# Patient Record
Sex: Female | Born: 1945
Health system: Southern US, Community
[De-identification: ages and names within clinical notes are randomized; demographics above are authoritative.]

## PROBLEM LIST (undated history)

## (undated) DIAGNOSIS — G8929 Other chronic pain: Secondary | ICD-10-CM

## (undated) DIAGNOSIS — R3915 Urgency of urination: Secondary | ICD-10-CM

## (undated) DIAGNOSIS — M179 Osteoarthritis of knee, unspecified: Secondary | ICD-10-CM

## (undated) DIAGNOSIS — K589 Irritable bowel syndrome without diarrhea: Secondary | ICD-10-CM

## (undated) DIAGNOSIS — C50919 Malignant neoplasm of unspecified site of unspecified female breast: Secondary | ICD-10-CM

## (undated) DIAGNOSIS — F32A Depression, unspecified: Secondary | ICD-10-CM

## (undated) DIAGNOSIS — R531 Weakness: Secondary | ICD-10-CM

## (undated) DIAGNOSIS — R609 Edema, unspecified: Secondary | ICD-10-CM

## (undated) DIAGNOSIS — R0600 Dyspnea, unspecified: Secondary | ICD-10-CM

## (undated) DIAGNOSIS — U071 COVID-19: Secondary | ICD-10-CM

## (undated) DIAGNOSIS — Z9221 Personal history of antineoplastic chemotherapy: Secondary | ICD-10-CM

## (undated) DIAGNOSIS — R600 Localized edema: Secondary | ICD-10-CM

## (undated) DIAGNOSIS — R0902 Hypoxemia: Secondary | ICD-10-CM

## (undated) DIAGNOSIS — M549 Dorsalgia, unspecified: Secondary | ICD-10-CM

## (undated) DIAGNOSIS — Z923 Personal history of irradiation: Secondary | ICD-10-CM

## (undated) DIAGNOSIS — G629 Polyneuropathy, unspecified: Secondary | ICD-10-CM

## (undated) DIAGNOSIS — Z9981 Dependence on supplemental oxygen: Secondary | ICD-10-CM

## (undated) DIAGNOSIS — L950 Livedoid vasculitis: Secondary | ICD-10-CM

## (undated) DIAGNOSIS — R351 Nocturia: Secondary | ICD-10-CM

## (undated) DIAGNOSIS — I2510 Atherosclerotic heart disease of native coronary artery without angina pectoris: Secondary | ICD-10-CM

## (undated) DIAGNOSIS — F329 Major depressive disorder, single episode, unspecified: Secondary | ICD-10-CM

## (undated) DIAGNOSIS — K219 Gastro-esophageal reflux disease without esophagitis: Secondary | ICD-10-CM

## (undated) DIAGNOSIS — J189 Pneumonia, unspecified organism: Secondary | ICD-10-CM

## (undated) DIAGNOSIS — L570 Actinic keratosis: Secondary | ICD-10-CM

## (undated) DIAGNOSIS — L821 Other seborrheic keratosis: Secondary | ICD-10-CM

## (undated) DIAGNOSIS — M1710 Unilateral primary osteoarthritis, unspecified knee: Secondary | ICD-10-CM

## (undated) DIAGNOSIS — M2540 Effusion, unspecified joint: Secondary | ICD-10-CM

## (undated) DIAGNOSIS — Z87442 Personal history of urinary calculi: Secondary | ICD-10-CM

## (undated) DIAGNOSIS — F419 Anxiety disorder, unspecified: Secondary | ICD-10-CM

## (undated) DIAGNOSIS — M2550 Pain in unspecified joint: Secondary | ICD-10-CM

## (undated) DIAGNOSIS — R350 Frequency of micturition: Secondary | ICD-10-CM

## (undated) DIAGNOSIS — G4733 Obstructive sleep apnea (adult) (pediatric): Secondary | ICD-10-CM

## (undated) DIAGNOSIS — I10 Essential (primary) hypertension: Secondary | ICD-10-CM

## (undated) DIAGNOSIS — R51 Headache: Secondary | ICD-10-CM

## (undated) DIAGNOSIS — G4730 Sleep apnea, unspecified: Secondary | ICD-10-CM

## (undated) DIAGNOSIS — K635 Polyp of colon: Secondary | ICD-10-CM

## (undated) DIAGNOSIS — R519 Headache, unspecified: Secondary | ICD-10-CM

## (undated) DIAGNOSIS — E785 Hyperlipidemia, unspecified: Secondary | ICD-10-CM

## (undated) DIAGNOSIS — J45909 Unspecified asthma, uncomplicated: Secondary | ICD-10-CM

## (undated) DIAGNOSIS — M069 Rheumatoid arthritis, unspecified: Secondary | ICD-10-CM

## (undated) HISTORY — DX: Essential (primary) hypertension: I10

## (undated) HISTORY — PX: SHOULDER ARTHROSCOPY: SHX128

## (undated) HISTORY — DX: Dependence on supplemental oxygen: Z99.81

## (undated) HISTORY — DX: Major depressive disorder, single episode, unspecified: F32.9

## (undated) HISTORY — DX: Hyperlipidemia, unspecified: E78.5

## (undated) HISTORY — DX: Actinic keratosis: L57.0

## (undated) HISTORY — DX: Unilateral primary osteoarthritis, unspecified knee: M17.10

## (undated) HISTORY — DX: Gastro-esophageal reflux disease without esophagitis: K21.9

## (undated) HISTORY — DX: Livedoid vasculitis: L95.0

## (undated) HISTORY — PX: TOTAL KNEE ARTHROPLASTY: SHX125

## (undated) HISTORY — DX: Other chronic pain: G89.29

## (undated) HISTORY — DX: Polyp of colon: K63.5

## (undated) HISTORY — PX: WRIST SURGERY: SHX841

## (undated) HISTORY — DX: Dyspnea, unspecified: R06.00

## (undated) HISTORY — DX: Sleep apnea, unspecified: G47.30

## (undated) HISTORY — PX: COLONOSCOPY: SHX174

## (undated) HISTORY — PX: FOOT SURGERY: SHX648

## (undated) HISTORY — DX: Hypoxemia: R09.02

## (undated) HISTORY — DX: Unspecified asthma, uncomplicated: J45.909

## (undated) HISTORY — PX: JOINT REPLACEMENT: SHX530

## (undated) HISTORY — PX: TOTAL SHOULDER ARTHROPLASTY: SHX126

## (undated) HISTORY — DX: Obstructive sleep apnea (adult) (pediatric): G47.33

## (undated) HISTORY — DX: Anxiety disorder, unspecified: F41.9

## (undated) HISTORY — DX: Osteoarthritis of knee, unspecified: M17.9

## (undated) HISTORY — PX: OTHER SURGICAL HISTORY: SHX169

## (undated) HISTORY — DX: Atherosclerotic heart disease of native coronary artery without angina pectoris: I25.10

## (undated) HISTORY — DX: Other seborrheic keratosis: L82.1

## (undated) HISTORY — DX: Malignant neoplasm of unspecified site of unspecified female breast: C50.919

## (undated) HISTORY — PX: LITHOTRIPSY: SUR834

## (undated) HISTORY — DX: Depression, unspecified: F32.A

---

## 1995-07-26 HISTORY — PX: BREAST LUMPECTOMY: SHX2

## 1998-02-05 ENCOUNTER — Ambulatory Visit (HOSPITAL_COMMUNITY): Admission: RE | Admit: 1998-02-05 | Discharge: 1998-02-05 | Payer: Self-pay | Admitting: Hematology & Oncology

## 1998-10-14 ENCOUNTER — Ambulatory Visit (HOSPITAL_COMMUNITY): Admission: RE | Admit: 1998-10-14 | Discharge: 1998-10-14 | Payer: Self-pay | Admitting: Hematology & Oncology

## 1999-07-30 ENCOUNTER — Other Ambulatory Visit: Admission: RE | Admit: 1999-07-30 | Discharge: 1999-07-30 | Payer: Self-pay | Admitting: Obstetrics and Gynecology

## 1999-10-08 ENCOUNTER — Encounter: Payer: Self-pay | Admitting: General Surgery

## 1999-10-08 ENCOUNTER — Encounter: Admission: RE | Admit: 1999-10-08 | Discharge: 1999-10-08 | Payer: Self-pay | Admitting: General Surgery

## 1999-11-29 ENCOUNTER — Other Ambulatory Visit: Admission: RE | Admit: 1999-11-29 | Discharge: 1999-11-29 | Payer: Self-pay | Admitting: Obstetrics and Gynecology

## 1999-11-29 ENCOUNTER — Encounter (INDEPENDENT_AMBULATORY_CARE_PROVIDER_SITE_OTHER): Payer: Self-pay | Admitting: Specialist

## 2000-01-31 ENCOUNTER — Encounter: Payer: Self-pay | Admitting: Hematology & Oncology

## 2000-01-31 ENCOUNTER — Encounter: Admission: RE | Admit: 2000-01-31 | Discharge: 2000-01-31 | Payer: Self-pay | Admitting: Hematology & Oncology

## 2000-10-09 ENCOUNTER — Encounter: Admission: RE | Admit: 2000-10-09 | Discharge: 2000-10-09 | Payer: Self-pay | Admitting: General Surgery

## 2000-10-09 ENCOUNTER — Encounter: Payer: Self-pay | Admitting: General Surgery

## 2000-10-19 ENCOUNTER — Other Ambulatory Visit: Admission: RE | Admit: 2000-10-19 | Discharge: 2000-10-19 | Payer: Self-pay | Admitting: Obstetrics and Gynecology

## 2001-01-01 ENCOUNTER — Ambulatory Visit (HOSPITAL_COMMUNITY): Admission: RE | Admit: 2001-01-01 | Discharge: 2001-01-01 | Payer: Self-pay | Admitting: Hematology & Oncology

## 2001-01-01 ENCOUNTER — Encounter: Payer: Self-pay | Admitting: Hematology & Oncology

## 2001-05-16 ENCOUNTER — Encounter (INDEPENDENT_AMBULATORY_CARE_PROVIDER_SITE_OTHER): Payer: Self-pay

## 2001-05-16 ENCOUNTER — Other Ambulatory Visit: Admission: RE | Admit: 2001-05-16 | Discharge: 2001-05-16 | Payer: Self-pay | Admitting: Obstetrics and Gynecology

## 2001-06-26 ENCOUNTER — Encounter: Payer: Self-pay | Admitting: Hematology & Oncology

## 2001-06-26 ENCOUNTER — Encounter: Admission: RE | Admit: 2001-06-26 | Discharge: 2001-06-26 | Payer: Self-pay | Admitting: Hematology & Oncology

## 2001-07-19 ENCOUNTER — Encounter: Admission: RE | Admit: 2001-07-19 | Discharge: 2001-07-19 | Payer: Self-pay | Admitting: Hematology & Oncology

## 2001-07-19 ENCOUNTER — Encounter: Payer: Self-pay | Admitting: Hematology & Oncology

## 2001-08-10 ENCOUNTER — Encounter: Admission: RE | Admit: 2001-08-10 | Discharge: 2001-08-10 | Payer: Self-pay | Admitting: Hematology & Oncology

## 2001-08-10 ENCOUNTER — Encounter: Payer: Self-pay | Admitting: Hematology & Oncology

## 2001-08-24 ENCOUNTER — Encounter: Payer: Self-pay | Admitting: Hematology & Oncology

## 2001-08-24 ENCOUNTER — Encounter: Admission: RE | Admit: 2001-08-24 | Discharge: 2001-08-24 | Payer: Self-pay | Admitting: Hematology & Oncology

## 2001-10-26 ENCOUNTER — Encounter: Payer: Self-pay | Admitting: General Surgery

## 2001-10-26 ENCOUNTER — Encounter: Admission: RE | Admit: 2001-10-26 | Discharge: 2001-10-26 | Payer: Self-pay | Admitting: General Surgery

## 2002-01-14 ENCOUNTER — Other Ambulatory Visit: Admission: RE | Admit: 2002-01-14 | Discharge: 2002-01-14 | Payer: Self-pay | Admitting: Obstetrics and Gynecology

## 2002-02-20 ENCOUNTER — Encounter: Admission: RE | Admit: 2002-02-20 | Discharge: 2002-02-20 | Payer: Self-pay | Admitting: Internal Medicine

## 2002-02-20 ENCOUNTER — Encounter (HOSPITAL_BASED_OUTPATIENT_CLINIC_OR_DEPARTMENT_OTHER): Payer: Self-pay | Admitting: Internal Medicine

## 2002-06-18 ENCOUNTER — Encounter: Payer: Self-pay | Admitting: Hematology & Oncology

## 2002-06-18 ENCOUNTER — Encounter: Admission: RE | Admit: 2002-06-18 | Discharge: 2002-06-18 | Payer: Self-pay | Admitting: Hematology & Oncology

## 2002-09-09 ENCOUNTER — Ambulatory Visit (HOSPITAL_COMMUNITY): Admission: RE | Admit: 2002-09-09 | Discharge: 2002-09-09 | Payer: Self-pay | Admitting: Internal Medicine

## 2002-09-19 ENCOUNTER — Ambulatory Visit (HOSPITAL_COMMUNITY): Admission: RE | Admit: 2002-09-19 | Discharge: 2002-09-19 | Payer: Self-pay | Admitting: Cardiology

## 2002-10-31 ENCOUNTER — Encounter: Admission: RE | Admit: 2002-10-31 | Discharge: 2002-10-31 | Payer: Self-pay | Admitting: General Surgery

## 2002-10-31 ENCOUNTER — Encounter: Payer: Self-pay | Admitting: General Surgery

## 2003-02-14 ENCOUNTER — Other Ambulatory Visit: Admission: RE | Admit: 2003-02-14 | Discharge: 2003-02-14 | Payer: Self-pay | Admitting: Obstetrics and Gynecology

## 2003-07-26 HISTORY — PX: CORONARY ARTERY BYPASS GRAFT: SHX141

## 2003-11-28 ENCOUNTER — Encounter: Admission: RE | Admit: 2003-11-28 | Discharge: 2003-11-28 | Payer: Self-pay | Admitting: General Surgery

## 2004-02-05 ENCOUNTER — Ambulatory Visit (HOSPITAL_COMMUNITY): Admission: RE | Admit: 2004-02-05 | Discharge: 2004-02-05 | Payer: Self-pay | Admitting: Internal Medicine

## 2004-03-12 ENCOUNTER — Other Ambulatory Visit: Admission: RE | Admit: 2004-03-12 | Discharge: 2004-03-12 | Payer: Self-pay | Admitting: Obstetrics and Gynecology

## 2004-04-23 ENCOUNTER — Inpatient Hospital Stay (HOSPITAL_COMMUNITY): Admission: AD | Admit: 2004-04-23 | Discharge: 2004-05-02 | Payer: Self-pay | Admitting: Cardiology

## 2004-04-23 HISTORY — PX: CARDIAC CATHETERIZATION: SHX172

## 2004-06-07 ENCOUNTER — Encounter (HOSPITAL_COMMUNITY): Admission: RE | Admit: 2004-06-07 | Discharge: 2004-09-05 | Payer: Self-pay | Admitting: Cardiology

## 2004-08-20 ENCOUNTER — Ambulatory Visit: Payer: Self-pay | Admitting: Hematology & Oncology

## 2004-12-10 ENCOUNTER — Encounter: Admission: RE | Admit: 2004-12-10 | Discharge: 2004-12-10 | Payer: Self-pay | Admitting: General Surgery

## 2005-04-27 HISTORY — PX: OTHER SURGICAL HISTORY: SHX169

## 2005-06-28 ENCOUNTER — Other Ambulatory Visit: Admission: RE | Admit: 2005-06-28 | Discharge: 2005-06-28 | Payer: Self-pay | Admitting: Obstetrics and Gynecology

## 2005-08-18 ENCOUNTER — Ambulatory Visit: Payer: Self-pay | Admitting: Hematology & Oncology

## 2005-12-16 ENCOUNTER — Encounter: Admission: RE | Admit: 2005-12-16 | Discharge: 2005-12-16 | Payer: Self-pay | Admitting: General Surgery

## 2006-08-25 ENCOUNTER — Ambulatory Visit: Payer: Self-pay | Admitting: Gastroenterology

## 2006-09-08 ENCOUNTER — Ambulatory Visit: Payer: Self-pay | Admitting: Gastroenterology

## 2006-09-12 ENCOUNTER — Ambulatory Visit: Payer: Self-pay | Admitting: Hematology & Oncology

## 2006-09-15 LAB — CBC WITH DIFFERENTIAL/PLATELET
BASO%: 0.4 % (ref 0.0–2.0)
EOS%: 1.1 % (ref 0.0–7.0)
HCT: 40.4 % (ref 34.8–46.6)
LYMPH%: 29.5 % (ref 14.0–48.0)
MCH: 30.6 pg (ref 26.0–34.0)
MCHC: 34.4 g/dL (ref 32.0–36.0)
MONO%: 9.9 % (ref 0.0–13.0)
NEUT%: 59.1 % (ref 39.6–76.8)
Platelets: 208 10*3/uL (ref 145–400)
RBC: 4.54 10*6/uL (ref 3.70–5.32)

## 2006-09-15 LAB — COMPREHENSIVE METABOLIC PANEL
ALT: 22 U/L (ref 0–35)
AST: 17 U/L (ref 0–37)
Calcium: 9.4 mg/dL (ref 8.4–10.5)
Chloride: 105 mEq/L (ref 96–112)
Creatinine, Ser: 0.58 mg/dL (ref 0.40–1.20)
Sodium: 141 mEq/L (ref 135–145)
Total Bilirubin: 0.5 mg/dL (ref 0.3–1.2)
Total Protein: 7 g/dL (ref 6.0–8.3)

## 2006-12-19 ENCOUNTER — Encounter: Admission: RE | Admit: 2006-12-19 | Discharge: 2006-12-19 | Payer: Self-pay | Admitting: General Surgery

## 2007-09-12 ENCOUNTER — Ambulatory Visit: Payer: Self-pay | Admitting: Hematology & Oncology

## 2007-09-14 LAB — CBC WITH DIFFERENTIAL/PLATELET
BASO%: 0.8 % (ref 0.0–2.0)
EOS%: 1.1 % (ref 0.0–7.0)
HCT: 38.5 % (ref 34.8–46.6)
LYMPH%: 37.6 % (ref 14.0–48.0)
MCH: 30.6 pg (ref 26.0–34.0)
MCHC: 34.4 g/dL (ref 32.0–36.0)
MONO#: 0.5 10*3/uL (ref 0.1–0.9)
NEUT%: 53.3 % (ref 39.6–76.8)
RBC: 4.32 10*6/uL (ref 3.70–5.32)
WBC: 6.9 10*3/uL (ref 3.9–10.0)
lymph#: 2.6 10*3/uL (ref 0.9–3.3)

## 2007-09-14 LAB — COMPREHENSIVE METABOLIC PANEL
ALT: 17 U/L (ref 0–35)
AST: 17 U/L (ref 0–37)
Chloride: 105 mEq/L (ref 96–112)
Creatinine, Ser: 0.65 mg/dL (ref 0.40–1.20)
Sodium: 142 mEq/L (ref 135–145)
Total Bilirubin: 0.6 mg/dL (ref 0.3–1.2)
Total Protein: 7 g/dL (ref 6.0–8.3)

## 2007-10-25 ENCOUNTER — Encounter: Admission: RE | Admit: 2007-10-25 | Discharge: 2007-10-25 | Payer: Self-pay | Admitting: Rheumatology

## 2007-12-20 ENCOUNTER — Encounter: Admission: RE | Admit: 2007-12-20 | Discharge: 2007-12-20 | Payer: Self-pay | Admitting: Hematology & Oncology

## 2008-04-03 ENCOUNTER — Encounter: Admission: RE | Admit: 2008-04-03 | Discharge: 2008-04-03 | Payer: Self-pay | Admitting: Internal Medicine

## 2008-09-11 ENCOUNTER — Ambulatory Visit: Payer: Self-pay | Admitting: Hematology & Oncology

## 2008-09-12 LAB — COMPREHENSIVE METABOLIC PANEL
ALT: 19 U/L (ref 0–35)
AST: 16 U/L (ref 0–37)
CO2: 25 mEq/L (ref 19–32)
Calcium: 9.7 mg/dL (ref 8.4–10.5)
Chloride: 105 mEq/L (ref 96–112)
Creatinine, Ser: 0.63 mg/dL (ref 0.40–1.20)
Potassium: 4.3 mEq/L (ref 3.5–5.3)
Sodium: 141 mEq/L (ref 135–145)
Total Protein: 7.3 g/dL (ref 6.0–8.3)

## 2008-09-12 LAB — CBC WITH DIFFERENTIAL (CANCER CENTER ONLY)
BASO%: 0.3 % (ref 0.0–2.0)
EOS%: 1.5 % (ref 0.0–7.0)
HCT: 42.2 % (ref 34.8–46.6)
LYMPH%: 42.2 % (ref 14.0–48.0)
MCHC: 33.7 g/dL (ref 32.0–36.0)
MCV: 92 fL (ref 81–101)
NEUT%: 46.8 % (ref 39.6–80.0)
RDW: 13.5 % (ref 10.5–14.6)

## 2008-12-23 ENCOUNTER — Encounter: Admission: RE | Admit: 2008-12-23 | Discharge: 2008-12-23 | Payer: Self-pay | Admitting: Hematology & Oncology

## 2009-01-06 HISTORY — PX: US ECHOCARDIOGRAPHY: HXRAD669

## 2009-06-16 ENCOUNTER — Ambulatory Visit: Payer: Self-pay | Admitting: Vascular Surgery

## 2009-08-27 ENCOUNTER — Ambulatory Visit: Payer: Self-pay | Admitting: Hematology & Oncology

## 2009-08-28 LAB — CBC WITH DIFFERENTIAL (CANCER CENTER ONLY)
BASO%: 0.7 % (ref 0.0–2.0)
EOS%: 1.6 % (ref 0.0–7.0)
LYMPH%: 38.9 % (ref 14.0–48.0)
MCHC: 33.6 g/dL (ref 32.0–36.0)
MCV: 91 fL (ref 81–101)
NEUT%: 51.3 % (ref 39.6–80.0)
RBC: 4.71 10*6/uL (ref 3.70–5.32)
WBC: 7 10*3/uL (ref 3.9–10.0)

## 2009-08-28 LAB — COMPREHENSIVE METABOLIC PANEL
AST: 18 U/L (ref 0–37)
Alkaline Phosphatase: 46 U/L (ref 39–117)
BUN: 17 mg/dL (ref 6–23)
Calcium: 9.8 mg/dL (ref 8.4–10.5)
Chloride: 102 mEq/L (ref 96–112)
Creatinine, Ser: 0.58 mg/dL (ref 0.40–1.20)
Glucose, Bld: 91 mg/dL (ref 70–99)
Total Bilirubin: 0.4 mg/dL (ref 0.3–1.2)
Total Protein: 6.9 g/dL (ref 6.0–8.3)

## 2009-09-01 ENCOUNTER — Ambulatory Visit (HOSPITAL_COMMUNITY): Admission: RE | Admit: 2009-09-01 | Discharge: 2009-09-01 | Payer: Self-pay | Admitting: Hematology & Oncology

## 2009-09-08 ENCOUNTER — Encounter: Admission: RE | Admit: 2009-09-08 | Discharge: 2009-09-08 | Payer: Self-pay | Admitting: Hematology & Oncology

## 2009-09-10 ENCOUNTER — Ambulatory Visit (HOSPITAL_COMMUNITY)
Admission: RE | Admit: 2009-09-10 | Discharge: 2009-09-10 | Payer: Self-pay | Source: Home / Self Care | Admitting: Hematology & Oncology

## 2010-03-09 ENCOUNTER — Encounter: Admission: RE | Admit: 2010-03-09 | Discharge: 2010-03-09 | Payer: Self-pay | Admitting: Hematology & Oncology

## 2010-07-07 ENCOUNTER — Telehealth (INDEPENDENT_AMBULATORY_CARE_PROVIDER_SITE_OTHER): Payer: Self-pay | Admitting: *Deleted

## 2010-07-07 ENCOUNTER — Ambulatory Visit: Payer: Self-pay | Admitting: Cardiology

## 2010-07-08 ENCOUNTER — Encounter (HOSPITAL_COMMUNITY)
Admission: RE | Admit: 2010-07-08 | Discharge: 2010-08-24 | Payer: Self-pay | Source: Home / Self Care | Attending: Cardiology | Admitting: Cardiology

## 2010-07-08 ENCOUNTER — Encounter: Payer: Self-pay | Admitting: Cardiology

## 2010-07-08 ENCOUNTER — Ambulatory Visit: Payer: Self-pay

## 2010-07-12 ENCOUNTER — Ambulatory Visit: Payer: Self-pay

## 2010-07-28 LAB — DIFFERENTIAL
Basophils Absolute: 0 10*3/uL (ref 0.0–0.1)
Basophils Relative: 0 % (ref 0–1)
Eosinophils Absolute: 0.1 10*3/uL (ref 0.0–0.7)
Eosinophils Relative: 1 % (ref 0–5)
Lymphocytes Relative: 38 % (ref 12–46)
Lymphs Abs: 3.1 10*3/uL (ref 0.7–4.0)
Monocytes Absolute: 0.6 10*3/uL (ref 0.1–1.0)
Monocytes Relative: 8 % (ref 3–12)
Neutro Abs: 4.4 10*3/uL (ref 1.7–7.7)
Neutrophils Relative %: 53 % (ref 43–77)

## 2010-07-28 LAB — COMPREHENSIVE METABOLIC PANEL
ALT: 34 U/L (ref 0–35)
AST: 27 U/L (ref 0–37)
Albumin: 4.2 g/dL (ref 3.5–5.2)
Alkaline Phosphatase: 47 U/L (ref 39–117)
BUN: 13 mg/dL (ref 6–23)
CO2: 30 mEq/L (ref 19–32)
Calcium: 10.2 mg/dL (ref 8.4–10.5)
Chloride: 102 mEq/L (ref 96–112)
Creatinine, Ser: 0.54 mg/dL (ref 0.4–1.2)
GFR calc Af Amer: >60 mL/min (ref >60)
GFR calc non Af Amer: >60 mL/min (ref >60)
Glucose, Bld: 101 mg/dL — ABNORMAL HIGH (ref 70–99)
Potassium: 4 mEq/L (ref 3.5–5.1)
Sodium: 142 mEq/L (ref 135–145)
Total Bilirubin: 0.6 mg/dL (ref 0.3–1.2)
Total Protein: 7.5 g/dL (ref 6.0–8.3)

## 2010-07-28 LAB — URINALYSIS, ROUTINE W REFLEX MICROSCOPIC
Bilirubin Urine: NEGATIVE
Hemoglobin, Urine: NEGATIVE
Ketones, ur: NEGATIVE mg/dL
Nitrite: NEGATIVE
Protein, ur: NEGATIVE mg/dL
Specific Gravity, Urine: 1.022 (ref 1.005–1.030)
Urine Glucose, Fasting: NEGATIVE mg/dL
Urobilinogen, UA: 0.2 mg/dL (ref 0.0–1.0)
pH: 6 (ref 5.0–8.0)

## 2010-07-28 LAB — CBC
HCT: 44.6 % (ref 36.0–46.0)
Hemoglobin: 14.5 g/dL (ref 12.0–15.0)
MCH: 30.5 pg (ref 26.0–34.0)
MCHC: 32.5 g/dL (ref 30.0–36.0)
MCV: 93.7 fL (ref 78.0–100.0)
Platelets: 190 10*3/uL (ref 150–400)
RBC: 4.76 MIL/uL (ref 3.87–5.11)
RDW: 13.7 % (ref 11.5–15.5)
WBC: 8.2 10*3/uL (ref 4.0–10.5)

## 2010-07-28 LAB — APTT: aPTT: 25 seconds (ref 24–37)

## 2010-07-28 LAB — PROTIME-INR
INR: 1.02 (ref 0.00–1.49)
Prothrombin Time: 13.6 seconds (ref 11.6–15.2)

## 2010-07-29 LAB — URINE CULTURE
Colony Count: NO GROWTH
Culture  Setup Time: 201201050114
Culture: NO GROWTH

## 2010-08-05 ENCOUNTER — Inpatient Hospital Stay (HOSPITAL_COMMUNITY)
Admission: RE | Admit: 2010-08-05 | Discharge: 2010-08-09 | Payer: Self-pay | Source: Home / Self Care | Attending: Orthopedic Surgery | Admitting: Orthopedic Surgery

## 2010-08-07 ENCOUNTER — Encounter (INDEPENDENT_AMBULATORY_CARE_PROVIDER_SITE_OTHER): Payer: Self-pay | Admitting: Orthopedic Surgery

## 2010-08-09 LAB — GLUCOSE, CAPILLARY
Glucose-Capillary: 129 mg/dL — ABNORMAL HIGH (ref 70–99)
Glucose-Capillary: 153 mg/dL — ABNORMAL HIGH (ref 70–99)
Glucose-Capillary: 144 mg/dL — ABNORMAL HIGH (ref 70–99)
Glucose-Capillary: 150 mg/dL — ABNORMAL HIGH (ref 70–99)
Glucose-Capillary: 162 mg/dL — ABNORMAL HIGH (ref 70–99)
Glucose-Capillary: 149 mg/dL — ABNORMAL HIGH (ref 70–99)
Glucose-Capillary: 169 mg/dL — ABNORMAL HIGH (ref 70–99)
Glucose-Capillary: 171 mg/dL — ABNORMAL HIGH (ref 70–99)
Glucose-Capillary: 166 mg/dL — ABNORMAL HIGH (ref 70–99)
Glucose-Capillary: 183 mg/dL — ABNORMAL HIGH (ref 70–99)
Glucose-Capillary: 126 mg/dL — ABNORMAL HIGH (ref 70–99)
Glucose-Capillary: 172 mg/dL — ABNORMAL HIGH (ref 70–99)
Glucose-Capillary: 165 mg/dL — ABNORMAL HIGH (ref 70–99)
Glucose-Capillary: 159 mg/dL — ABNORMAL HIGH (ref 70–99)
Glucose-Capillary: 195 mg/dL — ABNORMAL HIGH (ref 70–99)

## 2010-08-09 LAB — BASIC METABOLIC PANEL
BUN: 12 mg/dL (ref 6–23)
BUN: 8 mg/dL (ref 6–23)
CO2: 28 mEq/L (ref 19–32)
CO2: 30 mEq/L (ref 19–32)
Calcium: 8 mg/dL — ABNORMAL LOW (ref 8.4–10.5)
Calcium: 8.4 mg/dL (ref 8.4–10.5)
Chloride: 102 mEq/L (ref 96–112)
Chloride: 103 mEq/L (ref 96–112)
Creatinine, Ser: 0.58 mg/dL (ref 0.4–1.2)
Creatinine, Ser: 0.5 mg/dL (ref 0.4–1.2)
GFR calc Af Amer: >60 mL/min (ref >60)
GFR calc Af Amer: >60 mL/min (ref >60)
GFR calc non Af Amer: >60 mL/min (ref >60)
GFR calc non Af Amer: >60 mL/min (ref >60)
Glucose, Bld: 162 mg/dL — ABNORMAL HIGH (ref 70–99)
Glucose, Bld: 162 mg/dL — ABNORMAL HIGH (ref 70–99)
Potassium: 4.4 mEq/L (ref 3.5–5.1)
Potassium: 3.7 mEq/L (ref 3.5–5.1)
Sodium: 135 mEq/L (ref 135–145)
Sodium: 140 mEq/L (ref 135–145)

## 2010-08-09 LAB — CBC
HCT: 35.2 % — ABNORMAL LOW (ref 36.0–46.0)
HCT: 33.4 % — ABNORMAL LOW (ref 36.0–46.0)
HCT: 31.4 % — ABNORMAL LOW (ref 36.0–46.0)
Hemoglobin: 11.2 g/dL — ABNORMAL LOW (ref 12.0–15.0)
Hemoglobin: 10.7 g/dL — ABNORMAL LOW (ref 12.0–15.0)
Hemoglobin: 10.2 g/dL — ABNORMAL LOW (ref 12.0–15.0)
MCH: 30.4 pg (ref 26.0–34.0)
MCH: 30.1 pg (ref 26.0–34.0)
MCH: 30.5 pg (ref 26.0–34.0)
MCHC: 31.8 g/dL (ref 30.0–36.0)
MCHC: 32 g/dL (ref 30.0–36.0)
MCHC: 32.5 g/dL (ref 30.0–36.0)
MCV: 95.7 fL (ref 78.0–100.0)
MCV: 94.1 fL (ref 78.0–100.0)
MCV: 94 fL (ref 78.0–100.0)
Platelets: 160 10*3/uL (ref 150–400)
Platelets: 131 10*3/uL — ABNORMAL LOW (ref 150–400)
Platelets: 141 10*3/uL — ABNORMAL LOW (ref 150–400)
RBC: 3.68 MIL/uL — ABNORMAL LOW (ref 3.87–5.11)
RBC: 3.55 MIL/uL — ABNORMAL LOW (ref 3.87–5.11)
RBC: 3.34 MIL/uL — ABNORMAL LOW (ref 3.87–5.11)
RDW: 14 % (ref 11.5–15.5)
RDW: 13.8 % (ref 11.5–15.5)
RDW: 14 % (ref 11.5–15.5)
WBC: 11.7 10*3/uL — ABNORMAL HIGH (ref 4.0–10.5)
WBC: 8.4 10*3/uL (ref 4.0–10.5)
WBC: 8.6 10*3/uL (ref 4.0–10.5)

## 2010-08-09 LAB — HEMOGLOBIN A1C
Hgb A1c MFr Bld: 6.5 % — ABNORMAL HIGH (ref <5.7)
Mean Plasma Glucose: 140 mg/dL — ABNORMAL HIGH (ref <117)

## 2010-08-09 LAB — ABO/RH: ABO/RH(D): A POS

## 2010-08-09 LAB — TYPE AND SCREEN
ABO/RH(D): A POS
Antibody Screen: NEGATIVE

## 2010-08-11 LAB — GLUCOSE, CAPILLARY: Glucose-Capillary: 145 mg/dL — ABNORMAL HIGH (ref 70–99)

## 2010-08-19 LAB — SURGICAL PCR SCREEN: MRSA, PCR: NEGATIVE

## 2010-08-23 ENCOUNTER — Ambulatory Visit: Payer: Self-pay | Admitting: Hematology & Oncology

## 2010-08-26 NOTE — Progress Notes (Signed)
Summary: Nuclear Pre-Procedure  Phone Note Outgoing Call   Call placed by: Milana Na, EMT-P,  July 07, 2010 4:14 PM Summary of Call: Left message with information on [employer] Information Sheet (see scanned document for details).      Nuclear Med Background Indications for Stress Test: Evaluation for Ischemia, Surgical Clearance, Graft Patency  Indications Comments: Pending TKR   History: Asthma, CABG  History Comments: '06 Heart Cath/ CABG  07/08 MPS 59%  Symptoms: DOE    Nuclear Pre-Procedure Cardiac Risk Factors: Family History - CAD, Hypertension, Lipids, NIDDM  Nuclear Med Study Referring MD:  P.Jordan

## 2010-08-26 NOTE — Assessment & Plan Note (Signed)
Summary: Cardiology Nuclear Testing  Nuclear Med Background Indications for Stress Test: Evaluation for Ischemia, Surgical Clearance, Graft Patency  Indications Comments: Pending(L) TKR by Dr.John Rendall  History: Asthma, CABG, COPD, Heart Catheterization, History of Chemo, Myocardial Perfusion Study  History Comments: '06 Heart Cath/ CABG  07/08 MPS 59%  Symptoms: Dizziness, DOE, Light-Headedness, Rapid HR  Symptoms Comments: Exertional intermittent throat pain.   Nuclear Pre-Procedure Cardiac Risk Factors: Family History - CAD, Hypertension, Lipids, NIDDM, Obesity Caffeine/Decaff Intake: None NPO After: 7:00 AM Lungs: Clear IV 0.9% NS with Angio Cath: 22g     IV Site: L Forearm IV Started by: Irean Hong, RN Chest Size (in) 44     Cup Size D     Height (in): 66 Weight (lb): 260 BMI: 42.12  Nuclear Med Study 1 or 2 day study:  2 day     Stress Test Type:  Eugenie Birks Reading MD:  Marca Ancona, MD     Referring MD:  P.Jordan Resting Radionuclide:  Technetium 34m Tetrofosmin     Resting Radionuclide Dose:  33.0 mCi  Stress Radionuclide:  Technetium 43m Tetrofosmin     Stress Radionuclide Dose:  33.0 mCi   Stress Protocol      Max HR:  82 bpm     Predicted Max HR:  155 bpm  Max Systolic BP: 119 mm Hg     Percent Max HR:  52.90 %Rate Pressure Product:  9758  Lexiscan: 0.4 mg   Stress Test Technologist:  Irean Hong,  RN     Nuclear Technologist:  Domenic Polite, CNMT  Rest Procedure  Myocardial perfusion imaging was performed at rest 45 minutes following the intravenous administration of Technetium 67m Tetrofosmin.  Stress Procedure  The patient received IV Lexiscan 0.4 mg over 15-seconds.  Technetium 67m Tetrofosmin injected at 30-seconds.  The EKG was nondiagnostic due to baseline T wave changes.  Quantitative spect images were obtained after a 45 minute delay.  QPS Raw Data Images:  Normal; no motion artifact; normal heart/lung ratio. Stress Images:  Normal  homogeneous uptake in all areas of the myocardium. Rest Images:  Normal homogeneous uptake in all areas of the myocardium. Subtraction (SDS):  There is no evidence of scar or ischemia. Transient Ischemic Dilatation:  0.81  (Normal <1.22)  Lung/Heart Ratio:  0.42  (Normal <0.45)  Quantitative Gated Spect Images QGS EDV:  92 ml QGS ESV:  29 ml QGS EF:  69 % QGS cine images:  Normal wall motion.    Overall Impression  Exercise Capacity: Lexiscan with no exercise. BP Response: Normal blood pressure response. Clinical Symptoms: Lightheaded and nauseated.  ECG Impression: Baseline precordial shallow T wave inversions, no change with infusion.  Overall Impression: Normal stress nuclear study.  Appended Document: Cardiology Nuclear Testing copy sent to Dr.Jordan

## 2010-10-01 ENCOUNTER — Other Ambulatory Visit: Payer: Self-pay | Admitting: General Surgery

## 2010-10-01 DIAGNOSIS — Z1231 Encounter for screening mammogram for malignant neoplasm of breast: Secondary | ICD-10-CM

## 2010-10-12 ENCOUNTER — Ambulatory Visit
Admission: RE | Admit: 2010-10-12 | Discharge: 2010-10-12 | Disposition: A | Discharge status: Home / Self Care | Payer: PRIVATE HEALTH INSURANCE | Source: Ambulatory Visit | Attending: General Surgery | Admitting: General Surgery

## 2010-10-12 DIAGNOSIS — Z1231 Encounter for screening mammogram for malignant neoplasm of breast: Secondary | ICD-10-CM

## 2010-12-03 ENCOUNTER — Encounter (INDEPENDENT_AMBULATORY_CARE_PROVIDER_SITE_OTHER): Payer: Self-pay | Admitting: General Surgery

## 2010-12-10 NOTE — Consult Note (Signed)
NAMEGEORGIANNE, [name]                ACCOUNT NO.:  [id number]   MEDICAL RECORD NO.:  [id number]          PATIENT TYPE:  OIB   LOCATION:  3739                         FACILITY:  [facility]   PHYSICIAN:  Salvatore Decent. [name], M.D.DATE OF BIRTH:  03-19-1946   DATE OF CONSULTATION:  04/23/2004  DATE OF DISCHARGE:                                   CONSULTATION   REASON FOR CONSULTATION:  Three-vessel coronary artery disease.   CHIEF COMPLAINT:  Throat pain.   HISTORY OF PRESENT ILLNESS:  [name] is a 65 year old white female with  multiple cardiac risk factors and history of known coronary disease.  She  had catheterization in 2004 which showed 70% OM lesion.  She was treated  medically.  She now presents with exertional throat pain.  This occurs with  walking, going up stairs, particularly if she is carrying any objects any  distance.  She thought she might have reflux, treated herself with Prilosec  and antacids without relief.  She did have relief with rest.  She has not  had any rest or nocturnal symptoms.  She was seen by Dr. Peter Swaziland and  advised to undergo cardiac catheterization.  Today, cardiac catheterization  was done that showed three-vessel disease with a 70 to 80% mid LAD stenosis  and 90% ostial stenosis of large first diagonal, circumflex with subtotal  dominant OM branch.  Her right coronary was a large good quality vessel with  no disease until distally there was total occlusion of a second  posterolateral and 90% lesion distally in the posterior descending.  LV  function was preserved with ejection fraction of 60%.   PAST MEDICAL HISTORY:  1.  Coronary artery disease.  2.  Asthma.  3.  Hyperlipidemia.  4.  Obesity.  5.  History of breast cancer status post lumpectomy, axillary dissection,      and radiation in 1998.  This was on the right breast.  6.  Osteoarthritis.  7.  Hypertension.  8.  Tubal ligation.  9.  Depression.  10. Right wrist surgery.  11.  History of kidney stones.  12. History of gastroesophageal reflux disease.  13. History of a Port-A-Cath.  14. Chronic pain in both the previous breast surgery and previous Port-A-      Cath surgery sites.  15. She also has severe arthritis.   MEDICATIONS:  Please see flow sheet.   ALLERGIES:  She is intolerant of STATINS and ZETIA due to myalgias.   FAMILY HISTORY:  Father had stroke and diabetes.  Mother died at 47 of a  heart attack.  She had two brothers who died of heart attacks, also a  brother who had two bypass surgeries and died of cancer.  She has a sister  with congestive heart failure.   SOCIAL HISTORY:  She is married with two children.  She works for [employer].  She does not smoke or use ethanol.   REVIEW OF SYSTEMS:  She has been feeling weak, tired, and run down.  She has  been having a lot of pain in her legs.  She has been getting injection  therapy of her veins for that.  She does have swelling in her legs, chronic  discoloration of her ankles.  She does not have any claudication type pain.  She does have chronic pain from both her previous mastectomy and Port-A-Cath  incisions as well as at her previous axillary dissection.  She does  occasionally get some pain in her right arm as well.  She does not have the  tendency to bleed or bruise easily.  All other systems are negative.   PHYSICAL EXAMINATION:  GENERAL: [name] is an obese white female in no  acute distress.  Well-developed, well-nourished.  VITAL SIGNS:  Blood pressure 153/110, pulse 78, respirations 16.  She is 5  feet 6 inches tall and 250 pounds.  NEUROLOGIC:  Alert and oriented x 3, grossly intact.  HEENT:  Unremarkable.  NECK:  Supple without thyromegaly, adenopathy, or bruits.  CARDIAC:  Regular rate and rhythm.  Normal S1 and S2.  No rubs, murmurs, or  gallops.  LUNGS:  Clear with no wheezing.  ABDOMEN:  Obese, soft, nontender.  EXTREMITIES:  There are chronic venous stasis changes in  both lower  extremities.  She has 2+ pulses throughout.   LABORATORY AND X-RAY DATA:  Hemoglobin 13.3, hematocrit 40, platelets 203.  PT 12.3, PTT 22.  Sodium 142, potassium 4.0, BUN 15, creatinine 0.6.   EKG showed sinus rhythm.   Chest x-ray showed borderline cardiomegaly.   IMPRESSION:  [name] is an obese 65 year old white female with multiple  medical problems who presents with exertional angina which has been slowly  progressive.  She has not had any rest or nocturnal symptoms.  At  catheterization, she has three-vessel disease, although her disease in her  right coronary involves small vessels distally.  She does have significant  disease in the left anterior descending and circumflex distributions.  She  has preserved left ventricular function.   PLAN:  Coronary artery bypass grafting appears to be the best option for  revascularization of her LAD and circumflex distributions.  I have discussed  with her the indications, risks, benefits, and alternatives.  She  understands the risks include but are not limited to death, stroke, MI, DVT,  PE, bleeding, possible need for transfusions, infections, as well as other  organ system dysfunction including respiratory, renal, or GI complications.  She understands and accepts these risks.   There are two issues that are particularly germane to her case.  One is that  she is very highly likely to have chronic pain issues postop as she has had  chronic pain issues postop with a partial mastectomy and axillary dissection  as well as even with a Port-A-Cath.  Therefore, I think she will be very  likely to have chronic issues with bypass surgery which will probably be of  greater degree of severity than she has experienced previously.  She  understands this.   Secondly, because of her injection therapy for varicose veins, she will need vein mapping bilaterally to assess the availability of conduit before making  a final decision as to  whether or not to proceed with revascularization.       SCH/MEDQ  D:  04/23/2004  T:  04/24/2004  Job:  [id number]   cc:   Peter M. Swaziland, M.D.  [address]  [place], [place] [address]  Fax: [phone]

## 2010-12-10 NOTE — Cardiovascular Report (Signed)
NAMERAENELL, MENSING                ACCOUNT NO.:  [id number]   MEDICAL RECORD NO.:  [id number]          PATIENT TYPE:  OIB   LOCATION:  3739                         FACILITY:  [facility]   PHYSICIAN:  Peter M. Swaziland, M.D.  DATE OF BIRTH:  [birth year]   DATE OF PROCEDURE:  04/23/2004  DATE OF DISCHARGE:                              CARDIAC CATHETERIZATION   INDICATION FOR PROCEDURE:  The patient is a 65 year old white female with  history of hypertension, hyperlipidemia and known coronary disease.  She  presents with unstable angina pectoris.   ACCESS:  Via the right femoral artery using standard Seldinger technique.   PROCEDURE:  Left heart catheterization, coronary and left ventricular  angiography.   EQUIPMENT:  6 French 4 cm right and left Judkins catheter, 6 French pigtail  catheter, 6 French arterial sheath.   MEDICATIONS:  Local anesthesia with 1% Xylocaine.  Versed 2 mg IV.   CONTRAST:  130 mL of Omnipaque.   HEMODYNAMIC DATA:  Aortic pressure 151/82 with mean of 110.  Left  ventricular pressure was 155 with EDP of 17.   ANGIOGRAPHIC DATA:  The left coronary artery arises and distributes  normally.  The left main coronary artery is without significant disease.   The left anterior descending artery is diffusely diseased from the proximal  to mid vessel.  In the mid vessel, there is a more focal 70-80% stenosis.   The first diagonal branch is a large vessel and has a 90% ostial stenosis.  The second diagonal is a very tiny branch and has diffuse 90% disease.   The left circumflex coronary artery gives rise to two very small marginal  vessels, but one predominantly large marginal vessel.  The large marginal  vessel is subtotally occluded proximally.   The right coronary artery is a large dominant vessel.  The proximal, mid and  distal vessel appear normal.   The posterior descending artery has 90% disease distally.   The second posterior lateral branch is subtotally  occluded and fills by  right-to-right collaterals.   LEFT VENTRICULAR ANGIOGRAPHY:  Performed in the RAO view demonstrates normal  left ventricular size and contractility with normal systolic function.  Ejection fraction is estimated at 60%.  There is no mitral regurgitation or  prolapse.   FINAL INTERPRETATION:  1.  Severe three-vessel obstructive atherosclerotic coronary artery disease.      This represents a marked progression compared to prior cardiac      catheterization in February 2004.  2.  Normal left ventricular function.   PLAN:  Recommend CVTS consult.       PMJ/MEDQ  D:  04/23/2004  T:  04/24/2004  Job:  [id number]   cc:   Loraine Leriche A. Waynard Edwards, M.D.  [address]  [place]  [place] [address]  Fax: [phone]   CVTS

## 2010-12-10 NOTE — Op Note (Signed)
[name], [name]                ACCOUNT NO.:  [id number]   MEDICAL RECORD NO.:  [id number]          PATIENT TYPE:  INP   LOCATION:  2308                         FACILITY:  [facility]   PHYSICIAN:  Salvatore Decent. [name], M.D.DATE OF BIRTH:  12-Feb-1946   DATE OF PROCEDURE:  04/28/2004  DATE OF DISCHARGE:                                 OPERATIVE REPORT   PREOPERATIVE DIAGNOSIS:  Severe two vessel coronary artery disease with  exertional angina.   POSTOPERATIVE DIAGNOSIS:  Severe two vessel coronary artery disease with  exertional angina.   PROCEDURE:  Median sternotomy, extracorporeal circulation, coronary artery  bypass grafting x 3 (left internal mammary artery to left anterior  descending, saphenous vein graft to first diagonal, left radial artery to  first obtuse marginal).   SURGEON:  Salvatore Decent. [name], M.D.   ASSISTANT:  Toribio Harbour, N.P.   ANESTHESIA:  General.   FINDINGS:  Posterolateral too small to graft.  LAD deeply intramyocardial.  All coronaries good targets.  Good quality conduits.  Left ventricular  hypertrophy.   CLINICAL NOTE:  [name] is a 65 year old lady with multiple cardiac risk  factors.  She presented with exertional angina and at cardiac  catheterization she had severe two vessel coronary artery disease not  amenable to percutaneous intervention.  The patient was referred for  coronary artery bypass grafting.  The indications, risks, benefits, and  alternative procedures were discussed in detail with the patient.  She  understood and accepted the risks and agreed to proceed.   OPERATIVE NOTE:  [name] was brought to the preop holding area on April 28, 2004.  Lines were placed to monitor arterial, central venous and  pulmonary arterial pressure by the anesthesia service.  Intravenous  antibiotics were administered.  The patient was taken to the operating room,  anesthetized, and intubated.  A Foley catheter was placed.  The chest,  abdomen, legs, and left arm were prepped and draped in the usual fashion.  An incision was made over the volar aspect of the left wrist.  The radial  artery was identified.  The patient had a normal preoperative Allen's test.  The radial was dissected over a short segment, a bulldog clamp was placed  proximally and there was a good pulse distally.  An attempt was made to  harvest the vessel endoscopically, however, there was difficulty with  visualization necessitating open harvest.  The radial artery was a good  quality conduit.  5000 units of heparin was given prior to dividing the  distal end of the radial artery.  There was good back bleeding from the  stump.  The radial artery was inspected to insure all side branches were  clipped then was divided proximally.  Both the proximal and distal stumps  were suture ligated with 4-0 Prolene sutures.  The radial artery was placed  in a papaverine heparinized saline solution.  Next, an incision was made in  the medial aspect of the right leg at the level of the knee.  The greater  saphenous vein was identified and a short segment was harvested  in an open  fashion.  It was of good quality.   A median sternotomy was performed and the left internal mammary artery was  harvested using standard technique.  The mammary artery was of good quality.  The remainder of the full heparin dose was given prior to dividing the  distal end of the mammary artery, there was excellent flow through the cut  end of the vessel.  The pericardium was opened, the ascending aorta was  inspected and palpated, there was no palpable atherosclerotic disease.  The  aorta was cannulated via concentric 2-0 Ethibond pledgeted purse-string  sutures.  A dual stage venous cannula was placed through a purse-string  suture in the right atrial appendage.  Cardiopulmonary bypass was instituted  and the patient was cooled to 32 degrees Celsius.  The coronary arteries  were inspected  and anastomotic sites were chosen.  The conduits were  inspected and cut to length.  A foam pad was placed in the pericardium to  protect the left phrenic nerve.  A temperature probe was placed in the  myocardial septum.  A cardioplegic cannula was placed in the ascending  aorta.  The aorta was crossclamped, the left ventricle was emptied via the  aortic root vent, cardiac arrest was then achieved with a combination of  cold antegrade cardioplegia and topical iced saline.  After achieving a  complete diastolic arrest and myocardial surface temperature of 10 degrees  Celsius, the following distal anastomoses were performed:   First, reversed saphenous vein graft was placed end-to-side to the first  diagonal branch of the LAD, this was a 1.5 mm good quality target, it had a  90% proximal stenosis.  The anastomosis was performed end-to-side with a  running 7-0 Prolene suture.  There was good flow through the anastomosis,  cardioplegia was administered, and there was good hemostasis.   Next, the distal end of the left radial artery was spatulated and was  anastomosed end-to-side to the first obtuse marginal which was a 1.5 mm good  quality target with a running 8-0 Prolene suture.  The anastomosis was  probed easily proximally and distally.  Cardioplegia was administered down  the vein graft and the aortic root, there was good back bleeding from the  radial graft.   Next, the left internal mammary artery was brought through a window in the  pericardium, the distal end was spatulated, it was anastomosed end-to-side  to the LAD.  The LAD was a relatively short vessel and was grafted high  relatively proximally.  The vessel was deeply intramyocardial and it was a  1.8 mm good quality target.  After placing the mammary to LAD anastomosis,  the bulldog clamp was briefly removed from the mammary artery.  Immediate and rapid septal rewarming was noted.  The bulldog clamp was replaced on the   mammary artery.  The mammary pedicle was secured to the epicardial surface  with 6-0 Prolene sutures.  Additional cardioplegia was administered.   The cardioplegia cannula was removed from the ascending aorta and the  proximal radial and vein graft anastomoses were performed to 4 mm punch  aortotomies with running 6-0 Prolene suture for the vein graft and a running  7-0 Prolene suture for the radial artery.  At the completion of the final  proximal anastomosis, the patient was placed in steep Trendelenburg  position.  The aortic root was deaired and the aortic Cross clamp was  removed with total Cross clamp time of 71 minutes.  While  the patient was  being rewarmed, all proximal and distal anastomoses were inspected for  hemostasis.  Epicardial pacing wires were placed on the right ventricle and  right atrium.  When the patient had been rewarmed to a core temperature of  37 degrees Celsius, she was weaned from cardiopulmonary bypass without  difficulty.  Total bypass time was 113 minutes.  Her initial cardiac index  was approximately 2 liters per minute per meter squared and the patient  remained hemodynamically stable throughout post bypass period.   A test dose of protamine was administered and was well tolerated.  The  atrial and aortic cannulae were removed, the remainder of the protamine was  administered without incident.  The chest was irrigated with 1 liter of warm  normal saline containing 1 gram vancomycin.  Hemostasis was achieved.  Left  pleural and two mediastinal chest tubes were placed through separate  subcostal incisions.  The pericardium was reapproximated with interrupted 3-  0 silk sutures and came together easily without tension.  The sternum was  closed with heavy gauge stainless steel wires.  The remainder of the  incision was closed in standard fashion.  Subcuticular closure was used for  the skin.  All sponge,  instrument, and needle counts were correct at the end  of the procedure.  The  patient remained hemodynamically stable throughout the post bypass period  and was taken from the operating room to the surgical intensive care unit in  critical but stable condition.       SCH/MEDQ  D:  04/28/2004  T:  04/28/2004  Job:  [id number]   cc:   Peter M. Swaziland, M.D.  [address]  [place], [place] [address]  Fax: [phone]

## 2010-12-10 NOTE — H&P (Signed)
[name], [name] NO.:  [id number]   MEDICAL RECORD NO.:  [id number]          PATIENT TYPE:  OIB   LOCATION:                               FACILITY:  [facility]   PHYSICIAN:  Peter M. Swaziland, M.D.  DATE OF BIRTH:  08/03/1945   DATE OF ADMISSION:  04/23/2004  DATE OF DISCHARGE:                                HISTORY & PHYSICAL   HISTORY OF PRESENT ILLNESS:  [name] is a 65 year old white female who  has a history of coronary disease.  She presents with a 14-month history of  exertional throat and chest pain.  These symptoms occur with any activity  such as walking, going up stairs, or doing her house work.  They do not  occur at rest.  In fact her symptoms are relieved with rest.  She has tried  increasing doses of Prilosec antacid therapy without relief.  She also  complains of increased symptoms of shortness of breath over the past year.  She did undergo previous cardiac evaluation in January and February of 2004.  Adenosine Cardiolite study at that time suggested anterior wall ischemia.  Subsequent cardiac catheterization demonstrated 95% stenosis in a very small  second diagonal branch.  The first marginal branch had a 70% stenosis. This  was treated medically.  Now because of her increasing symptoms she is  admitted for cardiac catheterization and consideration of intervention.   PAST MEDICAL HISTORY:  1.  Coronary artery disease.  2.  Asthma.  3.  Hyperlipidemia.  4.  Obesity.  5.  History of breast cancer status post partial mastectomy in 1998.  6.  Osteoarthritis.  7.  Hypertension.  8.  Status post tubal ligation.  9.  Depression.  10. History of right wrist surgery.  11. History of kidney stones.  12. Acid reflux disease.   ALLERGIES:  The patient has been intolerant of STATINS AND ZETIA due to  myalgias.   CURRENT MEDICATIONS:  1.  Altace 5 mg daily.  2.  Hydrochlorothiazide 25 mg daily.  3.  Calcium with D daily.  4.  Zantac 75 mg  daily.  5.  Mobick 7.5 mg daily.  6.  Aspirin 81 mg daily.  7.  Potassium 20 mEq daily.  8.  Advair 50/100 1 puff b.i.d.  9.  Multivitamin daily.  10. Cardia XT 240 mg daily.   SOCIAL HISTORY:  The patient is married. She has 2 children. She denies  tobacco or alcohol use. She works for [employer].   FAMILY HISTORY:  Father died of a stroke and diabetes.  Mother died of a  myocardial infarction at age 71. One brother died in his 52's and one  brother at age 45 with myocardial infarction. One brother died with cancer.  One sister has congestive heart failure.   REVIEW OF SYSTEMS:  She has chronic arthralgias in her back and legs.  She  has had no increased edema or claudication symptoms.  No history of TIA or  stroke.  Denies any recent bowel or bladder complaints.  Other review of  systems are  negative.   PHYSICAL EXAMINATION:  GENERAL:  The patient is an obese white female in no  apparent distress. Weight is 251.  VITAL SIGNS:  Blood pressure is 142/80, pulse 60 and regular, respirations  are 20.  HEENT EXAM:  Normocephalic, atraumatic.  Pupils are equal, round and  reactive to light and accommodation.  Extraocular movements are full.  Oropharynx is clear.  NECK:  Supple without JVD, adenopathy, thyromegaly or bruits.  LUNGS:  Clear.  CARDIAC EXAM:  Reveals a regular rate and rhythm without murmurs, rubs, or  gallops.  ABDOMEN:  Obese, soft and nontender. There are no masses or  hepatosplenomegaly.  EXTREMITIES:  Femoral and pedal pulses are 2+ and symmetric. She has no  edema.  NEUROLOGIC EXAM:  Intact.   LABORATORY DATA:  Chest x-ray shows borderline cardiac size, otherwise no  active disease.  ECG is normal.   IMPRESSION:  1.  Symptoms consistent with crescendo angina.  2.  Coronary artery disease.  3.  Obesity.  4.  Hyperlipidemia.  5.  Hypertension.   PLAN:  Will undergo cardiac catheterization with possible intervention  pending these results.         ___________________________________________  Peter M. Swaziland, M.D.    PMJ/MEDQ  D:  04/15/2004  T:  04/15/2004  Job:  [id number]

## 2010-12-10 NOTE — Discharge Summary (Signed)
NAMEJACLYNE, [name]                ACCOUNT NO.:  [id number]   MEDICAL RECORD NO.:  [id number]          PATIENT TYPE:  INP   LOCATION:  2011                         FACILITY:  [facility]   PHYSICIAN:  Salvatore Decent. [name], M.D.DATE OF BIRTH:  [birth year]   DATE OF ADMISSION:  04/23/2004  DATE OF DISCHARGE:  05/02/2004                                 DISCHARGE SUMMARY   ADMISSION DIAGNOSIS:  Severe three-vessel obstructive coronary disease.   PAST MEDICAL HISTORY:  1.  Coronary artery disease, status post catheterization 2004 which revealed      70% OM lesion.  She was treated medically.  2.  Asthma.  3.  Hyperlipidemia.  4.  Obesity.  5.  Breast cancer, status post right breast lumpectomy, axillary dissection      and radiation 1998.  6.  Osteoarthritis.  7.  Hypertension.  8.  Depression.  9.  GERD.  10. Chronic pain in both previous breast surgery and portacath surgery      sites.   OTHER SURGICAL HISTORY:  1.  Tubal ligation.  2.  Right wrist surgery.  3.  She has also had kidney stones in the past.   ALLERGIES:  NO DRUG ALLERGIES, BUT SHE IS INTOLERANT OF STATINS AND ZETIA  DUE TO MYALGIAS.   DISCHARGE DIAGNOSES:  Severe two-vessel coronary artery disease with  exertional angina, status post coronary artery bypass grafting.   BRIEF HISTORY:  [name] is an 64 year old Caucasian female.  She  presented with exertional throat pain.  Initially thought this might be have  been reflux and treated herself with Prilosec without relief.  She was seen  by Dr. Peter Swaziland, who advised her to undergo a cardiac catheterization.  This procedure the risks and benefits were discussed with [name], she  agreed with the plan.   HOSPITAL COURSE:  On April 23, 2004, [name] was admitted to [facility] under the care of Dr. Peter Swaziland.  She underwent cardiac  catheterization.  This revealed severe three-vessel obstructive coronary  artery disease with preserved LV  function.  Ejection fraction is estimated  to be 60%.  As her lesions were not amenable to PCI, cardiac surgery  consultation was requested.  She was seen later in the day by Dr. Charlett Lango.  After examination of the patient and review of all the  available records, Dr. [name] felt her best option was for coronary  bypass surgery.  The procedure, risks and benefits were all discussed with  [name] including that she was likely to have chronic pain issues  postoperatively, as she has had this in the past.  [name] understood all  of this and agree to proceed with surgery.   Preoperative arterial evaluation included carotid duplex scan, this revealed  no significant carotid artery disease.  Upper extremity exam including  Allen's test bilaterally, which were within normal limits.  Next lower  extremity exam, ankle brachial indices greater than 1.0 bilaterally.  Saphenous vein mapping was performed on April 26, 2004.  [name] has a  history of sclerotherapy for varicose  veins.  The bilateral lesser and  greater saphenous veins appear easily compressible in the right thigh.  The  greater saphenous vein in the bilateral calves appeared thrombosed.   [name] remained stable in the hospital awaiting her surgery.   April 27, 2004 [name] underwent the following surgical procedure.  Coronary artery bypass grafting x3.  Grafts placed at the time of the  procedure, left internal mammary artery graft to the left anterior  descending artery, saphenous veins grafted to the diagonal artery, radial  artery was grafted to the obtuse marginal artery.  Vein was harvested from  the distal right thigh with open surgical technique as was the left radial  artery.  Intraoperative findings including that her LAD was deep in the  intramyocardial layer.  PL artery was too small to grasp.  She tolerated  these procedures reasonably well, transferring in stable condition to the  SICU.   She remained hemodynamically stable in the immediate postoperative  period and was extubated several hours after arrival in the intensive care  unit.  She awoke from anesthesia neurologically intact.  She required only  routine care in the intensive care unit.  She did have a PICC line placed in  her left arm for venous access.  [name]'s CBGs have been monitored since  surgery, they have been mildly elevated.  Her fasting blood sugar this  morning, however was 102.  She has been covered only with sliding-scale  insulin.  Her CBGs will continue to be monitored for another 24 hours, if  they remain elevated she will be started on a low-dose oral hypoglycemic.  [name] has also had some mild volume overload, she is responding well to  Lasix.  [name] has made sufficient progress to be transferred out of the  intensive care unit on postoperative day 2.   April 30, 2004, postoperative day 3, [name] is continuing to make good  progress and recovering from her surgery.  On morning rounds this morning,  she reports feeling fairly well.  Vital signs are stable with blood pressure  111/63, she is afebrile, she is continuing to require supplemental oxygen at  2 liters, she is 95% saturated.  Heart has maintained a normal sinus rhythm,  93 beats per minute, her lung sounds are clear today, she is using her  incentive spirometer frequently.  She is eating only small amounts of her  diet, her bowel function has returned.  She is voiding without difficulty.  Her incisions are all healing well, her left hand is neurovascularly intact.  She is ambulating fairly well in the hall, her pain is adequately  controlled.  If [name] continues progress in this manner, it is  anticipated she will be ready for discharge home in by Sunday May 02, 2004.   LABORATORY STUDIES:  April 30, 2004 CBC -- white blood cells 7.1, hemoglobin 8.3, hematocrit 23.8, platelets 138,000.  Chemistries include   sodium 135, potassium 3.6, BUN 8, creatinine 0.5, glucose 102.  Please note  her potassium was supplemented today.  CBGs on April 29, 2004 are  201/131/171.   CONDITION ON DISCHARGE:  Improved.   DISCHARGE MEDICATIONS:  1.  Enteric-coated aspirin 325 mg daily.  2.  Lopressor 25 mg b.i.d.  3.  Altace 5 mg daily.  4.  HCTZ 25 mg daily.  5.  Potassium chloride 20 mEq daily.  6.  Isosorbide mononitrate 30 mg daily for one month.  7.  Prilosec 20  mg daily.  8.  Advair 100/50 1 puff b.i.d.  9.  Multivitamin/B complex vitamin/calcium as before admission.  10. For pain management she may have Tylox 1-2 p.o. q.4 hours p.r.n. for      moderate to severe pain or Tylenol 325 mg 1-2 p.o. q.4 hours for mild      pain.   ACTIVITIES:  She has been asked to refrain from any driving or heavy  lifting, pushing or pulling.  Also instructed to continue her breathing  exercises and daily walking.   DIET:  Should be a carbohydrate modified medium calorie diet as her blood  sugars have been running a little high.   WOUND CARE:  She is to shower daily with mild soap and water.  If her  incisions show any sign of infection, she is to call Dr. Sunday Corn  office.   FOLLOWUP:  1.  Dr. Swaziland should see her back in his office in approximately two weeks.      [name] is asked to call to arrange that appointment and been given      the office phone number.  She will have a chest x-ray taken that day.  2.  Dr. [name] would like to see back at the [facility] office on Tuesday      June 01, 2004 at 2:45 in the afternoon.  She will be asked to bring      her chest x-ray from Dr. Elvis Coil office for Dr. [name] to review.       CTK/MEDQ  D:  04/30/2004  T:  04/30/2004  Job:  [id number]   cc:   Salvatore Decent. [name], M.D.  [address]  [place]  [place] [address]   Peter M. Swaziland, M.D.  [address], [address]  [place], [place] [address]  Fax: [phone]   Redge Gainer. Waynard Edwards, M.D.  [address]  [place]  [place] [address]  Fax: [phone]

## 2011-02-21 ENCOUNTER — Encounter: Payer: Self-pay | Admitting: Cardiology

## 2011-02-22 ENCOUNTER — Encounter: Payer: Self-pay | Admitting: Cardiology

## 2011-02-22 ENCOUNTER — Ambulatory Visit (INDEPENDENT_AMBULATORY_CARE_PROVIDER_SITE_OTHER): Payer: Medicare Other | Admitting: Cardiology

## 2011-02-22 DIAGNOSIS — E119 Type 2 diabetes mellitus without complications: Secondary | ICD-10-CM | POA: Insufficient documentation

## 2011-02-22 DIAGNOSIS — I2510 Atherosclerotic heart disease of native coronary artery without angina pectoris: Secondary | ICD-10-CM

## 2011-02-22 DIAGNOSIS — E785 Hyperlipidemia, unspecified: Secondary | ICD-10-CM

## 2011-02-22 DIAGNOSIS — M1710 Unilateral primary osteoarthritis, unspecified knee: Secondary | ICD-10-CM | POA: Insufficient documentation

## 2011-02-22 DIAGNOSIS — I10 Essential (primary) hypertension: Secondary | ICD-10-CM | POA: Insufficient documentation

## 2011-02-22 NOTE — Assessment & Plan Note (Signed)
Her most recent lab work in April 2012 showed a total cholesterol 238, triglycerides 196, LDL of 155, and HDL of 44. She is intolerant to statins and Zetia because of myalgias. We have stressed the importance of dietary therapy and weight loss.

## 2011-02-22 NOTE — Assessment & Plan Note (Signed)
Nuclear stress testing in December of 2011 was normal with an ejection fraction of 69%. She remains asymptomatic. She will continue her current medical therapy and she is cleared for her right knee replacement.

## 2011-02-22 NOTE — Progress Notes (Signed)
Stevphen Meuse Date of Birth: 1946-07-07   History of Present Illness: Mrs. Maurer is seen for preoperative clearance for knee surgery. She had previous left total knee replacement 6 months ago and now needs her right knee replaced. She has done well from a cardiac standpoint denies any significant chest pain, shortness of breath, or palpitations. Her activity is still limited because of her knee problems. She had no significant difficulty with her prior surgery.  Current Outpatient Prescriptions on File Prior to Visit  Medication Sig Dispense Refill  . ALFALFA PO Take by mouth 2 (two) times daily.        Marland Kitchen amLODipine-benazepril (LOTREL) 5-20 MG per capsule Take 1 capsule by mouth daily.        Marland Kitchen aspirin 81 MG tablet Take 81 mg by mouth daily.        Marland Kitchen b complex vitamins tablet Take 1 tablet by mouth 2 (two) times daily.        Marland Kitchen buPROPion (WELLBUTRIN SR) 150 MG 12 hr tablet Take 150 mg by mouth daily.        . Calcium Carbonate-Vitamin D (CALCIUM + D PO) Take by mouth daily.        . cholecalciferol (VITAMIN D) 1000 UNITS tablet Take 1,000 Units by mouth daily.        . citalopram (CELEXA) 20 MG tablet Take 20 mg by mouth daily.        . Flaxseed, Linseed, (FLAX SEED OIL PO) Take 1,000 mg by mouth 2 (two) times daily.        . furosemide (LASIX) 20 MG tablet Take 20 mg by mouth as needed.        . hydrochlorothiazide 25 MG tablet Take 25 mg by mouth daily.        . meloxicam (MOBIC) 15 MG tablet Take 15 mg by mouth daily.        . mometasone (ASMANEX) 220 MCG/INH inhaler Inhale 2 puffs into the lungs daily.        . Multiple Vitamin (MULTIVITAMIN) tablet Take 1 tablet by mouth daily.        . nebivolol (BYSTOLIC) 10 MG tablet Take 10 mg by mouth daily.        Marland Kitchen POTASSIUM PO Take by mouth daily.        . pregabalin (LYRICA) 50 MG capsule Take 50 mg by mouth daily.        . psyllium (REGULOID) 0.52 G capsule Take 0.52 g by mouth daily.          No Known Allergies  Past Medical History    Diagnosis Date  . Cancer   . Dyspnea   . Asthma   . Coronary artery disease   . Diabetes mellitus   . Hypertension   . Hyperlipidemia   . Obesity   . Breast cancer   . OA (osteoarthritis) of knee   . Depression   . Renal calculi   . GERD (gastroesophageal reflux disease)     Past Surgical History  Procedure Date  . Triple bypass 04/27/05  . Cardiac catheterization 04/23/2004    EF 60%  . Coronary artery bypass graft 2005    LIMA GRAFT TO LAD, SAPHENOUS VEIN GRAFT TO THE FIRST DIAGONAL, AND LEFT RADIAL ARTERY GRAFT TO THE OM  . Breast lumpectomy     RIGHT BREAST  . Tubal ligation   . Wrist surgery   . US echocardiography 01/06/2009    EF 55-60%  . Cardiovascular  stress test 07/12/2010    EF 69%  . Total knee arthroplasty     left    History  Smoking status  . Never Smoker   Smokeless tobacco  . Not on file    History  Alcohol Use No    Family History  Problem Relation Age of Onset  . Heart disease Mother   . Cancer Father   . Cancer Brother   . Heart disease Brother     Review of Systems: As noted history of present illness.  All other systems were reviewed and are negative.  Physical Exam: BP 136/92  Pulse 59  Ht 5\' 6"  (1.676 m)  Wt 262 lb 6.4 oz (119.024 kg)  BMI 42.35 kg/m2 She is a pleasant obese white female in no acute distress. Her HEENT exam is unremarkable. She is normocephalic, atraumatic. Pupils are equal round and reactive to light and accommodation. Extraocular movements are full. Oropharynx is clear. Neck is supple without JVD, adenopathy, thyromegaly, or bruits. Lungs are clear. Cardiac exam reveals a regular rate and rhythm without gallop, murmur, or click. Abdomen is obese, soft, nontender. She has trace lower extremity edema. She has arthritic changes in her knees. She is alert and oriented x3. Cranial nerves II through XII are intact. LABORATORY DATA:  ECG demonstrates normal sinus rhythm with a rate of 59 beats per minute. She has  nonspecific ST abnormality. Assessment / Plan:

## 2011-02-22 NOTE — Patient Instructions (Signed)
We will clear you for your knee surgery.  Continue your current medications.  I will see you again in 6 months.

## 2011-02-22 NOTE — Assessment & Plan Note (Signed)
Blood pressure is well controlled on her current medications. 

## 2011-02-23 ENCOUNTER — Encounter: Payer: Self-pay | Admitting: Cardiology

## 2011-03-21 ENCOUNTER — Encounter (INDEPENDENT_AMBULATORY_CARE_PROVIDER_SITE_OTHER): Payer: Self-pay | Admitting: General Surgery

## 2011-03-21 ENCOUNTER — Ambulatory Visit (INDEPENDENT_AMBULATORY_CARE_PROVIDER_SITE_OTHER): Payer: Medicare Other | Admitting: General Surgery

## 2011-03-21 VITALS — BP 142/88 | HR 62

## 2011-03-21 DIAGNOSIS — Z853 Personal history of malignant neoplasm of breast: Secondary | ICD-10-CM

## 2011-03-21 NOTE — Patient Instructions (Signed)
Return to see me in one year for a physical exam. Get bilateral mammograms on an annual basis, and the next scheduled time should be March of 2013. Please make an appointment with Dr. Arlan Organ for a  check up.

## 2011-03-21 NOTE — Progress Notes (Signed)
Chief Complaint  Patient presents with  . Follow-up    f/u breast reck     HPI [name] is a 65 y.o. female.  This 65 year old Caucasian female underwent right partial mastectomy and right axillary node dissection by Dr. Francina Shelton on Feb. 2, 1998. Pathologic stage of her cancer was T1c., N0, receptor positive. She had adjuvant chemotherapy and radiation therapy.  She has no new complaints about  either breast. Her last mammograms were on October 14, 2010. Lumpectomy changes were noted but otherwise these were BI-RADS category 1.  She sees Dr. Arlan Shelton intermittently. Her primary care physician is Dr. Rodrigo Shelton.   Her cardiologist is Dr. Peter Shelton.HPI  Past Medical History  Diagnosis Date  . Cancer   . Dyspnea   . Asthma   . Coronary artery disease   . Diabetes mellitus   . Hypertension   . Hyperlipidemia   . Obesity   . Breast cancer   . OA (osteoarthritis) of knee   . Depression   . Renal calculi   . GERD (gastroesophageal reflux disease)   . Diabetes mellitus type II     Past Surgical History  Procedure Date  . Triple bypass 04/27/05  . Cardiac catheterization 04/23/2004    EF 60%  . Coronary artery bypass graft 2005    LIMA GRAFT TO LAD, SAPHENOUS VEIN GRAFT TO THE FIRST DIAGONAL, AND LEFT RADIAL ARTERY GRAFT TO THE OM  . Breast lumpectomy     RIGHT BREAST  . Tubal ligation   . Wrist surgery   . US echocardiography 01/06/2009    EF 55-60%  . Cardiovascular stress test 07/12/2010    EF 69%  . Total knee arthroplasty     left    Family History  Problem Relation Age of Onset  . Heart disease Mother   . Cancer Father   . Cancer Brother   . Heart disease Brother     Social History History  Substance Use Topics  . Smoking status: Never Smoker   . Smokeless tobacco: Not on file  . Alcohol Use: No    No Known Allergies  Current Outpatient Prescriptions  Medication Sig Dispense Refill  . ALFALFA PO Take by mouth 2 (two) times daily.          Marland Kitchen amLODipine-benazepril (LOTREL) 5-20 MG per capsule Take 1 capsule by mouth daily.        Marland Kitchen aspirin 81 MG tablet Take 81 mg by mouth daily.        Marland Kitchen b complex vitamins tablet Take 1 tablet by mouth 2 (two) times daily.        Marland Kitchen buPROPion (WELLBUTRIN SR) 150 MG 12 hr tablet Take 150 mg by mouth daily.        . Calcium Carbonate-Vitamin D (CALCIUM + D PO) Take by mouth daily.        . cholecalciferol (VITAMIN D) 1000 UNITS tablet Take 1,000 Units by mouth daily.        . citalopram (CELEXA) 20 MG tablet Take 20 mg by mouth daily.        . Flaxseed, Linseed, (FLAX SEED OIL PO) Take 1,000 mg by mouth 2 (two) times daily.        . furosemide (LASIX) 20 MG tablet Take 20 mg by mouth as needed.        . hydrochlorothiazide 25 MG tablet Take 25 mg by mouth daily.        . meloxicam (MOBIC)  15 MG tablet Take 15 mg by mouth daily.        . mometasone (ASMANEX) 220 MCG/INH inhaler Inhale 2 puffs into the lungs daily.        . Multiple Vitamin (MULTIVITAMIN) tablet Take 1 tablet by mouth daily.        . nebivolol (BYSTOLIC) 10 MG tablet Take 10 mg by mouth daily.        Marland Kitchen POTASSIUM PO Take by mouth daily.        . pregabalin (LYRICA) 50 MG capsule Take 50 mg by mouth daily.        . psyllium (REGULOID) 0.52 G capsule Take 0.52 g by mouth daily.        Marland Kitchen VITAMIN E PO Take by mouth daily.          Review of Systems ROS 10 system review of systems is performed and is negative except as described above. Blood pressure 142/88, pulse 62.  Physical Exam Physical Exam  Constitutional patient looks good. She is overweight. Pleasant.   Neck supple, nontender, no mass, no jugular venous tension.  Lungs clear to auscultation.  Breasts both breasts are examined. In the right breast inferolaterally there is some volume loss and scar and in the axilla there is a scar. There is no palpable mass otherwise. There is no skin change otherwise.. Left breast is soft, a bit larger than the right. No palpable mass  no skin changes no axillary adenopathy. The skin of both nipples looks line. Data Reviewed Old records and recent mammograms are reviewed.  Assessment Invasivre Ductal Carcinoma of the right breast, pathologic stage TI C., N0, receptor positive. No evidence of recurrence 14 years following right breast conservation surgery.  Coronary artery disease, status post coronary artery bypass grafting.  Hypertension  Hyperlipidemia  Degenerative joint disease, status post left total replacement Dr. Brynda Shelton.    Plan    We talked about strategies for long-term followup.  She wants to return to see me in one year.  She was encouraged to see Dr. Arlan Shelton for medical oncology followup.  She will get mammograms in March of 2013.       [name] 03/21/2011, 12:42 PM

## 2011-03-30 ENCOUNTER — Telehealth: Payer: Self-pay | Admitting: Cardiology

## 2011-03-30 NOTE — Telephone Encounter (Signed)
Faxed last OV Note, EKG, Xray, ECHO, and Stress Test Results today to [phone]

## 2011-03-30 NOTE — Telephone Encounter (Signed)
[name] from [facility] is requesting  Last ov, ekg, stress, echo, xray

## 2011-04-08 ENCOUNTER — Other Ambulatory Visit: Payer: Self-pay | Admitting: Orthopedic Surgery

## 2011-04-08 ENCOUNTER — Encounter (HOSPITAL_COMMUNITY): Payer: Medicare Other

## 2011-04-08 LAB — CBC
MCV: 90.3 fL (ref 78.0–100.0)
Platelets: 162 10*3/uL (ref 150–400)
RDW: 13.9 % (ref 11.5–15.5)
WBC: 6.2 10*3/uL (ref 4.0–10.5)

## 2011-04-08 LAB — DIFFERENTIAL
Basophils Absolute: 0 10*3/uL (ref 0.0–0.1)
Basophils Relative: 0 % (ref 0–1)
Eosinophils Absolute: 0.1 10*3/uL (ref 0.0–0.7)
Eosinophils Relative: 1 % (ref 0–5)

## 2011-04-08 LAB — URINALYSIS, ROUTINE W REFLEX MICROSCOPIC
Bilirubin Urine: NEGATIVE
Nitrite: NEGATIVE
Specific Gravity, Urine: 1.034 — ABNORMAL HIGH (ref 1.005–1.030)
Urobilinogen, UA: 0.2 mg/dL (ref 0.0–1.0)

## 2011-04-08 LAB — COMPREHENSIVE METABOLIC PANEL
Albumin: 4.1 g/dL (ref 3.5–5.2)
BUN: 16 mg/dL (ref 6–23)
Chloride: 102 mEq/L (ref 96–112)
Creatinine, Ser: <0.47 mg/dL — ABNORMAL LOW (ref 0.50–1.10)
Total Bilirubin: 0.4 mg/dL (ref 0.3–1.2)

## 2011-04-08 LAB — SURGICAL PCR SCREEN
MRSA, PCR: NEGATIVE
Staphylococcus aureus: NEGATIVE

## 2011-04-08 LAB — PROTIME-INR: INR: 1.07 (ref 0.00–1.49)

## 2011-04-14 ENCOUNTER — Inpatient Hospital Stay (HOSPITAL_COMMUNITY)
Admission: RE | Admit: 2011-04-14 | Discharge: 2011-04-18 | DRG: 470 | Disposition: A | Discharge status: Skilled Nursing Facility | Payer: Medicare Other | Source: Ambulatory Visit | Attending: Orthopedic Surgery | Admitting: Orthopedic Surgery

## 2011-04-14 DIAGNOSIS — M1710 Unilateral primary osteoarthritis, unspecified knee: Principal | ICD-10-CM | POA: Diagnosis present

## 2011-04-14 DIAGNOSIS — Z951 Presence of aortocoronary bypass graft: Secondary | ICD-10-CM

## 2011-04-14 DIAGNOSIS — E119 Type 2 diabetes mellitus without complications: Secondary | ICD-10-CM | POA: Diagnosis present

## 2011-04-14 DIAGNOSIS — D62 Acute posthemorrhagic anemia: Secondary | ICD-10-CM | POA: Diagnosis not present

## 2011-04-14 DIAGNOSIS — Z01812 Encounter for preprocedural laboratory examination: Secondary | ICD-10-CM | POA: Insufficient documentation

## 2011-04-14 DIAGNOSIS — I10 Essential (primary) hypertension: Secondary | ICD-10-CM | POA: Diagnosis present

## 2011-04-14 DIAGNOSIS — Z853 Personal history of malignant neoplasm of breast: Secondary | ICD-10-CM

## 2011-04-14 DIAGNOSIS — F3289 Other specified depressive episodes: Secondary | ICD-10-CM | POA: Diagnosis present

## 2011-04-14 DIAGNOSIS — I739 Peripheral vascular disease, unspecified: Secondary | ICD-10-CM | POA: Diagnosis present

## 2011-04-14 DIAGNOSIS — F329 Major depressive disorder, single episode, unspecified: Secondary | ICD-10-CM | POA: Diagnosis present

## 2011-04-14 DIAGNOSIS — J45909 Unspecified asthma, uncomplicated: Secondary | ICD-10-CM | POA: Diagnosis present

## 2011-04-14 DIAGNOSIS — IMO0002 Reserved for concepts with insufficient information to code with codable children: Secondary | ICD-10-CM | POA: Diagnosis present

## 2011-04-14 DIAGNOSIS — I2510 Atherosclerotic heart disease of native coronary artery without angina pectoris: Secondary | ICD-10-CM | POA: Diagnosis present

## 2011-04-14 DIAGNOSIS — K5900 Constipation, unspecified: Secondary | ICD-10-CM | POA: Diagnosis not present

## 2011-04-14 LAB — GLUCOSE, CAPILLARY
Glucose-Capillary: 128 mg/dL — ABNORMAL HIGH (ref 70–99)
Glucose-Capillary: 137 mg/dL — ABNORMAL HIGH (ref 70–99)

## 2011-04-15 LAB — BASIC METABOLIC PANEL
BUN: 15 mg/dL (ref 6–23)
Chloride: 101 mEq/L (ref 96–112)
Glucose, Bld: 131 mg/dL — ABNORMAL HIGH (ref 70–99)
Potassium: 3.9 mEq/L (ref 3.5–5.1)
Sodium: 136 mEq/L (ref 135–145)

## 2011-04-15 LAB — CBC
HCT: 35.3 % — ABNORMAL LOW (ref 36.0–46.0)
Hemoglobin: 11.6 g/dL — ABNORMAL LOW (ref 12.0–15.0)
RDW: 14.2 % (ref 11.5–15.5)
WBC: 8.3 10*3/uL (ref 4.0–10.5)

## 2011-04-15 LAB — GLUCOSE, CAPILLARY: Glucose-Capillary: 139 mg/dL — ABNORMAL HIGH (ref 70–99)

## 2011-04-15 LAB — HEMOGLOBIN A1C
Hgb A1c MFr Bld: 6.5 % — ABNORMAL HIGH (ref <5.7)
Mean Plasma Glucose: 140 mg/dL — ABNORMAL HIGH (ref <117)

## 2011-04-16 LAB — GLUCOSE, CAPILLARY
Glucose-Capillary: 159 mg/dL — ABNORMAL HIGH (ref 70–99)
Glucose-Capillary: 135 mg/dL — ABNORMAL HIGH (ref 70–99)
Glucose-Capillary: 113 mg/dL — ABNORMAL HIGH (ref 70–99)

## 2011-04-16 LAB — BASIC METABOLIC PANEL
Calcium: 8.6 mg/dL (ref 8.4–10.5)
Glucose, Bld: 151 mg/dL — ABNORMAL HIGH (ref 70–99)
Potassium: 3.6 mEq/L (ref 3.5–5.1)
Sodium: 136 mEq/L (ref 135–145)

## 2011-04-16 LAB — CBC
Hemoglobin: 10.6 g/dL — ABNORMAL LOW (ref 12.0–15.0)
MCH: 29.7 pg (ref 26.0–34.0)
MCHC: 32.2 g/dL (ref 30.0–36.0)
Platelets: 141 10*3/uL — ABNORMAL LOW (ref 150–400)

## 2011-04-17 LAB — CBC
MCH: 30 pg (ref 26.0–34.0)
MCHC: 33 g/dL (ref 30.0–36.0)
MCV: 90.9 fL (ref 78.0–100.0)
Platelets: 191 10*3/uL (ref 150–400)
RBC: 3.4 MIL/uL — ABNORMAL LOW (ref 3.87–5.11)
RDW: 14.3 % (ref 11.5–15.5)

## 2011-04-17 LAB — GLUCOSE, CAPILLARY: Glucose-Capillary: 138 mg/dL — ABNORMAL HIGH (ref 70–99)

## 2011-04-18 ENCOUNTER — Inpatient Hospital Stay (HOSPITAL_COMMUNITY): Payer: Medicare Other

## 2011-04-18 LAB — BASIC METABOLIC PANEL
CO2: 29 mEq/L (ref 19–32)
Calcium: 8.7 mg/dL (ref 8.4–10.5)
Chloride: 103 mEq/L (ref 96–112)
Creatinine, Ser: <0.47 mg/dL — ABNORMAL LOW (ref 0.50–1.10)
Glucose, Bld: 162 mg/dL — ABNORMAL HIGH (ref 70–99)

## 2011-04-18 LAB — GLUCOSE, CAPILLARY
Glucose-Capillary: 161 mg/dL — ABNORMAL HIGH (ref 70–99)
Glucose-Capillary: 176 mg/dL — ABNORMAL HIGH (ref 70–99)

## 2011-04-27 NOTE — Op Note (Signed)
[name], [name]                ACCOUNT NO.:  [id number]  MEDICAL RECORD NO.:  [id number]  LOCATION:  1601                         FACILITY:  [facility]  PHYSICIAN:  Jaecob Lowden L. Rendall, M.D.  DATE OF BIRTH:  1945/07/26  DATE OF PROCEDURE:  04/14/2011 DATE OF DISCHARGE:                              OPERATIVE REPORT   PREOPERATIVE DIAGNOSIS:  Osteoarthritis, right knee.  POSTOPERATIVE DIAGNOSIS:  Osteoarthritis, right knee.  SURGICAL PROCEDURE:  Right low contact stress total knee arthroplasty with computer navigation assistance.  SURGEON:  Beth[place]hem Langstaff L. Rendall, M.D.  ASSISTANT:  Legrand Pitts. Duffy, PAC  ANESTHESIA:  General with femoral nerve block.  PATHOLOGY:  The patient is a 65 year old female who was already had her left total knee.  The right knee is in valgus.  It hurts with every step.  All conservative measures have been tried and is bone against bone there, which is very painful and grinds with weightbearing.  PROCEDURE:  Under general anesthesia, the right leg was prepared with DuraPrep and draped as a sterile field with a sterile tourniquet applied on the proximal thigh.  Legs wrapped out with the Esmarch, and the tourniquet is elevated at 350 mm.  Midline incision was made.  The patella was everted.  Femur is sized at a standard right.  It was debrided in preparation for computer mapping.  Once debridement was completed, 2 Schanz pins were placed through the skin and the superior medial tibia and in the distal femur.  The arrays were set up.  The femoral head is identified, and the medial and lateral malleoli mapping of the proximal and distal femur are completed within 1 mm of reproducible accuracy.  Using the first computer guide, the proximal tibial resection is carried out within 1 degree of anatomic alignment from the hip to the ankle.  The balancer is then used and the valgus deformity was corrected to within 1 degree of anatomic alignment.  The tensioner is then used  to check the flexion gap as well.  Using the first femoral guide with the computer, the anterior and posterior flares of the distal femur are resected and then the distal femoral cut is made.  These were both with 12.5 gaps, balanced.  At this point, the lamina spreader is inserted.  Remnants of the menisci and cruciate ligaments were resected.  Spurs were taken off the back of the femoral condyles, and a large fabella is excised from behind the knee.  Once this was completed, the recessing guide is used.  Once it is done, the proximal tibia is exposed with McHale and Linus Orn, it is a size 3. Because she is a large woman, the MBT revision tray is used, which will give better stability through the stress of a bigger patient.  Size 3 tibia with a 12.5 Bearing gives excellent fit, alignment, and stability. We attempted a 15, but it was too tight in extension and felt to be overstuffing the joint, went back to the 15.  The patella was osteotomized.  3 peg hole patella was used with all components appropriately sized.  Permanent ones were obtained.  Bony surface was prepared with pulse irrigation.  All components were  then cemented in place.  The tourniquet is let down once it is hardened at approximately an hour and 8 minutes.  Multiple small vessels were cauterized and extra cement was removed.  Medium Hemovac drain is inserted.  The knee was then closed in layers with #1 Tycron, 0 Vicryl, 2-0 Vicryl, and skin clips.  Operative time, approximately an hour and 25-30 minutes.     Windy Dudek L. Priscille Kluver, M.D.     Renato Gails  D:  04/14/2011  T:  04/14/2011  Job:  [id number]  Electronically Signed by Erasmo Leventhal M.D. on 04/27/2011 02:05:22 PM

## 2011-05-05 NOTE — Discharge Summary (Signed)
NAMEMARJA, ADDERLEY                ACCOUNT NO.:  [id number]  MEDICAL RECORD NO.:  [id number]  LOCATION:  1601                         FACILITY:  [facility]  PHYSICIAN:  Legrand Pitts. Duffy, P.A.   DATE OF BIRTH:  1946-05-22  DATE OF ADMISSION:  04/14/2011 DATE OF DISCHARGE:  04/18/2011                              DISCHARGE SUMMARY   ADMISSION DIAGNOSES: 1. End-stage osteoarthritis right knee, status post left total knee     arthroplasty. 2. Hypertension. 3. Coronary artery disease with history of coronary artery bypass     grafting. 4. Depression. 5. Asthma. 6. Type 2 diabetes mellitus. 7. Chronic low back pain. 8. Morbid obesity. 9. Peripheral vascular disease. 10.Degenerative disk disease, cervical spine. 11.History of breast cancer.  DISCHARGE DIAGNOSES: 1. End-stage osteoarthritis right knee, status post right total knee     arthroplasty. 2. Acute blood loss anemia secondary to surgery. 3. Constipation, now resolved. 4. History of left total knee arthroplasty. 5. Hypertension. 6. Coronary artery disease with history of coronary artery bypass     grafting. 7. Depression. 8. Asthma. 9. Type 2 diabetes mellitus. 10.Chronic low back pain. 11.Morbid obesity. 12.Peripheral vascular disease. 13.Degenerative disk disease, cervical spine. 14.History of breast cancer.  SURGICAL PROCEDURES:  On April 14, 2011, Ms. Frede underwent a right total knee arthroplasty with computer navigation by Dr. Jonny Ruiz L. Rendall assisted by Arnoldo Morale, PA-C.  She had a DePuy LCS complete metal backed patella size standard, cemented, placed with a primary femoral component cemented size standard right.  Tibial tray rotating platform NBT revision size three cemented and an LCS complete tibial insert rotating platform 12.5-mm thickness standard.  COMPLICATIONS:  None.  CONSULT: 1. Social Work consult, April 14, 2011. 2. Physical Therapy consult, April 15, 2011. 3. Occupational  Therapy consult, April 16, 2011.  HISTORY OF PRESENT ILLNESS:  This 65 year old white female patient presented with history of a left total knee in January 2012, now with a 5 to 10-year history of gradual onset progressive right knee pain.  She has had no injury or prior surgery to the right knee, but the pain is constant with weightbearing and sharp sensation over the lateral joint with occasional radiation into the hip.  Pain increases with weightbearing and activity and decreases with rest.  Lobac provides minimal relief, but the knee pops, catches, gives way, and keeps her up at night.  She has failed cortisone injections and oral NSAIDs.  X-rays showed narrowing in the lateral compartment with large spurs on the patella, tibia, and femur.  Because of that, she is presenting for a right knee replacement.  HOSPITAL COURSE:  Ms. Hornung tolerated her surgical procedure well without immediate postoperative complications.  She was transferred to the orthopedic floor.  On postop day #1, she was afebrile and vitals were stable.  Hemoglobin 11.6 and hematocrit 35.3.  She tolerated therapy.  She was weaned off her oxygen.  Plans were made for switched to p.o. pain medicine the next day and she was started on therapy.  Plan was for a probable skilled transfer on Monday.  On postop day #2, hemoglobin 10.6 and hematocrit 32.9.  She was tolerating the CPM and continued to  make good progress over the next several days.  On September 24, she is having some complaints of wheezing.  Her IV fluids are discontinued at this time, they were not discontinued over the weekend.  She is voiding frequently.  BMET will be ordered as well as a chest x-ray.  Respiratory therapy will be given; and if that is stable with no signs of any infiltrate, she will be able to be discharged to a skilled facility later today.  DISCHARGE INSTRUCTIONS:  Diet:  She is to resume her regular prehospitalization diet.  She  is a diet controlled diabetic, so she has been on medium carbohydrate modified diet. Medications:  Please see the patient med discharge instruction sheet for complete documentation of medications.  New medications we placed her on Celebrex, Robaxin, Percocet, and Xarelto.  Currently on hold is her Tylenol, Mobic, aspirin, and sulfa. Activity:  She can be out of bed weightbearing as tolerated on the right leg with use of a walker.  No lifting or driving for 6 weeks.  Please see the right total joint discharge sheet for further activity instructions. Wound Care:  She has a Mepilex dressing in place on her knee.  This can remain in place until about Wednesday of this week.  After that, it can be removed.  The incision painted with Betadine and a dry gauze applied with no tape, using the TED stocking only to hold the dressing in place. Please see the right total joint discharge sheet for further wound care instructions. Followup:  She is to follow up with Dr. Priscille Kluver in our office on Tuesday, September 2, and needs to call [phone] for that appointment.  LABORATORY DATA:  Hemoglobin and hematocrit have ranged from 11.6 and 35.3 on the 21st to 10.2 and 30.9 on the 23rd.  Platelets went from 153 on the 21st to 141 on the 22nd to 191 on the 23rd.  CBGs have ranged from 113 to a high of 161.  BUN and creatinine has been low at less than 0.47 on the 21st and the 22nd.  Hemoglobin A1c on the 21st was 6.5%.  All other laboratory studies were within normal limits.     Legrand Pitts Duffy, P.A.     KED/MEDQ  D:  04/18/2011  T:  04/18/2011  Job:  [id number]  Electronically Signed by Otilio Jefferson. on 04/21/2011 09:39:32 AM Electronically Signed by Erasmo Leventhal M.D. on 05/05/2011 09:55:35 AM

## 2011-08-30 ENCOUNTER — Ambulatory Visit (INDEPENDENT_AMBULATORY_CARE_PROVIDER_SITE_OTHER): Payer: Medicare Other | Admitting: Cardiology

## 2011-08-30 ENCOUNTER — Encounter: Payer: Self-pay | Admitting: Cardiology

## 2011-08-30 VITALS — BP 128/78 | HR 68 | Ht 66.0 in | Wt 253.4 lb

## 2011-08-30 DIAGNOSIS — I10 Essential (primary) hypertension: Secondary | ICD-10-CM

## 2011-08-30 DIAGNOSIS — I2510 Atherosclerotic heart disease of native coronary artery without angina pectoris: Secondary | ICD-10-CM

## 2011-08-30 DIAGNOSIS — E785 Hyperlipidemia, unspecified: Secondary | ICD-10-CM

## 2011-08-30 NOTE — Assessment & Plan Note (Signed)
Blood pressure is under excellent control. 

## 2011-08-30 NOTE — Assessment & Plan Note (Signed)
She is intolerant to multiple lipid-lowering drugs. She is currently trying flax seed oil.

## 2011-08-30 NOTE — Patient Instructions (Signed)
Continue your current medications and exercise.  I will see you again in 6 months.   

## 2011-08-30 NOTE — Progress Notes (Signed)
[name] Date of Birth: 29-Apr-1946   History of Present Illness: [name] is seen for followup visit today. She has had bilateral total knee replacements this past year. She had no complications. She still complains of significant right knee pain. She is able to ride her exercise bike. She has lost 9 pounds since her last visit. She still complains of some chronic shortness of breath but denies any chest pain.  Current Outpatient Prescriptions on File Prior to Visit  Medication Sig Dispense Refill  . ALFALFA PO Take by mouth 2 (two) times daily.        Marland Kitchen amLODipine-benazepril (LOTREL) 5-20 MG per capsule Take 1 capsule by mouth daily.        Marland Kitchen aspirin 81 MG tablet Take 81 mg by mouth daily.        Marland Kitchen b complex vitamins tablet Take 1 tablet by mouth 2 (two) times daily.        Marland Kitchen buPROPion (WELLBUTRIN SR) 150 MG 12 hr tablet Take 150 mg by mouth daily.        . Calcium Carbonate-Vitamin D (CALCIUM + D PO) Take by mouth daily.        . cholecalciferol (VITAMIN D) 1000 UNITS tablet Take 1,000 Units by mouth daily.        . citalopram (CELEXA) 20 MG tablet Take 20 mg by mouth daily.        . Flaxseed, Linseed, (FLAX SEED OIL PO) Take 1,000 mg by mouth 2 (two) times daily.        . furosemide (LASIX) 20 MG tablet Take 20 mg by mouth as needed.        . hydrochlorothiazide 25 MG tablet Take 25 mg by mouth daily.        . meloxicam (MOBIC) 15 MG tablet Take 15 mg by mouth daily.        . mometasone (ASMANEX) 220 MCG/INH inhaler Inhale 2 puffs into the lungs daily.        . Multiple Vitamin (MULTIVITAMIN) tablet Take 1 tablet by mouth daily.        . nebivolol (BYSTOLIC) 10 MG tablet Take 10 mg by mouth daily.        Marland Kitchen POTASSIUM PO Take by mouth daily.        . pregabalin (LYRICA) 50 MG capsule Take 50 mg by mouth daily.        . psyllium (REGULOID) 0.52 G capsule Take 0.52 g by mouth daily.        Marland Kitchen VITAMIN E PO Take by mouth daily.          No Known Allergies  Past Medical History    Diagnosis Date  . Cancer   . Dyspnea   . Asthma   . Coronary artery disease   . Diabetes mellitus   . Hypertension   . Hyperlipidemia   . Obesity   . Breast cancer   . OA (osteoarthritis) of knee   . Depression   . Renal calculi   . GERD (gastroesophageal reflux disease)   . Diabetes mellitus type II     Past Surgical History  Procedure Date  . Triple bypass 04/27/05  . Cardiac catheterization 04/23/2004    EF 60%  . Coronary artery bypass graft 2005    LIMA GRAFT TO LAD, SAPHENOUS VEIN GRAFT TO THE FIRST DIAGONAL, AND LEFT RADIAL ARTERY GRAFT TO THE OM  . Breast lumpectomy     RIGHT BREAST  . Tubal  ligation   . Wrist surgery   . US echocardiography 01/06/2009    EF 55-60%  . Cardiovascular stress test 07/12/2010    EF 69%  . Total knee arthroplasty     left    History  Smoking status  . Never Smoker   Smokeless tobacco  . Not on file    History  Alcohol Use No    Family History  Problem Relation Age of Onset  . Heart disease Mother   . Cancer Father   . Cancer Brother   . Heart disease Brother     Review of Systems: As noted history of present illness.  All other systems were reviewed and are negative.  Physical Exam: BP 128/78  Pulse 68  Ht 5\' 6"  (1.676 m)  Wt 253 lb 6.4 oz (114.941 kg)  BMI 40.90 kg/m2 She is a pleasant obese white female in no acute distress. Her HEENT exam is unremarkable. She is normocephalic, atraumatic. Pupils are equal round and reactive to light and accommodation. Extraocular movements are full. Oropharynx is clear. Neck is supple without JVD, adenopathy, thyromegaly, or bruits. Lungs are clear. Cardiac exam reveals a regular rate and rhythm without gallop, murmur, or click. Abdomen is obese, soft, nontender. She has trace lower extremity edema. She has arthritic changes in her knees. She is alert and oriented x3. Cranial nerves II through XII are intact. LABORATORY DATA:   Assessment / Plan:

## 2011-08-30 NOTE — Assessment & Plan Note (Signed)
She is having no significant anginal symptoms. Her last nuclear stress test in December of 2011 was normal. She does have chronic dyspnea but I think this is more related to deconditioning. I have encouraged her with her weight loss. She is to continue with her daily exercise. I will followup again in 6 months.

## 2011-12-07 ENCOUNTER — Other Ambulatory Visit (INDEPENDENT_AMBULATORY_CARE_PROVIDER_SITE_OTHER): Payer: Self-pay | Admitting: General Surgery

## 2011-12-07 DIAGNOSIS — Z1231 Encounter for screening mammogram for malignant neoplasm of breast: Secondary | ICD-10-CM

## 2011-12-21 ENCOUNTER — Ambulatory Visit: Payer: Medicare Other

## 2011-12-28 ENCOUNTER — Ambulatory Visit
Admission: RE | Admit: 2011-12-28 | Discharge: 2011-12-28 | Disposition: A | Discharge status: Home / Self Care | Payer: Medicare Other | Source: Ambulatory Visit | Attending: General Surgery | Admitting: General Surgery

## 2011-12-28 DIAGNOSIS — Z1231 Encounter for screening mammogram for malignant neoplasm of breast: Secondary | ICD-10-CM

## 2012-01-02 ENCOUNTER — Ambulatory Visit (HOSPITAL_BASED_OUTPATIENT_CLINIC_OR_DEPARTMENT_OTHER): Payer: Medicare Other | Admitting: Hematology & Oncology

## 2012-01-02 ENCOUNTER — Other Ambulatory Visit: Payer: Medicare Other | Admitting: Lab

## 2012-01-02 VITALS — BP 142/76 | HR 58 | Temp 97.6°F | Ht 66.0 in | Wt 260.0 lb

## 2012-01-02 DIAGNOSIS — M79609 Pain in unspecified limb: Secondary | ICD-10-CM

## 2012-01-02 DIAGNOSIS — C50919 Malignant neoplasm of unspecified site of unspecified female breast: Secondary | ICD-10-CM

## 2012-01-02 DIAGNOSIS — E119 Type 2 diabetes mellitus without complications: Secondary | ICD-10-CM

## 2012-01-02 DIAGNOSIS — G8929 Other chronic pain: Secondary | ICD-10-CM

## 2012-01-02 DIAGNOSIS — M810 Age-related osteoporosis without current pathological fracture: Secondary | ICD-10-CM

## 2012-01-02 NOTE — Progress Notes (Signed)
This office note has been dictated.

## 2012-01-03 ENCOUNTER — Telehealth: Payer: Self-pay | Admitting: Hematology & Oncology

## 2012-01-03 NOTE — Progress Notes (Signed)
CC:   Mark A. Perini, M.D.  DIAGNOSIS:  Stage I (T1 N0 M0) ductal carcinoma of the right breast.  CURRENT THERAPY:  Observation.  INTERIM HISTORY:  Ms. Convery comes in for followup.  We have not seen her now for 2 years.  She missed her appointment last year because of bilateral knee surgery.  She is doing better with her knees.  She still has her litany of issues.  She still has problems pain-wise.  She has pain in the right arm.  This is the right upper arm.  I suspect this is some kind of a neuropathic type pain probably from surgery and/or radiation.  I offered her a chance to go see a pain specialist.  She wants to hold off on this for right now.  She has had no problems with bowels or bladder.  She has had no bleeding.  There has been no leg swelling.  She had a mammogram done just last week.  The mammogram did not show any evidence of suspicious microcalcifications.  PHYSICAL EXAMINATION:  This is a somewhat obese white female in no obvious distress.  Vital signs:  Temperature 97.6, pulse 58, respiratory rate 20, blood pressure 142/76.  Weight is 260.  Head and neck: Normocephalic, atraumatic skull.  There are no ocular or oral lesions. There are no palpable cervical, supraclavicular lymph nodes.  Lungs: Clear bilaterally.  Cardiac:  Regular rhythm with a normal S1 and S2. There are no murmurs, rubs, or bruits.  Abdomen:  Soft, mildly obese. She has good bowel sounds.  There is no fluid wave.  There is no palpable hepatosplenomegaly. Back:  No tenderness over the spine, ribs, or hips.  Breasts:  Left breast no masses, edema or erythema.  There is no left axillary adenopathy.  Right breast shows changes consistent with radiation and surgery.  She has radiation telangiectasias at the lumpectomy scar.  The lumpectomy scar is at the 9 o'clock position. There is no obvious mass in the right breast area.  There is some diffuse tenderness in the upper portion of the right  breast.  There is no right axillary adenopathy.  Extremities:  No lymphedema.  She has some tenderness to palpation in the right upper arm in the triceps area. She has chronic 1+ edema in her lower legs.  She has stasis dermatitis changes in her lower legs.  LABORATORY STUDIES:  Not done this visit.  IMPRESSION:  Ms. Fentress is a 66 year old white female with stage I infiltrating ductal carcinoma of the right breast.  She underwent her lumpectomy about 15 years ago.  She is doing great.  She has a less than 5% chance of recurrence from my point of view.  Again, I offered Ms. Ekblad the opportunity to see a pain specialist. She says she will think about this.  Will go ahead and plan to get her back to see [place] in another year.    ______________________________ Josph Macho, M.D. PRE/MEDQ  D:  01/02/2012  T:  01/03/2012  Job:  [id number]

## 2012-01-03 NOTE — Telephone Encounter (Signed)
Mailed 12-26-12 schedule

## 2012-01-04 ENCOUNTER — Encounter (INDEPENDENT_AMBULATORY_CARE_PROVIDER_SITE_OTHER): Payer: Self-pay | Admitting: General Surgery

## 2012-01-09 ENCOUNTER — Other Ambulatory Visit: Payer: Self-pay | Admitting: Orthopaedic Surgery

## 2012-01-09 DIAGNOSIS — R531 Weakness: Secondary | ICD-10-CM

## 2012-01-09 DIAGNOSIS — R202 Paresthesia of skin: Secondary | ICD-10-CM

## 2012-01-09 DIAGNOSIS — M545 Low back pain: Secondary | ICD-10-CM

## 2012-01-17 ENCOUNTER — Ambulatory Visit
Admission: RE | Admit: 2012-01-17 | Discharge: 2012-01-17 | Disposition: A | Discharge status: Home / Self Care | Payer: Medicare Other | Source: Ambulatory Visit | Attending: Orthopaedic Surgery | Admitting: Orthopaedic Surgery

## 2012-01-17 DIAGNOSIS — M545 Low back pain: Secondary | ICD-10-CM

## 2012-01-17 DIAGNOSIS — R531 Weakness: Secondary | ICD-10-CM

## 2012-01-17 DIAGNOSIS — R202 Paresthesia of skin: Secondary | ICD-10-CM

## 2012-03-01 ENCOUNTER — Other Ambulatory Visit: Payer: Self-pay | Admitting: Internal Medicine

## 2012-03-01 DIAGNOSIS — E041 Nontoxic single thyroid nodule: Secondary | ICD-10-CM

## 2012-03-05 ENCOUNTER — Ambulatory Visit
Admission: RE | Admit: 2012-03-05 | Discharge: 2012-03-05 | Disposition: A | Discharge status: Home / Self Care | Payer: Medicare Other | Source: Ambulatory Visit | Attending: Internal Medicine | Admitting: Internal Medicine

## 2012-03-05 DIAGNOSIS — E041 Nontoxic single thyroid nodule: Secondary | ICD-10-CM

## 2012-03-06 ENCOUNTER — Other Ambulatory Visit: Payer: Self-pay | Admitting: Internal Medicine

## 2012-03-06 DIAGNOSIS — E041 Nontoxic single thyroid nodule: Secondary | ICD-10-CM

## 2012-03-07 ENCOUNTER — Other Ambulatory Visit (HOSPITAL_COMMUNITY)
Admission: RE | Admit: 2012-03-07 | Discharge: 2012-03-07 | Disposition: A | Discharge status: Home / Self Care | Payer: Medicare Other | Source: Ambulatory Visit | Attending: Interventional Radiology | Admitting: Interventional Radiology

## 2012-03-07 ENCOUNTER — Ambulatory Visit
Admission: RE | Admit: 2012-03-07 | Discharge: 2012-03-07 | Disposition: A | Discharge status: Home / Self Care | Payer: Medicare Other | Source: Ambulatory Visit | Attending: Internal Medicine | Admitting: Internal Medicine

## 2012-03-07 DIAGNOSIS — E049 Nontoxic goiter, unspecified: Secondary | ICD-10-CM | POA: Insufficient documentation

## 2012-03-07 DIAGNOSIS — E041 Nontoxic single thyroid nodule: Secondary | ICD-10-CM

## 2012-03-15 ENCOUNTER — Encounter (INDEPENDENT_AMBULATORY_CARE_PROVIDER_SITE_OTHER): Payer: Self-pay | Admitting: General Surgery

## 2012-03-15 ENCOUNTER — Ambulatory Visit (INDEPENDENT_AMBULATORY_CARE_PROVIDER_SITE_OTHER): Payer: Medicare Other | Admitting: General Surgery

## 2012-03-15 VITALS — BP 128/70 | HR 68 | Temp 97.3°F | Resp 16 | Ht 66.0 in | Wt 258.5 lb

## 2012-03-15 DIAGNOSIS — C50519 Malignant neoplasm of lower-outer quadrant of unspecified female breast: Secondary | ICD-10-CM | POA: Insufficient documentation

## 2012-03-15 NOTE — Patient Instructions (Signed)
Your physical exam today is normal. There is no evidence of breast cancer.  Be sure to get your annual mammograms in June of 2014.  Return to see Dr. Derrell Lolling in one year.

## 2012-03-15 NOTE — Progress Notes (Signed)
Patient ID: [name], female   DOB: [birth year], 66 y.o.   MRN: [id number]  Chief Complaint  Patient presents with  . Breast Cancer Long Term Follow Up    HPI [name] is a 66 y.o. female.  She returns for annual visit, long-term followup of her right breast cancer.  On 08/26/1996 she underwent right partial mastectomy and axillary lymph node dissection by Dr. Francina Ames. Pathologic stage T1c., N0, receptor positive. She underwent adjuvant chemotherapy followed by adjuvant radiation therapy. There has been no recurrence.  Last mammograms 12/29/2011 category 1, no focal abnormality.  She has no complaints about her breast. They exhibit stable.  She continues to be followed by Dr. Waynard Edwards and Dr. Peter Swaziland. HPI  Past Medical History  Diagnosis Date  . Cancer   . Dyspnea   . Asthma   . Coronary artery disease   . Diabetes mellitus   . Hypertension   . Hyperlipidemia   . Obesity   . Breast cancer   . OA (osteoarthritis) of knee   . Depression   . Renal calculi   . GERD (gastroesophageal reflux disease)   . Diabetes mellitus type II     Past Surgical History  Procedure Date  . Triple bypass 04/27/05  . Cardiac catheterization 04/23/2004    EF 60%  . Coronary artery bypass graft 2005    LIMA GRAFT TO LAD, SAPHENOUS VEIN GRAFT TO THE FIRST DIAGONAL, AND LEFT RADIAL ARTERY GRAFT TO THE OM  . Breast lumpectomy     RIGHT BREAST  . Tubal ligation   . Wrist surgery   . US echocardiography 01/06/2009    EF 55-60%  . Cardiovascular stress test 07/12/2010    EF 69%  . Total knee arthroplasty     left    Family History  Problem Relation Age of Onset  . Heart disease Mother   . Cancer Father   . Cancer Brother   . Heart disease Brother     Social History History  Substance Use Topics  . Smoking status: Never Smoker   . Smokeless tobacco: Not on file  . Alcohol Use: No    No Known Allergies  Current Outpatient Prescriptions  Medication Sig Dispense  Refill  . amLODipine (NORVASC) 5 MG tablet Daily.      Marland Kitchen ALFALFA PO Take by mouth 2 (two) times daily.        Marland Kitchen amLODipine-benazepril (LOTREL) 5-20 MG per capsule Take 1 capsule by mouth daily.        Marland Kitchen aspirin 81 MG tablet Take 81 mg by mouth daily.        Marland Kitchen b complex vitamins tablet Take 1 tablet by mouth 2 (two) times daily.        . benazepril (LOTENSIN) 20 MG tablet       . buPROPion (WELLBUTRIN SR) 150 MG 12 hr tablet Take 150 mg by mouth daily.        . cholecalciferol (VITAMIN D) 1000 UNITS tablet Take 1,000 Units by mouth daily.        . citalopram (CELEXA) 20 MG tablet Take 20 mg by mouth daily.        . Flaxseed, Linseed, (FLAX SEED OIL PO) Take 1,000 mg by mouth 2 (two) times daily.        . furosemide (LASIX) 20 MG tablet Take 20 mg by mouth as needed.        . hydrochlorothiazide 25 MG tablet Take 25 mg by  mouth daily.        . meloxicam (MOBIC) 15 MG tablet Take 15 mg by mouth daily.        . mometasone (ASMANEX) 220 MCG/INH inhaler Inhale 2 puffs into the lungs daily.        . Multiple Vitamin (MULTIVITAMIN) tablet Take 1 tablet by mouth daily.        . nebivolol (BYSTOLIC) 10 MG tablet Take 10 mg by mouth daily.        Marland Kitchen POTASSIUM PO Take by mouth daily.        . pregabalin (LYRICA) 50 MG capsule Take 50 mg by mouth 2 (two) times daily.       Marland Kitchen VITAMIN E PO Take by mouth daily.          Review of Systems Review of Systems  Constitutional: Negative for fever, chills and unexpected weight change.  HENT: Negative for hearing loss, congestion, sore throat, trouble swallowing and voice change.   Eyes: Negative for visual disturbance.  Respiratory: Negative for cough and wheezing.   Cardiovascular: Negative for chest pain, palpitations and leg swelling.  Gastrointestinal: Negative for nausea, vomiting, abdominal pain, diarrhea, constipation, blood in stool, abdominal distention and anal bleeding.  Genitourinary: Negative for hematuria, vaginal bleeding and difficulty  urinating.  Musculoskeletal: Positive for joint swelling and arthralgias.  Skin: Negative for rash and wound.  Neurological: Negative for seizures, syncope and headaches.  Hematological: Negative for adenopathy. Does not bruise/bleed easily.  Psychiatric/Behavioral: Negative for confusion.    Blood pressure 128/70, pulse 68, temperature 97.3 F (36.3 C), temperature source Temporal, resp. rate 16, height 5\' 6"  (1.676 m), weight 258 lb 8 oz (117.255 kg).  Physical Exam Physical Exam  Constitutional: She is oriented to person, place, and time. She appears well-developed and well-nourished. No distress.       BMI 41.72  HENT:  Head: Normocephalic and atraumatic.  Nose: Nose normal.  Mouth/Throat: No oropharyngeal exudate.  Neck: Neck supple. No JVD present. No tracheal deviation present. No thyromegaly present.  Cardiovascular: Normal rate, regular rhythm, normal heart sounds and intact distal pulses.   No murmur heard. Pulmonary/Chest: Effort normal and breath sounds normal. No respiratory distress. She has no wheezes. She has no rales. She exhibits no tenderness.       Well-healed sternotomy scar. Well-healed scar right breast, lower outer quadrant. Well-healed scar right axilla. Well-healed Port-A-Cath scar a left parasternal area. There is no mass, skin changes, or axillary adenopathy on either side. There are some chronic petechiae localized to the right breast incision which are chronic.  Lymphadenopathy:    She has no cervical adenopathy.  Neurological: She is alert and oriented to person, place, and time. She exhibits normal muscle tone. Coordination normal.  Skin: Skin is warm. No rash noted. She is not diaphoretic. No erythema. No pallor.  Psychiatric: She has a normal mood and affect. Her behavior is normal. Judgment and thought content normal.    Data Reviewed Mammograms. Prior office records. Notes from Dr. Peter Swaziland.  Assessment    Invasive ductal carcinoma right  breast, pathologic stage T1c,  N0, receptor positive.  No evidence of recurrence 15 years following right partial mastectomy, axillary lymph node dissection, adjuvant chemotherapy and radiation therapy.  Coronary artery disease, status post coronary artery bypass grafting  Morbid obesity  Hypertension  Hyperlipidemia  Degenerative joint disease status post total knee replacement.    Plan    She was reassured regarding breast cancer evaluation.  She was  advised to get annual breast exam and annual mammography. She requested that I continue to do this annually.  Mammograms will be repeated in June of 2014.  Return to see me in one year.       Angelia Mould. Derrell Lolling, M.D., [facility] General and Minimally invasive Surgery Breast and Colorectal Surgery Office:   [phone] Pager:   [phone]  03/15/2012, 11:08 AM

## 2012-03-21 ENCOUNTER — Encounter: Payer: Self-pay | Admitting: Cardiology

## 2012-03-21 ENCOUNTER — Ambulatory Visit (INDEPENDENT_AMBULATORY_CARE_PROVIDER_SITE_OTHER): Payer: Medicare Other | Admitting: Cardiology

## 2012-03-21 VITALS — BP 122/82 | HR 67 | Ht 66.0 in | Wt 262.0 lb

## 2012-03-21 DIAGNOSIS — E669 Obesity, unspecified: Secondary | ICD-10-CM

## 2012-03-21 DIAGNOSIS — I10 Essential (primary) hypertension: Secondary | ICD-10-CM

## 2012-03-21 DIAGNOSIS — E785 Hyperlipidemia, unspecified: Secondary | ICD-10-CM

## 2012-03-21 DIAGNOSIS — I2510 Atherosclerotic heart disease of native coronary artery without angina pectoris: Secondary | ICD-10-CM

## 2012-03-21 NOTE — Progress Notes (Signed)
[name] Date of Birth: 07-21-1946   History of Present Illness: [name] is seen for followup visit today. She reports that she is not exercising as much. She does have occasional shortness of breath when she lies down at night. She complains of symptoms of fatigue. She states she does snore some.She apparently had some vascular screening including Dopplers for aortic aneurysm and carotid disease which were unremarkable. She was found to have a thyroid nodule which was biopsied and was negative. She is still limited from her bilateral knee replacements. She denies any chest pain.  Current Outpatient Prescriptions on File Prior to Visit  Medication Sig Dispense Refill  . ALFALFA PO Take by mouth 2 (two) times daily.        Marland Kitchen amLODipine (NORVASC) 5 MG tablet Daily.      Marland Kitchen amLODipine-benazepril (LOTREL) 5-20 MG per capsule Take 1 capsule by mouth daily.        Marland Kitchen aspirin 81 MG tablet Take 81 mg by mouth daily.        Marland Kitchen b complex vitamins tablet Take 1 tablet by mouth 2 (two) times daily.        . benazepril (LOTENSIN) 20 MG tablet       . buPROPion (WELLBUTRIN SR) 150 MG 12 hr tablet Take 150 mg by mouth daily.        . cholecalciferol (VITAMIN D) 1000 UNITS tablet Take 1,000 Units by mouth daily.        . citalopram (CELEXA) 20 MG tablet Take 20 mg by mouth daily.        . Flaxseed, Linseed, (FLAX SEED OIL PO) Take 1,000 mg by mouth 2 (two) times daily.        . furosemide (LASIX) 20 MG tablet Take 20 mg by mouth as needed.        . hydrochlorothiazide 25 MG tablet Take 25 mg by mouth daily.        . meloxicam (MOBIC) 15 MG tablet Take 15 mg by mouth daily.        . mometasone (ASMANEX) 220 MCG/INH inhaler Inhale 2 puffs into the lungs daily.        . Multiple Vitamin (MULTIVITAMIN) tablet Take 1 tablet by mouth daily.        . nebivolol (BYSTOLIC) 10 MG tablet Take 10 mg by mouth daily.        Marland Kitchen POTASSIUM PO Take by mouth daily.        . pregabalin (LYRICA) 50 MG capsule Take 50  mg by mouth 2 (two) times daily.       Marland Kitchen VITAMIN E PO Take by mouth daily.          No Known Allergies  Past Medical History  Diagnosis Date  . Cancer   . Dyspnea   . Asthma   . Coronary artery disease   . Diabetes mellitus   . Hypertension   . Hyperlipidemia   . Obesity   . Breast cancer   . OA (osteoarthritis) of knee   . Depression   . Renal calculi   . GERD (gastroesophageal reflux disease)   . Diabetes mellitus type II     Past Surgical History  Procedure Date  . Triple bypass 04/27/05  . Cardiac catheterization 04/23/2004    EF 60%  . Coronary artery bypass graft 2005    LIMA GRAFT TO LAD, SAPHENOUS VEIN GRAFT TO THE FIRST DIAGONAL, AND LEFT RADIAL ARTERY GRAFT TO THE OM  .  Breast lumpectomy     RIGHT BREAST  . Tubal ligation   . Wrist surgery   . US echocardiography 01/06/2009    EF 55-60%  . Cardiovascular stress test 07/12/2010    EF 69%  . Total knee arthroplasty     left    History  Smoking status  . Never Smoker   Smokeless tobacco  . Not on file    History  Alcohol Use No    Family History  Problem Relation Age of Onset  . Heart disease Mother   . Cancer Father   . Cancer Brother   . Heart disease Brother     Review of Systems: As noted history of present illness.  All other systems were reviewed and are negative.  Physical Exam: BP 122/82  Pulse 67  Ht 5\' 6"  (1.676 m)  Wt 262 lb (118.842 kg)  BMI 42.29 kg/m2 She is a pleasant obese white female in no acute distress. Her HEENT exam is unremarkable. She is normocephalic, atraumatic. Pupils are equal round and reactive to light and accommodation. Extraocular movements are full. Oropharynx is clear. Neck is supple without JVD, adenopathy, thyromegaly, or bruits. Lungs are clear. Cardiac exam reveals a regular rate and rhythm without gallop, murmur, or click. Abdomen is obese, soft, nontender. She has trace lower extremity edema. She has arthritic changes in her knees. She is alert and  oriented x3. Cranial nerves II through XII are intact. LABORATORY DATA: ECG today shows normal sinus rhythm with a nonspecific T-wave abnormality which is unchanged from one year ago.  Assessment / Plan: 1. Coronary disease. She remains asymptomatic. She is status post CABG in 2005. Nuclear stress test in December of 2011 was normal.  2. Hypertension, controlled.  3. Hyperlipidemia.  4. Diabetes mellitus type 2.  4. Morbid obesity. Patient does have some symptoms that are concerning for possible sleep apnea. She is at increased risk. I recommended consideration for a sleep study. Patient states she does not want this at this time. I will defer to Dr. Waynard Edwards.

## 2012-03-21 NOTE — Patient Instructions (Signed)
Continue your current therapy  Consider a sleep study to evaluate for sleep apnea.  I will see you again in 6 months.

## 2012-11-15 ENCOUNTER — Encounter: Payer: Self-pay | Admitting: Cardiology

## 2012-11-16 ENCOUNTER — Encounter: Payer: Self-pay | Admitting: Cardiology

## 2012-12-26 ENCOUNTER — Other Ambulatory Visit (HOSPITAL_BASED_OUTPATIENT_CLINIC_OR_DEPARTMENT_OTHER): Payer: Medicare Other | Admitting: Lab

## 2012-12-26 ENCOUNTER — Ambulatory Visit (HOSPITAL_BASED_OUTPATIENT_CLINIC_OR_DEPARTMENT_OTHER): Payer: Medicare Other | Admitting: Hematology & Oncology

## 2012-12-26 VITALS — BP 157/66 | HR 56 | Temp 98.1°F | Resp 16 | Ht 65.0 in | Wt 263.0 lb

## 2012-12-26 DIAGNOSIS — Z853 Personal history of malignant neoplasm of breast: Secondary | ICD-10-CM

## 2012-12-26 DIAGNOSIS — G8929 Other chronic pain: Secondary | ICD-10-CM

## 2012-12-26 DIAGNOSIS — M179 Osteoarthritis of knee, unspecified: Secondary | ICD-10-CM

## 2012-12-26 DIAGNOSIS — C50911 Malignant neoplasm of unspecified site of right female breast: Secondary | ICD-10-CM

## 2012-12-26 DIAGNOSIS — M1710 Unilateral primary osteoarthritis, unspecified knee: Secondary | ICD-10-CM

## 2012-12-26 DIAGNOSIS — M810 Age-related osteoporosis without current pathological fracture: Secondary | ICD-10-CM

## 2012-12-26 DIAGNOSIS — C50919 Malignant neoplasm of unspecified site of unspecified female breast: Secondary | ICD-10-CM

## 2012-12-26 LAB — CBC WITH DIFFERENTIAL (CANCER CENTER ONLY)
BASO%: 0.3 % (ref 0.0–2.0)
EOS%: 0.8 % (ref 0.0–7.0)
Eosinophils Absolute: 0.1 10*3/uL (ref 0.0–0.5)
LYMPH%: 40.4 % (ref 14.0–48.0)
MCH: 30.9 pg (ref 26.0–34.0)
MCHC: 33.3 g/dL (ref 32.0–36.0)
MCV: 93 fL (ref 81–101)
MONO%: 10 % (ref 0.0–13.0)
NEUT#: 3 10*3/uL (ref 1.5–6.5)
Platelets: 158 10*3/uL (ref 145–400)
RBC: 4.37 10*6/uL (ref 3.70–5.32)
RDW: 14 % (ref 11.1–15.7)

## 2012-12-26 NOTE — Progress Notes (Signed)
This office note has been dictated.

## 2012-12-27 LAB — COMPREHENSIVE METABOLIC PANEL
ALT: 20 U/L (ref 0–35)
AST: 19 U/L (ref 0–37)
Albumin: 4.7 g/dL (ref 3.5–5.2)
Alkaline Phosphatase: 37 U/L — ABNORMAL LOW (ref 39–117)
Potassium: 4.4 mEq/L (ref 3.5–5.3)
Sodium: 142 mEq/L (ref 135–145)
Total Bilirubin: 0.5 mg/dL (ref 0.3–1.2)
Total Protein: 6.7 g/dL (ref 6.0–8.3)

## 2012-12-27 NOTE — Progress Notes (Signed)
CC:   Mark A. Perini, M.D.  DIAGNOSIS:  Stage I (T1 N0 M0) ductal carcinoma of the right breast.  CURRENT THERAPY:  Observation.  INTERIM HISTORY:  Ms. Ucci comes in for followup.  We see her once a year.  She is doing okay.  Unfortunately, her older sister is not doing too well.  Her older sister is having some brain issues from trauma, and Ms. Maddalena is taking care of her.  Ms. Ports does have diabetes.  She also has other health issues.  She is trying to watch her weight.  She does have, I think, fibromyalgia. She does have arthritic issues.  She has rheumatoid arthritis.  She is on Plaquenil.  She has had no bleeding or bruising.  There has been no change in bowel or bladder habits.  She is due for a mammogram in the next month or so.  Her last mammogram was done back in June 2013.  She has had no cough.  She had no headache.  PHYSICAL EXAMINATION:  General:  This is an obese white female in no obvious distress.  Vital signs:  Temperature of 98.1, pulse 56, respiratory rate 16, blood pressure 157/66.  Weight is 263.  Head and neck:  Normocephalic, atraumatic skull.  There are no ocular or oral lesions.  There are no palpable cervical or supraclavicular lymph nodes. Lungs:  Clear bilaterally.  Cardiac:  Regular rate and rhythm, with a normal S1, S2.  There are no murmurs, rubs or bruits.  Abdomen:  Soft, moderately obese.  She has good bowel sounds.  There is no fluid wave. There is no palpable hepatosplenomegaly.  Breasts:  Show left breast with no masses, edema or erythema.  There is no left axillary adenopathy.  Right breast is contracted from radiation surgery.  She does have the lumpectomy scar that is quite firm and surrounded by radiation telangiectasias adjacent to the areola at about the 7 o'clock position.  Again, there is firmness about the right breast.  No distinct masses noted in the right breast.  There is no right axillary adenopathy.  Back:  Shows no  tenderness over the spine, ribs, or hips. Extremities:  Show some dermatitis from her diabetes.  This is in the lower legs.  No edema is noted in the legs.  The joints do not appear swollen.  Skin:  Shows the diabetic rash in the lower legs.  She may have some stasis dermatitis changes in the lower legs.  LABORATORY STUDIES:  White cell count is 6.1, hemoglobin 13.5, hematocrit 40.6, platelet count 158,000.  IMPRESSION:  Ms. Joo is a 67 year old white female with a history of stage I ductal carcinoma of the right breast.  She now is out from lumpectomy.  She had this about 16 years ago.  She still likes to come back to see [place].  We will plan to get her back yearly.    ______________________________ Josph Macho, M.D. PRE/MEDQ  D:  12/26/2012  T:  12/27/2012  Job:  [id number]

## 2012-12-28 ENCOUNTER — Other Ambulatory Visit: Payer: Self-pay

## 2012-12-28 ENCOUNTER — Telehealth: Payer: Self-pay | Admitting: *Deleted

## 2012-12-28 DIAGNOSIS — Z853 Personal history of malignant neoplasm of breast: Secondary | ICD-10-CM

## 2012-12-28 DIAGNOSIS — Z9889 Other specified postprocedural states: Secondary | ICD-10-CM

## 2012-12-28 DIAGNOSIS — Z1231 Encounter for screening mammogram for malignant neoplasm of breast: Secondary | ICD-10-CM

## 2012-12-28 NOTE — Telephone Encounter (Signed)
Message copied by Anselm Jungling on Fri Dec 28, 2012 12:30 PM ------      Message from: Arlan Organ R      Created: Thu Dec 27, 2012  4:52 PM       Call - labs and vit D are ok!!  Cindee Lame ------

## 2012-12-28 NOTE — Telephone Encounter (Signed)
Called patient to let her know that her labs and vitamin d are ok per dr. ennever 

## 2013-01-16 ENCOUNTER — Encounter (INDEPENDENT_AMBULATORY_CARE_PROVIDER_SITE_OTHER): Payer: Self-pay | Admitting: General Surgery

## 2013-01-28 ENCOUNTER — Ambulatory Visit
Admission: RE | Admit: 2013-01-28 | Discharge: 2013-01-28 | Disposition: A | Discharge status: Home / Self Care | Payer: Medicare Other | Source: Ambulatory Visit

## 2013-01-28 DIAGNOSIS — Z1231 Encounter for screening mammogram for malignant neoplasm of breast: Secondary | ICD-10-CM

## 2013-01-28 DIAGNOSIS — Z9889 Other specified postprocedural states: Secondary | ICD-10-CM

## 2013-01-28 DIAGNOSIS — Z853 Personal history of malignant neoplasm of breast: Secondary | ICD-10-CM

## 2013-07-22 ENCOUNTER — Ambulatory Visit: Payer: Medicare Other

## 2013-07-22 ENCOUNTER — Ambulatory Visit (INDEPENDENT_AMBULATORY_CARE_PROVIDER_SITE_OTHER): Payer: Medicare Other | Admitting: Emergency Medicine

## 2013-07-22 VITALS — BP 130/73 | HR 67 | Temp 100.0°F | Resp 18 | Ht 64.0 in | Wt 259.0 lb

## 2013-07-22 DIAGNOSIS — R0602 Shortness of breath: Secondary | ICD-10-CM

## 2013-07-22 DIAGNOSIS — J209 Acute bronchitis, unspecified: Secondary | ICD-10-CM

## 2013-07-22 DIAGNOSIS — J45901 Unspecified asthma with (acute) exacerbation: Secondary | ICD-10-CM

## 2013-07-22 MED ORDER — AZITHROMYCIN 250 MG PO TABS: ORAL_TABLET | ORAL | Status: DC

## 2013-07-22 MED ORDER — IPRATROPIUM BROMIDE 0.02 % IN SOLN
0.5000 mg | Freq: Once | RESPIRATORY_TRACT | Status: AC
  Administered 2013-07-22: 0.5 mg via RESPIRATORY_TRACT

## 2013-07-22 MED ORDER — ALBUTEROL SULFATE HFA 108 (90 BASE) MCG/ACT IN AERS: 2.0000 | INHALATION_SPRAY | RESPIRATORY_TRACT | Status: DC | PRN

## 2013-07-22 MED ORDER — ALBUTEROL SULFATE (2.5 MG/3ML) 0.083% IN NEBU: 2.5000 mg | INHALATION_SOLUTION | Freq: Once | RESPIRATORY_TRACT | Status: DC

## 2013-07-22 MED ORDER — PROMETHAZINE-CODEINE 6.25-10 MG/5ML PO SYRP: 5.0000 mL | ORAL_SOLUTION | Freq: Four times a day (QID) | ORAL | Status: DC | PRN

## 2013-07-22 MED ORDER — IPRATROPIUM BROMIDE 0.02 % IN SOLN: 5.0000 mg | Freq: Once | RESPIRATORY_TRACT | Status: DC

## 2013-07-22 MED ORDER — ALBUTEROL SULFATE (2.5 MG/3ML) 0.083% IN NEBU
5.0000 mg | INHALATION_SOLUTION | Freq: Once | RESPIRATORY_TRACT | Status: AC
  Administered 2013-07-22: 5 mg via RESPIRATORY_TRACT

## 2013-07-22 NOTE — Addendum Note (Signed)
Addended by: Sheldon Silvan on: 07/22/2013 08:05 PM   Modules accepted: Level of Service

## 2013-07-22 NOTE — Patient Instructions (Signed)

## 2013-07-22 NOTE — Progress Notes (Signed)
[facility] [address], [place] [address] [phone]  Date:  07/22/2013   Name:  [name]   DOB:  November 06, 1945   MRN:  [id number]  PCP:  Ezequiel Kayser, MD    Chief Complaint: Fever and Shortness of Breath   History of Present Illness:  [name] is a 67 y.o. very pleasant female patient who presents with the following:  History of asthma with multiple MDI's.  Ill since Friday with increased wheezing and decreased exercise tolerance.  Increased use of MDI's but can't remember names.  Has a cough with mucopurulent sputum.  Fever to 102.  No nausea or vomiting. No coryza.  No orthopnea or pnd.  No peripheral edema.  No rash.  No improvement with over the counter medications or other home remedies. Denies other complaint or health concern today.   Patient Active Problem List   Diagnosis Date Noted  . Morbid obesity 03/21/2012  . Cancer of lower-outer quadrant of female breast 03/15/2012  . Diabetes mellitus type II 02/22/2011  . Coronary artery disease   . Hypertension   . Hyperlipidemia   . OA (osteoarthritis) of knee     Past Medical History  Diagnosis Date  . Cancer   . Dyspnea   . Asthma   . Coronary artery disease   . Diabetes mellitus   . Hypertension   . Hyperlipidemia   . Obesity   . Breast cancer   . OA (osteoarthritis) of knee   . Depression   . Renal calculi   . GERD (gastroesophageal reflux disease)   . Diabetes mellitus type II   . Cataract     Past Surgical History  Procedure Laterality Date  . Triple bypass  04/27/05  . Cardiac catheterization  04/23/2004    EF 60%  . Coronary artery bypass graft  2005    LIMA GRAFT TO LAD, SAPHENOUS VEIN GRAFT TO THE FIRST DIAGONAL, AND LEFT RADIAL ARTERY GRAFT TO THE OM  . Breast lumpectomy      RIGHT BREAST  . Tubal ligation    . Wrist surgery    . US echocardiography  01/06/2009    EF 55-60%  . Cardiovascular stress test  07/12/2010    EF 69%  . Total knee arthroplasty       left  . Joint replacement      History  Substance Use Topics  . Smoking status: Never Smoker   . Smokeless tobacco: Not on file  . Alcohol Use: No    Family History  Problem Relation Age of Onset  . Heart disease Mother   . Cancer Father   . Cancer Brother   . Heart disease Brother     No Known Allergies  Medication list has been reviewed and updated.  Current Outpatient Prescriptions on File Prior to Visit  Medication Sig Dispense Refill  . ALFALFA PO Take by mouth 2 (two) times daily.        Marland Kitchen amLODipine (NORVASC) 5 MG tablet Take 5 mg by mouth Daily.       Marland Kitchen amLODipine-benazepril (LOTREL) 5-20 MG per capsule Take 1 capsule by mouth daily.        Marland Kitchen aspirin 81 MG tablet Take 81 mg by mouth daily.        Marland Kitchen b complex vitamins tablet Take 1 tablet by mouth 2 (two) times daily.        . benazepril (LOTENSIN) 20 MG tablet Take 20 mg  by mouth daily.       Marland Kitchen buPROPion (WELLBUTRIN SR) 150 MG 12 hr tablet Take 150 mg by mouth daily.        . cholecalciferol (VITAMIN D) 1000 UNITS tablet Take 1,000 Units by mouth daily.        . citalopram (CELEXA) 20 MG tablet Take 20 mg by mouth daily.        . Flaxseed, Linseed, (FLAX SEED OIL PO) Take 1,000 mg by mouth 2 (two) times daily.        . furosemide (LASIX) 20 MG tablet Take 20 mg by mouth as needed.        . hydrochlorothiazide 25 MG tablet Take 25 mg by mouth daily.        . hydroxychloroquine (PLAQUENIL) 200 MG tablet Take by mouth daily.       . meloxicam (MOBIC) 15 MG tablet Take 15 mg by mouth daily.        . mometasone (ASMANEX) 220 MCG/INH inhaler Inhale 2 puffs into the lungs daily.        . Multiple Vitamin (MULTIVITAMIN) tablet Take 1 tablet by mouth daily.        . nebivolol (BYSTOLIC) 10 MG tablet Take 10 mg by mouth daily.        Marland Kitchen POTASSIUM PO Take by mouth 2 (two) times daily.       . pregabalin (LYRICA) 50 MG capsule Take 50 mg by mouth 3 (three) times daily.       Marland Kitchen VITAMIN E PO Take by mouth daily.         No  current facility-administered medications on file prior to visit.    Review of Systems:  As per HPI, otherwise negative.    Physical Examination: Filed Vitals:   07/22/13 1834  BP: 130/73  Pulse: 67  Temp: 100 F (37.8 C)  Resp: 18   Filed Vitals:   07/22/13 1834  Height: 5\' 4"  (1.626 m)  Weight: 259 lb (117.482 kg)   Body mass index is 44.44 kg/(m^2). Ideal Body Weight: Weight in (lb) to have BMI = 25: 145.3  GEN: WDWN, NAD, Non-toxic, A & O x 3 HEENT: Atraumatic, Normocephalic. Neck supple. No masses, No LAD. Ears and Nose: No external deformity. CV: RRR, No M/G/R. No JVD. No thrill. No extra heart sounds. PULM: CTA B, no wheezes, crackles, rhonchi. No retractions. No resp. distress. No accessory muscle use. ABD: S, NT, ND, +BS. No rebound. No HSM. EXTR: No c/c/e NEURO Normal gait.  PSYCH: Normally interactive. Conversant. Not depressed or anxious appearing.  Calm demeanor.    Assessment and Plan: Bronchitis zpak Phen c cod  Signed,  Phillips Odor, MD   [facility] reading (PRIMARY) by  Dr. Dareen Piano.  NAD chest .

## 2013-09-04 ENCOUNTER — Encounter: Payer: Self-pay | Admitting: Cardiology

## 2013-09-04 ENCOUNTER — Ambulatory Visit (INDEPENDENT_AMBULATORY_CARE_PROVIDER_SITE_OTHER): Payer: Medicare Other | Admitting: Cardiology

## 2013-09-04 VITALS — BP 140/79 | HR 66 | Ht 65.0 in | Wt 265.4 lb

## 2013-09-04 DIAGNOSIS — R0600 Dyspnea, unspecified: Secondary | ICD-10-CM

## 2013-09-04 DIAGNOSIS — I10 Essential (primary) hypertension: Secondary | ICD-10-CM

## 2013-09-04 DIAGNOSIS — R0989 Other specified symptoms and signs involving the circulatory and respiratory systems: Secondary | ICD-10-CM

## 2013-09-04 DIAGNOSIS — E785 Hyperlipidemia, unspecified: Secondary | ICD-10-CM

## 2013-09-04 DIAGNOSIS — I2510 Atherosclerotic heart disease of native coronary artery without angina pectoris: Secondary | ICD-10-CM

## 2013-09-04 DIAGNOSIS — R0609 Other forms of dyspnea: Secondary | ICD-10-CM

## 2013-09-04 DIAGNOSIS — R0789 Other chest pain: Secondary | ICD-10-CM

## 2013-09-04 NOTE — Progress Notes (Signed)
[name] Date of Birth: 1945/11/23   History of Present Illness: [name] is seen for followup visit today. She is s/p CABG in 2005. Myoview in 12/11 was normal. On follow up today she complains of increased chest pain and dyspnea. She gets SOB with any activity. Was seen at Urgent care one month ago with fever and wheezing. Had a breathing treatment. Breathing is better on Asthmanex. Her chest pain is difficult for her to describe. States it is midsternal. Sharp and with pressure. States it is different than pain she has had before. No increase in edema. I really unsure of her medications and did not bring a list today.  Current Outpatient Prescriptions on File Prior to Visit  Medication Sig Dispense Refill  . albuterol (PROVENTIL HFA;VENTOLIN HFA) 108 (90 BASE) MCG/ACT inhaler Inhale 2 puffs into the lungs every 4 (four) hours as needed for wheezing or shortness of breath (cough, shortness of breath or wheezing.).  1 Inhaler  12  . ALFALFA PO Take by mouth 2 (two) times daily.        Marland Kitchen amLODipine (NORVASC) 5 MG tablet Take 5 mg by mouth Daily.       Marland Kitchen amLODipine-benazepril (LOTREL) 5-20 MG per capsule Take 1 capsule by mouth daily.       Marland Kitchen aspirin 81 MG tablet Take 81 mg by mouth daily.        Marland Kitchen b complex vitamins tablet Take 1 tablet by mouth 2 (two) times daily.        . benazepril (LOTENSIN) 20 MG tablet Take 20 mg by mouth daily.       Marland Kitchen buPROPion (WELLBUTRIN SR) 150 MG 12 hr tablet Take 150 mg by mouth daily.        . citalopram (CELEXA) 20 MG tablet Take 20 mg by mouth daily.        . Flaxseed, Linseed, (FLAX SEED OIL PO) Take 1,000 mg by mouth 2 (two) times daily.        . furosemide (LASIX) 20 MG tablet Take 20 mg by mouth as needed.        . hydrochlorothiazide 25 MG tablet Take 25 mg by mouth daily.        . hydroxychloroquine (PLAQUENIL) 200 MG tablet Take 200 mg by mouth daily.       . meloxicam (MOBIC) 15 MG tablet Take 15 mg by mouth daily.        . methotrexate  (RHEUMATREX) 5 MG tablet Take 8 tablets every Friday.    Caution: Chemotherapy. Protect from light.      . mometasone (ASMANEX) 220 MCG/INH inhaler Inhale 2 puffs into the lungs daily.        . Multiple Vitamin (MULTIVITAMIN) tablet Take 1 tablet by mouth daily.        . nebivolol (BYSTOLIC) 10 MG tablet Take 10 mg by mouth daily.        Marland Kitchen POTASSIUM PO Take by mouth 2 (two) times daily.       Marland Kitchen VITAMIN E PO Take 200 mg by mouth daily.        No current facility-administered medications on file prior to visit.    No Known Allergies  Past Medical History  Diagnosis Date  . Cancer   . Dyspnea   . Asthma   . Coronary artery disease   . Diabetes mellitus   . Hypertension   . Hyperlipidemia   . Obesity   . Breast cancer   .  OA (osteoarthritis) of knee   . Depression   . Renal calculi   . GERD (gastroesophageal reflux disease)   . Diabetes mellitus type II   . Cataract     Past Surgical History  Procedure Laterality Date  . Triple bypass  04/27/05  . Cardiac catheterization  04/23/2004    EF 60%  . Coronary artery bypass graft  2005    LIMA GRAFT TO LAD, SAPHENOUS VEIN GRAFT TO THE FIRST DIAGONAL, AND LEFT RADIAL ARTERY GRAFT TO THE OM  . Breast lumpectomy      RIGHT BREAST  . Tubal ligation    . Wrist surgery    . US echocardiography  01/06/2009    EF 55-60%  . Cardiovascular stress test  07/12/2010    EF 69%  . Total knee arthroplasty      left  . Joint replacement      History  Smoking status  . Never Smoker   Smokeless tobacco  . Not on file    History  Alcohol Use No    Family History  Problem Relation Age of Onset  . Heart disease Mother   . Cancer Father   . Cancer Brother   . Heart disease Brother     Review of Systems: As noted history of present illness.  All other systems were reviewed and are negative.  Physical Exam: BP 140/79  Pulse 66  Ht 5\' 5"  (1.651 m)  Wt 265 lb 6.4 oz (120.385 kg)  BMI 44.16 kg/m2 She is a pleasant obese white  female in no acute distress. Her HEENT exam is unremarkable. She is normocephalic, atraumatic. Pupils are equal round and reactive to light and accommodation. Extraocular movements are full. Oropharynx is clear. Neck is supple without JVD, adenopathy, thyromegaly, or bruits. Lungs are clear. Cardiac exam reveals a regular rate and rhythm without gallop, murmur, or click. Abdomen is obese, soft, nontender. She has trace lower extremity edema. She has arthritic changes in her knees. She is alert and oriented x3. Cranial nerves II through XII are intact.  LABORATORY DATA: ECG today shows normal sinus rhythm with a nonspecific ST  abnormality which is unchanged from one year ago.  Assessment / Plan: 1. Coronary disease.  She is status post CABG in 2005. Nuclear stress test in December of 2011 was normal. Now with symptoms of chest pain- atypical in nature. I have recommended a 2 day Lexiscan myoview.  2. Hypertension, controlled.  3. Hyperlipidemia.  4. Diabetes mellitus type 2.  5. Morbid obesity.   6. Dyspnea on exertion. Most likely related to her morbid obesity and asthma. Will check Echo to assess LV function and pulmonary pressures. If she has pulmonary HTN I would strongly advise pulmonary evaluation/sleep study.

## 2013-09-04 NOTE — Patient Instructions (Signed)
We will schedule you for a nuclear stress test and an Echocardiogram   

## 2013-09-09 ENCOUNTER — Ambulatory Visit (HOSPITAL_COMMUNITY): Payer: Medicare Other | Attending: Cardiology | Admitting: Radiology

## 2013-09-09 DIAGNOSIS — I059 Rheumatic mitral valve disease, unspecified: Secondary | ICD-10-CM | POA: Insufficient documentation

## 2013-09-09 DIAGNOSIS — R0609 Other forms of dyspnea: Secondary | ICD-10-CM | POA: Insufficient documentation

## 2013-09-09 DIAGNOSIS — R0600 Dyspnea, unspecified: Secondary | ICD-10-CM

## 2013-09-09 DIAGNOSIS — R0989 Other specified symptoms and signs involving the circulatory and respiratory systems: Secondary | ICD-10-CM | POA: Insufficient documentation

## 2013-09-09 DIAGNOSIS — I359 Nonrheumatic aortic valve disorder, unspecified: Secondary | ICD-10-CM | POA: Insufficient documentation

## 2013-09-09 DIAGNOSIS — R0789 Other chest pain: Secondary | ICD-10-CM

## 2013-09-09 DIAGNOSIS — I079 Rheumatic tricuspid valve disease, unspecified: Secondary | ICD-10-CM | POA: Insufficient documentation

## 2013-09-09 DIAGNOSIS — I2510 Atherosclerotic heart disease of native coronary artery without angina pectoris: Secondary | ICD-10-CM

## 2013-09-09 DIAGNOSIS — Z951 Presence of aortocoronary bypass graft: Secondary | ICD-10-CM | POA: Insufficient documentation

## 2013-09-09 DIAGNOSIS — I10 Essential (primary) hypertension: Secondary | ICD-10-CM | POA: Insufficient documentation

## 2013-09-09 DIAGNOSIS — E785 Hyperlipidemia, unspecified: Secondary | ICD-10-CM | POA: Insufficient documentation

## 2013-09-09 NOTE — Progress Notes (Signed)
Echocardiogram performed.  

## 2013-09-24 ENCOUNTER — Ambulatory Visit (HOSPITAL_COMMUNITY): Payer: Medicare Other | Attending: Cardiology | Admitting: Radiology

## 2013-09-24 VITALS — BP 98/60 | HR 58 | Ht 65.0 in | Wt 258.0 lb

## 2013-09-24 DIAGNOSIS — R0789 Other chest pain: Secondary | ICD-10-CM

## 2013-09-24 DIAGNOSIS — E119 Type 2 diabetes mellitus without complications: Secondary | ICD-10-CM | POA: Insufficient documentation

## 2013-09-24 DIAGNOSIS — R002 Palpitations: Secondary | ICD-10-CM | POA: Insufficient documentation

## 2013-09-24 DIAGNOSIS — R079 Chest pain, unspecified: Secondary | ICD-10-CM | POA: Insufficient documentation

## 2013-09-24 DIAGNOSIS — R0602 Shortness of breath: Secondary | ICD-10-CM

## 2013-09-24 DIAGNOSIS — R0609 Other forms of dyspnea: Secondary | ICD-10-CM | POA: Insufficient documentation

## 2013-09-24 DIAGNOSIS — R42 Dizziness and giddiness: Secondary | ICD-10-CM | POA: Insufficient documentation

## 2013-09-24 DIAGNOSIS — Z8249 Family history of ischemic heart disease and other diseases of the circulatory system: Secondary | ICD-10-CM | POA: Insufficient documentation

## 2013-09-24 DIAGNOSIS — I2510 Atherosclerotic heart disease of native coronary artery without angina pectoris: Secondary | ICD-10-CM

## 2013-09-24 DIAGNOSIS — E785 Hyperlipidemia, unspecified: Secondary | ICD-10-CM | POA: Insufficient documentation

## 2013-09-24 DIAGNOSIS — R0989 Other specified symptoms and signs involving the circulatory and respiratory systems: Secondary | ICD-10-CM | POA: Insufficient documentation

## 2013-09-24 DIAGNOSIS — Z951 Presence of aortocoronary bypass graft: Secondary | ICD-10-CM | POA: Insufficient documentation

## 2013-09-24 DIAGNOSIS — R0600 Dyspnea, unspecified: Secondary | ICD-10-CM

## 2013-09-24 DIAGNOSIS — I10 Essential (primary) hypertension: Secondary | ICD-10-CM | POA: Insufficient documentation

## 2013-09-24 MED ORDER — TECHNETIUM TC 99M SESTAMIBI GENERIC - CARDIOLITE
33.0000 | Freq: Once | INTRAVENOUS | Status: AC | PRN
  Administered 2013-09-24: 33 via INTRAVENOUS

## 2013-09-24 MED ORDER — REGADENOSON 0.4 MG/5ML IV SOLN
0.4000 mg | Freq: Once | INTRAVENOUS | Status: AC
  Administered 2013-09-24: 0.4 mg via INTRAVENOUS

## 2013-09-24 NOTE — Progress Notes (Signed)
[facility] [address] [place], [place] [address] [phone]    Cardiology Nuclear Med Study  [name] is a 68 y.o. female     MRN : [id number]     DOB: 1945/12/01  Procedure Date: 09/24/2013  Nuclear Med Background Indication for Stress Test:  Evaluation for Ischemia and Graft Patency History:  CABG, '10 Echo: EF=55-60%, and 2011 Myocardial Perfusion Imaging-Normal, EF=69% Cardiac Risk Factors: Family History - CAD, Hypertension, Lipids and NIDDM  Symptoms: Chest Pain with/without exertion (last occurrence 3-4 weeks ago), Dizziness, DOE, Palpitations and SOB   Nuclear Pre-Procedure Caffeine/Decaff Intake:  None > 12 hrs NPO After: 7:00am   Lungs:  clear O2 Sat: 96% on room air. IV 0.9% NS with Angio Cath:  22g  IV Site: L Forearm x 1, tolerated well IV Started by:  Irven Baltimore, RN  Chest Size (in):  42 Cup Size: D  Height: 5\' 5"  (1.651 m)  Weight:  258 lb (117.028 kg)  BMI:  Body mass index is 42.93 kg/(m^2). 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    Nuclear Med Study 1 or 2 day study: 2 day  Stress Test Type:  Lexiscan  Reading MD: N/A  Order Authorizing Provider:  Peter Martinique, MD  Resting Radionuclide: Technetium 54m Sestamibi  Resting Radionuclide Dose: 33.0 mCi   ON     09-30-13  Stress Radionuclide:  Technetium 62m Sestamibi  Stress Radionuclide Dose: 33.0 mCi   ON       09-24-13          Stress Protocol Rest HR: 58 Stress HR: 74  Rest BP: 98/60 Stress BP: 117/52  Exercise Time (min): n/a METS: n/a   Predicted Max HR: 152 bpm % Max HR: 48.68 bpm Rate Pressure Product: 8658   Dose of Adenosine (mg):  n/a Dose of Lexiscan: 0.4 mg  Dose of Atropine (mg): n/a Dose of Dobutamine: n/a mcg/kg/min (at max HR)  Stress Test Technologist: Irven Baltimore, RN  Nuclear Technologist:  Charlton Amor, CNMT     Rest Procedure:  Myocardial perfusion imaging was performed at rest 45 minutes following the intravenous administration of  Technetium 31m Sestamibi. Rest ECG: NSR with non-specific ST-T wave changes  Stress Procedure:  The patient received IV Lexiscan 0.4 mg over 15-seconds.  Technetium 12m Sestamibi injected at 30-seconds. The patient complained of SOB, Head feeling funny, and Chest Pain with Lexiscan. Quantitative spect images were obtained after a 45 minute delay. Stress ECG: No significant change from baseline ECG  QPS Raw Data Images: Soft tissue (diaphragm, breast, bowel activity.  ) surround heart.   Stress Images: Apical thinning and very mild thinning in the inferolateral bse.   Otherwide normap perfusion.   Rest Images:  No significant changes from the stress images.   Subtraction (SDS):  No evidence of ischemia. Transient Ischemic Dilatation (Normal <1.22):  0.99 Lung/Heart Ratio (Normal <0.45):  0.33  Quantitative Gated Spect Images QGS EDV:  127 ml QGS ESV:  50 ml  Impression Exercise Capacity:  Lexiscan with no exercise. BP Response:  Normal blood pressure response. Clinical Symptoms:  Mild chest pain/dyspnea. ECG Impression:  No significant ST segment change suggestive of ischemia. Comparison with Prior Nuclear Study: Minimal change in the inferolateral base.    Overall Impression:  Probable normal perfusion and soft tissue attenuation  No significant ischemia or scar.    LV Ejection Fraction: 61%.  LV Wall Motion:  NL LV Function; NL Wall Motion

## 2013-09-25 ENCOUNTER — Encounter: Payer: Self-pay | Admitting: Cardiology

## 2013-09-26 ENCOUNTER — Other Ambulatory Visit: Payer: Self-pay | Admitting: Otolaryngology

## 2013-09-26 DIAGNOSIS — K1120 Sialoadenitis, unspecified: Secondary | ICD-10-CM

## 2013-09-30 ENCOUNTER — Ambulatory Visit (HOSPITAL_COMMUNITY): Payer: Medicare Other | Attending: Cardiology

## 2013-09-30 DIAGNOSIS — R0989 Other specified symptoms and signs involving the circulatory and respiratory systems: Secondary | ICD-10-CM

## 2013-09-30 MED ORDER — TECHNETIUM TC 99M SESTAMIBI GENERIC - CARDIOLITE
30.0000 | Freq: Once | INTRAVENOUS | Status: AC | PRN
  Administered 2013-09-30: 30 via INTRAVENOUS

## 2013-10-07 ENCOUNTER — Ambulatory Visit
Admission: RE | Admit: 2013-10-07 | Discharge: 2013-10-07 | Disposition: A | Discharge status: Home / Self Care | Payer: Medicare Other | Source: Ambulatory Visit | Attending: Otolaryngology | Admitting: Otolaryngology

## 2013-10-07 DIAGNOSIS — K1120 Sialoadenitis, unspecified: Secondary | ICD-10-CM

## 2013-10-07 MED ORDER — GADOBENATE DIMEGLUMINE 529 MG/ML IV SOLN
20.0000 mL | Freq: Once | INTRAVENOUS | Status: AC | PRN
  Administered 2013-10-07: 20 mL via INTRAVENOUS

## 2013-10-08 ENCOUNTER — Other Ambulatory Visit: Payer: Self-pay

## 2013-10-08 DIAGNOSIS — R0600 Dyspnea, unspecified: Secondary | ICD-10-CM

## 2013-10-14 ENCOUNTER — Other Ambulatory Visit: Payer: Self-pay | Admitting: Internal Medicine

## 2013-10-14 DIAGNOSIS — E041 Nontoxic single thyroid nodule: Secondary | ICD-10-CM

## 2013-10-16 ENCOUNTER — Institutional Professional Consult (permissible substitution): Payer: Medicare Other | Admitting: Internal Medicine

## 2013-10-16 ENCOUNTER — Ambulatory Visit
Admission: RE | Admit: 2013-10-16 | Discharge: 2013-10-16 | Disposition: A | Discharge status: Home / Self Care | Payer: Medicare Other | Source: Ambulatory Visit | Attending: Internal Medicine | Admitting: Internal Medicine

## 2013-10-16 DIAGNOSIS — E041 Nontoxic single thyroid nodule: Secondary | ICD-10-CM

## 2013-10-17 ENCOUNTER — Ambulatory Visit (INDEPENDENT_AMBULATORY_CARE_PROVIDER_SITE_OTHER): Payer: Medicare Other | Admitting: Pulmonary Disease

## 2013-10-17 ENCOUNTER — Encounter: Payer: Self-pay | Admitting: Pulmonary Disease

## 2013-10-17 VITALS — BP 152/80 | HR 62 | Temp 99.8°F | Ht 65.0 in | Wt 263.2 lb

## 2013-10-17 DIAGNOSIS — R0600 Dyspnea, unspecified: Secondary | ICD-10-CM

## 2013-10-17 DIAGNOSIS — R0989 Other specified symptoms and signs involving the circulatory and respiratory systems: Secondary | ICD-10-CM

## 2013-10-17 DIAGNOSIS — R0609 Other forms of dyspnea: Secondary | ICD-10-CM

## 2013-10-17 DIAGNOSIS — G4733 Obstructive sleep apnea (adult) (pediatric): Secondary | ICD-10-CM

## 2013-10-17 NOTE — Patient Instructions (Signed)
Breathing test shows mainly restriction Take asmanex 2  Puffs daily - rinse mouth after Use albuterol as needed for shortness of breath Home sleep test Take lasix if you see leg swelling

## 2013-10-17 NOTE — Assessment & Plan Note (Signed)
Dyspnea appears to be multifactorial-likely a combination of diastolic dysfunction, asthma and obesity causing deconditioning Breathing test shows mainly restriction Take asmanex 2  Puffs daily - rinse mouth after Use albuterol as needed for shortness of breath Take lasix if you see leg swelling

## 2013-10-17 NOTE — Assessment & Plan Note (Signed)
Given excessive daytime somnolence, narrow pharyngeal exam, witnessed apneas & loud snoring, obstructive sleep apnea is very likely & an overnight polysomnogram will be scheduled as a home study. The pathophysiology of obstructive sleep apnea , it's cardiovascular consequences & modes of treatment including CPAP were discused with the patient in detail & they evidenced understanding.  Home sleep test- since no major comorbidities

## 2013-10-17 NOTE — Progress Notes (Signed)
Subjective:    Patient ID: [name], female    DOB: 05-09-1946, 68 y.o.   MRN: [id number]  HPI   68 year old never smoker presents for evaluation of dyspnea and sleep disorder breathing. She reports shortness of breath there has been worsening over the past 3 months but ongoing for more than a year. This is worse on walking and relieved by resting, her activity is also restricted by arthritis. She underwent bilateral knee replacements in 2014.  She is status post CABG in 2005. Nuclear stress test in December of 2011 was normal. Echo -suggested diastolic dysfunction with elevated EDP. Pulmonary artery pressure was normal. She reports asthma since her 28s, and a strong family history in a sister and 2 daughters. She takes Asmanex one puff a day with some relief and uses albuterol about once a week. She has required prednisone last more than a year ago for asthma flareup, but denies hospitalization or ER visits. Spirometry showed moderate restriction with a ratio 77, FEV 1.45-62% and FVC 1.87-62%. There is no evidence of airway obstruction  Snoring has been noted by family members, her weight is unchanged around 260 pounds over the last 2 years. Is no history of witnessed apneas but she does report excessive daytime somnolence and fatigue  He is maintained on weekly methotrexate and hydroxychloroquine for rheumatoid arthritis. Meds reviewed also shows benazepril-she denies cough or constant throat clearing.   Past Medical History  Diagnosis Date  . Cancer     breast  . Dyspnea   . Asthma   . Coronary artery disease   . Diabetes mellitus   . Hypertension   . Hyperlipidemia   . Obesity   . Breast cancer   . OA (osteoarthritis) of knee   . Depression   . Renal calculi   . GERD (gastroesophageal reflux disease)   . Diabetes mellitus type II   . Cataract   . Asthma    Past Surgical History  Procedure Laterality Date  . Triple bypass  04/27/05  . Cardiac catheterization   04/23/2004    EF 60%  . Coronary artery bypass graft  2005    LIMA GRAFT TO LAD, SAPHENOUS VEIN GRAFT TO THE FIRST DIAGONAL, AND LEFT RADIAL ARTERY GRAFT TO THE OM  . Breast lumpectomy      RIGHT BREAST  . Tubal ligation    . Wrist surgery    . US echocardiography  01/06/2009    EF 55-60%  . Cardiovascular stress test  07/12/2010    EF 69%  . Total knee arthroplasty      left  . Joint replacement      No Known Allergies  History   Social History  . Marital Status: Widowed    Spouse Name: N/A    Number of Children: 2  . Years of Education: N/A   Occupational History  . retired    Social History Main Topics  . Smoking status: Never Smoker   . Smokeless tobacco: Not on file  . Alcohol Use: No  . Drug Use: No  . Sexual Activity: Not on file   Other Topics Concern  . Not on file   Social History Narrative  . No narrative on file     Family History  Problem Relation Age of Onset  . Heart disease Mother   . Cancer Father   . Cancer Brother   . Heart disease Brother      Review of Systems  Constitutional: Negative for fever  and unexpected weight change.  HENT: Positive for congestion, ear pain and sinus pressure. Negative for dental problem, nosebleeds, postnasal drip, rhinorrhea, sneezing, sore throat and trouble swallowing.   Eyes: Negative for redness and itching.  Respiratory: Positive for cough, chest tightness and shortness of breath. Negative for wheezing.   Cardiovascular: Positive for palpitations. Negative for leg swelling.  Gastrointestinal: Negative for nausea and vomiting.  Genitourinary: Negative for dysuria.  Musculoskeletal: Negative for joint swelling.  Skin: Negative for rash.  Neurological: Positive for headaches.  Hematological: Does not bruise/bleed easily.  Psychiatric/Behavioral: Negative for dysphoric mood. The patient is not nervous/anxious.        Objective:   Physical Exam  Gen. Pleasant, obese, in no distress, normal  affect ENT - no lesions, no post nasal drip, class 2-3 airway Neck: No JVD, no thyromegaly, no carotid bruits Lungs: no use of accessory muscles, no dullness to percussion, decreased without rales or rhonchi  Cardiovascular: Rhythm regular, heart sounds  normal, no murmurs or gallops, no peripheral edema Abdomen: soft and non-tender, no hepatosplenomegaly, BS normal. Musculoskeletal: No deformities, no cyanosis or clubbing Neuro:  alert, non focal, no tremors        Assessment & Plan:

## 2013-11-27 DIAGNOSIS — G4733 Obstructive sleep apnea (adult) (pediatric): Secondary | ICD-10-CM

## 2013-12-04 ENCOUNTER — Telehealth: Payer: Self-pay | Admitting: Pulmonary Disease

## 2013-12-04 NOTE — Telephone Encounter (Signed)
HST showed mod OSA- AHI 25/h Suggest CPAP titration study

## 2013-12-05 DIAGNOSIS — G4733 Obstructive sleep apnea (adult) (pediatric): Secondary | ICD-10-CM

## 2013-12-05 NOTE — Telephone Encounter (Signed)
I spoke with patient about results and he verbalized understanding. She wants to think about this and will call me back and let me what she decides to do.  Nothing further needed

## 2013-12-06 ENCOUNTER — Encounter: Payer: Self-pay | Admitting: Pulmonary Disease

## 2013-12-18 ENCOUNTER — Ambulatory Visit: Payer: Medicare Other | Admitting: Adult Health

## 2013-12-25 ENCOUNTER — Ambulatory Visit (HOSPITAL_BASED_OUTPATIENT_CLINIC_OR_DEPARTMENT_OTHER): Payer: Medicare Other | Admitting: Hematology & Oncology

## 2013-12-25 ENCOUNTER — Other Ambulatory Visit (HOSPITAL_BASED_OUTPATIENT_CLINIC_OR_DEPARTMENT_OTHER): Payer: Medicare Other | Admitting: Lab

## 2013-12-25 VITALS — BP 129/79 | HR 56 | Temp 98.1°F | Resp 18 | Wt 257.0 lb

## 2013-12-25 DIAGNOSIS — C50911 Malignant neoplasm of unspecified site of right female breast: Secondary | ICD-10-CM

## 2013-12-25 DIAGNOSIS — Z853 Personal history of malignant neoplasm of breast: Secondary | ICD-10-CM

## 2013-12-25 DIAGNOSIS — C50519 Malignant neoplasm of lower-outer quadrant of unspecified female breast: Secondary | ICD-10-CM

## 2013-12-25 DIAGNOSIS — M810 Age-related osteoporosis without current pathological fracture: Secondary | ICD-10-CM

## 2013-12-25 DIAGNOSIS — M1710 Unilateral primary osteoarthritis, unspecified knee: Secondary | ICD-10-CM

## 2013-12-25 DIAGNOSIS — M179 Osteoarthritis of knee, unspecified: Secondary | ICD-10-CM

## 2013-12-25 LAB — CBC WITH DIFFERENTIAL (CANCER CENTER ONLY)
BASO#: 0 10*3/uL (ref 0.0–0.2)
BASO%: 0.2 % (ref 0.0–2.0)
EOS%: 0.9 % (ref 0.0–7.0)
Eosinophils Absolute: 0.1 10*3/uL (ref 0.0–0.5)
HEMATOCRIT: 40.6 % (ref 34.8–46.6)
HGB: 13.5 g/dL (ref 11.6–15.9)
LYMPH#: 2.3 10*3/uL (ref 0.9–3.3)
LYMPH%: 35.1 % (ref 14.0–48.0)
MCH: 31.2 pg (ref 26.0–34.0)
MCHC: 33.3 g/dL (ref 32.0–36.0)
MCV: 94 fL (ref 81–101)
MONO#: 0.6 10*3/uL (ref 0.1–0.9)
MONO%: 9.3 % (ref 0.0–13.0)
NEUT#: 3.6 10*3/uL (ref 1.5–6.5)
NEUT%: 54.5 % (ref 39.6–80.0)
Platelets: 163 10*3/uL (ref 145–400)
RBC: 4.33 10*6/uL (ref 3.70–5.32)
RDW: 14.3 % (ref 11.1–15.7)
WBC: 6.6 10*3/uL (ref 3.9–10.0)

## 2013-12-25 NOTE — Progress Notes (Signed)
CC:   Mark A. Perini, M.D.  DIAGNOSIS:  Stage I (T1 N0 M0) ductal carcinoma of the right breast.  CURRENT THERAPY:  Observation.  INTERIM HISTORY:  [name] comes in for followup.  We see her once a year.  She is doing okay.  Unfortunately, her older sister is not doing too well.  Her older sister is having some brain issues from trauma, and [name] is taking care of her.  She is complaining of some pain in the right shoulder. She has been to some physical therapy. I think that we may need to consider some physical therapy for 1 minute had breast cancer and lymph node removal. She hadn't been on Lyrica. She cannot afford is because of the increasing co-pays.  [name] does have diabetes.  She also has other health issues.  She is trying to watch her weight.  She does have, I think, fibromyalgia. She does have arthritic issues.  She has rheumatoid arthritis.  She is on Plaquenil.  She has had no bleeding or bruising.  There has been no change in bowel or bladder habits.  She is due for a mammogram in the next month or so.  Her last mammogram was done back in June 2013.  She has had no cough.  She had no headache.  PHYSICAL EXAMINATION:  General:  This is an obese white female in no obvious distress.  Vital signs:  Temperature of 98.1, pulse 56, respiratory rate 16, blood pressure 127/79.  Weight is 257.  Head and neck:  Normocephalic, atraumatic skull.  There are no ocular or oral lesions.  There are no palpable cervical or supraclavicular lymph nodes. Lungs:  Clear bilaterally.  Cardiac:  Regular rate and rhythm, with a normal S1, S2.  There are no murmurs, rubs or bruits.  Abdomen:  Soft, moderately obese.  She has good bowel sounds.  There is no fluid wave. There is no palpable hepatosplenomegaly.  Breasts:  Show left breast with no masses, edema or erythema.  There is no left axillary adenopathy.  Right breast is contracted from radiation surgery.  She does have the  lumpectomy scar that is quite firm and surrounded by radiation telangiectasias adjacent to the areola at about the 7 o'clock position.  Again, there is firmness about the right breast.  No distinct masses noted in the right breast.  There is no right axillary adenopathy.  Back:  Shows no tenderness over the spine, ribs, or hips. Extremities:  Show some dermatitis from her diabetes.  This is in the lower legs.  No edema is noted in the legs.  The joints do not appear swollen.  Skin:  Shows the diabetic rash in the lower legs.  She may have some stasis dermatitis changes in the lower legs.  LABORATORY STUDIES:  White cell count is 6.6 hemoglobin 13.5, hematocrit 41.6, platelet count 163,000.  IMPRESSION:  [name] is a 68 year old white female with a history of stage I ductal carcinoma of the right breast.  She now is out from lumpectomy.  She had this about 16 years ago.  I'll see about getting  her a referral over to physical therapy that deals with women with breast cancer and see if they cannot help her out with some of the right shoulder issues.  She still likes to come back to see [place].  We will plan to get her back yearly.

## 2013-12-26 LAB — COMPREHENSIVE METABOLIC PANEL
ALBUMIN: 4.5 g/dL (ref 3.5–5.2)
ALT: 27 U/L (ref 0–35)
AST: 22 U/L (ref 0–37)
Alkaline Phosphatase: 42 U/L (ref 39–117)
BILIRUBIN TOTAL: 0.5 mg/dL (ref 0.2–1.2)
BUN: 17 mg/dL (ref 6–23)
CO2: 29 mEq/L (ref 19–32)
Calcium: 9.6 mg/dL (ref 8.4–10.5)
Chloride: 101 mEq/L (ref 96–112)
Creatinine, Ser: 0.61 mg/dL (ref 0.50–1.10)
GLUCOSE: 106 mg/dL — AB (ref 70–99)
Potassium: 4.3 mEq/L (ref 3.5–5.3)
Sodium: 140 mEq/L (ref 135–145)
TOTAL PROTEIN: 7 g/dL (ref 6.0–8.3)

## 2013-12-26 LAB — VITAMIN D 25 HYDROXY (VIT D DEFICIENCY, FRACTURES): Vit D, 25-Hydroxy: 38 ng/mL (ref 30–89)

## 2013-12-27 ENCOUNTER — Telehealth: Payer: Self-pay | Admitting: Nurse Practitioner

## 2013-12-27 NOTE — Telephone Encounter (Addendum)
Message copied by Jimmy Footman on Fri Dec 27, 2013  2:28 PM ------      Message from: Burney Gauze R      Created: Thu Dec 26, 2013  6:12 PM       Call - vit D is a little low.  How much Vit D is she taking?? Please let me know face to face!!  Laurey Arrow ------Pt states she is not actively taking any Vit D supplements. Per Dr. Marin Olp pt instructed to begin taking 2000u/day. She verbalized appreciation and understanding.

## 2013-12-30 ENCOUNTER — Encounter: Payer: Self-pay | Admitting: Adult Health

## 2013-12-30 ENCOUNTER — Ambulatory Visit (INDEPENDENT_AMBULATORY_CARE_PROVIDER_SITE_OTHER): Payer: Medicare Other | Admitting: Adult Health

## 2013-12-30 VITALS — BP 130/80 | HR 62 | Temp 97.9°F | Ht 65.0 in | Wt 260.0 lb

## 2013-12-30 DIAGNOSIS — R0609 Other forms of dyspnea: Secondary | ICD-10-CM

## 2013-12-30 DIAGNOSIS — R0989 Other specified symptoms and signs involving the circulatory and respiratory systems: Secondary | ICD-10-CM

## 2013-12-30 DIAGNOSIS — G4733 Obstructive sleep apnea (adult) (pediatric): Secondary | ICD-10-CM

## 2013-12-30 DIAGNOSIS — R0600 Dyspnea, unspecified: Secondary | ICD-10-CM

## 2013-12-30 NOTE — Assessment & Plan Note (Signed)
DOE ? multifactoral in nature  Spirometry more restrictive changes ?obesity related Will cont asmanex for now  Pt is on plaquenil /MTX and ACE inhibitor  If symptoms persist may need to complete full PFT and consider CT chest looking for possible underlying abn.

## 2013-12-30 NOTE — Progress Notes (Signed)
Subjective:    Patient ID: DEAUNA YAW, female    DOB: 1946-07-22, 68 y.o.   MRN: [id number]  HPI 68 year old never smoker presents for evaluation of dyspnea and sleep disorder breathing. She reports shortness of breath there has been worsening over the past 3 months but ongoing for more than a year. This is worse on walking and relieved by resting, her activity is also restricted by arthritis. She underwent bilateral knee replacements in 2014.  She is status post CABG in 2005. Nuclear stress test in December of 2011 was normal. Echo -suggested diastolic dysfunction with elevated EDP. Pulmonary artery pressure was normal. She reports asthma since her 55s, and a strong family history in a sister and 2 daughters. She takes Asmanex one puff a day with some relief and uses albuterol about once a week. She has required prednisone last more than a year ago for asthma flareup, but denies hospitalization or ER visits. Spirometry showed moderate restriction with a ratio 77, FEV 1.45-62% and FVC 1.87-62%. There is no evidence of airway obstruction  Snoring has been noted by family members, her weight is unchanged around 260 pounds over the last 2 years. Is no history of witnessed apneas but she does report excessive daytime somnolence and fatigue  He is maintained on weekly methotrexate and hydroxychloroquine for rheumatoid arthritis. Meds reviewed also shows benazepril-she denies cough or constant throat clearing.  12/30/2013 Follow up  Returns for follow up to discuss home sleep study  Results. Home sleep study showed mod OSA (AHI 25/h)  W/ significant desats. Does have daytime sleepiness. And low energy.  We discussed need for nocturnal CPAP .  She has agreed to a CPAP titration study.  We discussed effects of OSA and need for wt los.    She was also Started on Asmanex last ov . Says her DOE is about the same . No flare in cough or wheezing.  Spirometry showed moderate restriction with a ratio 77,  FEV 1.45-62% and FVC 1.87-62%. There is no evidence of airway obstruction She is Never smoker. She has hx of RA  On MTX and Plaquenil.  cxr 06/2013 showed Hyperexpanded lungs and bronchitic change  Says her back and legs are very limiting for her with chronic pain.       Review of Systems Constitutional:   No  weight loss, night sweats,  Fevers, chills,  +fatigue, or  lassitude.  HEENT:   No headaches,  Difficulty swallowing,  Tooth/dental problems, or  Sore throat,                No sneezing, itching, ear ache,  +nasal congestion, post nasal drip,   CV:  No chest pain,  Orthopnea, PND, swelling in lower extremities, anasarca, dizziness, palpitations, syncope.   GI  No heartburn, indigestion, abdominal pain, nausea, vomiting, diarrhea, change in bowel habits, loss of appetite, bloody stools.   Resp:  .  No chest wall deformity  Skin: no rash or lesions.  GU: no dysuria, change in color of urine, no urgency or frequency.  No flank pain, no hematuria   MS:  No joint pain or swelling.  No decreased range of motion.  No back pain.  Psych:  No change in mood or affect. No depression or anxiety.  No memory loss.         Objective:   Physical Exam GEN: A/Ox3; pleasant , NAD, obese   HEENT:  [place]/AT,  EACs-clear, TMs-wnl, NOSE-clear, THROAT-clear, no lesions, no postnasal drip  or exudate noted.   NECK:  Supple w/ fair ROM; no JVD; normal carotid impulses w/o bruits; no thyromegaly or nodules palpated; no lymphadenopathy.  RESP  Decreased BS in bases no accessory muscle use, no dullness to percussion  CARD:  RRR, no m/r/g  , tr peripheral edema, pulses intact, no cyanosis or clubbing.  GI:   Soft & nt; nml bowel sounds; no organomegaly or masses detected.  Musco: Warm bil, no deformities or joint swelling noted.   Neuro: alert, no focal deficits noted.    Skin: Warm, no lesions or rashes         Assessment & Plan:

## 2013-12-30 NOTE — Assessment & Plan Note (Signed)
OSA Home sleep study showed mod OSA (AHI 25/h)  W/ desats   Plan  Set up for CPAP titration study  Wt loss encouraged.

## 2013-12-30 NOTE — Patient Instructions (Signed)
We will set up a CPAP titration study  Keep working on weight loss.  Continue on Asmanex .  Follow up Dr. Elsworth Soho  In 6 weeks and As needed   Please contact office for sooner follow up if symptoms do not improve or worsen or seek emergency care

## 2013-12-31 NOTE — Progress Notes (Signed)
Reviewed & agree with plan  

## 2014-02-28 ENCOUNTER — Encounter (HOSPITAL_BASED_OUTPATIENT_CLINIC_OR_DEPARTMENT_OTHER): Payer: Medicare Other

## 2014-03-13 ENCOUNTER — Other Ambulatory Visit: Payer: Self-pay

## 2014-03-13 DIAGNOSIS — Z853 Personal history of malignant neoplasm of breast: Secondary | ICD-10-CM

## 2014-03-13 DIAGNOSIS — Z1231 Encounter for screening mammogram for malignant neoplasm of breast: Secondary | ICD-10-CM

## 2014-03-13 DIAGNOSIS — Z9889 Other specified postprocedural states: Secondary | ICD-10-CM

## 2014-03-25 ENCOUNTER — Ambulatory Visit
Admission: RE | Admit: 2014-03-25 | Discharge: 2014-03-25 | Disposition: A | Discharge status: Home / Self Care | Payer: Medicare Other | Source: Ambulatory Visit

## 2014-03-25 ENCOUNTER — Encounter (INDEPENDENT_AMBULATORY_CARE_PROVIDER_SITE_OTHER): Payer: Self-pay

## 2014-03-25 DIAGNOSIS — Z9889 Other specified postprocedural states: Secondary | ICD-10-CM

## 2014-03-25 DIAGNOSIS — Z853 Personal history of malignant neoplasm of breast: Secondary | ICD-10-CM

## 2014-03-25 DIAGNOSIS — Z1231 Encounter for screening mammogram for malignant neoplasm of breast: Secondary | ICD-10-CM

## 2014-07-28 ENCOUNTER — Encounter: Payer: Self-pay | Admitting: Cardiology

## 2014-07-28 ENCOUNTER — Ambulatory Visit (INDEPENDENT_AMBULATORY_CARE_PROVIDER_SITE_OTHER): Payer: PPO | Admitting: Cardiology

## 2014-07-28 VITALS — BP 150/90 | HR 64 | Ht 65.0 in | Wt 260.2 lb

## 2014-07-28 DIAGNOSIS — G4733 Obstructive sleep apnea (adult) (pediatric): Secondary | ICD-10-CM

## 2014-07-28 DIAGNOSIS — I10 Essential (primary) hypertension: Secondary | ICD-10-CM

## 2014-07-28 DIAGNOSIS — R0600 Dyspnea, unspecified: Secondary | ICD-10-CM

## 2014-07-28 DIAGNOSIS — I2510 Atherosclerotic heart disease of native coronary artery without angina pectoris: Secondary | ICD-10-CM

## 2014-07-28 NOTE — Progress Notes (Signed)
[name] Date of Birth: 1946-04-29   History of Present Illness: [name] is seen for followup of CAD. She is s/p CABG in 2005. Myoview in March 2015 was normal. When seen at that time she complained of increased dyspnea. Echo was unremarkable. She was seen by Dr. Elsworth Soho and diagnosed with OSA by at home testing. She did not follow through with formal sleep study and CPAP titration. She states her breathing is the same. No change in weight. No chest pain.  Current Outpatient Prescriptions on File Prior to Visit  Medication Sig Dispense Refill  . albuterol (PROVENTIL HFA;VENTOLIN HFA) 108 (90 BASE) MCG/ACT inhaler Inhale 2 puffs into the lungs every 4 (four) hours as needed for wheezing or shortness of breath (cough, shortness of breath or wheezing.). 1 Inhaler 12  . ALFALFA PO Take by mouth 2 (two) times daily.      Marland Kitchen amLODipine (NORVASC) 5 MG tablet Take 5 mg by mouth Daily.     Marland Kitchen amLODipine-benazepril (LOTREL) 5-20 MG per capsule Take 1 capsule by mouth daily.     Marland Kitchen aspirin 81 MG tablet Take 81 mg by mouth daily.      Marland Kitchen b complex vitamins tablet Take 1 tablet by mouth 2 (two) times daily.      . benazepril (LOTENSIN) 20 MG tablet Take 20 mg by mouth daily.     Marland Kitchen buPROPion (WELLBUTRIN SR) 150 MG 12 hr tablet Take 150 mg by mouth daily.      . citalopram (CELEXA) 20 MG tablet Take 20 mg by mouth daily.      . Flaxseed, Linseed, (FLAX SEED OIL PO) Take 1,000 mg by mouth 2 (two) times daily.      . furosemide (LASIX) 20 MG tablet Take 20 mg by mouth as needed.      . gabapentin (NEURONTIN) 300 MG capsule Take 1 capsule by mouth 3 (three) times daily.    . hydrochlorothiazide 25 MG tablet Take 25 mg by mouth daily.      . hydroxychloroquine (PLAQUENIL) 200 MG tablet Take 200 mg by mouth daily.     . meloxicam (MOBIC) 15 MG tablet Take 15 mg by mouth daily.      . methotrexate (RHEUMATREX) 5 MG tablet Take 8 tablets every Friday.    Caution: Chemotherapy. Protect from light.    .  mometasone (ASMANEX) 220 MCG/INH inhaler Inhale 2 puffs into the lungs daily.      . Multiple Vitamin (MULTIVITAMIN) tablet Take 1 tablet by mouth daily.      . nebivolol (BYSTOLIC) 10 MG tablet Take 10 mg by mouth daily.      Marland Kitchen omeprazole (PRILOSEC) 20 MG capsule Take 1 capsule by mouth daily.    Marland Kitchen POTASSIUM PO Take by mouth 2 (two) times daily.     Marland Kitchen VITAMIN E PO Take 200 mg by mouth daily.      No current facility-administered medications on file prior to visit.    Not on File  Past Medical History  Diagnosis Date  . Cancer     breast  . Dyspnea   . Asthma   . Coronary artery disease   . Diabetes mellitus   . Hypertension   . Hyperlipidemia   . Obesity   . Breast cancer   . OA (osteoarthritis) of knee   . Depression   . Renal calculi   . GERD (gastroesophageal reflux disease)   . Diabetes mellitus type II   . Cataract   .  Asthma   . OSA (obstructive sleep apnea)     Past Surgical History  Procedure Laterality Date  . Triple bypass  04/27/05  . Cardiac catheterization  04/23/2004    EF 60%  . Coronary artery bypass graft  2005    LIMA GRAFT TO LAD, SAPHENOUS VEIN GRAFT TO THE FIRST DIAGONAL, AND LEFT RADIAL ARTERY GRAFT TO THE OM  . Breast lumpectomy      RIGHT BREAST  . Tubal ligation    . Wrist surgery    . US echocardiography  01/06/2009    EF 55-60%  . Cardiovascular stress test  07/12/2010    EF 69%  . Total knee arthroplasty      left  . Joint replacement      History  Smoking status  . Never Smoker   Smokeless tobacco  . Not on file    History  Alcohol Use No    Family History  Problem Relation Age of Onset  . Heart disease Mother   . Cancer Father   . Cancer Brother   . Heart disease Brother     Review of Systems: As noted history of present illness.  All other systems were reviewed and are negative.  Physical Exam: BP 150/90 mmHg  Pulse 64  Ht 5\' 5"  (1.651 m)  Wt 260 lb 3.2 oz (118.026 kg)  BMI 43.30 kg/m2 She is a pleasant  obese white female in no acute distress. Her HEENT exam is unremarkable. She is normocephalic, atraumatic. Pupils are equal round and reactive to light and accommodation. Extraocular movements are full. Oropharynx is clear. Neck is supple without JVD, adenopathy, thyromegaly, or bruits. Lungs are clear. Cardiac exam reveals a regular rate and rhythm without gallop, murmur, or click. Abdomen is obese, soft, nontender. She has trace lower extremity edema. She has arthritic changes in her knees. She is alert and oriented x3. Cranial nerves II through XII are intact.  LABORATORY DATA: ECG today shows normal sinus rhythm with a nonspecific ST  Abnormality. Rate 64. I have personally reviewed and interpreted this study.  Echo: 2/16/15Study Conclusions  - Left ventricle: The cavity size was normal. Wall thickness was normal. Systolic function was normal. The estimated ejection fraction was in the range of 50% to 55%. - Mitral valve: Nodular calcification of anterior leaflet tip Mild regurgitation. - Left atrium: The atrium was moderately dilated. - Atrial septum: No defect or patent foramen ovale was identified. - Pericardium, extracardiac: A trivial pericardial effusion was identified posterior to the heart. - Impressions: Elevated E/E' ratio suggested diastolic dysfunction with elevated EDP Impressions:  - Elevated E/E' ratio suggested diastolic dysfunction with elevated EDP  Cardiology Nuclear Med Study  [name] is a 70 y.o. female MRN : [id number] DOB: Sep 05, 1945  Procedure Date: 09/24/2013  Nuclear Med Background Indication for Stress Test: Evaluation for Ischemia and Graft Patency History: CABG, '10 Echo: EF=55-60%, and 2011 Myocardial Perfusion Imaging-Normal, EF=69% Cardiac Risk Factors: Family History - CAD, Hypertension, Lipids and NIDDM  Symptoms: Chest Pain with/without exertion (last occurrence 3-4 weeks ago), Dizziness, DOE, Palpitations and  SOB   Nuclear Pre-Procedure Caffeine/Decaff Intake: None > 12 hrs NPO After: 7:00am   Lungs: clear O2 Sat: 96% on room air. IV 0.9% NS with Angio Cath: 22g  IV Site: L Forearm x 1, tolerated well IV Started by: Irven Baltimore, RN  Chest Size (in): 42 Cup Size: D  Height: 5\' 5"  (1.651 m)  Weight: 258 lb (117.028 kg)  BMI: Body  mass index is 42.93 kg/(m^2). 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    Nuclear Med Study 1 or 2 day study: 2 day  Stress Test Type: Lexiscan  Reading MD: N/A  Order Authorizing Provider: Xzavion Doswell [place], MD  Resting Radionuclide: Technetium 67m Sestamibi  Resting Radionuclide Dose: 33.0 mCi ON  09-30-13  Stress Radionuclide: Technetium 52m Sestamibi  Stress Radionuclide Dose: 33.0 mCi ON  09-24-13     Stress Protocol Rest HR: 58 Stress HR: 74  Rest BP: 98/60 Stress BP: 117/52  Exercise Time (min): n/a METS: n/a   Predicted Max HR: 152 bpm % Max HR: 48.68 bpm Rate Pressure Product: 8658   Dose of Adenosine (mg): n/a Dose of Lexiscan: 0.4 mg  Dose of Atropine (mg): n/a Dose of Dobutamine: n/a mcg/kg/min (at max HR)  Stress Test Technologist: Irven Baltimore, RN  Nuclear Technologist: Charlton Amor, CNMT     Rest Procedure: Myocardial perfusion imaging was performed at rest 45 minutes following the intravenous administration of Technetium 52m Sestamibi. Rest ECG: NSR with non-specific ST-T wave changes  Stress Procedure: The patient received IV Lexiscan 0.4 mg over 15-seconds. Technetium 40m Sestamibi injected at 30-seconds. The patient complained of SOB, Head feeling funny, and Chest Pain with Lexiscan. Quantitative spect images were obtained after a 45 minute delay. Stress ECG: No significant change from baseline ECG  QPS Raw Data Images: Soft tissue (diaphragm, breast, bowel activity. ) surround heart.  Stress Images: Apical thinning and very mild thinning in the inferolateral bse.  Otherwide normap perfusion.  Rest Images: No significant changes from the stress images.  Subtraction (SDS): No evidence of ischemia. Transient Ischemic Dilatation (Normal <1.22): 0.99 Lung/Heart Ratio (Normal <0.45): 0.33  Quantitative Gated Spect Images QGS EDV: 127 ml QGS ESV: 50 ml  Impression Exercise Capacity: Lexiscan with no exercise. BP Response: Normal blood pressure response. Clinical Symptoms: Mild chest pain/dyspnea. ECG Impression: No significant ST segment change suggestive of ischemia. Comparison with Prior Nuclear Study: Minimal change in the inferolateral base.   Overall Impression: Probable normal perfusion and soft tissue attenuation No significant ischemia or scar.   LV Ejection Fraction: 61%. LV Wall Motion: NL LV Function; NL Wall Motion     Assessment / Plan: 1. Coronary disease.  She is status post CABG in 2005. Nuclear stress test in March 2015 was normal. No chest pain. Continue medical therapy.  2. Hypertension, controlled.  3. Hyperlipidemia.  4. Diabetes mellitus type 2.  5. Morbid obesity with OSA  6. Dyspnea on exertion. Multifactorial. Diastolic dysfunction without significant volume overload. Morbid obesity with OSA. She is poorly motivated to lose weight or to have OSA treated.   I will follow up in one year.

## 2014-07-28 NOTE — Patient Instructions (Signed)
Continue your current therapy  You should reconsider using CPAP therapy  I will see you in one year.

## 2014-10-21 ENCOUNTER — Other Ambulatory Visit: Payer: Self-pay | Admitting: Obstetrics and Gynecology

## 2014-10-22 LAB — CYTOLOGY - PAP

## 2014-12-24 ENCOUNTER — Encounter: Payer: Self-pay | Admitting: Hematology & Oncology

## 2014-12-24 ENCOUNTER — Ambulatory Visit (HOSPITAL_BASED_OUTPATIENT_CLINIC_OR_DEPARTMENT_OTHER): Payer: PPO | Admitting: Hematology & Oncology

## 2014-12-24 ENCOUNTER — Other Ambulatory Visit (HOSPITAL_BASED_OUTPATIENT_CLINIC_OR_DEPARTMENT_OTHER): Payer: PPO

## 2014-12-24 VITALS — BP 153/72 | HR 59 | Temp 98.6°F | Resp 18 | Ht 65.0 in | Wt 258.0 lb

## 2014-12-24 DIAGNOSIS — C50519 Malignant neoplasm of lower-outer quadrant of unspecified female breast: Secondary | ICD-10-CM

## 2014-12-24 DIAGNOSIS — C50511 Malignant neoplasm of lower-outer quadrant of right female breast: Secondary | ICD-10-CM

## 2014-12-24 DIAGNOSIS — Z853 Personal history of malignant neoplasm of breast: Secondary | ICD-10-CM

## 2014-12-24 LAB — COMPREHENSIVE METABOLIC PANEL WITH GFR
ALT: 27 U/L (ref 0–35)
AST: 27 U/L (ref 0–37)
Albumin: 4.2 g/dL (ref 3.5–5.2)
Alkaline Phosphatase: 42 U/L (ref 39–117)
BUN: 14 mg/dL (ref 6–23)
CO2: 27 meq/L (ref 19–32)
Calcium: 9.2 mg/dL (ref 8.4–10.5)
Chloride: 101 meq/L (ref 96–112)
Creatinine, Ser: 0.59 mg/dL (ref 0.50–1.10)
Glucose, Bld: 115 mg/dL — ABNORMAL HIGH (ref 70–99)
Potassium: 4 meq/L (ref 3.5–5.3)
Sodium: 142 meq/L (ref 135–145)
Total Bilirubin: 0.5 mg/dL (ref 0.2–1.2)
Total Protein: 6.7 g/dL (ref 6.0–8.3)

## 2014-12-24 LAB — CBC WITH DIFFERENTIAL (CANCER CENTER ONLY)
BASO#: 0 10e3/uL (ref 0.0–0.2)
BASO%: 0.3 % (ref 0.0–2.0)
EOS%: 0.9 % (ref 0.0–7.0)
Eosinophils Absolute: 0.1 10e3/uL (ref 0.0–0.5)
HCT: 40.1 % (ref 34.8–46.6)
HGB: 13.2 g/dL (ref 11.6–15.9)
LYMPH#: 2.1 10e3/uL (ref 0.9–3.3)
LYMPH%: 33.1 % (ref 14.0–48.0)
MCH: 31.4 pg (ref 26.0–34.0)
MCHC: 32.9 g/dL (ref 32.0–36.0)
MCV: 95 fL (ref 81–101)
MONO#: 0.7 10e3/uL (ref 0.1–0.9)
MONO%: 10.5 % (ref 0.0–13.0)
NEUT#: 3.5 10e3/uL (ref 1.5–6.5)
NEUT%: 55.2 % (ref 39.6–80.0)
Platelets: 176 10e3/uL (ref 145–400)
RBC: 4.21 10e6/uL (ref 3.70–5.32)
RDW: 14.1 % (ref 11.1–15.7)
WBC: 6.4 10e3/uL (ref 3.9–10.0)

## 2014-12-24 NOTE — Progress Notes (Signed)
Hematology and Oncology Follow Up Visit  [name] [id number] August 20, 1945 69 y.o. 12/24/2014   Principle Diagnosis:  Stage  I (T1N0M0) ductal carcinoma the right breast   Current Therapy:    Observation     Interim History:  [name] is back for follow-up. We see her yearly. She's been doing okay. She is on oxygen at nighttime. She does have sleep apnea. She cannot use a CPAP device.  She also is on the cholesterol injections. She had some problems with these.  She has lower leg swelling. This might be from the amlodipine that she is on. Patient also has some asthma. She is on inhalers for this.  She's had no change in bowel or bladder habits.  She's had no nausea or vomiting.  There's been no cough or shortness of breath.  She has rheumatoid arthritis. She is on methotrexate injections. She has a rheumatoid nodule just distal to the elbow on the left arm.  Overall, her performance status is ECOG 1.  Medications:  Current outpatient prescriptions:  .  albuterol (PROVENTIL HFA;VENTOLIN HFA) 108 (90 BASE) MCG/ACT inhaler, Inhale 2 puffs into the lungs every 4 (four) hours as needed for wheezing or shortness of breath (cough, shortness of breath or wheezing.)., Disp: 1 Inhaler, Rfl: 12 .  ALFALFA PO, Take by mouth 2 (two) times daily.  , Disp: , Rfl:  .  amLODipine (NORVASC) 5 MG tablet, Take 5 mg by mouth Daily. , Disp: , Rfl:  .  amLODipine-benazepril (LOTREL) 5-20 MG per capsule, Take 1 capsule by mouth daily. , Disp: , Rfl:  .  aspirin 81 MG tablet, Take 81 mg by mouth daily.  , Disp: , Rfl:  .  b complex vitamins tablet, Take 1 tablet by mouth 2 (two) times daily.  , Disp: , Rfl:  .  benazepril (LOTENSIN) 20 MG tablet, Take 20 mg by mouth daily. , Disp: , Rfl:  .  buPROPion (WELLBUTRIN SR) 150 MG 12 hr tablet, Take 150 mg by mouth daily.  , Disp: , Rfl:  .  Cholecalciferol (VITAMIN D3) 2000 UNITS TABS, Take 2,000 Units by mouth., Disp: , Rfl:  .  citalopram (CELEXA) 20  MG tablet, Take 20 mg by mouth daily.  , Disp: , Rfl:  .  Flaxseed, Linseed, (FLAX SEED OIL PO), Take 1,000 mg by mouth 2 (two) times daily.  , Disp: , Rfl:  .  furosemide (LASIX) 20 MG tablet, Take 20 mg by mouth as needed.  , Disp: , Rfl:  .  gabapentin (NEURONTIN) 300 MG capsule, Take 1 capsule by mouth 3 (three) times daily., Disp: , Rfl:  .  hydrochlorothiazide 25 MG tablet, Take 25 mg by mouth daily.  , Disp: , Rfl:  .  hydroxychloroquine (PLAQUENIL) 200 MG tablet, Take 200 mg by mouth daily. , Disp: , Rfl:  .  meloxicam (MOBIC) 15 MG tablet, Take 15 mg by mouth daily.  , Disp: , Rfl:  .  methotrexate (RHEUMATREX) 5 MG tablet, Take 8 tablets every Friday.    Caution: Chemotherapy. Protect from light., Disp: , Rfl:  .  mometasone (ASMANEX) 220 MCG/INH inhaler, Inhale 2 puffs into the lungs daily.  , Disp: , Rfl:  .  Multiple Vitamin (MULTIVITAMIN) tablet, Take 1 tablet by mouth daily.  , Disp: , Rfl:  .  nebivolol (BYSTOLIC) 10 MG tablet, Take 10 mg by mouth daily.  , Disp: , Rfl:  .  omeprazole (PRILOSEC) 20 MG capsule, Take 1 capsule  by mouth daily., Disp: , Rfl:  .  POTASSIUM PO, Take by mouth 2 (two) times daily. , Disp: , Rfl:  .  VITAMIN E PO, Take 200 mg by mouth daily. , Disp: , Rfl:   Allergies: Not on File  Past Medical History, Surgical history, Social history, and Family History were reviewed and updated.  Review of Systems: As above  Physical Exam:  height is 5\' 5"  (1.651 m) and weight is 258 lb (117.028 kg). Her oral temperature is 98.6 F (37 C). Her blood pressure is 153/72 and her pulse is 59. Her respiration is 18.   Wt Readings from Last 3 Encounters:  12/24/14 258 lb (117.028 kg)  07/28/14 260 lb 3.2 oz (118.026 kg)  12/30/13 260 lb (117.935 kg)     Obese white female in no obvious distress. Head and neck exam shows no ocular or oral lesions. There are no palpable cervical or supraclavicular lymph nodes. Lungs are clear. No rales, wheezes or rhonchi are  noted. Cardiac exam regular rate and rhythm with no murmurs, rubs or bruits. Breast exam shows left breast with no masses, edema or erythema. There is no left axillary adenopathy. Right breast shows the lumpectomy at the 75 position. She has radiation changes on the right breast. No distinct masses noted on the right breast. She has no right axillary adenopathy. Abdomen is obese but soft. She has good bowel sounds. There is no fluid wave. There is no palpable liver or spleen tip. Back exam shows no tenderness over the spine, ribs or hips. Extremities shows no clubbing, cyanosis or edema. Neurological exam shows no focal neurological deficits.  Lab Results  Component Value Date   WBC 6.4 12/24/2014   HGB 13.2 12/24/2014   HCT 40.1 12/24/2014   MCV 95 12/24/2014   PLT 176 12/24/2014     Chemistry      Component Value Date/Time   NA 140 12/25/2013 1054   K 4.3 12/25/2013 1054   CL 101 12/25/2013 1054   CO2 29 12/25/2013 1054   BUN 17 12/25/2013 1054   CREATININE 0.61 12/25/2013 1054      Component Value Date/Time   CALCIUM 9.6 12/25/2013 1054   ALKPHOS 42 12/25/2013 1054   AST 22 12/25/2013 1054   ALT 27 12/25/2013 1054   BILITOT 0.5 12/25/2013 1054         Impression and Plan: [name] is 69 year old white female with a past history of infiltrating ductal carcinoma the right breast. She has stage I disease. She now is out from surgery and treatment by 17 years.  She still estimates he is yearly. I have to believe that her cancer is cured.  Again we will see her in one year. A     Volanda Napoleon, MD 6/1/20161:01 PM

## 2015-01-16 ENCOUNTER — Encounter: Payer: PPO | Admitting: Podiatry

## 2015-01-28 ENCOUNTER — Ambulatory Visit: Payer: Self-pay

## 2015-01-28 ENCOUNTER — Encounter: Payer: Self-pay | Admitting: Podiatry

## 2015-01-28 ENCOUNTER — Ambulatory Visit (INDEPENDENT_AMBULATORY_CARE_PROVIDER_SITE_OTHER): Payer: PPO | Admitting: Podiatry

## 2015-01-28 VITALS — BP 123/70 | HR 61 | Resp 15

## 2015-01-28 DIAGNOSIS — M79672 Pain in left foot: Secondary | ICD-10-CM | POA: Diagnosis not present

## 2015-01-28 DIAGNOSIS — Q667 Congenital pes cavus: Secondary | ICD-10-CM | POA: Diagnosis not present

## 2015-01-28 DIAGNOSIS — M779 Enthesopathy, unspecified: Secondary | ICD-10-CM

## 2015-01-28 DIAGNOSIS — L84 Corns and callosities: Secondary | ICD-10-CM | POA: Diagnosis not present

## 2015-01-28 DIAGNOSIS — M216X9 Other acquired deformities of unspecified foot: Secondary | ICD-10-CM

## 2015-01-28 MED ORDER — TRIAMCINOLONE ACETONIDE 10 MG/ML IJ SUSP
10.0000 mg | Freq: Once | INTRAMUSCULAR | Status: AC
  Administered 2015-01-28: 10 mg

## 2015-01-28 NOTE — Progress Notes (Signed)
   Subjective:    Patient ID: [name], female    DOB: 1946/05/08, 69 y.o.   MRN: [id number]  HPI Patient presents with left foot pain. Looks like a callous on bottom of pinky toe. Left foot is swollen. This has been going on for the past 4 months. Pt did use healing salve on callous. Did help relieve pain. Pt has RA and on the bottom of both feet, pt stated it hurts when they walk. They also stated they feel a burning sensation when walking. This has been going on for the past couple of years.   Review of Systems  HENT: Positive for hearing loss.   Respiratory: Positive for shortness of breath.   Cardiovascular: Positive for leg swelling.  Gastrointestinal: Positive for diarrhea.  Musculoskeletal: Positive for back pain, arthralgias and gait problem.  All other systems reviewed and are negative.      Objective:   Physical Exam        Assessment & Plan:

## 2015-01-29 ENCOUNTER — Ambulatory Visit: Payer: PPO | Admitting: Podiatry

## 2015-01-29 NOTE — Progress Notes (Signed)
Subjective:     Patient ID: [name], female   DOB: October 11, 1945, 69 y.o.   MRN: [id number]  HPI patient presents stating I have this inflamed lesion on my left foot that's been really tender and making it hard for me to walk. I do not remember specific injury but it is becoming worse and it's been present for about 6 months   Review of Systems  All other systems reviewed and are negative.      Objective:   Physical Exam  Constitutional: She is oriented to person, place, and time.  Cardiovascular: Intact distal pulses.   Musculoskeletal: Normal range of motion.  Neurological: She is oriented to person, place, and time.  Skin: Skin is warm.  Nursing note and vitals reviewed.  neurovascular status found to be intact muscle strength within normal limits and patient is noted to have a keratotic lesion sub-fifth metatarsal left with some diminishment of the fat pad noted upon palpation. Patient has a lucent keratotic lesion and fluid buildup underneath this lesion and is also noted to have good digital perfusion and is well oriented 3     Assessment:     Inflammatory lesion had a fifth metatarsal with fluid buildup and sub-capsular inflammation along with probable porokeratosis    Plan:     H&P and condition reviewed with patient. Today I went ahead and injected the plantar capsule 3 mg dexamethasone Kenalog 5 mg Xylocaine and went ahead and debrided the lesion fully and instructed on reduced activity and reappoint as needed May requiring more aggressive procedure

## 2015-03-25 NOTE — Progress Notes (Signed)
This encounter was created in error - please disregard.

## 2015-04-27 ENCOUNTER — Other Ambulatory Visit: Payer: Self-pay

## 2015-04-27 DIAGNOSIS — Z1231 Encounter for screening mammogram for malignant neoplasm of breast: Secondary | ICD-10-CM

## 2015-04-29 ENCOUNTER — Ambulatory Visit
Admission: RE | Admit: 2015-04-29 | Discharge: 2015-04-29 | Disposition: A | Discharge status: Home / Self Care | Payer: PPO | Source: Ambulatory Visit

## 2015-04-29 DIAGNOSIS — Z1231 Encounter for screening mammogram for malignant neoplasm of breast: Secondary | ICD-10-CM

## 2015-05-26 ENCOUNTER — Telehealth: Payer: Self-pay | Admitting: Cardiology

## 2015-05-26 NOTE — Telephone Encounter (Signed)
Pt's daughter called in stating that they just left the Orthopedic doctor and he wanted her to have an MRI done but he wanted the pt to check with her Cardiologist to make sure the doctor approved. Please f/u with her.   Thanks

## 2015-05-26 NOTE — Telephone Encounter (Signed)
Returned call to daughter Lattie Haw.She stated mother needs a MRI of back in the next 1 to 2 weeks.Stated she was told to check with Dr.Jordan to make sure it is ok.Advised Dr.Jordan out of office.Will send message to him for advice.

## 2015-06-01 NOTE — Telephone Encounter (Signed)
No problems getting an MRI from my standpoint.  Peter Martinique MD, [facility]

## 2015-06-01 NOTE — Telephone Encounter (Signed)
Returned call to patient's daughter Lattie Haw no answer.[place].

## 2015-06-02 NOTE — Telephone Encounter (Signed)
Received call from patient's daughter Lattie Haw.Dr.Jordan advised ok to have MRI of back.

## 2015-07-10 ENCOUNTER — Other Ambulatory Visit: Payer: Self-pay | Admitting: Specialist

## 2015-07-10 DIAGNOSIS — M545 Low back pain: Secondary | ICD-10-CM

## 2015-07-22 ENCOUNTER — Ambulatory Visit
Admission: RE | Admit: 2015-07-22 | Discharge: 2015-07-22 | Disposition: A | Discharge status: Home / Self Care | Payer: PPO | Source: Ambulatory Visit | Attending: Specialist | Admitting: Specialist

## 2015-07-22 DIAGNOSIS — M545 Low back pain: Secondary | ICD-10-CM

## 2015-08-06 ENCOUNTER — Other Ambulatory Visit: Payer: Self-pay | Admitting: Orthopaedic Surgery

## 2015-08-06 DIAGNOSIS — M06332 Rheumatoid nodule, left wrist: Secondary | ICD-10-CM | POA: Diagnosis not present

## 2015-08-06 DIAGNOSIS — M06322 Rheumatoid nodule, left elbow: Secondary | ICD-10-CM | POA: Diagnosis not present

## 2015-08-06 DIAGNOSIS — M0579 Rheumatoid arthritis with rheumatoid factor of multiple sites without organ or systems involvement: Secondary | ICD-10-CM | POA: Diagnosis not present

## 2015-08-06 DIAGNOSIS — M79642 Pain in left hand: Secondary | ICD-10-CM | POA: Diagnosis not present

## 2015-08-06 DIAGNOSIS — M06342 Rheumatoid nodule, left hand: Secondary | ICD-10-CM | POA: Diagnosis not present

## 2015-08-07 DIAGNOSIS — Z79899 Other long term (current) drug therapy: Secondary | ICD-10-CM | POA: Diagnosis not present

## 2015-08-07 DIAGNOSIS — M4806 Spinal stenosis, lumbar region: Secondary | ICD-10-CM | POA: Diagnosis not present

## 2015-08-11 ENCOUNTER — Telehealth: Payer: Self-pay

## 2015-08-11 ENCOUNTER — Ambulatory Visit (INDEPENDENT_AMBULATORY_CARE_PROVIDER_SITE_OTHER): Payer: PPO | Admitting: Cardiology

## 2015-08-11 ENCOUNTER — Encounter: Payer: Self-pay | Admitting: Cardiology

## 2015-08-11 VITALS — BP 120/86 | HR 66 | Ht 64.5 in | Wt 251.6 lb

## 2015-08-11 DIAGNOSIS — R0600 Dyspnea, unspecified: Secondary | ICD-10-CM | POA: Diagnosis not present

## 2015-08-11 DIAGNOSIS — I10 Essential (primary) hypertension: Secondary | ICD-10-CM

## 2015-08-11 DIAGNOSIS — E785 Hyperlipidemia, unspecified: Secondary | ICD-10-CM

## 2015-08-11 DIAGNOSIS — I2510 Atherosclerotic heart disease of native coronary artery without angina pectoris: Secondary | ICD-10-CM | POA: Diagnosis not present

## 2015-08-11 NOTE — Telephone Encounter (Signed)
Received surgical clearance from [employer].Dr.Jordan cleared patient for surgery.Form faxed back to fax # [phone].

## 2015-08-11 NOTE — Progress Notes (Signed)
[name] Date of Birth: 11-03-1945   History of Present Illness: [name] is seen for preoperative clearance for lumbar laminectoy.  She is s/p CABG in 2005. Myoview in March 2015 was normal. When seen at that time she complained of dyspnea. Echo was unremarkable. She was seen by Dr. Elsworth Soho and diagnosed with OSA by at home testing. She is currently using nocturnal oxygen.  [facility] and noted improvement in breathing and 9 lb weight loss. She then developed severe back pain and has not been able to exercise. No chest pain.  Current Outpatient Prescriptions on File Prior to Visit  Medication Sig Dispense Refill  . albuterol (PROVENTIL HFA;VENTOLIN HFA) 108 (90 BASE) MCG/ACT inhaler Inhale 2 puffs into the lungs every 4 (four) hours as needed for wheezing or shortness of breath (cough, shortness of breath or wheezing.). 1 Inhaler 12  . ALFALFA PO Take by mouth 2 (two) times daily.      Marland Kitchen amLODipine (NORVASC) 5 MG tablet Take 5 mg by mouth Daily.     Marland Kitchen amLODipine-benazepril (LOTREL) 5-20 MG per capsule Take 1 capsule by mouth daily.     Marland Kitchen aspirin 81 MG tablet Take 81 mg by mouth daily.      Marland Kitchen b complex vitamins tablet Take 1 tablet by mouth 2 (two) times daily.      . benazepril (LOTENSIN) 20 MG tablet Take 20 mg by mouth daily.     Marland Kitchen buPROPion (WELLBUTRIN SR) 150 MG 12 hr tablet Take 150 mg by mouth daily.      . Cholecalciferol (VITAMIN D3) 2000 UNITS TABS Take 2,000 Units by mouth.    . Flaxseed, Linseed, (FLAX SEED OIL PO) Take 1,000 mg by mouth 2 (two) times daily.      . furosemide (LASIX) 20 MG tablet Take 20 mg by mouth as needed.      . gabapentin (NEURONTIN) 300 MG capsule Take 1 capsule by mouth 3 (three) times daily.    . hydrochlorothiazide 25 MG tablet Take 25 mg by mouth daily.      . hydroxychloroquine (PLAQUENIL) 200 MG tablet Take 200 mg by mouth daily.     . meloxicam (MOBIC) 15 MG tablet Take 15 mg by mouth  daily.      . methotrexate (50 MG/ML) 1 G injection Inject 0.8 mg into the vein every 7 (seven) days.    . mometasone (ASMANEX) 220 MCG/INH inhaler Inhale 2 puffs into the lungs daily.      . Multiple Vitamin (MULTIVITAMIN) tablet Take 1 tablet by mouth daily.      . nebivolol (BYSTOLIC) 10 MG tablet Take 10 mg by mouth daily.      Marland Kitchen omeprazole (PRILOSEC) 20 MG capsule Take 1 capsule by mouth daily.    Marland Kitchen POTASSIUM PO Take by mouth 2 (two) times daily.     Marland Kitchen VITAMIN E PO Take 200 mg by mouth daily.     . citalopram (CELEXA) 20 MG tablet Take 20 mg by mouth daily. Reported on 08/11/2015     No current facility-administered medications on file prior to visit.    No Known Allergies  Past Medical History  Diagnosis Date  . Cancer (HCC)     breast  . Dyspnea   . Asthma   . Coronary artery disease   . Diabetes mellitus   . Hypertension   . Hyperlipidemia   . Obesity   . Breast  cancer ([place])   . OA (osteoarthritis) of knee   . Depression   . Renal calculi   . GERD (gastroesophageal reflux disease)   . Diabetes mellitus type II   . Cataract   . Asthma   . OSA (obstructive sleep apnea)     Past Surgical History  Procedure Laterality Date  . Triple bypass  04/27/05  . Cardiac catheterization  04/23/2004    EF 60%  . Coronary artery bypass graft  2005    LIMA GRAFT TO LAD, SAPHENOUS VEIN GRAFT TO THE FIRST DIAGONAL, AND LEFT RADIAL ARTERY GRAFT TO THE OM  . Breast lumpectomy      RIGHT BREAST  . Tubal ligation    . Wrist surgery    . US echocardiography  01/06/2009    EF 55-60%  . Cardiovascular stress test  07/12/2010    EF 69%  . Total knee arthroplasty      left  . Joint replacement      History  Smoking status  . Never Smoker   Smokeless tobacco  . Not on file    History  Alcohol Use No    Family History  Problem Relation Age of Onset  . Heart disease Mother   . Cancer Father   . Cancer Brother   . Heart disease Brother     Review of Systems: As noted  history of present illness.  All other systems were reviewed and are negative.  Physical Exam: BP 120/86 mmHg  Pulse 66  Ht 5' 4.5" (1.638 m)  Wt 114.108 kg (251 lb 9 oz)  BMI 42.53 kg/m2 She is a pleasant obese white female in no acute distress. Her HEENT exam is unremarkable. She is normocephalic, atraumatic. Pupils are equal round and reactive to light and accommodation. Extraocular movements are full. Oropharynx is clear. Neck is supple without JVD, adenopathy, thyromegaly, or bruits. Lungs are clear. Cardiac exam reveals a regular rate and rhythm without gallop, murmur, or click. Abdomen is obese, soft, nontender. She has trace lower extremity edema. She has arthritic changes in her knees. She is alert and oriented x3. Cranial nerves II through XII are intact.  LABORATORY DATA: ECG today shows normal sinus rhythm.  Rate 67. ST- T changes consistent to anterior ischemia. Unchanged from before.I have personally reviewed and interpreted this study.  Echo: 2/16/15Study Conclusions  - Left ventricle: The cavity size was normal. Wall thickness was normal. Systolic function was normal. The estimated ejection fraction was in the range of 50% to 55%. - Mitral valve: Nodular calcification of anterior leaflet tip Mild regurgitation. - Left atrium: The atrium was moderately dilated. - Atrial septum: No defect or patent foramen ovale was identified. - Pericardium, extracardiac: A trivial pericardial effusion was identified posterior to the heart. - Impressions: Elevated E/E' ratio suggested diastolic dysfunction with elevated EDP Impressions:  - Elevated E/E' ratio suggested diastolic dysfunction with elevated EDP  Cardiology Nuclear Med Study  [name] is a 70 y.o. female MRN : [id number] DOB: Dec 14, 1945  Procedure Date: 09/24/2013  Nuclear Med Background Indication for Stress Test: Evaluation for Ischemia and Graft Patency History: CABG, '10 Echo: EF=55-60%,  and 2011 Myocardial Perfusion Imaging-Normal, EF=69% Cardiac Risk Factors: Family History - CAD, Hypertension, Lipids and NIDDM  Symptoms: Chest Pain with/without exertion (last occurrence 3-4 weeks ago), Dizziness, DOE, Palpitations and SOB   Nuclear Pre-Procedure Caffeine/Decaff Intake: None > 12 hrs NPO After: 7:00am   Lungs: clear O2 Sat: 96% on room air. IV 0.9%  NS with Angio Cath: 22g  IV Site: L Forearm x 1, tolerated well IV Started by: Irven Baltimore, RN  Chest Size (in): 42 Cup Size: D  Height: 5\' 5"  (1.651 m)  Weight: 258 lb (117.028 kg)  BMI: Body mass index is 42.93 kg/(m^2). 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    Nuclear Med Study 1 or 2 day study: 2 day  Stress Test Type: Lexiscan  Reading MD: N/A  Order Authorizing Provider: Zae Kirtz Martinique, MD  Resting Radionuclide: Technetium 31m Sestamibi  Resting Radionuclide Dose: 33.0 mCi ON  09-30-13  Stress Radionuclide: Technetium 29m Sestamibi  Stress Radionuclide Dose: 33.0 mCi ON  09-24-13     Stress Protocol Rest HR: 58 Stress HR: 74  Rest BP: 98/60 Stress BP: 117/52  Exercise Time (min): n/a METS: n/a   Predicted Max HR: 152 bpm % Max HR: 48.68 bpm Rate Pressure Product: 8658   Dose of Adenosine (mg): n/a Dose of Lexiscan: 0.4 mg  Dose of Atropine (mg): n/a Dose of Dobutamine: n/a mcg/kg/min (at max HR)  Stress Test Technologist: Irven Baltimore, RN  Nuclear Technologist: Charlton Amor, CNMT     Rest Procedure: Myocardial perfusion imaging was performed at rest 45 minutes following the intravenous administration of Technetium 79m Sestamibi. Rest ECG: NSR with non-specific ST-T wave changes  Stress Procedure: The patient received IV Lexiscan 0.4 mg over 15-seconds. Technetium 5m Sestamibi injected at 30-seconds. The patient complained of SOB, Head feeling funny, and Chest Pain with Lexiscan. Quantitative spect images were obtained after a 45 minute  delay. Stress ECG: No significant change from baseline ECG  QPS Raw Data Images: Soft tissue (diaphragm, breast, bowel activity. ) surround heart.  Stress Images: Apical thinning and very mild thinning in the inferolateral bse. Otherwide normap perfusion.  Rest Images: No significant changes from the stress images.  Subtraction (SDS): No evidence of ischemia. Transient Ischemic Dilatation (Normal <1.22): 0.99 Lung/Heart Ratio (Normal <0.45): 0.33  Quantitative Gated Spect Images QGS EDV: 127 ml QGS ESV: 50 ml  Impression Exercise Capacity: Lexiscan with no exercise. BP Response: Normal blood pressure response. Clinical Symptoms: Mild chest pain/dyspnea. ECG Impression: No significant ST segment change suggestive of ischemia. Comparison with Prior Nuclear Study: Minimal change in the inferolateral base.   Overall Impression: Probable normal perfusion and soft tissue attenuation No significant ischemia or scar.   LV Ejection Fraction: 61%. LV Wall Motion: NL LV Function; NL Wall Motion     Assessment / Plan: 1. Coronary disease.  She is status post CABG in 2005. Nuclear stress test in March 2015 was normal. No chest pain. Continue medical therapy. She is cleared for lumbar laminectomy from a cardiac standpoint.   2. Hypertension, controlled.  3. Hyperlipidemia.  4. Diabetes mellitus type 2.  5. Morbid obesity with OSA, 9 lb weight loss.  6. OSA   I will follow up in one year.

## 2015-08-11 NOTE — Addendum Note (Signed)
Addended by: Golden Hurter D on: 08/11/2015 05:20 PM   Modules accepted: Orders

## 2015-08-11 NOTE — Patient Instructions (Signed)
You are cleared for back surgery.  I will see you in one year.

## 2015-08-12 DIAGNOSIS — J4520 Mild intermittent asthma, uncomplicated: Secondary | ICD-10-CM | POA: Diagnosis not present

## 2015-08-19 DIAGNOSIS — E119 Type 2 diabetes mellitus without complications: Secondary | ICD-10-CM | POA: Diagnosis not present

## 2015-08-19 DIAGNOSIS — H524 Presbyopia: Secondary | ICD-10-CM | POA: Diagnosis not present

## 2015-08-19 DIAGNOSIS — Z79899 Other long term (current) drug therapy: Secondary | ICD-10-CM | POA: Diagnosis not present

## 2015-08-28 ENCOUNTER — Other Ambulatory Visit (HOSPITAL_COMMUNITY): Payer: Self-pay | Admitting: Specialist

## 2015-09-02 NOTE — Pre-Procedure Instructions (Signed)
[name]  09/02/2015      [id number] - [place], [place] [address] [place] [address] Phone: [phone] Fax: [phone]    Your procedure is scheduled on Tues, Feb 14 @ 12:30 PM  Report to [facility] Admitting at 10:30 AM  Call this number if you have problems the morning of surgery:  [phone]   Remember:  Do not eat food or drink liquids after midnight.  Take these medicines the morning of surgery with A SIP OF WATER Albuterol<Bring Your Inhaler With You>,Amlodipine(Norvasc),Pulmicort(Budesonide)--neb,Wellbutrin(Bupropion),Cymbalta(Duloxetine),Gabapentin(Neurontin),Claritin(Loratadine),Nebivolol(Bystolic),Omeprazole(Prilosec)             Stop taking your Vit E and Aspirin. No Goody's,BC',Aleve,Ibuprofen,Motrin,Advil,Fish Oil,or any Herbal Medications.    Do not wear jewelry, make-up or nail polish.  Do not wear lotions, powders, or perfumes.  You may wear deodorant.   Do not shave 48 hours prior to surgery.    Do not bring valuables to the hospital.  [facility] is not responsible for any belongings or valuables.  Contacts, dentures or bridgework may not be worn into surgery.  Leave your suitcase in the car.  After surgery it may be brought to your room.  For patients admitted to the hospital, discharge time will be determined by your treatment team.  Patients discharged the day of surgery will not be allowed to drive home.   Special instructions:  [place] - Preparing for Surgery  Before surgery, you can play an important role.  Because skin is not sterile, your skin needs to be as free of germs as possible.  You can reduce the number of germs on you skin by washing with CHG (chlorahexidine gluconate) soap before surgery.  CHG is an antiseptic cleaner which kills germs and bonds with the skin to continue killing germs even after washing.  Please DO NOT use if you have an allergy to CHG or antibacterial soaps.   If your skin becomes reddened/irritated stop using the CHG and inform your nurse when you arrive at Short Stay.  Do not shave (including legs and underarms) for at least 48 hours prior to the first CHG shower.  You may shave your face.  Please follow these instructions carefully:   1.  Shower with CHG Soap the night before surgery and the                                morning of Surgery.  2.  If you choose to wash your hair, wash your hair first as usual with your       normal shampoo.  3.  After you shampoo, rinse your hair and body thoroughly to remove the                      Shampoo.  4.  Use CHG as you would any other liquid soap.  You can apply chg directly       to the skin and wash gently with scrungie or a clean washcloth.  5.  Apply the CHG Soap to your body ONLY FROM THE NECK DOWN.        Do not use on open wounds or open sores.  Avoid contact with your eyes,       ears, mouth and genitals (private parts).  Wash genitals (private parts)       with your normal soap.  6.  Wash thoroughly,  paying special attention to the area where your surgery        will be performed.  7.  Thoroughly rinse your body with warm water from the neck down.  8.  DO NOT shower/wash with your normal soap after using and rinsing off       the CHG Soap.  9.  Pat yourself dry with a clean towel.            10.  Wear clean pajamas.            11.  Place clean sheets on your bed the night of your first shower and do not        sleep with pets.  Day of Surgery  Do not apply any lotions/deoderants the morning of surgery.  Please wear clean clothes to the hospital/surgery center.    Please read over the following fact sheets that you were given. Pain Booklet, Coughing and Deep Breathing, Blood Transfusion Information, MRSA Information and Surgical Site Infection Prevention

## 2015-09-03 ENCOUNTER — Encounter (HOSPITAL_COMMUNITY): Payer: Self-pay

## 2015-09-03 ENCOUNTER — Encounter (HOSPITAL_COMMUNITY)
Admission: RE | Admit: 2015-09-03 | Discharge: 2015-09-03 | Disposition: A | Discharge status: Home / Self Care | Payer: PPO | Source: Ambulatory Visit | Attending: Specialist | Admitting: Specialist

## 2015-09-03 DIAGNOSIS — Z01812 Encounter for preprocedural laboratory examination: Secondary | ICD-10-CM | POA: Insufficient documentation

## 2015-09-03 DIAGNOSIS — M4806 Spinal stenosis, lumbar region: Secondary | ICD-10-CM | POA: Insufficient documentation

## 2015-09-03 DIAGNOSIS — Z0183 Encounter for blood typing: Secondary | ICD-10-CM | POA: Diagnosis not present

## 2015-09-03 HISTORY — DX: Frequency of micturition: R35.0

## 2015-09-03 HISTORY — DX: Nocturia: R35.1

## 2015-09-03 HISTORY — DX: Edema, unspecified: R60.9

## 2015-09-03 HISTORY — DX: Effusion, unspecified joint: M25.40

## 2015-09-03 HISTORY — DX: Rheumatoid arthritis, unspecified: M06.9

## 2015-09-03 HISTORY — DX: Headache: R51

## 2015-09-03 HISTORY — DX: Localized edema: R60.0

## 2015-09-03 HISTORY — DX: Dorsalgia, unspecified: M54.9

## 2015-09-03 HISTORY — DX: Personal history of urinary calculi: Z87.442

## 2015-09-03 HISTORY — DX: Irritable bowel syndrome, unspecified: K58.9

## 2015-09-03 HISTORY — DX: Other chronic pain: G89.29

## 2015-09-03 HISTORY — DX: Pain in unspecified joint: M25.50

## 2015-09-03 HISTORY — DX: Weakness: R53.1

## 2015-09-03 HISTORY — DX: Pneumonia, unspecified organism: J18.9

## 2015-09-03 HISTORY — DX: Headache, unspecified: R51.9

## 2015-09-03 HISTORY — DX: Polyneuropathy, unspecified: G62.9

## 2015-09-03 HISTORY — DX: Urgency of urination: R39.15

## 2015-09-03 LAB — COMPREHENSIVE METABOLIC PANEL
ALT: 25 U/L (ref 14–54)
ANION GAP: 11 (ref 5–15)
AST: 27 U/L (ref 15–41)
Albumin: 4 g/dL (ref 3.5–5.0)
Alkaline Phosphatase: 41 U/L (ref 38–126)
BILIRUBIN TOTAL: 0.6 mg/dL (ref 0.3–1.2)
BUN: 16 mg/dL (ref 6–20)
CHLORIDE: 103 mmol/L (ref 101–111)
CO2: 27 mmol/L (ref 22–32)
Calcium: 9.1 mg/dL (ref 8.9–10.3)
Creatinine, Ser: 0.57 mg/dL (ref 0.44–1.00)
Glucose, Bld: 129 mg/dL — ABNORMAL HIGH (ref 65–99)
POTASSIUM: 4 mmol/L (ref 3.5–5.1)
Sodium: 141 mmol/L (ref 135–145)
TOTAL PROTEIN: 6.7 g/dL (ref 6.5–8.1)

## 2015-09-03 LAB — CBC
HEMATOCRIT: 40 % (ref 36.0–46.0)
Hemoglobin: 13 g/dL (ref 12.0–15.0)
MCH: 30.5 pg (ref 26.0–34.0)
MCHC: 32.5 g/dL (ref 30.0–36.0)
MCV: 93.9 fL (ref 78.0–100.0)
PLATELETS: 174 10*3/uL (ref 150–400)
RBC: 4.26 MIL/uL (ref 3.87–5.11)
RDW: 14.6 % (ref 11.5–15.5)
WBC: 6.4 10*3/uL (ref 4.0–10.5)

## 2015-09-03 LAB — SURGICAL PCR SCREEN
MRSA, PCR: NEGATIVE
Staphylococcus aureus: NEGATIVE

## 2015-09-03 LAB — TYPE AND SCREEN
ABO/RH(D): A POS
ANTIBODY SCREEN: NEGATIVE

## 2015-09-03 LAB — ABO/RH: ABO/RH(D): A POS

## 2015-09-03 MED ORDER — CHLORHEXIDINE GLUCONATE 4 % EX LIQD: 60.0000 mL | Freq: Once | CUTANEOUS | Status: DC

## 2015-09-03 NOTE — Progress Notes (Addendum)
Cardiologist is Dr.Jordan-clearance note in epic from 08-11-15  Medical Md is Dr.Mark Joylene Draft  Echo report in epic from 2015  Stress test reports in epic from 2008/2011/2015  Sleep study in epic from 11-27-13,uses Oxygen not CPAP  EKG in epic from 08-11-15  CXR denies in past yr  Heart cath done in 2005

## 2015-09-04 LAB — HEMOGLOBIN A1C
Hgb A1c MFr Bld: 6.2 % — ABNORMAL HIGH (ref 4.8–5.6)
MEAN PLASMA GLUCOSE: 131 mg/dL

## 2015-09-07 MED ORDER — CEFAZOLIN SODIUM-DEXTROSE 2-3 GM-% IV SOLR
2.0000 g | INTRAVENOUS | Status: AC
  Administered 2015-09-08: 2 g via INTRAVENOUS
  Filled 2015-09-07: qty 50

## 2015-09-08 ENCOUNTER — Inpatient Hospital Stay (HOSPITAL_COMMUNITY)
Admission: RE | Admit: 2015-09-08 | Discharge: 2015-09-15 | DRG: 516 | Disposition: A | Discharge status: Home / Self Care | Payer: PPO | Source: Ambulatory Visit | Attending: Specialist | Admitting: Specialist

## 2015-09-08 ENCOUNTER — Encounter (HOSPITAL_COMMUNITY): Payer: Self-pay | Admitting: Anesthesiology

## 2015-09-08 ENCOUNTER — Inpatient Hospital Stay (HOSPITAL_COMMUNITY): Payer: PPO | Admitting: Certified Registered"

## 2015-09-08 ENCOUNTER — Encounter (HOSPITAL_COMMUNITY)
Admission: RE | Disposition: A | Discharge status: Home / Self Care | Payer: Self-pay | Source: Ambulatory Visit | Attending: Specialist

## 2015-09-08 ENCOUNTER — Inpatient Hospital Stay (HOSPITAL_COMMUNITY): Payer: PPO

## 2015-09-08 DIAGNOSIS — G9611 Dural tear: Secondary | ICD-10-CM | POA: Diagnosis not present

## 2015-09-08 DIAGNOSIS — M5440 Lumbago with sciatica, unspecified side: Secondary | ICD-10-CM

## 2015-09-08 DIAGNOSIS — Z951 Presence of aortocoronary bypass graft: Secondary | ICD-10-CM | POA: Diagnosis not present

## 2015-09-08 DIAGNOSIS — M4806 Spinal stenosis, lumbar region: Secondary | ICD-10-CM | POA: Diagnosis not present

## 2015-09-08 DIAGNOSIS — Z7951 Long term (current) use of inhaled steroids: Secondary | ICD-10-CM

## 2015-09-08 DIAGNOSIS — Z419 Encounter for procedure for purposes other than remedying health state, unspecified: Secondary | ICD-10-CM

## 2015-09-08 DIAGNOSIS — Z96653 Presence of artificial knee joint, bilateral: Secondary | ICD-10-CM | POA: Diagnosis not present

## 2015-09-08 DIAGNOSIS — Z6841 Body Mass Index (BMI) 40.0 and over, adult: Secondary | ICD-10-CM

## 2015-09-08 DIAGNOSIS — M4606 Spinal enthesopathy, lumbar region: Secondary | ICD-10-CM | POA: Diagnosis not present

## 2015-09-08 DIAGNOSIS — M2578 Osteophyte, vertebrae: Secondary | ICD-10-CM | POA: Diagnosis not present

## 2015-09-08 DIAGNOSIS — I10 Essential (primary) hypertension: Secondary | ICD-10-CM | POA: Diagnosis not present

## 2015-09-08 DIAGNOSIS — E1142 Type 2 diabetes mellitus with diabetic polyneuropathy: Secondary | ICD-10-CM | POA: Diagnosis present

## 2015-09-08 DIAGNOSIS — Z79899 Other long term (current) drug therapy: Secondary | ICD-10-CM

## 2015-09-08 DIAGNOSIS — G4733 Obstructive sleep apnea (adult) (pediatric): Secondary | ICD-10-CM | POA: Diagnosis present

## 2015-09-08 DIAGNOSIS — J45909 Unspecified asthma, uncomplicated: Secondary | ICD-10-CM | POA: Diagnosis not present

## 2015-09-08 DIAGNOSIS — M545 Low back pain: Secondary | ICD-10-CM | POA: Diagnosis not present

## 2015-09-08 DIAGNOSIS — K219 Gastro-esophageal reflux disease without esophagitis: Secondary | ICD-10-CM | POA: Diagnosis present

## 2015-09-08 DIAGNOSIS — G960 Cerebrospinal fluid leak: Secondary | ICD-10-CM | POA: Diagnosis not present

## 2015-09-08 DIAGNOSIS — F329 Major depressive disorder, single episode, unspecified: Secondary | ICD-10-CM | POA: Diagnosis not present

## 2015-09-08 DIAGNOSIS — M069 Rheumatoid arthritis, unspecified: Secondary | ICD-10-CM | POA: Diagnosis present

## 2015-09-08 DIAGNOSIS — I2510 Atherosclerotic heart disease of native coronary artery without angina pectoris: Secondary | ICD-10-CM | POA: Diagnosis present

## 2015-09-08 DIAGNOSIS — E785 Hyperlipidemia, unspecified: Secondary | ICD-10-CM | POA: Diagnosis not present

## 2015-09-08 DIAGNOSIS — M48062 Spinal stenosis, lumbar region with neurogenic claudication: Secondary | ICD-10-CM | POA: Diagnosis present

## 2015-09-08 DIAGNOSIS — Z9889 Other specified postprocedural states: Secondary | ICD-10-CM | POA: Diagnosis not present

## 2015-09-08 DIAGNOSIS — Z888 Allergy status to other drugs, medicaments and biological substances status: Secondary | ICD-10-CM | POA: Diagnosis not present

## 2015-09-08 HISTORY — PX: LUMBAR LAMINECTOMY/DECOMPRESSION MICRODISCECTOMY: SHX5026

## 2015-09-08 LAB — GLUCOSE, CAPILLARY
GLUCOSE-CAPILLARY: 127 mg/dL — AB (ref 65–99)
GLUCOSE-CAPILLARY: 131 mg/dL — AB (ref 65–99)

## 2015-09-08 SURGERY — LUMBAR LAMINECTOMY/DECOMPRESSION MICRODISCECTOMY
Anesthesia: General | Site: Spine Lumbar

## 2015-09-08 MED ORDER — HYDROMORPHONE HCL 1 MG/ML IJ SOLN
0.2500 mg | INTRAMUSCULAR | Status: DC | PRN
  Administered 2015-09-08 (×2): 0.5 mg via INTRAVENOUS

## 2015-09-08 MED ORDER — PROPOFOL 10 MG/ML IV BOLUS
INTRAVENOUS | Status: AC
  Filled 2015-09-08: qty 20

## 2015-09-08 MED ORDER — ASPIRIN 81 MG PO CHEW
81.0000 mg | CHEWABLE_TABLET | Freq: Every day | ORAL | Status: DC
  Administered 2015-09-08 – 2015-09-15 (×8): 81 mg via ORAL
  Filled 2015-09-08 (×8): qty 1

## 2015-09-08 MED ORDER — LIDOCAINE HCL (CARDIAC) 20 MG/ML IV SOLN
INTRAVENOUS | Status: AC
  Filled 2015-09-08: qty 5

## 2015-09-08 MED ORDER — ROCURONIUM BROMIDE 100 MG/10ML IV SOLN
INTRAVENOUS | Status: DC | PRN
  Administered 2015-09-08: 50 mg via INTRAVENOUS
  Administered 2015-09-08: 10 mg via INTRAVENOUS

## 2015-09-08 MED ORDER — FOLIC ACID 1 MG PO TABS
1.0000 mg | ORAL_TABLET | Freq: Every day | ORAL | Status: DC
  Administered 2015-09-08 – 2015-09-15 (×8): 1 mg via ORAL
  Filled 2015-09-08 (×8): qty 1

## 2015-09-08 MED ORDER — BUDESONIDE 0.5 MG/2ML IN SUSP
0.5000 mg | Freq: Two times a day (BID) | RESPIRATORY_TRACT | Status: DC
  Administered 2015-09-10 – 2015-09-15 (×8): 0.5 mg via RESPIRATORY_TRACT
  Filled 2015-09-08 (×18): qty 2

## 2015-09-08 MED ORDER — MELOXICAM 7.5 MG PO TABS
15.0000 mg | ORAL_TABLET | Freq: Every day | ORAL | Status: DC
  Administered 2015-09-08 – 2015-09-15 (×8): 15 mg via ORAL
  Filled 2015-09-08 (×9): qty 2

## 2015-09-08 MED ORDER — FUROSEMIDE 20 MG PO TABS
20.0000 mg | ORAL_TABLET | Freq: Every day | ORAL | Status: DC
  Administered 2015-09-08 – 2015-09-13 (×6): 20 mg via ORAL
  Filled 2015-09-08 (×6): qty 1

## 2015-09-08 MED ORDER — EPHEDRINE SULFATE 50 MG/ML IJ SOLN
INTRAMUSCULAR | Status: DC | PRN
  Administered 2015-09-08: 5 mg via INTRAVENOUS
  Administered 2015-09-08 (×2): 10 mg via INTRAVENOUS
  Administered 2015-09-08 (×3): 5 mg via INTRAVENOUS
  Administered 2015-09-08: 10 mg via INTRAVENOUS

## 2015-09-08 MED ORDER — OXYCODONE-ACETAMINOPHEN 5-325 MG PO TABS
1.0000 | ORAL_TABLET | ORAL | Status: DC | PRN
  Administered 2015-09-08 – 2015-09-12 (×11): 2 via ORAL
  Filled 2015-09-08 (×12): qty 2

## 2015-09-08 MED ORDER — HYDROCODONE-ACETAMINOPHEN 5-325 MG PO TABS
1.0000 | ORAL_TABLET | ORAL | Status: DC | PRN
  Administered 2015-09-09: 2 via ORAL
  Administered 2015-09-10: 1 via ORAL
  Administered 2015-09-11 (×3): 2 via ORAL
  Filled 2015-09-08 (×3): qty 2
  Filled 2015-09-08: qty 1
  Filled 2015-09-08: qty 2

## 2015-09-08 MED ORDER — PANTOPRAZOLE SODIUM 40 MG IV SOLR
40.0000 mg | Freq: Every day | INTRAVENOUS | Status: DC
  Administered 2015-09-08: 40 mg via INTRAVENOUS
  Filled 2015-09-08: qty 40

## 2015-09-08 MED ORDER — MORPHINE SULFATE (PF) 2 MG/ML IV SOLN
1.0000 mg | INTRAVENOUS | Status: DC | PRN
  Administered 2015-09-09 – 2015-09-11 (×7): 2 mg via INTRAVENOUS
  Filled 2015-09-08 (×7): qty 1

## 2015-09-08 MED ORDER — DEXTROSE 5 % IV SOLN
10.0000 mg | INTRAVENOUS | Status: DC | PRN
  Administered 2015-09-08: 15 ug/min via INTRAVENOUS

## 2015-09-08 MED ORDER — ACETAMINOPHEN 325 MG PO TABS
650.0000 mg | ORAL_TABLET | ORAL | Status: DC | PRN
  Administered 2015-09-12 – 2015-09-15 (×3): 650 mg via ORAL
  Filled 2015-09-08 (×3): qty 2

## 2015-09-08 MED ORDER — ARTIFICIAL TEARS OP OINT
TOPICAL_OINTMENT | OPHTHALMIC | Status: DC | PRN
  Administered 2015-09-08: 1 via OPHTHALMIC

## 2015-09-08 MED ORDER — FENTANYL CITRATE (PF) 250 MCG/5ML IJ SOLN
INTRAMUSCULAR | Status: AC
  Filled 2015-09-08: qty 5

## 2015-09-08 MED ORDER — ADULT MULTIVITAMIN W/MINERALS CH
1.0000 | ORAL_TABLET | Freq: Every day | ORAL | Status: DC
  Administered 2015-09-08 – 2015-09-15 (×8): 1 via ORAL
  Filled 2015-09-08 (×10): qty 1

## 2015-09-08 MED ORDER — POLYETHYLENE GLYCOL 3350 17 G PO PACK
17.0000 g | PACK | Freq: Every day | ORAL | Status: DC | PRN
  Administered 2015-09-13 – 2015-09-14 (×2): 17 g via ORAL
  Filled 2015-09-08 (×2): qty 1

## 2015-09-08 MED ORDER — ONDANSETRON HCL 4 MG/2ML IJ SOLN
INTRAMUSCULAR | Status: AC
  Filled 2015-09-08: qty 2

## 2015-09-08 MED ORDER — LORATADINE 10 MG PO TABS
10.0000 mg | ORAL_TABLET | Freq: Every day | ORAL | Status: DC
  Administered 2015-09-08 – 2015-09-15 (×8): 10 mg via ORAL
  Filled 2015-09-08 (×8): qty 1

## 2015-09-08 MED ORDER — VITAMIN D 1000 UNITS PO TABS
2000.0000 [IU] | ORAL_TABLET | Freq: Every morning | ORAL | Status: AC
  Administered 2015-09-09 – 2015-09-15 (×7): 2000 [IU] via ORAL
  Filled 2015-09-08 (×9): qty 2

## 2015-09-08 MED ORDER — SODIUM CHLORIDE 0.9 % IV SOLN: 250.0000 mL | INTRAVENOUS | Status: DC

## 2015-09-08 MED ORDER — HYDROMORPHONE HCL 1 MG/ML IJ SOLN
INTRAMUSCULAR | Status: AC
  Administered 2015-09-08: 0.5 mg via INTRAVENOUS
  Filled 2015-09-08: qty 1

## 2015-09-08 MED ORDER — EPHEDRINE SULFATE 50 MG/ML IJ SOLN
INTRAMUSCULAR | Status: AC
  Filled 2015-09-08: qty 1

## 2015-09-08 MED ORDER — DOCUSATE SODIUM 100 MG PO CAPS
100.0000 mg | ORAL_CAPSULE | Freq: Two times a day (BID) | ORAL | Status: DC
  Administered 2015-09-08 – 2015-09-15 (×14): 100 mg via ORAL
  Filled 2015-09-08 (×15): qty 1

## 2015-09-08 MED ORDER — VITAMIN E 45 MG (100 UNIT) PO CAPS
100.0000 [IU] | ORAL_CAPSULE | Freq: Every day | ORAL | Status: DC
  Administered 2015-09-08 – 2015-09-15 (×8): 100 [IU] via ORAL
  Filled 2015-09-08 (×8): qty 1

## 2015-09-08 MED ORDER — B COMPLEX-C PO TABS
1.0000 | ORAL_TABLET | Freq: Two times a day (BID) | ORAL | Status: DC
  Administered 2015-09-08 – 2015-09-15 (×13): 1 via ORAL
  Filled 2015-09-08 (×14): qty 1

## 2015-09-08 MED ORDER — HEMOSTATIC AGENTS (NO CHARGE) OPTIME
TOPICAL | Status: DC | PRN
  Administered 2015-09-08: 1 via TOPICAL

## 2015-09-08 MED ORDER — BUPROPION HCL ER (SR) 150 MG PO TB12
150.0000 mg | ORAL_TABLET | Freq: Every day | ORAL | Status: DC
  Administered 2015-09-08 – 2015-09-15 (×8): 150 mg via ORAL
  Filled 2015-09-08 (×8): qty 1

## 2015-09-08 MED ORDER — THROMBIN 20000 UNITS EX KIT
PACK | CUTANEOUS | Status: DC | PRN
  Administered 2015-09-08: 20 mL via TOPICAL

## 2015-09-08 MED ORDER — MIDAZOLAM HCL 5 MG/5ML IJ SOLN
INTRAMUSCULAR | Status: DC | PRN
  Administered 2015-09-08: 2 mg via INTRAVENOUS

## 2015-09-08 MED ORDER — ARTIFICIAL TEARS OP OINT
TOPICAL_OINTMENT | OPHTHALMIC | Status: AC
  Filled 2015-09-08: qty 3.5

## 2015-09-08 MED ORDER — METHOCARBAMOL 1000 MG/10ML IJ SOLN
500.0000 mg | Freq: Four times a day (QID) | INTRAVENOUS | Status: DC | PRN
  Filled 2015-09-08: qty 5

## 2015-09-08 MED ORDER — PHENYLEPHRINE 40 MCG/ML (10ML) SYRINGE FOR IV PUSH (FOR BLOOD PRESSURE SUPPORT)
PREFILLED_SYRINGE | INTRAVENOUS | Status: AC
  Filled 2015-09-08: qty 10

## 2015-09-08 MED ORDER — POTASSIUM CHLORIDE IN NACL 20-0.9 MEQ/L-% IV SOLN
INTRAVENOUS | Status: DC
  Administered 2015-09-09: 06:00:00 via INTRAVENOUS
  Filled 2015-09-08 (×4): qty 1000

## 2015-09-08 MED ORDER — ALBUMIN HUMAN 5 % IV SOLN
INTRAVENOUS | Status: DC | PRN
  Administered 2015-09-08: 15:00:00 via INTRAVENOUS

## 2015-09-08 MED ORDER — BISACODYL 5 MG PO TBEC
5.0000 mg | DELAYED_RELEASE_TABLET | Freq: Every day | ORAL | Status: DC | PRN
  Administered 2015-09-14: 5 mg via ORAL
  Filled 2015-09-08: qty 1

## 2015-09-08 MED ORDER — LACTATED RINGERS IV SOLN
INTRAVENOUS | Status: DC
  Administered 2015-09-08 (×4): via INTRAVENOUS

## 2015-09-08 MED ORDER — SODIUM CHLORIDE 0.9% FLUSH
3.0000 mL | Freq: Two times a day (BID) | INTRAVENOUS | Status: DC
  Administered 2015-09-08 – 2015-09-09 (×3): 3 mL via INTRAVENOUS

## 2015-09-08 MED ORDER — DULOXETINE HCL 60 MG PO CPEP
60.0000 mg | ORAL_CAPSULE | Freq: Every day | ORAL | Status: DC
  Administered 2015-09-09 – 2015-09-15 (×7): 60 mg via ORAL
  Filled 2015-09-08 (×7): qty 1

## 2015-09-08 MED ORDER — SODIUM CHLORIDE 0.9% FLUSH
3.0000 mL | INTRAVENOUS | Status: DC | PRN
  Administered 2015-09-09: 3 mL via INTRAVENOUS
  Filled 2015-09-08: qty 3

## 2015-09-08 MED ORDER — ACETAMINOPHEN 650 MG RE SUPP: 650.0000 mg | RECTAL | Status: DC | PRN

## 2015-09-08 MED ORDER — NEOSTIGMINE METHYLSULFATE 10 MG/10ML IV SOLN
INTRAVENOUS | Status: AC
  Filled 2015-09-08: qty 1

## 2015-09-08 MED ORDER — SODIUM CHLORIDE 0.9 % IJ SOLN
INTRAMUSCULAR | Status: AC
  Filled 2015-09-08: qty 10

## 2015-09-08 MED ORDER — THROMBIN 20000 UNITS EX SOLR
CUTANEOUS | Status: AC
  Filled 2015-09-08: qty 20000

## 2015-09-08 MED ORDER — GLYCOPYRROLATE 0.2 MG/ML IJ SOLN
INTRAMUSCULAR | Status: AC
  Filled 2015-09-08: qty 3

## 2015-09-08 MED ORDER — ALBUTEROL SULFATE (2.5 MG/3ML) 0.083% IN NEBU: 3.0000 mL | INHALATION_SOLUTION | RESPIRATORY_TRACT | Status: DC | PRN

## 2015-09-08 MED ORDER — ZOLPIDEM TARTRATE 5 MG PO TABS
5.0000 mg | ORAL_TABLET | Freq: Every evening | ORAL | Status: DC | PRN
  Administered 2015-09-11: 5 mg via ORAL
  Filled 2015-09-08: qty 1

## 2015-09-08 MED ORDER — ROCURONIUM BROMIDE 50 MG/5ML IV SOLN
INTRAVENOUS | Status: AC
  Filled 2015-09-08: qty 1

## 2015-09-08 MED ORDER — ALUM & MAG HYDROXIDE-SIMETH 200-200-20 MG/5ML PO SUSP: 30.0000 mL | Freq: Four times a day (QID) | ORAL | Status: DC | PRN

## 2015-09-08 MED ORDER — IPRATROPIUM BROMIDE HFA 17 MCG/ACT IN AERS
INHALATION_SPRAY | RESPIRATORY_TRACT | Status: DC | PRN
  Administered 2015-09-08: 6 via RESPIRATORY_TRACT

## 2015-09-08 MED ORDER — SUCCINYLCHOLINE CHLORIDE 20 MG/ML IJ SOLN
INTRAMUSCULAR | Status: DC | PRN
  Administered 2015-09-08: 120 mg via INTRAVENOUS

## 2015-09-08 MED ORDER — GABAPENTIN 300 MG PO CAPS
300.0000 mg | ORAL_CAPSULE | Freq: Three times a day (TID) | ORAL | Status: DC
Start: 2015-09-08 — End: 2015-09-12
  Administered 2015-09-08 – 2015-09-12 (×11): 300 mg via ORAL
  Filled 2015-09-08 (×11): qty 1

## 2015-09-08 MED ORDER — ROCURONIUM BROMIDE 50 MG/5ML IV SOLN
INTRAVENOUS | Status: AC
Start: 2015-09-08 — End: 2015-09-08
  Filled 2015-09-08: qty 2

## 2015-09-08 MED ORDER — BUPIVACAINE HCL 0.5 % IJ SOLN
INTRAMUSCULAR | Status: DC | PRN
  Administered 2015-09-08: 20 mL

## 2015-09-08 MED ORDER — PHENOL 1.4 % MT LIQD: 1.0000 | OROMUCOSAL | Status: DC | PRN

## 2015-09-08 MED ORDER — CEFAZOLIN SODIUM 1-5 GM-% IV SOLN
1.0000 g | Freq: Three times a day (TID) | INTRAVENOUS | Status: AC
  Administered 2015-09-08 – 2015-09-09 (×2): 1 g via INTRAVENOUS
  Filled 2015-09-08 (×2): qty 50

## 2015-09-08 MED ORDER — ONDANSETRON HCL 4 MG/2ML IJ SOLN: 4.0000 mg | INTRAMUSCULAR | Status: DC | PRN

## 2015-09-08 MED ORDER — MENTHOL 3 MG MT LOZG: 1.0000 | LOZENGE | OROMUCOSAL | Status: DC | PRN

## 2015-09-08 MED ORDER — PROMETHAZINE HCL 25 MG/ML IJ SOLN
6.2500 mg | INTRAMUSCULAR | Status: DC | PRN
Start: 2015-09-08 — End: 2015-09-08

## 2015-09-08 MED ORDER — HYDROCHLOROTHIAZIDE 25 MG PO TABS
25.0000 mg | ORAL_TABLET | Freq: Every day | ORAL | Status: DC
  Administered 2015-09-08 – 2015-09-15 (×8): 25 mg via ORAL
  Filled 2015-09-08 (×8): qty 1

## 2015-09-08 MED ORDER — MIDAZOLAM HCL 2 MG/2ML IJ SOLN
INTRAMUSCULAR | Status: AC
  Filled 2015-09-08: qty 2

## 2015-09-08 MED ORDER — KETOROLAC TROMETHAMINE 30 MG/ML IJ SOLN
30.0000 mg | Freq: Once | INTRAMUSCULAR | Status: AC
  Administered 2015-09-09: 30 mg via INTRAVENOUS
  Filled 2015-09-08: qty 1

## 2015-09-08 MED ORDER — BUPIVACAINE HCL (PF) 0.5 % IJ SOLN
INTRAMUSCULAR | Status: AC
  Filled 2015-09-08: qty 20

## 2015-09-08 MED ORDER — BUPIVACAINE LIPOSOME 1.3 % IJ SUSP
20.0000 mL | INTRAMUSCULAR | Status: AC
  Administered 2015-09-08: 20 mL
  Filled 2015-09-08: qty 20

## 2015-09-08 MED ORDER — LIDOCAINE HCL (CARDIAC) 20 MG/ML IV SOLN
INTRAVENOUS | Status: DC | PRN
  Administered 2015-09-08: 80 mg via INTRAVENOUS

## 2015-09-08 MED ORDER — PROPOFOL 10 MG/ML IV BOLUS
INTRAVENOUS | Status: DC | PRN
  Administered 2015-09-08: 50 mg via INTRAVENOUS
  Administered 2015-09-08: 150 mg via INTRAVENOUS

## 2015-09-08 MED ORDER — NEBIVOLOL HCL 10 MG PO TABS
10.0000 mg | ORAL_TABLET | Freq: Every day | ORAL | Status: DC
  Administered 2015-09-09 – 2015-09-15 (×7): 10 mg via ORAL
  Filled 2015-09-08 (×7): qty 1

## 2015-09-08 MED ORDER — BENAZEPRIL HCL 20 MG PO TABS
20.0000 mg | ORAL_TABLET | Freq: Every day | ORAL | Status: DC
  Administered 2015-09-08 – 2015-09-15 (×8): 20 mg via ORAL
  Filled 2015-09-08 (×8): qty 1

## 2015-09-08 MED ORDER — SUCCINYLCHOLINE CHLORIDE 20 MG/ML IJ SOLN
INTRAMUSCULAR | Status: AC
  Filled 2015-09-08: qty 1

## 2015-09-08 MED ORDER — FENTANYL CITRATE (PF) 100 MCG/2ML IJ SOLN
INTRAMUSCULAR | Status: DC | PRN
  Administered 2015-09-08 (×7): 50 ug via INTRAVENOUS

## 2015-09-08 MED ORDER — AMLODIPINE BESYLATE 5 MG PO TABS
5.0000 mg | ORAL_TABLET | Freq: Every day | ORAL | Status: DC
  Administered 2015-09-09 – 2015-09-15 (×7): 5 mg via ORAL
  Filled 2015-09-08 (×7): qty 1

## 2015-09-08 MED ORDER — ONDANSETRON HCL 4 MG/2ML IJ SOLN
INTRAMUSCULAR | Status: DC | PRN
  Administered 2015-09-08: 4 mg via INTRAVENOUS

## 2015-09-08 MED ORDER — METHOCARBAMOL 500 MG PO TABS
500.0000 mg | ORAL_TABLET | Freq: Four times a day (QID) | ORAL | Status: DC | PRN
  Administered 2015-09-08 – 2015-09-12 (×7): 500 mg via ORAL
  Filled 2015-09-08 (×8): qty 1

## 2015-09-08 SURGICAL SUPPLY — 57 items
ADH SKN CLS APL DERMABOND .7 (GAUZE/BANDAGES/DRESSINGS) ×1
APL SKNCLS STERI-STRIP NONHPOA (GAUZE/BANDAGES/DRESSINGS) ×1
BENZOIN TINCTURE PRP APPL 2/3 (GAUZE/BANDAGES/DRESSINGS) ×1 IMPLANT
BUR RND FLUTED 2.5 (BURR) IMPLANT
BUR SABER RD CUTTING 3.0 (BURR) IMPLANT
CANISTER SUCTION 2500CC (MISCELLANEOUS) ×2 IMPLANT
COVER SURGICAL LIGHT HANDLE (MISCELLANEOUS) ×2 IMPLANT
DERMABOND ADVANCED (GAUZE/BANDAGES/DRESSINGS) ×1
DERMABOND ADVANCED .7 DNX12 (GAUZE/BANDAGES/DRESSINGS) ×1 IMPLANT
DRAPE INCISE IOBAN 66X45 STRL (DRAPES) IMPLANT
DRAPE MICROSCOPE LEICA (MISCELLANEOUS) ×2 IMPLANT
DRAPE PROXIMA HALF (DRAPES) IMPLANT
DRAPE SURG 17X23 STRL (DRAPES) ×8 IMPLANT
DRSG MEPILEX BORDER 4X4 (GAUZE/BANDAGES/DRESSINGS) IMPLANT
DRSG MEPILEX BORDER 4X8 (GAUZE/BANDAGES/DRESSINGS) ×1 IMPLANT
DURAPREP 26ML APPLICATOR (WOUND CARE) ×2 IMPLANT
DURASEAL SPINE SEALANT 3ML (MISCELLANEOUS) ×1 IMPLANT
ELECT REM PT RETURN 9FT ADLT (ELECTROSURGICAL) ×2
ELECTRODE REM PT RTRN 9FT ADLT (ELECTROSURGICAL) ×1 IMPLANT
EVACUATOR 1/8 PVC DRAIN (DRAIN) IMPLANT
GLOVE BIOGEL PI IND STRL 8 (GLOVE) ×1 IMPLANT
GLOVE BIOGEL PI INDICATOR 8 (GLOVE) ×1
GLOVE ECLIPSE 9.0 STRL (GLOVE) ×2 IMPLANT
GLOVE ORTHO TXT STRL SZ7.5 (GLOVE) ×2 IMPLANT
GLOVE SURG 8.5 LATEX PF (GLOVE) ×2 IMPLANT
GOWN STRL REUS W/ TWL LRG LVL3 (GOWN DISPOSABLE) ×1 IMPLANT
GOWN STRL REUS W/TWL 2XL LVL3 (GOWN DISPOSABLE) ×4 IMPLANT
GOWN STRL REUS W/TWL LRG LVL3 (GOWN DISPOSABLE) ×2
KIT BASIN OR (CUSTOM PROCEDURE TRAY) ×2 IMPLANT
KIT ROOM TURNOVER OR (KITS) ×2 IMPLANT
NDL SPNL 18GX3.5 QUINCKE PK (NEEDLE) ×2 IMPLANT
NEEDLE SPNL 18GX3.5 QUINCKE PK (NEEDLE) ×4 IMPLANT
NS IRRIG 1000ML POUR BTL (IV SOLUTION) ×2 IMPLANT
PACK LAMINECTOMY ORTHO (CUSTOM PROCEDURE TRAY) ×2 IMPLANT
PAD ARMBOARD 7.5X6 YLW CONV (MISCELLANEOUS) ×4 IMPLANT
PATTIES SURGICAL .5 X.5 (GAUZE/BANDAGES/DRESSINGS) IMPLANT
PATTIES SURGICAL .5 X1 (DISPOSABLE) ×1 IMPLANT
PATTIES SURGICAL .5X1.5 (GAUZE/BANDAGES/DRESSINGS) ×1 IMPLANT
PATTIES SURGICAL .75X.75 (GAUZE/BANDAGES/DRESSINGS) IMPLANT
PATTIES SURGICAL 1X1 (DISPOSABLE) IMPLANT
SPONGE LAP 4X18 X RAY DECT (DISPOSABLE) IMPLANT
SPONGE SURGIFOAM ABS GEL 100 (HEMOSTASIS) IMPLANT
STRIP CLOSURE SKIN 1/2X4 (GAUZE/BANDAGES/DRESSINGS) ×1 IMPLANT
SURGIFLO W/THROMBIN 8M KIT (HEMOSTASIS) ×1 IMPLANT
SUT NURALON 4 0 TR CR/8 (SUTURE) ×1 IMPLANT
SUT VIC AB 0 CT1 27 (SUTURE)
SUT VIC AB 0 CT1 27XBRD ANBCTR (SUTURE) IMPLANT
SUT VIC AB 1 CT1 27 (SUTURE)
SUT VIC AB 1 CT1 27XBRD ANBCTR (SUTURE) IMPLANT
SUT VIC AB 2-0 CT1 27 (SUTURE)
SUT VIC AB 2-0 CT1 TAPERPNT 27 (SUTURE) IMPLANT
SUT VIC AB 3-0 X1 27 (SUTURE) ×2 IMPLANT
SUT VICRYL 0 UR6 27IN ABS (SUTURE) ×2 IMPLANT
TOWEL OR 17X24 6PK STRL BLUE (TOWEL DISPOSABLE) ×2 IMPLANT
TOWEL OR 17X26 10 PK STRL BLUE (TOWEL DISPOSABLE) ×2 IMPLANT
TRAY FOLEY CATH 16FRSI W/METER (SET/KITS/TRAYS/PACK) IMPLANT
WATER STERILE IRR 1000ML POUR (IV SOLUTION) ×2 IMPLANT

## 2015-09-08 NOTE — Brief Op Note (Signed)
PATIENT ID:      [name]  MRN:     [id number] DOB/AGE:    [birth year] / 70 y.o.       OPERATIVE REPORT   DATE OF PROCEDURE:  09/08/2015      PREOPERATIVE DIAGNOSIS:   severe spinal stenosis L3-4, L4-5 and lateral recess L5-S1                                                       Body mass index is 43.56 kg/(m^2).    POSTOPERATIVE DIAGNOSIS:   severe spinal stenosis L3-4, L4-5 and lateral recess L5-S1                                                                     Body mass index is 43.56 kg/(m^2).    PROCEDURE:  Procedure(s):CENTRAL DECOMPRESSIVE LUMBAR LAMINECTOMIES L3-4, L4-5 AND BILATERAL HEMILAMINECTOMY L5-S1    SURGEON: NITKA,JAMES E   ASSISTANT:   Benjiman Core, PA-C          ANESTHESIA:  General and supplemented with local anesthesitic marcaine 0.5% 1:1 exparel 1.3% total 20cc, Dr. Landry Dyke.     DRAINS: Foley to SD.  EBL: [id number]  COMPLICATIONS:  Dural tear right L3-4, posterolateral dural sac, no neural element involved. Occurred with the resection Of the medial right L3-4 facet, pull of the dural adhesion to the medial facet. 49mm opening repaired with 4-0 neurolon and duraseal.    CONDITION:  stable    NITKA,JAMES E 09/08/2015, 5:19 PM

## 2015-09-08 NOTE — Progress Notes (Signed)
Patient admitted to room 5C16 from PACU via gurney and accompanied by two nurses.  She is S/P central decompressive lumbar laminectomies L3-4, L4-5, and bilateral hemilaminectomy L5-S1.  Family also in attendance.  Patient groggy / denies pain.  Patient and family oriented to room and unit routine.  Patient to remain on strict bedrest as per surgeon.  This was explained to both patient and family.  Bed in low position with brakes and bed alarm on.  Call bell placed within patient's reach.

## 2015-09-08 NOTE — Anesthesia Procedure Notes (Signed)
Procedure Name: Intubation Date/Time: 09/08/2015 1:06 PM Performed by: Scheryl Darter Pre-anesthesia Checklist: Patient identified, Emergency Drugs available, Suction available, Patient being monitored and Timeout performed Patient Re-evaluated:Patient Re-evaluated prior to inductionOxygen Delivery Method: Circle system utilized Preoxygenation: Pre-oxygenation with 100% oxygen Intubation Type: IV induction Ventilation: Mask ventilation without difficulty Laryngoscope Size: Miller and 2 Grade View: Grade I Tube type: Oral Tube size: 7.0 mm Number of attempts: 1 Airway Equipment and Method: Stylet Placement Confirmation: ETT inserted through vocal cords under direct vision,  positive ETCO2 and breath sounds checked- equal and bilateral Secured at: 21 cm Tube secured with: Tape Dental Injury: Teeth and Oropharynx as per pre-operative assessment

## 2015-09-08 NOTE — Discharge Instructions (Addendum)
° ° °  No lifting greater than 10 lbs. Avoid bending, stooping and twisting. Walk in house for first week them may start to get out slowly increasing distance up to one quarter mile by 6 weeks post op. May bath, may use tegaderm or similar water impervious dressing for 3-4 more days then as needed. Laminectomy, Care After Refer to this sheet in the next few weeks. These instructions provide you with information about caring for yourself after your procedure. Your health care provider may also give you more specific instructions. Your treatment has been planned according to current medical practices, but problems sometimes occur. Call your health care provider if you have any problems or questions after your procedure. WHAT TO EXPECT AFTER THE PROCEDURE After your procedure, it is common to have some pain around the incision. HOME CARE INSTRUCTIONS Bathing  You may shower after the bandage (dressing) has been removed or as directed by your health care provider.  Do not take baths, swim, or use a hot tub for 2 weeks or until your incision has healed completely. Check with your health care provider before you start any of these activities. Incision Care  There are many different ways to close and cover an incision, including stitches, skin glue, and adhesive strips. Follow instructions from your health care provider about:  Incision care.  Dressing changes and removal.  Incision closure removal.  Check your incision area every day for signs of infection. Watch for:  Redness, swelling, or pain.  Fluid, blood, or pus. Activity  Return to your normal activities as directed by your health care provider. Ask your health care provider what activities are safe for you.  Perform breathing exercises if directed by your health care provider.  Avoid bending or twisting at your waist. Always bend at your knees.  Do not sit for more than 20-30 minutes at a time. Lie down or walk between periods  of sitting.  Do not lift anything that is heavier than 10 lb (4.5 kg).  Do not drive for 2 weeks after your procedure or as directed by your health care provider. You may be a passenger for 20-30 minute trips.  Do not drive or operate heavy machinery while taking pain medicine. General Instructions  Take medicines only as directed by your health care provider.  Keep all follow-up visits as directed by your health care provider. This is important. SEEK MEDICAL CARE IF:  You have redness, swelling, or increasing pain in the area of your incision.  You notice a bad smell coming from the incision or dressing.  You are not able to return to activities or perform exercises as instructed by your health care provider. SEEK IMMEDIATE MEDICAL CARE IF:  You develop a rash.  You have fluid, blood, or pus coming from your incision.  You have chills or a fever.  You have episodes of dizziness or fainting while standing.  You develop shortness of breath or you have difficulty breathing.  You lose the ability to control your bladder or bowel.  You become weak.  You cannot use your legs.   This information is not intended to replace advice given to you by your health care provider. Make sure you discuss any questions you have with your health care provider.   Document Released: 01/28/2005 Document Revised: 11/25/2014 Document Reviewed: 02/20/2013 Elsevier Interactive Patient Education [employer].

## 2015-09-08 NOTE — Op Note (Signed)
09/08/2015  5:51 PM  PATIENT:  [name]  70 y.o. female  MRN: [id number]  OPERATIVE REPORT  PRE-OPERATIVE DIAGNOSIS:  severe spinal stenosis L3-4, L4-5 and lateral recess L5-S1  POST-OPERATIVE DIAGNOSIS:  severe spinal stenosis L3-4, L4-5 and lateral recess L5-S1  PROCEDURE:  Procedure(s): CENTRAL DECOMPRESSIVE LUMBAR LAMINECTOMIES L3-4, L4-5 AND BILATERAL HEMILAMINECTOMY L5-S1    SURGEON:  Jessy Oto, MD     ASSISTANT:  Benjiman Core, PA-C  (Present throughout the entire procedure and necessary for completion of procedure in a timely manner)     ANESTHESIA:  General,supplemented with local marcaine 1/2% plain:exparel 1.3% 1:1. Total of 20 cc were used. Dr. Landry Dyke.    EBL:400CC  DRAINS: Foley to SD.  COMPLICATIONS:  Dural tear right posterolat L3-4 level of facet,60m, closed with 6x 4-0 Neurolon sutures and duraseal. Occurred with the removal of the medial facet osteotomized bone fragments.  PROCEDURE: The patient was met in the holding area, and the appropriate L3-4 to L5-S1 levels identified and not marked with an "X" and my initials since this was a bilateral procedure. The patient was then transported to OR and was placed under general anesthesia without difficulty. Then placed on the operative table in a prone position. [facility]. The patient received appropriate preoperative antibiotic prophylaxis. Nursing staff inserted a Foley catheter under sterile conditions prior to turning. Standard prep with DuraPrep solution from the mid dorsal spine to the mid sacral level.Time-out procedure was called and correct .  Patient was draped in the usual manner. Iodine Vi-Drape was used.The skin was infiltrated with marcaine 1/2% plain:exparel 1.3% 1:1. Total of 20 cc were used. Skin subcutaneous layers divide down to the lumbodorsal fascia this was incised in both L3 and superior L4 spinous processes, Initial incision were made at the expected L3 and L4 levels  approximately 3 1/2inches in length.Kocher clamp placed on the spinous process of L3. Intraoperative lateral cross table lateral radiograph demonstrated the clamp placed posterior at the L3 spinous process level. The L3-4 to L5-S1 levels were then exposed using Cobb elevators and electrocautery. These areas were then packed. 2 Insight retractors were inserted at the incision site. Leksell rongeur used to remove the inferior 20% of spinous process of L2 and the entire spinous processes of L3 and of L4. Leksell rongeur was then used to further remove bone down to the ligamentum flavum and the central portions of the lamina and base of the spinous process were thinned. 4 mm burr was also used to thin the lamina of L3 and L4 centrally. The facets were then exposed out laterally and were hypertrophic.Osteotomes then used medial aspect of the L3-4 facet and L2-3 facet bilaterally resecting approximately 15-20% of the facet bilaterally. The operating room microscope sterilely draped brought into the field.  Kerrisons were then used to resect central portions of the entire lamina of L4 and the entire L3 lamina. Ligamentum flavum at the L2-3 L3-4 and L4-5 levels were then carefully resected centrally preserving at least 7-8 mm with at the pars level L4 and L3. The central laminectomy was further widened performing more resection of the medial aspect of facet at both L3-4 and L4-5. During the resection of the fragments of the osteotomized bone off the medial right L3-4 facet a dural tear occurred. This was after the osteotomy and while lifting the medial facet fragment, adherent dural pulled an opening about 3-435min cranial caudle dimension and 2-3 mm medial lateral direction. The arachnoid  was intact.  The dural tear assessed and protected with cottonoids. The area of the central laminectomy superiorly was hemostased With bone wax to the bleeding bone surfaces and thrombin soak gel foam and cottonoids. The superior and  inferior margins of the dural tear then placed under slight tension with 4-0 Neurolon stay sutures. This approximated the tear edges side to side and the tear then closed with interrupted 4-0 Neurolon sutures a total of 6 sutures placed, the patient valsalvaed to 46m Hg showing no further leak. The area of the dural tear then covered with cottonoids and the procedure continued. The right side of the spinal canal decompressed at each level beginning at the right L5 neuroforamen resect bone over the superior lamina of L5 and decompressing the right L5 foramen and reflected portion ligamentum flavum off the medial aspect of the right L4-5 facet. Hockey-stick nerve probe could be passed out the L3, L4 and L5 neuroforamen. The medial aspect of facet at the L2-3 level was then carefully evaluated and hypertrophic ligamentum flavum resected using Kerrisons decompressing the lateral recess on this right side. This was done such that hockey-stick nerve probe could be used to pass the outer recess demonstrating patency and decompression of the L3, L4 and L5 nerve roots. Attention then turned to the L2-3 were further debridement of the lateral recess and hypertrophic ligamentum flavum and reflected portions of ligamentum flavum were carried out. The decompression was carried out in similar fashion on the left side from the left L5 nerve root to the L2-3 level. At the left L3-4 level severe lateral recess stenosis was noted lateral recess was decompressed and the L4 nerve root freed up.  Further decompression was then carried out the left side of the spinal canal decompressing the L3, L4 and the L5 nerve roots. Following this then a hockey-stick nerve probe could be passed out easily bilateral L3 L4 and L5  Neuroforamen.  The bilateral L5-S1 levels were then exposed using Cobb elevators and electrocautery. These areas were then packed. Insight  retractor was inserted at the caudal portion of incision site. Leksell rongeur  used to remove a portion of the inferior aspect is process of L5 60% and the superior 30% of S1. Leksell rongeur was then used to further remove bone down to the ligamentum flavum and the central portions of the lamina and base of the spinous process were thinned. 4 mm burr was also used to thin the the inferior 50% of the lamina of L5 centrally. The facets were then exposed out laterally and were hypertrophic.Osteotomes then used medial aspect of the L5-S1 facets resecting approximately 10-15% of the facet bilaterally. Kerrisons were then used to resect central portions of the lamina of the inferior 50% of L5lamina. Ligamentum flavum at the L5-S1 levels were then carefully resected centrally preserving at least 7-8 mm with at the pars level L5. The laminectomy was further widened performing more resection of the medial aspect of facet at both L5-S1. The central portions of the ligamentum flavum at the L5-S1 level were resected and the medial aspect of the L5-S1 facet resected over about 5-10%. Hypertrophic flava was found to be present. Following decompression a hockey stick neuroprobe could be passed out the bilateral S1 neuroforamen and the L5 neuroforamen.   Following decompression bilaterally thrombin-soaked Gelfoam was applied to the laminotomy defects and these areas packed with small sponges. Adequate hemostasis was obtained at all levels. The thrombin-soaked Gelfoam was then removed. DuraSeal was then applied over the  right dura at the L3-4 level. The insight retractor was then removed. The end of the case, an approximately 15 mm central laminotomy was present at the L2 to L5 level and over the inferior 50% of the L5 laminal with normal pulsation of the thecal sac present. Following additional irrigation and with no active bleeding present at any level level, the incision was then closed by first approximating the lumbar muscles the midline with interrupted #1 Vicryl sutures loosely the lumbodorsal fascia  then approximated with interrupted #1 Vicryl sutures. Deep subcutaneous layers approximated with interrupted #0 Vicryl suture more superficial layers with interrupted 2-0 Vicryl suture the skin closed with a running subcutaneous stitch of 4-0 Vicryl. Dermabond was applied to the skin margins.  A mepilex bandage was applied to the midline incision site.. All instrument and sponge counts were correct. Patient was then reactivated returned to supine position and extubated. She was then returned to recovery room in satisfactory condition.   Benjiman Core, PA-C performed the duties of assistant surgeon throughout this case he assisted using loupe magnification and OR microscope retracting delicate neural structures suctioning over the neural structures throughout the case bilaterally. He was present from the beginning of the case to the end of the case. Assisted in positioning the patient.He also participated in removal the patient from the operating table.    NITKA,JAMES E  09/08/2015, 5:51 PM

## 2015-09-08 NOTE — Interval H&P Note (Signed)
Patient was seen and examined in the preop holding area. There has been no interval  Change in this patient's exam preop  history and physical exam  Lab tests and images have been examined and reviewed.  The Risks benefits and alternative treatments have been discussed  extensively,questions answered.  The patient has elected to undergo the discussed surgical treatment. 

## 2015-09-08 NOTE — H&P (Signed)
[name] is an 70 y.o. female.   CHIEF COMPLAINT:  Low back pain.   HISTORY OF PRESENT ILLNESS:  This patient is a 70 year old female.  She complains of increasing back pain and increasing pain to where she is experiencing discomfort even at nighttime.  Pain is radiating in the left lower extremity along the side of the hip.  She is taking medicines of hydrocodone.  No recent hospitalizations.  She, at her last visit, was felt to have clinical findings consistent with a problem of neurogenic claudication.  Not sure if pain is in her back more than her leg.  She is requiring hydrocodone to relieve the discomfort.   She is able to stand well and is able to ambulate quite well.  No problems with walking or with standing in one place.  Almost all of her pain is in her back and some radiation into her buttock and thighs.  Also describes pain with prolonged sitting, bending and stooping.   RADIOGRAPHS:  The results of her MRI scan are reviewed with her.  These show normal cord terminating at L1.  There is some retrolisthesis of L1 on L2 of about 4 mm and a 2-mm anterolisthesis of L3 on L4.  Her degree of stenosis associated with these levels is judged to be moderate and mild.  At L1-2, mild without foraminal narrowing with a mild disc bulge at L2-3.  Moderate central stenosis with moderate facet arthropathy.  No evidence of neuroforaminal stenosis.  L3-4, severe joint arthritis changes with severe spinal stenosis findings.  No evidence of neuroforaminal stenosis.  At L4-5, she has a disc osteophyte complex with moderate bilateral facet arthropathy, ligamentum flavum and folding causing severe stenosis as well.   MEDICATIONS:  No new medicines since her last visit.    ALLERGIES:  No known allergies.   SOCIAL HISTORY:  Does not smoke.     Past Medical History  Diagnosis Date  . Cancer [facility])     breast  . Coronary artery disease   . Hyperlipidemia     not on any meds  . Breast cancer ([place])   . OA  (osteoarthritis) of knee   . OSA (obstructive sleep apnea)   . Depression     takes CYmbalta and Wellbutrin daily  . Asthma     Albuterol inhaler as needed.Pulmicort neb as needed  . Asthma   . Hypertension     takes Benazepril,Bystolic,and Amlodipine daily  . GERD (gastroesophageal reflux disease)     takes Omeprazole daily  . Peripheral edema     takes daily as needed  . Dyspnea     with exertion occasionally when lies down  . Pneumonia     hx of-several yrs ago  . Headache     oocasionally  . Weakness     numbness and tingling mainly in left leg occasionally in right.Tingling/numbness in hands  . Peripheral neuropathy ([place])   . Joint pain   . Joint swelling   . Osteoarthritis   . RA (rheumatoid arthritis) ([place])   . Chronic back pain     spinal stenosis  . IBS (irritable bowel syndrome)   . History of kidney stones   . Urinary frequency   . Urinary urgency   . Nocturia   . Diabetes mellitus     not on any meds/controlled by diet  . Diabetes mellitus type II     Past Surgical History  Procedure Laterality Date  . Triple bypass  04/27/05  .  Cardiac catheterization  04/23/2004    EF 60%  . Coronary artery bypass graft  2005    LIMA GRAFT TO LAD, SAPHENOUS VEIN GRAFT TO THE FIRST DIAGONAL, AND LEFT RADIAL ARTERY GRAFT TO THE OM  . Breast lumpectomy      RIGHT BREAST  . Wrist surgery Left   . US echocardiography  01/06/2009    EF 55-60%  . Total knee arthroplasty Bilateral   . Joint replacement    . Foot surgery Right   . Colonoscopy    . Nodule removed from left elbow    . Lithotripsy    . Cataract surgery Bilateral     Family History  Problem Relation Age of Onset  . Heart disease Mother   . Cancer Father   . Cancer Brother   . Heart disease Brother    Social History:  reports that she has never smoked. She does not have any smokeless tobacco history on file. She reports that she does not drink alcohol or use illicit drugs.  Allergies:  Allergies   Allergen Reactions  . Statins Other (See Comments)    Joint pain and swelling/burning    No prescriptions prior to admission    No results found for this or any previous visit (from the past 48 hour(s)). No results found.  Review of Systems  Constitutional: Negative.   HENT: Negative.   Eyes: Negative.   Respiratory: Negative.   Cardiovascular: Negative.   Gastrointestinal: Negative.   Genitourinary: Negative.   Musculoskeletal: Positive for back pain.  Skin: Negative.   Psychiatric/Behavioral: Negative.     There were no vitals taken for this visit. Physical Exam  Constitutional: She is oriented to person, place, and time. No distress.  HENT:  Head: Atraumatic.  Eyes: EOM are normal.  Neck: Normal range of motion.  Respiratory: No respiratory distress.  GI: She exhibits no distension.  Musculoskeletal: She exhibits tenderness.  Neurological: She is alert and oriented to person, place, and time.  Skin: Skin is warm and dry.  Psychiatric: She has a normal mood and affect.    PHYSICAL EXAMINATION:  Her sciatic tension tests are unremarkable, but this is in keeping with problems of severe lumbar spinal stenosis.     ASSESSMENT:  This patient's MRI scan returned with significant stenosis in the L3-4, L4-5 and L5-S1 levels.  Mild to moderate left foraminal narrowing at L5-S1.  My assessment is worsening neurogenic claudication symptoms, now with pain at nighttime, suggestive of increasing nerve compression to where she is unable to obtain relief with changing position.  The degree of slip is felt to be minimal at the L3-4 level and we would avoid fusion of this segment while decompressing L3-4, L4-5 and L5-S1.   PLAN:  Discussed with Alyzon the fact that her pain is becoming more severe and she is experiencing night pain.  Discussed with the patient the risks and benefits of considering intervention, which would be a decompressive laminectomy for severe lumbar spinal stenosis  and this would be at the L3-4 and L4-5 levels.  Decompression centrally at both levels and bilateral hemilaminectomies at L5-S1 where she has some lateral recess stenosis that is apparent.  Discussed the risks of surgery including risks of infection, bleeding, and risks of neurologic compromise.  She is on medications of methotrexate intermittently and Plaquenil.  Will need to know when these will need to be discontinued in regard to her surgical treatment and when they can be restarted.  She wishes to proceed  with the surgery.  Will go ahead and schedule her for this and will need to send a note to Dr. Estanislado Pandy requesting when she should stop the medications of methotrexate and Plaquenil for consideration of surgical intervention. Lanae Crumbly, PA-C 09/08/2015, 9:29 AM

## 2015-09-08 NOTE — Anesthesia Preprocedure Evaluation (Addendum)
Anesthesia Evaluation  Patient identified by MRN, date of birth, ID band Patient awake    Reviewed: Allergy & Precautions, NPO status , Patient's Chart, lab work & pertinent test results  History of Anesthesia Complications Negative for: history of anesthetic complications  Airway Mallampati: II  TM Distance: >3 FB Neck ROM: Full    Dental  (+) Edentulous Upper   Pulmonary shortness of breath, asthma , sleep apnea ,    breath sounds clear to auscultation       Cardiovascular hypertension, + CAD and + CABG   Rhythm:Regular Rate:Normal     Neuro/Psych  Neuromuscular disease    GI/Hepatic GERD  ,  Endo/Other  diabetesMorbid obesity  Renal/GU      Musculoskeletal   Abdominal (+) + obese,   Peds  Hematology   Anesthesia Other Findings   Reproductive/Obstetrics                            Anesthesia Physical Anesthesia Plan  ASA: III  Anesthesia Plan: General   Post-op Pain Management:    Induction: Intravenous  Airway Management Planned: Oral ETT  Additional Equipment:   Intra-op Plan:   Post-operative Plan: Extubation in OR  Informed Consent: I have reviewed the patients History and Physical, chart, labs and discussed the procedure including the risks, benefits and alternatives for the proposed anesthesia with the patient or authorized representative who has indicated his/her understanding and acceptance.   Dental advisory given  Plan Discussed with: CRNA and Surgeon  Anesthesia Plan Comments:         Anesthesia Quick Evaluation

## 2015-09-08 NOTE — Transfer of Care (Signed)
Immediate Anesthesia Transfer of Care Note  Patient: [name]  Procedure(s) Performed: Procedure(s): CENTRAL DECOMPRESSIVE LUMBAR LAMINECTOMIES L3-4, L4-5 AND BILATERAL HEMILAMINECTOMY L5-S1 (N/A)  Patient Location: PACU  Anesthesia Type:General  Level of Consciousness: awake, alert , oriented and patient cooperative  Airway & Oxygen Therapy: Patient Spontanous Breathing and Patient connected to nasal cannula oxygen  Post-op Assessment: Report given to RN and Post -op Vital signs reviewed and stable  Post vital signs: Reviewed and stable  Last Vitals:  Filed Vitals:   09/08/15 1105 09/08/15 1720  BP: 146/62 130/68  Pulse: 70 80  Temp: 36.8 C 37.2 C  Resp: 20 10    Complications: No apparent anesthesia complications

## 2015-09-09 ENCOUNTER — Encounter (HOSPITAL_COMMUNITY): Payer: Self-pay | Admitting: Specialist

## 2015-09-09 LAB — BASIC METABOLIC PANEL
ANION GAP: 12 (ref 5–15)
BUN: 10 mg/dL (ref 6–20)
CALCIUM: 8.4 mg/dL — AB (ref 8.9–10.3)
CHLORIDE: 102 mmol/L (ref 101–111)
CO2: 25 mmol/L (ref 22–32)
Creatinine, Ser: 0.56 mg/dL (ref 0.44–1.00)
GFR calc non Af Amer: >60 mL/min (ref >60)
GLUCOSE: 135 mg/dL — AB (ref 65–99)
POTASSIUM: 3.7 mmol/L (ref 3.5–5.1)
Sodium: 139 mmol/L (ref 135–145)

## 2015-09-09 LAB — CBC
HEMATOCRIT: 31.7 % — AB (ref 36.0–46.0)
HEMOGLOBIN: 10.2 g/dL — AB (ref 12.0–15.0)
MCH: 31.2 pg (ref 26.0–34.0)
MCHC: 32.2 g/dL (ref 30.0–36.0)
MCV: 96.9 fL (ref 78.0–100.0)
Platelets: 137 10*3/uL — ABNORMAL LOW (ref 150–400)
RBC: 3.27 MIL/uL — AB (ref 3.87–5.11)
RDW: 15 % (ref 11.5–15.5)
WBC: 8.8 10*3/uL (ref 4.0–10.5)

## 2015-09-09 MED ORDER — PANTOPRAZOLE SODIUM 40 MG PO TBEC
40.0000 mg | DELAYED_RELEASE_TABLET | Freq: Every day | ORAL | Status: DC
  Administered 2015-09-09 – 2015-09-14 (×6): 40 mg via ORAL
  Filled 2015-09-09 (×6): qty 1

## 2015-09-09 MED FILL — Thrombin For Soln 20000 Unit: CUTANEOUS | Qty: 1 | Status: AC

## 2015-09-09 NOTE — Progress Notes (Signed)
     Subjective: 1 Day Post-Op Procedure(s) (LRB): CENTRAL DECOMPRESSIVE LUMBAR LAMINECTOMIES L3-4, L4-5 AND BILATERAL HEMILAMINECTOMY L5-S1 (N/A) Awake, alert and oriented x 4. Foley to SD, will keep in place as she is at bedrest for 2 more days. Complains of left shoulder discomfort With ROM and anterior flexion likely impingement from arms at sides will under anesthesia. Legs without numbness or weakness or pain. No headaches or nuchal symptoms. Patient reports pain as moderate.    Objective:   VITALS:  Temp:  [97.5 F (36.4 C)-99 F (37.2 C)] 98.4 F (36.9 C) (02/15 0550) Pulse Rate:  [65-80] 65 (02/15 0550) Resp:  [10-20] 18 (02/15 0550) BP: (98-146)/(47-68) 108/47 mmHg (02/15 0550) SpO2:  [96 %-100 %] 100 % (02/15 0550) FiO2 (%):  [2 %] 2 % (02/14 1851) Weight:  [257 lb 11 oz (116.886 kg)] 257 lb 11 oz (116.886 kg) (02/14 1105)  Neurologically intact ABD soft Neurovascular intact Sensation intact distally Intact pulses distally Dorsiflexion/Plantar flexion intact Incision: no drainage No cellulitis present Left UE  Motor intact positive impingement sign left shoulder. Good cuff strength.  LABS  Recent Labs  09/09/15 0520  HGB 10.2*  WBC 8.8  PLT 137*    Recent Labs  09/09/15 0520  NA 139  K 3.7  CL 102  CO2 25  BUN 10  CREATININE 0.56  GLUCOSE 135*   No results for input(s): LABPT, INR in the last 72 hours.   Assessment/Plan: 1 Day Post-Op Procedure(s) (LRB): CENTRAL DECOMPRESSIVE LUMBAR LAMINECTOMIES L3-4, L4-5 AND BILATERAL HEMILAMINECTOMY L5-S1 (N/A) Mild anemia post decompression Left shoulder discomfort likely related to position and impingement on RC  Advance diet Continue foley due to urinary output monitoring and 2 more days of strict bedrest and decrease valsalva due to CSF leak repair.  OT to see patient for left shoulder discomfort for ROM, light stretching and strengthening. PT/OT not able to start routine post laminectomy till  Friday AM. Allow patient to log roll.  NITKA,JAMES E 09/09/2015, 8:41 AM

## 2015-09-09 NOTE — Progress Notes (Signed)
OT Cancellation Note  Patient Details Name: [name] MRN: [id number] DOB: Sep 02, 1945   Cancelled Treatment:    Reason Eval/Treat Not Completed:  Pt on bed rest. Please update activity orders when appropriate for OT evaluation.  Benito Mccreedy OTR/L [id number] 09/09/2015, 2:17 PM

## 2015-09-09 NOTE — Care Management Note (Signed)
Case Management Note  Patient Details  Name: [name] MRN: [id number] Date of Birth: 1945/09/26  Subjective/Objective:     Patient admitted and underwent lumbar surgery. Patient currently on bedrest d/t CSF leak. She is from home with family.                Action/Plan: Waiting for patient to be off bedrest for PT/OT evaluations. CM following for discharge disposition.   Expected Discharge Date:                  Expected Discharge Plan:     In-House Referral:     Discharge planning Services     Post Acute Care Choice:    Choice offered to:     DME Arranged:    DME Agency:     HH Arranged:    HH Agency:     Status of Service:  In process, will continue to follow  Medicare Important Message Given:    Date Medicare IM Given:    Medicare IM give by:    Date Additional Medicare IM Given:    Additional Medicare Important Message give by:     If discussed at [place] of Stay Meetings, dates discussed:    Additional Comments:  Pollie Friar, RN 09/09/2015, 12:05 PM

## 2015-09-09 NOTE — Anesthesia Postprocedure Evaluation (Signed)
Anesthesia Post Note  Patient: [name]  Procedure(s) Performed: Procedure(s) (LRB): CENTRAL DECOMPRESSIVE LUMBAR LAMINECTOMIES L3-4, L4-5 AND BILATERAL HEMILAMINECTOMY L5-S1 (N/A)  Patient location during evaluation: PACU Anesthesia Type: General Level of consciousness: awake and alert Pain management: pain level controlled Vital Signs Assessment: post-procedure vital signs reviewed and stable Respiratory status: spontaneous breathing, nonlabored ventilation, respiratory function stable and patient connected to nasal cannula oxygen Cardiovascular status: blood pressure returned to baseline and stable Postop Assessment: no signs of nausea or vomiting Anesthetic complications: no    Last Vitals:  Filed Vitals:   09/09/15 0138 09/09/15 0550  BP: 98/53 108/47  Pulse: 66 65  Temp: 36.4 C 36.9 C  Resp: 18 18    Last Pain:  Filed Vitals:   09/09/15 0628  PainSc: [name]

## 2015-09-10 LAB — BASIC METABOLIC PANEL
ANION GAP: 12 (ref 5–15)
BUN: 8 mg/dL (ref 6–20)
CALCIUM: 8.4 mg/dL — AB (ref 8.9–10.3)
CHLORIDE: 100 mmol/L — AB (ref 101–111)
CO2: 26 mmol/L (ref 22–32)
CREATININE: 0.54 mg/dL (ref 0.44–1.00)
GFR calc non Af Amer: >60 mL/min (ref >60)
Glucose, Bld: 139 mg/dL — ABNORMAL HIGH (ref 65–99)
POTASSIUM: 3.7 mmol/L (ref 3.5–5.1)
Sodium: 138 mmol/L (ref 135–145)

## 2015-09-10 LAB — CBC WITH DIFFERENTIAL/PLATELET
BASOS ABS: 0 10*3/uL (ref 0.0–0.1)
BASOS PCT: 0 %
EOS PCT: 2 %
Eosinophils Absolute: 0.2 10*3/uL (ref 0.0–0.7)
HCT: 31.7 % — ABNORMAL LOW (ref 36.0–46.0)
Hemoglobin: 9.9 g/dL — ABNORMAL LOW (ref 12.0–15.0)
LYMPHS PCT: 17 %
Lymphs Abs: 1.6 10*3/uL (ref 0.7–4.0)
MCH: 29.9 pg (ref 26.0–34.0)
MCHC: 31.2 g/dL (ref 30.0–36.0)
MCV: 95.8 fL (ref 78.0–100.0)
MONO ABS: 1.1 10*3/uL — AB (ref 0.1–1.0)
Monocytes Relative: 11 %
NEUTROS ABS: 6.7 10*3/uL (ref 1.7–7.7)
Neutrophils Relative %: 70 %
PLATELETS: 132 10*3/uL — AB (ref 150–400)
RBC: 3.31 MIL/uL — AB (ref 3.87–5.11)
RDW: 14.6 % (ref 11.5–15.5)
WBC: 9.6 10*3/uL (ref 4.0–10.5)

## 2015-09-10 MED ORDER — FERROUS GLUCONATE 324 (38 FE) MG PO TABS
324.0000 mg | ORAL_TABLET | Freq: Two times a day (BID) | ORAL | Status: DC
  Administered 2015-09-10 – 2015-09-15 (×9): 324 mg via ORAL
  Filled 2015-09-10 (×11): qty 1

## 2015-09-10 NOTE — Progress Notes (Signed)
     Subjective: 2 Days Post-Op Procedure(s) (LRB): CENTRAL DECOMPRESSIVE LUMBAR LAMINECTOMIES L3-4, L4-5 AND BILATERAL HEMILAMINECTOMY L5-S1 (N/A) Awake, alert and oriented x 4. No nuchal symptoms, no HAs. Foley to SD. Tolerating diet without nausea, tolerating po narcotics. Encouraged only one more day of bedrest remaining. Patient reports pain as mild.    Objective:   VITALS:  Temp:  [97.8 F (36.6 C)-100.2 F (37.9 C)] 98 F (36.7 C) (02/16 1316) Pulse Rate:  [70-79] 70 (02/16 1316) Resp:  [18-20] 20 (02/16 1316) BP: (101-117)/(47-61) 106/52 mmHg (02/16 1316) SpO2:  [94 %-100 %] 94 % (02/16 1316)  Neurologically intact ABD soft Neurovascular intact Sensation intact distally Intact pulses distally Dorsiflexion/Plantar flexion intact Incision: scant drainage   LABS  Recent Labs  09/09/15 0520 09/10/15 0558  HGB 10.2* 9.9*  WBC 8.8 9.6  PLT 137* 132*    Recent Labs  09/09/15 0520 09/10/15 0558  NA 139 138  K 3.7 3.7  CL 102 100*  CO2 25 26  BUN 10 8  CREATININE 0.56 0.54  GLUCOSE 135* 139*   No results for input(s): LABPT, INR in the last 72 hours.   Assessment/Plan: 2 Days Post-Op Procedure(s) (LRB): CENTRAL DECOMPRESSIVE LUMBAR LAMINECTOMIES L3-4, L4-5 AND BILATERAL HEMILAMINECTOMY L5-S1 (N/A)  Advance diet D/C IV fluids Continue foley due to complete bedrest for one more day.  Encourage ROM of legs and feet. Tomorrow d/c foley, begin PT and OT  NITKA,JAMES E 09/10/2015, 1:37 PM

## 2015-09-10 NOTE — Progress Notes (Signed)
OT Cancellation Note  Patient Details Name: LASHANTA GAGNE MRN: [id number] DOB: [birth year]   Cancelled Treatment:    Reason Eval/Treat Not Completed: Medical issues which prohibited therapy (Pt continues to be on bed rest ). Please update activity orders and re-order OT when appropriate for OT evaluation.   Binnie Kand M.S., OTR/L Pager: [phone]  09/10/2015, 8:02 AM

## 2015-09-11 NOTE — Progress Notes (Signed)
OT Cancellation Note  Patient Details Name: [name] MRN: [id number] DOB: [birth year]   Cancelled Treatment:    Reason Eval/Treat Not Completed: Patient not medically ready Pt has active bedrest orders. Please D/C bedrest orders if pt ready to begin therapy. Thanks Hall, OTR/L  [phone] 09/11/2015 09/11/2015, 6:23 AM

## 2015-09-11 NOTE — Care Management Note (Signed)
Case Management Note  Patient Details  Name: [name] MRN: [id number] Date of Birth: July 17, 1946  Subjective/Objective:                    Action/Plan: Plan is for patient to discharge home with her daughter and [facility] services when she is medically ready. CM met with the patient and spoke to her daughter in the room over the phone. CM provided them a list of [place] agencies in the [facility] area. They selected [facility]. CM spoke to [facility] and faxed them the information they requested. Patient also ordered a 3 in 1 and walker. Pts daughter states she has a 3 in 1 but needs a walker. Jermaine with [facility] notified and will deliver the equipment to the room. CM continuing to follow for further d/c needs.   Expected Discharge Date:                  Expected Discharge Plan:  [place]  In-House Referral:     Discharge planning Services  CM Consult  Post Acute Care Choice:  Durable Medical Equipment, Home Health Choice offered to:  Adult Children  DME Arranged:  Walker rolling DME Agency:  [place] Arranged:  PT HH Agency:  [place]  Status of Service:  In process, will continue to follow  Medicare Important Message Given:  Yes Date Medicare IM Given:    Medicare IM give by:    Date Additional Medicare IM Given:    Additional Medicare Important Message give by:     If discussed at [place] of Stay Meetings, dates discussed:    Additional Comments:  Pollie Friar, RN 09/11/2015, 3:21 PM

## 2015-09-11 NOTE — Progress Notes (Signed)
PT Cancellation Note  Patient Details Name: [name] MRN: [id number] DOB: 1945/12/24   Cancelled Treatment:    Reason Eval/Treat Not Completed: Other (comment)   Per RN, brace is not yet here. Noted multiple bedrest orders, however within PT Consult order it states to begin ambulation Fri 09/11/15. Will follow-up once brace arrives (RN states he will call re: brace delivery)   [name] 09/11/2015, 8:36 AM  Pager [phone]

## 2015-09-11 NOTE — Progress Notes (Signed)
     Subjective: 3 Days Post-Op Procedure(s) (LRB): CENTRAL DECOMPRESSIVE LUMBAR LAMINECTOMIES L3-4, L4-5 AND BILATERAL HEMILAMINECTOMY L5-S1 (N/A) Awake, alert and oriented x 4. HOB elevated and bedrest order discontinued. Completed 3 days of bedrest now need PT/OT with expected progression to walking. May be ready for discharge in the Morning 2/18 if able to progress in PT. Dural tear was without neural  Element injury, her strength and sensory normal.    Patient reports pain as moderate.    Objective:   VITALS:  Temp:  [97.4 F (36.3 C)-98.9 F (37.2 C)] 98.9 F (37.2 C) (02/17 1514) Pulse Rate:  [68-76] 68 (02/17 1514) Resp:  [20] 20 (02/17 1514) BP: (106-118)/(47-54) 115/47 mmHg (02/17 1514) SpO2:  [94 %-100 %] 94 % (02/17 1514)  Neurologically intact ABD soft Neurovascular intact Sensation intact distally Intact pulses distally Dorsiflexion/Plantar flexion intact Incision: no drainage No cellulitis present   LABS  Recent Labs  09/09/15 0520 09/10/15 0558  HGB 10.2* 9.9*  WBC 8.8 9.6  PLT 137* 132*    Recent Labs  09/09/15 0520 09/10/15 0558  NA 139 138  K 3.7 3.7  CL 102 100*  CO2 25 26  BUN 10 8  CREATININE 0.56 0.54  GLUCOSE 135* 139*   No results for input(s): LABPT, INR in the last 72 hours.   Assessment/Plan: 3 Days Post-Op Procedure(s) (LRB): CENTRAL DECOMPRESSIVE LUMBAR LAMINECTOMIES L3-4, L4-5 AND BILATERAL HEMILAMINECTOMY L5-S1 (N/A)  Advance diet Up with therapy D/C IV fluids Plan for discharge tomorrow Discharge home with home health  PT and OT today with expected discharge goal for AM 2/18.  NITKA,JAMES E 09/11/2015, 4:06 PM

## 2015-09-11 NOTE — Care Management Important Message (Signed)
Important Message  Patient Details  Name: [name] MRN: [id number] Date of Birth: Nov 02, 1945   Medicare Important Message Given:  Yes    Barb Merino Tashari Schoenfelder 09/11/2015, 11:47 AM

## 2015-09-11 NOTE — Progress Notes (Signed)
Back brace brought to pt and fitted. Offered toileting to pt, assisted to bedside commode without complication. Pt requested morphine for pain, grandaughters in room, advocating against further pain control. Asked pt to rate pain, 10/10. Morphine given per order, with good result. Sitting up in chair, pillows behind back, brace on. No acute distress.

## 2015-09-11 NOTE — Progress Notes (Signed)
PT Cancellation Note  Patient Details Name: [name] MRN: [id number] DOB: [birth year]   Cancelled Treatment:    Reason Eval/Treat Not Completed: Other (comment)  Checked back several times and pt continues to not have her brace yet delivered. Called Dr. Otho Ket office and left message to ask if pt can get up without her brace (bed to chair). No return call or new orders yet.   Alejandro Adcox 09/11/2015, 1:51 PM  Pager [phone]

## 2015-09-11 NOTE — Progress Notes (Signed)
Pt refused to D/C foley. Pt states waiting on daughter at around [place] so can use the bathroom. Encouraged and explains that staff can assist pt to use toileting equipment as needed if unsteady with gait and can not make it to the bathroom.

## 2015-09-12 ENCOUNTER — Inpatient Hospital Stay (HOSPITAL_COMMUNITY): Payer: PPO

## 2015-09-12 DIAGNOSIS — M5136 Other intervertebral disc degeneration, lumbar region: Secondary | ICD-10-CM | POA: Diagnosis not present

## 2015-09-12 DIAGNOSIS — J4520 Mild intermittent asthma, uncomplicated: Secondary | ICD-10-CM | POA: Diagnosis not present

## 2015-09-12 MED ORDER — HYDROCODONE-ACETAMINOPHEN 10-325 MG PO TABS
1.0000 | ORAL_TABLET | ORAL | Status: DC | PRN
  Administered 2015-09-12 (×3): 1 via ORAL
  Administered 2015-09-13: 2 via ORAL
  Filled 2015-09-12: qty 1
  Filled 2015-09-12: qty 2
  Filled 2015-09-12 (×2): qty 1

## 2015-09-12 MED ORDER — METHOCARBAMOL 500 MG PO TABS
500.0000 mg | ORAL_TABLET | Freq: Four times a day (QID) | ORAL | Status: DC | PRN
  Administered 2015-09-12: 500 mg via ORAL
  Filled 2015-09-12: qty 1

## 2015-09-12 MED ORDER — FENTANYL 25 MCG/HR TD PT72
25.0000 ug | MEDICATED_PATCH | TRANSDERMAL | Status: DC
  Administered 2015-09-12: 25 ug via TRANSDERMAL
  Filled 2015-09-12: qty 1

## 2015-09-12 MED ORDER — MAGNESIUM HYDROXIDE 400 MG/5ML PO SUSP: 30.0000 mL | Freq: Every day | ORAL | Status: DC | PRN

## 2015-09-12 MED ORDER — DIAZEPAM 2 MG PO TABS: 2.0000 mg | ORAL_TABLET | Freq: Four times a day (QID) | ORAL | Status: DC | PRN

## 2015-09-12 MED ORDER — GABAPENTIN 600 MG PO TABS
600.0000 mg | ORAL_TABLET | Freq: Three times a day (TID) | ORAL | Status: DC
  Administered 2015-09-12 – 2015-09-15 (×9): 600 mg via ORAL
  Filled 2015-09-12 (×10): qty 1

## 2015-09-12 MED ORDER — METHOCARBAMOL 1000 MG/10ML IJ SOLN
500.0000 mg | Freq: Four times a day (QID) | INTRAVENOUS | Status: DC | PRN
  Filled 2015-09-12: qty 5

## 2015-09-12 NOTE — Progress Notes (Signed)
OT Cancellation Note  Patient Details Name: [name] MRN: [id number] DOB: Feb 02, 1946   Cancelled Treatment:    Reason Eval/Treat Not Completed: Pain limiting ability to participate - attempted to see x 2.  Pt with increased pain.  Will reattempt.    Darlina Rumpf [place], OTR/L [id number]  09/12/2015, 1:12 PM

## 2015-09-12 NOTE — Progress Notes (Signed)
Physical Therapy Evaluation Patient Details Name: [name] MRN: [id number] DOB: Dec 07, 1945 Today's Date: 09/12/2015   History of Present Illness  70 y.o. female s/p CENTRAL DECOMPRESSIVE LUMBAR LAMINECTOMIES L3-4, L4-5 AND BILATERAL HEMILAMINECTOMY L5-S1.  Clinical Impression  Patient is seen following the above procedure, presents with functional limitations due to the deficits listed below (see PT Problem List). Ambulating up to 65 feet with supervision. Min assist for bed mobility; family was present and states they can assist as needed and they have no further mobility concerns at this time. Educated on precautions and exercises with verbalized understanding at end of session. Mild reliance on RW for support with ambulation. Will follow until d/c.      Follow Up Recommendations Supervision for mobility/OOB;No PT follow up    Equipment Recommendations  Rolling walker with 5" wheels    Recommendations for Other Services       Precautions / Restrictions Precautions Precautions: Back Precaution Booklet Issued: Yes (comment) Precaution Comments: reviewed precautions- able to verbalize accurately by end of session Required Braces or Orthoses:  (back brace ordered, wrong one delivered, RN reports No brace) Restrictions Weight Bearing Restrictions: No      Mobility  Bed Mobility Overal bed mobility: Needs Assistance Bed Mobility: Rolling;Sidelying to Sit Rolling: Supervision Sidelying to sit: Min assist       General bed mobility comments: Min assist to bring LEs off of bed and gently guide trunk into seated position. Family present and observed stating they understand and feel confident with their ability to assist pt in this manner.  Transfers Overall transfer level: Needs assistance Equipment used: Rolling walker (2 wheeled) Transfers: Sit to/from Stand Sit to Stand: From elevated surface;Min guard         General transfer comment: Min guard for safety. Bed  slightly elevated to simulate pts bed at home. Performed without physical assist. Slow to rise.  Ambulation/Gait Ambulation/Gait assistance: Supervision Ambulation Distance (Feet): 65 Feet Assistive device: Rolling walker (2 wheeled) Gait Pattern/deviations: Step-through pattern;Decreased stride length;Trunk flexed Gait velocity: decreased Gait velocity interpretation: Below normal speed for age/gender General Gait Details: Small steps, cues for larger stride length and upright posture. Walker adjusted for appropriate height. No overt loss of balance noted, no buckling. Slow but stable with RW for support.  Stairs            Wheelchair Mobility    Modified Rankin (Stroke Patients Only)       Balance Overall balance assessment: Needs assistance Sitting-balance support: No upper extremity supported;Feet supported Sitting balance-Leahy Scale: Good     Standing balance support: No upper extremity supported Standing balance-Leahy Scale: Fair                               Pertinent Vitals/Pain Pain Assessment: 0-10 Pain Score: 8  Pain Location: back and anterior thighs Pain Descriptors / Indicators: Aching Pain Intervention(s): Monitored during session;Repositioned    Home Living Family/patient expects to be discharged to:: Private residence Living Arrangements: Children Available Help at Discharge: Family;Available 24 hours/day Type of Home: House Home Access: Level entry     Home Layout: One level Home Equipment: Shower seat;Bedside commode      Prior Function Level of Independence: Independent               Hand Dominance   Dominant Hand: Right    Extremity/Trunk Assessment   Upper Extremity Assessment: Defer to OT evaluation  Lower Extremity Assessment: Generalized weakness         Communication   Communication: No difficulties  Cognition Arousal/Alertness: Awake/alert Behavior During Therapy: WFL for tasks  assessed/performed Overall Cognitive Status: Within Functional Limits for tasks assessed                      General Comments General comments (skin integrity, edema, etc.): Family present during evaluation. No further questions or concerns. Educated on importance of frequently changing position and being OOB as tolerated.  SpO2 96%    Exercises General Exercises - Lower Extremity Long Arc Quad: Strengthening;Both;5 reps;Seated (alternating)      Assessment/Plan    PT Assessment Patient needs continued PT services  PT Diagnosis Difficulty walking;Abnormality of gait;Generalized weakness;Acute pain   PT Problem List Decreased strength;Decreased activity tolerance;Decreased balance;Decreased mobility;Pain  PT Treatment Interventions DME instruction;Gait training;Functional mobility training;Therapeutic activities;Therapeutic exercise;Balance training;Patient/family education;Modalities   PT Goals (Current goals can be found in the Care Plan section) Acute Rehab PT Goals Patient Stated Goal: No pain PT Goal Formulation: With patient/family Time For Goal Achievement: 09/26/15 Potential to Achieve Goals: Good    Frequency Min 5X/week   Barriers to discharge        Co-evaluation               End of Session Equipment Utilized During Treatment: Gait belt (RN reports back brace was D/c) Activity Tolerance: Patient tolerated treatment well Patient left: in chair;with call bell/phone within reach;with family/visitor present Nurse Communication: Mobility status;Precautions         Time: 0820-0904 PT Time Calculation (min) (ACUTE ONLY): 44 min   Charges:   PT Evaluation $PT Eval Low Complexity: 1 Procedure PT Treatments $Gait Training: 8-22 mins $Therapeutic Activity: 8-22 mins   PT G CodesEllouise Newer 09/12/2015, 9:24 AM Elayne Snare, [place]

## 2015-09-12 NOTE — Progress Notes (Signed)
Subjective: Pt stable - reports pain with moving around - no headaches   Objective: Vital signs in last 24 hours: Temp:  [97.4 F (36.3 C)-98.9 F (37.2 C)] 97.9 F (36.6 C) (02/18 0146) Pulse Rate:  [68-74] 72 (02/18 0146) Resp:  [20] 20 (02/18 0146) BP: (100-117)/(47-53) 116/52 mmHg (02/18 0146) SpO2:  [91 %-100 %] 99 % (02/18 0903)  Intake/Output from previous day:   Intake/Output this shift:    Exam:  Dorsiflexion/Plantar flexion intact  Labs:  Recent Labs  09/10/15 0558  HGB 9.9*    Recent Labs  09/10/15 0558  WBC 9.6  RBC 3.31*  HCT 31.7*  PLT 132*    Recent Labs  09/10/15 0558  NA 138  K 3.7  CL 100*  CO2 26  BUN 8  CREATININE 0.54  GLUCOSE 139*  CALCIUM 8.4*   No results for input(s): LABPT, INR in the last 72 hours.  Assessment/Plan: Possible dc today - did ok with PT   Mertis Mosher SCOTT 09/12/2015, 10:33 AM

## 2015-09-13 DIAGNOSIS — R269 Unspecified abnormalities of gait and mobility: Secondary | ICD-10-CM | POA: Diagnosis not present

## 2015-09-13 LAB — CBC WITH DIFFERENTIAL/PLATELET
BASOS ABS: 0 10*3/uL (ref 0.0–0.1)
BASOS PCT: 0 %
EOS ABS: 0.3 10*3/uL (ref 0.0–0.7)
EOS PCT: 3 %
HCT: 34.1 % — ABNORMAL LOW (ref 36.0–46.0)
Hemoglobin: 11 g/dL — ABNORMAL LOW (ref 12.0–15.0)
LYMPHS PCT: 25 %
Lymphs Abs: 2.1 10*3/uL (ref 0.7–4.0)
MCH: 30.4 pg (ref 26.0–34.0)
MCHC: 32.3 g/dL (ref 30.0–36.0)
MCV: 94.2 fL (ref 78.0–100.0)
MONO ABS: 1 10*3/uL (ref 0.1–1.0)
Monocytes Relative: 12 %
Neutro Abs: 4.9 10*3/uL (ref 1.7–7.7)
Neutrophils Relative %: 60 %
PLATELETS: 241 10*3/uL (ref 150–400)
RBC: 3.62 MIL/uL — ABNORMAL LOW (ref 3.87–5.11)
RDW: 14.5 % (ref 11.5–15.5)
WBC: 8.3 10*3/uL (ref 4.0–10.5)

## 2015-09-13 LAB — COMPREHENSIVE METABOLIC PANEL
ALBUMIN: 3.3 g/dL — AB (ref 3.5–5.0)
ALK PHOS: 43 U/L (ref 38–126)
ALT: 23 U/L (ref 14–54)
AST: 29 U/L (ref 15–41)
Anion gap: 13 (ref 5–15)
BILIRUBIN TOTAL: 0.6 mg/dL (ref 0.3–1.2)
BUN: 14 mg/dL (ref 6–20)
CALCIUM: 9.4 mg/dL (ref 8.9–10.3)
CO2: 29 mmol/L (ref 22–32)
CREATININE: 0.77 mg/dL (ref 0.44–1.00)
Chloride: 96 mmol/L — ABNORMAL LOW (ref 101–111)
GFR calc Af Amer: >60 mL/min (ref >60)
GLUCOSE: 144 mg/dL — AB (ref 65–99)
Potassium: 3.4 mmol/L — ABNORMAL LOW (ref 3.5–5.1)
Sodium: 138 mmol/L (ref 135–145)
TOTAL PROTEIN: 6.9 g/dL (ref 6.5–8.1)

## 2015-09-13 MED ORDER — PREDNISONE 10 MG (21) PO TBPK
20.0000 mg | ORAL_TABLET | Freq: Every morning | ORAL | Status: AC
  Administered 2015-09-13: 20 mg via ORAL
  Filled 2015-09-13: qty 21

## 2015-09-13 MED ORDER — POTASSIUM CITRATE ER 10 MEQ (1080 MG) PO TBCR
10.0000 meq | EXTENDED_RELEASE_TABLET | Freq: Two times a day (BID) | ORAL | Status: DC
  Administered 2015-09-13 – 2015-09-15 (×4): 10 meq via ORAL
  Filled 2015-09-13 (×4): qty 1

## 2015-09-13 MED ORDER — PREDNISONE 10 MG (21) PO TBPK
10.0000 mg | ORAL_TABLET | ORAL | Status: AC
  Administered 2015-09-13: 10 mg via ORAL

## 2015-09-13 MED ORDER — PREDNISONE 10 MG (21) PO TBPK
10.0000 mg | ORAL_TABLET | Freq: Three times a day (TID) | ORAL | Status: AC
  Administered 2015-09-14 (×3): 10 mg via ORAL

## 2015-09-13 MED ORDER — BACLOFEN 10 MG PO TABS
10.0000 mg | ORAL_TABLET | Freq: Two times a day (BID) | ORAL | Status: DC
  Administered 2015-09-13 – 2015-09-15 (×5): 10 mg via ORAL
  Filled 2015-09-13 (×5): qty 1

## 2015-09-13 MED ORDER — PREDNISONE 10 MG (21) PO TBPK
10.0000 mg | ORAL_TABLET | Freq: Four times a day (QID) | ORAL | Status: DC
  Administered 2015-09-15: 10 mg via ORAL

## 2015-09-13 MED ORDER — PREDNISONE 10 MG (21) PO TBPK
20.0000 mg | ORAL_TABLET | Freq: Every evening | ORAL | Status: AC
  Administered 2015-09-13: 20 mg via ORAL

## 2015-09-13 MED ORDER — PREDNISONE 10 MG (21) PO TBPK
20.0000 mg | ORAL_TABLET | Freq: Every evening | ORAL | Status: AC
  Administered 2015-09-14: 20 mg via ORAL

## 2015-09-13 MED ORDER — HYDROCODONE-ACETAMINOPHEN 5-325 MG PO TABS
1.0000 | ORAL_TABLET | ORAL | Status: DC | PRN
  Administered 2015-09-13 – 2015-09-14 (×5): 1 via ORAL
  Filled 2015-09-13 (×5): qty 1

## 2015-09-13 NOTE — Progress Notes (Signed)
Physical Therapy Treatment Patient Details Name: CORETHA MELERINE MRN: [id number] DOB: October 12, 1945 Today's Date: 09/13/2015    History of Present Illness 70 y.o. female s/p CENTRAL DECOMPRESSIVE LUMBAR LAMINECTOMIES L3-4, L4-5 AND BILATERAL HEMILAMINECTOMY L5-S1.    PT Comments    Mobilizing safely at a min guard level, although very guarded and complaining of pain despite being pre-medicated. Declines to ambulate further distance today and states she cannot tolerate exercise due to spasms. Requires encouragement to participate minimally. No buckling or overt loss of balance noted with short distance gait training today, using RW for light support. Will continue to follow and progress until d/c.  Follow Up Recommendations  Supervision for mobility/OOB;No PT follow up (If pt still limited tomorrow may consider HHPT as she is not motivated to mobilize much.)     Equipment Recommendations  Rolling walker with 5" wheels    Recommendations for Other Services       Precautions / Restrictions Precautions Precautions: Back Precaution Booklet Issued: No Precaution Comments: recalls 3/3 precautions Required Braces or Orthoses: Spinal Brace Spinal Brace: Lumbar corset Restrictions Weight Bearing Restrictions: No    Mobility  Bed Mobility Overal bed mobility: Needs Assistance Bed Mobility: Rolling;Sit to Sidelying Rolling: Supervision Sidelying to sit: Min assist     Sit to sidelying: Min assist General bed mobility comments: Min assist for LE support only. Frequent VC for technique with log roll. Family present and observed  Transfers Overall transfer level: Needs assistance Equipment used: Rolling walker (2 wheeled) Transfers: Sit to/from Stand Sit to Stand: Min guard         General transfer comment: Practiced x2 from recliner. Close guard for safety. Cues for upright posture to prevent truncal flexion.   Ambulation/Gait Ambulation/Gait assistance: Supervision Ambulation  Distance (Feet): 25 Feet Assistive device: Rolling walker (2 wheeled) Gait Pattern/deviations: Step-through pattern;Decreased stride length;Shuffle;Trunk flexed Gait velocity: decreased Gait velocity interpretation: Below normal speed for age/gender General Gait Details: VC for upright posture and larger stride. No buckling noted. Very guarded, takes several standing rest breaks. Feels she cannot continue further distance due to fatigue despite encouragement. No overt loss of balance or buckling noted.   Stairs            Wheelchair Mobility    Modified Rankin (Stroke Patients Only)       Balance Overall balance assessment: Needs assistance Sitting-balance support: No upper extremity supported;Feet supported Sitting balance-Leahy Scale: Fair Sitting balance - Comments: Prefers bilateral UE support on seated surface due to pain   Standing balance support: No upper extremity supported;During functional activity Standing balance-Leahy Scale: Poor Standing balance comment: Able to maintain balance for a few seconds during grooming tasks at sink. Bilateral LE shakiness and pain preventing pt from being able to maintain balance without UE support                    Cognition Arousal/Alertness: Awake/alert Behavior During Therapy: WFL for tasks assessed/performed Overall Cognitive Status: Within Functional Limits for tasks assessed                      Exercises      General Comments        Pertinent Vitals/Pain Pain Assessment: 0-10 Pain Score: 8  Pain Location: back, posterior thighs Pain Descriptors / Indicators: Spasm Pain Intervention(s): Monitored during session;Repositioned;Premedicated before session;Limited activity within patient's tolerance    Home Living Family/patient expects to be discharged to:: Private residence Living Arrangements: Children Available Help at  Discharge: Family;Available 24 hours/day Type of Home: House Home Access:  Level entry   Home Layout: One level Home Equipment: Shower seat;Bedside commode;Grab bars - toilet;Grab bars - tub/shower;Adaptive equipment;Walker - 2 wheels;Cane - single point      Prior Function Level of Independence: Independent          PT Goals (current goals can now be found in the care plan section) Acute Rehab PT Goals Patient Stated Goal: No pain PT Goal Formulation: With patient/family Time For Goal Achievement: 09/26/15 Potential to Achieve Goals: Good Progress towards PT goals: Progressing toward goals    Frequency  Min 5X/week    PT Plan Current plan remains appropriate    Co-evaluation             End of Session Equipment Utilized During Treatment: Gait belt;Back brace Activity Tolerance: Patient tolerated treatment well Patient left: with call bell/phone within reach;with family/visitor present;in bed;with bed alarm set     Time: [id number] PT Time Calculation (min) (ACUTE ONLY): 28 min  Charges:  $Gait Training: 8-22 mins $Therapeutic Activity: 8-22 mins                    G Codes:      Ellouise Newer 2015/10/06, 11:40 AM  Elayne Snare, [place]

## 2015-09-13 NOTE — Progress Notes (Signed)
Subjective: Pt stable but feels shaky this am - has been in chair   Objective: Vital signs in last 24 hours: Temp:  [98.1 F (36.7 C)-98.5 F (36.9 C)] 98.3 F (36.8 C) (02/19 0938) Pulse Rate:  [71-88] 86 (02/19 1045) Resp:  [15-18] 18 (02/19 1045) BP: (113-146)/(58-87) 127/80 mmHg (02/19 0938) SpO2:  [92 %-98 %] 93 % (02/19 0938)  Intake/Output from previous day: 02/18 0701 - 02/19 0700 In: 360 [P.O.:360] Out: -  Intake/Output this shift:    Exam:  Dorsiflexion/Plantar flexion intact  Labs: No results for input(s): HGB in the last 72 hours. No results for input(s): WBC, RBC, HCT, PLT in the last 72 hours. No results for input(s): NA, K, CL, CO2, BUN, CREATININE, GLUCOSE, CALCIUM in the last 72 hours. No results for input(s): LABPT, INR in the last 72 hours.  Assessment/Plan: She may need snf - will decrease to norco 5/325 - Dr Louanne Skye to eval in am   [facility] 09/13/2015, 11:23 AM

## 2015-09-13 NOTE — Progress Notes (Signed)
Still having spasms. When she stands and walks. Has Rheumatoid arthritis and is currently on plaquenil and weekly methotrexate. Both are  Being held. Indicates that she is not able to sleep well, some visual  Hallucinations, she now is on vicodin with a fentanyl patch 50MCG/HR She is receiving lasix daily but she states that she normally takes only  Prn. No potassium supplements mainly becauseshe did not list a dose For potassium that she takes BID.She states 99mg  per dose. She has been on prednisone in the past for a long period due to the RA. No BM since prior to hospitalization. Discussed with her that PT is going slow and though she looks younger She is 70 years old and it may take longer than expected to get over this surgery. I think we should look into a short term SNF placement. She has been to [facility] twice in the past and has a sister who has been to [facility]. I told her that we need To start looking into this is she is unable to walk further or demonstrate more independence here. Will obtain Social Service consult for SNF placement. Place her on a sterapred dose pack. Try baclofen as the order for valium was discontinued yesterday as she Apparently has reaction to this medication. Restart O2 at night and during the day when she is trying to sleep as she has sleep apnea diagnosis and asthma. Restart potassium replacement, checking lab today.

## 2015-09-13 NOTE — Care Management Note (Signed)
Case Management Note  Patient Details  Name: SAMNANG PILI MRN: [id number] Date of Birth: [birth year]  Subjective/Objective:                  Low back pain Action/Plan: Discharge planning Expected Discharge Date:  09/13/15               Expected Discharge Plan:  Home/Self Care  In-House Referral:     Discharge planning Services  CM Consult  Post Acute Care Choice:  Durable Medical Equipment Choice offered to:  Adult Children  DME Arranged:  Walker rolling, 3-N-1 DME Agency:  [place]:    [place] Agency:     Status of Service:  Completed, signed off  Medicare Important Message Given:  Yes Date Medicare IM Given:    Medicare IM give by:    Date Additional Medicare IM Given:    Additional Medicare Important Message give by:     If discussed at [place] of Stay Meetings, dates discussed:    Additional Comments: Cm received call from RN requesting delivery of DME.  No HHPT/OT recc or ordered.  CM called [facility] DME rep, Merry Proud to please deliver the 3n1 and rolling walker to room prior to discharge.  No other Cm needs were communicated. Dellie Catholic, RN 09/13/2015, 11:04 AM

## 2015-09-13 NOTE — Progress Notes (Signed)
Occupational Therapy Evaluation Patient Details Name: [name] MRN: [id number] DOB: 1946/05/11 Today's Date: 09/13/2015    History of Present Illness 70 y.o. female s/p CENTRAL DECOMPRESSIVE LUMBAR LAMINECTOMIES L3-4, L4-5 AND BILATERAL HEMILAMINECTOMY L5-S1.   Clinical Impression   PTA, pt was independent with ADLs and mobility. Pt limited this session by pain and bilateral LE weakness/shakiness. Pt currently requires min guard-min assist for functional transfers and ambulation due to balance and min-mod assist for ADLs due to back precautions. Began education on back precautions, brace wear protocol, and compensatory strategies for ADLs. Encouraged pt to sit up in chair for no longer than 30 minutes-1 hour this morning and RN notified. Pt plans to d/c home with 24/7 assistance from daughters. No OT follow up or DME recommended at this time.    Follow Up Recommendations  No OT follow up;Supervision/Assistance - 24 hour    Equipment Recommendations  Other (comment) (Adaptive equipment: toilet aid - pt to purchase)    Recommendations for Other Services       Precautions / Restrictions Precautions Precautions: Back Precaution Booklet Issued: No Precaution Comments: Pt able to accurately recall 3/3 back precautions, but required mod verbal cues to adhere to them during activity Required Braces or Orthoses: Spinal Brace Spinal Brace: Lumbar corset;Applied in sitting position Restrictions Weight Bearing Restrictions: No      Mobility Bed Mobility Overal bed mobility: Needs Assistance Bed Mobility: Rolling;Sidelying to Sit Rolling: Min guard Sidelying to sit: Min assist       General bed mobility comments: Min assist for trunk to support to come to sitting position. Verbal cues for log roll technique - pt uanble to recall this from PT session yesterday.  Transfers Overall transfer level: Needs assistance Equipment used: Rolling walker (2 wheeled) Transfers: Sit to/from  Stand Sit to Stand: Min guard         General transfer comment: Min guard for safety. Verbal cues for safe hand placement on seated surface and to avoid bending foward excessively.    Balance Overall balance assessment: Needs assistance Sitting-balance support: No upper extremity supported;Feet supported Sitting balance-Leahy Scale: Fair Sitting balance - Comments: Prefers bilateral UE support on seated surface due to pain   Standing balance support: No upper extremity supported;During functional activity Standing balance-Leahy Scale: Poor Standing balance comment: Able to maintain balance for a few seconds during grooming tasks at sink. Bilateral LE shakiness and pain preventing pt from being able to maintain balance without UE support                            ADL Overall ADL's : Needs assistance/impaired     Grooming: Wash/dry hands;Min guard;Standing           Upper Body Dressing : Minimal assistance;Sitting Upper Body Dressing Details (indicate cue type and reason): Due to pain Lower Body Dressing: Moderate assistance;Cueing for back precautions;Sit to/from stand Lower Body Dressing Details (indicate cue type and reason): Assist with socks and shoes Toilet Transfer: Min guard;Ambulation;BSC;RW;Cueing for safety Toilet Transfer Details (indicate cue type and reason): BSC over toilet, cues to feel BSC on back of legs before reaching back to sit  Toileting- Clothing Manipulation and Hygiene: Maximal assistance;Cueing for compensatory techniques;Cueing for back precautions;Sit to/from stand Toileting - Clothing Manipulation Details (indicate cue type and reason): Educated and demonstrated use of toilet aid - max assist this session due to pain and bilateral LE shakiness     Functional mobility during  ADLs: Min guard;Rolling walker General ADL Comments: Began education on adhering to back precautions during ADLs, brace wear protocol, and some compensatory  strategies for ADLs. Pt's daughter present for OT eval.     Vision Vision Assessment?: No apparent visual deficits   Perception     Praxis      Pertinent Vitals/Pain Pain Assessment: 0-10 Pain Score: 8  Pain Location: back and buttocks Pain Descriptors / Indicators: Aching;Sore;Shooting Pain Intervention(s): Limited activity within patient's tolerance;Monitored during session;Premedicated before session;Repositioned     Hand Dominance Right   Extremity/Trunk Assessment Upper Extremity Assessment Upper Extremity Assessment: Generalized weakness (Rheumatoid arthritis in hands)   Lower Extremity Assessment Lower Extremity Assessment: Generalized weakness   Cervical / Trunk Assessment Cervical / Trunk Assessment: Other exceptions Cervical / Trunk Exceptions: s/p lumbar surgery   Communication Communication Communication: No difficulties   Cognition Arousal/Alertness: Awake/alert Behavior During Therapy: WFL for tasks assessed/performed Overall Cognitive Status: Within Functional Limits for tasks assessed                     General Comments       Exercises       Shoulder Instructions      Home Living Family/patient expects to be discharged to:: Private residence Living Arrangements: Children Available Help at Discharge: Family;Available 24 hours/day Type of Home: House Home Access: Level entry     Home Layout: One level     Bathroom Shower/Tub: Walk-in Hydrologist: Handicapped height Bathroom Accessibility: Yes How Accessible: Accessible via walker;Accessible via wheelchair Home Equipment: Shower seat;Bedside commode;Grab bars - toilet;Grab bars - tub/shower;Adaptive equipment;Walker - 2 wheels;Cane - single point Adaptive Equipment: Reacher        Prior Functioning/Environment Level of Independence: Independent             OT Diagnosis: Generalized weakness;Acute pain   OT Problem List: Decreased  strength;Decreased range of motion;Decreased activity tolerance;Impaired balance (sitting and/or standing);Decreased coordination;Decreased safety awareness;Decreased knowledge of use of DME or AE;Decreased knowledge of precautions;Obesity;Pain   OT Treatment/Interventions: Self-care/ADL training;Therapeutic exercise;Energy conservation;DME and/or AE instruction;Therapeutic activities;Patient/family education;Balance training    OT Goals(Current goals can be found in the care plan section) Acute Rehab OT Goals Patient Stated Goal: to decrease pain OT Goal Formulation: With patient Time For Goal Achievement: 09/27/15 Potential to Achieve Goals: Good ADL Goals Pt Will Perform Lower Body Bathing: with supervision;with adaptive equipment;sitting/lateral leans;sit to/from stand Pt Will Perform Lower Body Dressing: with supervision;with adaptive equipment;sitting/lateral leans;sit to/from stand Pt Will Transfer to Toilet: with supervision;ambulating;grab bars (comfort height toilet) Pt Will Perform Toileting - Clothing Manipulation and hygiene: with supervision;with adaptive equipment;sitting/lateral leans;sit to/from stand Pt Will Perform Tub/Shower Transfer: Shower transfer;with supervision;ambulating;shower seat;rolling walker;grab bars Additional ADL Goal #1: Pt/caregiver will independently don/doff back brace to increase independence with ADLs and mobility. Additional ADL Goal #2: Pt will demonstrate adherence to 3/3 back precautions with no verbal cues to increase safety with ADLs and mobility.  OT Frequency: Min 3X/week   Barriers to D/C:            Co-evaluation              End of Session Equipment Utilized During Treatment: Gait belt;Rolling walker;Back brace Nurse Communication: Mobility status;Other (comment) (Return pt to bed in 1 hour; pt requesting bath)  Activity Tolerance: Patient limited by pain Patient left: in chair;with call bell/phone within reach;with chair alarm  set;with family/visitor present   Time: [id number] OT Time Calculation (min): 42 min  Charges:  OT General Charges $OT Visit: 1 Procedure OT Evaluation $OT Eval Moderate Complexity: 1 Procedure OT Treatments $Self Care/Home Management : 23-37 mins G-Codes:    Redmond Baseman, OTR/L Pager: [phone] 09/13/2015, 10:26 AM

## 2015-09-14 MED ORDER — HYDROXYCHLOROQUINE SULFATE 200 MG PO TABS
200.0000 mg | ORAL_TABLET | Freq: Once | ORAL | Status: AC
Start: 2015-09-14 — End: 2015-09-14
  Administered 2015-09-14: 200 mg via ORAL
  Filled 2015-09-14: qty 1

## 2015-09-14 NOTE — Progress Notes (Signed)
Pt in chair with brace at present and appears more comfortable.  She is smiling and states she feels better.  She did well with PT today.

## 2015-09-14 NOTE — Care Management Note (Signed)
Case Management Note  Patient Details  Name: [name] MRN: [id number] Date of Birth: [birth year]  Subjective/Objective:                    Action/Plan: Plan is to discharge home with [facility] services. Patient set up with [facility]. Patient having some confusion and hallucinations from possible pain medication. Dr Louanne Skye informed and holding discharge until tomorrow. Patient already has walker for home in her room. CM will follow up tomorrow for any further d/c needs.  Expected Discharge Date:                  Expected Discharge Plan:  Home/Self Care  In-House Referral:     Discharge planning Services  CM Consult  Post Acute Care Choice:  Durable Medical Equipment Choice offered to:  Adult Children  DME Arranged:  Walker rolling, 3-N-1 DME Agency:  [place]:    [place] Agency:     Status of Service:  Completed, signed off  Medicare Important Message Given:  Yes Date Medicare IM Given:    Medicare IM give by:    Date Additional Medicare IM Given:    Additional Medicare Important Message give by:     If discussed at [place] of Stay Meetings, dates discussed:    Additional Comments:  Pollie Friar, RN 09/14/2015, 2:48 PM

## 2015-09-14 NOTE — Progress Notes (Signed)
Occupational Therapy Treatment Patient Details Name: [name] MRN: [id number] DOB: [birth year] Today's Date: 09/14/2015    History of present illness 70 y.o. female s/p CENTRAL DECOMPRESSIVE LUMBAR LAMINECTOMIES L3-4, L4-5 AND BILATERAL HEMILAMINECTOMY L5-S1.   OT comments  Patient progressing slowly, but steadily towards OT goals. Continue plan of care for now. Pt will need 24/7 supervision/assistance at home due to generalized weakness and increased pain/spasms with mobility. Pt unable to practice walk-in shower transfer today due to pain. At this time, recommending sponge baths to begin with. Discussed walk-in shower transfer technique, anterior posterior VS using tub transfer bench with two legs in shower and two legs outside of shower. Would like to practice this with patient tomorrow.    Follow Up Recommendations  No OT follow up;Supervision/Assistance - 24 hour    Equipment Recommendations  Other (comment) (AE - LH sponge and toileting aide, pt refused sock aid )    Recommendations for Other Services  None at this time   Precautions / Restrictions Precautions Precautions: Back Precaution Comments: recalls 3/3 precautions, needs verbal cues to adhere to them functionally  Required Braces or Orthoses: Spinal Brace Spinal Brace: Lumbar corset Restrictions Weight Bearing Restrictions: No    Mobility Bed Mobility Overal bed mobility: Needs Assistance Bed Mobility: Rolling;Sit to Sidelying Rolling: Min assist       Sit to sidelying: Mod assist General bed mobility comments: Assistance needed for management of BLEs for sit to sidelying. Pt required multimodal cueing to adhere to back precautions during bed mobility  Transfers Overall transfer level: Needs assistance Equipment used: Rolling walker (2 wheeled) Transfers: Sit to/from Stand Sit to Stand: Min guard General transfer comment: Min guard for safety. Pt with spasms in back during sit to stands.    Balance  Overall balance assessment: Needs assistance Sitting-balance support: No upper extremity supported;Feet supported Sitting balance-Leahy Scale: Fair     Standing balance support: Bilateral upper extremity supported;During functional activity Standing balance-Leahy Scale: Fair Standing balance comment: heavy reliance on RW   ADL Overall ADL's : Needs assistance/impaired Eating/Feeding: Set up;Sitting   Grooming: Min guard;Standing   Upper Body Bathing: Minimal assitance;Sitting   Lower Body Bathing: Moderate assistance;Sit to/from stand   Upper Body Dressing : Minimal assistance;Sitting Upper Body Dressing Details (indicate cue type and reason): Due to pain Lower Body Dressing: Moderate assistance;Cueing for back precautions;Sit to/from stand   Toilet Transfer: Min guard;Ambulation;BSC;RW;Cueing for safety Toilet Transfer Details (indicate cue type and reason): BSC over toilet Tub/Shower Transfer Details (indicate cue type and reason): Pt unable to practice this transfer secondary to increased pain and spasms in back, would like to practice this tomorrow  Functional mobility during ADLs: Min guard;Rolling walker General ADL Comments: Pt and pt's daughter present. Pt practiced doffing and donning lumbar corset independently. Therapist then started discussing use of AE for LB ADLs. Discussed use of reacher, sock aid, LH sponge. Pt stated she didn't want to practice with a reacher because "my grandchildren will help me put on my socks". Encouraged pt to use a reacher and LH sponge for safety with B&D to avoid breaking back precautions.  Pt with spasms in back during sit to stand transfers. Pt ambulated into BR and transferred onto BSC seated over toilet seat. Pt then attempted to perform shower stall transfer, but pt stated she was unable due to back spasms. Pt ambulated back to EOB to lie back down. Mulimodal cueing needed for correct mechanics for sit to sidelying to supine.  Cognition    Behavior During Therapy: WFL for tasks assessed/performed;Flat affect Overall Cognitive Status: Within Functional Limits for tasks assessed                 Pertinent Vitals/ Pain       Pain Assessment: Faces Faces Pain Scale: Hurts whole lot Pain Location: back Pain Descriptors / Indicators: Spasm Pain Intervention(s): Limited activity within patient's tolerance;Monitored during session;Repositioned   Frequency Min 3X/week     Progress Toward Goals  OT Goals(current goals can now befound in the care plan section)  Progress towards OT goals: Progressing toward goals  Acute Rehab OT Goals Patient Stated Goal: No pain OT Goal Formulation: With patient/family (daughter, Lattie Haw) Time For Goal Achievement: 09/27/15 Potential to Achieve Goals: Good  Plan Discharge plan remains appropriate       End of Session Equipment Utilized During Treatment: Rolling walker;Back brace   Activity Tolerance Patient limited by pain   Patient Left in bed;with call bell/phone within reach;with family/visitor present    Time: [id number] OT Time Calculation (min): 29 min  Charges: OT General Charges $OT Visit: 1 Procedure OT Treatments $Self Care/Home Management : 23-37 mins  Chrys Racer , MS, OTR/L, CLT Pager: [phone]  09/14/2015, 11:13 AM

## 2015-09-14 NOTE — Progress Notes (Signed)
Physical Therapy Treatment Patient Details Name: [name] MRN: [id number] DOB: May 21, 1946 Today's Date: 09/14/2015    History of Present Illness 70 y.o. female s/p CENTRAL DECOMPRESSIVE LUMBAR LAMINECTOMIES L3-4, L4-5 AND BILATERAL HEMILAMINECTOMY L5-S1.    PT Comments    Patient able to improve her mobility to day to ambulating 65 feet with rw and min guard assist. Daughter present throughout session. Discussion with patient and family reveals a excellent support at home as well as a very accessible home. The patient and daughters primary concern is related to her medications and hallucinations last night. Regarding mobility, it is anticipated that the patient will D/C to home with family support when medically stable. Recommending HHPT due to slow progress with mobility.   Follow Up Recommendations  Home health PT;Supervision for mobility/OOB     Equipment Recommendations  Rolling walker with 5" wheels    Recommendations for Other Services       Precautions / Restrictions Precautions Precautions: Back Precaution Comments: recalling 3/3 precautions, reminder with bed mobility Required Braces or Orthoses: Spinal Brace Spinal Brace: Lumbar corset;Applied in sitting position Restrictions Weight Bearing Restrictions: No    Mobility  Bed Mobility Overal bed mobility: Needs Assistance Bed Mobility: Rolling;Sit to Sidelying Rolling: Supervision Sidelying to sit: Supervision       General bed mobility comments: cues for logroll technique.   Transfers Overall transfer level: Needs assistance Equipment used: Rolling walker (2 wheeled) Transfers: Sit to/from Stand Sit to Stand: Min guard         General transfer comment: min guard for safety  Ambulation/Gait Ambulation/Gait assistance: Supervision Ambulation Distance (Feet): 65 Feet Assistive device: Rolling walker (2 wheeled) Gait Pattern/deviations: Step-through pattern Gait velocity: decreased   General Gait  Details: slow pattern but no loss of balance, cues for posture provided as needed.    Stairs            Wheelchair Mobility    Modified Rankin (Stroke Patients Only)       Balance Overall balance assessment: Needs assistance Sitting-balance support: No upper extremity supported Sitting balance-Leahy Scale: Good     Standing balance support: Bilateral upper extremity supported Standing balance-Leahy Scale: Fair Standing balance comment: static standing                    Cognition Arousal/Alertness: Awake/alert Behavior During Therapy: WFL for tasks assessed/performed;Anxious (anxious regarding hallucinations last night) Overall Cognitive Status: Within Functional Limits for tasks assessed                      Exercises      General Comments General comments (skin integrity, edema, etc.): Daughter present throughout session      Pertinent Vitals/Pain Pain Assessment: Faces Faces Pain Scale: Hurts even more Pain Location: back  Pain Descriptors / Indicators: Guarding;Grimacing Pain Intervention(s): Limited activity within patient's tolerance;Monitored during session    Home Living                      Prior Function            PT Goals (current goals can now be found in the care plan section) Acute Rehab PT Goals Patient Stated Goal: be able to go home PT Goal Formulation: With patient/family Time For Goal Achievement: 09/26/15 Potential to Achieve Goals: Good Progress towards PT goals: Progressing toward goals    Frequency  Min 5X/week    PT Plan Current plan remains appropriate  Co-evaluation             End of Session Equipment Utilized During Treatment: Gait belt;Back brace Activity Tolerance: Patient tolerated treatment well Patient left: in chair;with call bell/phone within reach;with family/visitor present     Time: [id number] PT Time Calculation (min) (ACUTE ONLY): 32 min  Charges:  $Gait Training:  8-22 mins $Therapeutic Activity: 8-22 mins                    G Codes:      Cassell Clement, PT, CSCS Pager [phone] Office [phone]  09/14/2015, 3:19 PM

## 2015-09-14 NOTE — Care Management Important Message (Signed)
Important Message  Patient Details  Name: [name] MRN: [id number] Date of Birth: 1946-05-05   Medicare Important Message Given:  Yes    Loann Quill 09/14/2015, 11:23 AM

## 2015-09-14 NOTE — [place] FL2 (Signed)
[place] MEDICAID FL2 LEVEL OF CARE SCREENING TOOL     IDENTIFICATION  Patient Name: [name] Birthdate: [birth year] Sex: female Admission Date (Current Location): 09/08/2015  [facility] and [place] Number:  Herbalist and Address:  [facility], [place] [address], [place], [place] [address]      Provider Number: [id number]  Attending Physician Name and Address:  Jessy Oto, MD  Relative Name and Phone Number:       Current Level of Care: Hospital Recommended Level of Care: [place] Prior Approval Number:    Date Approved/Denied:   PASRR Number:  [id number]  Discharge Plan: SNF    Current Diagnoses: Patient Active Problem List   Diagnosis Date Noted  . Spinal stenosis, lumbar region, with neurogenic claudication 09/08/2015    Class: Chronic  . OSA (obstructive sleep apnea) 10/17/2013  . Dyspnea 09/04/2013  . Atypical chest pain 09/04/2013  . Morbid obesity ([place]) 03/21/2012  . Cancer of lower-outer quadrant of female breast ([place]) 03/15/2012  . Diabetes mellitus type II 02/22/2011  . Coronary artery disease   . Hypertension   . Hyperlipidemia   . OA (osteoarthritis) of knee     Orientation RESPIRATION BLADDER Height & Weight     Self, Situation, Place  Normal Continent Weight: 257 lb 11 oz (116.886 kg) Height:  5' 4.5" (163.8 cm)  BEHAVIORAL SYMPTOMS/MOOD NEUROLOGICAL BOWEL NUTRITION STATUS      Continent Diet (low sodium heart healthy )  AMBULATORY STATUS COMMUNICATION OF NEEDS Skin   Limited Assist Verbally Normal (incision- )                       Personal Care Assistance Level of Assistance  Bathing, Feeding, Dressing Bathing Assistance: Limited assistance Feeding assistance: Independent Dressing Assistance: Limited assistance     Functional Limitations Info  Sight, Hearing, Speech Sight Info: Adequate Hearing Info: Adequate Speech Info: Adequate    SPECIAL CARE FACTORS FREQUENCY  PT (By  licensed PT)                    Contractures      Additional Factors Info  Code Status, Allergies Code Status Info: Not on File Allergies Info: Statins           Current Medications (09/14/2015):  This is the current hospital active medication list Current Facility-Administered Medications  Medication Dose Route Frequency Provider Last Rate Last Dose  . acetaminophen (TYLENOL) tablet 650 mg  650 mg Oral Q4H PRN Jessy Oto, MD   650 mg at 09/12/15 0840   Or  . acetaminophen (TYLENOL) suppository 650 mg  650 mg Rectal Q4H PRN Jessy Oto, MD      . albuterol (PROVENTIL) (2.5 MG/3ML) 0.083% nebulizer solution 3 mL  3 mL Inhalation Q4H PRN Jessy Oto, MD      . alum & mag hydroxide-simeth (MAALOX/MYLANTA) 200-200-20 MG/5ML suspension 30 mL  30 mL Oral Q6H PRN Jessy Oto, MD      . amLODipine (NORVASC) tablet 5 mg  5 mg Oral Daily Jessy Oto, MD   5 mg at 09/14/15 [id number]  . aspirin chewable tablet 81 mg  81 mg Oral Daily Jessy Oto, MD   81 mg at 09/14/15 0930  . B-complex with vitamin C tablet 1 tablet  1 tablet Oral BID Jessy Oto, MD   1 tablet at 09/14/15 0930  . baclofen (LIORESAL) tablet  10 mg  10 mg Oral BID Jessy Oto, MD   10 mg at 09/14/15 0930  . benazepril (LOTENSIN) tablet 20 mg  20 mg Oral Daily Jessy Oto, MD   20 mg at 09/14/15 [id number]  . bisacodyl (DULCOLAX) EC tablet 5 mg  5 mg Oral Daily PRN Jessy Oto, MD      . budesonide (PULMICORT) nebulizer solution 0.5 mg  0.5 mg Nebulization BID Jessy Oto, MD   0.5 mg at 09/14/15 0851  . buPROPion (WELLBUTRIN SR) 12 hr tablet 150 mg  150 mg Oral Daily Jessy Oto, MD   150 mg at 09/14/15 0929  . cholecalciferol (VITAMIN D) tablet 2,000 Units  2,000 Units Oral q morning - 10a Jessy Oto, MD   2,000 Units at 09/14/15 [phone]  . docusate sodium (COLACE) capsule 100 mg  100 mg Oral BID Jessy Oto, MD   100 mg at 09/14/15 0935  . DULoxetine (CYMBALTA) DR capsule 60 mg  60 mg Oral Daily Jessy Oto, MD   60 mg at 09/14/15 0930  . ferrous gluconate (FERGON) tablet 324 mg  324 mg Oral BID WC Jessy Oto, MD   324 mg at 09/14/15 0858  . folic acid (FOLVITE) tablet 1 mg  1 mg Oral Daily Jessy Oto, MD   1 mg at 09/14/15 0930  . gabapentin (NEURONTIN) tablet 600 mg  600 mg Oral TID Meredith Pel, MD   600 mg at 09/14/15 0930  . hydrochlorothiazide (HYDRODIURIL) tablet 25 mg  25 mg Oral Daily Jessy Oto, MD   25 mg at 09/14/15 0929  . HYDROcodone-acetaminophen (NORCO/VICODIN) 5-325 MG per tablet 1 tablet  1 tablet Oral Q4H PRN Meredith Pel, MD   1 tablet at 09/14/15 1205  . loratadine (CLARITIN) tablet 10 mg  10 mg Oral Daily Jessy Oto, MD   10 mg at 09/14/15 0930  . magnesium hydroxide (MILK OF MAGNESIA) suspension 30 mL  30 mL Oral Daily PRN Jessy Oto, MD      . meloxicam [facility]) tablet 15 mg  15 mg Oral Daily Jessy Oto, MD   15 mg at 09/14/15 0929  . menthol-cetylpyridinium (CEPACOL) lozenge 3 mg  1 lozenge Oral PRN Jessy Oto, MD       Or  . phenol (CHLORASEPTIC) mouth spray 1 spray  1 spray Mouth/Throat PRN Jessy Oto, MD      . methocarbamol (ROBAXIN) tablet 500 mg  500 mg Oral Q6H PRN Jessy Oto, MD   500 mg at 09/12/15 2106   Or  . methocarbamol (ROBAXIN) 500 mg in dextrose 5 % 50 mL IVPB  500 mg Intravenous Q6H PRN Jessy Oto, MD      . morphine 2 MG/ML injection 1-4 mg  1-4 mg Intravenous Q3H PRN Jessy Oto, MD   2 mg at 09/11/15 1545  . multivitamin with minerals tablet 1 tablet  1 tablet Oral Daily Jessy Oto, MD   1 tablet at 09/14/15 0929  . nebivolol (BYSTOLIC) tablet 10 mg  10 mg Oral Daily Jessy Oto, MD   10 mg at 09/14/15 [id number]  . ondansetron (ZOFRAN) injection 4 mg  4 mg Intravenous Q4H PRN Jessy Oto, MD      . pantoprazole (PROTONIX) EC tablet 40 mg  40 mg Oral QHS Jessy Oto, MD   40 mg at 09/13/15 2100  . polyethylene glycol (  MIRALAX / GLYCOLAX) packet 17 g  17 g Oral Daily PRN Jessy Oto, MD   17 g at  09/14/15 1056  . potassium citrate (UROCIT-K) SR tablet 10 mEq  10 mEq Oral BID WC Jessy Oto, MD   10 mEq at 09/14/15 0858  . predniSONE (STERAPRED UNI-PAK 21 TAB) tablet 10 mg  10 mg Oral 3 x daily with food Jessy Oto, MD   10 mg at 09/14/15 1206  . [START ON 09/15/2015] predniSONE (STERAPRED UNI-PAK 21 TAB) tablet 10 mg  10 mg Oral 4X daily taper Jessy Oto, MD      . predniSONE (STERAPRED UNI-PAK 21 TAB) tablet 20 mg  20 mg Oral Nightly Jessy Oto, MD      . vitamin E capsule 100 Units  100 Units Oral Daily Jessy Oto, MD   100 Units at 09/14/15 [id number]  . zolpidem (AMBIEN) tablet 5 mg  5 mg Oral QHS PRN Jessy Oto, MD   5 mg at 09/11/15 0203     Discharge Medications: Please see discharge summary for a list of discharge medications.  Relevant Imaging Results:  Relevant Lab Results:   Additional Information SS#: [id number]  Raymondo Band, LCSW    Patient showed clinical improvement and was discharged to home with family assistance and [facility] referral for PT

## 2015-09-14 NOTE — Progress Notes (Signed)
      Subjective: 6 Days Post-Op Procedure(s) (LRB): CENTRAL DECOMPRESSIVE LUMBAR LAMINECTOMIES L3-4, L4-5 AND BILATERAL HEMILAMINECTOMY L5-S1 (N/A) Awake, alert and oriented x 3. Has questions about where everything is Okay. I reassured her. Legs are NV normal. She is experiencing spasm When she Trying to stand and walk.  Patient reports pain as moderate.   Objective:   VITALS:  Temp:  [98 F (36.7 C)-98.5 F (36.9 C)] 98.3 F (36.8 C) (02/20 0956) Pulse Rate:  [72-77] 76 (02/20 0956) Resp:  [17-20] 17 (02/20 0956) BP: (118-132)/(58-71) 124/71 mmHg (02/20 0956) SpO2:  [92 %-97 %] 95 % (02/20 0956)  Neurologically intact ABD soft Neurovascular intact Sensation intact distally Intact pulses distally Dorsiflexion/Plantar flexion intact Incision: no drainage No cellulitis present   LABS  Recent Labs  09/13/15 1538  HGB 11.0*  WBC 8.3  PLT 241    Recent Labs  09/13/15 1538  NA 138  K 3.4*  CL 96*  CO2 29  BUN 14  CREATININE 0.77  GLUCOSE 144*   No results for input(s): LABPT, INR in the last 72 hours.   Assessment/Plan: 6 Days Post-Op Procedure(s) (LRB): CENTRAL DECOMPRESSIVE LUMBAR LAMINECTOMIES L3-4, L4-5 AND BILATERAL HEMILAMINECTOMY L5-S1 (N/A)  Assessment/Plan: 6 Days Post-Op Procedure(s) (LRB): L3-4 and L4-5 DECOMPRESSION (N/A)Bilateral hemilaminectomies L5-S1  Advance diet Up with therapy Discharge to SNF She is slow with PT/OT was down for 3 days post op and is making  Extremely slow progress with PT. Lab work is without significant abnormalities, I suspect that this is  Slow rehab post >2 level laminectomy, will probably need SNF until she Is more mobile. No sign of recurrent CSF leak.  NITKA,JAMES E 09/14/2015, 11:03 AM  AM

## 2015-09-14 NOTE — Progress Notes (Signed)
Per Case manager, the patient's daughter is concerned with discharging pt home due to her being intermittently confused and has experienced hallucinations. Spoke with Dr. Louanne Skye.  Pt will not be discharged today.  Awaiting PT recommendations.  Fentanyl patch discontinued and removed from patient's rt arm this am.  Will continue to monitor and offer support.

## 2015-09-15 MED ORDER — BACLOFEN 10 MG PO TABS: 10.0000 mg | ORAL_TABLET | Freq: Two times a day (BID) | ORAL | Status: DC

## 2015-09-15 MED ORDER — GABAPENTIN 600 MG PO TABS: 600.0000 mg | ORAL_TABLET | Freq: Three times a day (TID) | ORAL | Status: DC

## 2015-09-15 MED ORDER — HYDROXYCHLOROQUINE SULFATE 200 MG PO TABS: 200.0000 mg | ORAL_TABLET | Freq: Every day | ORAL | Status: DC

## 2015-09-15 MED ORDER — PREDNISONE 10 MG (21) PO TBPK: 10.0000 mg | ORAL_TABLET | Freq: Four times a day (QID) | ORAL | Status: DC

## 2015-09-15 NOTE — Progress Notes (Signed)
     Subjective: 7 Days Post-Op Procedure(s) (LRB): CENTRAL DECOMPRESSIVE LUMBAR LAMINECTOMIES L3-4, L4-5 AND BILATERAL HEMILAMINECTOMY L5-S1 (N/A) Awake, alert and oriented x 4. Tolerating po meds and nourishment. Voiding without difficulty. Taking tylenol only for pain and much less. Did well with PT/OT yesterday.  Does not need SNF and family is  Agreeable to her going home today. Patient reports pain as moderate.    Objective:   VITALS:  Temp:  [97.5 F (36.4 C)-98.6 F (37 C)] 97.5 F (36.4 C) (02/21 0535) Pulse Rate:  [62-78] 65 (02/21 0535) Resp:  [16-20] 20 (02/21 0535) BP: (120-138)/(61-87) 138/87 mmHg (02/21 0535) SpO2:  [96 %-100 %] 99 % (02/21 0923)  Neurologically intact ABD soft Neurovascular intact Sensation intact distally Intact pulses distally Dorsiflexion/Plantar flexion intact Incision: dressing C/D/I and no drainage   LABS  Recent Labs  09/13/15 1538  HGB 11.0*  WBC 8.3  PLT 241    Recent Labs  09/13/15 1538  NA 138  K 3.4*  CL 96*  CO2 29  BUN 14  CREATININE 0.77  GLUCOSE 144*   No results for input(s): LABPT, INR in the last 72 hours.   Assessment/Plan: 7 Days Post-Op Procedure(s) (LRB): CENTRAL DECOMPRESSIVE LUMBAR LAMINECTOMIES L3-4, L4-5 AND BILATERAL HEMILAMINECTOMY L5-S1 (N/A)  Advance diet Up with therapy Discharge home with home health  [name] 09/15/2015, 9:56 AM

## 2015-09-15 NOTE — Progress Notes (Signed)
Occupational Therapy Treatment Patient Details Name: [name] MRN: [id number] DOB: 31-Aug-1945 Today's Date: 09/15/2015    History of present illness 70 y.o. female s/p CENTRAL DECOMPRESSIVE LUMBAR LAMINECTOMIES L3-4, L4-5 AND BILATERAL HEMILAMINECTOMY L5-S1.   OT comments  ADL retraining session today re: back precautions, DME and shower transfers. Pt/daughter report that she will initially sponge bathe at home. Pt/family state that she will have 24/7 PRN A, has some DME and AE, recommend LH sponge and toilet aide to assist with adhering to back precautions. Also reccommed anterior/posterior shower transfers, however pt/family states they will use tub bench in handicapped shower. They were cautioned about adhering to back precautions if they use this technique - refer to shower transfer section in this note for details. Pt/family report no further acute OT needs at this time.   Follow Up Recommendations  No OT follow up;Supervision/Assistance - 24 hour    Equipment Recommendations  Other (comment) (LH AE (has reacher - discussed LH sponge, toilet aide))    Recommendations for Other Services      Precautions / Restrictions Precautions Precautions: Back Precaution Booklet Issued: No Required Braces or Orthoses: Spinal Brace Spinal Brace: Lumbar corset;Applied in sitting position Restrictions Weight Bearing Restrictions: No       Mobility Bed Mobility Overal bed mobility:  (Pt up in chair upon OT arrival w/ back brace donned)                Transfers Overall transfer level: Needs assistance Equipment used: Rolling walker (2 wheeled) Transfers: Sit to/from Stand Sit to Stand: Min guard         General transfer comment: Min guard for safety    Balance                                   ADL Overall ADL's : Needs assistance/impaired Eating/Feeding: Independent;Sitting   Grooming: Modified independent;Sitting                            Tub/ Shower Transfer: Walk-in shower;Min guard;Ambulation;Tub bench;Rolling walker;Adhering to back precautions Tub/Shower Transfer Details (indicate cue type and reason): Verbally reivewed with patient and her daughter. Recommended anterior/posterior shower transfer, pt daughter states "She could back up to anything before this surgery & we know that that wouldn't be safe for her" Per detailed discussion, they have handicapped accessible bathroom and wide/large shower w/ tub bench. They plan to use this set-up after intially sponge bathing for first few days, Pt/daughter both state that she will have PRN A 24/7 and "We will be able to help with whatever is needed" Habdout issued and reviewed for recommended anterior/posterior transfer into shower - demonstration provided as well.  Discussed  back precautions w/ shower transfers using tub bench at home and No bending, arching or twisting. They verbalized understanding of this after discussion today. OT also recommended use of LH sponge for increased Independence with LB ADL's while adhering to back precautions.   General ADL Comments: Pt was up in chair eating breakfast upon OT arrival today. Lumbar brace was donned. Discussed LB ADL's and shower transfers with pt/daughter whom both state that she will have 24/7 PRN A when discharged. Reviewed back precautions and recommendations for postioning during functional mobility and transfers to ensure that back precautions are adhered to (especially shower transfers/toileting and LB ADL's). They report that they have necessary DME at home  and some A/E. Pt/dauther verbalized understanding of this after discussion today. Pt/daughter report no further acute OT needs or concerns at this time.      Vision  No change from baseline                   Perception     Praxis      Cognition   Behavior During Therapy: WFL for tasks assessed/performed Overall Cognitive Status: Within Functional Limits for  tasks assessed                       Extremity/Trunk Assessment               Exercises     Shoulder Instructions       General Comments      Pertinent Vitals/ Pain       Pain Assessment: 0-10 Pain Score: 3  Pain Location: Back Pain Descriptors / Indicators: Sore;Aching Pain Intervention(s): Limited activity within patient's tolerance;Monitored during session;Other (comment) (Pt states that RN to bring tylenol with next round of medication)  Home Living                                          Prior Functioning/Environment              Frequency Min 3X/week     Progress Toward Goals  OT Goals(current goals can now be found in the care plan section)  Progress towards OT goals: Progressing toward goals  Acute Rehab OT Goals Patient Stated Goal: be able to go home  Plan Discharge plan remains appropriate    Co-evaluation                 End of Session Equipment Utilized During Treatment: Back brace;Other (comment) (Handouts)   Activity Tolerance Patient tolerated treatment well   Patient Left in chair;with call bell/phone within reach;with family/visitor present   Nurse Communication          Time: [id number] OT Time Calculation (min): 11 min  Charges: OT General Charges $OT Visit: 1 Procedure OT Treatments $Self Care/Home Management : 8-22 mins  Jakolby Sedivy Beth Dixon, OTR/L 09/15/2015, 8:35 AM

## 2015-09-15 NOTE — Care Management Note (Signed)
Case Management Note  Patient Details  Name: [name] MRN: [id number] Date of Birth: Dec 18, 1945  Subjective/Objective:                    Action/Plan: Patient discharging home today with [facility] services. Pt has walker for home and [facility] already set up to see patient tomorrow. No further needs per CM.   Expected Discharge Date:                  Expected Discharge Plan:  Home/Self Care  In-House Referral:     Discharge planning Services  CM Consult  Post Acute Care Choice:  Durable Medical Equipment Choice offered to:  Adult Children  DME Arranged:  Walker rolling, 3-N-1 DME Agency:  [place]:    [place] Agency:     Status of Service:  Completed, signed off  Medicare Important Message Given:  Yes Date Medicare IM Given:    Medicare IM give by:    Date Additional Medicare IM Given:    Additional Medicare Important Message give by:     If discussed at [place] of Stay Meetings, dates discussed:    Additional Comments:  Pollie Friar, RN 09/15/2015, 11:01 AM

## 2015-09-15 NOTE — Progress Notes (Signed)
Pt d/c to home by car with family. Assessment stable. Prescriptions and prednisone dose pack given. All questions answered.

## 2015-09-16 DIAGNOSIS — G4733 Obstructive sleep apnea (adult) (pediatric): Secondary | ICD-10-CM | POA: Diagnosis not present

## 2015-09-16 DIAGNOSIS — F329 Major depressive disorder, single episode, unspecified: Secondary | ICD-10-CM | POA: Diagnosis not present

## 2015-09-16 DIAGNOSIS — M069 Rheumatoid arthritis, unspecified: Secondary | ICD-10-CM | POA: Diagnosis not present

## 2015-09-16 DIAGNOSIS — M150 Primary generalized (osteo)arthritis: Secondary | ICD-10-CM | POA: Diagnosis not present

## 2015-09-16 DIAGNOSIS — E785 Hyperlipidemia, unspecified: Secondary | ICD-10-CM | POA: Diagnosis not present

## 2015-09-16 DIAGNOSIS — Z4789 Encounter for other orthopedic aftercare: Secondary | ICD-10-CM | POA: Diagnosis not present

## 2015-09-16 DIAGNOSIS — E1151 Type 2 diabetes mellitus with diabetic peripheral angiopathy without gangrene: Secondary | ICD-10-CM | POA: Diagnosis not present

## 2015-09-16 DIAGNOSIS — I2510 Atherosclerotic heart disease of native coronary artery without angina pectoris: Secondary | ICD-10-CM | POA: Diagnosis not present

## 2015-09-16 DIAGNOSIS — I10 Essential (primary) hypertension: Secondary | ICD-10-CM | POA: Diagnosis not present

## 2015-09-16 DIAGNOSIS — F419 Anxiety disorder, unspecified: Secondary | ICD-10-CM | POA: Diagnosis not present

## 2015-09-16 DIAGNOSIS — J45909 Unspecified asthma, uncomplicated: Secondary | ICD-10-CM | POA: Diagnosis not present

## 2015-10-04 NOTE — Discharge Summary (Signed)
Patient ID: [name] MRN: [id number] DOB/AGE: Nov 11, 1945 70 y.o.  Admit date: 09/08/2015 Discharge date: 10/04/2015  Admission Diagnoses:  Principal Problem:   Spinal stenosis, lumbar region, with neurogenic claudication   Discharge Diagnoses:  Principal Problem:   Spinal stenosis, lumbar region, with neurogenic claudication  status post Procedure(s): CENTRAL DECOMPRESSIVE LUMBAR LAMINECTOMIES L3-4, L4-5 AND BILATERAL HEMILAMINECTOMY L5-S1  Past Medical History  Diagnosis Date  . Cancer [facility])     breast  . Coronary artery disease   . Hyperlipidemia     not on any meds  . Breast cancer ([place])   . OA (osteoarthritis) of knee   . OSA (obstructive sleep apnea)   . Depression     takes CYmbalta and Wellbutrin daily  . Asthma     Albuterol inhaler as needed.Pulmicort neb as needed  . Asthma   . Hypertension     takes Benazepril,Bystolic,and Amlodipine daily  . GERD (gastroesophageal reflux disease)     takes Omeprazole daily  . Peripheral edema     takes daily as needed  . Dyspnea     with exertion occasionally when lies down  . Pneumonia     hx of-several yrs ago  . Headache     oocasionally  . Weakness     numbness and tingling mainly in left leg occasionally in right.Tingling/numbness in hands  . Peripheral neuropathy ([place])   . Joint pain   . Joint swelling   . Osteoarthritis   . RA (rheumatoid arthritis) ([place])   . Chronic back pain     spinal stenosis  . IBS (irritable bowel syndrome)   . History of kidney stones   . Urinary frequency   . Urinary urgency   . Nocturia   . Diabetes mellitus     not on any meds/controlled by diet  . Diabetes mellitus type II     Surgeries: Procedure(s): CENTRAL DECOMPRESSIVE LUMBAR LAMINECTOMIES L3-4, L4-5 AND BILATERAL HEMILAMINECTOMY L5-S1 on 09/08/2015   Consultants:    Discharged Condition: Improved  Hospital Course: [name] is an 70 y.o. female who was admitted 09/08/2015 for operative treatment of  Spinal stenosis, lumbar region, with neurogenic claudication. Patient failed conservative treatments (please see the history and physical for the specifics) and had severe unremitting pain that affects sleep, daily activities and work/hobbies. After pre-op clearance, the patient was taken to the operating room on 09/08/2015 and underwent  Procedure(s): CENTRAL DECOMPRESSIVE LUMBAR LAMINECTOMIES L3-4, L4-5 AND BILATERAL HEMILAMINECTOMY L5-S1.    Patient was given perioperative antibiotics:  Anti-infectives    Start     Dose/Rate Route Frequency Ordered Stop   09/15/15 0000  hydroxychloroquine (PLAQUENIL) 200 MG tablet    Comments:  Take daily five times per week, Monday thru Friday.   200 mg Oral Daily 09/15/15 1005     09/14/15 1830  hydroxychloroquine (PLAQUENIL) tablet 200 mg     200 mg Oral  Once 09/14/15 1822 09/14/15 2148   09/08/15 2100  ceFAZolin (ANCEF) IVPB 1 g/50 mL premix     1 g 100 mL/hr over 30 Minutes Intravenous Every 8 hours 09/08/15 1912 09/09/15 0609   09/08/15 1200  ceFAZolin (ANCEF) IVPB 2 g/50 mL premix     2 g 100 mL/hr over 30 Minutes Intravenous To [facility] 09/07/15 0950 09/08/15 1308       Patient was given sequential compression devices and early ambulation to prevent DVT.   Patient benefited maximally from hospital stay and there were no complications.  At the time of discharge, the patient was urinating/moving their bowels without difficulty, tolerating a regular diet, pain is controlled with oral pain medications and they have been cleared by PT/OT.   Recent vital signs: No data found.    Recent laboratory studies: No results for input(s): WBC, HGB, HCT, PLT, NA, K, CL, CO2, BUN, CREATININE, GLUCOSE, INR, CALCIUM in the last 72 hours.  Invalid input(s): PT, 2   Discharge Medications:     Medication List    TAKE these medications        albuterol 108 (90 Base) MCG/ACT inhaler  Commonly known as:  PROVENTIL HFA;VENTOLIN HFA  Inhale 2 puffs  into the lungs every 4 (four) hours as needed for wheezing or shortness of breath (cough, shortness of breath or wheezing.).     ALFALFA PO  Take 2 tablets by mouth 2 (two) times daily.     amLODipine 5 MG tablet  Commonly known as:  NORVASC  Take 5 mg by mouth Daily.     aspirin 81 MG tablet  Take 81 mg by mouth daily.     b complex vitamins tablet  Take 1 tablet by mouth 2 (two) times daily.     baclofen 10 MG tablet  Commonly known as:  LIORESAL  Take 1 tablet (10 mg total) by mouth 2 (two) times daily.     benazepril 20 MG tablet  Commonly known as:  LOTENSIN  Take 20 mg by mouth daily.     budesonide 0.5 MG/2ML nebulizer solution  Commonly known as:  PULMICORT  Take 0.5 mg by nebulization 2 (two) times daily.     buPROPion 150 MG 12 hr tablet  Commonly known as:  WELLBUTRIN SR  Take 150 mg by mouth daily.     DULoxetine 30 MG capsule  Commonly known as:  CYMBALTA  Take 60 mg by mouth daily.     FLAX SEED OIL PO  Take 1,000 mg by mouth 2 (two) times daily.     folic acid 1 MG tablet  Commonly known as:  FOLVITE  Take 1 mg by mouth daily.     furosemide 20 MG tablet  Commonly known as:  LASIX  Take 20 mg by mouth as needed.     gabapentin 300 MG capsule  Commonly known as:  NEURONTIN  Take 1 capsule by mouth 3 (three) times daily.     gabapentin 600 MG tablet  Commonly known as:  NEURONTIN  Take 1 tablet (600 mg total) by mouth 3 (three) times daily.     hydrochlorothiazide 25 MG tablet  Commonly known as:  HYDRODIURIL  Take 25 mg by mouth daily.     hydroxychloroquine 200 MG tablet  Commonly known as:  PLAQUENIL  Take 1 tablet (200 mg total) by mouth daily. Mon-Fri     loratadine 10 MG tablet  Commonly known as:  CLARITIN  Take 10 mg by mouth daily.     meloxicam 15 MG tablet  Commonly known as:  MOBIC  Take 15 mg by mouth daily.     methotrexate 1 g injection  Commonly known as:  50 mg/ml  Inject 0.8 mg into the vein every 7 (seven) days.  Sunday     multivitamin tablet  Take 1 tablet by mouth daily.     nebivolol 10 MG tablet  Commonly known as:  BYSTOLIC  Take 10 mg by mouth daily.     omeprazole 20 MG capsule  Commonly known as:  PRILOSEC  Take 20 mg by mouth daily.     POTASSIUM PO  Take by mouth 2 (two) times daily.     predniSONE 10 MG (21) Tbpk tablet  Commonly known as:  STERAPRED UNI-PAK 21 TAB  Take 1 tablet (10 mg total) by mouth taper from 4 doses each day to 1 dose and stop. Take the remainder of the dose pak as written, days 3-6.     Vitamin D3 2000 units Tabs  Take 2,000 Units by mouth.     VITAMIN E PO  Take 200 mg by mouth daily.        Diagnostic Studies: Dg Lumbar Spine 2-3 Views  09/12/2015  CLINICAL DATA:  70 year old female with lumbar spine pain and sciatica into both legs EXAM: LUMBAR SPINE - 2-3 VIEW COMPARISON:  Prior MRI lumbar spine 07/22/2015 FINDINGS: No evidence of acute fracture or malalignment. Multilevel degenerative disc disease is again noted. Degenerative changes most severe at L2-L3 were there is a prominent posterior disc osteophyte complex. Interval L3 and L4 spinous process resection and probable laminectomies. No significant interval change in alignment. Lower lumbar facet arthropathy is again noted. Bony mineralization is within normal limits. No lytic or blastic osseous lesion. IMPRESSION: Interval surgical changes of L3 and L4 laminectomies and spinous process resection. Stable appearance of multilevel degenerative disc disease without significant interval progression compared to 07/22/2015. No acute fracture. Electronically Signed   By: Jacqulynn Cadet M.D.   On: 09/12/2015 15:33   Dg Lumbar Spine 1 View  09/08/2015  CLINICAL DATA:  Patient for L3 the S1 decompression. Intraoperative localization film. EXAM: LUMBAR SPINE - 1 VIEW COMPARISON:  MRI lumbar spine 07/22/2015. FINDINGS: A single intraoperative view of the lumbar spine in the lateral projection is provided.  Imaged demonstrates a probe directed toward the L3-4 disc interspace. IMPRESSION: Localization as above. Electronically Signed   By: Inge Rise M.D.   On: 09/08/2015 14:08        Discharge Instructions    Call MD / Call 911    Complete by:  As directed   If you experience chest pain or shortness of breath, CALL 911 and be transported to the hospital emergency room.  If you develope a fever above 101 F, pus (white drainage) or increased drainage or redness at the wound, or calf pain, call your surgeon's office.     Call MD / Call 911    Complete by:  As directed   If you experience chest pain or shortness of breath, CALL 911 and be transported to the hospital emergency room.  If you develope a fever above 101 F, pus (white drainage) or increased drainage or redness at the wound, or calf pain, call your surgeon's office.     Call MD / Call 911    Complete by:  As directed   If you experience chest pain or shortness of breath, CALL 911 and be transported to the hospital emergency room.  If you develope a fever above 101 F, pus (white drainage) or increased drainage or redness at the wound, or calf pain, call your surgeon's office.     Constipation Prevention    Complete by:  As directed   Drink plenty of fluids.  Prune juice may be helpful.  You may use a stool softener, such as Colace (over the counter) 100 mg twice a day.  Use MiraLax (over the counter) for constipation as needed.     Constipation Prevention    Complete by:  As  directed   Drink plenty of fluids.  Prune juice may be helpful.  You may use a stool softener, such as Colace (over the counter) 100 mg twice a day.  Use MiraLax (over the counter) for constipation as needed.     Constipation Prevention    Complete by:  As directed   Drink plenty of fluids.  Prune juice may be helpful.  You may use a stool softener, such as Colace (over the counter) 100 mg twice a day.  Use MiraLax (over the counter) for constipation as needed.      Diet - low sodium heart healthy    Complete by:  As directed      Diet - low sodium heart healthy    Complete by:  As directed      Diet - low sodium heart healthy    Complete by:  As directed      Increase activity slowly as tolerated    Complete by:  As directed      Increase activity slowly as tolerated    Complete by:  As directed      Increase activity slowly as tolerated    Complete by:  As directed            Follow-up Information    Follow up with Jessy Oto, MD On 09/23/2015.   Specialty:  Orthopedic Surgery   Why:  For wound re-check at [facility] information:   [place] [phone]       Follow up with [facility].   Why:  rolling walker and 3n1   Contact information:   [place] [id number] [phone]       Discharge Plan:  discharge to home  Disposition:     Signed: Lanae Crumbly  10/04/2015, 6:28 PM

## 2015-10-27 DIAGNOSIS — N8189 Other female genital prolapse: Secondary | ICD-10-CM | POA: Diagnosis not present

## 2015-10-27 DIAGNOSIS — Z01419 Encounter for gynecological examination (general) (routine) without abnormal findings: Secondary | ICD-10-CM | POA: Diagnosis not present

## 2015-10-27 DIAGNOSIS — Z6841 Body Mass Index (BMI) 40.0 and over, adult: Secondary | ICD-10-CM | POA: Diagnosis not present

## 2015-11-05 DIAGNOSIS — Z79899 Other long term (current) drug therapy: Secondary | ICD-10-CM | POA: Diagnosis not present

## 2015-11-12 DIAGNOSIS — I10 Essential (primary) hypertension: Secondary | ICD-10-CM | POA: Diagnosis not present

## 2015-11-12 DIAGNOSIS — F329 Major depressive disorder, single episode, unspecified: Secondary | ICD-10-CM | POA: Diagnosis not present

## 2015-11-12 DIAGNOSIS — M1990 Unspecified osteoarthritis, unspecified site: Secondary | ICD-10-CM | POA: Diagnosis not present

## 2015-11-12 DIAGNOSIS — E1151 Type 2 diabetes mellitus with diabetic peripheral angiopathy without gangrene: Secondary | ICD-10-CM | POA: Diagnosis not present

## 2015-11-12 DIAGNOSIS — E784 Other hyperlipidemia: Secondary | ICD-10-CM | POA: Diagnosis not present

## 2015-11-12 DIAGNOSIS — M0689 Other specified rheumatoid arthritis, multiple sites: Secondary | ICD-10-CM | POA: Diagnosis not present

## 2015-11-12 DIAGNOSIS — I2510 Atherosclerotic heart disease of native coronary artery without angina pectoris: Secondary | ICD-10-CM | POA: Diagnosis not present

## 2015-11-12 DIAGNOSIS — Z6841 Body Mass Index (BMI) 40.0 and over, adult: Secondary | ICD-10-CM | POA: Diagnosis not present

## 2015-12-03 DIAGNOSIS — I872 Venous insufficiency (chronic) (peripheral): Secondary | ICD-10-CM | POA: Diagnosis not present

## 2015-12-08 DIAGNOSIS — M19271 Secondary osteoarthritis, right ankle and foot: Secondary | ICD-10-CM | POA: Diagnosis not present

## 2015-12-08 DIAGNOSIS — M0579 Rheumatoid arthritis with rheumatoid factor of multiple sites without organ or systems involvement: Secondary | ICD-10-CM | POA: Diagnosis not present

## 2015-12-08 DIAGNOSIS — M5137 Other intervertebral disc degeneration, lumbosacral region: Secondary | ICD-10-CM | POA: Diagnosis not present

## 2015-12-08 DIAGNOSIS — Z09 Encounter for follow-up examination after completed treatment for conditions other than malignant neoplasm: Secondary | ICD-10-CM | POA: Diagnosis not present

## 2015-12-24 ENCOUNTER — Other Ambulatory Visit (HOSPITAL_BASED_OUTPATIENT_CLINIC_OR_DEPARTMENT_OTHER): Payer: PPO

## 2015-12-24 ENCOUNTER — Encounter: Payer: Self-pay | Admitting: Hematology & Oncology

## 2015-12-24 ENCOUNTER — Ambulatory Visit (HOSPITAL_BASED_OUTPATIENT_CLINIC_OR_DEPARTMENT_OTHER): Payer: PPO | Admitting: Hematology & Oncology

## 2015-12-24 VITALS — BP 167/82 | HR 70 | Temp 98.2°F | Resp 18 | Ht 64.5 in | Wt 257.0 lb

## 2015-12-24 DIAGNOSIS — M4807 Spinal stenosis, lumbosacral region: Secondary | ICD-10-CM | POA: Diagnosis not present

## 2015-12-24 DIAGNOSIS — Z853 Personal history of malignant neoplasm of breast: Secondary | ICD-10-CM

## 2015-12-24 DIAGNOSIS — C50512 Malignant neoplasm of lower-outer quadrant of left female breast: Secondary | ICD-10-CM

## 2015-12-24 DIAGNOSIS — C50511 Malignant neoplasm of lower-outer quadrant of right female breast: Secondary | ICD-10-CM

## 2015-12-24 DIAGNOSIS — M1730 Unilateral post-traumatic osteoarthritis, unspecified knee: Secondary | ICD-10-CM

## 2015-12-24 LAB — CBC WITH DIFFERENTIAL (CANCER CENTER ONLY)
BASO#: 0 10*3/uL (ref 0.0–0.2)
BASO%: 0.3 % (ref 0.0–2.0)
EOS ABS: 0.1 10*3/uL (ref 0.0–0.5)
EOS%: 1 % (ref 0.0–7.0)
HEMATOCRIT: 39.8 % (ref 34.8–46.6)
HGB: 13.1 g/dL (ref 11.6–15.9)
LYMPH#: 2.8 10*3/uL (ref 0.9–3.3)
LYMPH%: 35.5 % (ref 14.0–48.0)
MCH: 30.3 pg (ref 26.0–34.0)
MCHC: 32.9 g/dL (ref 32.0–36.0)
MCV: 92 fL (ref 81–101)
MONO#: 0.7 10*3/uL (ref 0.1–0.9)
MONO%: 9.4 % (ref 0.0–13.0)
NEUT#: 4.2 10*3/uL (ref 1.5–6.5)
NEUT%: 53.8 % (ref 39.6–80.0)
Platelets: 196 10*3/uL (ref 145–400)
RBC: 4.32 10*6/uL (ref 3.70–5.32)
RDW: 14.6 % (ref 11.1–15.7)
WBC: 7.9 10*3/uL (ref 3.9–10.0)

## 2015-12-24 LAB — COMPREHENSIVE METABOLIC PANEL
ALBUMIN: 4.1 g/dL (ref 3.5–5.0)
ALK PHOS: 54 U/L (ref 40–150)
ALT: 21 U/L (ref 0–55)
ANION GAP: 8 meq/L (ref 3–11)
AST: 19 U/L (ref 5–34)
BILIRUBIN TOTAL: 0.48 mg/dL (ref 0.20–1.20)
BUN: 16.3 mg/dL (ref 7.0–26.0)
CALCIUM: 9.8 mg/dL (ref 8.4–10.4)
CO2: 29 mEq/L (ref 22–29)
CREATININE: 0.7 mg/dL (ref 0.6–1.1)
Chloride: 105 mEq/L (ref 98–109)
EGFR: 82 mL/min/{1.73_m2} — AB (ref >90)
GLUCOSE: 117 mg/dL (ref 70–140)
Potassium: 4.2 mEq/L (ref 3.5–5.1)
Sodium: 142 mEq/L (ref 136–145)
TOTAL PROTEIN: 7.2 g/dL (ref 6.4–8.3)

## 2015-12-24 NOTE — Progress Notes (Signed)
Hematology and Oncology Follow Up Visit  [name] [id number] January 29, 1946 70 y.o. 12/24/2015   Principle Diagnosis:  Stage  I (T1N0M0) ductal carcinoma the right breast   Current Therapy:    Observation     Interim History:  [name] is back for follow-up. We see her yearly. Shockingly enough, she had back surgery back in February. She has severe spinal stenosis from L3 down to S1. She had spinal surgery to help relieve the stenosis. Unfortunately, she had a dural tear that required fixing.  Her back feels better. She's not having any hip or upper leg pain. She has some chronic lower leg pain. I'm not sure why she has this part. She has brawny chronic edema in her lower legs.  She is on vitamin D.  Otherwise, she seems to be doing okay. She does have rheumatoid arthritis. She is taking methotrexate. I will note this might be contributing to some of the discomfort.  Her appetite has been good. She's trying to watch her weight. She's had no nausea or vomiting.   Overall, her performance status is ECOG 1.  Medications:  Current outpatient prescriptions:  .  albuterol (PROVENTIL HFA;VENTOLIN HFA) 108 (90 BASE) MCG/ACT inhaler, Inhale 2 puffs into the lungs every 4 (four) hours as needed for wheezing or shortness of breath (cough, shortness of breath or wheezing.)., Disp: 1 Inhaler, Rfl: 12 .  ALFALFA PO, Take 2 tablets by mouth 2 (two) times daily. , Disp: , Rfl:  .  amLODipine (NORVASC) 5 MG tablet, Take 5 mg by mouth Daily. , Disp: , Rfl:  .  aspirin 81 MG tablet, Take 81 mg by mouth daily.  , Disp: , Rfl:  .  b complex vitamins tablet, Take 1 tablet by mouth 2 (two) times daily.  , Disp: , Rfl:  .  baclofen (LIORESAL) 10 MG tablet, Take 1 tablet (10 mg total) by mouth 2 (two) times daily., Disp: 60 each, Rfl: 0 .  benazepril (LOTENSIN) 20 MG tablet, Take 20 mg by mouth daily. , Disp: , Rfl:  .  budesonide (PULMICORT) 0.5 MG/2ML nebulizer solution, Take 0.5 mg by nebulization 2  (two) times daily., Disp: , Rfl:  .  buPROPion (WELLBUTRIN SR) 150 MG 12 hr tablet, Take 150 mg by mouth daily.  , Disp: , Rfl:  .  Cholecalciferol (VITAMIN D3) 2000 UNITS TABS, Take 2,000 Units by mouth., Disp: , Rfl:  .  DULoxetine (CYMBALTA) 30 MG capsule, Take 60 mg by mouth daily. , Disp: , Rfl:  .  Flaxseed, Linseed, (FLAX SEED OIL PO), Take 1,000 mg by mouth 2 (two) times daily.  , Disp: , Rfl:  .  folic acid (FOLVITE) 1 MG tablet, Take 1 mg by mouth daily., Disp: , Rfl:  .  furosemide (LASIX) 20 MG tablet, Take 20 mg by mouth as needed.  , Disp: , Rfl:  .  gabapentin (NEURONTIN) 300 MG capsule, Take 1 capsule by mouth 3 (three) times daily., Disp: , Rfl:  .  gabapentin (NEURONTIN) 600 MG tablet, Take 1 tablet (600 mg total) by mouth 3 (three) times daily., Disp: 90 tablet, Rfl: 3 .  hydrochlorothiazide 25 MG tablet, Take 25 mg by mouth daily.  , Disp: , Rfl:  .  hydroxychloroquine (PLAQUENIL) 200 MG tablet, Take 1 tablet (200 mg total) by mouth daily. Mon-Fri, Disp: 20 tablet, Rfl: 6 .  loratadine (CLARITIN) 10 MG tablet, Take 10 mg by mouth daily., Disp: , Rfl:  .  meloxicam (MOBIC)  15 MG tablet, Take 15 mg by mouth daily.  , Disp: , Rfl:  .  methotrexate (50 MG/ML) 1 G injection, Inject 0.8 mg into the vein every 7 (seven) days. Sunday, Disp: , Rfl:  .  Multiple Vitamin (MULTIVITAMIN) tablet, Take 1 tablet by mouth daily.  , Disp: , Rfl:  .  nebivolol (BYSTOLIC) 10 MG tablet, Take 10 mg by mouth daily.  , Disp: , Rfl:  .  omeprazole (PRILOSEC) 20 MG capsule, Take 20 mg by mouth daily. , Disp: , Rfl:  .  POTASSIUM PO, Take by mouth 2 (two) times daily. , Disp: , Rfl:  .  predniSONE (STERAPRED UNI-PAK 21 TAB) 10 MG (21) TBPK tablet, Take 1 tablet (10 mg total) by mouth taper from 4 doses each day to 1 dose and stop. Take the remainder of the dose pak as written, days 3-6., Disp: 21 tablet, Rfl: 0 .  VITAMIN E PO, Take 200 mg by mouth daily. , Disp: , Rfl:   Allergies:  Allergies    Allergen Reactions  . Statins Other (See Comments)    Joint pain and swelling/burning    Past Medical History, Surgical history, Social history, and Family History were reviewed and updated.  Review of Systems: As above  Physical Exam:  height is 5' 4.5" (1.638 m) and weight is 257 lb (116.574 kg). Her oral temperature is 98.2 F (36.8 C). Her blood pressure is 167/82 and her pulse is 70. Her respiration is 18.   Wt Readings from Last 3 Encounters:  12/24/15 257 lb (116.574 kg)  09/08/15 257 lb 11 oz (116.886 kg)  09/03/15 257 lb 11.2 oz (116.892 kg)     Obese white female in no obvious distress. Head and neck exam shows no ocular or oral lesions. There are no palpable cervical or supraclavicular lymph nodes. Lungs are clear. No rales, wheezes or rhonchi are noted. Cardiac exam regular rate and rhythm with no murmurs, rubs or bruits. Breast exam shows left breast with no masses, edema or erythema. There is no left axillary adenopathy. Right breast shows the lumpectomy at the 75 position. She has radiation changes on the right breast. No distinct masses noted on the right breast. She has no right axillary adenopathy. Abdomen is obese but soft. She has good bowel sounds. There is no fluid wave. There is no palpable liver or spleen tip. Back exam shows no tenderness over the spine, ribs or hips. She has a healed laminectomy scar in the lumbar spine. Extremities shows no clubbing, cyanosis or edema. She does have some chronic brawny type edema in her lower legs. She has some varicose veins. Neurological exam shows no focal neurological deficits.  Lab Results  Component Value Date   WBC 7.9 12/24/2015   HGB 13.1 12/24/2015   HCT 39.8 12/24/2015   MCV 92 12/24/2015   PLT 196 12/24/2015     Chemistry      Component Value Date/Time   NA 138 09/13/2015 1538   K 3.4* 09/13/2015 1538   CL 96* 09/13/2015 1538   CO2 29 09/13/2015 1538   BUN 14 09/13/2015 1538   CREATININE 0.77 09/13/2015  1538      Component Value Date/Time   CALCIUM 9.4 09/13/2015 1538   ALKPHOS 43 09/13/2015 1538   AST 29 09/13/2015 1538   ALT 23 09/13/2015 1538   BILITOT 0.6 09/13/2015 1538         Impression and Plan: [name] is 70 year old white female with a  past history of infiltrating ductal carcinoma the right breast. She has stage I disease. She now is out from surgery and treatment by 18 years.  Her back looks okay. The laminectomy scar is healing nicely.  We will see what her vitamin D levels are. Maybe if the levels are low, we can get on a higher dose of vitamin D to try to help with her lower leg pain. I know this is from past breast cancer treatment or if this is from rheumatoid arthritis.  She still likes to see [place] yearly.   I have to believe that her cancer is cured.   Volanda Napoleon, MD 6/1/201712:51 PM

## 2015-12-25 ENCOUNTER — Telehealth: Payer: Self-pay | Admitting: Nurse Practitioner

## 2015-12-25 LAB — VITAMIN D 25 HYDROXY (VIT D DEFICIENCY, FRACTURES): VIT D 25 HYDROXY: 41.8 ng/mL (ref 30.0–100.0)

## 2015-12-25 NOTE — Telephone Encounter (Addendum)
Pt verbalized understanding and appreciation. ----- Message from Volanda Napoleon, MD sent at 12/25/2015  7:23 AM EDT ----- Call - vit D level is good!!  pete

## 2016-01-07 DIAGNOSIS — M0579 Rheumatoid arthritis with rheumatoid factor of multiple sites without organ or systems involvement: Secondary | ICD-10-CM | POA: Diagnosis not present

## 2016-02-05 DIAGNOSIS — I872 Venous insufficiency (chronic) (peripheral): Secondary | ICD-10-CM | POA: Diagnosis not present

## 2016-02-17 DIAGNOSIS — Z79899 Other long term (current) drug therapy: Secondary | ICD-10-CM | POA: Diagnosis not present

## 2016-02-17 DIAGNOSIS — M461 Sacroiliitis, not elsewhere classified: Secondary | ICD-10-CM | POA: Diagnosis not present

## 2016-02-29 DIAGNOSIS — I8312 Varicose veins of left lower extremity with inflammation: Secondary | ICD-10-CM | POA: Diagnosis not present

## 2016-02-29 DIAGNOSIS — I83893 Varicose veins of bilateral lower extremities with other complications: Secondary | ICD-10-CM | POA: Diagnosis not present

## 2016-02-29 DIAGNOSIS — I83813 Varicose veins of bilateral lower extremities with pain: Secondary | ICD-10-CM | POA: Diagnosis not present

## 2016-02-29 DIAGNOSIS — I8311 Varicose veins of right lower extremity with inflammation: Secondary | ICD-10-CM | POA: Diagnosis not present

## 2016-03-10 DIAGNOSIS — I83813 Varicose veins of bilateral lower extremities with pain: Secondary | ICD-10-CM | POA: Diagnosis not present

## 2016-03-10 DIAGNOSIS — I83893 Varicose veins of bilateral lower extremities with other complications: Secondary | ICD-10-CM | POA: Diagnosis not present

## 2016-03-10 DIAGNOSIS — I8311 Varicose veins of right lower extremity with inflammation: Secondary | ICD-10-CM | POA: Diagnosis not present

## 2016-03-10 DIAGNOSIS — I8312 Varicose veins of left lower extremity with inflammation: Secondary | ICD-10-CM | POA: Diagnosis not present

## 2016-03-11 DIAGNOSIS — M4302 Spondylolysis, cervical region: Secondary | ICD-10-CM | POA: Diagnosis not present

## 2016-03-11 DIAGNOSIS — R29898 Other symptoms and signs involving the musculoskeletal system: Secondary | ICD-10-CM | POA: Diagnosis not present

## 2016-03-15 DIAGNOSIS — I8312 Varicose veins of left lower extremity with inflammation: Secondary | ICD-10-CM | POA: Diagnosis not present

## 2016-03-15 DIAGNOSIS — I83893 Varicose veins of bilateral lower extremities with other complications: Secondary | ICD-10-CM | POA: Diagnosis not present

## 2016-03-15 DIAGNOSIS — I8311 Varicose veins of right lower extremity with inflammation: Secondary | ICD-10-CM | POA: Diagnosis not present

## 2016-03-25 DIAGNOSIS — M542 Cervicalgia: Secondary | ICD-10-CM | POA: Diagnosis not present

## 2016-03-25 DIAGNOSIS — R202 Paresthesia of skin: Secondary | ICD-10-CM | POA: Diagnosis not present

## 2016-03-29 DIAGNOSIS — G4733 Obstructive sleep apnea (adult) (pediatric): Secondary | ICD-10-CM | POA: Diagnosis not present

## 2016-03-29 DIAGNOSIS — I10 Essential (primary) hypertension: Secondary | ICD-10-CM | POA: Diagnosis not present

## 2016-03-29 DIAGNOSIS — Z23 Encounter for immunization: Secondary | ICD-10-CM | POA: Diagnosis not present

## 2016-03-29 DIAGNOSIS — Z6841 Body Mass Index (BMI) 40.0 and over, adult: Secondary | ICD-10-CM | POA: Diagnosis not present

## 2016-03-29 DIAGNOSIS — E1151 Type 2 diabetes mellitus with diabetic peripheral angiopathy without gangrene: Secondary | ICD-10-CM | POA: Diagnosis not present

## 2016-03-29 DIAGNOSIS — J45998 Other asthma: Secondary | ICD-10-CM | POA: Diagnosis not present

## 2016-03-29 DIAGNOSIS — M069 Rheumatoid arthritis, unspecified: Secondary | ICD-10-CM | POA: Diagnosis not present

## 2016-03-29 DIAGNOSIS — Z1389 Encounter for screening for other disorder: Secondary | ICD-10-CM | POA: Diagnosis not present

## 2016-04-12 DIAGNOSIS — I8312 Varicose veins of left lower extremity with inflammation: Secondary | ICD-10-CM | POA: Diagnosis not present

## 2016-04-12 DIAGNOSIS — I83893 Varicose veins of bilateral lower extremities with other complications: Secondary | ICD-10-CM | POA: Diagnosis not present

## 2016-04-12 DIAGNOSIS — I83813 Varicose veins of bilateral lower extremities with pain: Secondary | ICD-10-CM | POA: Diagnosis not present

## 2016-04-12 DIAGNOSIS — I8311 Varicose veins of right lower extremity with inflammation: Secondary | ICD-10-CM | POA: Diagnosis not present

## 2016-04-22 DIAGNOSIS — G5602 Carpal tunnel syndrome, left upper limb: Secondary | ICD-10-CM | POA: Diagnosis not present

## 2016-04-22 DIAGNOSIS — G5601 Carpal tunnel syndrome, right upper limb: Secondary | ICD-10-CM | POA: Diagnosis not present

## 2016-05-10 ENCOUNTER — Ambulatory Visit (INDEPENDENT_AMBULATORY_CARE_PROVIDER_SITE_OTHER): Payer: PPO | Admitting: Rheumatology

## 2016-05-10 DIAGNOSIS — M19041 Primary osteoarthritis, right hand: Secondary | ICD-10-CM

## 2016-05-10 DIAGNOSIS — M0579 Rheumatoid arthritis with rheumatoid factor of multiple sites without organ or systems involvement: Secondary | ICD-10-CM | POA: Diagnosis not present

## 2016-05-10 DIAGNOSIS — Z79899 Other long term (current) drug therapy: Secondary | ICD-10-CM | POA: Diagnosis not present

## 2016-05-10 DIAGNOSIS — R609 Edema, unspecified: Secondary | ICD-10-CM

## 2016-05-10 DIAGNOSIS — M542 Cervicalgia: Secondary | ICD-10-CM

## 2016-05-19 DIAGNOSIS — E119 Type 2 diabetes mellitus without complications: Secondary | ICD-10-CM | POA: Diagnosis not present

## 2016-05-19 DIAGNOSIS — Z79899 Other long term (current) drug therapy: Secondary | ICD-10-CM | POA: Diagnosis not present

## 2016-05-19 DIAGNOSIS — H04123 Dry eye syndrome of bilateral lacrimal glands: Secondary | ICD-10-CM | POA: Diagnosis not present

## 2016-05-20 ENCOUNTER — Encounter: Payer: Self-pay | Admitting: Rheumatology

## 2016-05-20 DIAGNOSIS — Z79899 Other long term (current) drug therapy: Secondary | ICD-10-CM | POA: Insufficient documentation

## 2016-05-24 DIAGNOSIS — I8312 Varicose veins of left lower extremity with inflammation: Secondary | ICD-10-CM | POA: Diagnosis not present

## 2016-05-24 DIAGNOSIS — I83892 Varicose veins of left lower extremities with other complications: Secondary | ICD-10-CM | POA: Diagnosis not present

## 2016-05-30 DIAGNOSIS — M05741 Rheumatoid arthritis with rheumatoid factor of right hand without organ or systems involvement: Secondary | ICD-10-CM | POA: Diagnosis not present

## 2016-05-30 DIAGNOSIS — M79641 Pain in right hand: Secondary | ICD-10-CM | POA: Diagnosis not present

## 2016-06-03 ENCOUNTER — Other Ambulatory Visit (INDEPENDENT_AMBULATORY_CARE_PROVIDER_SITE_OTHER): Payer: Self-pay | Admitting: Specialist

## 2016-06-03 DIAGNOSIS — I83892 Varicose veins of left lower extremities with other complications: Secondary | ICD-10-CM | POA: Diagnosis not present

## 2016-06-03 DIAGNOSIS — I8312 Varicose veins of left lower extremity with inflammation: Secondary | ICD-10-CM | POA: Diagnosis not present

## 2016-06-03 NOTE — Telephone Encounter (Signed)
Ok to refill 

## 2016-06-06 DIAGNOSIS — I83812 Varicose veins of left lower extremities with pain: Secondary | ICD-10-CM | POA: Diagnosis not present

## 2016-06-07 ENCOUNTER — Other Ambulatory Visit: Payer: Self-pay | Admitting: Internal Medicine

## 2016-06-07 DIAGNOSIS — Z1231 Encounter for screening mammogram for malignant neoplasm of breast: Secondary | ICD-10-CM

## 2016-06-15 ENCOUNTER — Encounter: Payer: Self-pay | Admitting: Gastroenterology

## 2016-06-24 DIAGNOSIS — I83812 Varicose veins of left lower extremities with pain: Secondary | ICD-10-CM | POA: Diagnosis not present

## 2016-06-24 DIAGNOSIS — I83892 Varicose veins of left lower extremities with other complications: Secondary | ICD-10-CM | POA: Diagnosis not present

## 2016-06-24 DIAGNOSIS — I8312 Varicose veins of left lower extremity with inflammation: Secondary | ICD-10-CM | POA: Diagnosis not present

## 2016-06-28 ENCOUNTER — Other Ambulatory Visit: Payer: Self-pay | Admitting: Rheumatology

## 2016-06-28 NOTE — Telephone Encounter (Signed)
Last Visit: 05/10/16 Next Visit due in March 2018 Message sent to front desk to schedule patient. Labs: 05/11/16 WNL  Okay to refill Folic Acid?

## 2016-07-05 DIAGNOSIS — I8312 Varicose veins of left lower extremity with inflammation: Secondary | ICD-10-CM | POA: Diagnosis not present

## 2016-07-09 ENCOUNTER — Other Ambulatory Visit (INDEPENDENT_AMBULATORY_CARE_PROVIDER_SITE_OTHER): Payer: Self-pay | Admitting: Specialist

## 2016-07-11 NOTE — Telephone Encounter (Signed)
Rx request, pls advise.  

## 2016-07-12 ENCOUNTER — Ambulatory Visit
Admission: RE | Admit: 2016-07-12 | Discharge: 2016-07-12 | Disposition: A | Discharge status: Home / Self Care | Payer: PPO | Source: Ambulatory Visit | Attending: Internal Medicine | Admitting: Internal Medicine

## 2016-07-12 DIAGNOSIS — Z1231 Encounter for screening mammogram for malignant neoplasm of breast: Secondary | ICD-10-CM

## 2016-08-02 DIAGNOSIS — I10 Essential (primary) hypertension: Secondary | ICD-10-CM | POA: Diagnosis not present

## 2016-08-02 DIAGNOSIS — E041 Nontoxic single thyroid nodule: Secondary | ICD-10-CM | POA: Diagnosis not present

## 2016-08-02 DIAGNOSIS — E1151 Type 2 diabetes mellitus with diabetic peripheral angiopathy without gangrene: Secondary | ICD-10-CM | POA: Diagnosis not present

## 2016-08-02 DIAGNOSIS — E784 Other hyperlipidemia: Secondary | ICD-10-CM | POA: Diagnosis not present

## 2016-08-03 DIAGNOSIS — I8312 Varicose veins of left lower extremity with inflammation: Secondary | ICD-10-CM | POA: Diagnosis not present

## 2016-08-03 DIAGNOSIS — I83892 Varicose veins of left lower extremities with other complications: Secondary | ICD-10-CM | POA: Diagnosis not present

## 2016-08-04 ENCOUNTER — Encounter: Payer: Self-pay | Admitting: Gastroenterology

## 2016-08-06 NOTE — Progress Notes (Deleted)
[name] Date of Birth: [birth year]   History of Present Illness: [name] is seen for preoperative clearance for lumbar laminectoy.  She is s/p CABG in 2005. Myoview in March 2015 was normal. When seen at that time she complained of dyspnea. Echo was unremarkable. She was seen by Dr. Elsworth Shelton and diagnosed with OSA by at home testing. She is currently using nocturnal oxygen.  [facility] and noted improvement in breathing and 9 lb weight loss. She then developed severe back pain and has not been able to exercise. No chest pain.  Current Outpatient Prescriptions on File Prior to Visit  Medication Sig Dispense Refill  . albuterol (PROVENTIL HFA;VENTOLIN HFA) 108 (90 BASE) MCG/ACT inhaler Inhale 2 puffs into the lungs every 4 (four) hours as needed for wheezing or shortness of breath (cough, shortness of breath or wheezing.). 1 Inhaler 12  . ALFALFA PO Take 2 tablets by mouth 2 (two) times daily.     Marland Kitchen amLODipine (NORVASC) 5 MG tablet Take 5 mg by mouth Daily.     Marland Kitchen aspirin 81 MG tablet Take 81 mg by mouth daily.      Marland Kitchen b complex vitamins tablet Take 1 tablet by mouth 2 (two) times daily.      . baclofen (LIORESAL) 10 MG tablet TAKE 1 TABLET BY MOUTH TWICE A DAY AS NEEDED FOR SPASMS 60 tablet 2  . benazepril (LOTENSIN) 20 MG tablet Take 20 mg by mouth daily.     . budesonide (PULMICORT) 0.5 MG/2ML nebulizer solution Take 0.5 mg by nebulization 2 (two) times daily.    Marland Kitchen buPROPion (WELLBUTRIN SR) 150 MG 12 hr tablet Take 150 mg by mouth daily.      . Cholecalciferol (VITAMIN D3) 2000 UNITS TABS Take 2,000 Units by mouth.    . DULoxetine (CYMBALTA) 30 MG capsule Take 60 mg by mouth daily.     . Flaxseed, Linseed, (FLAX SEED OIL PO) Take 1,000 mg by mouth 2 (two) times daily.      . folic acid (FOLVITE) 1 MG tablet TAKE 2 TABLETS BY MOUTH DAILY 180 tablet 3  . furosemide (LASIX) 20 MG tablet Take 20 mg by mouth as needed.      .  gabapentin (NEURONTIN) 300 MG capsule Take 1 capsule by mouth 3 (three) times daily.    Marland Kitchen gabapentin (NEURONTIN) 600 MG tablet TAKE 1 TABLET (600 MG TOTAL) BY MOUTH 3 (THREE) TIMES DAILY. 90 tablet 3  . hydrochlorothiazide 25 MG tablet Take 25 mg by mouth daily.      . hydroxychloroquine (PLAQUENIL) 200 MG tablet Take 1 tablet (200 mg total) by mouth daily. Mon-Fri 20 tablet 6  . loratadine (CLARITIN) 10 MG tablet Take 10 mg by mouth daily.    . meloxicam (MOBIC) 15 MG tablet Take 15 mg by mouth daily.      . methotrexate (50 MG/ML) 1 G injection Inject 0.8 mg into the vein every 7 (seven) days. Sunday    . Multiple Vitamin (MULTIVITAMIN) tablet Take 1 tablet by mouth daily.      . nebivolol (BYSTOLIC) 10 MG tablet Take 10 mg by mouth daily.      Marland Kitchen omeprazole (PRILOSEC) 20 MG capsule Take 20 mg by mouth daily.     Marland Kitchen POTASSIUM PO Take by mouth 2 (two) times daily.     . predniSONE (STERAPRED UNI-PAK 21 TAB) 10 MG (21) TBPK tablet Take 1 tablet (10  mg total) by mouth taper from 4 doses each day to 1 dose and stop. Take the remainder of the dose pak as written, days 3-6. 21 tablet 0  . VITAMIN E PO Take 200 mg by mouth daily.      No current facility-administered medications on file prior to visit.     Allergies  Allergen Reactions  . Statins Other (See Comments)    Joint pain and swelling/burning    Past Medical History:  Diagnosis Date  . Asthma    Albuterol inhaler as needed.Pulmicort neb as needed  . Asthma   . Breast cancer ([name])   . Chronic back pain    spinal stenosis  . Coronary artery disease   . Depression    takes CYmbalta and Wellbutrin daily  . Diabetes mellitus    not on any meds/controlled by diet  . Dyspnea    with exertion occasionally when lies down  . GERD (gastroesophageal reflux disease)    takes Omeprazole daily  . Headache    oocasionally  . History of kidney stones   . Hyperlipidemia    not on any meds  . Hypertension    takes Benazepril,Bystolic,and  Amlodipine daily  . IBS (irritable bowel syndrome)   . Joint pain   . Joint swelling   . Nocturia   . OA (osteoarthritis) of knee   . OSA (obstructive sleep apnea)   . Peripheral edema    takes daily as needed  . Peripheral neuropathy ([name])   . Pneumonia    hx of-several yrs ago  . RA (rheumatoid arthritis) ([name])   . Urinary frequency   . Urinary urgency   . Weakness    numbness and tingling mainly in left leg occasionally in right.Tingling/numbness in hands    Past Surgical History:  Procedure Laterality Date  . BREAST LUMPECTOMY     RIGHT BREAST  . CARDIAC CATHETERIZATION  04/23/2004   EF 60%  . cataract surgery Bilateral   . COLONOSCOPY    . CORONARY ARTERY BYPASS GRAFT  2005   LIMA GRAFT TO LAD, SAPHENOUS VEIN GRAFT TO THE FIRST DIAGONAL, AND LEFT RADIAL ARTERY GRAFT TO THE OM  . FOOT SURGERY Right   . JOINT REPLACEMENT    . LITHOTRIPSY    . LUMBAR LAMINECTOMY/DECOMPRESSION MICRODISCECTOMY N/A 09/08/2015   Procedure: CENTRAL DECOMPRESSIVE LUMBAR LAMINECTOMIES L3-4, L4-5 AND BILATERAL HEMILAMINECTOMY L5-S1;  Surgeon: Jamie Oto, MD;  Location: [facility];  Service: Orthopedics;  Laterality: N/A;  . nodule removed from left elbow    . TOTAL KNEE ARTHROPLASTY Bilateral   . TRIPLE BYPASS  04/27/05  . US ECHOCARDIOGRAPHY  01/06/2009   EF 55-60%  . WRIST SURGERY Left     History  Smoking Status  . Never Smoker  Smokeless Tobacco  . Not on file    History  Alcohol Use No    Family History  Problem Relation Age of Onset  . Heart disease Mother   . Cancer Father   . Cancer Brother   . Heart disease Brother     Review of Systems: As noted history of present illness.  All other systems were reviewed and are negative.  Physical Exam: There were no vitals taken for this visit. She is a pleasant obese white female in no acute distress. Her HEENT exam is unremarkable. She is normocephalic, atraumatic. Pupils are equal round and reactive to light and accommodation.  Extraocular movements are full. Oropharynx is clear. Neck is supple without JVD, adenopathy,  thyromegaly, or bruits. Lungs are clear. Cardiac exam reveals a regular rate and rhythm without gallop, murmur, or click. Abdomen is obese, soft, nontender. She has trace lower extremity edema. She has arthritic changes in her knees. She is alert and oriented x3. Cranial nerves II through XII are intact.  LABORATORY DATA: Lab Results  Component Value Date   WBC 7.9 12/24/2015   HGB 13.1 12/24/2015   HCT 39.8 12/24/2015   PLT 196 12/24/2015   GLUCOSE 117 12/24/2015   ALT 21 12/24/2015   AST 19 12/24/2015   NA 142 12/24/2015   K 4.2 12/24/2015   CL 96 (L) 09/13/2015   CREATININE 0.7 12/24/2015   BUN 16.3 12/24/2015   CO2 29 12/24/2015   INR 1.07 04/08/2011   HGBA1C 6.2 (H) 09/03/2015    ECG today shows normal sinus rhythm.  Rate 67. ST- T changes consistent to anterior ischemia. Unchanged from before.I have personally reviewed and interpreted this study.  Echo: 2/16/15Study Conclusions  - Left ventricle: The cavity size was normal. Wall thickness was normal. Systolic function was normal. The estimated ejection fraction was in the range of 50% to 55%. - Mitral valve: Nodular calcification of anterior leaflet tip Mild regurgitation. - Left atrium: The atrium was moderately dilated. - Atrial septum: No defect or patent foramen ovale was identified. - Pericardium, extracardiac: A trivial pericardial effusion was identified posterior to the heart. - Impressions: Elevated E/E' ratio suggested diastolic dysfunction with elevated EDP Impressions:  - Elevated E/E' ratio suggested diastolic dysfunction with elevated EDP  Cardiology Nuclear Med Study  [name] is a 71 y.o. female MRN : [id number] DOB: [birth year]  Procedure Date: 09/24/2013  Nuclear Med Background Indication for Stress Test: Evaluation for Ischemia and Graft Patency History: CABG, '10 Echo:  EF=55-60%, and 2011 Myocardial Perfusion Imaging-Normal, EF=69% Cardiac Risk Factors: Family History - CAD, Hypertension, Lipids and NIDDM  Symptoms: Chest Pain with/without exertion (last occurrence 3-4 weeks ago), Dizziness, DOE, Palpitations and SOB   Nuclear Pre-Procedure Caffeine/Decaff Intake: None > 12 hrs NPO After: 7:00am   Lungs: clear O2 Sat: 96% on room air. IV 0.9% NS with Angio Cath: 22g  IV Site: L Forearm x 1, tolerated well IV Started by: Irven Baltimore, RN  Chest Size (in): 42 Cup Size: D  Height: 5\' 5"  (1.651 m)  Weight: 258 lb (117.028 kg)  BMI: Body mass index is 42.93 kg/(m^2). 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    Nuclear Med Study 1 or 2 day study: 2 day  Stress Test Type: Lexiscan  Reading MD: N/A  Order Authorizing Provider: Peter Martinique, MD  Resting Radionuclide: Technetium 50m Sestamibi  Resting Radionuclide Dose: 33.0 mCi ON  09-30-13  Stress Radionuclide: Technetium 63m Sestamibi  Stress Radionuclide Dose: 33.0 mCi ON  09-24-13     Stress Protocol Rest HR: 58 Stress HR: 74  Rest BP: 98/60 Stress BP: 117/52  Exercise Time (min): n/a METS: n/a   Predicted Max HR: 152 bpm % Max HR: 48.68 bpm Rate Pressure Product: 8658   Dose of Adenosine (mg): n/a Dose of Lexiscan: 0.4 mg  Dose of Atropine (mg): n/a Dose of Dobutamine: n/a mcg/kg/min (at max HR)  Stress Test Technologist: Irven Baltimore, RN  Nuclear Technologist: Charlton Amor, CNMT     Rest Procedure: Myocardial perfusion imaging was performed at rest 45 minutes following the intravenous administration of Technetium 58m Sestamibi. Rest ECG: NSR with non-specific ST-T wave changes  Stress Procedure: The patient received IV Lexiscan 0.4 mg  over 15-seconds. Technetium 86m Sestamibi injected at 30-seconds. The patient complained of SOB, Head feeling funny, and Chest Pain with Lexiscan. Quantitative spect images were obtained after a 45  minute delay. Stress ECG: No significant change from baseline ECG  QPS Raw Data Images: Soft tissue (diaphragm, breast, bowel activity. ) surround heart.  Stress Images: Apical thinning and very mild thinning in the inferolateral bse. Otherwide normap perfusion.  Rest Images: No significant changes from the stress images.  Subtraction (SDS): No evidence of ischemia. Transient Ischemic Dilatation (Normal <1.22): 0.99 Lung/Heart Ratio (Normal <0.45): 0.33  Quantitative Gated Spect Images QGS EDV: 127 ml QGS ESV: 50 ml  Impression Exercise Capacity: Lexiscan with no exercise. BP Response: Normal blood pressure response. Clinical Symptoms: Mild chest pain/dyspnea. ECG Impression: No significant ST segment change suggestive of ischemia. Comparison with Prior Nuclear Study: Minimal change in the inferolateral base.   Overall Impression: Probable normal perfusion and soft tissue attenuation No significant ischemia or scar.   LV Ejection Fraction: 61%. LV Wall Motion: NL LV Function; NL Wall Motion     Assessment / Plan: 1. Coronary disease.  She is status post CABG in 2005. Nuclear stress test in March 2015 was normal. No chest pain. Continue medical therapy. She is cleared for lumbar laminectomy from a cardiac standpoint.   2. Hypertension, controlled.  3. Hyperlipidemia.  4. Diabetes mellitus type 2.  5. Morbid obesity with OSA, 9 lb weight loss.  6. OSA   I will follow up in one year.

## 2016-08-11 ENCOUNTER — Ambulatory Visit: Payer: PPO | Admitting: Cardiology

## 2016-08-12 ENCOUNTER — Other Ambulatory Visit: Payer: Self-pay | Admitting: Rheumatology

## 2016-08-15 DIAGNOSIS — I83891 Varicose veins of right lower extremities with other complications: Secondary | ICD-10-CM | POA: Diagnosis not present

## 2016-08-15 DIAGNOSIS — I8311 Varicose veins of right lower extremity with inflammation: Secondary | ICD-10-CM | POA: Diagnosis not present

## 2016-08-15 NOTE — Telephone Encounter (Signed)
Last visit and labs 05/10/16 09/27/16 next visit Called pt to check on her eye exam left message for her to call back with date of eye exam Ok to refill per Dr Estanislado Pandy

## 2016-08-16 ENCOUNTER — Other Ambulatory Visit: Payer: Self-pay | Admitting: *Deleted

## 2016-08-16 DIAGNOSIS — I8311 Varicose veins of right lower extremity with inflammation: Secondary | ICD-10-CM | POA: Diagnosis not present

## 2016-08-16 DIAGNOSIS — Z79899 Other long term (current) drug therapy: Secondary | ICD-10-CM | POA: Diagnosis not present

## 2016-08-16 LAB — COMPLETE METABOLIC PANEL WITH GFR
ALBUMIN: 4 g/dL (ref 3.6–5.1)
ALT: 20 U/L (ref 6–29)
AST: 23 U/L (ref 10–35)
Alkaline Phosphatase: 43 U/L (ref 33–130)
BUN: 17 mg/dL (ref 7–25)
CHLORIDE: 103 mmol/L (ref 98–110)
CO2: 26 mmol/L (ref 20–31)
Calcium: 8.8 mg/dL (ref 8.6–10.4)
Creat: 0.65 mg/dL (ref 0.60–0.93)
GFR, Est African American: >89 mL/min (ref >=60)
GFR, Est Non African American: >89 mL/min (ref >=60)
GLUCOSE: 156 mg/dL — AB (ref 65–99)
POTASSIUM: 4.2 mmol/L (ref 3.5–5.3)
SODIUM: 138 mmol/L (ref 135–146)
Total Bilirubin: 0.5 mg/dL (ref 0.2–1.2)
Total Protein: 6.5 g/dL (ref 6.1–8.1)

## 2016-08-16 LAB — CBC WITH DIFFERENTIAL/PLATELET
BASOS PCT: 0 %
Basophils Absolute: 0 cells/uL (ref 0–200)
EOS PCT: 1 %
Eosinophils Absolute: 76 cells/uL (ref 15–500)
HCT: 39.6 % (ref 35.0–45.0)
HEMOGLOBIN: 13 g/dL (ref 11.7–15.5)
LYMPHS ABS: 2280 {cells}/uL (ref 850–3900)
Lymphocytes Relative: 30 %
MCH: 30.4 pg (ref 27.0–33.0)
MCHC: 32.8 g/dL (ref 32.0–36.0)
MCV: 92.7 fL (ref 80.0–100.0)
MPV: 10.2 fL (ref 7.5–12.5)
Monocytes Absolute: 684 cells/uL (ref 200–950)
Monocytes Relative: 9 %
NEUTROS PCT: 60 %
Neutro Abs: 4560 cells/uL (ref 1500–7800)
Platelets: 201 10*3/uL (ref 140–400)
RBC: 4.27 MIL/uL (ref 3.80–5.10)
RDW: 15.3 % — ABNORMAL HIGH (ref 11.0–15.0)
WBC: 7.6 10*3/uL (ref 3.8–10.8)

## 2016-08-16 NOTE — Progress Notes (Signed)
Labs within normal limits

## 2016-08-24 ENCOUNTER — Ambulatory Visit (INDEPENDENT_AMBULATORY_CARE_PROVIDER_SITE_OTHER): Payer: PPO | Admitting: Podiatry

## 2016-08-24 DIAGNOSIS — L84 Corns and callosities: Secondary | ICD-10-CM | POA: Diagnosis not present

## 2016-08-24 DIAGNOSIS — M779 Enthesopathy, unspecified: Secondary | ICD-10-CM

## 2016-08-24 MED ORDER — TRIAMCINOLONE ACETONIDE 10 MG/ML IJ SUSP
10.0000 mg | Freq: Once | INTRAMUSCULAR | Status: AC
  Administered 2016-08-24: 10 mg

## 2016-08-25 DIAGNOSIS — I8311 Varicose veins of right lower extremity with inflammation: Secondary | ICD-10-CM | POA: Diagnosis not present

## 2016-08-25 DIAGNOSIS — I83891 Varicose veins of right lower extremities with other complications: Secondary | ICD-10-CM | POA: Diagnosis not present

## 2016-08-25 NOTE — Progress Notes (Signed)
Subjective:     Patient ID: [name], female   DOB: [birth year], 71 y.o.   MRN: [id number]  HPI patient presents stating this place on her left foot has become inflamed and it's making it difficult for her to walk and it's been several years   Review of Systems     Objective:   Physical Exam Neurovascular status intact with inflammation sub-fifth metatarsal left with fluid buildup around the metatarsophalangeal joint pain and keratotic lesion formation    Assessment:     Plantarflexed metatarsal fifth left with inflammatory capsulitis and keratotic lesion formation    Plan:     Careful injection administered to the fifth MPJ to reduce plantar inflammation capsulitis and after appropriate numbness debridement of lesion accomplished with no iatrogenic bleeding noted.

## 2016-08-26 DIAGNOSIS — E1151 Type 2 diabetes mellitus with diabetic peripheral angiopathy without gangrene: Secondary | ICD-10-CM | POA: Diagnosis not present

## 2016-08-26 DIAGNOSIS — Z23 Encounter for immunization: Secondary | ICD-10-CM | POA: Diagnosis not present

## 2016-08-26 DIAGNOSIS — G6289 Other specified polyneuropathies: Secondary | ICD-10-CM | POA: Diagnosis not present

## 2016-08-26 DIAGNOSIS — Z6841 Body Mass Index (BMI) 40.0 and over, adult: Secondary | ICD-10-CM | POA: Diagnosis not present

## 2016-08-26 DIAGNOSIS — M79601 Pain in right arm: Secondary | ICD-10-CM | POA: Diagnosis not present

## 2016-08-26 DIAGNOSIS — Z1389 Encounter for screening for other disorder: Secondary | ICD-10-CM | POA: Diagnosis not present

## 2016-08-26 DIAGNOSIS — E784 Other hyperlipidemia: Secondary | ICD-10-CM | POA: Diagnosis not present

## 2016-08-26 DIAGNOSIS — Z0000 Encounter for general adult medical examination without abnormal findings: Secondary | ICD-10-CM | POA: Diagnosis not present

## 2016-08-26 DIAGNOSIS — I2510 Atherosclerotic heart disease of native coronary artery without angina pectoris: Secondary | ICD-10-CM | POA: Diagnosis not present

## 2016-08-26 DIAGNOSIS — R413 Other amnesia: Secondary | ICD-10-CM | POA: Diagnosis not present

## 2016-08-26 DIAGNOSIS — C50919 Malignant neoplasm of unspecified site of unspecified female breast: Secondary | ICD-10-CM | POA: Diagnosis not present

## 2016-08-26 DIAGNOSIS — G4733 Obstructive sleep apnea (adult) (pediatric): Secondary | ICD-10-CM | POA: Diagnosis not present

## 2016-08-26 DIAGNOSIS — E041 Nontoxic single thyroid nodule: Secondary | ICD-10-CM | POA: Diagnosis not present

## 2016-08-31 DIAGNOSIS — Z1212 Encounter for screening for malignant neoplasm of rectum: Secondary | ICD-10-CM | POA: Diagnosis not present

## 2016-09-06 DIAGNOSIS — J45909 Unspecified asthma, uncomplicated: Secondary | ICD-10-CM | POA: Diagnosis not present

## 2016-09-06 DIAGNOSIS — R413 Other amnesia: Secondary | ICD-10-CM | POA: Diagnosis not present

## 2016-09-06 DIAGNOSIS — F329 Major depressive disorder, single episode, unspecified: Secondary | ICD-10-CM | POA: Diagnosis not present

## 2016-09-08 DIAGNOSIS — I83891 Varicose veins of right lower extremities with other complications: Secondary | ICD-10-CM | POA: Diagnosis not present

## 2016-09-08 DIAGNOSIS — I8311 Varicose veins of right lower extremity with inflammation: Secondary | ICD-10-CM | POA: Diagnosis not present

## 2016-09-11 NOTE — Progress Notes (Signed)
Juline Patch Klumpp Date of Birth: July 30, 1945   History of Present Illness: Mrs. Borell is seen for follow up CAD.  She is s/p CABG in 2005. Myoview in March 2015 was normal. When seen at that time she complained of dyspnea. Echo was unremarkable. She was seen by Dr. Elsworth Soho and diagnosed with OSA by at home testing. She is currently using nocturnal oxygen.  Last year she underwent lumbar laminectomy complicated by a dural tear that was repaired. Since that time she has continued to have back pain that limits her activity. She cannot walk or ride a stationary bike long without aggravating her back pain. She does note DOE. She also has some chest pain at times that is sporadic. Only has taken 2 sl Ntg. She has been prescribed a PCSK 9 inhibitor by Dr. Joylene Draft but hasn't started yet. She is statin intolerant. She also has been undergoing laser treatment for LE varicose veins.    Current Outpatient Prescriptions on File Prior to Visit  Medication Sig Dispense Refill  . albuterol (PROVENTIL HFA;VENTOLIN HFA) 108 (90 BASE) MCG/ACT inhaler Inhale 2 puffs into the lungs every 4 (four) hours as needed for wheezing or shortness of breath (cough, shortness of breath or wheezing.). 1 Inhaler 12  . amLODipine (NORVASC) 5 MG tablet Take 5 mg by mouth Daily.     Marland Kitchen aspirin 81 MG tablet Take 81 mg by mouth daily.      Marland Kitchen b complex vitamins tablet Take 1 tablet by mouth 2 (two) times daily.      . baclofen (LIORESAL) 10 MG tablet TAKE 1 TABLET BY MOUTH TWICE A DAY AS NEEDED FOR SPASMS 60 tablet 2  . benazepril (LOTENSIN) 20 MG tablet Take 20 mg by mouth daily.     . budesonide (PULMICORT) 0.5 MG/2ML nebulizer solution Take 0.5 mg by nebulization 2 (two) times daily.    Marland Kitchen buPROPion (WELLBUTRIN SR) 150 MG 12 hr tablet Take 150 mg by mouth daily.      . Cholecalciferol (VITAMIN D3) 2000 UNITS TABS Take 2,000 Units by mouth.    . [employer] ALCOHOL SWABS PADS WIPE AREA WITH ALCOHOL PAD PRIOR TO GIVING METHOTREXATE INJECTION 100 each  0  . DULoxetine (CYMBALTA) 30 MG capsule Take 60 mg by mouth daily.     . Flaxseed, Linseed, (FLAX SEED OIL PO) Take 1,000 mg by mouth 2 (two) times daily.      . folic acid (FOLVITE) 1 MG tablet TAKE 2 TABLETS BY MOUTH DAILY 180 tablet 3  . furosemide (LASIX) 20 MG tablet Take 20 mg by mouth as needed.      . gabapentin (NEURONTIN) 300 MG capsule Take 600 mg by mouth 3 (three) times daily.     . hydrochlorothiazide 25 MG tablet Take 25 mg by mouth daily.      . hydroxychloroquine (PLAQUENIL) 200 MG tablet TAKE 1 TABLET BY MOUTH TWICE A DAY MONDAY THROUGH FRIDAY 120 tablet 1  . loratadine (CLARITIN) 10 MG tablet Take 10 mg by mouth daily.    . meloxicam (MOBIC) 15 MG tablet Take 15 mg by mouth daily.      . methotrexate (50 MG/ML) 1 G injection Inject 0.8 mg into the vein every 7 (seven) days. Sunday    . Multiple Vitamin (MULTIVITAMIN) tablet Take 1 tablet by mouth daily.      . nebivolol (BYSTOLIC) 10 MG tablet Take 10 mg by mouth daily.      Marland Kitchen omeprazole (PRILOSEC) 20 MG capsule Take  20 mg by mouth daily.     Marland Kitchen POTASSIUM PO Take by mouth 2 (two) times daily.     . predniSONE (STERAPRED UNI-PAK 21 TAB) 10 MG (21) TBPK tablet Take 1 tablet (10 mg total) by mouth taper from 4 doses each day to 1 dose and stop. Take the remainder of the dose pak as written, days 3-6. 21 tablet 0  . VITAMIN E PO Take 200 mg by mouth daily.      No current facility-administered medications on file prior to visit.     Allergies  Allergen Reactions  . Statins Other (See Comments)    Joint pain and swelling/burning    Past Medical History:  Diagnosis Date  . Asthma    Albuterol inhaler as needed.Pulmicort neb as needed  . Asthma   . Breast cancer ([place])   . Chronic back pain    spinal stenosis  . Coronary artery disease   . Depression    takes CYmbalta and Wellbutrin daily  . Diabetes mellitus    not on any meds/controlled by diet  . Dyspnea    with exertion occasionally when lies down  . GERD  (gastroesophageal reflux disease)    takes Omeprazole daily  . Headache    oocasionally  . History of kidney stones   . Hyperlipidemia    not on any meds  . Hypertension    takes Benazepril,Bystolic,and Amlodipine daily  . IBS (irritable bowel syndrome)   . Joint pain   . Joint swelling   . Nocturia   . OA (osteoarthritis) of knee   . OSA (obstructive sleep apnea)   . Peripheral edema    takes daily as needed  . Peripheral neuropathy ([place])   . Pneumonia    hx of-several yrs ago  . RA (rheumatoid arthritis) ([place])   . Urinary frequency   . Urinary urgency   . Weakness    numbness and tingling mainly in left leg occasionally in right.Tingling/numbness in hands    Past Surgical History:  Procedure Laterality Date  . BREAST LUMPECTOMY     RIGHT BREAST  . CARDIAC CATHETERIZATION  04/23/2004   EF 60%  . cataract surgery Bilateral   . COLONOSCOPY    . CORONARY ARTERY BYPASS GRAFT  2005   LIMA GRAFT TO LAD, SAPHENOUS VEIN GRAFT TO THE FIRST DIAGONAL, AND LEFT RADIAL ARTERY GRAFT TO THE OM  . FOOT SURGERY Right   . JOINT REPLACEMENT    . LITHOTRIPSY    . LUMBAR LAMINECTOMY/DECOMPRESSION MICRODISCECTOMY N/A 09/08/2015   Procedure: CENTRAL DECOMPRESSIVE LUMBAR LAMINECTOMIES L3-4, L4-5 AND BILATERAL HEMILAMINECTOMY L5-S1;  Surgeon: Jessy Oto, MD;  Location: [facility];  Service: Orthopedics;  Laterality: N/A;  . nodule removed from left elbow    . TOTAL KNEE ARTHROPLASTY Bilateral   . TRIPLE BYPASS  04/27/05  . US ECHOCARDIOGRAPHY  01/06/2009   EF 55-60%  . WRIST SURGERY Left     History  Smoking Status  . Never Smoker  Smokeless Tobacco  . Not on file    History  Alcohol Use No    Family History  Problem Relation Age of Onset  . Heart disease Mother   . Cancer Father   . Cancer Brother   . Heart disease Brother     Review of Systems: As noted history of present illness.  All other systems were reviewed and are negative.  Physical Exam: BP (!) 150/88 (BP  Location: Left Arm, Patient Position: Sitting, Cuff Size: Large)  Pulse 70   Ht 5\' 4"  (1.626 m)   Wt 259 lb (117.5 kg)   BMI 44.46 kg/m  She is a pleasant obese white female in no acute distress. Her HEENT exam is unremarkable. She is normocephalic, atraumatic. Pupils are equal round and reactive to light and accommodation. Extraocular movements are full. Oropharynx is clear. Neck is supple without JVD, adenopathy, thyromegaly, or bruits. Lungs are clear. Cardiac exam reveals a regular rate and rhythm without gallop, murmur, or click. Abdomen is obese, soft, nontender. She has trace lower extremity edema. She has arthritic changes in her knees. She is alert and oriented x3. Cranial nerves II through XII are intact.  LABORATORY DATA:  Lab Results  Component Value Date   WBC 7.6 08/16/2016   HGB 13.0 08/16/2016   HCT 39.6 08/16/2016   PLT 201 08/16/2016   GLUCOSE 156 (H) 08/16/2016   ALT 20 08/16/2016   AST 23 08/16/2016   NA 138 08/16/2016   K 4.2 08/16/2016   CL 103 08/16/2016   CREATININE 0.65 08/16/2016   BUN 17 08/16/2016   CO2 26 08/16/2016   INR 1.07 04/08/2011   HGBA1C 6.2 (H) 09/03/2015   Labs dated 08/02/16: cholesterol 286, triglycerides 181, HDL 52, LDL 198, A1c 6.5%.  ECG today shows normal sinus rhythm.  Rate 70. Nonspecific ST abnormality. Occ. PVC. Unchanged from before.I have personally reviewed and interpreted this study.  Echo: 2/16/15Study Conclusions  - Left ventricle: The cavity size was normal. Wall thickness was normal. Systolic function was normal. The estimated ejection fraction was in the range of 50% to 55%. - Mitral valve: Nodular calcification of anterior leaflet tip Mild regurgitation. - Left atrium: The atrium was moderately dilated. - Atrial septum: No defect or patent foramen ovale was identified. - Pericardium, extracardiac: A trivial pericardial effusion was identified posterior to the heart. - Impressions: Elevated E/E' ratio  suggested diastolic dysfunction with elevated EDP Impressions:  - Elevated E/E' ratio suggested diastolic dysfunction with elevated EDP  Cardiology Nuclear Med Study  LO PAAP is a 71 y.o. female MRN : [id number] DOB: [birth year]  Procedure Date: 09/24/2013  Nuclear Med Background Indication for Stress Test: Evaluation for Ischemia and Graft Patency History: CABG, '10 Echo: EF=55-60%, and 2011 Myocardial Perfusion Imaging-Normal, EF=69% Cardiac Risk Factors: Family History - CAD, Hypertension, Lipids and NIDDM  Symptoms: Chest Pain with/without exertion (last occurrence 3-4 weeks ago), Dizziness, DOE, Palpitations and SOB   Nuclear Pre-Procedure Caffeine/Decaff Intake: None > 12 hrs NPO After: 7:00am   Lungs: clear O2 Sat: 96% on room air. IV 0.9% NS with Angio Cath: 22g  IV Site: L Forearm x 1, tolerated well IV Started by: Irven Baltimore, RN  Chest Size (in): 42 Cup Size: D  Height: 5\' 5"  (1.651 m)  Weight: 258 lb (117.028 kg)  BMI: Body mass index is 42.93 kg/(m^2). 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    Nuclear Med Study 1 or 2 day study: 2 day  Stress Test Type: Lexiscan  Reading MD: N/A  Order Authorizing Provider: Gilford Lardizabal Martinique, MD  Resting Radionuclide: Technetium 43m Sestamibi  Resting Radionuclide Dose: 33.0 mCi ON  09-30-13  Stress Radionuclide: Technetium 21m Sestamibi  Stress Radionuclide Dose: 33.0 mCi ON  09-24-13     Stress Protocol Rest HR: 58 Stress HR: 74  Rest BP: 98/60 Stress BP: 117/52  Exercise Time (min): n/a METS: n/a   Predicted Max HR: 152 bpm % Max HR: 48.68 bpm Rate Pressure Product: 8658   Dose  of Adenosine (mg): n/a Dose of Lexiscan: 0.4 mg  Dose of Atropine (mg): n/a Dose of Dobutamine: n/a mcg/kg/min (at max HR)  Stress Test Technologist: Irven Baltimore, RN  Nuclear Technologist: Charlton Amor, CNMT     Rest Procedure: Myocardial perfusion imaging was  performed at rest 45 minutes following the intravenous administration of Technetium 45m Sestamibi. Rest ECG: NSR with non-specific ST-T wave changes  Stress Procedure: The patient received IV Lexiscan 0.4 mg over 15-seconds. Technetium 71m Sestamibi injected at 30-seconds. The patient complained of SOB, Head feeling funny, and Chest Pain with Lexiscan. Quantitative spect images were obtained after a 45 minute delay. Stress ECG: No significant change from baseline ECG  QPS Raw Data Images: Soft tissue (diaphragm, breast, bowel activity. ) surround heart.  Stress Images: Apical thinning and very mild thinning in the inferolateral bse. Otherwide normap perfusion.  Rest Images: No significant changes from the stress images.  Subtraction (SDS): No evidence of ischemia. Transient Ischemic Dilatation (Normal <1.22): 0.99 Lung/Heart Ratio (Normal <0.45): 0.33  Quantitative Gated Spect Images QGS EDV: 127 ml QGS ESV: 50 ml  Impression Exercise Capacity: Lexiscan with no exercise. BP Response: Normal blood pressure response. Clinical Symptoms: Mild chest pain/dyspnea. ECG Impression: No significant ST segment change suggestive of ischemia. Comparison with Prior Nuclear Study: Minimal change in the inferolateral base.   Overall Impression: Probable normal perfusion and soft tissue attenuation No significant ischemia or scar.   LV Ejection Fraction: 61%. LV Wall Motion: NL LV Function; NL Wall Motion     Assessment / Plan: 1. Coronary disease.  She is status post CABG in 2005. Nuclear stress test in March 2015 was normal. She does have DOE and some atypical chest pain. Will update a lexiscan myoview.  Continue medical therapy.   2. Hypertension, mildly elevated. She reports otherwise good control.  3. Hyperlipidemia. Severely elevated LDL. Statin intolerant. Encourage PCSK 9 inhibitor.  4. Diabetes mellitus type 2.  5. Morbid obesity with OSA  6. OSA

## 2016-09-13 ENCOUNTER — Ambulatory Visit (INDEPENDENT_AMBULATORY_CARE_PROVIDER_SITE_OTHER): Payer: PPO | Admitting: Cardiology

## 2016-09-13 VITALS — BP 150/88 | HR 70 | Ht 64.0 in | Wt 259.0 lb

## 2016-09-13 DIAGNOSIS — I2510 Atherosclerotic heart disease of native coronary artery without angina pectoris: Secondary | ICD-10-CM

## 2016-09-13 DIAGNOSIS — I10 Essential (primary) hypertension: Secondary | ICD-10-CM

## 2016-09-13 DIAGNOSIS — E7800 Pure hypercholesterolemia, unspecified: Secondary | ICD-10-CM

## 2016-09-13 DIAGNOSIS — R0609 Other forms of dyspnea: Secondary | ICD-10-CM | POA: Diagnosis not present

## 2016-09-13 NOTE — Patient Instructions (Signed)
Continue your current medication  Give the new cholesterol medication a try  We will schedule you for a nuclear stress test

## 2016-09-14 DIAGNOSIS — Z96653 Presence of artificial knee joint, bilateral: Secondary | ICD-10-CM | POA: Insufficient documentation

## 2016-09-14 DIAGNOSIS — M47816 Spondylosis without myelopathy or radiculopathy, lumbar region: Secondary | ICD-10-CM | POA: Insufficient documentation

## 2016-09-14 DIAGNOSIS — M47812 Spondylosis without myelopathy or radiculopathy, cervical region: Secondary | ICD-10-CM | POA: Insufficient documentation

## 2016-09-14 DIAGNOSIS — E559 Vitamin D deficiency, unspecified: Secondary | ICD-10-CM | POA: Insufficient documentation

## 2016-09-14 NOTE — Progress Notes (Signed)
Office Visit Note  Patient: [name]             Date of Birth: 27-Feb-1946           MRN: [id number]             PCP: Jerlyn Ly, MD Referring: Crist Infante, MD Visit Date: 09/27/2016 Occupation: @GUAROCC @    Subjective:  Neck and lower back pain.   History of Present Illness: [name] is a 71 y.o. female with history of sero negative rheumatoid arthritis. She states she has intermittent discomfort in her bilateral hands and some swelling. None of the other joints have been in full. She continues to have discomfort in her neck and lower back. Her knee joints are doing well which is been replaced.   Activities of Daily Living:  Patient reports morning stiffness for 2 hours.   Patient Reports nocturnal pain.  Difficulty dressing/grooming: Denies Difficulty climbing stairs: Reports Difficulty getting out of chair: Reports Difficulty using hands for taps, buttons, cutlery, and/or writing: Reports   Review of Systems  Constitutional: Positive for fatigue. Negative for night sweats, weight gain, weight loss and weakness.  HENT: Negative for mouth sores, trouble swallowing, trouble swallowing, mouth dryness and nose dryness.   Eyes: Negative for pain, redness, visual disturbance and dryness.  Respiratory: Positive for shortness of breath. Negative for cough and difficulty breathing.        Exertion  Cardiovascular: Negative for chest pain, palpitations, hypertension, irregular heartbeat and swelling in legs/feet.  Gastrointestinal: Negative for blood in stool, constipation and diarrhea.  Endocrine: Negative for increased urination.  Genitourinary: Negative for vaginal dryness.  Musculoskeletal: Positive for arthralgias, joint pain, joint swelling, myalgias, morning stiffness and myalgias. Negative for muscle weakness and muscle tenderness.  Skin: Negative for color change, rash, hair loss, skin tightness, ulcers and sensitivity to sunlight.  Allergic/Immunologic:  Negative for susceptible to infections.  Neurological: Negative for dizziness, memory loss and night sweats.  Hematological: Negative for swollen glands.  Psychiatric/Behavioral: Positive for depressed mood and sleep disturbance. The patient is nervous/anxious.     PMFS History:  Patient Active Problem List   Diagnosis Date Noted  . Vitamin D deficiency 09/14/2016  . History of total knee replacement, bilateral 09/14/2016  . DJD (degenerative joint disease), cervical 09/14/2016  . 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 High risk medication use 05/20/2016  . Spinal stenosis, lumbar region, with neurogenic claudication 09/08/2015    Class: Chronic  . OSA (obstructive sleep apnea) 10/17/2013  . Dyspnea 09/04/2013  . Atypical chest pain 09/04/2013  . Morbid obesity ([place]) 03/21/2012  . Cancer of lower-outer quadrant of female breast ([place]) 03/15/2012  . Diabetes mellitus type II 02/22/2011  . Coronary artery disease   . Hypertension   . Hyperlipidemia   . OA (osteoarthritis) of knee     Past Medical History:  Diagnosis Date  . Asthma    Albuterol inhaler as needed.Pulmicort neb as needed  . Asthma   . Breast cancer ([place])   . Chronic back pain    spinal stenosis  . Coronary artery disease   . Depression    takes CYmbalta and Wellbutrin daily  . Diabetes mellitus    not on any meds/controlled by diet  . Dyspnea    with exertion occasionally when lies down  . GERD (gastroesophageal reflux disease)    takes Omeprazole daily  . Headache    oocasionally  . History of kidney stones   .  Hyperlipidemia    not on any meds  . Hypertension    takes Benazepril,Bystolic,and Amlodipine daily  . IBS (irritable bowel syndrome)   . Joint pain   . Joint swelling   . Nocturia   . OA (osteoarthritis) of knee   . OSA (obstructive sleep apnea)   . Peripheral edema    takes daily as needed  . Peripheral neuropathy ([place])   . Pneumonia    hx  of-several yrs ago  . RA (rheumatoid arthritis) ([place])   . Urinary frequency   . Urinary urgency   . Weakness    numbness and tingling mainly in left leg occasionally in right.Tingling/numbness in hands    Family History  Problem Relation Age of Onset  . Heart disease Mother   . Cancer Father   . Cancer Brother   . Heart disease Brother    Past Surgical History:  Procedure Laterality Date  . BREAST LUMPECTOMY     RIGHT BREAST  . CARDIAC CATHETERIZATION  04/23/2004   EF 60%  . cataract surgery Bilateral   . COLONOSCOPY    . CORONARY ARTERY BYPASS GRAFT  2005   LIMA GRAFT TO LAD, SAPHENOUS VEIN GRAFT TO THE FIRST DIAGONAL, AND LEFT RADIAL ARTERY GRAFT TO THE OM  . FOOT SURGERY Right   . JOINT REPLACEMENT    . LITHOTRIPSY    . LUMBAR LAMINECTOMY/DECOMPRESSION MICRODISCECTOMY N/A 09/08/2015   Procedure: CENTRAL DECOMPRESSIVE LUMBAR LAMINECTOMIES L3-4, L4-5 AND BILATERAL HEMILAMINECTOMY L5-S1;  Surgeon: Jessy Oto, MD;  Location: [facility];  Service: Orthopedics;  Laterality: N/A;  . nodule removed from left elbow    . TOTAL KNEE ARTHROPLASTY Bilateral   . TRIPLE BYPASS  04/27/05  . US ECHOCARDIOGRAPHY  01/06/2009   EF 55-60%  . WRIST SURGERY Left    Social History   Social History Narrative  . No narrative on file     Objective: Vital Signs: BP 140/80   Pulse 74   Resp 14   Ht 5' 5"  (1.651 m)   Wt 254 lb (115.2 kg)   BMI 42.27 kg/m    Physical Exam  Constitutional: She is oriented to person, place, and time. She appears well-developed and well-nourished.  HENT:  Head: Normocephalic and atraumatic.  Eyes: Conjunctivae and EOM are normal.  Neck: Normal range of motion.  Cardiovascular: Normal rate, regular rhythm, normal heart sounds and intact distal pulses.   Pulmonary/Chest: Effort normal and breath sounds normal.  Abdominal: Soft. Bowel sounds are normal.  Lymphadenopathy:    She has no cervical adenopathy.  Neurological: She is alert and oriented to person,  place, and time.  Skin: Skin is warm and dry. Capillary refill takes less than 2 seconds.  Psychiatric: She has a normal mood and affect. Her behavior is normal.  Nursing note and vitals reviewed.    Musculoskeletal Exam: C-spine limited range of motion with discomfort, lumbar spine limited range of motion with discomfort. Shoulder joints elbow joints wrist joints are good range of motion. She has mild synovitis over right third MCP joint. She has PIP/DIP joint thickening in her hands consistent with osteoarthritis. Hip joints knee joints ankles MTPs PIPs with good range of motion. She has bilateral total knee replacement which appears to be doing well.  CDAI Exam: CDAI Homunculus Exam:   Tenderness:  Right hand: 3rd MCP  Swelling:  Right hand: 3rd MCP  Joint Counts:  CDAI Tender Joint count: 1 CDAI Swollen Joint count: 1  Global Assessments:  Patient  Global Assessment: 5 Provider Global Assessment: 3  CDAI Calculated Score: 10    Investigation: Findings:  07/02/2008   I obtained x-rays of her hands bilaterally that showed right PIP and DIP narrowing as well as second and third MCP narrowing and CMC narrowing that was severe.  She had these changes on her left as well.  As regards to her labs from 02-2008, her vitamin D was low at 20, sed rate was 8 and CK was 83.    On January 27, 2009, rheumatoid factor was 22.  CCP was negative.    Labs from September 15, 2011 show CMP with GFR normal, CBC with differential normal, sedimentation rate is normal. Acute hepatitis panel, CPK, TSH, angiotensin one converting enzyme are all negative. Rheumatoid factor, ANA and CCP are all negative. Quantitative IgG, IgM and IgA are negative. SPEP with M spike is negative.  02/03/2015 Xrays hands / feet performed as ordered by Dr Estanislado Pandy, report not available for review  April 2017:  CBC and comprehensive metabolic panel was normal.    Labs from 02/17/2016 show CBC with diff normal except for  increase RDW at 16.2.  CMP with GFR is normal except for elevated glucose at 111  08/16/16: CBC normal, CMP normal    Orders Only on 08/16/2016  Component Date Value Ref Range Status  . WBC 08/16/2016 7.6  3.8 - 10.8 K/uL Final  . RBC 08/16/2016 4.27  3.80 - 5.10 MIL/uL Final  . Hemoglobin 08/16/2016 13.0  11.7 - 15.5 g/dL Final  . HCT 08/16/2016 39.6  35.0 - 45.0 % Final  . MCV 08/16/2016 92.7  80.0 - 100.0 fL Final  . MCH 08/16/2016 30.4  27.0 - 33.0 pg Final  . MCHC 08/16/2016 32.8  32.0 - 36.0 g/dL Final  . RDW 08/16/2016 15.3* 11.0 - 15.0 % Final  . Platelets 08/16/2016 201  140 - 400 K/uL Final  . MPV 08/16/2016 10.2  7.5 - 12.5 fL Final  . Neutro Abs 08/16/2016 4560  1,500 - 7,800 cells/uL Final  . Lymphs Abs 08/16/2016 2280  850 - 3,900 cells/uL Final  . Monocytes Absolute 08/16/2016 684  200 - 950 cells/uL Final  . Eosinophils Absolute 08/16/2016 76  15 - 500 cells/uL Final  . Basophils Absolute 08/16/2016 0  0 - 200 cells/uL Final  . Neutrophils Relative % 08/16/2016 60  % Final  . Lymphocytes Relative 08/16/2016 30  % Final  . Monocytes Relative 08/16/2016 9  % Final  . Eosinophils Relative 08/16/2016 1  % Final  . Basophils Relative 08/16/2016 0  % Final  . Smear Review 08/16/2016 Criteria for review not met   Final  . Sodium 08/16/2016 138  135 - 146 mmol/L Final  . Potassium 08/16/2016 4.2  3.5 - 5.3 mmol/L Final  . Chloride 08/16/2016 103  98 - 110 mmol/L Final  . CO2 08/16/2016 26  20 - 31 mmol/L Final  . Glucose, Bld 08/16/2016 156* 65 - 99 mg/dL Final  . BUN 08/16/2016 17  7 - 25 mg/dL Final  . Creat 08/16/2016 0.65  0.60 - 0.93 mg/dL Final   Comment:   For patients > or = 72 years of age: The upper reference limit for Creatinine is approximately 13% higher for people identified as African-American.     . Total Bilirubin 08/16/2016 0.5  0.2 - 1.2 mg/dL Final  . Alkaline Phosphatase 08/16/2016 43  33 - 130 U/L Final  . AST 08/16/2016 23  10 - 35 U/L  Final    . ALT 08/16/2016 20  6 - 29 U/L Final  . Total Protein 08/16/2016 6.5  6.1 - 8.1 g/dL Final  . Albumin 08/16/2016 4.0  3.6 - 5.1 g/dL Final  . Calcium 08/16/2016 8.8  8.6 - 10.4 mg/dL Final  . GFR, Est African American 08/16/2016 >89  >=60 mL/min Final  . GFR, Est Non African American 08/16/2016 >89  >=60 mL/min Final     Imaging: No results found.  Speciality Comments: No specialty comments available.    Procedures:  No procedures performed Allergies: Statins   Assessment / Plan:     Visit Diagnoses: Rheumatoid arthritis of multiple sites with negative rheumatoid factor ([place]): She is fairly well controlled except for mild inflammation in her right third PIP which she states is intermittent.  High risk medication use - Methotrexate 0.8 ML subcutaneous every week, folic acid 2 mg by mouth daily, Plaquenil 200 mg twice a day Monday to Friday -her labs are stable we'll check labs every 3 months. Plan: CBC with Differential/Platelet, COMPLETE METABOLIC PANEL WITH GFR  History of total knee replacement, bilateral: Doing well  DJD (degenerative joint disease), cervical: Chronic pain  Spondylosis of lumbar region without myelopathy or radiculopathy and she's been having increased pain and discomfort in her lower back. I've advised her to discuss with Dr. Louanne Skye. She may need referral to pain clinic in future.  Spinal stenosis, lumbar region, with neurogenic claudication  Her other medical problems are listed as follows:  History of coronary artery disease  History of sleep apnea  History of diabetes mellitus  History of hypertension  History of breast cancer  Morbid obesity ([place])  Vitamin D deficiency    Orders: Orders Placed This Encounter  Procedures  . CBC with Differential/Platelet  . COMPLETE METABOLIC PANEL WITH GFR   No orders of the defined types were placed in this encounter.   Face-to-face time spent with patient was 30 minutes. 50% of time was spent in  counseling and coordination of care.  Follow-Up Instructions: Return in about 5 months (around 02/27/2017) for Rheumatoid arthritis.   Bo Merino, MD  Note - This record has been created using [employer].  Chart creation errors have been sought, but may not always  have been located. Such creation errors do not reflect on  the standard of medical care.

## 2016-09-16 ENCOUNTER — Other Ambulatory Visit: Payer: Self-pay | Admitting: Rheumatology

## 2016-09-16 NOTE — Telephone Encounter (Signed)
Last Visit: 05/10/16 Next Visit: 09/27/16 Labs: 08/16/16 WNL  Okay to refill MTX?

## 2016-09-22 DIAGNOSIS — I8311 Varicose veins of right lower extremity with inflammation: Secondary | ICD-10-CM | POA: Diagnosis not present

## 2016-09-22 DIAGNOSIS — I83891 Varicose veins of right lower extremities with other complications: Secondary | ICD-10-CM | POA: Diagnosis not present

## 2016-09-23 ENCOUNTER — Telehealth (HOSPITAL_COMMUNITY): Payer: Self-pay

## 2016-09-23 NOTE — Telephone Encounter (Signed)
Encounter complete. 

## 2016-09-27 ENCOUNTER — Encounter: Payer: Self-pay | Admitting: Rheumatology

## 2016-09-27 ENCOUNTER — Encounter (HOSPITAL_COMMUNITY): Payer: PPO

## 2016-09-27 ENCOUNTER — Ambulatory Visit (INDEPENDENT_AMBULATORY_CARE_PROVIDER_SITE_OTHER): Payer: PPO | Admitting: Rheumatology

## 2016-09-27 VITALS — BP 140/80 | HR 74 | Resp 14 | Ht 65.0 in | Wt 254.0 lb

## 2016-09-27 DIAGNOSIS — Z853 Personal history of malignant neoplasm of breast: Secondary | ICD-10-CM | POA: Diagnosis not present

## 2016-09-27 DIAGNOSIS — Z8679 Personal history of other diseases of the circulatory system: Secondary | ICD-10-CM

## 2016-09-27 DIAGNOSIS — Z79899 Other long term (current) drug therapy: Secondary | ICD-10-CM

## 2016-09-27 DIAGNOSIS — M0609 Rheumatoid arthritis without rheumatoid factor, multiple sites: Secondary | ICD-10-CM | POA: Diagnosis not present

## 2016-09-27 DIAGNOSIS — M47812 Spondylosis without myelopathy or radiculopathy, cervical region: Secondary | ICD-10-CM

## 2016-09-27 DIAGNOSIS — Z96653 Presence of artificial knee joint, bilateral: Secondary | ICD-10-CM

## 2016-09-27 DIAGNOSIS — M5030 Other cervical disc degeneration, unspecified cervical region: Secondary | ICD-10-CM

## 2016-09-27 DIAGNOSIS — E559 Vitamin D deficiency, unspecified: Secondary | ICD-10-CM | POA: Diagnosis not present

## 2016-09-27 DIAGNOSIS — Z8639 Personal history of other endocrine, nutritional and metabolic disease: Secondary | ICD-10-CM

## 2016-09-27 DIAGNOSIS — M47816 Spondylosis without myelopathy or radiculopathy, lumbar region: Secondary | ICD-10-CM

## 2016-09-27 DIAGNOSIS — M48062 Spinal stenosis, lumbar region with neurogenic claudication: Secondary | ICD-10-CM | POA: Diagnosis not present

## 2016-09-27 NOTE — Patient Instructions (Signed)
Standing Labs We placed an order today for your standing lab work.    Please come back and get your standing labs in April and every 3 months  We have open lab Monday through Friday from 8:30-11:30 AM and 1:30-4 PM at the office of Dr. Tresa Moore, PA.   The office is located at [address] [place], [place], [place] [address] No appointment is necessary.   Labs are drawn by [facility].  You may receive a bill from [facility] for your lab work.

## 2016-09-28 ENCOUNTER — Ambulatory Visit (HOSPITAL_COMMUNITY)
Admission: RE | Admit: 2016-09-28 | Discharge: 2016-09-28 | Disposition: A | Discharge status: Home / Self Care | Payer: PPO | Source: Ambulatory Visit | Attending: Cardiovascular Disease | Admitting: Cardiovascular Disease

## 2016-09-28 ENCOUNTER — Ambulatory Visit (HOSPITAL_COMMUNITY): Payer: PPO

## 2016-09-28 DIAGNOSIS — E7800 Pure hypercholesterolemia, unspecified: Secondary | ICD-10-CM | POA: Diagnosis not present

## 2016-09-28 DIAGNOSIS — I10 Essential (primary) hypertension: Secondary | ICD-10-CM | POA: Insufficient documentation

## 2016-09-28 DIAGNOSIS — I2510 Atherosclerotic heart disease of native coronary artery without angina pectoris: Secondary | ICD-10-CM | POA: Diagnosis not present

## 2016-09-28 DIAGNOSIS — R0609 Other forms of dyspnea: Secondary | ICD-10-CM

## 2016-09-28 MED ORDER — REGADENOSON 0.4 MG/5ML IV SOLN
0.4000 mg | Freq: Once | INTRAVENOUS | Status: AC
  Administered 2016-09-28: 0.4 mg via INTRAVENOUS

## 2016-09-28 MED ORDER — AMINOPHYLLINE 25 MG/ML IV SOLN
100.0000 mg | Freq: Once | INTRAVENOUS | Status: AC
  Administered 2016-09-28: 100 mg via INTRAVENOUS

## 2016-09-28 MED ORDER — TECHNETIUM TC 99M TETROFOSMIN IV KIT
31.0000 | PACK | Freq: Once | INTRAVENOUS | Status: AC | PRN
  Administered 2016-09-28: 31 via INTRAVENOUS
  Filled 2016-09-28: qty 31

## 2016-09-29 ENCOUNTER — Ambulatory Visit (HOSPITAL_COMMUNITY)
Admission: RE | Admit: 2016-09-29 | Discharge: 2016-09-29 | Disposition: A | Discharge status: Home / Self Care | Payer: PPO | Source: Ambulatory Visit | Attending: Internal Medicine | Admitting: Internal Medicine

## 2016-09-29 LAB — MYOCARDIAL PERFUSION IMAGING
CHL CUP NUCLEAR SRS: 2
CHL CUP NUCLEAR SSS: 7
CSEPPHR: 85 {beats}/min
LV sys vol: 59 mL
LVDIAVOL: 122 mL (ref 46–106)
Rest HR: 67 {beats}/min
SDS: 5
TID: 0.89

## 2016-09-29 MED ORDER — TECHNETIUM TC 99M TETROFOSMIN IV KIT
28.0000 | PACK | Freq: Once | INTRAVENOUS | Status: AC | PRN
  Administered 2016-09-29: 28 via INTRAVENOUS

## 2016-11-01 DIAGNOSIS — I8311 Varicose veins of right lower extremity with inflammation: Secondary | ICD-10-CM | POA: Diagnosis not present

## 2016-11-01 DIAGNOSIS — I83891 Varicose veins of right lower extremities with other complications: Secondary | ICD-10-CM | POA: Diagnosis not present

## 2016-11-21 ENCOUNTER — Encounter (INDEPENDENT_AMBULATORY_CARE_PROVIDER_SITE_OTHER): Payer: Self-pay | Admitting: Orthopaedic Surgery

## 2016-11-21 ENCOUNTER — Ambulatory Visit (INDEPENDENT_AMBULATORY_CARE_PROVIDER_SITE_OTHER): Payer: PPO

## 2016-11-21 ENCOUNTER — Ambulatory Visit (INDEPENDENT_AMBULATORY_CARE_PROVIDER_SITE_OTHER): Payer: PPO | Admitting: Orthopaedic Surgery

## 2016-11-21 VITALS — BP 127/78 | HR 74 | Ht 65.0 in | Wt 259.0 lb

## 2016-11-21 DIAGNOSIS — M25511 Pain in right shoulder: Secondary | ICD-10-CM

## 2016-11-21 MED ORDER — BUPIVACAINE HCL 0.5 % IJ SOLN
2.0000 mL | INTRAMUSCULAR | Status: AC | PRN
  Administered 2016-11-21: 2 mL via INTRA_ARTICULAR

## 2016-11-21 MED ORDER — LIDOCAINE HCL 1 % IJ SOLN
2.0000 mL | INTRAMUSCULAR | Status: AC | PRN
  Administered 2016-11-21: 2 mL

## 2016-11-21 MED ORDER — METHYLPREDNISOLONE ACETATE 40 MG/ML IJ SUSP
80.0000 mg | INTRAMUSCULAR | Status: AC | PRN
  Administered 2016-11-21: 80 mg

## 2016-11-21 NOTE — Progress Notes (Signed)
Office Visit Note   Patient: [name]           Date of Birth: [birth year]           MRN: [id number] Visit Date: 11/21/2016              Requested by: Crist Infante, MD [address] [place], [place] [address] PCP: Jerlyn Ly, MD   Assessment & Plan: Visit Diagnoses:  1. Acute pain of right shoulder   Mild osteoarthritis right shoulder with possible rotator cuff tear  Plan: Cortisone injection subacromial space right shoulder. Monitor over the next several weeks. If she continues to have pain I would suggest an MRI scan which we can accomplish over the phone  Follow-Up Instructions: No Follow-up on file.   Orders:  Orders Placed This Encounter  Procedures  . XR Shoulder Right   No orders of the defined types were placed in this encounter.     Procedures: Large Joint Inj Date/Time: 11/21/2016 4:59 PM Performed by: Garald Balding Authorized by: Garald Balding   Consent Given by:  Patient Timeout: prior to procedure the correct patient, procedure, and site was verified   Indications:  Pain Location:  Shoulder Site:  L subacromial bursa Prep: patient was prepped and draped in usual sterile fashion   Needle Size:  25 G Needle Length:  1.5 inches Approach:  Lateral Ultrasound Guidance: No   Fluoroscopic Guidance: No   Arthrogram: No   Medications:  80 mg methylPREDNISolone acetate 40 MG/ML; 2 mL lidocaine 1 %; 2 mL bupivacaine 0.5 % Aspiration Attempted: No   Patient tolerance:  Patient tolerated the procedure well with no immediate complications     Clinical Data: No additional findings.   Subjective: Chief Complaint  Patient presents with  . Right Shoulder - Pain    [name] is a 71 y o that presents with Right shoulder pain when she picked up a bag of trash on Saturday. She "felt a pull".She relates she sees Mr. Carlyon Shadow for RA and hx of spine surgery in 08/2015. Hx spine surgery 2017 Diabetes controlled A1c--6  Acute onset of right shoulder  pain as discussed above. She's not noticed any ecchymosis or erythema. She has been able to raise her arm over her head but with some discomfort. No sensory changes distally. She did have an MRI scan of her shoulder several years ago that demonstrated some irregularity of the infra and supraspinatus tendons but without an obvious full-thickness tear. She had moderate tendinopathy involving the articular portion of the biceps tendon and mild to moderate acromioclavicular joint changes  HPI  Review of Systems   Objective: Vital Signs: BP 127/78   Pulse 74   Ht 5\' 5"  (1.651 m)   Wt 259 lb (117.5 kg)   BMI 43.10 kg/m   Physical Exam  Ortho Exam awake alert and oriented 3. Comfortable sitting. Cortically discomfort with active overhead motion of her right shoulder she could place her arm overhead but with a circuitous motion. Positive impingement and positive empty can testing. Large arms. Unable to determine of biceps is intact. No skin changes. No pain at the acromioclavicular joint.  Specialty Comments:  No specialty comments available.  Imaging: Xr Shoulder Right  Result Date: 11/21/2016 X-ray of the right shoulder was obtained in 2 projections. There is a very small inferior humeral head spur consistent with osteoarthritis. Joint space appears to be maintained. Normal space between the humeral head and acromion. Prominence of the anterior  acromium and some degenerative change at the acromioclavicular joint. No ectopic calcification.    PMFS History: Patient Active Problem List   Diagnosis Date Noted  . Vitamin D deficiency 09/14/2016  . History of total knee replacement, bilateral 09/14/2016  . DJD (degenerative joint disease), cervical 09/14/2016  . 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 High risk medication use 05/20/2016  . Spinal stenosis, lumbar region, with neurogenic claudication 09/08/2015    Class: Chronic  . OSA (obstructive  sleep apnea) 10/17/2013  . Dyspnea 09/04/2013  . Atypical chest pain 09/04/2013  . Morbid obesity ([place]) 03/21/2012  . Cancer of lower-outer quadrant of female breast ([place]) 03/15/2012  . Diabetes mellitus type II 02/22/2011  . Coronary artery disease   . Hypertension   . Hyperlipidemia   . OA (osteoarthritis) of knee    Past Medical History:  Diagnosis Date  . Asthma    Albuterol inhaler as needed.Pulmicort neb as needed  . Asthma   . Breast cancer ([place])   . Chronic back pain    spinal stenosis  . Coronary artery disease   . Depression    takes CYmbalta and Wellbutrin daily  . Diabetes mellitus    not on any meds/controlled by diet  . Dyspnea    with exertion occasionally when lies down  . GERD (gastroesophageal reflux disease)    takes Omeprazole daily  . Headache    oocasionally  . History of kidney stones   . Hyperlipidemia    not on any meds  . Hypertension    takes Benazepril,Bystolic,and Amlodipine daily  . IBS (irritable bowel syndrome)   . Joint pain   . Joint swelling   . Nocturia   . OA (osteoarthritis) of knee   . OSA (obstructive sleep apnea)   . Peripheral edema    takes daily as needed  . Peripheral neuropathy   . Pneumonia    hx of-several yrs ago  . RA (rheumatoid arthritis) ([place])   . Urinary frequency   . Urinary urgency   . Weakness    numbness and tingling mainly in left leg occasionally in right.Tingling/numbness in hands    Family History  Problem Relation Age of Onset  . Heart disease Mother   . Cancer Father   . Cancer Brother   . Heart disease Brother     Past Surgical History:  Procedure Laterality Date  . BREAST LUMPECTOMY     RIGHT BREAST  . CARDIAC CATHETERIZATION  04/23/2004   EF 60%  . cataract surgery Bilateral   . COLONOSCOPY    . CORONARY ARTERY BYPASS GRAFT  2005   LIMA GRAFT TO LAD, SAPHENOUS VEIN GRAFT TO THE FIRST DIAGONAL, AND LEFT RADIAL ARTERY GRAFT TO THE OM  . FOOT SURGERY Right   . JOINT REPLACEMENT    .  LITHOTRIPSY    . LUMBAR LAMINECTOMY/DECOMPRESSION MICRODISCECTOMY N/A 09/08/2015   Procedure: CENTRAL DECOMPRESSIVE LUMBAR LAMINECTOMIES L3-4, L4-5 AND BILATERAL HEMILAMINECTOMY L5-S1;  Surgeon: Jessy Oto, MD;  Location: [facility];  Service: Orthopedics;  Laterality: N/A;  . nodule removed from left elbow    . TOTAL KNEE ARTHROPLASTY Bilateral   . TRIPLE BYPASS  04/27/05  . US ECHOCARDIOGRAPHY  01/06/2009   EF 55-60%  . WRIST SURGERY Left    Social History   Occupational History  . retired Unemployed   Social History Main Topics  . Smoking status: Never Smoker  . Smokeless tobacco: Never Used  . Alcohol use No  .  Drug use: No  . Sexual activity: Not on file     Garald Balding, MD   Note - This record has been created using [employer].  Chart creation errors have been sought, but may not always  have been located. Such creation errors do not reflect on  the standard of medical care.

## 2016-12-02 DIAGNOSIS — I872 Venous insufficiency (chronic) (peripheral): Secondary | ICD-10-CM | POA: Diagnosis not present

## 2016-12-05 DIAGNOSIS — Z6841 Body Mass Index (BMI) 40.0 and over, adult: Secondary | ICD-10-CM | POA: Diagnosis not present

## 2016-12-05 DIAGNOSIS — L0390 Cellulitis, unspecified: Secondary | ICD-10-CM | POA: Diagnosis not present

## 2016-12-07 DIAGNOSIS — R2242 Localized swelling, mass and lump, left lower limb: Secondary | ICD-10-CM | POA: Diagnosis not present

## 2016-12-07 DIAGNOSIS — L0390 Cellulitis, unspecified: Secondary | ICD-10-CM | POA: Diagnosis not present

## 2016-12-07 DIAGNOSIS — Z6841 Body Mass Index (BMI) 40.0 and over, adult: Secondary | ICD-10-CM | POA: Diagnosis not present

## 2016-12-12 DIAGNOSIS — L0390 Cellulitis, unspecified: Secondary | ICD-10-CM | POA: Diagnosis not present

## 2016-12-12 DIAGNOSIS — R2242 Localized swelling, mass and lump, left lower limb: Secondary | ICD-10-CM | POA: Diagnosis not present

## 2016-12-12 DIAGNOSIS — Z6841 Body Mass Index (BMI) 40.0 and over, adult: Secondary | ICD-10-CM | POA: Diagnosis not present

## 2016-12-15 ENCOUNTER — Other Ambulatory Visit: Payer: Self-pay

## 2016-12-15 ENCOUNTER — Other Ambulatory Visit (INDEPENDENT_AMBULATORY_CARE_PROVIDER_SITE_OTHER): Payer: Self-pay

## 2016-12-15 ENCOUNTER — Other Ambulatory Visit (INDEPENDENT_AMBULATORY_CARE_PROVIDER_SITE_OTHER): Payer: Self-pay | Admitting: Orthopaedic Surgery

## 2016-12-15 DIAGNOSIS — M25511 Pain in right shoulder: Principal | ICD-10-CM

## 2016-12-15 DIAGNOSIS — Z79899 Other long term (current) drug therapy: Secondary | ICD-10-CM

## 2016-12-15 DIAGNOSIS — G8929 Other chronic pain: Secondary | ICD-10-CM

## 2016-12-15 LAB — CBC WITH DIFFERENTIAL/PLATELET
BASOS ABS: 0 {cells}/uL (ref 0–200)
Basophils Relative: 0 %
EOS PCT: 1 %
Eosinophils Absolute: 65 cells/uL (ref 15–500)
HCT: 39.4 % (ref 35.0–45.0)
Hemoglobin: 13.1 g/dL (ref 11.7–15.5)
LYMPHS ABS: 2340 {cells}/uL (ref 850–3900)
Lymphocytes Relative: 36 %
MCH: 30.3 pg (ref 27.0–33.0)
MCHC: 33.2 g/dL (ref 32.0–36.0)
MCV: 91.2 fL (ref 80.0–100.0)
MONOS PCT: 7 %
MPV: 10.4 fL (ref 7.5–12.5)
Monocytes Absolute: 455 cells/uL (ref 200–950)
NEUTROS ABS: 3640 {cells}/uL (ref 1500–7800)
Neutrophils Relative %: 56 %
PLATELETS: 197 10*3/uL (ref 140–400)
RBC: 4.32 MIL/uL (ref 3.80–5.10)
RDW: 14.9 % (ref 11.0–15.0)
WBC: 6.5 10*3/uL (ref 3.8–10.8)

## 2016-12-15 LAB — COMPLETE METABOLIC PANEL WITH GFR
ALK PHOS: 52 U/L (ref 33–130)
ALT: 17 U/L (ref 6–29)
AST: 18 U/L (ref 10–35)
Albumin: 4.1 g/dL (ref 3.6–5.1)
BUN: 13 mg/dL (ref 7–25)
CO2: 27 mmol/L (ref 20–31)
Calcium: 9.2 mg/dL (ref 8.6–10.4)
Chloride: 103 mmol/L (ref 98–110)
Creat: 0.7 mg/dL (ref 0.60–0.93)
GFR, EST NON AFRICAN AMERICAN: 87 mL/min (ref >=60)
GFR, Est African American: >89 mL/min (ref >=60)
GLUCOSE: 195 mg/dL — AB (ref 65–99)
POTASSIUM: 4 mmol/L (ref 3.5–5.3)
SODIUM: 141 mmol/L (ref 135–146)
Total Bilirubin: 0.5 mg/dL (ref 0.2–1.2)
Total Protein: 6.3 g/dL (ref 6.1–8.1)

## 2016-12-15 MED ORDER — METHOTREXATE SODIUM CHEMO INJECTION 50 MG/2ML: INTRAMUSCULAR | 0 refills | Status: DC

## 2016-12-15 NOTE — Telephone Encounter (Signed)
ok 

## 2016-12-15 NOTE — Telephone Encounter (Signed)
Sent referral for MRI per note in chart

## 2016-12-15 NOTE — Telephone Encounter (Signed)
Patient ready to schedule MRI of Rt Shoulder. Please let her know if this is okay.  Patient also needs rf on MTX. Patient uses [facility]. Please call when rf is called in.

## 2016-12-15 NOTE — Telephone Encounter (Signed)
Last Visit: 09/27/16 Next Visit: 02/28/17 Labs: 08/16/16 WNL Patient update labs today   Okay to refill MTX?

## 2016-12-16 DIAGNOSIS — Z4800 Encounter for change or removal of nonsurgical wound dressing: Secondary | ICD-10-CM | POA: Diagnosis not present

## 2016-12-16 DIAGNOSIS — R2242 Localized swelling, mass and lump, left lower limb: Secondary | ICD-10-CM | POA: Diagnosis not present

## 2016-12-16 DIAGNOSIS — Z6841 Body Mass Index (BMI) 40.0 and over, adult: Secondary | ICD-10-CM | POA: Diagnosis not present

## 2016-12-16 NOTE — Progress Notes (Signed)
Blood glucose elevated please notify patient

## 2016-12-23 ENCOUNTER — Ambulatory Visit (INDEPENDENT_AMBULATORY_CARE_PROVIDER_SITE_OTHER): Payer: PPO | Admitting: Orthopaedic Surgery

## 2016-12-26 ENCOUNTER — Ambulatory Visit (INDEPENDENT_AMBULATORY_CARE_PROVIDER_SITE_OTHER): Payer: PPO | Admitting: Orthopaedic Surgery

## 2016-12-26 ENCOUNTER — Encounter (INDEPENDENT_AMBULATORY_CARE_PROVIDER_SITE_OTHER): Payer: Self-pay | Admitting: Orthopaedic Surgery

## 2016-12-26 DIAGNOSIS — S81802A Unspecified open wound, left lower leg, initial encounter: Secondary | ICD-10-CM | POA: Insufficient documentation

## 2016-12-26 MED ORDER — DOXYCYCLINE HYCLATE 50 MG PO CAPS: 100.0000 mg | ORAL_CAPSULE | Freq: Two times a day (BID) | ORAL | 0 refills | Status: DC

## 2016-12-26 MED ORDER — COLLAGENASE 250 UNIT/GM EX OINT: 1.0000 "application " | TOPICAL_OINTMENT | Freq: Every day | CUTANEOUS | 0 refills | Status: DC

## 2016-12-26 NOTE — Progress Notes (Signed)
Office Visit Note   Patient: [name]           Date of Birth: Aug 30, 1945           MRN: [id number] Visit Date: 12/26/2016              Requested by: Crist Infante, MD [address] [place], [place] [address] PCP: Crist Infante, MD   Assessment & Plan: Visit Diagnoses:  1. Open wound of left lower leg, initial encounter     Plan: I recommend a referral to Dr. Sharol Given for further wound care and evaluation. Ointment for Santyl and doxycycline was prescribed today. Follow-up with me as needed.  Follow-Up Instructions: Return if symptoms worsen or fail to improve.   Orders:  No orders of the defined types were placed in this encounter.  Meds ordered this encounter  Medications  . collagenase (SANTYL) ointment    Sig: Apply 1 application topically daily.    Dispense:  15 g    Refill:  0  . doxycycline (VIBRAMYCIN) 50 MG capsule    Sig: Take 2 capsules (100 mg total) by mouth 2 (two) times daily.    Dispense:  14 capsule    Refill:  0      Procedures: No procedures performed   Clinical Data: No additional findings.   Subjective: Chief Complaint  Patient presents with  . Left Leg - Pain, Open Wound    Patient comes in today with left posterior calf wound for about 4 weeks. She has rheumatoid arthritis and she is on methotrexate. She is not on any antibiotics. She states that she's had some yellowish drainage from this area. She's had a not before in this area and it opened up recently. She denies any constitutional symptoms.    Review of Systems  Constitutional: Negative.   HENT: Negative.   Eyes: Negative.   Respiratory: Negative.   Cardiovascular: Negative.   Endocrine: Negative.   Musculoskeletal: Negative.   Neurological: Negative.   Hematological: Negative.   Psychiatric/Behavioral: Negative.   All other systems reviewed and are negative.    Objective: Vital Signs: There were no vitals taken for this visit.  Physical Exam  Constitutional: She  is oriented to person, place, and time. She appears well-developed and well-nourished.  HENT:  Head: Normocephalic and atraumatic.  Eyes: EOM are normal.  Neck: Neck supple.  Pulmonary/Chest: Effort normal.  Abdominal: Soft.  Neurological: She is alert and oriented to person, place, and time.  Skin: Skin is warm. Capillary refill takes less than 2 seconds.  Psychiatric: She has a normal mood and affect. Her behavior is normal. Judgment and thought content normal.  Nursing note and vitals reviewed.   Ortho Exam Left lower leg wound shows a pea-sized wound with surrounding erythema. She has fibinous exudative tissue. There is no significant drainage. Specialty Comments:  No specialty comments available.  Imaging: No results found.   PMFS History: Patient Active Problem List   Diagnosis Date Noted  . Open wound of left lower leg 12/26/2016  . Vitamin D deficiency 09/14/2016  . History of total knee replacement, bilateral 09/14/2016  . DJD (degenerative joint disease), cervical 09/14/2016  . 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 High risk medication use 05/20/2016  . Spinal stenosis, lumbar region, with neurogenic claudication 09/08/2015    Class: Chronic  . OSA (obstructive sleep apnea) 10/17/2013  . Dyspnea 09/04/2013  . Atypical chest pain 09/04/2013  . Morbid obesity ([facility]) 03/21/2012  .  Cancer of lower-outer quadrant of female breast ([facility]) 03/15/2012  . Diabetes mellitus type II 02/22/2011  . Coronary artery disease   . Hypertension   . Hyperlipidemia   . OA (osteoarthritis) of knee    Past Medical History:  Diagnosis Date  . Asthma    Albuterol inhaler as needed.Pulmicort neb as needed  . Asthma   . Breast cancer ([facility])   . Chronic back pain    spinal stenosis  . Coronary artery disease   . Depression    takes CYmbalta and Wellbutrin daily  . Diabetes mellitus    not on any meds/controlled by diet  . Dyspnea    with  exertion occasionally when lies down  . GERD (gastroesophageal reflux disease)    takes Omeprazole daily  . Headache    oocasionally  . History of kidney stones   . Hyperlipidemia    not on any meds  . Hypertension    takes Benazepril,Bystolic,and Amlodipine daily  . IBS (irritable bowel syndrome)   . Joint pain   . Joint swelling   . Nocturia   . OA (osteoarthritis) of knee   . OSA (obstructive sleep apnea)   . Peripheral edema    takes daily as needed  . Peripheral neuropathy   . Pneumonia    hx of-several yrs ago  . RA (rheumatoid arthritis) ([facility])   . Urinary frequency   . Urinary urgency   . Weakness    numbness and tingling mainly in left leg occasionally in right.Tingling/numbness in hands    Family History  Problem Relation Age of Onset  . Heart disease Mother   . Cancer Father   . Cancer Brother   . Heart disease Brother     Past Surgical History:  Procedure Laterality Date  . BREAST LUMPECTOMY     RIGHT BREAST  . CARDIAC CATHETERIZATION  04/23/2004   EF 60%  . cataract surgery Bilateral   . COLONOSCOPY    . CORONARY ARTERY BYPASS GRAFT  2005   LIMA GRAFT TO LAD, SAPHENOUS VEIN GRAFT TO THE FIRST DIAGONAL, AND LEFT RADIAL ARTERY GRAFT TO THE OM  . FOOT SURGERY Right   . JOINT REPLACEMENT    . LITHOTRIPSY    . LUMBAR LAMINECTOMY/DECOMPRESSION MICRODISCECTOMY N/A 09/08/2015   Procedure: CENTRAL DECOMPRESSIVE LUMBAR LAMINECTOMIES L3-4, L4-5 AND BILATERAL HEMILAMINECTOMY L5-S1;  Surgeon: Jessy Oto, MD;  Location: [facility];  Service: Orthopedics;  Laterality: N/A;  . nodule removed from left elbow    . TOTAL KNEE ARTHROPLASTY Bilateral   . TRIPLE BYPASS  04/27/05  . US ECHOCARDIOGRAPHY  01/06/2009   EF 55-60%  . WRIST SURGERY Left    Social History   Occupational History  . retired Unemployed   Social History Main Topics  . Smoking status: Never Smoker  . Smokeless tobacco: Never Used  . Alcohol use No  . Drug use: No  . Sexual activity: Not on file

## 2016-12-28 ENCOUNTER — Encounter (INDEPENDENT_AMBULATORY_CARE_PROVIDER_SITE_OTHER): Payer: Self-pay | Admitting: Orthopedic Surgery

## 2016-12-28 ENCOUNTER — Ambulatory Visit (INDEPENDENT_AMBULATORY_CARE_PROVIDER_SITE_OTHER): Payer: PPO | Admitting: Orthopedic Surgery

## 2016-12-28 ENCOUNTER — Other Ambulatory Visit: Payer: Self-pay | Admitting: *Deleted

## 2016-12-28 VITALS — Ht 65.0 in | Wt 259.0 lb

## 2016-12-28 DIAGNOSIS — L97919 Non-pressure chronic ulcer of unspecified part of right lower leg with unspecified severity: Secondary | ICD-10-CM

## 2016-12-28 DIAGNOSIS — C50519 Malignant neoplasm of lower-outer quadrant of unspecified female breast: Secondary | ICD-10-CM

## 2016-12-28 DIAGNOSIS — I87333 Chronic venous hypertension (idiopathic) with ulcer and inflammation of bilateral lower extremity: Secondary | ICD-10-CM | POA: Diagnosis not present

## 2016-12-28 DIAGNOSIS — L97929 Non-pressure chronic ulcer of unspecified part of left lower leg with unspecified severity: Principal | ICD-10-CM

## 2016-12-28 NOTE — Progress Notes (Signed)
Office Visit Note   Patient: [name]           Date of Birth: [birth year]           MRN: [id number] Visit Date: 12/28/2016              Requested by: Crist Infante, MD [address] [place], [place] [address] PCP: Crist Infante, MD  Chief Complaint  Patient presents with  . Left Leg - Wound Check    Posterior calf ulcer      HPI: Patient is a 71 year old woman with multiple medical problems who presents with chronic venous insufficiency bilateral lower extremities. Patient has undergone laser therapy for her venous stasis changes in both lower extremities. She has a chronic ulcer in the posterior aspect of the left calf she complains of pain in both legs throbbing at night states she's had some clear drainage from the ulcer on the left calf she states the drainage started about a month ago. She has been on doxycycline as well as wound care ointment. Patient has had a history of cancer she has had tamoxifen and currently on methotrexate.  Assessment & Plan: Visit Diagnoses:  1. Idiopathic chronic venous hypertension of both lower extremities with ulcer and inflammation (HCC)     Plan: Discussed with the patient that we could do a multilayer compression wrap or a multilayer compression stocking. Patient states she would like to proceed with the compression stocking. She is given a prescription to go to go for medical supply she will wear the stocking and sleeve during the day the socket night change this daily she states she only wants to wear on the left side at this time for the ulcer that's over a year old.  Follow-Up Instructions: Return in about 2 weeks (around 01/11/2017).   Ortho Exam  Patient is alert, oriented, no adenopathy, well-dressed, normal affect, normal respiratory effort. Examination she has an antalgic gait she has brawny skin color changes in both legs with massive pitting edema in both legs with tenderness to palpation there is no redness no cellulitis no signs  of infection. She does have a chronic ulcer on the posterior medial aspect left This is about 5 mm in diameter 1 mm deep there is fibrinous exudative tissue on the surface after debridement there is good healthy granulation tissue there is no tunneling.  Imaging: No results found.  Labs: Lab Results  Component Value Date   HGBA1C 6.2 (H) 09/03/2015   HGBA1C 6.5 (H) 04/15/2011   HGBA1C (H) 08/06/2010    6.5 (NOTE)                                                                       According to the ADA Clinical Practice Recommendations for 2011, when HbA1c is used as a screening test:   >=6.5%   Diagnostic of Diabetes Mellitus           (if abnormal result  is confirmed)  5.7-6.4%   Increased risk of developing Diabetes Mellitus  References:Diagnosis and Classification of Diabetes Mellitus,Diabetes SWFU,9323,55(DDUKG 1):S62-S69 and Standards of Medical Care in         Diabetes - 2011,Diabetes Care,2011,34  (Suppl 1):S11-S61.   REPTSTATUS 07/29/2010 FINAL 07/28/2010  CULT NO GROWTH 07/28/2010    Orders:  No orders of the defined types were placed in this encounter.  No orders of the defined types were placed in this encounter.    Procedures: No procedures performed  Clinical Data: No additional findings.  ROS:  All other systems negative, except as noted in the HPI. Review of Systems  Objective: Vital Signs: Ht 5\' 5"  (1.651 m)   Wt 259 lb (117.5 kg)   BMI 43.10 kg/m   Specialty Comments:  No specialty comments available.  PMFS History: Patient Active Problem List   Diagnosis Date Noted  . Open wound of left lower leg 12/26/2016  . Vitamin D deficiency 09/14/2016  . History of total knee replacement, bilateral 09/14/2016  . DJD (degenerative joint disease), cervical 09/14/2016  . 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 High risk medication use 05/20/2016  . Spinal stenosis, lumbar region, with neurogenic claudication  09/08/2015    Class: Chronic  . OSA (obstructive sleep apnea) 10/17/2013  . Dyspnea 09/04/2013  . Atypical chest pain 09/04/2013  . Morbid obesity ([facility]) 03/21/2012  . Cancer of lower-outer quadrant of female breast ([facility]) 03/15/2012  . Diabetes mellitus type II 02/22/2011  . Coronary artery disease   . Hypertension   . Hyperlipidemia   . OA (osteoarthritis) of knee    Past Medical History:  Diagnosis Date  . Asthma    Albuterol inhaler as needed.Pulmicort neb as needed  . Asthma   . Breast cancer ([facility])   . Chronic back pain    spinal stenosis  . Coronary artery disease   . Depression    takes CYmbalta and Wellbutrin daily  . Diabetes mellitus    not on any meds/controlled by diet  . Dyspnea    with exertion occasionally when lies down  . GERD (gastroesophageal reflux disease)    takes Omeprazole daily  . Headache    oocasionally  . History of kidney stones   . Hyperlipidemia    not on any meds  . Hypertension    takes Benazepril,Bystolic,and Amlodipine daily  . IBS (irritable bowel syndrome)   . Joint pain   . Joint swelling   . Nocturia   . OA (osteoarthritis) of knee   . OSA (obstructive sleep apnea)   . Peripheral edema    takes daily as needed  . Peripheral neuropathy   . Pneumonia    hx of-several yrs ago  . RA (rheumatoid arthritis) ([facility])   . Urinary frequency   . Urinary urgency   . Weakness    numbness and tingling mainly in left leg occasionally in right.Tingling/numbness in hands    Family History  Problem Relation Age of Onset  . Heart disease Mother   . Cancer Father   . Cancer Brother   . Heart disease Brother     Past Surgical History:  Procedure Laterality Date  . BREAST LUMPECTOMY     RIGHT BREAST  . CARDIAC CATHETERIZATION  04/23/2004   EF 60%  . cataract surgery Bilateral   . COLONOSCOPY    . CORONARY ARTERY BYPASS GRAFT  2005   LIMA GRAFT TO LAD, SAPHENOUS VEIN GRAFT TO THE FIRST DIAGONAL, AND LEFT RADIAL ARTERY GRAFT TO THE OM    . FOOT SURGERY Right   . JOINT REPLACEMENT    . LITHOTRIPSY    . LUMBAR LAMINECTOMY/DECOMPRESSION MICRODISCECTOMY N/A 09/08/2015   Procedure: CENTRAL DECOMPRESSIVE LUMBAR LAMINECTOMIES L3-4, L4-5 AND BILATERAL HEMILAMINECTOMY L5-S1;  Surgeon:  Jessy Oto, MD;  Location: [facility];  Service: Orthopedics;  Laterality: N/A;  . nodule removed from left elbow    . TOTAL KNEE ARTHROPLASTY Bilateral   . TRIPLE BYPASS  04/27/05  . US ECHOCARDIOGRAPHY  01/06/2009   EF 55-60%  . WRIST SURGERY Left    Social History   Occupational History  . retired Unemployed   Social History Main Topics  . Smoking status: Never Smoker  . Smokeless tobacco: Never Used  . Alcohol use No  . Drug use: No  . Sexual activity: Not on file

## 2016-12-29 ENCOUNTER — Ambulatory Visit (HOSPITAL_BASED_OUTPATIENT_CLINIC_OR_DEPARTMENT_OTHER): Payer: PPO | Admitting: Hematology & Oncology

## 2016-12-29 ENCOUNTER — Other Ambulatory Visit (HOSPITAL_BASED_OUTPATIENT_CLINIC_OR_DEPARTMENT_OTHER): Payer: PPO

## 2016-12-29 ENCOUNTER — Telehealth (INDEPENDENT_AMBULATORY_CARE_PROVIDER_SITE_OTHER): Payer: Self-pay

## 2016-12-29 ENCOUNTER — Other Ambulatory Visit (INDEPENDENT_AMBULATORY_CARE_PROVIDER_SITE_OTHER): Payer: Self-pay

## 2016-12-29 ENCOUNTER — Telehealth (INDEPENDENT_AMBULATORY_CARE_PROVIDER_SITE_OTHER): Payer: Self-pay | Admitting: Orthopaedic Surgery

## 2016-12-29 VITALS — BP 180/93 | HR 73 | Temp 98.5°F | Resp 18 | Wt 254.0 lb

## 2016-12-29 DIAGNOSIS — M4807 Spinal stenosis, lumbosacral region: Secondary | ICD-10-CM | POA: Diagnosis not present

## 2016-12-29 DIAGNOSIS — N6452 Nipple discharge: Secondary | ICD-10-CM

## 2016-12-29 DIAGNOSIS — G8929 Other chronic pain: Secondary | ICD-10-CM

## 2016-12-29 DIAGNOSIS — Z853 Personal history of malignant neoplasm of breast: Secondary | ICD-10-CM | POA: Diagnosis not present

## 2016-12-29 DIAGNOSIS — C50519 Malignant neoplasm of lower-outer quadrant of unspecified female breast: Secondary | ICD-10-CM

## 2016-12-29 DIAGNOSIS — M25511 Pain in right shoulder: Principal | ICD-10-CM

## 2016-12-29 DIAGNOSIS — C50511 Malignant neoplasm of lower-outer quadrant of right female breast: Secondary | ICD-10-CM

## 2016-12-29 DIAGNOSIS — Z171 Estrogen receptor negative status [ER-]: Principal | ICD-10-CM

## 2016-12-29 LAB — COMPREHENSIVE METABOLIC PANEL
ALT: 23 U/L (ref 0–55)
AST: 23 U/L (ref 5–34)
Albumin: 4.1 g/dL (ref 3.5–5.0)
Alkaline Phosphatase: 61 U/L (ref 40–150)
Anion Gap: 10 mEq/L (ref 3–11)
BILIRUBIN TOTAL: 0.52 mg/dL (ref 0.20–1.20)
BUN: 16.3 mg/dL (ref 7.0–26.0)
CALCIUM: 9.9 mg/dL (ref 8.4–10.4)
CO2: 29 mEq/L (ref 22–29)
Chloride: 103 mEq/L (ref 98–109)
Creatinine: 0.7 mg/dL (ref 0.6–1.1)
EGFR: 84 mL/min/{1.73_m2} — AB (ref >90)
GLUCOSE: 146 mg/dL — AB (ref 70–140)
POTASSIUM: 3.7 meq/L (ref 3.5–5.1)
Sodium: 142 mEq/L (ref 136–145)
TOTAL PROTEIN: 7.2 g/dL (ref 6.4–8.3)

## 2016-12-29 LAB — CBC WITH DIFFERENTIAL (CANCER CENTER ONLY)
BASO#: 0 10*3/uL (ref 0.0–0.2)
BASO%: 0.3 % (ref 0.0–2.0)
EOS ABS: 0.1 10*3/uL (ref 0.0–0.5)
EOS%: 0.9 % (ref 0.0–7.0)
HEMATOCRIT: 41.1 % (ref 34.8–46.6)
HEMOGLOBIN: 13.7 g/dL (ref 11.6–15.9)
LYMPH#: 2.3 10*3/uL (ref 0.9–3.3)
LYMPH%: 33.8 % (ref 14.0–48.0)
MCH: 31.3 pg (ref 26.0–34.0)
MCHC: 33.3 g/dL (ref 32.0–36.0)
MCV: 94 fL (ref 81–101)
MONO#: 0.8 10*3/uL (ref 0.1–0.9)
MONO%: 12.5 % (ref 0.0–13.0)
NEUT%: 52.5 % (ref 39.6–80.0)
NEUTROS ABS: 3.5 10*3/uL (ref 1.5–6.5)
Platelets: 176 10*3/uL (ref 145–400)
RBC: 4.38 10*6/uL (ref 3.70–5.32)
RDW: 14 % (ref 11.1–15.7)
WBC: 6.7 10*3/uL (ref 3.9–10.0)

## 2016-12-29 MED ORDER — COLLAGENASE 250 UNIT/GM EX OINT: 1.0000 "application " | TOPICAL_OINTMENT | Freq: Every day | CUTANEOUS | 0 refills | Status: DC

## 2016-12-29 NOTE — Progress Notes (Signed)
Hematology and Oncology Follow Up Visit  [name] [id number] 10-Jul-1946 71 y.o. 12/29/2016   Principle Diagnosis:  Stage  I (T1N0M0) ductal carcinoma the right breast   Current Therapy:    Observation     Interim History:  Ms. Ferger is back for follow-up. We see her yearly. She has had no specific complaints. However, she does state that she has had some yellowish discharge from the right breast. This is from the nipple. This is been going on for a couple weeks. It is not painful. There is no blood. There is no swelling.  She cannot remember who her surgeon was. I think we will have to refer her back.  There is no fever. She's had no issues with her back. Her back alloys bothers her. She had her surgery a year and half ago.  She has had no rashes. She has had an ulcer in the back of her left lower leg. She sees the wound care clinic.  She is on methotrexate for rheumatoid arthritis.   Overall, her performance status is ECOG 1.  Medications:  Current Outpatient Prescriptions:  .  albuterol (PROVENTIL HFA;VENTOLIN HFA) 108 (90 BASE) MCG/ACT inhaler, Inhale 2 puffs into the lungs every 4 (four) hours as needed for wheezing or shortness of breath (cough, shortness of breath or wheezing.)., Disp: 1 Inhaler, Rfl: 12 .  amLODipine (NORVASC) 5 MG tablet, Take 5 mg by mouth Daily. , Disp: , Rfl:  .  aspirin 81 MG tablet, Take 81 mg by mouth daily.  , Disp: , Rfl:  .  b complex vitamins tablet, Take 1 tablet by mouth 2 (two) times daily.  , Disp: , Rfl:  .  baclofen (LIORESAL) 10 MG tablet, TAKE 1 TABLET BY MOUTH TWICE A DAY AS NEEDED FOR SPASMS, Disp: 60 tablet, Rfl: 2 .  benazepril (LOTENSIN) 20 MG tablet, Take 20 mg by mouth daily. , Disp: , Rfl:  .  budesonide (PULMICORT) 0.5 MG/2ML nebulizer solution, Take 0.5 mg by nebulization 2 (two) times daily., Disp: , Rfl:  .  buPROPion (WELLBUTRIN XL) 150 MG 24 hr tablet, , Disp: , Rfl:  .  busPIRone (BUSPAR) 7.5 MG tablet, , Disp: , Rfl:   .  Cholecalciferol (VITAMIN D3) 2000 UNITS TABS, Take 2,000 Units by mouth., Disp: , Rfl:  .  collagenase (SANTYL) ointment, Apply 1 application topically daily., Disp: 15 g, Rfl: 0 .  [facility] ALCOHOL SWABS PADS, WIPE AREA WITH ALCOHOL PAD PRIOR TO GIVING METHOTREXATE INJECTION, Disp: 100 each, Rfl: 0 .  doxycycline (VIBRAMYCIN) 50 MG capsule, Take 2 capsules (100 mg total) by mouth 2 (two) times daily., Disp: 14 capsule, Rfl: 0 .  DULoxetine (CYMBALTA) 60 MG capsule, , Disp: , Rfl:  .  folic acid (FOLVITE) 1 MG tablet, TAKE 2 TABLETS BY MOUTH DAILY, Disp: 180 tablet, Rfl: 3 .  furosemide (LASIX) 20 MG tablet, Take 20 mg by mouth as needed.  , Disp: , Rfl:  .  gabapentin (NEURONTIN) 300 MG capsule, Take 600 mg by mouth 3 (three) times daily. , Disp: , Rfl:  .  hydrochlorothiazide 25 MG tablet, Take 25 mg by mouth daily.  , Disp: , Rfl:  .  hydroxychloroquine (PLAQUENIL) 200 MG tablet, TAKE 1 TABLET BY MOUTH TWICE A DAY MONDAY THROUGH FRIDAY, Disp: 120 tablet, Rfl: 1 .  loratadine (CLARITIN) 10 MG tablet, Take 10 mg by mouth daily., Disp: , Rfl:  .  meloxicam (MOBIC) 15 MG tablet, Take 15 mg by mouth  daily.  , Disp: , Rfl:  .  methotrexate 50 MG/2ML injection, INJECT 0.8 ML SUB-Q EVERY WEEK, Disp: 10 mL, Rfl: 0 .  Multiple Vitamin (MULTIVITAMIN) tablet, Take 1 tablet by mouth daily.  , Disp: , Rfl:  .  nebivolol (BYSTOLIC) 10 MG tablet, Take 10 mg by mouth daily.  , Disp: , Rfl:  .  omeprazole (PRILOSEC) 20 MG capsule, Take 20 mg by mouth daily. , Disp: , Rfl:  .  POTASSIUM PO, Take by mouth 2 (two) times daily. , Disp: , Rfl:  .  VITAMIN E PO, Take 200 mg by mouth daily. , Disp: , Rfl:   Allergies:  Allergies  Allergen Reactions  . Statins Other (See Comments)    Joint pain and swelling/burning    Past Medical History, Surgical history, Social history, and Family History were reviewed and updated.  Review of Systems: As above  Physical Exam:  weight is 254 lb (115.2 kg). Her oral  temperature is 98.5 F (36.9 C). Her blood pressure is 180/93 (abnormal) and her pulse is 73. Her respiration is 18 and oxygen saturation is 97%.   Wt Readings from Last 3 Encounters:  12/29/16 254 lb (115.2 kg)  12/28/16 259 lb (117.5 kg)  11/21/16 259 lb (117.5 kg)     Obese white female in no obvious distress. Head and neck exam shows no ocular or oral lesions. There are no palpable cervical or supraclavicular lymph nodes. Lungs are clear. No rales, wheezes or rhonchi are noted. Cardiac exam regular rate and rhythm with no murmurs, rubs or bruits. Breast exam shows left breast with no masses, edema or erythema. There is no left axillary adenopathy. Right breast shows the lumpectomy at the 7:00 position. She has radiation changes on the right breast. No distinct masses noted on the right breast. She has some crusting about the areola. She has no right axillary adenopathy. Abdomen is obese but soft. She has good bowel sounds. There is no fluid wave. There is no palpable liver or spleen tip. Back exam shows no tenderness over the spine, ribs or hips. She has a healed laminectomy scar in the lumbar spine. Extremities shows no clubbing, cyanosis or edema. She does have some chronic brawny type edema in her lower legs. She has a bandage on the back of her left lower leg. She has some varicose veins. Neurological exam shows no focal neurological deficits.  Lab Results  Component Value Date   WBC 6.7 12/29/2016   HGB 13.7 12/29/2016   HCT 41.1 12/29/2016   MCV 94 12/29/2016   PLT 176 12/29/2016     Chemistry      Component Value Date/Time   NA 141 12/15/2016 1102   NA 142 12/24/2015 1137   K 4.0 12/15/2016 1102   K 4.2 12/24/2015 1137   CL 103 12/15/2016 1102   CO2 27 12/15/2016 1102   CO2 29 12/24/2015 1137   BUN 13 12/15/2016 1102   BUN 16.3 12/24/2015 1137   CREATININE 0.70 12/15/2016 1102   CREATININE 0.7 12/24/2015 1137      Component Value Date/Time   CALCIUM 9.2 12/15/2016 1102     CALCIUM 9.8 12/24/2015 1137   ALKPHOS 52 12/15/2016 1102   ALKPHOS 54 12/24/2015 1137   AST 18 12/15/2016 1102   AST 19 12/24/2015 1137   ALT 17 12/15/2016 1102   ALT 21 12/24/2015 1137   BILITOT 0.5 12/15/2016 1102   BILITOT 0.48 12/24/2015 1137  Impression and Plan: Ms. Dardis is 71 year old white female with a past history of infiltrating ductal carcinoma the right breast. She has stage I disease. She now is out from surgery and treatment by 19 years.  Again, we will have to refer her back to general surgery so that they can take a look at this right breast. I would not think that this is anything that would suggest recurrent disease. 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   I will plan to get her back in one year.   Volanda Napoleon, MD 6/7/201812:06 PM

## 2016-12-29 NOTE — Telephone Encounter (Signed)
Doly with [facility] calling to get clarification on Rx for Santyl.  CB# is [phone]

## 2016-12-29 NOTE — Telephone Encounter (Signed)
Sent referral per last OV note by PW

## 2016-12-29 NOTE — Telephone Encounter (Signed)
Patient called stating that she is still having trouble with her right shoulder and wants to go ahead and schedule her MRI.  CB#[phone].  Thank you.

## 2016-12-29 NOTE — Telephone Encounter (Signed)
Called [name] back and she states if he can send in 30g instead they cannot do only 15 g. Order sent into pharm.

## 2017-01-12 ENCOUNTER — Encounter (INDEPENDENT_AMBULATORY_CARE_PROVIDER_SITE_OTHER): Payer: Self-pay | Admitting: Orthopedic Surgery

## 2017-01-12 ENCOUNTER — Ambulatory Visit (INDEPENDENT_AMBULATORY_CARE_PROVIDER_SITE_OTHER): Payer: PPO | Admitting: Orthopedic Surgery

## 2017-01-12 VITALS — Ht 65.0 in | Wt 254.0 lb

## 2017-01-12 DIAGNOSIS — L97929 Non-pressure chronic ulcer of unspecified part of left lower leg with unspecified severity: Principal | ICD-10-CM

## 2017-01-12 DIAGNOSIS — G4733 Obstructive sleep apnea (adult) (pediatric): Secondary | ICD-10-CM | POA: Diagnosis not present

## 2017-01-12 DIAGNOSIS — I10 Essential (primary) hypertension: Secondary | ICD-10-CM | POA: Diagnosis not present

## 2017-01-12 DIAGNOSIS — I2510 Atherosclerotic heart disease of native coronary artery without angina pectoris: Secondary | ICD-10-CM | POA: Diagnosis not present

## 2017-01-12 DIAGNOSIS — E1151 Type 2 diabetes mellitus with diabetic peripheral angiopathy without gangrene: Secondary | ICD-10-CM | POA: Diagnosis not present

## 2017-01-12 DIAGNOSIS — R2242 Localized swelling, mass and lump, left lower limb: Secondary | ICD-10-CM | POA: Diagnosis not present

## 2017-01-12 DIAGNOSIS — S81802A Unspecified open wound, left lower leg, initial encounter: Secondary | ICD-10-CM

## 2017-01-12 DIAGNOSIS — Z6841 Body Mass Index (BMI) 40.0 and over, adult: Secondary | ICD-10-CM | POA: Diagnosis not present

## 2017-01-12 DIAGNOSIS — C50919 Malignant neoplasm of unspecified site of unspecified female breast: Secondary | ICD-10-CM | POA: Diagnosis not present

## 2017-01-12 DIAGNOSIS — I87333 Chronic venous hypertension (idiopathic) with ulcer and inflammation of bilateral lower extremity: Secondary | ICD-10-CM | POA: Diagnosis not present

## 2017-01-12 DIAGNOSIS — M069 Rheumatoid arthritis, unspecified: Secondary | ICD-10-CM | POA: Diagnosis not present

## 2017-01-12 DIAGNOSIS — L97919 Non-pressure chronic ulcer of unspecified part of right lower leg with unspecified severity: Secondary | ICD-10-CM

## 2017-01-12 NOTE — Progress Notes (Signed)
Office Visit Note   Patient: [name]           Date of Birth: September 22, 1945           MRN: [id number] Visit Date: 01/12/2017              Requested by: Crist Infante, MD [address] [place], [place] [address] PCP: Crist Infante, MD  Chief Complaint  Patient presents with  . Right Leg - Follow-up  . Left Leg - Follow-up      HPI: Patient is 71 year old woman who has used a 2 layer medical compression stocking to heal a venous stasis ulcer left leg she has also undergone laser therapy to the left leg and she was wondering if the ulceration develops secondary to the laser therapy.  Assessment & Plan: Visit Diagnoses:  1. Idiopathic chronic venous hypertension of both lower extremities with ulcer and inflammation (HCC)   2. Open wound of left lower leg, initial encounter     Plan: Recommended the single layer compression stocking if she is out about walking and using her muscles to help with circulation. Discussed that if she is more sedentary she should use the 2 layer compression stockings during the day. If she develops any further ulcerations or any changes she should follow-up immediately.  Follow-Up Instructions: Return if symptoms worsen or fail to improve.   Ortho Exam  Patient is alert, oriented, no adenopathy, well-dressed, normal affect, normal respiratory effort. Examination patient has difficulty getting from a sitting to a standing position. She has an antalgic gait. She has brawny skin color changes with varicose veins both lower extremities. The ulcer on the left leg is essentially healed it is 3 mm in diameter 0.1 mm deep with superficial epithelialization there is no drainage no odor no signs of infection.  Imaging: No results found.  Labs: Lab Results  Component Value Date   HGBA1C 6.2 (H) 09/03/2015   HGBA1C 6.5 (H) 04/15/2011   HGBA1C (H) 08/06/2010    6.5 (NOTE)                                                                       According to the  ADA Clinical Practice Recommendations for 2011, when HbA1c is used as a screening test:   >=6.5%   Diagnostic of Diabetes Mellitus           (if abnormal result  is confirmed)  5.7-6.4%   Increased risk of developing Diabetes Mellitus  References:Diagnosis and Classification of Diabetes Mellitus,Diabetes OEUM,3536,14(ERXVQ 1):S62-S69 and Standards of Medical Care in         Diabetes - 2011,Diabetes Care,2011,34  (Suppl 1):S11-S61.   REPTSTATUS 07/29/2010 FINAL 07/28/2010   CULT NO GROWTH 07/28/2010    Orders:  No orders of the defined types were placed in this encounter.  No orders of the defined types were placed in this encounter.    Procedures: No procedures performed  Clinical Data: No additional findings.  ROS:  All other systems negative, except as noted in the HPI. Review of Systems  Objective: Vital Signs: Ht 5\' 5"  (1.651 m)   Wt 254 lb (115.2 kg)   BMI 42.27 kg/m   Specialty Comments:  No specialty comments available.  PMFS History: Patient Active Problem List   Diagnosis Date Noted  . Open wound of left lower leg 12/26/2016  . Vitamin D deficiency 09/14/2016  . History of total knee replacement, bilateral 09/14/2016  . DJD (degenerative joint disease), cervical 09/14/2016  . 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 High risk medication use 05/20/2016  . Spinal stenosis, lumbar region, with neurogenic claudication 09/08/2015    Class: Chronic  . OSA (obstructive sleep apnea) 10/17/2013  . Dyspnea 09/04/2013  . Atypical chest pain 09/04/2013  . Morbid obesity ([place]) 03/21/2012  . Cancer of lower-outer quadrant of female breast ([place]) 03/15/2012  . Diabetes mellitus type II 02/22/2011  . Coronary artery disease   . Hypertension   . Hyperlipidemia   . OA (osteoarthritis) of knee    Past Medical History:  Diagnosis Date  . Asthma    Albuterol inhaler as needed.Pulmicort neb as needed  . Asthma   . Breast cancer ([place])    . Chronic back pain    spinal stenosis  . Coronary artery disease   . Depression    takes CYmbalta and Wellbutrin daily  . Diabetes mellitus    not on any meds/controlled by diet  . Dyspnea    with exertion occasionally when lies down  . GERD (gastroesophageal reflux disease)    takes Omeprazole daily  . Headache    oocasionally  . History of kidney stones   . Hyperlipidemia    not on any meds  . Hypertension    takes Benazepril,Bystolic,and Amlodipine daily  . IBS (irritable bowel syndrome)   . Joint pain   . Joint swelling   . Nocturia   . OA (osteoarthritis) of knee   . OSA (obstructive sleep apnea)   . Peripheral edema    takes daily as needed  . Peripheral neuropathy   . Pneumonia    hx of-several yrs ago  . RA (rheumatoid arthritis) ([place])   . Urinary frequency   . Urinary urgency   . Weakness    numbness and tingling mainly in left leg occasionally in right.Tingling/numbness in hands    Family History  Problem Relation Age of Onset  . Heart disease Mother   . Cancer Father   . Cancer Brother   . Heart disease Brother     Past Surgical History:  Procedure Laterality Date  . BREAST LUMPECTOMY     RIGHT BREAST  . CARDIAC CATHETERIZATION  04/23/2004   EF 60%  . cataract surgery Bilateral   . COLONOSCOPY    . CORONARY ARTERY BYPASS GRAFT  2005   LIMA GRAFT TO LAD, SAPHENOUS VEIN GRAFT TO THE FIRST DIAGONAL, AND LEFT RADIAL ARTERY GRAFT TO THE OM  . FOOT SURGERY Right   . JOINT REPLACEMENT    . LITHOTRIPSY    . LUMBAR LAMINECTOMY/DECOMPRESSION MICRODISCECTOMY N/A 09/08/2015   Procedure: CENTRAL DECOMPRESSIVE LUMBAR LAMINECTOMIES L3-4, L4-5 AND BILATERAL HEMILAMINECTOMY L5-S1;  Surgeon: Jessy Oto, MD;  Location: [facility];  Service: Orthopedics;  Laterality: N/A;  . nodule removed from left elbow    . TOTAL KNEE ARTHROPLASTY Bilateral   . TRIPLE BYPASS  04/27/05  . US ECHOCARDIOGRAPHY  01/06/2009   EF 55-60%  . WRIST SURGERY Left    Social History    Occupational History  . retired Unemployed   Social History Main Topics  . Smoking status: Never Smoker  . Smokeless tobacco: Never Used  . Alcohol use No  . Drug use: No  .  Sexual activity: Not on file

## 2017-01-18 ENCOUNTER — Encounter: Payer: Self-pay | Admitting: Gastroenterology

## 2017-01-23 DIAGNOSIS — R351 Nocturia: Secondary | ICD-10-CM | POA: Diagnosis not present

## 2017-01-23 DIAGNOSIS — Z6841 Body Mass Index (BMI) 40.0 and over, adult: Secondary | ICD-10-CM | POA: Diagnosis not present

## 2017-01-23 DIAGNOSIS — Z124 Encounter for screening for malignant neoplasm of cervix: Secondary | ICD-10-CM | POA: Diagnosis not present

## 2017-01-23 DIAGNOSIS — N3281 Overactive bladder: Secondary | ICD-10-CM | POA: Diagnosis not present

## 2017-01-25 ENCOUNTER — Other Ambulatory Visit: Payer: Self-pay | Admitting: Rheumatology

## 2017-01-26 NOTE — Telephone Encounter (Signed)
Last Visit: 09/27/16 Next Visit: 02/28/17  Okay to refill per Dr. Estanislado Pandy

## 2017-01-27 ENCOUNTER — Other Ambulatory Visit: Payer: Self-pay | Admitting: Rheumatology

## 2017-01-27 NOTE — Telephone Encounter (Signed)
Last Visit: 09/27/16 Next Visit: 02/28/17 Labs: 12/15/16 Elevated glucose PLQ eye exam: 05/20/16  Okay to refill per Dr. Estanislado Pandy

## 2017-01-29 ENCOUNTER — Ambulatory Visit
Admission: RE | Admit: 2017-01-29 | Discharge: 2017-01-29 | Disposition: A | Discharge status: Home / Self Care | Payer: PPO | Source: Ambulatory Visit | Attending: Orthopaedic Surgery | Admitting: Orthopaedic Surgery

## 2017-01-29 DIAGNOSIS — M25411 Effusion, right shoulder: Secondary | ICD-10-CM | POA: Diagnosis not present

## 2017-01-29 DIAGNOSIS — G8929 Other chronic pain: Secondary | ICD-10-CM

## 2017-01-29 DIAGNOSIS — M25511 Pain in right shoulder: Principal | ICD-10-CM

## 2017-01-31 ENCOUNTER — Encounter (INDEPENDENT_AMBULATORY_CARE_PROVIDER_SITE_OTHER): Payer: Self-pay | Admitting: Orthopaedic Surgery

## 2017-01-31 ENCOUNTER — Ambulatory Visit (INDEPENDENT_AMBULATORY_CARE_PROVIDER_SITE_OTHER): Payer: PPO | Admitting: Orthopaedic Surgery

## 2017-01-31 VITALS — BP 136/77 | HR 74 | Resp 74 | Ht 64.0 in | Wt 245.0 lb

## 2017-01-31 DIAGNOSIS — M25511 Pain in right shoulder: Secondary | ICD-10-CM

## 2017-01-31 DIAGNOSIS — G8929 Other chronic pain: Secondary | ICD-10-CM

## 2017-01-31 NOTE — Progress Notes (Signed)
Office Visit Note   Patient: [name]           Date of Birth: [birth year]           MRN: [id number] Visit Date: 01/31/2017              Requested by: Crist Infante, MD [address] [place], [place] [address] PCP: Crist Infante, MD   Assessment & Plan: Visit Diagnoses:  1. Chronic right shoulder pain   MRI scan demonstrates complete tearing of the infra and supraspinatus tendon right shoulder associated with a tear of the biceps tendon. There is also mild degenerative arthritis of the glenohumeral joint and spurring of the clavicular joint both tendons may be retracted 3-4 cm  Plan: Long discussion regarding findings by the MRI scan and treatment options. I specifically discussed arthroscopic subacromial decompression and distal clavicle resection. I would attempt to repair both the infra and supraspinatus if possible and if not significantly retracted. She might be a candidate for a patch. She is fully aware that this may or may not make much of a difference. If she decides she like to proceed she would have to have cardiology clearance by Dr. Martinique that she's had a prior bypass. I've given her cardiology clearance form Follow-Up Instructions: Return will schedule surgery.   Orders:  No orders of the defined types were placed in this encounter.  No orders of the defined types were placed in this encounter.     Procedures: No procedures performed   Clinical Data: No additional findings.   Subjective: Chief Complaint  Patient presents with  . Right Shoulder - Results    Pt is here today for R shoulder MRI results  [name] relates at least a several month history of right shoulder pain. She is not exactly sure of cause of her pain although she's had several falls over the last several months. She is having more and more difficulty raising her right dominant arm over her head: Repair of wash her face etc. she is having difficulty sleeping on that side. Quite a bit of  pain along the anterior aspect of her shoulder with referred discomfort into the biceps. He did order an MRI scan. This study was performed on July 8. It demonstrates a full-thickness full width supraspinatus rupture with about a 4 cm retraction. Is also a full-thickness full width infraspinatus tendon tear retracted about the same. His mild partial thickness tearing of the subscapularis tendon and mild teres minor tendinopathy. There is mild infra and supraspinatus atrophy and there is type II acromion. The humeral head is subluxed superiorly  HPI  Review of Systems   Objective: Vital Signs: BP 136/77   Pulse 74   Resp (!) 74   Ht 5\' 4"  (1.626 m)   Wt 245 lb (111.1 kg)   BMI 42.05 kg/m   Physical Exam  Ortho Exam positive impingement right shoulder. Skin intact. No swelling or evidence of cellulitis.She has had a prior lumpectomy and lymph node dissection and. On occasion she's had cellulitis of the right upper extremity. She has considerable weakness with external rotation. None with internal rotation. Positive speed sign. Positive empty can testing. She can actively raise her arm over her head but is weak. Pain over the biceps tendon beneath the subacromial region. Good grip and good release. No pain with range of motion of cervical spine  Specialty Comments:  No specialty comments available.  Imaging: No results found.   PMFS History: Patient Active  Problem List   Diagnosis Date Noted  . Open wound of left lower leg 12/26/2016  . Vitamin D deficiency 09/14/2016  . History of total knee replacement, bilateral 09/14/2016  . DJD (degenerative joint disease), cervical 09/14/2016  . 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 High risk medication use 05/20/2016  . Spinal stenosis, lumbar region, with neurogenic claudication 09/08/2015    Class: Chronic  . OSA (obstructive sleep apnea) 10/17/2013  . Dyspnea 09/04/2013  . Atypical chest pain  09/04/2013  . Morbid obesity ([place]) 03/21/2012  . Cancer of lower-outer quadrant of female breast ([place]) 03/15/2012  . Diabetes mellitus type II 02/22/2011  . Coronary artery disease   . Hypertension   . Hyperlipidemia   . OA (osteoarthritis) of knee    Past Medical History:  Diagnosis Date  . Asthma    Albuterol inhaler as needed.Pulmicort neb as needed  . Asthma   . Breast cancer ([place])   . Chronic back pain    spinal stenosis  . Coronary artery disease   . Depression    takes CYmbalta and Wellbutrin daily  . Diabetes mellitus    not on any meds/controlled by diet  . Dyspnea    with exertion occasionally when lies down  . GERD (gastroesophageal reflux disease)    takes Omeprazole daily  . Headache    oocasionally  . History of kidney stones   . Hyperlipidemia    not on any meds  . Hypertension    takes Benazepril,Bystolic,and Amlodipine daily  . IBS (irritable bowel syndrome)   . Joint pain   . Joint swelling   . Nocturia   . OA (osteoarthritis) of knee   . OSA (obstructive sleep apnea)   . Peripheral edema    takes daily as needed  . Peripheral neuropathy   . Pneumonia    hx of-several yrs ago  . RA (rheumatoid arthritis) ([place])   . Urinary frequency   . Urinary urgency   . Weakness    numbness and tingling mainly in left leg occasionally in right.Tingling/numbness in hands    Family History  Problem Relation Age of Onset  . Heart disease Mother   . Cancer Father   . Cancer Brother   . Heart disease Brother     Past Surgical History:  Procedure Laterality Date  . BREAST LUMPECTOMY     RIGHT BREAST  . CARDIAC CATHETERIZATION  04/23/2004   EF 60%  . cataract surgery Bilateral   . COLONOSCOPY    . CORONARY ARTERY BYPASS GRAFT  2005   LIMA GRAFT TO LAD, SAPHENOUS VEIN GRAFT TO THE FIRST DIAGONAL, AND LEFT RADIAL ARTERY GRAFT TO THE OM  . FOOT SURGERY Right   . JOINT REPLACEMENT    . LITHOTRIPSY    . LUMBAR LAMINECTOMY/DECOMPRESSION MICRODISCECTOMY N/A  09/08/2015   Procedure: CENTRAL DECOMPRESSIVE LUMBAR LAMINECTOMIES L3-4, L4-5 AND BILATERAL HEMILAMINECTOMY L5-S1;  Surgeon: Jessy Oto, MD;  Location: [facility];  Service: Orthopedics;  Laterality: N/A;  . nodule removed from left elbow    . TOTAL KNEE ARTHROPLASTY Bilateral   . TRIPLE BYPASS  04/27/05  . US ECHOCARDIOGRAPHY  01/06/2009   EF 55-60%  . WRIST SURGERY Left    Social History   Occupational History  . retired Unemployed   Social History Main Topics  . Smoking status: Never Smoker  . Smokeless tobacco: Never Used  . Alcohol use No  . Drug use: No  . Sexual activity: Not  on file     Garald Balding, MD   Note - This record has been created using [employer].  Chart creation errors have been sought, but may not always  have been located. Such creation errors do not reflect on  the standard of medical care.

## 2017-02-03 ENCOUNTER — Telehealth: Payer: Self-pay | Admitting: Cardiology

## 2017-02-03 NOTE — Telephone Encounter (Signed)
New Message     Pt is returning [place] call for surgical clearance for Dr Durward Fortes

## 2017-02-03 NOTE — Telephone Encounter (Signed)
Spoke with patient. She states she calling back  need clearance for right rotator cuff,tendon with Dr Durward Fortes. PATIENT STATES she sent/faxed form.   RN informed patient will defer to cheryl . She will be in the office on Monday 02/06/17. PATIENT VERBALIZED UNDERSTANDING.

## 2017-02-06 ENCOUNTER — Telehealth (INDEPENDENT_AMBULATORY_CARE_PROVIDER_SITE_OTHER): Payer: Self-pay | Admitting: Orthopaedic Surgery

## 2017-02-06 NOTE — Telephone Encounter (Signed)
Spoke to Dr.Whitfield's office this morning.I never received a surgical clearance form.Surgical clearance form to be refaxed. Dr.Jordan cleared patient for upcoming surgery.Surgical clearance letter faxed to Dr.Whitfield at fax # [phone].

## 2017-02-06 NOTE — Telephone Encounter (Signed)
The patient has been called back and informed that she will be hearing back from the Dr. Doug Sou nurse as soon as it is done. She verbalized her appreciation and understanding.

## 2017-02-06 NOTE — Telephone Encounter (Signed)
Dr. Doug Sou office called stating that they did not get the medical clearance form for this patient.  If you would please refax it to [phone].  Thank you.

## 2017-02-06 NOTE — Telephone Encounter (Signed)
Follow up ° ° ° ° ° °Returning a call to the nurse °

## 2017-02-06 NOTE — Telephone Encounter (Signed)
Faxed [phone]

## 2017-02-13 ENCOUNTER — Telehealth (INDEPENDENT_AMBULATORY_CARE_PROVIDER_SITE_OTHER): Payer: Self-pay | Admitting: Orthopaedic Surgery

## 2017-02-13 ENCOUNTER — Ambulatory Visit: Payer: PPO | Admitting: Rheumatology

## 2017-02-13 ENCOUNTER — Encounter: Payer: Self-pay | Admitting: Rheumatology

## 2017-02-13 ENCOUNTER — Ambulatory Visit (INDEPENDENT_AMBULATORY_CARE_PROVIDER_SITE_OTHER): Payer: PPO | Admitting: Rheumatology

## 2017-02-13 VITALS — BP 174/81 | HR 70 | Ht 64.0 in | Wt 256.0 lb

## 2017-02-13 DIAGNOSIS — Z8679 Personal history of other diseases of the circulatory system: Secondary | ICD-10-CM

## 2017-02-13 DIAGNOSIS — M0609 Rheumatoid arthritis without rheumatoid factor, multiple sites: Secondary | ICD-10-CM | POA: Diagnosis not present

## 2017-02-13 DIAGNOSIS — Z79899 Other long term (current) drug therapy: Secondary | ICD-10-CM | POA: Diagnosis not present

## 2017-02-13 DIAGNOSIS — Z8639 Personal history of other endocrine, nutritional and metabolic disease: Secondary | ICD-10-CM | POA: Diagnosis not present

## 2017-02-13 MED ORDER — METHOTREXATE SODIUM CHEMO INJECTION 50 MG/2ML: INTRAMUSCULAR | 0 refills | Status: DC

## 2017-02-13 MED ORDER — FOLIC ACID 1 MG PO TABS: 2.0000 mg | ORAL_TABLET | Freq: Every day | ORAL | 4 refills | Status: DC

## 2017-02-13 NOTE — Patient Instructions (Signed)
  Call our office for new fax number.

## 2017-02-13 NOTE — Telephone Encounter (Signed)
Patient wanting to know if we received release from her heart doctor so she can schedule shoulder surgery. Patient states heart doctor has faxed a release twice now. Please call patient to advise.

## 2017-02-13 NOTE — Progress Notes (Signed)
Office Visit Note  Patient: [name]             Date of Birth: August 22, 1945           MRN: [id number]             PCP: Crist Infante, MD Referring: Crist Infante, MD Visit Date: 02/13/2017 Occupation: @GUAROCC @    Subjective:  Follow-up (RA GETTING WORSE) ongoing back and neck pain.  History of Present Illness: [name] is a 71 y.o. female  Was last seen 09/27/2016 for rheumatoid arthritis (seronegative), methotrexate 0.8 ML's every week, folic acid 2 mg daily; Plaquenil 20 mg twice a day Monday through Friday.  Ongoing low back and neck pain --> will see dr Louanne Skye.  Right sj surgery scheduled w/ dr whitfield soon.  Patient is doing well with her rheumatoid arthritis. She's taking medication as prescribed without any problems.    Activities of Daily Living:  Patient reports morning stiffness for 15 minutes.   Patient Denies nocturnal pain.  Difficulty dressing/grooming: Denies Difficulty climbing stairs: Denies Difficulty getting out of chair: Denies Difficulty using hands for taps, buttons, cutlery, and/or writing: Denies   Review of Systems  Constitutional: Negative for fatigue.  HENT: Negative for mouth sores and mouth dryness.   Eyes: Negative for dryness.  Respiratory: Negative for shortness of breath.   Gastrointestinal: Negative for constipation and diarrhea.  Musculoskeletal: Negative for myalgias and myalgias.  Skin: Negative for sensitivity to sunlight.  Psychiatric/Behavioral: Negative for decreased concentration and sleep disturbance.    PMFS History:  Patient Active Problem List   Diagnosis Date Noted  . Open wound of left lower leg 12/26/2016  . Vitamin D deficiency 09/14/2016  . History of total knee replacement, bilateral 09/14/2016  . DJD (degenerative joint disease), cervical 09/14/2016  . 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 High risk medication use 05/20/2016  . Spinal stenosis, lumbar  region, with neurogenic claudication 09/08/2015    Class: Chronic  . OSA (obstructive sleep apnea) 10/17/2013  . Dyspnea 09/04/2013  . Atypical chest pain 09/04/2013  . Morbid obesity ([place]) 03/21/2012  . Cancer of lower-outer quadrant of female breast ([place]) 03/15/2012  . Diabetes mellitus type II 02/22/2011  . Coronary artery disease   . Hypertension   . Hyperlipidemia   . OA (osteoarthritis) of knee     Past Medical History:  Diagnosis Date  . Asthma    Albuterol inhaler as needed.Pulmicort neb as needed  . Asthma   . Breast cancer ([place])   . Chronic back pain    spinal stenosis  . Coronary artery disease   . Depression    takes CYmbalta and Wellbutrin daily  . Diabetes mellitus    not on any meds/controlled by diet  . Dyspnea    with exertion occasionally when lies down  . GERD (gastroesophageal reflux disease)    takes Omeprazole daily  . Headache    oocasionally  . History of kidney stones   . Hyperlipidemia    not on any meds  . Hypertension    takes Benazepril,Bystolic,and Amlodipine daily  . IBS (irritable bowel syndrome)   . Joint pain   . Joint swelling   . Nocturia   . OA (osteoarthritis) of knee   . OSA (obstructive sleep apnea)   . Peripheral edema    takes daily as needed  . Peripheral neuropathy   . Pneumonia    hx of-several yrs ago  . RA (  rheumatoid arthritis) ([place])   . Urinary frequency   . Urinary urgency   . Weakness    numbness and tingling mainly in left leg occasionally in right.Tingling/numbness in hands    Family History  Problem Relation Age of Onset  . Heart disease Mother   . Cancer Father   . Cancer Brother   . Heart disease Brother    Past Surgical History:  Procedure Laterality Date  . BREAST LUMPECTOMY     RIGHT BREAST  . CARDIAC CATHETERIZATION  04/23/2004   EF 60%  . cataract surgery Bilateral   . COLONOSCOPY    . CORONARY ARTERY BYPASS GRAFT  2005   LIMA GRAFT TO LAD, SAPHENOUS VEIN GRAFT TO THE FIRST DIAGONAL, AND  LEFT RADIAL ARTERY GRAFT TO THE OM  . FOOT SURGERY Right   . JOINT REPLACEMENT    . LITHOTRIPSY    . LUMBAR LAMINECTOMY/DECOMPRESSION MICRODISCECTOMY N/A 09/08/2015   Procedure: CENTRAL DECOMPRESSIVE LUMBAR LAMINECTOMIES L3-4, L4-5 AND BILATERAL HEMILAMINECTOMY L5-S1;  Surgeon: Jessy Oto, MD;  Location: [facility] [facility];  Service: Orthopedics;  Laterality: N/A;  . nodule removed from left elbow    . TOTAL KNEE ARTHROPLASTY Bilateral   . TRIPLE BYPASS  04/27/05  . US ECHOCARDIOGRAPHY  01/06/2009   EF 55-60%  . WRIST SURGERY Left    Social History   Social History Narrative  . No narrative on file     Objective: Vital Signs: BP (!) 174/81 (BP Location: Left Arm, Patient Position: Sitting, Cuff Size: Normal)   Pulse 70   Ht 5' 4"  (1.626 m)   Wt 256 lb (116.1 kg)   BMI 43.94 kg/m    Physical Exam  Constitutional: She is oriented to person, place, and time. She appears well-developed and well-nourished.  HENT:  Head: Normocephalic and atraumatic.  Eyes: Pupils are equal, round, and reactive to light. EOM are normal.  Cardiovascular: Normal rate, regular rhythm and normal heart sounds.  Exam reveals no gallop and no friction rub.   No murmur heard. Pulmonary/Chest: Effort normal and breath sounds normal. She has no wheezes. She has no rales.  Abdominal: Soft. Bowel sounds are normal. She exhibits no distension. There is no tenderness. There is no guarding. No hernia.  Musculoskeletal: Normal range of motion. She exhibits no edema, tenderness or deformity.  Lymphadenopathy:    She has no cervical adenopathy.  Neurological: She is alert and oriented to person, place, and time. Coordination normal.  Skin: Skin is warm and dry. Capillary refill takes less than 2 seconds. No rash noted.  Psychiatric: She has a normal mood and affect. Her behavior is normal.  Nursing note and vitals reviewed.    Musculoskeletal Exam:  Full range of motion of all joints Except decreased range of motion of  right shoulder joint secondary to right shoulder joint pain Grip strength is equal and strong bilaterally Fiber myalgia tender points are absent  CDAI Exam: CDAI Homunculus Exam:   Joint Counts:  CDAI Tender Joint count: 0 CDAI Swollen Joint count: 0  No synovitis on examination except the right second and third MCP with synovial thickening but no warmth or redness or acute swelling from synovitis   Investigation: No additional findings. Appointment on 12/29/2016  Component Date Value Ref Range Status  . WBC 12/29/2016 6.7  3.9 - 10.0 10e3/uL Final  . RBC 12/29/2016 4.38  3.70 - 5.32 10e6/uL Final  . HGB 12/29/2016 13.7  11.6 - 15.9 g/dL Final  . HCT 12/29/2016 41.1  34.8 - 46.6 % Final  . MCV 12/29/2016 94  81 - 101 fL Final  . MCH 12/29/2016 31.3  26.0 - 34.0 pg Final  . MCHC 12/29/2016 33.3  32.0 - 36.0 g/dL Final  . RDW 12/29/2016 14.0  11.1 - 15.7 % Final  . Platelets 12/29/2016 176  145 - 400 10e3/uL Final  . NEUT# 12/29/2016 3.5  1.5 - 6.5 10e3/uL Final  . LYMPH# 12/29/2016 2.3  0.9 - 3.3 10e3/uL Final  . MONO# 12/29/2016 0.8  0.1 - 0.9 10e3/uL Final  . Eosinophils Absolute 12/29/2016 0.1  0.0 - 0.5 10e3/uL Final  . BASO# 12/29/2016 0.0  0.0 - 0.2 10e3/uL Final  . NEUT% 12/29/2016 52.5  39.6 - 80.0 % Final  . LYMPH% 12/29/2016 33.8  14.0 - 48.0 % Final  . MONO% 12/29/2016 12.5  0.0 - 13.0 % Final  . EOS% 12/29/2016 0.9  0.0 - 7.0 % Final  . BASO% 12/29/2016 0.3  0.0 - 2.0 % Final  . Sodium 12/29/2016 142  136 - 145 mEq/L Final  . Potassium 12/29/2016 3.7  3.5 - 5.1 mEq/L Final  . Chloride 12/29/2016 103  98 - 109 mEq/L Final  . CO2 12/29/2016 29  22 - 29 mEq/L Final  . Glucose 12/29/2016 146* 70 - 140 mg/dl Final   Glucose reference range is for nonfasting patients. Fasting glucose reference range is 70- 100.  Marland Kitchen BUN 12/29/2016 16.3  7.0 - 26.0 mg/dL Final  . Creatinine 12/29/2016 0.7  0.6 - 1.1 mg/dL Final  . Total Bilirubin 12/29/2016 0.52  0.20 - 1.20 mg/dL Final   . Alkaline Phosphatase 12/29/2016 61  40 - 150 U/L Final  . AST 12/29/2016 23  5 - 34 U/L Final  . ALT 12/29/2016 23  0 - 55 U/L Final  . Total Protein 12/29/2016 7.2  6.4 - 8.3 g/dL Final  . Albumin 12/29/2016 4.1  3.5 - 5.0 g/dL Final  . Calcium 12/29/2016 9.9  8.4 - 10.4 mg/dL Final  . Anion Gap 12/29/2016 10  3 - 11 mEq/L Final  . EGFR 12/29/2016 84* >90 ml/min/1.73 m2 Final   eGFR is calculated using the CKD-EPI Creatinine Equation (2009)  Orders Only on 12/15/2016  Component Date Value Ref Range Status  . WBC 12/15/2016 6.5  3.8 - 10.8 K/uL Final  . RBC 12/15/2016 4.32  3.80 - 5.10 MIL/uL Final  . Hemoglobin 12/15/2016 13.1  11.7 - 15.5 g/dL Final  . HCT 12/15/2016 39.4  35.0 - 45.0 % Final  . MCV 12/15/2016 91.2  80.0 - 100.0 fL Final  . MCH 12/15/2016 30.3  27.0 - 33.0 pg Final  . MCHC 12/15/2016 33.2  32.0 - 36.0 g/dL Final  . RDW 12/15/2016 14.9  11.0 - 15.0 % Final  . Platelets 12/15/2016 197  140 - 400 K/uL Final  . MPV 12/15/2016 10.4  7.5 - 12.5 fL Final  . Neutro Abs 12/15/2016 3640  1,500 - 7,800 cells/uL Final  . Lymphs Abs 12/15/2016 2340  850 - 3,900 cells/uL Final  . Monocytes Absolute 12/15/2016 455  200 - 950 cells/uL Final  . Eosinophils Absolute 12/15/2016 65  15 - 500 cells/uL Final  . Basophils Absolute 12/15/2016 0  0 - 200 cells/uL Final  . Neutrophils Relative % 12/15/2016 56  % Final  . Lymphocytes Relative 12/15/2016 36  % Final  . Monocytes Relative 12/15/2016 7  % Final  . Eosinophils Relative 12/15/2016 1  % Final  . Basophils Relative 12/15/2016  0  % Final  . Smear Review 12/15/2016 Criteria for review not met   Final  . Sodium 12/15/2016 141  135 - 146 mmol/L Final  . Potassium 12/15/2016 4.0  3.5 - 5.3 mmol/L Final  . Chloride 12/15/2016 103  98 - 110 mmol/L Final  . CO2 12/15/2016 27  20 - 31 mmol/L Final  . Glucose, Bld 12/15/2016 195* 65 - 99 mg/dL Final  . BUN 12/15/2016 13  7 - 25 mg/dL Final  . Creat 12/15/2016 0.70  0.60 - 0.93 mg/dL  Final   Comment:   For patients > or = 72 years of age: The upper reference limit for Creatinine is approximately 13% higher for people identified as African-American.     . Total Bilirubin 12/15/2016 0.5  0.2 - 1.2 mg/dL Final  . Alkaline Phosphatase 12/15/2016 52  33 - 130 U/L Final  . AST 12/15/2016 18  10 - 35 U/L Final  . ALT 12/15/2016 17  6 - 29 U/L Final  . Total Protein 12/15/2016 6.3  6.1 - 8.1 g/dL Final  . Albumin 12/15/2016 4.1  3.6 - 5.1 g/dL Final  . Calcium 12/15/2016 9.2  8.6 - 10.4 mg/dL Final  . GFR, Est African American 12/15/2016 >89  >=60 mL/min Final  . GFR, Est Non African American 12/15/2016 87  >=60 mL/min Final  Hospital Outpatient Visit on 09/28/2016  Component Date Value Ref Range Status  . Rest HR 09/29/2016 67  bpm Final  . Rest BP 09/29/2016 128/77  mmHg Final  . Peak HR 09/29/2016 85  bpm Final  . Peak BP 09/29/2016 140/67  mmHg Final  . SSS 09/29/2016 7   Final  . SRS 09/29/2016 2   Final  . SDS 09/29/2016 5   Final  . TID 09/29/2016 0.89   Final  . LV sys vol 09/29/2016 59  mL Final  . LV dias vol 09/29/2016 122  46 - 106 mL Final     Imaging: Mr Shoulder Right Wo Contrast  Result Date: 01/29/2017 CLINICAL DATA:  Right shoulder pain for greater than 1 year. EXAM: MRI OF THE RIGHT SHOULDER WITHOUT CONTRAST TECHNIQUE: Multiplanar, multisequence MR imaging of the shoulder was performed. No intravenous contrast was administered. COMPARISON:  Report from 02/22/2013 FINDINGS: Rotator cuff: Full-thickness full width supraspinatus rupture retracted 4.1 cm back to the plane of the glenoid. Full-thickness full width infraspinatus tear retracted 4.1 cm back to the plane of the glenoid. Mild articular surface partial thickness tearing of the subscapularis tendon. Mild teres minor tendinopathy. Muscles: Considerable abnormal edema tracks in the infraspinatus muscle. Mild infraspinatus and supraspinatus atrophy. Subtle edema tracks along the inferior margin of  the remaining supraspinatus muscle. Biceps long head: Ill definition of the intra-articular segment, with suspicion for longitudinal tearing. Acromioclavicular Joint: Mild spurring. Subacromial morphology is type 2 (curved). The humeral head is subluxed superiorly against the acromial undersurface with chronic accommodation of the acromial undersurface indicating that the supraspinatus rupture is likely chronic. The supraspinatus tendon is striated tendinopathy and potentially mild partial tearing on 02/22/2013. There is free communication of the glenohumeral joint effusion with the subacromial subdeltoid bursa and subcoracoid bursa. Glenohumeral Joint: The humeral head is subluxed superiorly against the acromion, with associated pseudoarticulation. Mild spurring of the humeral head and glenoid. There is only mild degenerative chondral thinning. Glenohumeral joint effusion communicating with the subacromial subdeltoid bursa. Labrum:  Grossly unremarkable Bones: No significant extra-articular osseous abnormalities identified. Other: No supplemental non-categorized findings. IMPRESSION: 1. Full-thickness full  width chronic rupture of the supraspinatus tendon retracted back 4.1 cm to the plane of the glenoid. Mild supraspinatus muscular atrophy. 2. Full-thickness full width rupture of the infraspinatus tendon back to the plane of the glenoid, with considerable abnormal edema signal throughout the infraspinatus muscle. There is also mild infraspinatus muscular atrophy. 3. The humeral head is subluxed superiorly against the acromial undersurface which demonstrates accommodation indicating chronic rotator cuff tear. 4. Mild degenerative glenohumeral arthropathy with spurring and mild chondral thinning. Mild spurring of the [facility] joint. 5. Glenohumeral joint effusion communicates with the subacromial subdeltoid bursa and the subcoracoid bursa. 6. There is at least longitudinal split tearing of the intra-articular segment of  long head of biceps. Electronically Signed   By: Van Clines M.D.   On: 01/29/2017 17:57    Speciality Comments: No specialty comments available.    Procedures:  No procedures performed Allergies: Statins   Assessment / Plan:     Visit Diagnoses: Rheumatoid arthritis of multiple sites with negative rheumatoid factor (HCC)  High risk medication use  History of diabetes mellitus  History of hypertension  Morbid obesity (HCC)   Plan: #1: Rheumatoid arthritis with rheumatoid factor negative. Patient is doing well with RA. No complaint.  #2: High risk prescription. Methotrexate 0.8 ML's every week Folic acid 2 mg every day Folic acid 20 mg twice a day Monday through Friday. Plaquenil eye exam is due now and patient is seeing her eye doctor today. She will get them to do the Plaquenil eye exam.  #3: Right shoulder joint pain. Seeing Dr. Durward Fortes for upcoming surgery soon.  #4: Patient's labs are up-to-date as of June 2018. She'll need repeat labs in 3 months.(september 2018 --> will come here to get blood drawn.)  #5: History of diabetes and hypertension  Orders: No orders of the defined types were placed in this encounter.  Meds ordered this encounter  Medications  . methotrexate 50 MG/2ML injection    Sig: INJECT 0.8 ML SUB-Q EVERY WEEK    Dispense:  10 mL    Refill:  0    Dispense with preservatives only; 2 ML vials okay as long as patient gets  90 day supply.    Order Specific Question:   Supervising Provider    Answer:   Lyda Perone  . folic acid (FOLVITE) 1 MG tablet    Sig: Take 2 tablets (2 mg total) by mouth daily.    Dispense:  180 tablet    Refill:  4    Order Specific Question:   Supervising Provider    Answer:   Bo Merino [phone]    Face-to-face time spent with patient was 30 minutes. 50% of time was spent in counseling and coordination of care.  Follow-Up Instructions: Return in about 5 months (around  07/16/2017).   Eliezer Lofts, PA-C  Note - This record has been created using [employer].  Chart creation errors have been sought, but may not always  have been located. Such creation errors do not reflect on  the standard of medical care.

## 2017-02-14 ENCOUNTER — Telehealth: Payer: Self-pay | Admitting: Cardiology

## 2017-02-14 ENCOUNTER — Ambulatory Visit (INDEPENDENT_AMBULATORY_CARE_PROVIDER_SITE_OTHER): Payer: PPO | Admitting: Orthopaedic Surgery

## 2017-02-14 ENCOUNTER — Ambulatory Visit (INDEPENDENT_AMBULATORY_CARE_PROVIDER_SITE_OTHER): Payer: PPO

## 2017-02-14 DIAGNOSIS — M25552 Pain in left hip: Secondary | ICD-10-CM

## 2017-02-14 MED ORDER — TIZANIDINE HCL 2 MG PO TABS: 2.0000 mg | ORAL_TABLET | Freq: Three times a day (TID) | ORAL | 0 refills | Status: DC | PRN

## 2017-02-14 MED ORDER — PREDNISONE 10 MG (21) PO TBPK: ORAL_TABLET | ORAL | 0 refills | Status: DC

## 2017-02-14 NOTE — Progress Notes (Signed)
Office Visit Note   Patient: [name]           Date of Birth: 1946/04/21           MRN: [id number] Visit Date: 02/14/2017              Requested by: Crist Infante, MD [address] [place],  [id number] PCP: Crist Infante, MD   Assessment & Plan: Visit Diagnoses:  1. Pain of left hip joint     Plan: Overall impression is low back pain and lumbar radiculopathy. Prednisone taper was prescribed along with tizanidine. If not better should follow up with Dr. Louanne Skye.  Follow-Up Instructions: Return if symptoms worsen or fail to improve.   Orders:  Orders Placed This Encounter  Procedures  . XR HIP UNILAT W OR W/O PELVIS 2-3 VIEWS LEFT   Meds ordered this encounter  Medications  . predniSONE (STERAPRED UNI-PAK 21 TAB) 10 MG (21) TBPK tablet    Sig: Take as directed    Dispense:  21 tablet    Refill:  0  . tiZANidine (ZANAFLEX) 2 MG tablet    Sig: Take 1-2 tablets (2-4 mg total) by mouth every 8 (eight) hours as needed for muscle spasms.    Dispense:  30 tablet    Refill:  0      Procedures: No procedures performed   Clinical Data: No additional findings.   Subjective: No chief complaint on file.   Patient is a 71 year old female who is a year and a half status post decompressive lumbar surgery by Dr. Louanne Skye who comes in with left hip pain and low back pain with radiation down to the knee. This is been constant for a few days. She takes baclofen and Tylenol without significant relief. She is currently on methotrexate and Plaquenil for her rheumatoid arthritis.    Review of Systems  Constitutional: Negative.   HENT: Negative.   Eyes: Negative.   Respiratory: Negative.   Cardiovascular: Negative.   Endocrine: Negative.   Musculoskeletal: Negative.   Neurological: Negative.   Hematological: Negative.   Psychiatric/Behavioral: Negative.   All other systems reviewed and are negative.    Objective: Vital Signs: There were no vitals taken for this  visit.  Physical Exam  Constitutional: She is oriented to person, place, and time. She appears well-developed and well-nourished.  Pulmonary/Chest: Effort normal.  Neurological: She is alert and oriented to person, place, and time.  Skin: Skin is warm. Capillary refill takes less than 2 seconds.  Psychiatric: She has a normal mood and affect. Her behavior is normal. Judgment and thought content normal.  Nursing note and vitals reviewed.   Ortho Exam Left hip exam shows painless range of motion of the hip. Negative Stinchfield sign. No sciatic tension signs. Specialty Comments:  No specialty comments available.  Imaging: Xr Hip Unilat W Or W/o Pelvis 2-3 Views Left  Result Date: 02/14/2017 Symmetric bilateral joint space narrowing    PMFS History: Patient Active Problem List   Diagnosis Date Noted  . Open wound of left lower leg 12/26/2016  . Vitamin D deficiency 09/14/2016  . History of total knee replacement, bilateral 09/14/2016  . DJD (degenerative joint disease), cervical 09/14/2016  . 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 High risk medication use 05/20/2016  . Spinal stenosis, lumbar region, with neurogenic claudication 09/08/2015    Class: Chronic  . OSA (obstructive sleep apnea) 10/17/2013  . Dyspnea 09/04/2013  . Atypical chest pain 09/04/2013  .  Morbid obesity ([place]) 03/21/2012  . Cancer of lower-outer quadrant of female breast ([place]) 03/15/2012  . Diabetes mellitus type II 02/22/2011  . Coronary artery disease   . Hypertension   . Hyperlipidemia   . OA (osteoarthritis) of knee    Past Medical History:  Diagnosis Date  . Asthma    Albuterol inhaler as needed.Pulmicort neb as needed  . Asthma   . Breast cancer ([place])   . Chronic back pain    spinal stenosis  . Coronary artery disease   . Depression    takes CYmbalta and Wellbutrin daily  . Diabetes mellitus    not on any meds/controlled by diet  . Dyspnea    with  exertion occasionally when lies down  . GERD (gastroesophageal reflux disease)    takes Omeprazole daily  . Headache    oocasionally  . History of kidney stones   . Hyperlipidemia    not on any meds  . Hypertension    takes Benazepril,Bystolic,and Amlodipine daily  . IBS (irritable bowel syndrome)   . Joint pain   . Joint swelling   . Nocturia   . OA (osteoarthritis) of knee   . OSA (obstructive sleep apnea)   . Peripheral edema    takes daily as needed  . Peripheral neuropathy   . Pneumonia    hx of-several yrs ago  . RA (rheumatoid arthritis) ([place])   . Urinary frequency   . Urinary urgency   . Weakness    numbness and tingling mainly in left leg occasionally in right.Tingling/numbness in hands    Family History  Problem Relation Age of Onset  . Heart disease Mother   . Cancer Father   . Cancer Brother   . Heart disease Brother     Past Surgical History:  Procedure Laterality Date  . BREAST LUMPECTOMY     RIGHT BREAST  . CARDIAC CATHETERIZATION  04/23/2004   EF 60%  . cataract surgery Bilateral   . COLONOSCOPY    . CORONARY ARTERY BYPASS GRAFT  2005   LIMA GRAFT TO LAD, SAPHENOUS VEIN GRAFT TO THE FIRST DIAGONAL, AND LEFT RADIAL ARTERY GRAFT TO THE OM  . FOOT SURGERY Right   . JOINT REPLACEMENT    . LITHOTRIPSY    . LUMBAR LAMINECTOMY/DECOMPRESSION MICRODISCECTOMY N/A 09/08/2015   Procedure: CENTRAL DECOMPRESSIVE LUMBAR LAMINECTOMIES L3-4, L4-5 AND BILATERAL HEMILAMINECTOMY L5-S1;  Surgeon: Jessy Oto, MD;  Location: [facility];  Service: Orthopedics;  Laterality: N/A;  . nodule removed from left elbow    . TOTAL KNEE ARTHROPLASTY Bilateral   . TRIPLE BYPASS  04/27/05  . US ECHOCARDIOGRAPHY  01/06/2009   EF 55-60%  . WRIST SURGERY Left    Social History   Occupational History  . retired Unemployed   Social History Main Topics  . Smoking status: Never Smoker  . Smokeless tobacco: Never Used  . Alcohol use No  . Drug use: No  . Sexual activity: Not on file

## 2017-02-14 NOTE — Telephone Encounter (Signed)
Called Dr. Morrison Old office and they had been faxing the form to the incorrect number. Will await form to be faxed today.

## 2017-02-14 NOTE — Telephone Encounter (Signed)
I see nothing in the system.  Could you please call there office.  Apparently has sent twice

## 2017-02-14 NOTE — Telephone Encounter (Signed)
New message    [name] is calling from Dr. Wonda Horner office. She said that the clearance has been faxed to the wrong number. She states she saw in epic. It should be faxed to [phone]

## 2017-02-16 DIAGNOSIS — Z79899 Other long term (current) drug therapy: Secondary | ICD-10-CM | POA: Diagnosis not present

## 2017-02-16 DIAGNOSIS — H524 Presbyopia: Secondary | ICD-10-CM | POA: Diagnosis not present

## 2017-02-16 DIAGNOSIS — H04123 Dry eye syndrome of bilateral lacrimal glands: Secondary | ICD-10-CM | POA: Diagnosis not present

## 2017-02-16 DIAGNOSIS — E119 Type 2 diabetes mellitus without complications: Secondary | ICD-10-CM | POA: Diagnosis not present

## 2017-02-16 NOTE — Telephone Encounter (Signed)
Received surgical clearance from Dr.Whitfield's office.Dr.Jordan cleared patient for upcoming surgery.Form faxed to fax # [phone].

## 2017-02-20 ENCOUNTER — Encounter (INDEPENDENT_AMBULATORY_CARE_PROVIDER_SITE_OTHER): Payer: Self-pay | Admitting: Orthopaedic Surgery

## 2017-02-21 DIAGNOSIS — N6452 Nipple discharge: Secondary | ICD-10-CM | POA: Diagnosis not present

## 2017-02-21 DIAGNOSIS — I10 Essential (primary) hypertension: Secondary | ICD-10-CM | POA: Diagnosis not present

## 2017-02-21 DIAGNOSIS — E663 Overweight: Secondary | ICD-10-CM | POA: Diagnosis not present

## 2017-02-21 DIAGNOSIS — Z853 Personal history of malignant neoplasm of breast: Secondary | ICD-10-CM | POA: Diagnosis not present

## 2017-02-21 DIAGNOSIS — Z951 Presence of aortocoronary bypass graft: Secondary | ICD-10-CM | POA: Diagnosis not present

## 2017-02-21 DIAGNOSIS — E669 Obesity, unspecified: Secondary | ICD-10-CM | POA: Diagnosis not present

## 2017-02-21 DIAGNOSIS — M069 Rheumatoid arthritis, unspecified: Secondary | ICD-10-CM | POA: Diagnosis not present

## 2017-02-23 ENCOUNTER — Other Ambulatory Visit (INDEPENDENT_AMBULATORY_CARE_PROVIDER_SITE_OTHER): Payer: Self-pay | Admitting: Orthopaedic Surgery

## 2017-02-24 ENCOUNTER — Other Ambulatory Visit: Payer: Self-pay | Admitting: General Surgery

## 2017-02-24 DIAGNOSIS — N6452 Nipple discharge: Secondary | ICD-10-CM

## 2017-02-28 ENCOUNTER — Ambulatory Visit: Payer: PPO | Admitting: Rheumatology

## 2017-03-01 ENCOUNTER — Ambulatory Visit
Admission: RE | Admit: 2017-03-01 | Discharge: 2017-03-01 | Disposition: A | Discharge status: Home / Self Care | Payer: PPO | Source: Ambulatory Visit | Attending: General Surgery | Admitting: General Surgery

## 2017-03-01 ENCOUNTER — Telehealth (INDEPENDENT_AMBULATORY_CARE_PROVIDER_SITE_OTHER): Payer: Self-pay | Admitting: Radiology

## 2017-03-01 DIAGNOSIS — R928 Other abnormal and inconclusive findings on diagnostic imaging of breast: Secondary | ICD-10-CM | POA: Diagnosis not present

## 2017-03-01 DIAGNOSIS — N6452 Nipple discharge: Secondary | ICD-10-CM

## 2017-03-01 DIAGNOSIS — N6489 Other specified disorders of breast: Secondary | ICD-10-CM | POA: Diagnosis not present

## 2017-03-01 NOTE — Telephone Encounter (Signed)
Patient already has an appt tomorrow

## 2017-03-01 NOTE — Telephone Encounter (Signed)
Daughter called and LMVM for me saying that the patient has called and left messages with [facility] area.  She has a scheduling conflict on 8/11 and wants to see if she can come in sooner?  Pls call Lattie Haw thanks

## 2017-03-01 NOTE — Telephone Encounter (Signed)
Can you call patient and work her in tomorrow either morning or afternoon?

## 2017-03-02 ENCOUNTER — Ambulatory Visit (INDEPENDENT_AMBULATORY_CARE_PROVIDER_SITE_OTHER): Payer: PPO | Admitting: Specialist

## 2017-03-02 ENCOUNTER — Encounter (INDEPENDENT_AMBULATORY_CARE_PROVIDER_SITE_OTHER): Payer: Self-pay | Admitting: Specialist

## 2017-03-02 ENCOUNTER — Ambulatory Visit (INDEPENDENT_AMBULATORY_CARE_PROVIDER_SITE_OTHER): Payer: PPO

## 2017-03-02 DIAGNOSIS — M4726 Other spondylosis with radiculopathy, lumbar region: Secondary | ICD-10-CM

## 2017-03-02 DIAGNOSIS — M7062 Trochanteric bursitis, left hip: Secondary | ICD-10-CM | POA: Diagnosis not present

## 2017-03-02 MED ORDER — METHYLPREDNISOLONE ACETATE 40 MG/ML IJ SUSP
80.0000 mg | INTRAMUSCULAR | Status: AC | PRN
  Administered 2017-03-02: 80 mg

## 2017-03-02 MED ORDER — BUPIVACAINE HCL 0.25 % IJ SOLN
6.0000 mL | INTRAMUSCULAR | Status: AC | PRN
  Administered 2017-03-02: 6 mL via INTRA_ARTICULAR

## 2017-03-02 NOTE — Progress Notes (Signed)
Office Visit Note   Patient: [name]           Date of Birth: 1946-06-18           MRN: [id number] Visit Date: 03/02/2017              Requested by: Crist Infante, MD [address] [place], [place] [address] PCP: Crist Infante, MD   Assessment & Plan: Visit Diagnoses:  1. Other spondylosis with radiculopathy, lumbar region   2. Trochanteric bursitis, left hip     Plan: To give some relief of patient's pain offered injection for greater trochanteric bursitis. Patient states that lateral hip was prepped and injection was performed. Tolerated well without complication.Patient did have some relief of her lateral hip pain only after sitting for several minutes. I do not expect her to have any relief of the left-sided lumbar pain, left buttock pain and left thigh pain. I will schedule lumbar spine MRI with contrast to rule out HNP/stenosis. Follow-up the office after completion to discuss results and further treatment options. We'll likely discuss lumbar ESI's depending on what the scan shows.   Follow-Up Instructions: Return in about 3 weeks (around 03/23/2017).   Orders:  Orders Placed This Encounter  Procedures  . Large Joint Injection/Arthrocentesis  . XR Lumbar Spine 2-3 Views  . MR LUMBAR SPINE W CONTRAST   No orders of the defined types were placed in this encounter.     Procedures: Large Joint Inj Date/Time: 03/02/2017 4:20 PM Performed by: Lanae Crumbly Authorized by: Lanae Crumbly   Consent Given by:  Patient Indications:  Pain Location:  Hip Site:  L greater trochanter Prep: patient was prepped and draped in usual sterile fashion   Needle Size:  22 G Needle Length:  3.5 inches Approach:  Lateral Ultrasound Guidance: No   Fluoroscopic Guidance: No   Arthrogram: No   Medications:  6 mL bupivacaine 0.25 %; 80 mg methylPREDNISolone acetate 40 MG/ML Aspiration Attempted: No   Patient tolerance:  Patient tolerated the procedure well with no immediate  complications      Clinical Data: No additional findings.   Subjective: Chief Complaint  Patient presents with  . Lower Back - Pain    HPI Patient comes in today with complaints of low back pain and left greater than right hip/thigh pain. Patient is status post L3-4 L4-5 and L5-S1 decompression by Dr. Louanne Skye February  2017.  States that she's been having increased pain in these areas the last couple of months. No injury. She was recently seen by Dr. Erlinda Hong it was prescribed prednisone taper and muscle relaxer. States that this gave minimal improvement temporarily. States that pain in the lateral left hip is worse when she is getting up to stand out of a chair, ambulating and also when she lays on her left side at night. She is hoping to be able to get an injection today to help her symptoms. Review of Systems No current cardiac pulmonary GI GU issues.  Objective: Vital Signs: There were no vitals taken for this visit.  Physical Exam  Constitutional: She is oriented to person, place, and time. No distress.  HENT:  Head: Normocephalic and atraumatic.  Eyes: Pupils are equal, round, and reactive to light. EOM are normal.  Neck: Normal range of motion.  Pulmonary/Chest: No respiratory distress.  Musculoskeletal:  Patient has a Trendelenburg gait. Mild lumbar paraspinal tenderness. She is moderate to markedly tender over the left hip greater trochanter. Negative logroll. Negative  straight leg raise. Neurovascular intact. No focal motor deficits.  Neurological: She is alert and oriented to person, place, and time.  Skin: Skin is warm and dry.  Psychiatric: She has a normal mood and affect.    Ortho Exam  Specialty Comments:  No specialty comments available.  Imaging: No results found.   PMFS History: Patient Active Problem List   Diagnosis Date Noted  . Open wound of left lower leg 12/26/2016  . Vitamin D deficiency 09/14/2016  . History of total knee replacement, bilateral  09/14/2016  . DJD (degenerative joint disease), cervical 09/14/2016  . 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 High risk medication use 05/20/2016  . Spinal stenosis, lumbar region, with neurogenic claudication 09/08/2015    Class: Chronic  . OSA (obstructive sleep apnea) 10/17/2013  . Dyspnea 09/04/2013  . Atypical chest pain 09/04/2013  . Morbid obesity ([place]) 03/21/2012  . Cancer of lower-outer quadrant of female breast ([place]) 03/15/2012  . Diabetes mellitus type II 02/22/2011  . Coronary artery disease   . Hypertension   . Hyperlipidemia   . OA (osteoarthritis) of knee    Past Medical History:  Diagnosis Date  . Asthma    Albuterol inhaler as needed.Pulmicort neb as needed  . Asthma   . Breast cancer ([place])   . Chronic back pain    spinal stenosis  . Coronary artery disease   . Depression    takes CYmbalta and Wellbutrin daily  . Diabetes mellitus    not on any meds/controlled by diet  . Dyspnea    with exertion occasionally when lies down  . GERD (gastroesophageal reflux disease)    takes Omeprazole daily  . Headache    oocasionally  . History of kidney stones   . Hyperlipidemia    not on any meds  . Hypertension    takes Benazepril,Bystolic,and Amlodipine daily  . IBS (irritable bowel syndrome)   . Joint pain   . Joint swelling   . Nocturia   . OA (osteoarthritis) of knee   . OSA (obstructive sleep apnea)   . Peripheral edema    takes daily as needed  . Peripheral neuropathy   . Pneumonia    hx of-several yrs ago  . RA (rheumatoid arthritis) ([place])   . Urinary frequency   . Urinary urgency   . Weakness    numbness and tingling mainly in left leg occasionally in right.Tingling/numbness in hands    Family History  Problem Relation Age of Onset  . Heart disease Mother   . Cancer Father   . Cancer Brother   . Heart disease Brother     Past Surgical History:  Procedure Laterality Date  . BREAST LUMPECTOMY  1997    RIGHT BREAST  . CARDIAC CATHETERIZATION  04/23/2004   EF 60%  . cataract surgery Bilateral   . COLONOSCOPY    . CORONARY ARTERY BYPASS GRAFT  2005   LIMA GRAFT TO LAD, SAPHENOUS VEIN GRAFT TO THE FIRST DIAGONAL, AND LEFT RADIAL ARTERY GRAFT TO THE OM  . FOOT SURGERY Right   . JOINT REPLACEMENT    . LITHOTRIPSY    . LUMBAR LAMINECTOMY/DECOMPRESSION MICRODISCECTOMY N/A 09/08/2015   Procedure: CENTRAL DECOMPRESSIVE LUMBAR LAMINECTOMIES L3-4, L4-5 AND BILATERAL HEMILAMINECTOMY L5-S1;  Surgeon: Jessy Oto, MD;  Location: [facility];  Service: Orthopedics;  Laterality: N/A;  . nodule removed from left elbow    . TOTAL KNEE ARTHROPLASTY Bilateral   . TRIPLE BYPASS  04/27/05  .  US ECHOCARDIOGRAPHY  01/06/2009   EF 55-60%  . WRIST SURGERY Left    Social History   Occupational History  . retired Unemployed   Social History Main Topics  . Smoking status: Never Smoker  . Smokeless tobacco: Never Used  . Alcohol use No  . Drug use: No  . Sexual activity: Not on file

## 2017-03-07 ENCOUNTER — Ambulatory Visit (INDEPENDENT_AMBULATORY_CARE_PROVIDER_SITE_OTHER): Payer: PPO | Admitting: Physical Medicine and Rehabilitation

## 2017-03-07 ENCOUNTER — Other Ambulatory Visit (INDEPENDENT_AMBULATORY_CARE_PROVIDER_SITE_OTHER): Payer: Self-pay | Admitting: Surgery

## 2017-03-09 ENCOUNTER — Ambulatory Visit
Admission: RE | Admit: 2017-03-09 | Discharge: 2017-03-09 | Disposition: A | Discharge status: Home / Self Care | Payer: PPO | Source: Ambulatory Visit | Attending: Surgery | Admitting: Surgery

## 2017-03-09 DIAGNOSIS — M48061 Spinal stenosis, lumbar region without neurogenic claudication: Secondary | ICD-10-CM | POA: Diagnosis not present

## 2017-03-09 DIAGNOSIS — M5126 Other intervertebral disc displacement, lumbar region: Secondary | ICD-10-CM | POA: Diagnosis not present

## 2017-03-09 DIAGNOSIS — M4726 Other spondylosis with radiculopathy, lumbar region: Secondary | ICD-10-CM

## 2017-03-09 MED ORDER — GADOBENATE DIMEGLUMINE 529 MG/ML IV SOLN
20.0000 mL | Freq: Once | INTRAVENOUS | Status: AC | PRN
  Administered 2017-03-09: 20 mL via INTRAVENOUS

## 2017-03-16 ENCOUNTER — Encounter: Payer: Self-pay | Admitting: Orthopaedic Surgery

## 2017-03-16 DIAGNOSIS — M94211 Chondromalacia, right shoulder: Secondary | ICD-10-CM | POA: Diagnosis not present

## 2017-03-16 DIAGNOSIS — G8918 Other acute postprocedural pain: Secondary | ICD-10-CM | POA: Diagnosis not present

## 2017-03-16 DIAGNOSIS — M7521 Bicipital tendinitis, right shoulder: Secondary | ICD-10-CM | POA: Diagnosis not present

## 2017-03-16 DIAGNOSIS — M7541 Impingement syndrome of right shoulder: Secondary | ICD-10-CM | POA: Diagnosis not present

## 2017-03-16 DIAGNOSIS — M659 Synovitis and tenosynovitis, unspecified: Secondary | ICD-10-CM | POA: Diagnosis not present

## 2017-03-16 DIAGNOSIS — M75121 Complete rotator cuff tear or rupture of right shoulder, not specified as traumatic: Secondary | ICD-10-CM | POA: Diagnosis not present

## 2017-03-16 DIAGNOSIS — M19011 Primary osteoarthritis, right shoulder: Secondary | ICD-10-CM | POA: Diagnosis not present

## 2017-03-20 ENCOUNTER — Encounter (INDEPENDENT_AMBULATORY_CARE_PROVIDER_SITE_OTHER): Payer: Self-pay | Admitting: Orthopaedic Surgery

## 2017-03-20 ENCOUNTER — Ambulatory Visit (INDEPENDENT_AMBULATORY_CARE_PROVIDER_SITE_OTHER): Payer: PPO | Admitting: Orthopaedic Surgery

## 2017-03-20 VITALS — BP 152/66 | HR 73 | Resp 14 | Ht 64.0 in | Wt 256.0 lb

## 2017-03-20 DIAGNOSIS — M25511 Pain in right shoulder: Secondary | ICD-10-CM

## 2017-03-20 DIAGNOSIS — G8929 Other chronic pain: Secondary | ICD-10-CM

## 2017-03-20 NOTE — Progress Notes (Signed)
Office Visit Note   Patient: [name]           Date of Birth: 12-20-1945           MRN: [id number] Visit Date: 03/20/2017              Requested by: Crist Infante, MD [address] [place], [place] [address] PCP: Crist Infante, MD   Assessment & Plan: Visit Diagnoses:  1. Chronic right shoulder pain   4 days status post arthroscopic distal clavicle resection, subacromial decompression and mini open rotator cuff tear repair. Rotator cuff tear was massive. Unable to completely oppose edge to edge. Still a gap. Plan: Continue sling. Continue pain medicine as needed. Circumduction exercises. Return in 1 week and consider physical therapy  Follow-Up Instructions: Return in about 1 week (around 03/27/2017).   Orders:  No orders of the defined types were placed in this encounter.  No orders of the defined types were placed in this encounter.     Procedures: No procedures performed   Clinical Data: No additional findings.   Subjective: Chief Complaint  Patient presents with  . Right Shoulder - Routine Post Op    [name] is a 71 y o that is 4 days status post Right shoulder arthroscopy.   Doing well with minimal pain now 4 days postop. No numbness or tingling. [place] shortness of breath or chest pain  HPI  Review of Systems  Constitutional: Negative for chills, fatigue and fever.  Eyes: Negative for itching.  Respiratory: Negative for chest tightness and shortness of breath.   Cardiovascular: Negative for chest pain, palpitations and leg swelling.  Gastrointestinal: Negative for blood in stool, constipation and diarrhea.  Musculoskeletal: Negative for back pain, joint swelling, neck pain and neck stiffness.  Neurological: Negative for dizziness, weakness, numbness and headaches.  Hematological: Does not bruise/bleed easily.  Psychiatric/Behavioral: Negative for sleep disturbance. The patient is not nervous/anxious.      Objective: Vital Signs: BP (!) 152/66    Pulse 73   Resp 14   Ht 5\' 4"  (1.626 m)   Wt 256 lb (116.1 kg)   BMI 43.94 kg/m   Physical Exam  Ortho Exam right shoulder wounds healing without evidence of infection. Neurovascular exam intact. No blistering. Not short of breath.  Specialty Comments:  No specialty comments available.  Imaging: No results found.   PMFS History: Patient Active Problem List   Diagnosis Date Noted  . Open wound of left lower leg 12/26/2016  . Vitamin D deficiency 09/14/2016  . History of total knee replacement, bilateral 09/14/2016  . DJD (degenerative joint disease), cervical 09/14/2016  . 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 High risk medication use 05/20/2016  . Spinal stenosis, lumbar region, with neurogenic claudication 09/08/2015    Class: Chronic  . OSA (obstructive sleep apnea) 10/17/2013  . Dyspnea 09/04/2013  . Atypical chest pain 09/04/2013  . Morbid obesity ([place]) 03/21/2012  . Cancer of lower-outer quadrant of female breast ([place]) 03/15/2012  . Diabetes mellitus type II 02/22/2011  . Coronary artery disease   . Hypertension   . Hyperlipidemia   . OA (osteoarthritis) of knee    Past Medical History:  Diagnosis Date  . Asthma    Albuterol inhaler as needed.Pulmicort neb as needed  . Asthma   . Breast cancer ([place])   . Chronic back pain    spinal stenosis  . Coronary artery disease   . Depression  takes CYmbalta and Wellbutrin daily  . Diabetes mellitus    not on any meds/controlled by diet  . Dyspnea    with exertion occasionally when lies down  . GERD (gastroesophageal reflux disease)    takes Omeprazole daily  . Headache    oocasionally  . History of kidney stones   . Hyperlipidemia    not on any meds  . Hypertension    takes Benazepril,Bystolic,and Amlodipine daily  . IBS (irritable bowel syndrome)   . Joint pain   . Joint swelling   . Nocturia   . OA (osteoarthritis) of knee   . OSA (obstructive sleep apnea)     . Peripheral edema    takes daily as needed  . Peripheral neuropathy   . Pneumonia    hx of-several yrs ago  . RA (rheumatoid arthritis) ([place])   . Urinary frequency   . Urinary urgency   . Weakness    numbness and tingling mainly in left leg occasionally in right.Tingling/numbness in hands    Family History  Problem Relation Age of Onset  . Heart disease Mother   . Cancer Father   . Cancer Brother   . Heart disease Brother     Past Surgical History:  Procedure Laterality Date  . BREAST LUMPECTOMY  1997   RIGHT BREAST  . CARDIAC CATHETERIZATION  04/23/2004   EF 60%  . cataract surgery Bilateral   . COLONOSCOPY    . CORONARY ARTERY BYPASS GRAFT  2005   LIMA GRAFT TO LAD, SAPHENOUS VEIN GRAFT TO THE FIRST DIAGONAL, AND LEFT RADIAL ARTERY GRAFT TO THE OM  . FOOT SURGERY Right   . JOINT REPLACEMENT    . LITHOTRIPSY    . LUMBAR LAMINECTOMY/DECOMPRESSION MICRODISCECTOMY N/A 09/08/2015   Procedure: CENTRAL DECOMPRESSIVE LUMBAR LAMINECTOMIES L3-4, L4-5 AND BILATERAL HEMILAMINECTOMY L5-S1;  Surgeon: Jessy Oto, MD;  Location: [facility];  Service: Orthopedics;  Laterality: N/A;  . nodule removed from left elbow    . TOTAL KNEE ARTHROPLASTY Bilateral   . TRIPLE BYPASS  04/27/05  . US ECHOCARDIOGRAPHY  01/06/2009   EF 55-60%  . WRIST SURGERY Left    Social History   Occupational History  . retired Unemployed   Social History Main Topics  . Smoking status: Never Smoker  . Smokeless tobacco: Never Used  . Alcohol use No  . Drug use: No  . Sexual activity: Not on file

## 2017-03-21 ENCOUNTER — Other Ambulatory Visit (INDEPENDENT_AMBULATORY_CARE_PROVIDER_SITE_OTHER): Payer: Self-pay | Admitting: Specialist

## 2017-03-21 ENCOUNTER — Telehealth: Payer: Self-pay | Admitting: Radiology

## 2017-03-21 NOTE — Telephone Encounter (Signed)
Baclofen refill request 

## 2017-03-21 NOTE — Telephone Encounter (Signed)
02/16/2017 Plaquenil eye exam normal, sent for scanning

## 2017-03-22 ENCOUNTER — Encounter: Payer: PPO | Admitting: Gastroenterology

## 2017-03-29 ENCOUNTER — Other Ambulatory Visit (INDEPENDENT_AMBULATORY_CARE_PROVIDER_SITE_OTHER): Payer: Self-pay | Admitting: *Deleted

## 2017-03-29 ENCOUNTER — Ambulatory Visit (INDEPENDENT_AMBULATORY_CARE_PROVIDER_SITE_OTHER): Payer: PPO | Admitting: Orthopedic Surgery

## 2017-03-29 ENCOUNTER — Encounter (INDEPENDENT_AMBULATORY_CARE_PROVIDER_SITE_OTHER): Payer: Self-pay | Admitting: Orthopedic Surgery

## 2017-03-29 VITALS — BP 169/81 | HR 78 | Resp 16 | Ht 64.25 in | Wt 249.0 lb

## 2017-03-29 DIAGNOSIS — Z9889 Other specified postprocedural states: Secondary | ICD-10-CM

## 2017-03-29 MED ORDER — HYDROMORPHONE HCL 2 MG PO TABS: 2.0000 mg | ORAL_TABLET | ORAL | 0 refills | Status: DC | PRN

## 2017-03-29 NOTE — Addendum Note (Signed)
Addended by: Biagio Borg on: 03/29/2017 09:43 AM   Modules accepted: Orders

## 2017-03-29 NOTE — Progress Notes (Addendum)
Office Visit Note   Patient: [name]           Date of Birth: 08-Nov-1945           MRN: [id number] Visit Date: 03/29/2017              Requested by: Crist Infante, MD [address] [place], [place] [address] PCP: Crist Infante, MD   Assessment & Plan: Visit Diagnoses:  1. S/P right rotator cuff repair     Plan:  #1: Continue circumduction exercises #2: PT at [facility] for massive rotator cuff protocol #3: Prescription for Dilaudid was given  Follow-Up Instructions: Return in about 3 weeks (around 04/19/2017).   Orders:  No orders of the defined types were placed in this encounter.  Meds ordered this encounter  Medications  . HYDROmorphone (DILAUDID) 2 MG tablet    Sig: Take 1 tablet (2 mg total) by mouth every 4 (four) hours as needed for severe pain.    Dispense:  30 tablet    Refill:  0    Order Specific Question:   Supervising Provider    Answer:   Garald Balding [[id number]]      Procedures: No procedures performed   Clinical Data: No additional findings.   Subjective: Chief Complaint  Patient presents with  . Right Shoulder - Routine Post Op  . Routine Post Op    Right shoulder, doing good, Tylenol helps    HPI  9 days status post arthroscopic distal clavicle resection, subacromial decompression and mini open rotator cuff tear repair right shoulder. Rotator cuff tear was massive and unable to completely oppose edge to edge with a gap remaining. She Continued her sling. Started on circumduction exercises. Seen for evaluation.  Review of Systems  Constitutional: Negative.   HENT: Negative.   Eyes: Negative.   Respiratory: Negative.   Cardiovascular: Negative.   Endocrine: Negative.   Musculoskeletal: Negative.   Neurological: Negative.   Hematological: Negative.   Psychiatric/Behavioral: Negative.   All other systems reviewed and are negative.    Objective: Vital Signs: BP (!) 169/81 (BP Location: Left Arm, Patient Position: Sitting, Cuff  Size: Normal)   Pulse 78   Resp 16   Ht 5' 4.25" (1.632 m)   Wt 249 lb (112.9 kg)   BMI 42.41 kg/m   Physical Exam  Ortho Exam  Today her wounds are healing per primam. No signs of infection. Passively I can get her to about 45 of abduction and roughly 45 of forward flexion. No external rotation past neutral. Good radial pulse. Neurovascular intact distally.  Specialty Comments:  No specialty comments available.  Imaging: No results found.   PMFS History: Patient Active Problem List   Diagnosis Date Noted  . Open wound of left lower leg 12/26/2016  . Vitamin D deficiency 09/14/2016  . History of total knee replacement, bilateral 09/14/2016  . DJD (degenerative joint disease), cervical 09/14/2016  . 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 High risk medication use 05/20/2016  . Spinal stenosis, lumbar region, with neurogenic claudication 09/08/2015    Class: Chronic  . OSA (obstructive sleep apnea) 10/17/2013  . Dyspnea 09/04/2013  . Atypical chest pain 09/04/2013  . Morbid obesity ([place]) 03/21/2012  . Cancer of lower-outer quadrant of female breast ([place]) 03/15/2012  . Diabetes mellitus type II 02/22/2011  . Coronary artery disease   . Hypertension   . Hyperlipidemia   . OA (osteoarthritis) of knee    Past Medical  History:  Diagnosis Date  . Asthma    Albuterol inhaler as needed.Pulmicort neb as needed  . Asthma   . Breast cancer ([place])   . Chronic back pain    spinal stenosis  . Coronary artery disease   . Depression    takes CYmbalta and Wellbutrin daily  . Diabetes mellitus    not on any meds/controlled by diet  . Dyspnea    with exertion occasionally when lies down  . GERD (gastroesophageal reflux disease)    takes Omeprazole daily  . Headache    oocasionally  . History of kidney stones   . Hyperlipidemia    not on any meds  . Hypertension    takes Benazepril,Bystolic,and Amlodipine daily  . IBS (irritable  bowel syndrome)   . Joint pain   . Joint swelling   . Nocturia   . OA (osteoarthritis) of knee   . OSA (obstructive sleep apnea)   . Peripheral edema    takes daily as needed  . Peripheral neuropathy   . Pneumonia    hx of-several yrs ago  . RA (rheumatoid arthritis) ([place])   . Urinary frequency   . Urinary urgency   . Weakness    numbness and tingling mainly in left leg occasionally in right.Tingling/numbness in hands    Family History  Problem Relation Age of Onset  . Heart disease Mother   . Cancer Father   . Cancer Brother   . Heart disease Brother     Past Surgical History:  Procedure Laterality Date  . BREAST LUMPECTOMY  1997   RIGHT BREAST  . CARDIAC CATHETERIZATION  04/23/2004   EF 60%  . cataract surgery Bilateral   . COLONOSCOPY    . CORONARY ARTERY BYPASS GRAFT  2005   LIMA GRAFT TO LAD, SAPHENOUS VEIN GRAFT TO THE FIRST DIAGONAL, AND LEFT RADIAL ARTERY GRAFT TO THE OM  . FOOT SURGERY Right   . JOINT REPLACEMENT    . LITHOTRIPSY    . LUMBAR LAMINECTOMY/DECOMPRESSION MICRODISCECTOMY N/A 09/08/2015   Procedure: CENTRAL DECOMPRESSIVE LUMBAR LAMINECTOMIES L3-4, L4-5 AND BILATERAL HEMILAMINECTOMY L5-S1;  Surgeon: Jessy Oto, MD;  Location: [facility];  Service: Orthopedics;  Laterality: N/A;  . nodule removed from left elbow    . SHOULDER ARTHROSCOPY    . TOTAL KNEE ARTHROPLASTY Bilateral   . TRIPLE BYPASS  04/27/05  . US ECHOCARDIOGRAPHY  01/06/2009   EF 55-60%  . WRIST SURGERY Left    Social History   Occupational History  . retired Unemployed   Social History Main Topics  . Smoking status: Never Smoker  . Smokeless tobacco: Never Used  . Alcohol use No  . Drug use: No  . Sexual activity: Not on file

## 2017-04-04 DIAGNOSIS — M25611 Stiffness of right shoulder, not elsewhere classified: Secondary | ICD-10-CM | POA: Diagnosis not present

## 2017-04-04 DIAGNOSIS — M6281 Muscle weakness (generalized): Secondary | ICD-10-CM | POA: Diagnosis not present

## 2017-04-04 DIAGNOSIS — M25511 Pain in right shoulder: Secondary | ICD-10-CM | POA: Diagnosis not present

## 2017-04-10 DIAGNOSIS — M25611 Stiffness of right shoulder, not elsewhere classified: Secondary | ICD-10-CM | POA: Diagnosis not present

## 2017-04-10 DIAGNOSIS — M25511 Pain in right shoulder: Secondary | ICD-10-CM | POA: Diagnosis not present

## 2017-04-10 DIAGNOSIS — M6281 Muscle weakness (generalized): Secondary | ICD-10-CM | POA: Diagnosis not present

## 2017-04-12 DIAGNOSIS — M25611 Stiffness of right shoulder, not elsewhere classified: Secondary | ICD-10-CM | POA: Diagnosis not present

## 2017-04-12 DIAGNOSIS — M6281 Muscle weakness (generalized): Secondary | ICD-10-CM | POA: Diagnosis not present

## 2017-04-12 DIAGNOSIS — M25511 Pain in right shoulder: Secondary | ICD-10-CM | POA: Diagnosis not present

## 2017-04-17 DIAGNOSIS — M6281 Muscle weakness (generalized): Secondary | ICD-10-CM | POA: Diagnosis not present

## 2017-04-17 DIAGNOSIS — M25611 Stiffness of right shoulder, not elsewhere classified: Secondary | ICD-10-CM | POA: Diagnosis not present

## 2017-04-17 DIAGNOSIS — M25511 Pain in right shoulder: Secondary | ICD-10-CM | POA: Diagnosis not present

## 2017-04-19 DIAGNOSIS — M25511 Pain in right shoulder: Secondary | ICD-10-CM | POA: Diagnosis not present

## 2017-04-19 DIAGNOSIS — M6281 Muscle weakness (generalized): Secondary | ICD-10-CM | POA: Diagnosis not present

## 2017-04-19 DIAGNOSIS — M25611 Stiffness of right shoulder, not elsewhere classified: Secondary | ICD-10-CM | POA: Diagnosis not present

## 2017-04-24 ENCOUNTER — Ambulatory Visit (INDEPENDENT_AMBULATORY_CARE_PROVIDER_SITE_OTHER): Payer: PPO | Admitting: Orthopaedic Surgery

## 2017-04-24 ENCOUNTER — Encounter (INDEPENDENT_AMBULATORY_CARE_PROVIDER_SITE_OTHER): Payer: Self-pay | Admitting: Orthopaedic Surgery

## 2017-04-24 ENCOUNTER — Other Ambulatory Visit: Payer: Self-pay | Admitting: *Deleted

## 2017-04-24 VITALS — BP 148/86 | HR 72 | Resp 12 | Ht 63.0 in | Wt 249.0 lb

## 2017-04-24 DIAGNOSIS — G8929 Other chronic pain: Secondary | ICD-10-CM

## 2017-04-24 DIAGNOSIS — Z79899 Other long term (current) drug therapy: Secondary | ICD-10-CM

## 2017-04-24 DIAGNOSIS — M25511 Pain in right shoulder: Secondary | ICD-10-CM

## 2017-04-24 NOTE — Progress Notes (Signed)
Office Visit Note   Patient: [name]           Date of Birth: [birth year]           MRN: [id number] Visit Date: 04/24/2017              Requested by: Crist Infante, MD [address] [place], [place] [address] PCP: Crist Infante, MD   Assessment & Plan: Visit Diagnoses:  1. Chronic right shoulder pain     Plan:  5 weeks status post partial repair of a massive rotator cuff tear. Doing well with little discomfort. Still wearing a sling.continue with physical therapy and slowly wean from sling.[name] is aware that there may be some limitations with her shoulder stre Follow-Up Instructions: Return in about 1 month (around 05/25/2017).   Orders:  No orders of the defined types were placed in this encounter.  No orders of the defined types were placed in this encounter.     Procedures: No procedures performed   Clinical Data: No additional findings.   Subjective: Chief Complaint  Patient presents with  . Right Shoulder - Routine Post Op    Ms. [name] is a 71 y o S/P 6 weeks R RCT. She is wearing a sling.   No history of fever or chills, shortness of breath or chest pain. Wearing sling. Would like to start driving HPI  Review of Systems  Constitutional: Negative for chills, fatigue and fever.  Eyes: Negative for itching.  Respiratory: Negative for chest tightness and shortness of breath.   Cardiovascular: Negative for chest pain, palpitations and leg swelling.  Gastrointestinal: Negative for blood in stool, constipation and diarrhea.  Endocrine: Negative for polyuria.  Genitourinary: Negative for dysuria.  Musculoskeletal: Negative for back pain, joint swelling, neck pain and neck stiffness.  Allergic/Immunologic: Negative for immunocompromised state.  Neurological: Negative for dizziness and numbness.  Hematological: Does not bruise/bleed easily.  Psychiatric/Behavioral: The patient is not nervous/anxious.      Objective: Vital Signs: BP (!) 148/86    Pulse 72   Resp 12   Ht 5\' 3"  (1.6 m)   Wt 249 lb (112.9 kg)   BMI 44.11 kg/m   Physical Exam  Ortho Examincisions right shoulder a few without complication. I'm able to abduct 90 and flex fully overhead passively. Skin intact. Neurovascular exam intact  Specialty Comments:  No specialty comments available.  Imaging: No results found.   PMFS History: Patient Active Problem List   Diagnosis Date Noted  . Open wound of left lower leg 12/26/2016  . Vitamin D deficiency 09/14/2016  . History of total knee replacement, bilateral 09/14/2016  . DJD (degenerative joint disease), cervical 09/14/2016  . 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 High risk medication use 05/20/2016  . Spinal stenosis, lumbar region, with neurogenic claudication 09/08/2015    Class: Chronic  . OSA (obstructive sleep apnea) 10/17/2013  . Dyspnea 09/04/2013  . Atypical chest pain 09/04/2013  . Morbid obesity ([place]) 03/21/2012  . Cancer of lower-outer quadrant of female breast ([place]) 03/15/2012  . Diabetes mellitus type II 02/22/2011  . Coronary artery disease   . Hypertension   . Hyperlipidemia   . OA (osteoarthritis) of knee    Past Medical History:  Diagnosis Date  . Asthma    Albuterol inhaler as needed.Pulmicort neb as needed  . Asthma   . Breast cancer ([place])   . Chronic back pain    spinal stenosis  . Coronary artery  disease   . Depression    takes CYmbalta and Wellbutrin daily  . Diabetes mellitus    not on any meds/controlled by diet  . Dyspnea    with exertion occasionally when lies down  . GERD (gastroesophageal reflux disease)    takes Omeprazole daily  . Headache    oocasionally  . History of kidney stones   . Hyperlipidemia    not on any meds  . Hypertension    takes Benazepril,Bystolic,and Amlodipine daily  . IBS (irritable bowel syndrome)   . Joint pain   . Joint swelling   . Nocturia   . OA (osteoarthritis) of knee   . OSA  (obstructive sleep apnea)   . Peripheral edema    takes daily as needed  . Peripheral neuropathy   . Pneumonia    hx of-several yrs ago  . RA (rheumatoid arthritis) ([place])   . Urinary frequency   . Urinary urgency   . Weakness    numbness and tingling mainly in left leg occasionally in right.Tingling/numbness in hands    Family History  Problem Relation Age of Onset  . Heart disease Mother   . Cancer Father   . Cancer Brother   . Heart disease Brother     Past Surgical History:  Procedure Laterality Date  . BREAST LUMPECTOMY  1997   RIGHT BREAST  . CARDIAC CATHETERIZATION  04/23/2004   EF 60%  . cataract surgery Bilateral   . COLONOSCOPY    . CORONARY ARTERY BYPASS GRAFT  2005   LIMA GRAFT TO LAD, SAPHENOUS VEIN GRAFT TO THE FIRST DIAGONAL, AND LEFT RADIAL ARTERY GRAFT TO THE OM  . FOOT SURGERY Right   . JOINT REPLACEMENT    . LITHOTRIPSY    . LUMBAR LAMINECTOMY/DECOMPRESSION MICRODISCECTOMY N/A 09/08/2015   Procedure: CENTRAL DECOMPRESSIVE LUMBAR LAMINECTOMIES L3-4, L4-5 AND BILATERAL HEMILAMINECTOMY L5-S1;  Surgeon: Jessy Oto, MD;  Location: [facility];  Service: Orthopedics;  Laterality: N/A;  . nodule removed from left elbow    . SHOULDER ARTHROSCOPY    . TOTAL KNEE ARTHROPLASTY Bilateral   . TRIPLE BYPASS  04/27/05  . US ECHOCARDIOGRAPHY  01/06/2009   EF 55-60%  . WRIST SURGERY Left    Social History   Occupational History  . retired Unemployed   Social History Main Topics  . Smoking status: Never Smoker  . Smokeless tobacco: Never Used  . Alcohol use No  . Drug use: No  . Sexual activity: Not on file

## 2017-04-25 LAB — COMPLETE METABOLIC PANEL WITH GFR
AG Ratio: 1.8 (calc) (ref 1.0–2.5)
ALBUMIN MSPROF: 4.4 g/dL (ref 3.6–5.1)
ALKALINE PHOSPHATASE (APISO): 55 U/L (ref 33–130)
ALT: 21 U/L (ref 6–29)
AST: 21 U/L (ref 10–35)
BILIRUBIN TOTAL: 0.4 mg/dL (ref 0.2–1.2)
BUN / CREAT RATIO: 29 (calc) — AB (ref 6–22)
BUN: 16 mg/dL (ref 7–25)
CHLORIDE: 102 mmol/L (ref 98–110)
CO2: 27 mmol/L (ref 20–32)
Calcium: 9 mg/dL (ref 8.6–10.4)
Creat: 0.56 mg/dL — ABNORMAL LOW (ref 0.60–0.93)
GFR, EST AFRICAN AMERICAN: 108 mL/min/{1.73_m2} (ref >=60)
GFR, Est Non African American: 93 mL/min/{1.73_m2} (ref >=60)
GLUCOSE: 132 mg/dL — AB (ref 65–99)
Globulin: 2.4 g/dL (calc) (ref 1.9–3.7)
POTASSIUM: 3.8 mmol/L (ref 3.5–5.3)
SODIUM: 140 mmol/L (ref 135–146)
Total Protein: 6.8 g/dL (ref 6.1–8.1)

## 2017-04-25 LAB — CBC WITH DIFFERENTIAL/PLATELET
BASOS ABS: 23 {cells}/uL (ref 0–200)
BASOS PCT: 0.3 %
EOS ABS: 90 {cells}/uL (ref 15–500)
Eosinophils Relative: 1.2 %
HEMATOCRIT: 38.4 % (ref 35.0–45.0)
HEMOGLOBIN: 13 g/dL (ref 11.7–15.5)
Lymphs Abs: 2400 cells/uL (ref 850–3900)
MCH: 30.2 pg (ref 27.0–33.0)
MCHC: 33.9 g/dL (ref 32.0–36.0)
MCV: 89.3 fL (ref 80.0–100.0)
MPV: 10.5 fL (ref 7.5–12.5)
Monocytes Relative: 10 %
NEUTROS ABS: 4238 {cells}/uL (ref 1500–7800)
Neutrophils Relative %: 56.5 %
Platelets: 194 10*3/uL (ref 140–400)
RBC: 4.3 10*6/uL (ref 3.80–5.10)
RDW: 14.2 % (ref 11.0–15.0)
Total Lymphocyte: 32 %
WBC: 7.5 10*3/uL (ref 3.8–10.8)
WBCMIX: 750 {cells}/uL (ref 200–950)

## 2017-04-25 NOTE — Progress Notes (Signed)
Labs are stable.

## 2017-04-26 DIAGNOSIS — M25511 Pain in right shoulder: Secondary | ICD-10-CM | POA: Diagnosis not present

## 2017-04-26 DIAGNOSIS — M6281 Muscle weakness (generalized): Secondary | ICD-10-CM | POA: Diagnosis not present

## 2017-04-26 DIAGNOSIS — M25611 Stiffness of right shoulder, not elsewhere classified: Secondary | ICD-10-CM | POA: Diagnosis not present

## 2017-04-27 ENCOUNTER — Other Ambulatory Visit (INDEPENDENT_AMBULATORY_CARE_PROVIDER_SITE_OTHER): Payer: Self-pay | Admitting: Rheumatology

## 2017-04-27 NOTE — Telephone Encounter (Signed)
Last Visit: 02/13/17 Next Visit: 07/13/17 Labs: 04/24/17 Stable  Okay to refill per Dr. Estanislado Pandy

## 2017-04-28 DIAGNOSIS — M6281 Muscle weakness (generalized): Secondary | ICD-10-CM | POA: Diagnosis not present

## 2017-04-28 DIAGNOSIS — M25611 Stiffness of right shoulder, not elsewhere classified: Secondary | ICD-10-CM | POA: Diagnosis not present

## 2017-04-28 DIAGNOSIS — M25511 Pain in right shoulder: Secondary | ICD-10-CM | POA: Diagnosis not present

## 2017-05-02 DIAGNOSIS — M6281 Muscle weakness (generalized): Secondary | ICD-10-CM | POA: Diagnosis not present

## 2017-05-02 DIAGNOSIS — M25511 Pain in right shoulder: Secondary | ICD-10-CM | POA: Diagnosis not present

## 2017-05-02 DIAGNOSIS — M25611 Stiffness of right shoulder, not elsewhere classified: Secondary | ICD-10-CM | POA: Diagnosis not present

## 2017-05-04 DIAGNOSIS — M6281 Muscle weakness (generalized): Secondary | ICD-10-CM | POA: Diagnosis not present

## 2017-05-04 DIAGNOSIS — M25611 Stiffness of right shoulder, not elsewhere classified: Secondary | ICD-10-CM | POA: Diagnosis not present

## 2017-05-04 DIAGNOSIS — M25511 Pain in right shoulder: Secondary | ICD-10-CM | POA: Diagnosis not present

## 2017-05-09 DIAGNOSIS — M6281 Muscle weakness (generalized): Secondary | ICD-10-CM | POA: Diagnosis not present

## 2017-05-09 DIAGNOSIS — M25511 Pain in right shoulder: Secondary | ICD-10-CM | POA: Diagnosis not present

## 2017-05-09 DIAGNOSIS — M25611 Stiffness of right shoulder, not elsewhere classified: Secondary | ICD-10-CM | POA: Diagnosis not present

## 2017-05-11 DIAGNOSIS — M25511 Pain in right shoulder: Secondary | ICD-10-CM | POA: Diagnosis not present

## 2017-05-11 DIAGNOSIS — M6281 Muscle weakness (generalized): Secondary | ICD-10-CM | POA: Diagnosis not present

## 2017-05-11 DIAGNOSIS — M25611 Stiffness of right shoulder, not elsewhere classified: Secondary | ICD-10-CM | POA: Diagnosis not present

## 2017-05-16 DIAGNOSIS — M25611 Stiffness of right shoulder, not elsewhere classified: Secondary | ICD-10-CM | POA: Diagnosis not present

## 2017-05-16 DIAGNOSIS — M6281 Muscle weakness (generalized): Secondary | ICD-10-CM | POA: Diagnosis not present

## 2017-05-16 DIAGNOSIS — M25511 Pain in right shoulder: Secondary | ICD-10-CM | POA: Diagnosis not present

## 2017-05-17 ENCOUNTER — Other Ambulatory Visit: Payer: Self-pay | Admitting: Internal Medicine

## 2017-05-17 DIAGNOSIS — Z6841 Body Mass Index (BMI) 40.0 and over, adult: Secondary | ICD-10-CM | POA: Diagnosis not present

## 2017-05-17 DIAGNOSIS — I10 Essential (primary) hypertension: Secondary | ICD-10-CM | POA: Diagnosis not present

## 2017-05-17 DIAGNOSIS — R413 Other amnesia: Secondary | ICD-10-CM | POA: Diagnosis not present

## 2017-05-17 DIAGNOSIS — C50919 Malignant neoplasm of unspecified site of unspecified female breast: Secondary | ICD-10-CM | POA: Diagnosis not present

## 2017-05-17 DIAGNOSIS — M5136 Other intervertebral disc degeneration, lumbar region: Secondary | ICD-10-CM | POA: Diagnosis not present

## 2017-05-17 DIAGNOSIS — E1151 Type 2 diabetes mellitus with diabetic peripheral angiopathy without gangrene: Secondary | ICD-10-CM | POA: Diagnosis not present

## 2017-05-17 DIAGNOSIS — Z23 Encounter for immunization: Secondary | ICD-10-CM | POA: Diagnosis not present

## 2017-05-18 DIAGNOSIS — M6281 Muscle weakness (generalized): Secondary | ICD-10-CM | POA: Diagnosis not present

## 2017-05-18 DIAGNOSIS — M25511 Pain in right shoulder: Secondary | ICD-10-CM | POA: Diagnosis not present

## 2017-05-18 DIAGNOSIS — M25611 Stiffness of right shoulder, not elsewhere classified: Secondary | ICD-10-CM | POA: Diagnosis not present

## 2017-05-23 ENCOUNTER — Ambulatory Visit (INDEPENDENT_AMBULATORY_CARE_PROVIDER_SITE_OTHER): Payer: PPO | Admitting: Orthopaedic Surgery

## 2017-05-23 ENCOUNTER — Encounter (INDEPENDENT_AMBULATORY_CARE_PROVIDER_SITE_OTHER): Payer: Self-pay | Admitting: Orthopaedic Surgery

## 2017-05-23 VITALS — BP 158/80 | HR 70 | Resp 14 | Ht 64.0 in | Wt 235.0 lb

## 2017-05-23 DIAGNOSIS — G8929 Other chronic pain: Secondary | ICD-10-CM

## 2017-05-23 DIAGNOSIS — M25511 Pain in right shoulder: Secondary | ICD-10-CM

## 2017-05-23 MED ORDER — DICLOFENAC SODIUM 1 % TD GEL: 2.0000 g | Freq: Four times a day (QID) | TRANSDERMAL | 3 refills | Status: DC

## 2017-05-23 NOTE — Progress Notes (Signed)
Office Visit Note   Patient: [name]           Date of Birth: [birth year]           MRN: [id number] Visit Date: 05/23/2017              Requested by: Crist Infante, MD [address] [place], [place] [address] PCP: Crist Infante, MD   Assessment & Plan: Visit Diagnoses:  1. Chronic right shoulder pain     Plan: 9-10 weeks status post partial rotator cuff tear repair right shoulder. This was a massive tear with incomplete apposition of the tear. Experienced onset of recurrent pain this past Thursday for no apparent reason and is been a little bit more "uncomfortable". Up to that point she was very happy with her progress with very little pain. We'll hold on her therapy until she feels a little better and plan to check her back in a month. I am concerned about the integrity of the cuff and what may happen over time.  Follow-Up Instructions: Return in about 1 month (around 06/23/2017).   Orders:  No orders of the defined types were placed in this encounter.  No orders of the defined types were placed in this encounter.     Procedures: No procedures performed   Clinical Data: No additional findings.   Subjective: Chief Complaint  Patient presents with  . Right Shoulder - Routine Post Op    Ms. [name] is a 71 y o S/P 10 wk Right shoulder arthroscopy. She relates she was doing well and  last Thursday she "felt a pop" while shopping when a lady hugged her. Pt states the hardest thing for her is fastening her seatbelt.Sleeping with a sling.    HPI  Review of Systems  Constitutional: Negative for chills, fatigue and fever.  Eyes: Negative for itching.  Respiratory: Negative for chest tightness and shortness of breath.   Cardiovascular: Negative for chest pain, palpitations and leg swelling.  Gastrointestinal: Positive for diarrhea. Negative for blood in stool and constipation.  Endocrine: Negative for polyuria.  Genitourinary: Negative for dysuria.  Musculoskeletal:  Positive for back pain. Negative for joint swelling and neck stiffness.  Allergic/Immunologic: Negative for immunocompromised state.  Neurological: Positive for headaches. Negative for dizziness and numbness.  Hematological: Does not bruise/bleed easily.  Psychiatric/Behavioral: The patient is not nervous/anxious.      Objective: Vital Signs: BP (!) 158/80   Pulse 70   Resp 14   Ht 5\' 4"  (1.626 m)   Wt 235 lb (106.6 kg)   BMI 40.34 kg/m   Physical Exam  Ortho Exam awake alert and oriented 3 comfortable sitting. Some crepitation in the long the anterior aspect of her right shoulder with him in the impingement position. Full passive abduction and flexion. Unable to obtain her arm over her head. Skin intact neurovascular exam intact no thickened weakness with internal/external rotation  Specialty Comments:  No specialty comments available.  Imaging: No results found.   PMFS History: Patient Active Problem List   Diagnosis Date Noted  . Open wound of left lower leg 12/26/2016  . Vitamin D deficiency 09/14/2016  . History of total knee replacement, bilateral 09/14/2016  . DJD (degenerative joint disease), cervical 09/14/2016  . 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 High risk medication use 05/20/2016  . Spinal stenosis, lumbar region, with neurogenic claudication 09/08/2015    Class: Chronic  . OSA (obstructive sleep apnea) 10/17/2013  . Dyspnea 09/04/2013  .  Atypical chest pain 09/04/2013  . Morbid obesity ([facility]) 03/21/2012  . Cancer of lower-outer quadrant of female breast ([facility]) 03/15/2012  . Diabetes mellitus type II 02/22/2011  . Coronary artery disease   . Hypertension   . Hyperlipidemia   . OA (osteoarthritis) of knee    Past Medical History:  Diagnosis Date  . Asthma    Albuterol inhaler as needed.Pulmicort neb as needed  . Asthma   . Breast cancer ([facility])   . Chronic back pain    spinal stenosis  . Coronary artery  disease   . Depression    takes CYmbalta and Wellbutrin daily  . Diabetes mellitus    not on any meds/controlled by diet  . Dyspnea    with exertion occasionally when lies down  . GERD (gastroesophageal reflux disease)    takes Omeprazole daily  . Headache    oocasionally  . History of kidney stones   . Hyperlipidemia    not on any meds  . Hypertension    takes Benazepril,Bystolic,and Amlodipine daily  . IBS (irritable bowel syndrome)   . Joint pain   . Joint swelling   . Nocturia   . OA (osteoarthritis) of knee   . OSA (obstructive sleep apnea)   . Peripheral edema    takes daily as needed  . Peripheral neuropathy   . Pneumonia    hx of-several yrs ago  . RA (rheumatoid arthritis) ([facility])   . Urinary frequency   . Urinary urgency   . Weakness    numbness and tingling mainly in left leg occasionally in right.Tingling/numbness in hands    Family History  Problem Relation Age of Onset  . Heart disease Mother   . Cancer Father   . Cancer Brother   . Heart disease Brother     Past Surgical History:  Procedure Laterality Date  . BREAST LUMPECTOMY  1997   RIGHT BREAST  . CARDIAC CATHETERIZATION  04/23/2004   EF 60%  . cataract surgery Bilateral   . COLONOSCOPY    . CORONARY ARTERY BYPASS GRAFT  2005   LIMA GRAFT TO LAD, SAPHENOUS VEIN GRAFT TO THE FIRST DIAGONAL, AND LEFT RADIAL ARTERY GRAFT TO THE OM  . FOOT SURGERY Right   . JOINT REPLACEMENT    . LITHOTRIPSY    . LUMBAR LAMINECTOMY/DECOMPRESSION MICRODISCECTOMY N/A 09/08/2015   Procedure: CENTRAL DECOMPRESSIVE LUMBAR LAMINECTOMIES L3-4, L4-5 AND BILATERAL HEMILAMINECTOMY L5-S1;  Surgeon: Jessy Oto, MD;  Location: [facility];  Service: Orthopedics;  Laterality: N/A;  . nodule removed from left elbow    . SHOULDER ARTHROSCOPY    . TOTAL KNEE ARTHROPLASTY Bilateral   . TRIPLE BYPASS  04/27/05  . US ECHOCARDIOGRAPHY  01/06/2009   EF 55-60%  . WRIST SURGERY Left    Social History   Occupational History  . retired  Unemployed   Social History Main Topics  . Smoking status: Never Smoker  . Smokeless tobacco: Never Used  . Alcohol use No  . Drug use: No  . Sexual activity: Not on file

## 2017-05-23 NOTE — Addendum Note (Signed)
Addended by: Mervyn Skeeters on: 05/23/2017 11:12 AM   Modules accepted: Orders

## 2017-05-24 DIAGNOSIS — M25611 Stiffness of right shoulder, not elsewhere classified: Secondary | ICD-10-CM | POA: Diagnosis not present

## 2017-05-24 DIAGNOSIS — M6281 Muscle weakness (generalized): Secondary | ICD-10-CM | POA: Diagnosis not present

## 2017-05-24 DIAGNOSIS — M25511 Pain in right shoulder: Secondary | ICD-10-CM | POA: Diagnosis not present

## 2017-05-25 ENCOUNTER — Encounter: Payer: PPO | Admitting: Gastroenterology

## 2017-05-26 ENCOUNTER — Ambulatory Visit (INDEPENDENT_AMBULATORY_CARE_PROVIDER_SITE_OTHER): Payer: PPO | Admitting: Orthopaedic Surgery

## 2017-05-27 ENCOUNTER — Ambulatory Visit
Admission: RE | Admit: 2017-05-27 | Discharge: 2017-05-27 | Disposition: A | Discharge status: Home / Self Care | Payer: PPO | Source: Ambulatory Visit | Attending: Internal Medicine | Admitting: Internal Medicine

## 2017-05-27 DIAGNOSIS — R410 Disorientation, unspecified: Secondary | ICD-10-CM | POA: Diagnosis not present

## 2017-05-27 DIAGNOSIS — R413 Other amnesia: Secondary | ICD-10-CM

## 2017-05-31 DIAGNOSIS — M6281 Muscle weakness (generalized): Secondary | ICD-10-CM | POA: Diagnosis not present

## 2017-05-31 DIAGNOSIS — M25511 Pain in right shoulder: Secondary | ICD-10-CM | POA: Diagnosis not present

## 2017-05-31 DIAGNOSIS — M25611 Stiffness of right shoulder, not elsewhere classified: Secondary | ICD-10-CM | POA: Diagnosis not present

## 2017-06-07 DIAGNOSIS — M25611 Stiffness of right shoulder, not elsewhere classified: Secondary | ICD-10-CM | POA: Diagnosis not present

## 2017-06-07 DIAGNOSIS — M25511 Pain in right shoulder: Secondary | ICD-10-CM | POA: Diagnosis not present

## 2017-06-07 DIAGNOSIS — M6281 Muscle weakness (generalized): Secondary | ICD-10-CM | POA: Diagnosis not present

## 2017-06-09 ENCOUNTER — Telehealth (INDEPENDENT_AMBULATORY_CARE_PROVIDER_SITE_OTHER): Payer: Self-pay | Admitting: Radiology

## 2017-06-09 NOTE — Telephone Encounter (Signed)
Please call patient to sched MRI Review--30 min appt

## 2017-06-12 ENCOUNTER — Other Ambulatory Visit (INDEPENDENT_AMBULATORY_CARE_PROVIDER_SITE_OTHER): Payer: Self-pay | Admitting: Radiology

## 2017-06-12 ENCOUNTER — Telehealth (INDEPENDENT_AMBULATORY_CARE_PROVIDER_SITE_OTHER): Payer: Self-pay

## 2017-06-12 ENCOUNTER — Telehealth (INDEPENDENT_AMBULATORY_CARE_PROVIDER_SITE_OTHER): Payer: Self-pay | Admitting: Physical Medicine and Rehabilitation

## 2017-06-12 ENCOUNTER — Telehealth (INDEPENDENT_AMBULATORY_CARE_PROVIDER_SITE_OTHER): Payer: Self-pay | Admitting: Specialist

## 2017-06-12 DIAGNOSIS — M48062 Spinal stenosis, lumbar region with neurogenic claudication: Secondary | ICD-10-CM

## 2017-06-12 NOTE — Telephone Encounter (Signed)
I lvmom for the patient's daughter to call the office back to schedule the MRI Review.  It looks like her mother actually sees Dr. Durward Fortes.

## 2017-06-12 NOTE — Telephone Encounter (Signed)
I think they are trying to get this patient set up with Dr. Louanne Skye for an MRI review- 30 minute appointment. She does not have an injection referral, so I can't schedule her for anything.

## 2017-06-12 NOTE — Telephone Encounter (Signed)
See other message

## 2017-06-12 NOTE — Telephone Encounter (Signed)
I have since spoken with the daughter and I have scheduled patient to see Dr Ernestina Patches next week to hopefully get an injection, contingent upon authorization through [facility].

## 2017-06-12 NOTE — Telephone Encounter (Signed)
Patient's daughter called this morning requesting that you call her back to schedule her mother's appointment for the injection in her back.  CB#[phone].  Thank you.

## 2017-06-12 NOTE — Telephone Encounter (Signed)
Patient's daughter Lattie Haw would like a referral for a Back injection with Dr. Ernestina Patches.  Patient saw Dr. Louanne Skye in August 2018 and had an MRI done, but didn't F/U with Dr. Louanne Skye.  Results were given to patient by another doctor

## 2017-06-12 NOTE — Telephone Encounter (Signed)
Patient's daughter Lattie Haw would like a referral for a Back injection with Dr. Ernestina Patches.  Patient saw Dr. Louanne Skye in August 2018 and had an MRI done, but didn't F/U with Dr. Louanne Skye.  Results were given to patient by another doctor.  Lattie Haw would like a call back at [phone].  Please advise.  Thank You

## 2017-06-14 DIAGNOSIS — M25511 Pain in right shoulder: Secondary | ICD-10-CM | POA: Diagnosis not present

## 2017-06-14 DIAGNOSIS — M25611 Stiffness of right shoulder, not elsewhere classified: Secondary | ICD-10-CM | POA: Diagnosis not present

## 2017-06-14 DIAGNOSIS — M6281 Muscle weakness (generalized): Secondary | ICD-10-CM | POA: Diagnosis not present

## 2017-06-21 ENCOUNTER — Encounter (INDEPENDENT_AMBULATORY_CARE_PROVIDER_SITE_OTHER): Payer: Self-pay | Admitting: Physical Medicine and Rehabilitation

## 2017-06-21 ENCOUNTER — Other Ambulatory Visit: Payer: Self-pay | Admitting: Internal Medicine

## 2017-06-21 ENCOUNTER — Ambulatory Visit (INDEPENDENT_AMBULATORY_CARE_PROVIDER_SITE_OTHER): Payer: PPO | Admitting: Physical Medicine and Rehabilitation

## 2017-06-21 ENCOUNTER — Ambulatory Visit (INDEPENDENT_AMBULATORY_CARE_PROVIDER_SITE_OTHER): Payer: PPO

## 2017-06-21 VITALS — BP 159/86 | HR 72 | Temp 97.6°F

## 2017-06-21 DIAGNOSIS — M48062 Spinal stenosis, lumbar region with neurogenic claudication: Secondary | ICD-10-CM

## 2017-06-21 DIAGNOSIS — M5116 Intervertebral disc disorders with radiculopathy, lumbar region: Secondary | ICD-10-CM | POA: Diagnosis not present

## 2017-06-21 DIAGNOSIS — Z139 Encounter for screening, unspecified: Secondary | ICD-10-CM

## 2017-06-21 DIAGNOSIS — M5416 Radiculopathy, lumbar region: Secondary | ICD-10-CM

## 2017-06-21 MED ORDER — BETAMETHASONE SOD PHOS & ACET 6 (3-3) MG/ML IJ SUSP
12.0000 mg | Freq: Once | INTRAMUSCULAR | Status: AC
  Administered 2017-06-21: 12 mg

## 2017-06-21 MED ORDER — LIDOCAINE HCL (PF) 1 % IJ SOLN
2.0000 mL | Freq: Once | INTRAMUSCULAR | Status: AC
  Administered 2017-06-21: 2 mL

## 2017-06-21 NOTE — Patient Instructions (Signed)

## 2017-06-21 NOTE — Progress Notes (Deleted)
Here to hopefully get an injection today.  Wants you to look at MRI scan for her.  Pain across the low back and into the left hip/troch.  No numbness/tingling. Recent right shoulder surgery by Durward Fortes.  Takes tylenol and baclofen for pain/spasm.  No BT's, and she does have a driver today.

## 2017-06-23 ENCOUNTER — Ambulatory Visit (INDEPENDENT_AMBULATORY_CARE_PROVIDER_SITE_OTHER): Payer: PPO | Admitting: Orthopaedic Surgery

## 2017-06-23 ENCOUNTER — Encounter (INDEPENDENT_AMBULATORY_CARE_PROVIDER_SITE_OTHER): Payer: Self-pay | Admitting: Orthopaedic Surgery

## 2017-06-23 VITALS — BP 176/89 | HR 69 | Resp 14 | Ht 63.0 in | Wt 235.0 lb

## 2017-06-23 DIAGNOSIS — G8929 Other chronic pain: Secondary | ICD-10-CM

## 2017-06-23 DIAGNOSIS — M25511 Pain in right shoulder: Secondary | ICD-10-CM | POA: Diagnosis not present

## 2017-06-23 NOTE — Progress Notes (Signed)
Office Visit Note   Patient: [name]           Date of Birth: 1945-11-23           MRN: [id number] Visit Date: 06/23/2017              Requested by: Crist Infante, MD [address] [place], [place] [address] PCP: Crist Infante, MD   Assessment & Plan: Visit Diagnoses:  1. Chronic right shoulder pain     Plan: Just over 3 months status post right shoulder surgery including an arthroscopic debridement attempted a repair of a massive rotator cuff tear. Not Having any significant pain.   still has some functional limitations particularly in fixing her hair.  To new home exercises check back in 3 months.  Ultimately she might have massive tear I think she will never be able to place her arm over  Orders:  No orders of the defined types were placed in this encounter.  No orders of the defined types were placed in this encounter.     Procedures: No procedures performed   Clinical Data: No additional findings.   Subjective: Chief Complaint  Patient presents with  . Right Shoulder - Routine Post Op    [name] is a 71 y o S/P 3 months R shoulder arthroscopy. She is going to PT 1 x a week. Se still cannot get her R shoulder over her head.She recently had lumbar id njection 11/28. [facility] referred her to [place].    HPI  Review of Systems  Constitutional: Negative for chills, fatigue and fever.  Eyes: Negative for itching.  Respiratory: Negative for chest tightness and shortness of breath.   Cardiovascular: Negative for chest pain, palpitations and leg swelling.  Gastrointestinal: Negative for blood in stool, constipation and diarrhea.  Endocrine: Negative for polyuria.  Genitourinary: Negative for dysuria.  Musculoskeletal: Positive for back pain and neck pain. Negative for joint swelling and neck stiffness.  Allergic/Immunologic: Negative for immunocompromised state.  Neurological: Negative for dizziness and numbness.  Hematological: Does not bruise/bleed easily.    Psychiatric/Behavioral: The patient is not nervous/anxious.      Objective: Vital Signs: BP (!) 176/89   Pulse 69   Resp 14   Ht 5\' 3"  (1.6 m)   Wt 235 lb (106.6 kg)   BMI 41.63 kg/m   Physical Exam  Ortho Exam awake alert and oriented x3.  Trouble sitting.  Good grip and good release.  Hold abduction at about 50-60 degrees.  I could place her arm fully overhead but she could not hold that position No specialty comments available.  Imaging: No results found.   PMFS History: Patient Active Problem List   Diagnosis Date Noted  . Open wound of left lower leg 12/26/2016  . Vitamin D deficiency 09/14/2016  . History of total knee replacement, bilateral 09/14/2016  . DJD (degenerative joint disease), cervical 09/14/2016  . 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 High risk medication use 05/20/2016  . Spinal stenosis, lumbar region, with neurogenic claudication 09/08/2015    Class: Chronic  . OSA (obstructive sleep apnea) 10/17/2013  . Dyspnea 09/04/2013  . Atypical chest pain 09/04/2013  . Morbid obesity ([facility]) 03/21/2012  . Cancer of lower-outer quadrant of female breast ([facility]) 03/15/2012  . Diabetes mellitus type II 02/22/2011  . Coronary artery disease   . Hypertension   . Hyperlipidemia   . OA (osteoarthritis) of knee    Past Medical History:  Diagnosis  Date  . Asthma    Albuterol inhaler as needed.Pulmicort neb as needed  . Asthma   . Breast cancer ([facility])   . Chronic back pain    spinal stenosis  . Coronary artery disease   . Depression    takes CYmbalta and Wellbutrin daily  . Diabetes mellitus    not on any meds/controlled by diet  . Dyspnea    with exertion occasionally when lies down  . GERD (gastroesophageal reflux disease)    takes Omeprazole daily  . Headache    oocasionally  . History of kidney stones   . Hyperlipidemia    not on any meds  . Hypertension    takes Benazepril,Bystolic,and Amlodipine daily   . IBS (irritable bowel syndrome)   . Joint pain   . Joint swelling   . Nocturia   . OA (osteoarthritis) of knee   . OSA (obstructive sleep apnea)   . Peripheral edema    takes daily as needed  . Peripheral neuropathy   . Pneumonia    hx of-several yrs ago  . RA (rheumatoid arthritis) ([facility])   . Urinary frequency   . Urinary urgency   . Weakness    numbness and tingling mainly in left leg occasionally in right.Tingling/numbness in hands    Family History  Problem Relation Age of Onset  . Heart disease Mother   . Cancer Father   . Cancer Brother   . Heart disease Brother     Past Surgical History:  Procedure Laterality Date  . BREAST LUMPECTOMY  1997   RIGHT BREAST  . CARDIAC CATHETERIZATION  04/23/2004   EF 60%  . cataract surgery Bilateral   . COLONOSCOPY    . CORONARY ARTERY BYPASS GRAFT  2005   LIMA GRAFT TO LAD, SAPHENOUS VEIN GRAFT TO THE FIRST DIAGONAL, AND LEFT RADIAL ARTERY GRAFT TO THE OM  . FOOT SURGERY Right   . JOINT REPLACEMENT    . LITHOTRIPSY    . LUMBAR LAMINECTOMY/DECOMPRESSION MICRODISCECTOMY N/A 09/08/2015   Procedure: CENTRAL DECOMPRESSIVE LUMBAR LAMINECTOMIES L3-4, L4-5 AND BILATERAL HEMILAMINECTOMY L5-S1;  Surgeon: Jessy Oto, MD;  Location: [facility];  Service: Orthopedics;  Laterality: N/A;  . nodule removed from left elbow    . SHOULDER ARTHROSCOPY    . TOTAL KNEE ARTHROPLASTY Bilateral   . TRIPLE BYPASS  04/27/05  . US ECHOCARDIOGRAPHY  01/06/2009   EF 55-60%  . WRIST SURGERY Left    Social History   Occupational History  . Occupation: retired    Fish farm manager: UNEMPLOYED  Tobacco Use  . Smoking status: Never Smoker  . Smokeless tobacco: Never Used  Substance and Sexual Activity  . Alcohol use: No    Alcohol/week: 0.0 oz  . Drug use: No  . Sexual activity: Not on file

## 2017-06-28 ENCOUNTER — Encounter (INDEPENDENT_AMBULATORY_CARE_PROVIDER_SITE_OTHER): Payer: Self-pay | Admitting: Physical Medicine and Rehabilitation

## 2017-06-28 DIAGNOSIS — M6281 Muscle weakness (generalized): Secondary | ICD-10-CM | POA: Diagnosis not present

## 2017-06-28 DIAGNOSIS — M25611 Stiffness of right shoulder, not elsewhere classified: Secondary | ICD-10-CM | POA: Diagnosis not present

## 2017-06-28 DIAGNOSIS — M25511 Pain in right shoulder: Secondary | ICD-10-CM | POA: Diagnosis not present

## 2017-06-28 NOTE — Procedures (Signed)
Lumbosacral Transforaminal Epidural Steroid Injection - Sub-Pedicular Approach with Fluoroscopic Guidance  Patient: [name]      Date of Birth: 1946-01-15 MRN: [id number] PCP: Crist Infante, MD      Visit Date: 06/21/2017   Universal Protocol:    Date/Time: 06/21/2017  Consent Given By: the patient  Position: PRONE  Additional Comments: Vital signs were monitored before and after the procedure. Patient was prepped and draped in the usual sterile fashion. The correct patient, procedure, and site was verified.   Injection Procedure Details:  Procedure Site One Meds Administered:  Meds ordered this encounter  Medications  . lidocaine (PF) (XYLOCAINE) 1 % injection 2 mL  . betamethasone acetate-betamethasone sodium phosphate (CELESTONE) injection 12 mg    Laterality: Left  Location/Site:  L3-L4  Needle size: 22 G  Needle type: Spinal  Needle Placement: Transforaminal  Findings:    -Comments: Excellent flow of contrast along the nerve and into the epidural space.  Procedure Details: After squaring off the end-plates to get a true AP view, the C-arm was positioned so that an oblique view of the foramen as noted above was visualized. The target area is just inferior to the "nose of the scotty dog" or sub pedicular. The soft tissues overlying this structure were infiltrated with 2-3 ml. of 1% Lidocaine without Epinephrine.  The spinal needle was inserted toward the target using a "trajectory" view along the fluoroscope beam.  Under AP and lateral visualization, the needle was advanced so it did not puncture dura and was located close the 6 O'Clock position of the pedical in AP tracterory. Biplanar projections were used to confirm position. Aspiration was confirmed to be negative for CSF and/or blood. A 1-2 ml. volume of Isovue-250 was injected and flow of contrast was noted at each level. Radiographs were obtained for documentation purposes.   After attaining the desired  flow of contrast documented above, a 0.5 to 1.0 ml test dose of 0.25% Marcaine was injected into each respective transforaminal space.  The patient was observed for 90 seconds post injection.  After no sensory deficits were reported, and normal lower extremity motor function was noted,   the above injectate was administered so that equal amounts of the injectate were placed at each foramen (level) into the transforaminal epidural space.   Additional Comments:  The patient tolerated the procedure well Dressing: Band-Aid    Post-procedure details: Patient was observed during the procedure. Post-procedure instructions were reviewed.  Patient left the clinic in stable condition.

## 2017-06-28 NOTE — Progress Notes (Signed)
[name] - 71 y.o. female MRN [id number]  Date of birth: 1945-11-16  Office Visit Note: Visit Date: 06/21/2017 PCP: Crist Infante, MD Referred by: Crist Infante, MD  Subjective: Chief Complaint  Patient presents with  . Lower Back - Pain  . Left Hip - Pain   HPI: [name] is a 71 year old who at least for her back pain is been followed by Dr. Louanne Skye and Benjiman Core.  She has been followed more recently by Dr. Erlinda Hong in the office for her hip pain chronically seen [facility] for her shoulder pain.  He has chronic left shoulder pain is been followed by Dr. Durward Fortes and he has seen her as late as the last month or so.  Her case medically is complicated by rheumatoid arthritis, diabetes, breast cancer, depression, obstructive sleep apnea and hypertension.  I have seen her in the past for injection treatment through Dr. Louanne Skye.  Last seen her in quite some time.  It appears that in August James Owens ordered an MRI of her lumbar spine.  She reports called in with complaints that she had never seen a follow-up for this MRI have low back pain and problems with her hips.  She did not discussed this with Dr. Durward Fortes mainly because he was seeing her for her shoulder I assume.  She comes in today with pain across the lower back in the left hip and greater trochanteric area laterally.  She does not endorse any numbness or tingling.  Again she is status post right shoulder surgery this past year by Dr. Durward Fortes.  She does have worsening pain with standing and ambulating and better symptoms at rest.  She has had medications including Tylenol and baclofen without much relief of her low back pain.  She has not seen her given details of her lumbar spine MRI.  I did review this at length with her and this is reviewed below.  She has not had any specific trauma or falls.  She has had multiple musculoskeletal and orthopedic complaints.  She has not been a recent physical therapy.  She has been to therapy for the  shoulder.  She has not had any recent spinal injections.  The last injection we performed was a sacroiliac joint injection without any relief.  He denies any unintended weight loss or fevers chills or night sweats.    Review of Systems  Constitutional: Positive for malaise/fatigue. Negative for chills, fever and weight loss.  HENT: Negative for hearing loss and sinus pain.   Eyes: Negative for blurred vision, double vision and photophobia.  Respiratory: Negative for cough and shortness of breath.   Cardiovascular: Negative for chest pain, palpitations and leg swelling.  Gastrointestinal: Negative for abdominal pain, nausea and vomiting.  Genitourinary: Negative for flank pain.  Musculoskeletal: Positive for back pain and joint pain. Negative for myalgias.  Skin: Negative for itching and rash.  Neurological: Positive for weakness. Negative for tremors and focal weakness.  Endo/Heme/Allergies: Negative.   Psychiatric/Behavioral: Negative for depression.  All other systems reviewed and are negative.  Otherwise per HPI.  Assessment & Plan: Visit Diagnoses:  1. Lumbar radiculopathy   2. Spinal stenosis of lumbar region with neurogenic claudication   3. Radiculopathy due to lumbar intervertebral disc disorder     Plan: Findings:  Chronic history of low back pain postlaminectomy syndrome with prior lumbar surgery at L4-5 and L5-S1.  She has chronic pain syndrome chronic musculoskeletal orthopedic complaints.  Her situation is worsened by diagnosis of depression,  anxiety and diabetes.  But we went over her MRI that was performed in August.  This shows worsening central disc extrusion with superior migration at L3-4 along with severe facet hypertrophy and arthritis.  There is moderate to severe spinal canal stenosis.  This also shows the prior decompression at L4-5 and L5-S1.  The canal is open but there is fairly severe foraminal narrowing bilaterally at L4-5.  We decided that the appropriate  treatment at this point since she has been having this for quite some time now is a left L3 transforaminal epidural steroid injection.  Follow-up with Dr. Louanne Skye.  Depending on the relief obviously could repeat the injection versus look at discectomy laminectomy.  Obviously that is a decision Dr. Louanne Skye would make.  She will continue on current medications.    Meds & Orders:  Meds ordered this encounter  Medications  . lidocaine (PF) (XYLOCAINE) 1 % injection 2 mL  . betamethasone acetate-betamethasone sodium phosphate (CELESTONE) injection 12 mg    Orders Placed This Encounter  Procedures  . XR C-ARM NO REPORT  . Epidural Steroid injection    Follow-up: Return if symptoms worsen or fail to improve, for Dr. Louanne Skye.   Procedures: No procedures performed  Lumbosacral Transforaminal Epidural Steroid Injection - Sub-Pedicular Approach with Fluoroscopic Guidance  Patient: [name]      Date of Birth: 12-01-1945 MRN: [id number] PCP: Crist Infante, MD      Visit Date: 06/21/2017   Universal Protocol:    Date/Time: 06/21/2017  Consent Given By: the patient  Position: PRONE  Additional Comments: Vital signs were monitored before and after the procedure. Patient was prepped and draped in the usual sterile fashion. The correct patient, procedure, and site was verified.   Injection Procedure Details:  Procedure Site One Meds Administered:  Meds ordered this encounter  Medications  . lidocaine (PF) (XYLOCAINE) 1 % injection 2 mL  . betamethasone acetate-betamethasone sodium phosphate (CELESTONE) injection 12 mg    Laterality: Left  Location/Site:  L3-L4  Needle size: 22 G  Needle type: Spinal  Needle Placement: Transforaminal  Findings:    -Comments: Excellent flow of contrast along the nerve and into the epidural space.  Procedure Details: After squaring off the end-plates to get a true AP view, the C-arm was positioned so that an oblique view of the foramen as  noted above was visualized. The target area is just inferior to the "nose of the scotty dog" or sub pedicular. The soft tissues overlying this structure were infiltrated with 2-3 ml. of 1% Lidocaine without Epinephrine.  The spinal needle was inserted toward the target using a "trajectory" view along the fluoroscope beam.  Under AP and lateral visualization, the needle was advanced so it did not puncture dura and was located close the 6 O'Clock position of the pedical in AP tracterory. Biplanar projections were used to confirm position. Aspiration was confirmed to be negative for CSF and/or blood. A 1-2 ml. volume of Isovue-250 was injected and flow of contrast was noted at each level. Radiographs were obtained for documentation purposes.   After attaining the desired flow of contrast documented above, a 0.5 to 1.0 ml test dose of 0.25% Marcaine was injected into each respective transforaminal space.  The patient was observed for 90 seconds post injection.  After no sensory deficits were reported, and normal lower extremity motor function was noted,   the above injectate was administered so that equal amounts of the injectate were placed at each foramen (level)  into the transforaminal epidural space.   Additional Comments:  The patient tolerated the procedure well Dressing: Band-Aid    Post-procedure details: Patient was observed during the procedure. Post-procedure instructions were reviewed.  Patient left the clinic in stable condition.       Clinical History: MRI LUMBAR SPINE WITHOUT AND WITH CONTRAST  TECHNIQUE: Multiplanar and multiecho pulse sequences of the lumbar spine were obtained without and with intravenous contrast.  CONTRAST:  10mL MULTIHANCE GADOBENATE DIMEGLUMINE 529 MG/ML IV SOLN  COMPARISON:  Lumbar spine MRI 07/22/2015  FINDINGS: Segmentation:  Normal  Alignment: Grade 1 retrolisthesis at L1-L2, L2-L3 and grade 1 anterolisthesis at L3-L4.  Vertebrae:  There is posterior decompression at L3 and L4. No acute compression fracture. No aggressive marrow lesion.  Conus medullaris: Extends to the L1-2 level and appears normal.  Paraspinal and other soft tissues: Negative.  Disc levels:  T12-L1: Small left subarticular protrusion without stenosis.  L1-L2: Small disc osteophyte complex mildly narrows the spinal canal. Moderate bilateral neural foraminal stenosis.  L2-L3: Disc osteophyte complex of medium-size causes mild spinal canal stenosis. Bilateral moderate neural foraminal stenosis. These findings are unchanged.  L3-L4: Worsening central disc extrusion with superior migration. There is also severe facet hypertrophy. There is a small dorsal fluid collection measuring 5 x 5 mm. There is moderate to severe spinal canal stenosis. Moderate right and severe left neural foraminal stenosis. Stenoses have worsened from the prior study.  L4-L5: Posterior decompression with wide patency of the spinal canal. Severe bilateral neural foraminal stenosis, right greater than left, unchanged.  L5-S1: Posterior decompression with wide patency of the spinal canal. Mild left neural foraminal stenosis, unchanged.  Visualized sacrum: Normal.  IMPRESSION: 1. Moderate to severe spinal canal stenosis at L3-L4 secondary to worsening central disc extrusion with superior migration. Moderate right and severe left neural foraminal stenosis is also worsened from the prior study. 2. L4-S1 posterior decompression with wide patency of the spinal canal. Unchanged severe bilateral L4-L5 neural foraminal stenosis, right worse than left. 3. Moderate bilateral neural foraminal stenosis at L1-2 and L2-3.   Electronically Signed   By: Ulyses Jarred M.D.   On: 03/09/2017 19:48  She reports that  has never smoked. she has never used smokeless tobacco. No results for input(s): HGBA1C, LABURIC in the last 8760 hours.  Objective:  VS:  HT:    WT:    BMI:     BP:(!) 159/86  HR:72bpm  TEMP:97.6 F (36.4 C)( )  RESP:95 % Physical Exam  Constitutional: She is oriented to person, place, and time. She appears well-developed and well-nourished. No distress.  HENT:  Head: Normocephalic and atraumatic.  Nose: Nose normal.  Mouth/Throat: Oropharynx is clear and moist.  Eyes: Conjunctivae are normal. Pupils are equal, round, and reactive to light.  Neck: Normal range of motion. Neck supple.  Cardiovascular: Regular rhythm and intact distal pulses.  Pulmonary/Chest: Effort normal. No respiratory distress.  Abdominal: She exhibits no distension. There is no guarding.  Musculoskeletal:  Patient ambulates with a forward flexed spine.  She has pain stiffness with lumbar extension and rotation.  She has no pain with hip rotation.  She has mild tenderness over the greater trochanters bilaterally but not exquisitely and not really reproducing her pain.  She has good distal strength.  She has no clonus.  Neurological: She is alert and oriented to person, place, and time. She exhibits normal muscle tone. Coordination normal.  Skin: Skin is warm. No rash noted. No erythema.  Psychiatric: She  has a normal mood and affect. Her behavior is normal.  Nursing note and vitals reviewed.   Ortho Exam Imaging: No results found.  Past Medical/Family/Surgical/Social History: Medications & Allergies reviewed per EMR Patient Active Problem List   Diagnosis Date Noted  . Open wound of left lower leg 12/26/2016  . Vitamin D deficiency 09/14/2016  . History of total knee replacement, bilateral 09/14/2016  . DJD (degenerative joint disease), cervical 09/14/2016  . 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 High risk medication use 05/20/2016  . Spinal stenosis, lumbar region, with neurogenic claudication 09/08/2015    Class: Chronic  . OSA (obstructive sleep apnea) 10/17/2013  . Dyspnea 09/04/2013  . Atypical chest pain  09/04/2013  . Morbid obesity ([place]) 03/21/2012  . Cancer of lower-outer quadrant of female breast ([place]) 03/15/2012  . Diabetes mellitus type II 02/22/2011  . Coronary artery disease   . Hypertension   . Hyperlipidemia   . OA (osteoarthritis) of knee    Past Medical History:  Diagnosis Date  . Asthma    Albuterol inhaler as needed.Pulmicort neb as needed  . Asthma   . Breast cancer ([place])   . Chronic back pain    spinal stenosis  . Coronary artery disease   . Depression    takes CYmbalta and Wellbutrin daily  . Diabetes mellitus    not on any meds/controlled by diet  . Dyspnea    with exertion occasionally when lies down  . GERD (gastroesophageal reflux disease)    takes Omeprazole daily  . Headache    oocasionally  . History of kidney stones   . Hyperlipidemia    not on any meds  . Hypertension    takes Benazepril,Bystolic,and Amlodipine daily  . IBS (irritable bowel syndrome)   . Joint pain   . Joint swelling   . Nocturia   . OA (osteoarthritis) of knee   . OSA (obstructive sleep apnea)   . Peripheral edema    takes daily as needed  . Peripheral neuropathy   . Pneumonia    hx of-several yrs ago  . RA (rheumatoid arthritis) ([place])   . Urinary frequency   . Urinary urgency   . Weakness    numbness and tingling mainly in left leg occasionally in right.Tingling/numbness in hands   Family History  Problem Relation Age of Onset  . Heart disease Mother   . Cancer Father   . Cancer Brother   . Heart disease Brother    Past Surgical History:  Procedure Laterality Date  . BREAST LUMPECTOMY  1997   RIGHT BREAST  . CARDIAC CATHETERIZATION  04/23/2004   EF 60%  . cataract surgery Bilateral   . COLONOSCOPY    . CORONARY ARTERY BYPASS GRAFT  2005   LIMA GRAFT TO LAD, SAPHENOUS VEIN GRAFT TO THE FIRST DIAGONAL, AND LEFT RADIAL ARTERY GRAFT TO THE OM  . FOOT SURGERY Right   . JOINT REPLACEMENT    . LITHOTRIPSY    . LUMBAR LAMINECTOMY/DECOMPRESSION MICRODISCECTOMY N/A  09/08/2015   Procedure: CENTRAL DECOMPRESSIVE LUMBAR LAMINECTOMIES L3-4, L4-5 AND BILATERAL HEMILAMINECTOMY L5-S1;  Surgeon: Jessy Oto, MD;  Location: [facility];  Service: Orthopedics;  Laterality: N/A;  . nodule removed from left elbow    . SHOULDER ARTHROSCOPY    . TOTAL KNEE ARTHROPLASTY Bilateral   . TRIPLE BYPASS  04/27/05  . US ECHOCARDIOGRAPHY  01/06/2009   EF 55-60%  . WRIST SURGERY Left    Social History  Occupational History  . Occupation: retired    Fish farm manager: UNEMPLOYED  Tobacco Use  . Smoking status: Never Smoker  . Smokeless tobacco: Never Used  Substance and Sexual Activity  . Alcohol use: No    Alcohol/week: 0.0 oz  . Drug use: No  . Sexual activity: Not on file

## 2017-07-03 NOTE — Progress Notes (Addendum)
Office Visit Note  Patient: [name]             Date of Birth: 1945/12/20           MRN: [id number]             PCP: Crist Infante, MD Referring: Crist Infante, MD Visit Date: 07/13/2017 Occupation: @GUAROCC @    Subjective:  Pain in hands and lower back.   History of Present Illness: [name] is a 71 y.o. female with history of seronegative rheumatoid arthritis, osteoarthritis and disc disease. She states she continues to have some discomfort in her bilateral hands and her lower back. Her left knee joint has been swelling recently. Which is been replaced. Her neck continues to hurt.  Activities of Daily Living:  Patient reports morning stiffness for 1  hour.   Patient Reports nocturnal pain.  Difficulty dressing/grooming: Denies Difficulty climbing stairs: Reports Difficulty getting out of chair: Reports Difficulty using hands for taps, buttons, cutlery, and/or writing: Reports   Review of Systems  Constitutional: Positive for fatigue. Negative for night sweats, weight gain, weight loss and weakness.  HENT: Negative for mouth sores, trouble swallowing, trouble swallowing, mouth dryness and nose dryness.   Eyes: Negative for pain, redness, visual disturbance and dryness.  Respiratory: Positive for shortness of breath. Negative for cough and difficulty breathing.   Cardiovascular: Negative.  Positive for hypertension. Negative for chest pain, palpitations, irregular heartbeat and swelling in legs/feet.  Gastrointestinal: Negative.  Negative for blood in stool, constipation and diarrhea.  Endocrine: Negative.  Negative for increased urination.  Genitourinary: Positive for nocturia. Negative for involuntary urination and vaginal dryness.  Musculoskeletal: Positive for arthralgias, joint pain, joint swelling and morning stiffness. Negative for myalgias, muscle weakness, muscle tenderness and myalgias.  Skin: Negative.  Negative for color change, rash, hair loss, skin  tightness, ulcers and sensitivity to sunlight.  Allergic/Immunologic: Negative for susceptible to infections.  Neurological: Negative for dizziness, headaches, memory loss and night sweats.  Hematological: Negative.  Negative for swollen glands.  Psychiatric/Behavioral: Negative.  Negative for depressed mood and sleep disturbance. The patient is not nervous/anxious.     PMFS History:  Patient Active Problem List   Diagnosis Date Noted  . Dependence on nocturnal oxygen therapy 07/10/2017  . Open wound of left lower leg 12/26/2016  . Vitamin D deficiency 09/14/2016  . History of total knee replacement, bilateral 09/14/2016  . DJD (degenerative joint disease), cervical 09/14/2016  . 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 High risk medication use 05/20/2016  . Spinal stenosis, lumbar region, with neurogenic claudication 09/08/2015    Class: Chronic  . OSA (obstructive sleep apnea) 10/17/2013  . Dyspnea 09/04/2013  . Atypical chest pain 09/04/2013  . Morbid obesity ([place]) 03/21/2012  . Cancer of lower-outer quadrant of female breast ([place]) 03/15/2012  . Diabetes mellitus type II 02/22/2011  . Coronary artery disease   . Hypertension   . Hyperlipidemia   . OA (osteoarthritis) of knee     Past Medical History:  Diagnosis Date  . Asthma    Albuterol inhaler as needed.Pulmicort neb as needed  . Asthma   . Breast cancer ([facility])    right - lumpectomy   . Chronic back pain    spinal stenosis  . Coronary artery disease   . Dependence on nocturnal oxygen therapy 07/10/2017   Pt uses 2 liters 02 at night   . Depression    takes CYmbalta and Wellbutrin daily  .  Diabetes mellitus    not on any meds/controlled by diet  . Dyspnea    with exertion occasionally when lies down  . GERD (gastroesophageal reflux disease)    takes Omeprazole daily  . Headache    oocasionally  . History of kidney stones   . Hyperlipidemia    not on any meds  .  Hypertension    takes Benazepril,Bystolic,and Amlodipine daily  . IBS (irritable bowel syndrome)   . Joint pain   . Joint swelling   . Nocturia   . OA (osteoarthritis) of knee   . OSA (obstructive sleep apnea)   . Oxygen deficiency    2 liters at night per pt for OSA- no cpap   . Peripheral edema    takes daily as needed  . Peripheral neuropathy   . Pneumonia    hx of-several yrs ago  . RA (rheumatoid arthritis) ([place])   . Sleep apnea    pt states she uses oxygen at night - no cpap- uses 02 2 liters at night   . Urinary frequency   . Urinary urgency   . Weakness    numbness and tingling mainly in left leg occasionally in right.Tingling/numbness in hands    Family History  Problem Relation Age of Onset  . Heart disease Mother   . Cancer Father   . Cancer Brother   . Heart disease Brother    Past Surgical History:  Procedure Laterality Date  . BREAST LUMPECTOMY  1997   RIGHT BREAST  . CARDIAC CATHETERIZATION  04/23/2004   EF 60%  . cataract surgery Bilateral   . COLONOSCOPY    . CORONARY ARTERY BYPASS GRAFT  2005   LIMA GRAFT TO LAD, SAPHENOUS VEIN GRAFT TO THE FIRST DIAGONAL, AND LEFT RADIAL ARTERY GRAFT TO THE OM  . FOOT SURGERY Right   . JOINT REPLACEMENT    . LITHOTRIPSY    . LUMBAR LAMINECTOMY/DECOMPRESSION MICRODISCECTOMY N/A 09/08/2015   Procedure: CENTRAL DECOMPRESSIVE LUMBAR LAMINECTOMIES L3-4, L4-5 AND BILATERAL HEMILAMINECTOMY L5-S1;  Surgeon: Jessy Oto, MD;  Location: [facility];  Service: Orthopedics;  Laterality: N/A;  . nodule removed from left elbow    . SHOULDER ARTHROSCOPY    . TOTAL KNEE ARTHROPLASTY Bilateral   . TRIPLE BYPASS  04/27/05  . US ECHOCARDIOGRAPHY  01/06/2009   EF 55-60%  . WRIST SURGERY Left    Social History   Social History Narrative  . Not on file     Objective: Vital Signs: BP (!) 177/80   Pulse 68   Resp 14   Ht 5\' 2"  (1.575 m)   Wt 252 lb (114.3 kg)   BMI 46.09 kg/m    Physical Exam  Constitutional: She is oriented  to person, place, and time. She appears well-developed and well-nourished.  HENT:  Head: Normocephalic and atraumatic.  Eyes: Conjunctivae and EOM are normal.  Neck: Normal range of motion.  Cardiovascular: Normal rate, regular rhythm, normal heart sounds and intact distal pulses.  Pulmonary/Chest: Effort normal and breath sounds normal.  Abdominal: Soft. Bowel sounds are normal.  Lymphadenopathy:    She has no cervical adenopathy.  Neurological: She is alert and oriented to person, place, and time.  Skin: Skin is warm and dry. Capillary refill takes less than 2 seconds.  Psychiatric: She has a normal mood and affect. Her behavior is normal.  Nursing note and vitals reviewed.    Musculoskeletal Exam: C-spine and thoracic lumbar spine very limited range of motion. She has discomfort  with range of motion of her lumbar spine. She very limited range of motion of her right shoulder joint. Left shoulder joint, elbow joints, wrist joints, MCPs PIPs DIPs with good range of motion. She has thickening of PIP/DIP joints in her hands. She has bilateral total knee replacement with discomfort range of motion of bilateral knee joints. Ankle joints are good range of motion. She is some pitting edema over bilateral lower extremities.  CDAI Exam: CDAI Homunculus Exam:   Joint Counts:  CDAI Tender Joint count: 0 CDAI Swollen Joint count: 0  Global Assessments:  Patient Global Assessment: 5 Provider Global Assessment: 3  CDAI Calculated Score: 8    Investigation: No additional findings.PLQ eye exam: 02/16/2017 CBC Latest Ref Rng & Units 04/24/2017 12/29/2016 12/15/2016  WBC 3.8 - 10.8 Thousand/uL 7.5 6.7 6.5  Hemoglobin 11.7 - 15.5 g/dL 13.0 13.7 13.1  Hematocrit 35.0 - 45.0 % 38.4 41.1 39.4  Platelets 140 - 400 Thousand/uL 194 176 197   CMP Latest Ref Rng & Units 04/24/2017 12/29/2016 12/15/2016  Glucose 65 - 99 mg/dL 132(H) 146(H) 195(H)  BUN 7 - 25 mg/dL 16 16.3 13  Creatinine 0.60 - 0.93 mg/dL  0.56(L) 0.7 0.70  Sodium 135 - 146 mmol/L 140 142 141  Potassium 3.5 - 5.3 mmol/L 3.8 3.7 4.0  Chloride 98 - 110 mmol/L 102 - 103  CO2 20 - 32 mmol/L 27 29 27   Calcium 8.6 - 10.4 mg/dL 9.0 9.9 9.2  Total Protein 6.1 - 8.1 g/dL 6.8 7.2 6.3  Total Bilirubin 0.2 - 1.2 mg/dL 0.4 0.52 0.5  Alkaline Phos 40 - 150 U/L - 61 52  AST 10 - 35 U/L 21 23 18   ALT 6 - 29 U/L 21 23 17     Imaging: Xr C-arm No Report  Result Date: 06/21/2017 Please see Notes or Procedures tab for imaging impression.   Speciality Comments: No specialty comments available.    Procedures:  No procedures performed Allergies: Statins   Assessment / Plan:     Visit Diagnoses: Rheumatoid arthritis of multiple sites with negative rheumatoid factor [facility]): Patient has no synovitis on examination. Her arthritis seems to be very well controlled on current regimen. We will continue current treatment for right now. She does have some underlying discomfort which I believe is related to osteoarthritis.  High risk medication use - PLQ 200 mg by mouth twice a day Monday through Friday, MTX 0.8 ML subcutaneous every week, folic acid 2 mg by mouth dailyeye exam: 02/16/2017. Her labs are due and January and then every 3 months to monitor for drug toxicity.  History of total knee replacement, bilateral: Chronic pain  DDD (degenerative disc disease), cervical: She has discomfort with range of motion and limitation of motion.  DDD (degenerative disc disease), lumbar: She has severe pain and discomfort in her lumbar region. She states she had recent injections which were helpful.  Spinal stenosis, lumbar region, with neurogenic claudication  History of vitamin D deficiency: She is on supplement.  History of sleep apnea  History of obesity: Weight loss and diet was discussed. It is difficult for her due to limited mobility.  History of diabetes mellitus  History of breast cancer  History of hypertension  History of coronary  artery disease    Orders: No orders of the defined types were placed in this encounter.  No orders of the defined types were placed in this encounter.   Face-to-face time spent with patient was 30 minutes. Greater than 50% of  time was spent in counseling and coordination of care.  Follow-Up Instructions: Return in about 5 months (around 12/11/2017) for Rheumatoid arthritis, Osteoarthritis.   Bo Merino, MD  Note - This record has been created using Editor, commissioning.  Chart creation errors have been sought, but may not always  have been located. Such creation errors do not reflect on  the standard of medical care.

## 2017-07-05 ENCOUNTER — Other Ambulatory Visit: Payer: Self-pay | Admitting: Rheumatology

## 2017-07-05 NOTE — Telephone Encounter (Signed)
Last Visit: 02/13/17 Next Visit: 07/13/17 Labs: 04/24/17 Stable PLQ Eye Exam: 02/16/17 WNL  Okay to refill per Dr. Estanislado Pandy

## 2017-07-10 ENCOUNTER — Encounter: Payer: Self-pay | Admitting: Gastroenterology

## 2017-07-10 ENCOUNTER — Ambulatory Visit (INDEPENDENT_AMBULATORY_CARE_PROVIDER_SITE_OTHER): Payer: PPO | Admitting: Gastroenterology

## 2017-07-10 ENCOUNTER — Ambulatory Visit (AMBULATORY_SURGERY_CENTER): Payer: Self-pay | Admitting: *Deleted

## 2017-07-10 ENCOUNTER — Encounter: Payer: Self-pay | Admitting: *Deleted

## 2017-07-10 ENCOUNTER — Other Ambulatory Visit: Payer: Self-pay

## 2017-07-10 VITALS — Ht 64.0 in | Wt 248.0 lb

## 2017-07-10 VITALS — BP 124/80 | HR 76 | Ht 64.5 in | Wt 248.5 lb

## 2017-07-10 DIAGNOSIS — R110 Nausea: Secondary | ICD-10-CM | POA: Diagnosis not present

## 2017-07-10 DIAGNOSIS — Z9981 Dependence on supplemental oxygen: Secondary | ICD-10-CM

## 2017-07-10 DIAGNOSIS — R197 Diarrhea, unspecified: Secondary | ICD-10-CM

## 2017-07-10 DIAGNOSIS — R1013 Epigastric pain: Secondary | ICD-10-CM | POA: Diagnosis not present

## 2017-07-10 DIAGNOSIS — Z1211 Encounter for screening for malignant neoplasm of colon: Secondary | ICD-10-CM

## 2017-07-10 HISTORY — DX: Dependence on supplemental oxygen: Z99.81

## 2017-07-10 MED ORDER — OMEPRAZOLE 40 MG PO CPDR: 40.0000 mg | DELAYED_RELEASE_CAPSULE | Freq: Two times a day (BID) | ORAL | 3 refills | Status: DC

## 2017-07-10 MED ORDER — NA SULFATE-K SULFATE-MG SULF 17.5-3.13-1.6 GM/177ML PO SOLN: 1.0000 | ORAL | 0 refills | Status: DC

## 2017-07-10 NOTE — Progress Notes (Signed)
Saw pt in [facility] today- pt states she is on 02 at night for her breathing and her OSA- I questioned pt to make sure it is not cpap- she states she has a green tank and uses prongs in her nose- she turns the tank to 2 liters at night. Pt very sure this is NOT cpap. There was bo documentation in epic that pt is on 02.  She states she has to use this 02 for days after any surgery or sedation- She is insistent its oxygen not cpap because after her sleep study in 2015 , she told them she could not wear the cpap .  Cancelled her 12-31 colon in the [facility] and made an OV for today at 230 pm with Dr Fuller Plan.  She has a complictaed med hx as well . Informed pt Dr Fuller Plan will discuss all options today . Instructed to check in at 215 pm 3 rd floor for a 230 pm OV with Dr Fuller Plan.  Lelan Pons [facility]

## 2017-07-10 NOTE — Patient Instructions (Signed)
You have been scheduled for an endoscopy and colonoscopy. Please follow the written instructions given to you at your visit today. Please pick up your prep supplies at the pharmacy within the next 1-3 days. If you use inhalers (even only as needed), please bring them with you on the day of your procedure. Your physician has requested that you go to www.startemmi.com and enter the access code given to you at your visit today. This web site gives a general overview about your procedure. However, you should still follow specific instructions given to you by our office regarding your preparation for the procedure.  We have sent the following medications to your pharmacy for you to pick up at your convenience: Omeprazole 40 mg twice a day.

## 2017-07-10 NOTE — Progress Notes (Addendum)
  History of Present Illness: This is a 71 year old referred by Perini, Mark, MD for the evaluation of diarrhea, epigastric pain, nausea, CRC screening.  She relates problems with diarrhea for many years.  Her diarrhea has not changed.  She states she controls it with over-the-counter medication that might be Imodium but she is unsure.  She also notes occasional problems with nausea and upper abdominal discomfort.  The symptoms are intermittent.  She takes omeprazole daily and she states she takes another acid reducing medication daily but she does not recall the name and it is not on her medication list.  She frequently states that she cannot remember when I asked more details about her history or ask about medications she may have tried in the past.  Last colonoscopy was performed in 2008 and showed left colon diverticulosis.  She uses oxygen at night. Denies weight loss,  constipation, change in stool caliber, melena, hematochezia, vomiting, dysphagia, reflux symptoms, chest pain.    Allergies  Allergen Reactions  . Statins Other (See Comments)    Joint pain and swelling/burning   Outpatient Medications Prior to Visit  Medication Sig Dispense Refill  . albuterol (PROVENTIL HFA;VENTOLIN HFA) 108 (90 BASE) MCG/ACT inhaler Inhale 2 puffs into the lungs every 4 (four) hours as needed for wheezing or shortness of breath (cough, shortness of breath or wheezing.). 1 Inhaler 12  . amLODipine (NORVASC) 5 MG tablet Take 5 mg by mouth Daily.     . aspirin 81 MG tablet Take 81 mg by mouth daily.      . b complex vitamins tablet Take 1 tablet by mouth 2 (two) times daily.      . B-D ALLERGY SYRINGE 1CC/28G 28G X 1/2" 1 ML MISC USE TO INJECT METHOTREXATE ONCE WEEKLY 12 each 4  . baclofen (LIORESAL) 10 MG tablet TAKE 1 TABLET BY MOUTH TWICE A DAY AS NEEDED FOR SPASMS 180 tablet 0  . benazepril (LOTENSIN) 20 MG tablet Take 20 mg by mouth daily.     . budesonide (PULMICORT) 0.5 MG/2ML nebulizer solution Take  0.5 mg by nebulization 2 (two) times daily.    . buPROPion (WELLBUTRIN XL) 150 MG 24 hr tablet     . busPIRone (BUSPAR) 7.5 MG tablet     . Cholecalciferol (VITAMIN D3) 2000 UNITS TABS Take 2,000 Units by mouth.    . collagenase (SANTYL) ointment Apply 1 application topically daily. 30 g 0  . [employer] ALCOHOL SWABS PADS WIPE AREA WITH ALCOHOL PAD PRIOR TO GIVING METHOTREXATE INJECTION 100 each 0  . diclofenac sodium (VOLTAREN) 1 % GEL Apply 2 g topically 4 (four) times daily. 1 Tube 3  . donepezil (ARICEPT) 10 MG tablet     . doxycycline (VIBRAMYCIN) 50 MG capsule Take 2 capsules (100 mg total) by mouth 2 (two) times daily. 14 capsule 0  . DULoxetine (CYMBALTA) 60 MG capsule     . folic acid (FOLVITE) 1 MG tablet Take 2 tablets (2 mg total) by mouth daily. 180 tablet 4  . furosemide (LASIX) 20 MG tablet Take 20 mg by mouth as needed.      . gabapentin (NEURONTIN) 300 MG capsule Take 600 mg by mouth 3 (three) times daily.     . hydrochlorothiazide 25 MG tablet Take 25 mg by mouth daily.      . HYDROmorphone (DILAUDID) 2 MG tablet Take 1 tablet (2 mg total) by mouth every 4 (four) hours as needed for severe pain. 30 tablet 0  .   hydroxychloroquine (PLAQUENIL) 200 MG tablet TAKE 1 TABLET BY MOUTH TWICE A DAY MONDAY THROUGH FRIDAY 120 tablet 1  . loratadine (CLARITIN) 10 MG tablet Take 10 mg by mouth daily.    . meloxicam (MOBIC) 15 MG tablet Take 15 mg by mouth daily.      . methotrexate 50 MG/2ML injection INJECT 0.8 MILLILITER SUBCUTANEOUSLY EVERY WEEK 10 mL 0  . Multiple Vitamin (MULTIVITAMIN) tablet Take 1 tablet by mouth daily.      . nebivolol (BYSTOLIC) 10 MG tablet Take 10 mg by mouth daily.      . omeprazole (PRILOSEC) 20 MG capsule Take 20 mg by mouth daily.     . OXYGEN Inhale 2 L into the lungs at bedtime as needed.    . POTASSIUM PO Take by mouth 2 (two) times daily.     . predniSONE (STERAPRED UNI-PAK 21 TAB) 10 MG (21) TBPK tablet Take as directed 21 tablet 0  . tiZANidine (ZANAFLEX) 2  MG tablet TAKE 1-2 TABLETS (2-4 MG TOTAL) BY MOUTH EVERY 8 (EIGHT) HOURS AS NEEDED FOR MUSCLE SPASMS. 30 tablet 0  . VITAMIN E PO Take 200 mg by mouth daily.     . methotrexate 50 MG/2ML injection INJECT 0.8 ML SUB-Q EVERY WEEK 10 mL 0   No facility-administered medications prior to visit.    Past Medical History:  Diagnosis Date  . Asthma    Albuterol inhaler as needed.Pulmicort neb as needed  . Asthma   . Breast cancer ([facility])    right - lumpectomy   . Chronic back pain    spinal stenosis  . Coronary artery disease   . Dependence on nocturnal oxygen therapy 07/10/2017   Pt uses 2 liters 02 at night   . Depression    takes CYmbalta and Wellbutrin daily  . Diabetes mellitus    not on any meds/controlled by diet  . Dyspnea    with exertion occasionally when lies down  . GERD (gastroesophageal reflux disease)    takes Omeprazole daily  . Headache    oocasionally  . History of kidney stones   . Hyperlipidemia    not on any meds  . Hypertension    takes Benazepril,Bystolic,and Amlodipine daily  . IBS (irritable bowel syndrome)   . Joint pain   . Joint swelling   . Nocturia   . OA (osteoarthritis) of knee   . OSA (obstructive sleep apnea)   . Oxygen deficiency    2 liters at night per pt for OSA- no cpap   . Peripheral edema    takes daily as needed  . Peripheral neuropathy   . Pneumonia    hx of-several yrs ago  . RA (rheumatoid arthritis) ([facility])   . Sleep apnea    pt states she uses oxygen at night - no cpap- uses 02 2 liters at night   . Urinary frequency   . Urinary urgency   . Weakness    numbness and tingling mainly in left leg occasionally in right.Tingling/numbness in hands   Past Surgical History:  Procedure Laterality Date  . BREAST LUMPECTOMY  1997   RIGHT BREAST  . CARDIAC CATHETERIZATION  04/23/2004   EF 60%  . cataract surgery Bilateral   . COLONOSCOPY    . CORONARY ARTERY BYPASS GRAFT  2005   LIMA GRAFT TO LAD, SAPHENOUS VEIN GRAFT TO THE FIRST  DIAGONAL, AND LEFT RADIAL ARTERY GRAFT TO THE OM  . FOOT SURGERY Right   . JOINT REPLACEMENT    .   LITHOTRIPSY    . LUMBAR LAMINECTOMY/DECOMPRESSION MICRODISCECTOMY N/A 09/08/2015   Procedure: CENTRAL DECOMPRESSIVE LUMBAR LAMINECTOMIES L3-4, L4-5 AND BILATERAL HEMILAMINECTOMY L5-S1;  Surgeon: James E Nitka, MD;  Location: [facility];  Service: Orthopedics;  Laterality: N/A;  . nodule removed from left elbow    . SHOULDER ARTHROSCOPY    . TOTAL KNEE ARTHROPLASTY Bilateral   . TRIPLE BYPASS  04/27/05  . US ECHOCARDIOGRAPHY  01/06/2009   EF 55-60%  . WRIST SURGERY Left    Social History   Socioeconomic History  . Marital status: Widowed    Spouse name: None  . Number of children: 2  . Years of education: None  . Highest education level: None  Social Needs  . Financial resource strain: None  . Food insecurity - worry: None  . Food insecurity - inability: None  . Transportation needs - medical: None  . Transportation needs - non-medical: None  Occupational History  . Occupation: retired    Employer: UNEMPLOYED  Tobacco Use  . Smoking status: Never Smoker  . Smokeless tobacco: Never Used  Substance and Sexual Activity  . Alcohol use: No    Alcohol/week: 0.0 oz  . Drug use: No  . Sexual activity: None  Other Topics Concern  . None  Social History Narrative  . None   Family History  Problem Relation Age of Onset  . Heart disease Mother   . Cancer Father   . Cancer Brother   . Heart disease Brother       Review of Systems: Pertinent positive and negative review of systems were noted in the above HPI section. All other review of systems were otherwise negative.    Physical Exam: General: Well developed, well nourished, no acute distress Head: Normocephalic and atraumatic Eyes:  sclerae anicteric, EOMI Ears: Normal auditory acuity Mouth: No deformity or lesions Neck: Supple, no masses or thyromegaly Lungs: Clear throughout to auscultation Heart: Regular rate and rhythm;  no murmurs, rubs or bruits Abdomen: Soft, non tender and non distended. No masses, hepatosplenomegaly or hernias noted. Normal Bowel sounds Rectal: deferred to colonoscopy Musculoskeletal: Symmetrical with no gross deformities  Skin: No lesions on visible extremities Pulses:  Normal pulses noted Extremities: No clubbing, cyanosis, edema or deformities noted Neurological: Alert oriented x 4, memory deficits, otherwise grossly nonfocal Cervical Nodes:  No significant cervical adenopathy Inguinal Nodes: No significant inguinal adenopathy Psychological:  Alert and cooperative. Normal mood and affect  Assessment and Recommendations:  1.  Chronic diarrhea.  Rule out microscopic colitis, IBS-D, medication side effects, etc. Imodium 3 times daily as needed.  Stool for enteric pathogens.  Schedule colonoscopy at [facility] due to O2 useage. The risks (including bleeding, perforation, infection, missed lesions, medication reactions and possible hospitalization or surgery if complications occur), benefits, and alternatives to colonoscopy with possible biopsy and possible polypectomy were discussed with the patient and they consent to proceed.   2.  Intermittent upper abdominal pain and nausea.  Suspected GERD or gastritis.  Increase omeprazole to 40 mg twice daily.  Follow antireflux measures.  Patient is asked to contact us with the other medication she uses for acid reduction.  Schedule EGD at [facility] due to O2 usage.  The risks (including bleeding, perforation, infection, missed lesions, medication reactions and possible hospitalization or surgery if complications occur), benefits, and alternatives to endoscopy with possible biopsy and possible dilation were discussed with the patient and they consent to proceed.    cc: Perini, Mark, MD [address] [place],  [address] 

## 2017-07-10 NOTE — H&P (View-Only) (Signed)
History of Present Illness: This is a 71 year old referred by Crist Infante, MD for the evaluation of diarrhea, epigastric pain, nausea, CRC screening.  She relates problems with diarrhea for many years.  Her diarrhea has not changed.  She states she controls it with over-the-counter medication that might be Imodium but she is unsure.  She also notes occasional problems with nausea and upper abdominal discomfort.  The symptoms are intermittent.  She takes omeprazole daily and she states she takes another acid reducing medication daily but she does not recall the name and it is not on her medication list.  She frequently states that she cannot remember when I asked more details about her history or ask about medications she may have tried in the past.  Last colonoscopy was performed in 2008 and showed left colon diverticulosis.  She uses oxygen at night. Denies weight loss,  constipation, change in stool caliber, melena, hematochezia, vomiting, dysphagia, reflux symptoms, chest pain.    Allergies  Allergen Reactions  . Statins Other (See Comments)    Joint pain and swelling/burning   Outpatient Medications Prior to Visit  Medication Sig Dispense Refill  . albuterol (PROVENTIL HFA;VENTOLIN HFA) 108 (90 BASE) MCG/ACT inhaler Inhale 2 puffs into the lungs every 4 (four) hours as needed for wheezing or shortness of breath (cough, shortness of breath or wheezing.). 1 Inhaler 12  . amLODipine (NORVASC) 5 MG tablet Take 5 mg by mouth Daily.     Marland Kitchen aspirin 81 MG tablet Take 81 mg by mouth daily.      Marland Kitchen b complex vitamins tablet Take 1 tablet by mouth 2 (two) times daily.      . B-D ALLERGY SYRINGE 1CC/28G 28G X 1/2" 1 ML MISC USE TO INJECT METHOTREXATE ONCE WEEKLY 12 each 4  . baclofen (LIORESAL) 10 MG tablet TAKE 1 TABLET BY MOUTH TWICE A DAY AS NEEDED FOR SPASMS 180 tablet 0  . benazepril (LOTENSIN) 20 MG tablet Take 20 mg by mouth daily.     . budesonide (PULMICORT) 0.5 MG/2ML nebulizer solution Take  0.5 mg by nebulization 2 (two) times daily.    Marland Kitchen buPROPion (WELLBUTRIN XL) 150 MG 24 hr tablet     . busPIRone (BUSPAR) 7.5 MG tablet     . Cholecalciferol (VITAMIN D3) 2000 UNITS TABS Take 2,000 Units by mouth.    . collagenase (SANTYL) ointment Apply 1 application topically daily. 30 g 0  . [employer] ALCOHOL SWABS PADS WIPE AREA WITH ALCOHOL PAD PRIOR TO GIVING METHOTREXATE INJECTION 100 each 0  . diclofenac sodium (VOLTAREN) 1 % GEL Apply 2 g topically 4 (four) times daily. 1 Tube 3  . donepezil (ARICEPT) 10 MG tablet     . doxycycline (VIBRAMYCIN) 50 MG capsule Take 2 capsules (100 mg total) by mouth 2 (two) times daily. 14 capsule 0  . DULoxetine (CYMBALTA) 60 MG capsule     . folic acid (FOLVITE) 1 MG tablet Take 2 tablets (2 mg total) by mouth daily. 180 tablet 4  . furosemide (LASIX) 20 MG tablet Take 20 mg by mouth as needed.      . gabapentin (NEURONTIN) 300 MG capsule Take 600 mg by mouth 3 (three) times daily.     . hydrochlorothiazide 25 MG tablet Take 25 mg by mouth daily.      Marland Kitchen HYDROmorphone (DILAUDID) 2 MG tablet Take 1 tablet (2 mg total) by mouth every 4 (four) hours as needed for severe pain. 30 tablet 0  .  hydroxychloroquine (PLAQUENIL) 200 MG tablet TAKE 1 TABLET BY MOUTH TWICE A DAY MONDAY THROUGH FRIDAY 120 tablet 1  . loratadine (CLARITIN) 10 MG tablet Take 10 mg by mouth daily.    . meloxicam (MOBIC) 15 MG tablet Take 15 mg by mouth daily.      . methotrexate 50 MG/2ML injection INJECT 0.8 MILLILITER SUBCUTANEOUSLY EVERY WEEK 10 mL 0  . Multiple Vitamin (MULTIVITAMIN) tablet Take 1 tablet by mouth daily.      . nebivolol (BYSTOLIC) 10 MG tablet Take 10 mg by mouth daily.      Marland Kitchen omeprazole (PRILOSEC) 20 MG capsule Take 20 mg by mouth daily.     . OXYGEN Inhale 2 L into the lungs at bedtime as needed.    Marland Kitchen POTASSIUM PO Take by mouth 2 (two) times daily.     . predniSONE (STERAPRED UNI-PAK 21 TAB) 10 MG (21) TBPK tablet Take as directed 21 tablet 0  . tiZANidine (ZANAFLEX) 2  MG tablet TAKE 1-2 TABLETS (2-4 MG TOTAL) BY MOUTH EVERY 8 (EIGHT) HOURS AS NEEDED FOR MUSCLE SPASMS. 30 tablet 0  . VITAMIN E PO Take 200 mg by mouth daily.     . methotrexate 50 MG/2ML injection INJECT 0.8 ML SUB-Q EVERY WEEK 10 mL 0   No facility-administered medications prior to visit.    Past Medical History:  Diagnosis Date  . Asthma    Albuterol inhaler as needed.Pulmicort neb as needed  . Asthma   . Breast cancer ([place])    right - lumpectomy   . Chronic back pain    spinal stenosis  . Coronary artery disease   . Dependence on nocturnal oxygen therapy 07/10/2017   Pt uses 2 liters 02 at night   . Depression    takes CYmbalta and Wellbutrin daily  . Diabetes mellitus    not on any meds/controlled by diet  . Dyspnea    with exertion occasionally when lies down  . GERD (gastroesophageal reflux disease)    takes Omeprazole daily  . Headache    oocasionally  . History of kidney stones   . Hyperlipidemia    not on any meds  . Hypertension    takes Benazepril,Bystolic,and Amlodipine daily  . IBS (irritable bowel syndrome)   . Joint pain   . Joint swelling   . Nocturia   . OA (osteoarthritis) of knee   . OSA (obstructive sleep apnea)   . Oxygen deficiency    2 liters at night per pt for OSA- no cpap   . Peripheral edema    takes daily as needed  . Peripheral neuropathy   . Pneumonia    hx of-several yrs ago  . RA (rheumatoid arthritis) ([place])   . Sleep apnea    pt states she uses oxygen at night - no cpap- uses 02 2 liters at night   . Urinary frequency   . Urinary urgency   . Weakness    numbness and tingling mainly in left leg occasionally in right.Tingling/numbness in hands   Past Surgical History:  Procedure Laterality Date  . BREAST LUMPECTOMY  1997   RIGHT BREAST  . CARDIAC CATHETERIZATION  04/23/2004   EF 60%  . cataract surgery Bilateral   . COLONOSCOPY    . CORONARY ARTERY BYPASS GRAFT  2005   LIMA GRAFT TO LAD, SAPHENOUS VEIN GRAFT TO THE FIRST  DIAGONAL, AND LEFT RADIAL ARTERY GRAFT TO THE OM  . FOOT SURGERY Right   . JOINT REPLACEMENT    .  LITHOTRIPSY    . LUMBAR LAMINECTOMY/DECOMPRESSION MICRODISCECTOMY N/A 09/08/2015   Procedure: CENTRAL DECOMPRESSIVE LUMBAR LAMINECTOMIES L3-4, L4-5 AND BILATERAL HEMILAMINECTOMY L5-S1;  Surgeon: Jessy Oto, MD;  Location: [facility];  Service: Orthopedics;  Laterality: N/A;  . nodule removed from left elbow    . SHOULDER ARTHROSCOPY    . TOTAL KNEE ARTHROPLASTY Bilateral   . TRIPLE BYPASS  04/27/05  . US ECHOCARDIOGRAPHY  01/06/2009   EF 55-60%  . WRIST SURGERY Left    Social History   Socioeconomic History  . Marital status: Widowed    Spouse name: None  . Number of children: 2  . Years of education: None  . Highest education level: None  Social Needs  . Financial resource strain: None  . Food insecurity - worry: None  . Food insecurity - inability: None  . Transportation needs - medical: None  . Transportation needs - non-medical: None  Occupational History  . Occupation: retired    Fish farm manager: UNEMPLOYED  Tobacco Use  . Smoking status: Never Smoker  . Smokeless tobacco: Never Used  Substance and Sexual Activity  . Alcohol use: No    Alcohol/week: 0.0 oz  . Drug use: No  . Sexual activity: None  Other Topics Concern  . None  Social History Narrative  . None   Family History  Problem Relation Age of Onset  . Heart disease Mother   . Cancer Father   . Cancer Brother   . Heart disease Brother       Review of Systems: Pertinent positive and negative review of systems were noted in the above HPI section. All other review of systems were otherwise negative.    Physical Exam: General: Well developed, well nourished, no acute distress Head: Normocephalic and atraumatic Eyes:  sclerae anicteric, EOMI Ears: Normal auditory acuity Mouth: No deformity or lesions Neck: Supple, no masses or thyromegaly Lungs: Clear throughout to auscultation Heart: Regular rate and rhythm;  no murmurs, rubs or bruits Abdomen: Soft, non tender and non distended. No masses, hepatosplenomegaly or hernias noted. Normal Bowel sounds Rectal: deferred to colonoscopy Musculoskeletal: Symmetrical with no gross deformities  Skin: No lesions on visible extremities Pulses:  Normal pulses noted Extremities: No clubbing, cyanosis, edema or deformities noted Neurological: Alert oriented x 4, memory deficits, otherwise grossly nonfocal Cervical Nodes:  No significant cervical adenopathy Inguinal Nodes: No significant inguinal adenopathy Psychological:  Alert and cooperative. Normal mood and affect  Assessment and Recommendations:  1.  Chronic diarrhea.  Rule out microscopic colitis, IBS-D, medication side effects, etc. Imodium 3 times daily as needed.  Stool for enteric pathogens.  Schedule colonoscopy at [facility] due to O2 useage. The risks (including bleeding, perforation, infection, missed lesions, medication reactions and possible hospitalization or surgery if complications occur), benefits, and alternatives to colonoscopy with possible biopsy and possible polypectomy were discussed with the patient and they consent to proceed.   2.  Intermittent upper abdominal pain and nausea.  Suspected GERD or gastritis.  Increase omeprazole to 40 mg twice daily.  Follow antireflux measures.  Patient is asked to contact us with the other medication she uses for acid reduction.  Schedule EGD at [facility] due to O2 usage.  The risks (including bleeding, perforation, infection, missed lesions, medication reactions and possible hospitalization or surgery if complications occur), benefits, and alternatives to endoscopy with possible biopsy and possible dilation were discussed with the patient and they consent to proceed.    cc: Crist Infante, MD [address] [place], [place] [address]

## 2017-07-12 DIAGNOSIS — M25611 Stiffness of right shoulder, not elsewhere classified: Secondary | ICD-10-CM | POA: Diagnosis not present

## 2017-07-12 DIAGNOSIS — M25511 Pain in right shoulder: Secondary | ICD-10-CM | POA: Diagnosis not present

## 2017-07-12 DIAGNOSIS — M6281 Muscle weakness (generalized): Secondary | ICD-10-CM | POA: Diagnosis not present

## 2017-07-13 ENCOUNTER — Ambulatory Visit (INDEPENDENT_AMBULATORY_CARE_PROVIDER_SITE_OTHER): Payer: PPO | Admitting: Rheumatology

## 2017-07-13 ENCOUNTER — Encounter: Payer: Self-pay | Admitting: Rheumatology

## 2017-07-13 VITALS — BP 177/80 | HR 68 | Resp 14 | Ht 62.0 in | Wt 252.0 lb

## 2017-07-13 DIAGNOSIS — Z8669 Personal history of other diseases of the nervous system and sense organs: Secondary | ICD-10-CM

## 2017-07-13 DIAGNOSIS — M48062 Spinal stenosis, lumbar region with neurogenic claudication: Secondary | ICD-10-CM | POA: Diagnosis not present

## 2017-07-13 DIAGNOSIS — M5030 Other cervical disc degeneration, unspecified cervical region: Secondary | ICD-10-CM

## 2017-07-13 DIAGNOSIS — Z853 Personal history of malignant neoplasm of breast: Secondary | ICD-10-CM

## 2017-07-13 DIAGNOSIS — Z8679 Personal history of other diseases of the circulatory system: Secondary | ICD-10-CM | POA: Diagnosis not present

## 2017-07-13 DIAGNOSIS — Z96653 Presence of artificial knee joint, bilateral: Secondary | ICD-10-CM

## 2017-07-13 DIAGNOSIS — M5136 Other intervertebral disc degeneration, lumbar region: Secondary | ICD-10-CM | POA: Diagnosis not present

## 2017-07-13 DIAGNOSIS — Z79899 Other long term (current) drug therapy: Secondary | ICD-10-CM | POA: Diagnosis not present

## 2017-07-13 DIAGNOSIS — M0609 Rheumatoid arthritis without rheumatoid factor, multiple sites: Secondary | ICD-10-CM

## 2017-07-13 DIAGNOSIS — Z8639 Personal history of other endocrine, nutritional and metabolic disease: Secondary | ICD-10-CM

## 2017-07-13 NOTE — Patient Instructions (Addendum)
Standing Labs We placed an order today for your standing lab work.    Please come back and get your standing labs(CBC, CMP with GFR) in January and every 3 months  We have open lab Monday through Friday from 8:30-11:30 AM and 1:30-4 PM at the office of Dr. Bo Merino.   The office is located at [address] [place], [place], [place] [address] No appointment is necessary.   Labs are drawn by [facility].  You may receive a bill from [facility] for your lab work. If you have any questions regarding directions or hours of operation,  please call [phone].

## 2017-07-20 DIAGNOSIS — C50911 Malignant neoplasm of unspecified site of right female breast: Secondary | ICD-10-CM | POA: Diagnosis not present

## 2017-07-21 ENCOUNTER — Ambulatory Visit
Admission: RE | Admit: 2017-07-21 | Discharge: 2017-07-21 | Disposition: A | Discharge status: Home / Self Care | Payer: PPO | Source: Ambulatory Visit | Attending: Internal Medicine | Admitting: Internal Medicine

## 2017-07-21 DIAGNOSIS — Z139 Encounter for screening, unspecified: Secondary | ICD-10-CM

## 2017-07-21 DIAGNOSIS — Z1231 Encounter for screening mammogram for malignant neoplasm of breast: Secondary | ICD-10-CM | POA: Diagnosis not present

## 2017-07-21 HISTORY — DX: Personal history of irradiation: Z92.3

## 2017-07-21 HISTORY — DX: Personal history of antineoplastic chemotherapy: Z92.21

## 2017-07-24 ENCOUNTER — Encounter: Payer: Self-pay | Admitting: Gastroenterology

## 2017-07-26 ENCOUNTER — Telehealth: Payer: Self-pay | Admitting: Gastroenterology

## 2017-07-26 ENCOUNTER — Ambulatory Visit (INDEPENDENT_AMBULATORY_CARE_PROVIDER_SITE_OTHER): Payer: PPO | Admitting: Specialist

## 2017-07-26 DIAGNOSIS — R197 Diarrhea, unspecified: Secondary | ICD-10-CM

## 2017-07-26 NOTE — Telephone Encounter (Signed)
Patient states she needs to have blood work done before procedure at [facility] on 1.14.19 and wants to know if Dr.Stark can have orders put in.

## 2017-07-26 NOTE — Telephone Encounter (Signed)
CBC, BMET, TSH, tTG, IgA at office

## 2017-07-26 NOTE — Telephone Encounter (Signed)
New orders placed. Patient notified.

## 2017-07-26 NOTE — Telephone Encounter (Signed)
Patient states that at the last office visit she was to have blood work, but thought she was having labs at her primary care so they were not ordered.  She has not had any labs with her primary.  Do you want any labs prior to her colonoscopy on 08/07/17?

## 2017-07-28 ENCOUNTER — Other Ambulatory Visit (INDEPENDENT_AMBULATORY_CARE_PROVIDER_SITE_OTHER): Payer: Medicare HMO

## 2017-07-28 DIAGNOSIS — R197 Diarrhea, unspecified: Secondary | ICD-10-CM

## 2017-07-28 LAB — CBC WITH DIFFERENTIAL/PLATELET
Basophils Absolute: 0 10*3/uL (ref 0.0–0.1)
Basophils Relative: 0.6 % (ref 0.0–3.0)
EOS ABS: 0.1 10*3/uL (ref 0.0–0.7)
EOS PCT: 1 % (ref 0.0–5.0)
HCT: 41 % (ref 36.0–46.0)
Hemoglobin: 13.6 g/dL (ref 12.0–15.0)
LYMPHS ABS: 2.5 10*3/uL (ref 0.7–4.0)
Lymphocytes Relative: 33.6 % (ref 12.0–46.0)
MCHC: 33.2 g/dL (ref 30.0–36.0)
MCV: 92.9 fl (ref 78.0–100.0)
MONO ABS: 0.7 10*3/uL (ref 0.1–1.0)
Monocytes Relative: 9.4 % (ref 3.0–12.0)
Neutro Abs: 4.1 10*3/uL (ref 1.4–7.7)
Neutrophils Relative %: 55.4 % (ref 43.0–77.0)
Platelets: 209 10*3/uL (ref 150.0–400.0)
RBC: 4.41 Mil/uL (ref 3.87–5.11)
RDW: 15.2 % (ref 11.5–15.5)
WBC: 7.4 10*3/uL (ref 4.0–10.5)

## 2017-07-28 LAB — BASIC METABOLIC PANEL
BUN: 13 mg/dL (ref 6–23)
CALCIUM: 9.1 mg/dL (ref 8.4–10.5)
CO2: 31 mEq/L (ref 19–32)
CREATININE: 0.56 mg/dL (ref 0.40–1.20)
Chloride: 100 mEq/L (ref 96–112)
GFR: 112.94 mL/min (ref >60.00)
GLUCOSE: 185 mg/dL — AB (ref 70–99)
POTASSIUM: 3.7 meq/L (ref 3.5–5.1)
Sodium: 140 mEq/L (ref 135–145)

## 2017-07-28 LAB — IGA: IgA: 249 mg/dL (ref 68–378)

## 2017-07-28 LAB — TSH: TSH: 2.93 u[IU]/mL (ref 0.35–4.50)

## 2017-07-31 ENCOUNTER — Other Ambulatory Visit: Payer: Self-pay

## 2017-07-31 ENCOUNTER — Encounter (HOSPITAL_COMMUNITY): Payer: Self-pay | Admitting: Emergency Medicine

## 2017-07-31 LAB — TISSUE TRANSGLUTAMINASE, IGA: (tTG) Ab, IgA: 1 U/mL

## 2017-08-01 ENCOUNTER — Other Ambulatory Visit: Payer: Self-pay | Admitting: *Deleted

## 2017-08-01 MED ORDER — HYDROXYCHLOROQUINE SULFATE 200 MG PO TABS: ORAL_TABLET | ORAL | 0 refills | Status: DC

## 2017-08-01 MED ORDER — METHOTREXATE SODIUM CHEMO INJECTION 50 MG/2ML: INTRAMUSCULAR | 0 refills | Status: DC

## 2017-08-01 NOTE — Telephone Encounter (Signed)
Refill request received via fax   Last Visit: 07/13/17 Next Visit: 12/04/17 Labs: 07/28/17 Elevated glucose  PLQ Eye Exam: 05/20/16 WNL   Okay to refill 30 day supply of PLQ and 90 of MTX per Dr. Estanislado Pandy

## 2017-08-03 ENCOUNTER — Ambulatory Visit (INDEPENDENT_AMBULATORY_CARE_PROVIDER_SITE_OTHER): Payer: Medicare HMO | Admitting: Specialist

## 2017-08-03 ENCOUNTER — Encounter (INDEPENDENT_AMBULATORY_CARE_PROVIDER_SITE_OTHER): Payer: Self-pay | Admitting: Specialist

## 2017-08-03 VITALS — BP 186/86 | HR 72 | Ht 63.0 in | Wt 235.0 lb

## 2017-08-03 DIAGNOSIS — M5136 Other intervertebral disc degeneration, lumbar region: Secondary | ICD-10-CM

## 2017-08-03 DIAGNOSIS — M48062 Spinal stenosis, lumbar region with neurogenic claudication: Secondary | ICD-10-CM | POA: Diagnosis not present

## 2017-08-03 NOTE — Patient Instructions (Addendum)
Avoid bending, stooping and avoid lifting weights greater than 10 lbs. Avoid prolong standing and walking. Avoid frequent bending and stooping  No lifting greater than 10 lbs. May use ice or moist heat for pain. Weight loss is of benefit. Handicap license is approved. Dr. Romona Curls secretary/Assistant will call to arrange for epidural steroid injection Stationary bike or pool walking programs are good for exercise and may not cause as much pain symptoms related to lumbar spinal narrowing.

## 2017-08-03 NOTE — Progress Notes (Signed)
Office Visit Note   Patient: [name]           Date of Birth: Sep 08, 1945           MRN: [id number] Visit Date: 08/03/2017              Requested by: Crist Infante, MD [address] [place], [place] [address] PCP: Crist Infante, MD   Assessment & Plan: Visit Diagnoses: No diagnosis found.  Plan: Avoid bending, stooping and avoid lifting weights greater than 10 lbs. Avoid prolong standing and walking. Avoid frequent bending and stooping  No lifting greater than 10 lbs. May use ice or moist heat for pain. Weight loss is of benefit. Handicap license is approved. Dr. Romona Curls secretary/Assistant will call to arrange for epidural steroid injection Stationary bike or pool walking programs are good for exercise and may not cause as much pain symptoms related to lumbar spinal narrowing.  Symptoms related to lumbar spinal narrowing.  Follow-Up Instructions: No Follow-up on file.   Orders:  No orders of the defined types were placed in this encounter.  No orders of the defined types were placed in this encounter.     Procedures: No procedures performed   Clinical Data: No additional findings.   Subjective: Chief Complaint  Patient presents with  . Lower Back - Follow-up    MRI Review-- She had Left L3-4 Transforminal injection on 06/21/2017 with Dr. Reggie Pile not get any relief.    72 year old female lost to follow up after being seen in early August 2018. She fell last year while picking up her autistic grandson and injured her right shoulder. She had a MRI in August and the study does show L3-4 extrusion of disc material and severe stenosis, worse than seen prior to her last surgery 09/11/2015 was decompression at right L3-4. L4-5 and bilateral L5-S1. This present pain in the back causes pain with standing and walking. She does a few things then has to sit. She reports leaning again on a cart and using a cane to allow her to get where she is going. This past year had  treatment for breast ca. and is cancer free now since 1998. She underwent right shoulder arthroscopy with attempt at rotator cuff repair by Dr. Durward Fortes.    Review of Systems  Constitutional: Negative.   HENT: Negative.   Eyes: Negative.   Respiratory: Negative.   Cardiovascular: Negative.   Gastrointestinal: Negative.   Endocrine: Negative.   Genitourinary: Negative.   Musculoskeletal: Negative.   Skin: Negative.   Allergic/Immunologic: Negative.   Neurological: Negative.   Hematological: Negative.   Psychiatric/Behavioral: Negative.      Objective: Vital Signs: BP (!) 186/86 (BP Location: Left Arm, Patient Position: Sitting)   Pulse 72   Ht 5\' 3"  (1.6 m)   Wt 235 lb (106.6 kg)   BMI 41.63 kg/m   Physical Exam  Constitutional: She is oriented to person, place, and time. She appears well-developed and well-nourished.  HENT:  Head: Normocephalic and atraumatic.  Eyes: EOM are normal. Pupils are equal, round, and reactive to light.  Neck: Normal range of motion. Neck supple.  Pulmonary/Chest: Effort normal and breath sounds normal.  Abdominal: Soft. Bowel sounds are normal.  Musculoskeletal: Normal range of motion.  Neurological: She is alert and oriented to person, place, and time.  Skin: Skin is warm and dry.  Psychiatric: She has a normal mood and affect. Her behavior is normal. Judgment and thought content normal.  Back Exam   Tenderness  The patient is experiencing tenderness in the lumbar.  Range of Motion  Extension: normal  Flexion: normal  Lateral bend right: normal  Lateral bend left: normal  Rotation right: normal  Rotation left: normal   Muscle Strength  Right Quadriceps:  5/5  Left Quadriceps:  5/5  Right Hamstrings:  5/5   Tests  Straight leg raise right: negative Straight leg raise left: negative  Reflexes  Patellar: Hyporeflexic Achilles: Hyporeflexic Babinski's sign: normal   Other  Toe walk: normal Heel walk:  normal Sensation: normal Gait: normal  Erythema: no back redness Scars: absent      Specialty Comments:  No specialty comments available.  Imaging: No results found.   PMFS History: Patient Active Problem List   Diagnosis Date Noted  . Spinal stenosis, lumbar region, with neurogenic claudication 09/08/2015    Priority: High    Class: Chronic  . Dependence on nocturnal oxygen therapy 07/10/2017  . Open wound of left lower leg 12/26/2016  . Vitamin D deficiency 09/14/2016  . History of total knee replacement, bilateral 09/14/2016  . DJD (degenerative joint disease), cervical 09/14/2016  . 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 High risk medication use 05/20/2016  . OSA (obstructive sleep apnea) 10/17/2013  . Dyspnea 09/04/2013  . Atypical chest pain 09/04/2013  . Morbid obesity ([place]) 03/21/2012  . Cancer of lower-outer quadrant of female breast ([place]) 03/15/2012  . Diabetes mellitus type II 02/22/2011  . Coronary artery disease   . Hypertension   . Hyperlipidemia   . OA (osteoarthritis) of knee    Past Medical History:  Diagnosis Date  . Asthma    Albuterol inhaler as needed.Pulmicort neb as needed  . Asthma   . Breast cancer ([facility])    right - lumpectomy   . Chronic back pain    spinal stenosis  . Coronary artery disease   . Dependence on nocturnal oxygen therapy 07/10/2017   Pt uses 2 liters 02 at night   . Depression    takes CYmbalta and Wellbutrin daily  . Diabetes mellitus    not on any meds/controlled by diet  . Dyspnea    with exertion occasionally when lies down  . GERD (gastroesophageal reflux disease)    takes Omeprazole daily  . Headache    oocasionally  . History of kidney stones   . Hyperlipidemia    not on any meds  . Hypertension    takes Benazepril,Bystolic,and Amlodipine daily  . IBS (irritable bowel syndrome)   . Joint pain   . Joint swelling   . Nocturia   . OA (osteoarthritis) of knee   .  OSA (obstructive sleep apnea)   . Oxygen deficiency    2 liters at night per pt for OSA- no cpap   . Peripheral edema    takes daily as needed  . Peripheral neuropathy   . Personal history of chemotherapy   . Personal history of radiation therapy   . Pneumonia    hx of-several yrs ago  . RA (rheumatoid arthritis) ([place])   . Sleep apnea    pt states she uses oxygen at night - no cpap- uses 02 2 liters at night   . Urinary frequency   . Urinary urgency   . Weakness    numbness and tingling mainly in left leg occasionally in right.Tingling/numbness in hands    Family History  Problem Relation Age of Onset  . Heart disease Mother   .  Cancer Father   . Cancer Brother   . Heart disease Brother     Past Surgical History:  Procedure Laterality Date  . BREAST LUMPECTOMY  1997   RIGHT BREAST  . CARDIAC CATHETERIZATION  04/23/2004   EF 60%  . cataract surgery Bilateral   . COLONOSCOPY    . CORONARY ARTERY BYPASS GRAFT  2005   LIMA GRAFT TO LAD, SAPHENOUS VEIN GRAFT TO THE FIRST DIAGONAL, AND LEFT RADIAL ARTERY GRAFT TO THE OM  . FOOT SURGERY Right   . JOINT REPLACEMENT    . LITHOTRIPSY    . LUMBAR LAMINECTOMY/DECOMPRESSION MICRODISCECTOMY N/A 09/08/2015   Procedure: CENTRAL DECOMPRESSIVE LUMBAR LAMINECTOMIES L3-4, L4-5 AND BILATERAL HEMILAMINECTOMY L5-S1;  Surgeon: Jessy Oto, MD;  Location: [facility];  Service: Orthopedics;  Laterality: N/A;  . nodule removed from left elbow    . SHOULDER ARTHROSCOPY    . TOTAL KNEE ARTHROPLASTY Bilateral   . TRIPLE BYPASS  04/27/05  . US ECHOCARDIOGRAPHY  01/06/2009   EF 55-60%  . WRIST SURGERY Left    Social History   Occupational History  . Occupation: retired    Fish farm manager: UNEMPLOYED  Tobacco Use  . Smoking status: Never Smoker  . Smokeless tobacco: Never Used  Substance and Sexual Activity  . Alcohol use: No    Alcohol/week: 0.0 oz  . Drug use: No  . Sexual activity: Not on file

## 2017-08-07 ENCOUNTER — Encounter (HOSPITAL_COMMUNITY)
Admission: RE | Disposition: A | Discharge status: Home / Self Care | Payer: Self-pay | Source: Ambulatory Visit | Attending: Gastroenterology

## 2017-08-07 ENCOUNTER — Other Ambulatory Visit: Payer: Self-pay

## 2017-08-07 ENCOUNTER — Ambulatory Visit (HOSPITAL_COMMUNITY): Payer: Medicare HMO | Admitting: Certified Registered"

## 2017-08-07 ENCOUNTER — Ambulatory Visit (HOSPITAL_COMMUNITY)
Admission: RE | Admit: 2017-08-07 | Discharge: 2017-08-08 | Disposition: A | Discharge status: Home / Self Care | Payer: Medicare HMO | Source: Ambulatory Visit | Attending: Gastroenterology | Admitting: Gastroenterology

## 2017-08-07 ENCOUNTER — Encounter (HOSPITAL_COMMUNITY): Payer: Self-pay | Admitting: *Deleted

## 2017-08-07 DIAGNOSIS — K529 Noninfective gastroenteritis and colitis, unspecified: Secondary | ICD-10-CM | POA: Diagnosis not present

## 2017-08-07 DIAGNOSIS — Z9981 Dependence on supplemental oxygen: Secondary | ICD-10-CM | POA: Diagnosis not present

## 2017-08-07 DIAGNOSIS — I10 Essential (primary) hypertension: Secondary | ICD-10-CM | POA: Diagnosis not present

## 2017-08-07 DIAGNOSIS — J45909 Unspecified asthma, uncomplicated: Secondary | ICD-10-CM | POA: Insufficient documentation

## 2017-08-07 DIAGNOSIS — Z79899 Other long term (current) drug therapy: Secondary | ICD-10-CM | POA: Diagnosis not present

## 2017-08-07 DIAGNOSIS — M069 Rheumatoid arthritis, unspecified: Secondary | ICD-10-CM | POA: Diagnosis not present

## 2017-08-07 DIAGNOSIS — D120 Benign neoplasm of cecum: Secondary | ICD-10-CM | POA: Diagnosis not present

## 2017-08-07 DIAGNOSIS — K589 Irritable bowel syndrome without diarrhea: Secondary | ICD-10-CM | POA: Diagnosis not present

## 2017-08-07 DIAGNOSIS — Z951 Presence of aortocoronary bypass graft: Secondary | ICD-10-CM | POA: Diagnosis not present

## 2017-08-07 DIAGNOSIS — E1140 Type 2 diabetes mellitus with diabetic neuropathy, unspecified: Secondary | ICD-10-CM | POA: Insufficient documentation

## 2017-08-07 DIAGNOSIS — E785 Hyperlipidemia, unspecified: Secondary | ICD-10-CM | POA: Diagnosis not present

## 2017-08-07 DIAGNOSIS — F329 Major depressive disorder, single episode, unspecified: Secondary | ICD-10-CM | POA: Diagnosis not present

## 2017-08-07 DIAGNOSIS — K219 Gastro-esophageal reflux disease without esophagitis: Secondary | ICD-10-CM

## 2017-08-07 DIAGNOSIS — Z853 Personal history of malignant neoplasm of breast: Secondary | ICD-10-CM | POA: Diagnosis not present

## 2017-08-07 DIAGNOSIS — G4733 Obstructive sleep apnea (adult) (pediatric): Secondary | ICD-10-CM | POA: Insufficient documentation

## 2017-08-07 DIAGNOSIS — Z87442 Personal history of urinary calculi: Secondary | ICD-10-CM | POA: Insufficient documentation

## 2017-08-07 DIAGNOSIS — Z1211 Encounter for screening for malignant neoplasm of colon: Secondary | ICD-10-CM | POA: Insufficient documentation

## 2017-08-07 DIAGNOSIS — I2510 Atherosclerotic heart disease of native coronary artery without angina pectoris: Secondary | ICD-10-CM | POA: Insufficient documentation

## 2017-08-07 DIAGNOSIS — K5730 Diverticulosis of large intestine without perforation or abscess without bleeding: Secondary | ICD-10-CM | POA: Diagnosis not present

## 2017-08-07 DIAGNOSIS — R1013 Epigastric pain: Secondary | ICD-10-CM

## 2017-08-07 DIAGNOSIS — Z7982 Long term (current) use of aspirin: Secondary | ICD-10-CM | POA: Diagnosis not present

## 2017-08-07 DIAGNOSIS — R197 Diarrhea, unspecified: Secondary | ICD-10-CM

## 2017-08-07 HISTORY — PX: ESOPHAGOGASTRODUODENOSCOPY (EGD) WITH PROPOFOL: SHX5813

## 2017-08-07 HISTORY — PX: COLONOSCOPY WITH PROPOFOL: SHX5780

## 2017-08-07 LAB — GLUCOSE, CAPILLARY
Glucose-Capillary: 139 mg/dL — ABNORMAL HIGH (ref 65–99)
Glucose-Capillary: 139 mg/dL — ABNORMAL HIGH (ref 65–99)

## 2017-08-07 SURGERY — COLONOSCOPY WITH PROPOFOL
Anesthesia: Monitor Anesthesia Care

## 2017-08-07 MED ORDER — LIDOCAINE 2% (20 MG/ML) 5 ML SYRINGE
INTRAMUSCULAR | Status: DC | PRN
  Administered 2017-08-07: 60 mg via INTRAVENOUS

## 2017-08-07 MED ORDER — SODIUM CHLORIDE 0.9 % IV SOLN: INTRAVENOUS | Status: DC

## 2017-08-07 MED ORDER — ESMOLOL HCL 100 MG/10ML IV SOLN
INTRAVENOUS | Status: DC | PRN
  Administered 2017-08-07: 5 mg via INTRAVENOUS

## 2017-08-07 MED ORDER — LACTATED RINGERS IV SOLN
INTRAVENOUS | Status: DC
  Administered 2017-08-07: 11:00:00 via INTRAVENOUS

## 2017-08-07 MED ORDER — PROPOFOL 10 MG/ML IV BOLUS
INTRAVENOUS | Status: AC
  Filled 2017-08-07: qty 40

## 2017-08-07 MED ORDER — PROPOFOL 10 MG/ML IV BOLUS
INTRAVENOUS | Status: DC | PRN
  Administered 2017-08-07 (×10): 50 mg via INTRAVENOUS

## 2017-08-07 MED ORDER — LABETALOL HCL 5 MG/ML IV SOLN
INTRAVENOUS | Status: DC | PRN
Start: 2017-08-07 — End: 2017-08-07
  Administered 2017-08-07 (×3): 2.5 mg via INTRAVENOUS

## 2017-08-07 SURGICAL SUPPLY — 25 items

## 2017-08-07 NOTE — Anesthesia Preprocedure Evaluation (Addendum)
Anesthesia Evaluation  Patient identified by MRN, date of birth, ID band Patient awake    Reviewed: Allergy & Precautions, NPO status , Patient's Chart, lab work & pertinent test results  Airway Mallampati: II  TM Distance: >3 FB     Dental  (+) Edentulous Upper, Partial Lower, Dental Advisory Given   Pulmonary shortness of breath, asthma , sleep apnea , pneumonia,    breath sounds clear to auscultation       Cardiovascular hypertension, + CAD   Rhythm:Regular Rate:Normal     Neuro/Psych    GI/Hepatic Neg liver ROS, GERD  ,  Endo/Other  diabetes  Renal/GU negative Renal ROS     Musculoskeletal   Abdominal   Peds  Hematology   Anesthesia Other Findings   Reproductive/Obstetrics                            Anesthesia Physical Anesthesia Plan  ASA: III  Anesthesia Plan: MAC   Post-op Pain Management:    Induction: Intravenous  PONV Risk Score and Plan: 2 and Treatment may vary due to age or medical condition and Propofol infusion  Airway Management Planned: Nasal Cannula  Additional Equipment:   Intra-op Plan:   Post-operative Plan:   Informed Consent: I have reviewed the patients History and Physical, chart, labs and discussed the procedure including the risks, benefits and alternatives for the proposed anesthesia with the patient or authorized representative who has indicated his/her understanding and acceptance.   Dental advisory given  Plan Discussed with: CRNA and Anesthesiologist  Anesthesia Plan Comments:         Anesthesia Quick Evaluation

## 2017-08-07 NOTE — Transfer of Care (Signed)
Immediate Anesthesia Transfer of Care Note  Patient: [name]  Procedure(s) Performed: COLONOSCOPY WITH PROPOFOL (N/A ) ESOPHAGOGASTRODUODENOSCOPY (EGD) WITH PROPOFOL (N/A )  Patient Location: PACU and Endoscopy Unit  Anesthesia Type:MAC  Level of Consciousness: sedated  Airway & Oxygen Therapy: Patient Spontanous Breathing and Patient connected to face mask oxygen  Post-op Assessment: Report given to RN and Post -op Vital signs reviewed and stable  Post vital signs: Reviewed and stable  Last Vitals:  Vitals:   08/07/17 1030  BP: (!) 192/99  Pulse: 75  Resp: (!) 22  Temp: 36.8 C  SpO2: 100%    Last Pain:  Vitals:   08/07/17 1030  TempSrc: Oral         Complications: No apparent anesthesia complications

## 2017-08-07 NOTE — Op Note (Signed)
[facility] Patient Name: [name] Procedure Date: 08/07/2017 MRN: [id number] Attending MD: Ladene Artist , MD Date of Birth: 1946/01/29 CSN: [id number] Age: 72 Admit Type: Outpatient Procedure:                Colonoscopy Indications:              Chronic diarrhea Providers:                Pricilla Riffle. Fuller Plan, MD, Kingsley Plan, RN, Cletis Athens, Technician Referring MD:             Jeannette How. Perini, MD Medicines:                Monitored Anesthesia Care Complications:            No immediate complications. Estimated blood loss:                            None. Estimated Blood Loss:     Estimated blood loss: none. Procedure:                Pre-Anesthesia Assessment:                           - Prior to the procedure, a History and Physical                            was performed, and patient medications and                            allergies were reviewed. The patient's tolerance of                            previous anesthesia was also reviewed. The risks                            and benefits of the procedure and the sedation                            options and risks were discussed with the patient.                            All questions were answered, and informed consent                            was obtained. Prior Anticoagulants: The patient has                            taken no previous anticoagulant or antiplatelet                            agents. ASA Grade Assessment: III - A patient with                            severe  systemic disease. After reviewing the risks                            and benefits, the patient was deemed in                            satisfactory condition to undergo the procedure.                           After obtaining informed consent, the colonoscope                            was passed under direct vision. Throughout the                            procedure, the patient's blood  pressure, pulse, and                            oxygen saturations were monitored continuously. The                            EC-[id number] ([id number]) scope was introduced through                            the anus and advanced to the the cecum, identified                            by appendiceal orifice and ileocecal valve. The                            ileocecal valve, appendiceal orifice, and rectum                            were photographed. The quality of the bowel                            preparation was adequate. The colonoscopy was                            performed without difficulty. The patient tolerated                            the procedure well. Scope In: 11:41:37 AM Scope Out: 12:00:47 PM Scope Withdrawal Time: 0 hours 12 minutes 14 seconds  Total Procedure Duration: 0 hours 19 minutes 10 seconds  Findings:      The perianal and digital rectal examinations were normal.      A 7 mm polyp was found in the cecum. The polyp was sessile. The polyp       was removed with a cold snare. Resection and retrieval were complete.      Many medium-mouthed diverticula were found in the left colon. There was       narrowing of the colon in association with the diverticular opening.       There was evidence of diverticular spasm. There was evidence of  an       impacted diverticulum. There was no evidence of diverticular bleeding.      The exam was otherwise without abnormality on direct and retroflexion       views. Random biopsies obtained throughout. Impression:               - One 7 mm polyp in the cecum, removed with a cold                            snare. Resected and retrieved.                           - Moderate diverticulosis in the left colon. There                            was narrowing of the colon in association with the                            diverticular opening. There was evidence of                            diverticular spasm. There was evidence of an                             impacted diverticulum. There was no evidence of                            diverticular bleeding.                           - The examination was otherwise normal on direct                            and retroflexion views. Moderate Sedation:      N/A- Per Anesthesia Care Recommendation:           - Repeat colonoscopy in 5 years for surveillance if                            polyp is precancerous, otherwise no plans for                            future screening colonoscopies due to age.                           - Patient has a contact number available for                            emergencies. The signs and symptoms of potential                            delayed complications were discussed with the                            patient. Return to normal activities tomorrow.  Written discharge instructions were provided to the                            patient.                           - Resume previous diet.                           - Continue present medications.                           - Await pathology results. Procedure Code(s):        --- Professional ---                           [phone], Colonoscopy, flexible; with removal of                            tumor(s), polyp(s), or other lesion(s) by snare                            technique Diagnosis Code(s):        --- Professional ---                           D12.0, Benign neoplasm of cecum                           K52.9, Noninfective gastroenteritis and colitis,                            unspecified                           K57.30, Diverticulosis of large intestine without                            perforation or abscess without bleeding CPT copyright 2016 American [employer] Association. All rights reserved. The codes documented in this report are preliminary and upon coder review may  be revised to meet current compliance requirements. Ladene Artist, MD 08/07/2017  12:03:43 PM This report has been signed electronically. Number of Addenda: 0

## 2017-08-07 NOTE — Anesthesia Postprocedure Evaluation (Signed)
Anesthesia Post Note  Patient: [name]  Procedure(s) Performed: COLONOSCOPY WITH PROPOFOL (N/A ) ESOPHAGOGASTRODUODENOSCOPY (EGD) WITH PROPOFOL (N/A )     Patient location during evaluation: Endoscopy Anesthesia Type: MAC Level of consciousness: awake Pain management: pain level controlled Vital Signs Assessment: post-procedure vital signs reviewed and stable Respiratory status: spontaneous breathing Cardiovascular status: stable Anesthetic complications: no    Last Vitals:  Vitals:   08/07/17 1220 08/07/17 1230  BP: (!) 146/73 (!) 167/75  Pulse: 71 61  Resp: 18 14  Temp: 36.5 C   SpO2: 99% 98%    Last Pain:  Vitals:   08/07/17 1220  TempSrc: Oral                 Searra Carnathan

## 2017-08-07 NOTE — Op Note (Signed)
[facility] Patient Name: [name] Procedure Date: 08/07/2017 MRN: [id number] Attending MD: Ladene Artist , MD Date of Birth: [birth year] CSN: [id number] Age: 72 Admit Type: Outpatient Procedure:                Upper GI endoscopy Indications:              Epigastric abdominal pain, Gastroesophageal reflux                            disease Providers:                Pricilla Riffle. Fuller Plan, MD, Kingsley Plan, RN, Cletis Athens, Technician Referring MD:             Jeannette How. Perini, MD Medicines:                Monitored Anesthesia Care Complications:            No immediate complications. Estimated Blood Loss:     Estimated blood loss: none. Procedure:                Pre-Anesthesia Assessment:                           - Prior to the procedure, a History and Physical                            was performed, and patient medications and                            allergies were reviewed. The patient's tolerance of                            previous anesthesia was also reviewed. The risks                            and benefits of the procedure and the sedation                            options and risks were discussed with the patient.                            All questions were answered, and informed consent                            was obtained. Prior Anticoagulants: The patient has                            taken no previous anticoagulant or antiplatelet                            agents. ASA Grade Assessment: III - A patient with  severe systemic disease. After reviewing the risks                            and benefits, the patient was deemed in                            satisfactory condition to undergo the procedure.                           After obtaining informed consent, the endoscope was                            passed under direct vision. Throughout the                            procedure, the  patient's blood pressure, pulse, and                            oxygen saturations were monitored continuously. The                            [id number] ([id number]) scope was introduced through the                            mouth, and advanced to the second part of duodenum.                            The upper GI endoscopy was accomplished without                            difficulty. The patient tolerated the procedure                            well. Scope In: Scope Out: Findings:      The esophagus was normal.      The stomach was normal.      The examined duodenum was normal.      The cardia and gastric fundus were normal on retroflexion. Impression:               - Normal esophagus.                           - Normal stomach.                           - Normal examined duodenum.                           - No specimens collected. Moderate Sedation:      N/A- Per Anesthesia Care Recommendation:           - Patient has a contact number available for                            emergencies. The signs and symptoms of potential  delayed complications were discussed with the                            patient. Return to normal activities tomorrow.                            Written discharge instructions were provided to the                            patient.                           - Resume previous diet.                           - Continue present medications.                           - Follow up with PCP as planned. Procedure Code(s):        --- Professional ---                           [phone], Esophagogastroduodenoscopy, flexible,                            transoral; diagnostic, including collection of                            specimen(s) by brushing or washing, when performed                            (separate procedure) Diagnosis Code(s):        --- Professional ---                           R10.13, Epigastric pain                            K21.9, Gastro-esophageal reflux disease without                            esophagitis CPT copyright 2016 American Medical Association. All rights reserved. The codes documented in this report are preliminary and upon coder review may  be revised to meet current compliance requirements. Ladene Artist, MD 08/07/2017 12:13:01 PM This report has been signed electronically. Number of Addenda: 0

## 2017-08-07 NOTE — Interval H&P Note (Signed)
History and Physical Interval Note:  08/07/2017 11:24 AM  [name]  has presented today for surgery, with the diagnosis of diarrhea/epigastric pain/db  The various methods of treatment have been discussed with the patient and family. After consideration of risks, benefits and other options for treatment, the patient has consented to  Procedure(s): COLONOSCOPY WITH PROPOFOL (N/A) ESOPHAGOGASTRODUODENOSCOPY (EGD) WITH PROPOFOL (N/A) as a surgical intervention .  The patient's history has been reviewed, patient examined, no change in status, stable for surgery.  I have reviewed the patient's chart and labs.  Questions were answered to the patient's satisfaction.     Pricilla Riffle. Fuller Plan

## 2017-08-07 NOTE — Anesthesia Procedure Notes (Signed)
Date/Time: 08/07/2017 11:35 AM Performed by: Cynda Familia, CRNA Pre-anesthesia Checklist: Patient identified, Emergency Drugs available, Suction available, Patient being monitored and Timeout performed Oxygen Delivery Method: Nasal cannula Placement Confirmation: positive ETCO2 and breath sounds checked- equal and bilateral Dental Injury: Teeth and Oropharynx as per pre-operative assessment

## 2017-08-07 NOTE — Discharge Instructions (Signed)
YOU HAD AN ENDOSCOPIC PROCEDURE TODAY: Refer to the procedure report and other information in the discharge instructions given to you for any specific questions about what was found during the examination. If this information does not answer your questions, please call [place] office at [phone] to clarify.  ° °YOU SHOULD EXPECT: Some feelings of bloating in the abdomen. Passage of more gas than usual. Walking can help get rid of the air that was put into your GI tract during the procedure and reduce the bloating. If you had a lower endoscopy (such as a colonoscopy or flexible sigmoidoscopy) you may notice spotting of blood in your stool or on the toilet paper. Some abdominal soreness may be present for a day or two, also. ° °DIET: Your first meal following the procedure should be a light meal and then it is ok to progress to your normal diet. A half-sandwich or bowl of soup is an example of a good first meal. Heavy or fried foods are harder to digest and may make you feel nauseous or bloated. Drink plenty of fluids but you should avoid alcoholic beverages for 24 hours. If you had a esophageal dilation, please see attached instructions for diet.   ° °ACTIVITY: Your care partner should take you home directly after the procedure. You should plan to take it easy, moving slowly for the rest of the day. You can resume normal activity the day after the procedure however YOU SHOULD NOT DRIVE, use power tools, machinery or perform tasks that involve climbing or major physical exertion for 24 hours (because of the sedation medicines used during the test).  ° °SYMPTOMS TO REPORT IMMEDIATELY: °A gastroenterologist can be reached at any hour. Please call [phone]  for any of the following symptoms:  °Following lower endoscopy (colonoscopy, flexible sigmoidoscopy) °Excessive amounts of blood in the stool  °Significant tenderness, worsening of abdominal pains  °Swelling of the abdomen that is new, acute  °Fever of 100° or  higher  °Following upper endoscopy (EGD, EUS, ERCP, esophageal dilation) °Vomiting of blood or coffee ground material  °New, significant abdominal pain  °New, significant chest pain or pain under the shoulder blades  °Painful or persistently difficult swallowing  °New shortness of breath  °Black, tarry-looking or red, bloody stools ° °FOLLOW UP:  °If any biopsies were taken you will be contacted by phone or by letter within the next 1-3 weeks. Call [phone]  if you have not heard about the biopsies in 3 weeks.  °Please also call with any specific questions about appointments or follow up tests. ° °

## 2017-08-08 ENCOUNTER — Encounter (HOSPITAL_COMMUNITY): Payer: Self-pay | Admitting: Gastroenterology

## 2017-08-09 ENCOUNTER — Telehealth (INDEPENDENT_AMBULATORY_CARE_PROVIDER_SITE_OTHER): Payer: Self-pay | Admitting: Physical Medicine and Rehabilitation

## 2017-08-09 NOTE — Telephone Encounter (Signed)
There is already a referral in place from [place]?

## 2017-08-10 ENCOUNTER — Encounter: Payer: Self-pay | Admitting: Gastroenterology

## 2017-08-23 ENCOUNTER — Encounter (INDEPENDENT_AMBULATORY_CARE_PROVIDER_SITE_OTHER): Payer: Self-pay | Admitting: Physical Medicine and Rehabilitation

## 2017-08-23 ENCOUNTER — Ambulatory Visit (INDEPENDENT_AMBULATORY_CARE_PROVIDER_SITE_OTHER): Payer: Medicare HMO | Admitting: Physical Medicine and Rehabilitation

## 2017-08-23 ENCOUNTER — Ambulatory Visit (INDEPENDENT_AMBULATORY_CARE_PROVIDER_SITE_OTHER): Payer: Medicare HMO

## 2017-08-23 VITALS — BP 146/77 | HR 74 | Temp 98.0°F

## 2017-08-23 DIAGNOSIS — M5416 Radiculopathy, lumbar region: Secondary | ICD-10-CM | POA: Diagnosis not present

## 2017-08-23 DIAGNOSIS — M48062 Spinal stenosis, lumbar region with neurogenic claudication: Secondary | ICD-10-CM

## 2017-08-23 DIAGNOSIS — M5116 Intervertebral disc disorders with radiculopathy, lumbar region: Secondary | ICD-10-CM | POA: Diagnosis not present

## 2017-08-23 MED ORDER — BETAMETHASONE SOD PHOS & ACET 6 (3-3) MG/ML IJ SUSP
12.0000 mg | Freq: Once | INTRAMUSCULAR | Status: AC
  Administered 2017-08-23: 12 mg

## 2017-08-23 NOTE — Patient Instructions (Signed)

## 2017-08-23 NOTE — Progress Notes (Deleted)
Pt states constant pain in lower back that radiates down to the left leg, with some numbness in her left leg. Pt states pain has been going on for awhile. Pt states normal activities especially walking a long distance makes pain worse, sitting in recliner makes pain better. Pt states last injection helped only a little bit.  +Driver, -BT, -Dye Allergies

## 2017-08-24 NOTE — Procedures (Signed)
[name] is a 72 year old female who comes in today at the request of Dr. Louanne Skye for bilateral L3 transforaminal epidural steroid injections.  She is having right much more than left hip and thigh pain consistent with an L3 distribution. The injection  will be diagnostic and hopefully therapeutic. The patient has failed conservative care including time, medications and activity modification.  Lumbosacral Transforaminal Epidural Steroid Injection - Sub-Pedicular Approach with Fluoroscopic Guidance  Patient: [name]      Date of Birth: Mar 31, 1946 MRN: [id number] PCP: Crist Infante, MD      Visit Date: 08/23/2017   Universal Protocol:    Date/Time: 08/23/2017  Consent Given By: the patient  Position: PRONE  Additional Comments: Vital signs were monitored before and after the procedure. Patient was prepped and draped in the usual sterile fashion. The correct patient, procedure, and site was verified.   Injection Procedure Details:  Procedure Site One Meds Administered:  Meds ordered this encounter  Medications  . betamethasone acetate-betamethasone sodium phosphate (CELESTONE) injection 12 mg    Laterality: Bilateral  Location/Site:  L3-L4  Needle size: 22 G  Needle type: Spinal  Needle Placement: Transforaminal  Findings:    -Comments: Excellent flow of contrast along the nerve and into the epidural space.  Procedure Details: After squaring off the end-plates to get a true AP view, the C-arm was positioned so that an oblique view of the foramen as noted above was visualized. The target area is just inferior to the "nose of the scotty dog" or sub pedicular. The soft tissues overlying this structure were infiltrated with 2-3 ml. of 1% Lidocaine without Epinephrine.  The spinal needle was inserted toward the target using a "trajectory" view along the fluoroscope beam.  Under AP and lateral visualization, the needle was advanced so it did not puncture dura and was  located close the 6 O'Clock position of the pedical in AP tracterory. Biplanar projections were used to confirm position. Aspiration was confirmed to be negative for CSF and/or blood. A 1-2 ml. volume of Isovue-250 was injected and flow of contrast was noted at each level. Radiographs were obtained for documentation purposes.   After attaining the desired flow of contrast documented above, a 0.5 to 1.0 ml test dose of 0.25% Marcaine was injected into each respective transforaminal space.  The patient was observed for 90 seconds post injection.  After no sensory deficits were reported, and normal lower extremity motor function was noted,   the above injectate was administered so that equal amounts of the injectate were placed at each foramen (level) into the transforaminal epidural space.   Additional Comments:  The patient tolerated the procedure well Dressing: Band-Aid    Post-procedure details: Patient was observed during the procedure. Post-procedure instructions were reviewed.  Patient left the clinic in stable condition.   Pertinent Imaging: MRI LUMBAR SPINE WITHOUT AND WITH CONTRAST  TECHNIQUE: Multiplanar and multiecho pulse sequences of the lumbar spine were obtained without and with intravenous contrast.  CONTRAST:  77mL MULTIHANCE GADOBENATE DIMEGLUMINE 529 MG/ML IV SOLN  COMPARISON:  Lumbar spine MRI 07/22/2015  FINDINGS: Segmentation:  Normal  Alignment: Grade 1 retrolisthesis at L1-L2, L2-L3 and grade 1 anterolisthesis at L3-L4.  Vertebrae: There is posterior decompression at L3 and L4. No acute compression fracture. No aggressive marrow lesion.  Conus medullaris: Extends to the L1-2 level and appears normal.  Paraspinal and other soft tissues: Negative.  Disc levels:  T12-L1: Small left subarticular protrusion without stenosis.  L1-L2: Small disc  osteophyte complex mildly narrows the spinal canal. Moderate bilateral neural foraminal  stenosis.  L2-L3: Disc osteophyte complex of medium-size causes mild spinal canal stenosis. Bilateral moderate neural foraminal stenosis. These findings are unchanged.  L3-L4: Worsening central disc extrusion with superior migration. There is also severe facet hypertrophy. There is a small dorsal fluid collection measuring 5 x 5 mm. There is moderate to severe spinal canal stenosis. Moderate right and severe left neural foraminal stenosis. Stenoses have worsened from the prior study.  L4-L5: Posterior decompression with wide patency of the spinal canal. Severe bilateral neural foraminal stenosis, right greater than left, unchanged.  L5-S1: Posterior decompression with wide patency of the spinal canal. Mild left neural foraminal stenosis, unchanged.  Visualized sacrum: Normal.  IMPRESSION: 1. Moderate to severe spinal canal stenosis at L3-L4 secondary to worsening central disc extrusion with superior migration. Moderate right and severe left neural foraminal stenosis is also worsened from the prior study. 2. L4-S1 posterior decompression with wide patency of the spinal canal. Unchanged severe bilateral L4-L5 neural foraminal stenosis, right worse than left. 3. Moderate bilateral neural foraminal stenosis at L1-2 and L2-3.   Electronically Signed   By: Ulyses Jarred M.D.   On: 03/09/2017 19:48

## 2017-08-25 DIAGNOSIS — E041 Nontoxic single thyroid nodule: Secondary | ICD-10-CM | POA: Diagnosis not present

## 2017-08-25 DIAGNOSIS — I10 Essential (primary) hypertension: Secondary | ICD-10-CM | POA: Diagnosis not present

## 2017-08-25 DIAGNOSIS — R82998 Other abnormal findings in urine: Secondary | ICD-10-CM | POA: Diagnosis not present

## 2017-08-25 DIAGNOSIS — E1151 Type 2 diabetes mellitus with diabetic peripheral angiopathy without gangrene: Secondary | ICD-10-CM | POA: Diagnosis not present

## 2017-08-25 DIAGNOSIS — E7849 Other hyperlipidemia: Secondary | ICD-10-CM | POA: Diagnosis not present

## 2017-08-31 ENCOUNTER — Encounter (INDEPENDENT_AMBULATORY_CARE_PROVIDER_SITE_OTHER): Payer: Self-pay | Admitting: Specialist

## 2017-08-31 ENCOUNTER — Ambulatory Visit (INDEPENDENT_AMBULATORY_CARE_PROVIDER_SITE_OTHER): Payer: Medicare HMO | Admitting: Specialist

## 2017-08-31 VITALS — BP 173/79 | HR 66 | Ht 63.0 in | Wt 235.0 lb

## 2017-08-31 DIAGNOSIS — F3289 Other specified depressive episodes: Secondary | ICD-10-CM | POA: Diagnosis not present

## 2017-08-31 DIAGNOSIS — C50919 Malignant neoplasm of unspecified site of unspecified female breast: Secondary | ICD-10-CM | POA: Diagnosis not present

## 2017-08-31 DIAGNOSIS — I10 Essential (primary) hypertension: Secondary | ICD-10-CM | POA: Diagnosis not present

## 2017-08-31 DIAGNOSIS — I2510 Atherosclerotic heart disease of native coronary artery without angina pectoris: Secondary | ICD-10-CM | POA: Diagnosis not present

## 2017-08-31 DIAGNOSIS — M48062 Spinal stenosis, lumbar region with neurogenic claudication: Secondary | ICD-10-CM | POA: Diagnosis not present

## 2017-08-31 DIAGNOSIS — Z0000 Encounter for general adult medical examination without abnormal findings: Secondary | ICD-10-CM | POA: Diagnosis not present

## 2017-08-31 DIAGNOSIS — M0689 Other specified rheumatoid arthritis, multiple sites: Secondary | ICD-10-CM | POA: Diagnosis not present

## 2017-08-31 DIAGNOSIS — M5136 Other intervertebral disc degeneration, lumbar region: Secondary | ICD-10-CM | POA: Diagnosis not present

## 2017-08-31 DIAGNOSIS — G4733 Obstructive sleep apnea (adult) (pediatric): Secondary | ICD-10-CM | POA: Diagnosis not present

## 2017-08-31 DIAGNOSIS — E1151 Type 2 diabetes mellitus with diabetic peripheral angiopathy without gangrene: Secondary | ICD-10-CM | POA: Diagnosis not present

## 2017-08-31 NOTE — Progress Notes (Signed)
Office Visit Note   Patient: [name]           Date of Birth: [birth year]           MRN: [id number] Visit Date: 08/31/2017              Requested by: Crist Infante, MD [address] [place], [place] [address] PCP: Crist Infante, MD   Assessment & Plan: Visit Diagnoses:  1. Spinal stenosis of lumbar region with neurogenic claudication    72 year old female with RA and a collapsing degenerative lumbar spine with mild curvature, she is post multiple level lumbar level laminectomy with persistent pain with prolong standing and walking but also discogenic pain. Follow up studies show areas of residual sub articular and foramenal stenosis particularly in the areas of extreme disc collapse L3-4 and L4-5. Here the height of the neuroforamen is narrowed and decompression is not Likely to be of benefit without restoring the height of the disc and this would restore the neuroforamenal size.  She is still pretty much against fusions.Fusion surgery unfortunately would be a lengthy intervention as she woulld require a thoracolumbar fusion with multiple TLIFs or XLIFs. A spinal cord  Stimulator is a consideration as there is nerve compression findings and symptoms. She has to leave for an appointment with her primary care but she will take this into consideration.    Plan: Avoid bending, stooping and avoid lifting weights greater than 10 lbs. Avoid prolong standing and walking. Avoid frequent bending and stooping  No lifting greater than 10 lbs. May use ice or moist heat for pain. Weight loss is of benefit. Handicap license is approved. Call  Dr. Louanne Skye or Alyse Low if you wish for Dr. Romona Curls secretary/Assistant will call to arrange for epidural steroid injection   Follow-Up Instructions: Return in about 8 weeks (around 10/26/2017).   Orders:  No orders of the defined types were placed in this encounter.  No orders of the defined types were placed in this encounter.     Procedures: No  procedures performed   Clinical Data: No additional findings.   Subjective: Chief Complaint  Patient presents with  . Lower Back - Follow-up    Had Bilateral L3-4 TF injection with Dr. Ernestina Patches on 08/23/2017    72 year old female with history of multiple level degenerative disc disease and collapsing areas of foramenal stenosis. She has persistent back pain with claudicaiton symptoms.  Re    Review of Systems  Constitutional: Negative.   HENT: Negative.   Eyes: Negative.   Respiratory: Negative.   Cardiovascular: Negative.   Gastrointestinal: Negative.   Endocrine: Negative.   Genitourinary: Negative.   Musculoskeletal: Negative.   Skin: Negative.   Allergic/Immunologic: Negative.   Neurological: Negative.   Hematological: Negative.   Psychiatric/Behavioral: Negative.      Objective: Vital Signs: BP (!) 173/79 (BP Location: Left Arm, Patient Position: Sitting)   Pulse 66   Ht 5\' 3"  (1.6 m)   Wt 235 lb (106.6 kg)   BMI 41.63 kg/m   Physical Exam  Constitutional: She is oriented to person, place, and time. She appears well-developed and well-nourished.  HENT:  Head: Normocephalic and atraumatic.  Eyes: EOM are normal. Pupils are equal, round, and reactive to light.  Neck: Normal range of motion. Neck supple.  Pulmonary/Chest: Effort normal and breath sounds normal.  Abdominal: Soft. Bowel sounds are normal.  Neurological: She is alert and oriented to person, place, and time.  Skin: Skin is  warm and dry.  Psychiatric: She has a normal mood and affect. Her behavior is normal. Judgment and thought content normal.    Back Exam   Range of Motion  Extension: abnormal  Flexion: abnormal  Lateral bend right: abnormal  Rotation right: abnormal  Rotation left: abnormal   Muscle Strength  Right Quadriceps:  5/5  Left Quadriceps:  5/5  Right Hamstrings:  5/5  Left Hamstrings:  5/5   Tests  Straight leg raise right: negative Straight leg raise left:  negative  Reflexes  Patellar: abnormal Achilles:  Hyporeflexic abnormal Babinski's sign: normal   Other  Heel walk: normal      Specialty Comments:  No specialty comments available.  Imaging: No results found.   PMFS History: Patient Active Problem List   Diagnosis Date Noted  . Spinal stenosis, lumbar region, with neurogenic claudication 09/08/2015    Priority: High    Class: Chronic  . Diarrhea   . Benign neoplasm of cecum   . Abdominal pain, epigastric   . Gastroesophageal reflux disease   . Dependence on nocturnal oxygen therapy 07/10/2017  . Open wound of left lower leg 12/26/2016  . Vitamin D deficiency 09/14/2016  . History of total knee replacement, bilateral 09/14/2016  . DJD (degenerative joint disease), cervical 09/14/2016  . 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 High risk medication use 05/20/2016  . OSA (obstructive sleep apnea) 10/17/2013  . Dyspnea 09/04/2013  . Atypical chest pain 09/04/2013  . Morbid obesity ([place]) 03/21/2012  . Cancer of lower-outer quadrant of female breast ([place]) 03/15/2012  . Diabetes mellitus type II 02/22/2011  . Coronary artery disease   . Hypertension   . Hyperlipidemia   . OA (osteoarthritis) of knee    Past Medical History:  Diagnosis Date  . Asthma    Albuterol inhaler as needed.Pulmicort neb as needed  . Asthma   . Breast cancer ([place])    right - lumpectomy   . Chronic back pain    spinal stenosis  . Coronary artery disease   . Dependence on nocturnal oxygen therapy 07/10/2017   Pt uses 2 liters 02 at night   . Depression    takes CYmbalta and Wellbutrin daily  . Diabetes mellitus    not on any meds/controlled by diet  . Dyspnea    with exertion occasionally when lies down  . GERD (gastroesophageal reflux disease)    takes Omeprazole daily  . Headache    oocasionally  . History of kidney stones   . Hyperlipidemia    not on any meds  . Hypertension    takes  Benazepril,Bystolic,and Amlodipine daily  . IBS (irritable bowel syndrome)   . Joint pain   . Joint swelling   . Nocturia   . OA (osteoarthritis) of knee   . OSA (obstructive sleep apnea)   . Oxygen deficiency    2 liters at night per pt for OSA- no cpap   . Peripheral edema    takes daily as needed  . Peripheral neuropathy   . Personal history of chemotherapy   . Personal history of radiation therapy   . Pneumonia    hx of-several yrs ago  . RA (rheumatoid arthritis) ([place])   . Sleep apnea    pt states she uses oxygen at night - no cpap- uses 02 2 liters at night   . Urinary frequency   . Urinary urgency   . Weakness    numbness and tingling mainly  in left leg occasionally in right.Tingling/numbness in hands    Family History  Problem Relation Age of Onset  . Heart disease Mother   . Cancer Father   . Cancer Brother   . Heart disease Brother     Past Surgical History:  Procedure Laterality Date  . BREAST LUMPECTOMY  1997   RIGHT BREAST  . CARDIAC CATHETERIZATION  04/23/2004   EF 60%  . cataract surgery Bilateral   . COLONOSCOPY    . COLONOSCOPY WITH PROPOFOL N/A 08/07/2017   Procedure: COLONOSCOPY WITH PROPOFOL;  Surgeon: Ladene Artist, MD;  Location: [facility];  Service: Endoscopy;  Laterality: N/A;  . CORONARY ARTERY BYPASS GRAFT  2005   LIMA GRAFT TO LAD, SAPHENOUS VEIN GRAFT TO THE FIRST DIAGONAL, AND LEFT RADIAL ARTERY GRAFT TO THE OM  . ESOPHAGOGASTRODUODENOSCOPY (EGD) WITH PROPOFOL N/A 08/07/2017   Procedure: ESOPHAGOGASTRODUODENOSCOPY (EGD) WITH PROPOFOL;  Surgeon: Ladene Artist, MD;  Location: [facility];  Service: Endoscopy;  Laterality: N/A;  . FOOT SURGERY Right   . JOINT REPLACEMENT    . LITHOTRIPSY    . LUMBAR LAMINECTOMY/DECOMPRESSION MICRODISCECTOMY N/A 09/08/2015   Procedure: CENTRAL DECOMPRESSIVE LUMBAR LAMINECTOMIES L3-4, L4-5 AND BILATERAL HEMILAMINECTOMY L5-S1;  Surgeon: Jessy Oto, MD;  Location: [facility];  Service: Orthopedics;   Laterality: N/A;  . nodule removed from left elbow    . SHOULDER ARTHROSCOPY    . TOTAL KNEE ARTHROPLASTY Bilateral   . TRIPLE BYPASS  04/27/05  . US ECHOCARDIOGRAPHY  01/06/2009   EF 55-60%  . WRIST SURGERY Left    Social History   Occupational History  . Occupation: retired    Fish farm manager: UNEMPLOYED  Tobacco Use  . Smoking status: Never Smoker  . Smokeless tobacco: Never Used  Substance and Sexual Activity  . Alcohol use: No    Alcohol/week: 0.0 oz  . Drug use: No  . Sexual activity: Not on file

## 2017-08-31 NOTE — Patient Instructions (Signed)
Avoid bending, stooping and avoid lifting weights greater than 10 lbs. Avoid prolong standing and walking. Avoid frequent bending and stooping  No lifting greater than 10 lbs. May use ice or moist heat for pain. Weight loss is of benefit. Handicap license is approved. Call  Dr. Louanne Skye or Alyse Low if you wish for Dr. Romona Curls secretary/Assistant will call to arrange for epidural steroid injection

## 2017-09-04 ENCOUNTER — Encounter (INDEPENDENT_AMBULATORY_CARE_PROVIDER_SITE_OTHER): Payer: Self-pay | Admitting: Specialist

## 2017-09-11 DIAGNOSIS — H903 Sensorineural hearing loss, bilateral: Secondary | ICD-10-CM | POA: Diagnosis not present

## 2017-09-12 NOTE — Progress Notes (Signed)
[name] Date of Birth: Feb 20, 1946   History of Present Illness: [name] is seen for follow up CAD.  She is s/p CABG in 2005. Myoview in March 2018 was normal.  Echo in 2015 unremarkable. She was seen by Dr. Elsworth Soho and diagnosed with OSA by at home testing. She is currently using nocturnal oxygen.  Last year she underwent lumbar laminectomy complicated by a dural tear that was repaired. Since that time she has continued to have back pain that limits her activity.   On follow up today she notes significant worsening of dyspnea, wheezing, and coughing. Notes it is progressive over several months. Has a little swelling but not bad. By our scales she has gained 16 lbs. She also has some chest pain at times that is sporadic-mostly a tightness. She states she cannot walk any distance now.   Notes she was given a shot of Repatha at [place] office earlier this month. Tolerated well.  She is statin intolerant.    Current Outpatient Medications on File Prior to Visit  Medication Sig Dispense Refill  . acetaminophen (TYLENOL) 500 MG tablet Take 1,000 mg by mouth 2 (two) times daily as needed for moderate pain.    Marland Kitchen albuterol (PROVENTIL HFA;VENTOLIN HFA) 108 (90 BASE) MCG/ACT inhaler Inhale 2 puffs into the lungs every 4 (four) hours as needed for wheezing or shortness of breath (cough, shortness of breath or wheezing.). 1 Inhaler 12  . amLODipine (NORVASC) 5 MG tablet Take 5 mg by mouth Daily.     Marland Kitchen aspirin 81 MG tablet Take 81 mg by mouth at bedtime.     Marland Kitchen b complex vitamins tablet Take 1 tablet by mouth daily.     . B-D ALLERGY SYRINGE 1CC/28G 28G X 1/2" 1 ML MISC USE TO INJECT METHOTREXATE ONCE WEEKLY 12 each 4  . baclofen (LIORESAL) 10 MG tablet TAKE 1 TABLET BY MOUTH TWICE A DAY AS NEEDED FOR SPASMS 180 tablet 0  . benazepril (LOTENSIN) 20 MG tablet Take 20 mg by mouth 2 (two) times daily.     . budesonide (PULMICORT) 0.5 MG/2ML nebulizer solution Take 0.5 mg by nebulization 2 (two)  times daily.    Marland Kitchen buPROPion (WELLBUTRIN XL) 150 MG 24 hr tablet Take 150 mg by mouth daily.     . busPIRone (BUSPAR) 7.5 MG tablet Take 7.5 mg by mouth daily. May take a second dose of 7.5 mg as needed for anxiety    . Cholecalciferol (VITAMIN D3) 5000 units CAPS Take 5,000 Units by mouth daily.    . collagenase (SANTYL) ointment Apply 1 application topically daily. (Patient taking differently: Apply 1 application topically daily as needed (skin sores). ) 30 g 0  . [employer] ALCOHOL SWABS PADS WIPE AREA WITH ALCOHOL PAD PRIOR TO GIVING METHOTREXATE INJECTION 100 each 0  . diclofenac sodium (VOLTAREN) 1 % GEL Apply 2 g topically 4 (four) times daily. (Patient taking differently: Apply 2 g topically 2 (two) times daily. ) 1 Tube 3  . DULoxetine (CYMBALTA) 60 MG capsule Take 60 mg by mouth daily.     . folic acid (FOLVITE) 1 MG tablet Take 2 tablets (2 mg total) by mouth daily. (Patient taking differently: Take 1 mg by mouth 2 (two) times daily. ) 180 tablet 4  . gabapentin (NEURONTIN) 300 MG capsule Take 600 mg by mouth 3 (three) times daily.     . hydrochlorothiazide 25 MG tablet Take 25 mg by mouth daily.      Marland Kitchen  hydroxychloroquine (PLAQUENIL) 200 MG tablet TAKE 1 TABLET BY MOUTH TWICE A DAY MONDAY THROUGH FRIDAY 40 tablet 0  . loperamide (IMODIUM) 2 MG capsule Take 2 mg by mouth as needed for diarrhea or loose stools.    Marland Kitchen loratadine (CLARITIN) 10 MG tablet Take 10 mg by mouth daily.    . meloxicam (MOBIC) 15 MG tablet Take 15 mg by mouth daily.      . Menthol, Topical Analgesic, (BIOFREEZE EX) Apply 1 application topically 2 (two) times daily.    . methotrexate 50 MG/2ML injection INJECT 0.8 MILLILITER SUBCUTANEOUSLY EVERY WEEK 10 mL 0  . Multiple Vitamin (MULTIVITAMIN) tablet Take 1 tablet by mouth daily.      . Multiple Vitamins-Minerals (HAIR/SKIN/NAILS) TABS Take 1 tablet by mouth daily.    . Na Sulfate-K Sulfate-Mg Sulf (SUPREP BOWEL PREP KIT) 17.5-3.13-1.6 GM/177ML SOLN Take 1 kit by mouth as  directed. 324 mL 0  . nebivolol (BYSTOLIC) 10 MG tablet Take 10 mg by mouth daily.      Marland Kitchen omeprazole (PRILOSEC) 40 MG capsule Take 1 capsule (40 mg total) by mouth 2 (two) times daily. 60 capsule 3  . OVER THE COUNTER MEDICATION Take 1 tablet by mouth 2 (two) times daily. Cherry Flex otc supplement    . OXYGEN Inhale 2 L into the lungs at bedtime as needed.    . Potassium 99 MG TABS Take 99 mg by mouth 2 (two) times daily.    . Probiotic CAPS Take 1 capsule by mouth daily.    . vitamin B-12 (CYANOCOBALAMIN) 1000 MCG tablet Take 1,000 mcg by mouth daily.    . vitamin E 400 UNIT capsule Take 400 Units by mouth daily.     No current facility-administered medications on file prior to visit.     Allergies  Allergen Reactions  . Statins Other (See Comments)    Joint pain and swelling/burning    Past Medical History:  Diagnosis Date  . Asthma    Albuterol inhaler as needed.Pulmicort neb as needed  . Asthma   . Breast cancer ([place])    right - lumpectomy   . Chronic back pain    spinal stenosis  . Coronary artery disease   . Dependence on nocturnal oxygen therapy 07/10/2017   Pt uses 2 liters 02 at night   . Depression    takes CYmbalta and Wellbutrin daily  . Diabetes mellitus    not on any meds/controlled by diet  . Dyspnea    with exertion occasionally when lies down  . GERD (gastroesophageal reflux disease)    takes Omeprazole daily  . Headache    oocasionally  . History of kidney stones   . Hyperlipidemia    not on any meds  . Hypertension    takes Benazepril,Bystolic,and Amlodipine daily  . IBS (irritable bowel syndrome)   . Joint pain   . Joint swelling   . Nocturia   . OA (osteoarthritis) of knee   . OSA (obstructive sleep apnea)   . Oxygen deficiency    2 liters at night per pt for OSA- no cpap   . Peripheral edema    takes daily as needed  . Peripheral neuropathy   . Personal history of chemotherapy   . Personal history of radiation therapy   . Pneumonia     hx of-several yrs ago  . RA (rheumatoid arthritis) ([place])   . Sleep apnea    pt states she uses oxygen at night - no cpap- uses 02  2 liters at night   . Urinary frequency   . Urinary urgency   . Weakness    numbness and tingling mainly in left leg occasionally in right.Tingling/numbness in hands    Past Surgical History:  Procedure Laterality Date  . BREAST LUMPECTOMY  1997   RIGHT BREAST  . CARDIAC CATHETERIZATION  04/23/2004   EF 60%  . cataract surgery Bilateral   . COLONOSCOPY    . COLONOSCOPY WITH PROPOFOL N/A 08/07/2017   Procedure: COLONOSCOPY WITH PROPOFOL;  Surgeon: Ladene Artist, MD;  [facility];  Service: Endoscopy;  Laterality: N/A;  . CORONARY ARTERY BYPASS GRAFT  2005   LIMA GRAFT TO LAD, SAPHENOUS VEIN GRAFT TO THE FIRST DIAGONAL, AND LEFT RADIAL ARTERY GRAFT TO THE OM  . ESOPHAGOGASTRODUODENOSCOPY (EGD) WITH PROPOFOL N/A 08/07/2017   Procedure: ESOPHAGOGASTRODUODENOSCOPY (EGD) WITH PROPOFOL;  Surgeon: Ladene Artist, MD;  Location[facility];  Service: Endoscopy;  Laterality: N/A;  . FOOT SURGERY Right   . JOINT REPLACEMENT    . LITHOTRIPSY    . LUMBAR LAMINECTOMY/DECOMPRESSION MICRODISCECTOMY N/A 09/08/2015   Procedure: CENTRAL DECOMPRESSIVE LUMBAR LAMINECTOMIES L3-4, L4-5 AND BILATERAL HEMILAMINECTOMY L5-S1;  Surgeon: Jessy Oto, MD;  Location: [facility];  Service: Orthopedics;  Laterality: N/A;  . nodule removed from left elbow    . SHOULDER ARTHROSCOPY    . TOTAL KNEE ARTHROPLASTY Bilateral   . TRIPLE BYPASS  04/27/05  . US ECHOCARDIOGRAPHY  01/06/2009   EF 55-60%  . WRIST SURGERY Left     Social History   Tobacco Use  Smoking Status Never Smoker  Smokeless Tobacco Never Used    Social History   Substance and Sexual Activity  Alcohol Use No  . Alcohol/week: 0.0 oz    Family History  Problem Relation Age of Onset  . Heart disease Mother   . Cancer Father   . Cancer Brother   . Heart disease Brother     Review of Systems: As  noted history of present illness.  All other systems were reviewed and are negative.  Physical Exam: BP 138/84   Pulse 80   Ht _0  (1.6 m)   Wt 251 lb (113.9 kg)   BMI 44.46 kg/m  GENERAL:  Well appearing obese WF uses a cane to walk. HEENT:  PERRL, EOMI, sclera are clear. Oropharynx is clear. NECK:  No jugular venous distention, carotid upstroke brisk and symmetric, no bruits, no thyromegaly or adenopathy LUNGS:  Clear to auscultation bilaterally CHEST:  Unremarkable HEART:  RRR,  PMI not displaced or sustained,S1 and S2 within normal limits, no S3, no S4: no clicks, no rubs, no murmurs ABD:  Soft, nontender. BS +, no masses or bruits. No hepatomegaly, no splenomegaly EXT:  2 + pulses throughout, no edema, no cyanosis no clubbing SKIN:  Warm and dry.  No rashes NEURO:  Alert and oriented x 3. Cranial nerves II through XII intact. PSYCH:  Cognitively intact    LABORATORY DATA:  Lab Results  Component Value Date   WBC 7.4 07/28/2017   HGB 13.6 07/28/2017   HCT 41.0 07/28/2017   PLT 209.0 07/28/2017   GLUCOSE 185 (H) 07/28/2017   ALT 21 04/24/2017   AST 21 04/24/2017   NA 140 07/28/2017   K 3.7 07/28/2017   CL 100 07/28/2017   CREATININE 0.56 07/28/2017   BUN 13 07/28/2017   CO2 31 07/28/2017   TSH 2.93 07/28/2017   INR 1.07 04/08/2011   HGBA1C 6.2 (H) 09/03/2015  Labs dated 08/02/16: cholesterol 286, triglycerides 181, HDL 52, LDL 198, A1c 6.5%. Dated 08/25/17: cholesterol 257, triglycerides 121, HDL 59, LDL 174. A1c 7.6%. Normal chemistries, TSH, Hgb.   ECG today shows normal sinus rhythm.  Rate 80.  Nonspecific ST abnormality.  Unchanged from before.I have personally reviewed and interpreted this study.  Echo: 2/16/15Study Conclusions  - Left ventricle: The cavity size was normal. Wall thickness was normal. Systolic function was normal. The estimated ejection fraction was in the range of 50% to 55%. - Mitral valve: Nodular calcification of anterior  leaflet tip Mild regurgitation. - Left atrium: The atrium was moderately dilated. - Atrial septum: No defect or patent foramen ovale was identified. - Pericardium, extracardiac: A trivial pericardial effusion was identified posterior to the heart. - Impressions: Elevated E/E' ratio suggested diastolic dysfunction with elevated EDP Impressions:  - Elevated E/E' ratio suggested diastolic dysfunction with elevated EDP   Myoview 09/29/16: Study Highlights     The left ventricular ejection fraction is mildly decreased (45-54%).  Nuclear stress EF: 51%.  There was no ST segment deviation noted during stress.  The study is normal.  This is a low risk study.   Normal stress nuclear study with no ischemia or infarction; EF 51 with normal wall motion; cannot R/O left breast mass; suggest mammogram if not performed recently.       Assessment / Plan: 1. Coronary disease.  She is status post CABG in 2005. Nuclear stress test in March 2018 was normal. Ecg today shows no changes.  2. Dyspnea on exertion. Component of wheezing and cough. Difficult to tell how much of this is cardiac versus pulmonary versus weight and deconditioning. Will check a D-dimer and BNP level today. Update Echo. If findings of worsening LV function or elevated BNP may need to consider right and left heart cath. If these studies are ok will refer to pulmonary.   3. Hypertension, well controlled  3. Hyperlipidemia. Severely elevated LDL. Statin intolerant. Encourage PCSK 9 inhibitor.  4. Diabetes mellitus type 2.  5. Morbid obesity with OSA

## 2017-09-14 DIAGNOSIS — H04123 Dry eye syndrome of bilateral lacrimal glands: Secondary | ICD-10-CM | POA: Diagnosis not present

## 2017-09-14 DIAGNOSIS — H524 Presbyopia: Secondary | ICD-10-CM | POA: Diagnosis not present

## 2017-09-14 DIAGNOSIS — Z79899 Other long term (current) drug therapy: Secondary | ICD-10-CM | POA: Diagnosis not present

## 2017-09-14 DIAGNOSIS — E119 Type 2 diabetes mellitus without complications: Secondary | ICD-10-CM | POA: Diagnosis not present

## 2017-09-15 ENCOUNTER — Ambulatory Visit: Payer: Medicare HMO | Admitting: Cardiology

## 2017-09-15 ENCOUNTER — Encounter: Payer: Self-pay | Admitting: Cardiology

## 2017-09-15 VITALS — BP 138/84 | HR 80 | Ht 63.0 in | Wt 251.0 lb

## 2017-09-15 DIAGNOSIS — I10 Essential (primary) hypertension: Secondary | ICD-10-CM | POA: Diagnosis not present

## 2017-09-15 DIAGNOSIS — R0609 Other forms of dyspnea: Secondary | ICD-10-CM

## 2017-09-15 DIAGNOSIS — E7800 Pure hypercholesterolemia, unspecified: Secondary | ICD-10-CM

## 2017-09-15 DIAGNOSIS — I2510 Atherosclerotic heart disease of native coronary artery without angina pectoris: Secondary | ICD-10-CM | POA: Diagnosis not present

## 2017-09-15 NOTE — Patient Instructions (Signed)
We will check blood test today  We will schedule you for an Echocardiogram  Continue your current medication for now.

## 2017-09-19 LAB — BRAIN NATRIURETIC PEPTIDE: BNP: 11.6 pg/mL (ref 0.0–100.0)

## 2017-09-19 LAB — D-DIMER, QUANTITATIVE (NOT AT ARMC): D-DIMER: 0.79 mg{FEU}/L — AB (ref 0.00–0.49)

## 2017-09-20 ENCOUNTER — Other Ambulatory Visit: Payer: Self-pay

## 2017-09-20 ENCOUNTER — Ambulatory Visit (HOSPITAL_COMMUNITY): Payer: Medicare HMO | Attending: Cardiovascular Disease

## 2017-09-20 DIAGNOSIS — I119 Hypertensive heart disease without heart failure: Secondary | ICD-10-CM | POA: Insufficient documentation

## 2017-09-20 DIAGNOSIS — I10 Essential (primary) hypertension: Secondary | ICD-10-CM | POA: Diagnosis not present

## 2017-09-20 DIAGNOSIS — R059 Cough, unspecified: Secondary | ICD-10-CM

## 2017-09-20 DIAGNOSIS — E7800 Pure hypercholesterolemia, unspecified: Secondary | ICD-10-CM | POA: Diagnosis not present

## 2017-09-20 DIAGNOSIS — E119 Type 2 diabetes mellitus without complications: Secondary | ICD-10-CM | POA: Insufficient documentation

## 2017-09-20 DIAGNOSIS — R05 Cough: Secondary | ICD-10-CM

## 2017-09-20 DIAGNOSIS — R0609 Other forms of dyspnea: Secondary | ICD-10-CM | POA: Diagnosis not present

## 2017-09-20 DIAGNOSIS — R0602 Shortness of breath: Secondary | ICD-10-CM

## 2017-09-20 DIAGNOSIS — Z951 Presence of aortocoronary bypass graft: Secondary | ICD-10-CM | POA: Diagnosis not present

## 2017-09-20 DIAGNOSIS — I2510 Atherosclerotic heart disease of native coronary artery without angina pectoris: Secondary | ICD-10-CM | POA: Diagnosis not present

## 2017-09-22 ENCOUNTER — Encounter (INDEPENDENT_AMBULATORY_CARE_PROVIDER_SITE_OTHER): Payer: Self-pay | Admitting: Orthopaedic Surgery

## 2017-09-22 ENCOUNTER — Ambulatory Visit (INDEPENDENT_AMBULATORY_CARE_PROVIDER_SITE_OTHER): Payer: Medicare HMO | Admitting: Orthopaedic Surgery

## 2017-09-22 VITALS — BP 161/78 | HR 74 | Resp 14 | Ht 64.0 in | Wt 251.0 lb

## 2017-09-22 DIAGNOSIS — M25511 Pain in right shoulder: Secondary | ICD-10-CM | POA: Diagnosis not present

## 2017-09-22 DIAGNOSIS — G8929 Other chronic pain: Secondary | ICD-10-CM | POA: Diagnosis not present

## 2017-09-22 NOTE — Progress Notes (Signed)
Office Visit Note   Patient: [name]           Date of Birth: Feb 17, 1946           MRN: [id number] Visit Date: 09/22/2017              Requested by: Crist Infante, MD [address] [place], [place] [address] PCP: Crist Infante, MD   Assessment & Plan: Visit Diagnoses:  1. Chronic right shoulder pain     Plan: 6 months status post attempted rotator cuff tear repair right shoulder associated with NSAID and DCR. Had a massive rotator cuff tear with incomplete repair. Definitely better in terms of pain but still has significant limitation of overhead activity. We had a long discussion regarding future treatment options including continuing other exercises. She might be a candidate for reverse shoulder arthroplasty at some point. Talked at length today regarding what she may expect over time and different treatment options including occasional cortisone injection or the above mentioned surgery. She's not ready for that yet. I think she is a good idea of what she can expect over time. She has an excellent attitude  Follow-Up Instructions: Return if symptoms worsen or fail to improve.   Orders:  No orders of the defined types were placed in this encounter.  No orders of the defined types were placed in this encounter.     Procedures: No procedures performed   Clinical Data: No additional findings.   Subjective: Chief Complaint  Patient presents with  . Right Shoulder - Routine Post Op    [name] is a 29 y o S/P 6 months Right RTC repair of massive tear. She relates she has some pains with certain activities. She has issues with combing her hair with an overhead raise.  [name] is 6 months status post debridement of her right shoulder arthroscopically with NSAID, DCR and a partial repair of a massive rotator cuff tear. She is not having much pain. Still has limitation of activity with overhead motion. Fact she is unable to raise her arm over her head and she does well  despite the fact she is right-hand dominant.  HPI  Review of Systems  Constitutional: Negative for chills, fatigue and fever.  HENT: Negative for hearing loss and tinnitus.   Eyes: Negative for itching.  Respiratory: Positive for apnea, cough, chest tightness and shortness of breath.   Cardiovascular: Negative for chest pain, palpitations and leg swelling.  Gastrointestinal: Negative for blood in stool, constipation and diarrhea.  Endocrine: Negative for polyuria.  Genitourinary: Negative for dysuria.  Musculoskeletal: Positive for back pain and neck pain. Negative for joint swelling and neck stiffness.  Allergic/Immunologic: Negative for immunocompromised state.  Neurological: Positive for light-headedness and numbness. Negative for dizziness and headaches.  Hematological: Does not bruise/bleed easily.  Psychiatric/Behavioral: Negative for sleep disturbance. The patient is nervous/anxious.      Objective: Vital Signs: BP (!) 161/78   Pulse 74   Resp 14   Ht 5\' 4"  (1.626 m)   Wt 251 lb (113.9 kg)   BMI 43.08 kg/m   Physical Exam  Ortho Exam awake alert and oriented 3. Very pleasant. Nicely dressed. Hairs [facility]. I could raise her right arm fully overhead but she could not maintain apposition. I can abduct about 90 but she would drift down to about 70. No pain with internal/external rotation. Negative evidence of impingement. No swelling. Neurovascular exam is intact good grip and good release. Significant weakness with internal/external rotation.  Specialty Comments:  No specialty comments available.  Imaging: No results found.   PMFS History: Patient Active Problem List   Diagnosis Date Noted  . Diarrhea   . Benign neoplasm of cecum   . Abdominal pain, epigastric   . Gastroesophageal reflux disease   . Dependence on nocturnal oxygen therapy 07/10/2017  . Open wound of left lower leg 12/26/2016  . Vitamin D deficiency 09/14/2016  . History of total knee  replacement, bilateral 09/14/2016  . DJD (degenerative joint disease), cervical 09/14/2016  . 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 High risk medication use 05/20/2016  . Spinal stenosis, lumbar region, with neurogenic claudication 09/08/2015    Class: Chronic  . OSA (obstructive sleep apnea) 10/17/2013  . Dyspnea 09/04/2013  . Atypical chest pain 09/04/2013  . Morbid obesity ([facility]) 03/21/2012  . Cancer of lower-outer quadrant of female breast ([facility]) 03/15/2012  . Diabetes mellitus type II 02/22/2011  . Coronary artery disease   . Hypertension   . Hyperlipidemia   . OA (osteoarthritis) of knee    Past Medical History:  Diagnosis Date  . Asthma    Albuterol inhaler as needed.Pulmicort neb as needed  . Asthma   . Breast cancer ([facility])    right - lumpectomy   . Chronic back pain    spinal stenosis  . Coronary artery disease   . Dependence on nocturnal oxygen therapy 07/10/2017   Pt uses 2 liters 02 at night   . Depression    takes CYmbalta and Wellbutrin daily  . Diabetes mellitus    not on any meds/controlled by diet  . Dyspnea    with exertion occasionally when lies down  . GERD (gastroesophageal reflux disease)    takes Omeprazole daily  . Headache    oocasionally  . History of kidney stones   . Hyperlipidemia    not on any meds  . Hypertension    takes Benazepril,Bystolic,and Amlodipine daily  . IBS (irritable bowel syndrome)   . Joint pain   . Joint swelling   . Nocturia   . OA (osteoarthritis) of knee   . OSA (obstructive sleep apnea)   . Oxygen deficiency    2 liters at night per pt for OSA- no cpap   . Peripheral edema    takes daily as needed  . Peripheral neuropathy   . Personal history of chemotherapy   . Personal history of radiation therapy   . Pneumonia    hx of-several yrs ago  . RA (rheumatoid arthritis) ([facility])   . Sleep apnea    pt states she uses oxygen at night - no cpap- uses 02 2 liters at night   .  Urinary frequency   . Urinary urgency   . Weakness    numbness and tingling mainly in left leg occasionally in right.Tingling/numbness in hands    Family History  Problem Relation Age of Onset  . Heart disease Mother   . Cancer Father   . Cancer Brother   . Heart disease Brother     Past Surgical History:  Procedure Laterality Date  . BREAST LUMPECTOMY  1997   RIGHT BREAST  . CARDIAC CATHETERIZATION  04/23/2004   EF 60%  . cataract surgery Bilateral   . COLONOSCOPY    . COLONOSCOPY WITH PROPOFOL N/A 08/07/2017   Procedure: COLONOSCOPY WITH PROPOFOL;  Surgeon: Ladene Artist, MD;  Location: [facility];  Service: Endoscopy;  Laterality: N/A;  . CORONARY ARTERY BYPASS GRAFT  2005   LIMA GRAFT TO LAD, SAPHENOUS VEIN GRAFT TO THE FIRST DIAGONAL, AND LEFT RADIAL ARTERY GRAFT TO THE OM  . ESOPHAGOGASTRODUODENOSCOPY (EGD) WITH PROPOFOL N/A 08/07/2017   Procedure: ESOPHAGOGASTRODUODENOSCOPY (EGD) WITH PROPOFOL;  Surgeon: Ladene Artist, MD;  Location: [facility];  Service: Endoscopy;  Laterality: N/A;  . FOOT SURGERY Right   . JOINT REPLACEMENT    . LITHOTRIPSY    . LUMBAR LAMINECTOMY/DECOMPRESSION MICRODISCECTOMY N/A 09/08/2015   Procedure: CENTRAL DECOMPRESSIVE LUMBAR LAMINECTOMIES L3-4, L4-5 AND BILATERAL HEMILAMINECTOMY L5-S1;  Surgeon: Jessy Oto, MD;  Location: [facility];  Service: Orthopedics;  Laterality: N/A;  . nodule removed from left elbow    . SHOULDER ARTHROSCOPY    . TOTAL KNEE ARTHROPLASTY Bilateral   . TRIPLE BYPASS  04/27/05  . US ECHOCARDIOGRAPHY  01/06/2009   EF 55-60%  . WRIST SURGERY Left    Social History   Occupational History  . Occupation: retired    Fish farm manager: UNEMPLOYED  Tobacco Use  . Smoking status: Never Smoker  . Smokeless tobacco: Never Used  Substance and Sexual Activity  . Alcohol use: No    Alcohol/week: 0.0 oz  . Drug use: No  . Sexual activity: Not on file

## 2017-09-27 ENCOUNTER — Other Ambulatory Visit: Payer: Self-pay | Admitting: Rheumatology

## 2017-09-27 NOTE — Telephone Encounter (Signed)
Last Visit: 07/13/17 Next Visit: 12/04/17 Labs: 07/28/17 Elevated glucose   Okay to refill per Dr. Estanislado Pandy

## 2017-11-03 DIAGNOSIS — C50911 Malignant neoplasm of unspecified site of right female breast: Secondary | ICD-10-CM | POA: Diagnosis not present

## 2017-11-20 NOTE — Progress Notes (Cosign Needed)
Office Visit Note  Patient: [name]             Date of Birth: May 15, 1946           MRN: [id number]             PCP: Crist Infante, MD Referring: Crist Infante, MD Visit Date: 12/04/2017 Occupation: @GUAROCC @    Subjective:  Hand pain    History of Present Illness: [name] is a 72 y.o. female with history of seronegative rheumatoid arthritis and osteoarthritis.  Patient states she continues to take Plaquenil 200 mg twice daily Monday through Friday, methotrexate 0.8 mL subcutaneous every week, and folic acid 2 mg daily.  Patient states that she has been having increased discomfort in her bilateral hands and is noticed some swelling in her hands.  She states that she has been having increased difficulty with gripping as well as fine motor movements.  She states that she is having pain in her left ankle as well.  She states she has chronic pain in her neck and lower back.  She states her lower back pain has been significantly worse and she has left-sided sciatica and numbness.  She states that her knee replacements are doing well denies any warmth or effusion.  She reports that she is on her last dose of doxycycline for a left lower leg ulceration.  She has been using topical ointments and wearing a Band-Aid.  She denies discontinuing methotrexate or Plaquenil while she has had this wound.   Activities of Daily Living:  Patient reports morning stiffness for 2 hours.   Patient Denies nocturnal pain.  Difficulty dressing/grooming: Denies Difficulty climbing stairs: Reports Difficulty getting out of chair: Reports Difficulty using hands for taps, buttons, cutlery, and/or writing: Reports   Review of Systems  Constitutional: Positive for activity change and fatigue.  HENT: Negative for mouth sores, trouble swallowing, trouble swallowing, mouth dryness and nose dryness.   Eyes: Negative for pain, visual disturbance and dryness.  Respiratory: Positive for shortness of breath and  wheezing. Negative for cough, hemoptysis and difficulty breathing.   Cardiovascular: Positive for chest pain and swelling in legs/feet. Negative for palpitations and hypertension.  Gastrointestinal: Positive for diarrhea. Negative for blood in stool and constipation.  Endocrine: Positive for heat intolerance, excessive thirst and increased urination.  Genitourinary: Negative for difficulty urinating and painful urination.  Musculoskeletal: Positive for arthralgias, gait problem, joint pain, joint swelling, muscle weakness, morning stiffness and muscle tenderness. Negative for myalgias and myalgias.  Skin: Negative for color change, pallor, rash, hair loss, nodules/bumps, skin tightness, ulcers and sensitivity to sunlight.  Allergic/Immunologic: Negative for susceptible to infections.  Neurological: Positive for numbness and weakness. Negative for dizziness and headaches.  Hematological: Negative for bruising/bleeding tendency and swollen glands.  Psychiatric/Behavioral: Positive for sleep disturbance. Negative for depressed mood. The patient is not nervous/anxious.     PMFS History:  Patient Active Problem List   Diagnosis Date Noted  . Diarrhea   . Benign neoplasm of cecum   . Abdominal pain, epigastric   . Gastroesophageal reflux disease   . Dependence on nocturnal oxygen therapy 07/10/2017  . Open wound of left lower leg 12/26/2016  . Vitamin D deficiency 09/14/2016  . History of total knee replacement, bilateral 09/14/2016  . DJD (degenerative joint disease), cervical 09/14/2016  . 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 High risk medication use 05/20/2016  . Spinal stenosis, lumbar region, with neurogenic claudication 09/08/2015  Class: Chronic  . OSA (obstructive sleep apnea) 10/17/2013  . Dyspnea 09/04/2013  . Atypical chest pain 09/04/2013  . Morbid obesity ([facility]) 03/21/2012  . Cancer of lower-outer quadrant of female breast ([facility])  03/15/2012  . Diabetes mellitus type II 02/22/2011  . Coronary artery disease   . Hypertension   . Hyperlipidemia   . OA (osteoarthritis) of knee     Past Medical History:  Diagnosis Date  . Asthma    Albuterol inhaler as needed.Pulmicort neb as needed  . Asthma   . Breast cancer ([facility])    right - lumpectomy   . Chronic back pain    spinal stenosis  . Coronary artery disease   . Dependence on nocturnal oxygen therapy 07/10/2017   Pt uses 2 liters 02 at night   . Depression    takes CYmbalta and Wellbutrin daily  . Diabetes mellitus    not on any meds/controlled by diet  . Dyspnea    with exertion occasionally when lies down  . GERD (gastroesophageal reflux disease)    takes Omeprazole daily  . Headache    oocasionally  . History of kidney stones   . Hyperlipidemia    not on any meds  . Hypertension    takes Benazepril,Bystolic,and Amlodipine daily  . IBS (irritable bowel syndrome)   . Joint pain   . Joint swelling   . Nocturia   . OA (osteoarthritis) of knee   . OSA (obstructive sleep apnea)   . Oxygen deficiency    2 liters at night per pt for OSA- no cpap   . Peripheral edema    takes daily as needed  . Peripheral neuropathy   . Personal history of chemotherapy   . Personal history of radiation therapy   . Pneumonia    hx of-several yrs ago  . RA (rheumatoid arthritis) ([facility])   . Sleep apnea    pt states she uses oxygen at night - no cpap- uses 02 2 liters at night   . Urinary frequency   . Urinary urgency   . Weakness    numbness and tingling mainly in left leg occasionally in right.Tingling/numbness in hands    Family History  Problem Relation Age of Onset  . Heart disease Mother   . Cancer Father   . Cancer Brother   . Heart disease Brother    Past Surgical History:  Procedure Laterality Date  . BREAST LUMPECTOMY  1997   RIGHT BREAST  . CARDIAC CATHETERIZATION  04/23/2004   EF 60%  . cataract surgery Bilateral   . COLONOSCOPY    . COLONOSCOPY  WITH PROPOFOL N/A 08/07/2017   Procedure: COLONOSCOPY WITH PROPOFOL;  Surgeon: Ladene Artist, MD;  [facility];  Service: Endoscopy;  Laterality: N/A;  . CORONARY ARTERY BYPASS GRAFT  2005   LIMA GRAFT TO LAD, SAPHENOUS VEIN GRAFT TO THE FIRST DIAGONAL, AND LEFT RADIAL ARTERY GRAFT TO THE OM  . ESOPHAGOGASTRODUODENOSCOPY (EGD) WITH PROPOFOL N/A 08/07/2017   Procedure: ESOPHAGOGASTRODUODENOSCOPY (EGD) WITH PROPOFOL;  Surgeon: Ladene Artist, MD;  [facility];  Service: Endoscopy;  Laterality: N/A;  . FOOT SURGERY Right   . JOINT REPLACEMENT    . LITHOTRIPSY    . LUMBAR LAMINECTOMY/DECOMPRESSION MICRODISCECTOMY N/A 09/08/2015   Procedure: CENTRAL DECOMPRESSIVE LUMBAR LAMINECTOMIES L3-4, L4-5 AND BILATERAL HEMILAMINECTOMY L5-S1;  Surgeon: Jessy Oto, MD;  Location: [facility];  Service: Orthopedics;  Laterality: N/A;  . nodule removed from left elbow    . SHOULDER  ARTHROSCOPY    . TOTAL KNEE ARTHROPLASTY Bilateral   . TOTAL SHOULDER ARTHROPLASTY    . TRIPLE BYPASS  04/27/05  . US ECHOCARDIOGRAPHY  01/06/2009   EF 55-60%  . WRIST SURGERY Left    Social History   Social History Narrative  . Not on file     Objective: Vital Signs: BP (!) 170/80 (BP Location: Left Arm, Patient Position: Sitting, Cuff Size: Normal)   Pulse 71   Resp 18   Ht 5\' 4"  (1.626 m)   Wt 246 lb (111.6 kg)   BMI 42.23 kg/m    Physical Exam  Constitutional: She is oriented to person, place, and time. She appears well-developed and well-nourished.  HENT:  Head: Normocephalic and atraumatic.  Eyes: Conjunctivae and EOM are normal.  Neck: Normal range of motion.  Cardiovascular: Normal rate, regular rhythm, normal heart sounds and intact distal pulses.  Pulmonary/Chest: Effort normal and breath sounds normal.  Abdominal: Soft. Bowel sounds are normal.  Musculoskeletal: She exhibits edema.  Lymphadenopathy:    She has no cervical adenopathy.  Neurological: She is alert and oriented to  person, place, and time.  Skin: Skin is warm and dry. Capillary refill takes less than 2 seconds.  A small ulceration was noted on the left shin.  Psychiatric: She has a normal mood and affect. Her behavior is normal.  Nursing note and vitals reviewed.    Musculoskeletal Exam: C-spine limited range of motion with discomfort.  Mild thoracic kyphosis.  She has midline spinal tenderness in the lumbar region.  She has limited range of motion of the lumbar region.  She has tenderness of bilateral SI joints.  Right shoulder very limited forward flexion and abduction.  Left shoulder with good range of motion.  Elbow joints, wrist joints, MCPs, PIPs, DIPs good range of motion with no synovitis.  She is complete fist formation bilaterally.  She has PIP and DIP synovial thickening consistent with osteoarthritis of bilateral hands.  She has CMC joint synovial thickening bilaterally.  Hip joints, knee joints, ankle joints, MTPs, PIPs, DIPs good range of motion with no synovitis.  No warmth or effusion of bilateral knee joints.  No tenderness of trochanteric bursa.  She has tenderness of left ankle joint with synovial thickening.   CDAI Exam: CDAI Homunculus Exam:   Joint Counts:  CDAI Tender Joint count: 0 CDAI Swollen Joint count: 0  Global Assessments:  Patient Global Assessment: 6 Provider Global Assessment: 4  CDAI Calculated Score: 10    Investigation: No additional findings.PLQ eye exam: 02/16/2017 CBC Latest Ref Rng & Units 07/28/2017 04/24/2017 12/29/2016  WBC 4.0 - 10.5 K/uL 7.4 7.5 6.7  Hemoglobin 12.0 - 15.0 g/dL 13.6 13.0 13.7  Hematocrit 36.0 - 46.0 % 41.0 38.4 41.1  Platelets 150.0 - 400.0 K/uL 209.0 194 176   CMP Latest Ref Rng & Units 07/28/2017 04/24/2017 12/29/2016  Glucose 70 - 99 mg/dL 185(H) 132(H) 146(H)  BUN 6 - 23 mg/dL 13 16 16.3  Creatinine 0.40 - 1.20 mg/dL 0.56 0.56(L) 0.7  Sodium 135 - 145 mEq/L 140 140 142  Potassium 3.5 - 5.1 mEq/L 3.7 3.8 3.7  Chloride 96 - 112 mEq/L  100 102 -  CO2 19 - 32 mEq/L 31 27 29   Calcium 8.4 - 10.5 mg/dL 9.1 9.0 9.9  Total Protein 6.1 - 8.1 g/dL - 6.8 7.2  Total Bilirubin 0.2 - 1.2 mg/dL - 0.4 0.52  Alkaline Phos 40 - 150 U/L - - 61  AST 10 -  35 U/L - 21 23  ALT 6 - 29 U/L - 21 23    Imaging: Dg Chest 2 View  Result Date: 11/27/2017 CLINICAL DATA:  Shortness of breath. EXAM: CHEST - 2 VIEW COMPARISON:  July 22, 2013 FINDINGS: The heart size borderline. The hila and mediastinum are normal. No pneumothorax. No pulmonary nodules or masses. No focal infiltrates. IMPRESSION: No active cardiopulmonary disease. Electronically Signed   By: Dorise Bullion III M.D   On: 11/27/2017 16:41    Speciality Comments: No specialty comments available.    Procedures:  No procedures performed Allergies: Statins    Assessment / Plan:     Visit Diagnoses: Rheumatoid arthritis of multiple sites with negative rheumatoid factor (HCC)-She has no active synovitis.  She has increased joint pain in her bilateral hands.  She has PIP and DIP synovial thickening consistent with osteoarthritis.  She is currently taking Plaquenil 200 mg twice daily Monday through Friday, methotrexate 0.8 mL subcutaneously once a week, and folic acid 2 mg daily.  She is on her last dose of doxycycline for a left lower extremity recurrent  ulceration with drainage.  She did not hold her methotrexate once she was started on the antibiotics.  I had detailed discussion with the patient regarding stopping her methotrexate.  She will not resume methotrexate and consult ulcer is healed completely.  She will increase her Plaquenil to 200 mg p.o. twice daily. CBC and CMP will be drawn today.  She was given a Plaquenil eye exam form.  She has her back and eye exam scheduled for this summer.  High risk medication use - PLQ 200 mg by mouth twice a day Monday through Friday, MTX 0.8 ML subcutaneous every week, folic acid 2 mg by mouth daily. eye exam: 02/16/2017 - Plan: CBC with  Differential/Platelet, COMPLETE METABOLIC PANEL WITH GFR  Ulcer of left lower extremity, unspecified ulcer stage (HCC)-she is currently on oral antibiotics and using some topical agent.  I have advised her to follow-up with her PCP and Dr. due to regarding the ulcer.  History of total knee replacement, bilateral-she is not having much discomfort.  DDD (degenerative disc disease), cervical-she has stiffness with range of motion.  DDD (degenerative disc disease), lumbar-she has some limitation with range of motion.  Spinal stenosis, lumbar region, with neurogenic claudication  History of vitamin D deficiency-she is taking calcium and vitamin D. Other medical problems are listed as follows:  History of coronary artery disease  History of sleep apnea  History of hypertension  History of obesity  History of breast cancer  History of diabetes mellitus    Orders: Orders Placed This Encounter  Procedures  . CBC with Differential/Platelet  . COMPLETE METABOLIC PANEL WITH GFR   No orders of the defined types were placed in this encounter.   Face-to-face time spent with patient was  30 minutes. >50% of time was spent in counseling and coordination of care.  Follow-Up Instructions: Return in about 5 months (around 05/06/2018) for Rheumatoid arthritis, Osteoarthritis.   Bo Merino, MD  Note - This record has been created using Editor, commissioning.  Chart creation errors have been sought, but may not always  have been located. Such creation errors do not reflect on  the standard of medical care.

## 2017-11-27 ENCOUNTER — Ambulatory Visit: Payer: Medicare HMO | Admitting: Pulmonary Disease

## 2017-11-27 ENCOUNTER — Encounter: Payer: Self-pay | Admitting: Pulmonary Disease

## 2017-11-27 ENCOUNTER — Ambulatory Visit (INDEPENDENT_AMBULATORY_CARE_PROVIDER_SITE_OTHER)
Admission: RE | Admit: 2017-11-27 | Discharge: 2017-11-27 | Disposition: A | Discharge status: Home / Self Care | Payer: Medicare HMO | Source: Ambulatory Visit | Attending: Pulmonary Disease | Admitting: Pulmonary Disease

## 2017-11-27 DIAGNOSIS — G4733 Obstructive sleep apnea (adult) (pediatric): Secondary | ICD-10-CM

## 2017-11-27 DIAGNOSIS — R0600 Dyspnea, unspecified: Secondary | ICD-10-CM

## 2017-11-27 DIAGNOSIS — R0602 Shortness of breath: Secondary | ICD-10-CM | POA: Diagnosis not present

## 2017-11-27 NOTE — Assessment & Plan Note (Signed)
Because of dyspnea on routine activities is not clear to me.  This does not appear to be related to asthma.  We will obtain chest x-ray but she does not seem to have rheumatoid lung disease.  There is no evidence of pulmonary hypertension.  No obvious risk factors for pulmonary embolism other than her limited mobility.  She is clearly deconditioned but limited her ability to participate in rehab due to her back issues and this may be the main problem here

## 2017-11-27 NOTE — Patient Instructions (Addendum)
Lung function appears okay, no evidence of asthma Chest x-ray today. If above normal, then discussed with Dr. Haynes Kerns about changing benazepril to another medication

## 2017-11-27 NOTE — Progress Notes (Signed)
   Subjective:    Patient ID: [name], female    DOB: 05-18-1946, 72 y.o.   MRN: [id number]  HPI    Review of Systems  Constitutional: Negative for fever and unexpected weight change.  HENT: Negative for congestion, dental problem, ear pain, nosebleeds, postnasal drip, rhinorrhea, sinus pressure, sneezing, sore throat and trouble swallowing.   Eyes: Negative for redness and itching.  Respiratory: Positive for shortness of breath. Negative for cough, chest tightness and wheezing.   Cardiovascular: Negative for palpitations and leg swelling.  Gastrointestinal: Negative for nausea and vomiting.  Genitourinary: Negative for dysuria.  Musculoskeletal: Positive for joint swelling.  Skin: Negative for rash.  Allergic/Immunologic: Negative.  Negative for environmental allergies, food allergies and immunocompromised state.  Neurological: Negative for headaches.  Hematological: Does not bruise/bleed easily.  Psychiatric/Behavioral: Negative for dysphoric mood. The patient is not nervous/anxious.        Objective:   Physical Exam        Assessment & Plan:

## 2017-11-27 NOTE — Assessment & Plan Note (Signed)
She is hesitant to use CPAP and would like to think about this some more. I was able to talk to her right nasal pillows and she may consider trial of CPAP therapy and will call us back if she is interested

## 2017-11-27 NOTE — Progress Notes (Signed)
Subjective:    Patient ID: [name], female    DOB: 08/04/1945, 72 y.o.   MRN: [id number]  HPI 72 year old never smoker with asthma presents for evaluation of dyspnea. She was evaluated 4 years ago for similar problem and no clear cause was obtained.  Since then she reports increasing dyspnea including on routine activities.  She lives with her daughter and reported dyspnea on making her bed or going out with her friends. She had back surgery that was complicated with a dural tear and ambulates with a cane for the last 2 years. She was diagnosed with OSA 2 years ago by a home sleep study but did not want to get on CPAP therapy.She was placed on oxygen at night by her PCP and feels that this is helped her somewhat  She reports onset of asthma since her 5s .  Triggers include weather changes and allergies.  She has not needed prednisone in a long time.  She is maintained on Pulmicort nebs and has albuterol for her rescue inhaler.  She denies hospitalizations or ER visits.  Asthma presents with wheezing  She underwent recent evaluation by cardiology and was felt not to have any cardiac causes of dyspnea  She has rheumatoid arthritis and is maintained on weekly methotrexate injections -20 mg .  She has a history of rheumatoid nodules She underwent bilateral knee replacements in 2014.  She is status post CABG in 2005. Nuclear stress test in December of 2011 was normal. Echo -suggested diastolic dysfunction with elevated EDP. Pulmonary artery pressure was normal.   Significant tests/ events reviewed  Spirometry 11/2017 ratio 82, FEV1 67%, FVC 62%  09/2013 Spirometry showed moderate restriction with a ratio 77, FEV 1.45-62% and FVC 1.87-62%. There is no evidence of airway obstruction  11/2013 HST - AHI 25/h  Past Medical History:  Diagnosis Date  . Asthma    Albuterol inhaler as needed.Pulmicort neb as needed  . Asthma   . Breast cancer ([place])    right - lumpectomy   . Chronic back  pain    spinal stenosis  . Coronary artery disease   . Dependence on nocturnal oxygen therapy 07/10/2017   Pt uses 2 liters 02 at night   . Depression    takes CYmbalta and Wellbutrin daily  . Diabetes mellitus    not on any meds/controlled by diet  . Dyspnea    with exertion occasionally when lies down  . GERD (gastroesophageal reflux disease)    takes Omeprazole daily  . Headache    oocasionally  . History of kidney stones   . Hyperlipidemia    not on any meds  . Hypertension    takes Benazepril,Bystolic,and Amlodipine daily  . IBS (irritable bowel syndrome)   . Joint pain   . Joint swelling   . Nocturia   . OA (osteoarthritis) of knee   . OSA (obstructive sleep apnea)   . Oxygen deficiency    2 liters at night per pt for OSA- no cpap   . Peripheral edema    takes daily as needed  . Peripheral neuropathy   . Personal history of chemotherapy   . Personal history of radiation therapy   . Pneumonia    hx of-several yrs ago  . RA (rheumatoid arthritis) ([place])   . Sleep apnea    pt states she uses oxygen at night - no cpap- uses 02 2 liters at night   . Urinary frequency   . Urinary urgency   .  Weakness    numbness and tingling mainly in left leg occasionally in right.Tingling/numbness in hands     Past Surgical History:  Procedure Laterality Date  . BREAST LUMPECTOMY  1997   RIGHT BREAST  . CARDIAC CATHETERIZATION  04/23/2004   EF 60%  . cataract surgery Bilateral   . COLONOSCOPY    . COLONOSCOPY WITH PROPOFOL N/A 08/07/2017   Procedure: COLONOSCOPY WITH PROPOFOL;  Surgeon: Ladene Artist, MD;  Location: [facility];  Service: Endoscopy;  Laterality: N/A;  . CORONARY ARTERY BYPASS GRAFT  2005   LIMA GRAFT TO LAD, SAPHENOUS VEIN GRAFT TO THE FIRST DIAGONAL, AND LEFT RADIAL ARTERY GRAFT TO THE OM  . ESOPHAGOGASTRODUODENOSCOPY (EGD) WITH PROPOFOL N/A 08/07/2017   Procedure: ESOPHAGOGASTRODUODENOSCOPY (EGD) WITH PROPOFOL;  Surgeon: Ladene Artist, MD;  Location:  [facility];  Service: Endoscopy;  Laterality: N/A;  . FOOT SURGERY Right   . JOINT REPLACEMENT    . LITHOTRIPSY    . LUMBAR LAMINECTOMY/DECOMPRESSION MICRODISCECTOMY N/A 09/08/2015   Procedure: CENTRAL DECOMPRESSIVE LUMBAR LAMINECTOMIES L3-4, L4-5 AND BILATERAL HEMILAMINECTOMY L5-S1;  Surgeon: Jessy Oto, MD;  Location: [facility];  Service: Orthopedics;  Laterality: N/A;  . nodule removed from left elbow    . SHOULDER ARTHROSCOPY    . TOTAL KNEE ARTHROPLASTY Bilateral   . TRIPLE BYPASS  04/27/05  . US ECHOCARDIOGRAPHY  01/06/2009   EF 55-60%  . WRIST SURGERY Left     Allergies  Allergen Reactions  . Statins Other (See Comments)    Joint pain and swelling/burning     Social History   Socioeconomic History  . Marital status: Widowed    Spouse name: Not on file  . Number of children: 2  . Years of education: Not on file  . Highest education level: Not on file  Occupational History  . Occupation: retired    Fish farm manager: UNEMPLOYED  Social Needs  . Financial resource strain: Not on file  . Food insecurity:    Worry: Not on file    Inability: Not on file  . Transportation needs:    Medical: Not on file    Non-medical: Not on file  Tobacco Use  . Smoking status: Never Smoker  . Smokeless tobacco: Never Used  Substance and Sexual Activity  . Alcohol use: No    Alcohol/week: 0.0 oz  . Drug use: No  . Sexual activity: Not on file  Lifestyle  . Physical activity:    Days per week: Not on file    Minutes per session: Not on file  . Stress: Not on file  Relationships  . Social connections:    Talks on phone: Not on file    Gets together: Not on file    Attends religious service: Not on file    Active member of club or organization: Not on file    Attends meetings of clubs or organizations: Not on file    Relationship status: Not on file  . Intimate partner violence:    Fear of current or ex partner: Not on file    Emotionally abused: Not on file    Physically abused:  Not on file    Forced sexual activity: Not on file  Other Topics Concern  . Not on file  Social History Narrative  . Not on file     Family History  Problem Relation Age of Onset  . Heart disease Mother   . Cancer Father   . Cancer Brother   . Heart disease Brother  Review of Systems Constitutional: negative for anorexia, fevers and sweats  Eyes: negative for irritation, redness and visual disturbance  Ears, nose, mouth, throat, and face: negative for earaches, epistaxis, nasal congestion and sore throat  Respiratory: negative for cough, sputum and wheezing  Cardiovascular: negative for chest pain,lower extremity edema, orthopnea, palpitations and syncope  Gastrointestinal: negative for abdominal pain, constipation, diarrhea, melena, nausea and vomiting  Genitourinary:negative for dysuria, frequency and hematuria  Hematologic/lymphatic: negative for bleeding, easy bruising and lymphadenopathy  Musculoskeletal:negative for arthralgias, muscle weakness and stiff joints  Neurological: negative for coordination problems, gait problems, headaches and weakness  Endocrine: negative for diabetic symptoms including polydipsia, polyuria and weight loss     Objective:   Physical Exam Gen. Pleasant, obese, in no distress, normal affect ENT - no post nasal drip, class 2-3 airway Neck: No JVD, no thyromegaly, no carotid bruits Lungs: no use of accessory muscles, no dullness to percussion, decreased without rales or rhonchi  Cardiovascular: Rhythm regular, heart sounds  normal, no murmurs or gallops, no peripheral edema Abdomen: soft and non-tender, no hepatosplenomegaly, BS normal. Musculoskeletal: No deformities, no cyanosis or clubbing, ambulates with cane Neuro:  alert, non focal, no tremors        Assessment & Plan:

## 2017-11-29 DIAGNOSIS — I10 Essential (primary) hypertension: Secondary | ICD-10-CM | POA: Diagnosis not present

## 2017-11-29 DIAGNOSIS — Z6841 Body Mass Index (BMI) 40.0 and over, adult: Secondary | ICD-10-CM | POA: Diagnosis not present

## 2017-12-04 ENCOUNTER — Encounter: Payer: Self-pay | Admitting: Rheumatology

## 2017-12-04 ENCOUNTER — Ambulatory Visit: Payer: Medicare HMO | Admitting: Rheumatology

## 2017-12-04 VITALS — BP 170/80 | HR 71 | Resp 18 | Ht 64.0 in | Wt 246.0 lb

## 2017-12-04 DIAGNOSIS — Z96653 Presence of artificial knee joint, bilateral: Secondary | ICD-10-CM

## 2017-12-04 DIAGNOSIS — Z8669 Personal history of other diseases of the nervous system and sense organs: Secondary | ICD-10-CM

## 2017-12-04 DIAGNOSIS — M48062 Spinal stenosis, lumbar region with neurogenic claudication: Secondary | ICD-10-CM

## 2017-12-04 DIAGNOSIS — L97929 Non-pressure chronic ulcer of unspecified part of left lower leg with unspecified severity: Secondary | ICD-10-CM

## 2017-12-04 DIAGNOSIS — M5030 Other cervical disc degeneration, unspecified cervical region: Secondary | ICD-10-CM

## 2017-12-04 DIAGNOSIS — M0609 Rheumatoid arthritis without rheumatoid factor, multiple sites: Secondary | ICD-10-CM | POA: Diagnosis not present

## 2017-12-04 DIAGNOSIS — Z79899 Other long term (current) drug therapy: Secondary | ICD-10-CM | POA: Diagnosis not present

## 2017-12-04 DIAGNOSIS — Z8679 Personal history of other diseases of the circulatory system: Secondary | ICD-10-CM | POA: Diagnosis not present

## 2017-12-04 DIAGNOSIS — Z853 Personal history of malignant neoplasm of breast: Secondary | ICD-10-CM | POA: Diagnosis not present

## 2017-12-04 DIAGNOSIS — M5136 Other intervertebral disc degeneration, lumbar region: Secondary | ICD-10-CM | POA: Diagnosis not present

## 2017-12-04 DIAGNOSIS — Z8639 Personal history of other endocrine, nutritional and metabolic disease: Secondary | ICD-10-CM

## 2017-12-04 DIAGNOSIS — M51369 Other intervertebral disc degeneration, lumbar region without mention of lumbar back pain or lower extremity pain: Secondary | ICD-10-CM

## 2017-12-04 LAB — CBC WITH DIFFERENTIAL/PLATELET
BASOS ABS: 39 {cells}/uL (ref 0–200)
Basophils Relative: 0.5 %
EOS ABS: 172 {cells}/uL (ref 15–500)
Eosinophils Relative: 2.2 %
HCT: 40.6 % (ref 35.0–45.0)
HEMOGLOBIN: 14 g/dL (ref 11.7–15.5)
Lymphs Abs: 2356 cells/uL (ref 850–3900)
MCH: 30.4 pg (ref 27.0–33.0)
MCHC: 34.5 g/dL (ref 32.0–36.0)
MCV: 88.1 fL (ref 80.0–100.0)
MONOS PCT: 10.2 %
MPV: 11.2 fL (ref 7.5–12.5)
NEUTROS ABS: 4438 {cells}/uL (ref 1500–7800)
Neutrophils Relative %: 56.9 %
Platelets: 186 10*3/uL (ref 140–400)
RBC: 4.61 10*6/uL (ref 3.80–5.10)
RDW: 13.4 % (ref 11.0–15.0)
Total Lymphocyte: 30.2 %
WBC mixed population: 796 cells/uL (ref 200–950)
WBC: 7.8 10*3/uL (ref 3.8–10.8)

## 2017-12-04 LAB — COMPLETE METABOLIC PANEL WITH GFR
AG Ratio: 1.9 (calc) (ref 1.0–2.5)
ALBUMIN MSPROF: 4.5 g/dL (ref 3.6–5.1)
ALT: 27 U/L (ref 6–29)
AST: 27 U/L (ref 10–35)
Alkaline phosphatase (APISO): 62 U/L (ref 33–130)
BILIRUBIN TOTAL: 0.6 mg/dL (ref 0.2–1.2)
BUN / CREAT RATIO: 28 (calc) — AB (ref 6–22)
BUN: 14 mg/dL (ref 7–25)
CHLORIDE: 100 mmol/L (ref 98–110)
CO2: 32 mmol/L (ref 20–32)
CREATININE: 0.5 mg/dL — AB (ref 0.60–0.93)
Calcium: 9.5 mg/dL (ref 8.6–10.4)
GFR, Est African American: 112 mL/min/{1.73_m2} (ref >=60)
GFR, Est Non African American: 97 mL/min/{1.73_m2} (ref >=60)
GLUCOSE: 183 mg/dL — AB (ref 65–99)
Globulin: 2.4 g/dL (calc) (ref 1.9–3.7)
Potassium: 3.9 mmol/L (ref 3.5–5.3)
Sodium: 141 mmol/L (ref 135–146)
Total Protein: 6.9 g/dL (ref 6.1–8.1)

## 2017-12-04 MED ORDER — HYDROXYCHLOROQUINE SULFATE 200 MG PO TABS: 200.0000 mg | ORAL_TABLET | Freq: Two times a day (BID) | ORAL | 0 refills | Status: DC

## 2017-12-04 NOTE — Patient Instructions (Signed)
Standing Labs We placed an order today for your standing lab work.    Please come back and get your standing labs in August and every 3 months   We have open lab Monday through Friday from 8:30-11:30 AM and 1:30-4:00 PM  at the office of Dr. Jamica Woodyard.   You may experience shorter wait times on Monday and Friday afternoons. The office is located at [address], [address], [place], [place] [address] No appointment is necessary.   Labs are drawn by [facility].  You may receive a bill from [facility] for your lab work. If you have any questions regarding directions or hours of operation,  please call [phone].    

## 2017-12-05 NOTE — Progress Notes (Signed)
Glucose is very elevated. Please notify patient.  All other lab values are WNL.

## 2017-12-25 DIAGNOSIS — I2510 Atherosclerotic heart disease of native coronary artery without angina pectoris: Secondary | ICD-10-CM | POA: Diagnosis not present

## 2017-12-25 DIAGNOSIS — R2242 Localized swelling, mass and lump, left lower limb: Secondary | ICD-10-CM | POA: Diagnosis not present

## 2017-12-25 DIAGNOSIS — I10 Essential (primary) hypertension: Secondary | ICD-10-CM | POA: Diagnosis not present

## 2017-12-25 DIAGNOSIS — F3289 Other specified depressive episodes: Secondary | ICD-10-CM | POA: Diagnosis not present

## 2017-12-25 DIAGNOSIS — E1151 Type 2 diabetes mellitus with diabetic peripheral angiopathy without gangrene: Secondary | ICD-10-CM | POA: Diagnosis not present

## 2017-12-25 DIAGNOSIS — M069 Rheumatoid arthritis, unspecified: Secondary | ICD-10-CM | POA: Diagnosis not present

## 2017-12-25 DIAGNOSIS — Z6841 Body Mass Index (BMI) 40.0 and over, adult: Secondary | ICD-10-CM | POA: Diagnosis not present

## 2018-01-03 ENCOUNTER — Other Ambulatory Visit: Payer: Self-pay | Admitting: *Deleted

## 2018-01-03 DIAGNOSIS — C50519 Malignant neoplasm of lower-outer quadrant of unspecified female breast: Secondary | ICD-10-CM

## 2018-01-04 ENCOUNTER — Inpatient Hospital Stay: Payer: Medicare HMO | Attending: Hematology & Oncology

## 2018-01-04 ENCOUNTER — Inpatient Hospital Stay (HOSPITAL_BASED_OUTPATIENT_CLINIC_OR_DEPARTMENT_OTHER): Payer: Medicare HMO | Admitting: Hematology & Oncology

## 2018-01-04 ENCOUNTER — Encounter: Payer: Self-pay | Admitting: Hematology & Oncology

## 2018-01-04 ENCOUNTER — Other Ambulatory Visit: Payer: Self-pay

## 2018-01-04 VITALS — BP 172/86 | HR 78 | Temp 98.7°F | Resp 18 | Wt 247.0 lb

## 2018-01-04 DIAGNOSIS — C50519 Malignant neoplasm of lower-outer quadrant of unspecified female breast: Secondary | ICD-10-CM

## 2018-01-04 DIAGNOSIS — M48062 Spinal stenosis, lumbar region with neurogenic claudication: Secondary | ICD-10-CM

## 2018-01-04 DIAGNOSIS — M069 Rheumatoid arthritis, unspecified: Secondary | ICD-10-CM

## 2018-01-04 DIAGNOSIS — Z853 Personal history of malignant neoplasm of breast: Secondary | ICD-10-CM | POA: Diagnosis not present

## 2018-01-04 LAB — CBC WITH DIFFERENTIAL (CANCER CENTER ONLY)
BASOS ABS: 0 10*3/uL (ref 0.0–0.1)
Basophils Relative: 0 %
Eosinophils Absolute: 0.1 10*3/uL (ref 0.0–0.5)
Eosinophils Relative: 2 %
HCT: 41.3 % (ref 34.8–46.6)
Hemoglobin: 13.7 g/dL (ref 11.6–15.9)
LYMPHS PCT: 28 %
Lymphs Abs: 2.1 10*3/uL (ref 0.9–3.3)
MCH: 30.5 pg (ref 26.0–34.0)
MCHC: 33.2 g/dL (ref 32.0–36.0)
MCV: 92 fL (ref 81.0–101.0)
MONO ABS: 0.7 10*3/uL (ref 0.1–0.9)
Monocytes Relative: 10 %
NEUTROS ABS: 4.4 10*3/uL (ref 1.5–6.5)
Neutrophils Relative %: 60 %
Platelet Count: 160 10*3/uL (ref 145–400)
RBC: 4.49 MIL/uL (ref 3.70–5.32)
RDW: 13.9 % (ref 11.1–15.7)
WBC Count: 7.4 10*3/uL (ref 3.9–10.0)

## 2018-01-04 LAB — CMP (CANCER CENTER ONLY)
ALK PHOS: 63 U/L (ref 40–150)
ALT: 31 U/L (ref 0–55)
AST: 27 U/L (ref 5–34)
Albumin: 4.3 g/dL (ref 3.5–5.0)
Anion gap: 10 (ref 3–11)
BILIRUBIN TOTAL: 0.4 mg/dL (ref 0.2–1.2)
BUN: 14 mg/dL (ref 7–26)
CALCIUM: 9.5 mg/dL (ref 8.4–10.4)
CO2: 28 mmol/L (ref 22–29)
CREATININE: 0.7 mg/dL (ref 0.60–1.10)
Chloride: 101 mmol/L (ref 98–109)
GFR, Est AFR Am: >60 mL/min (ref >60)
GLUCOSE: 182 mg/dL — AB (ref 70–140)
Potassium: 3.7 mmol/L (ref 3.5–5.1)
Sodium: 139 mmol/L (ref 136–145)
TOTAL PROTEIN: 7.4 g/dL (ref 6.4–8.3)

## 2018-01-04 NOTE — Progress Notes (Signed)
Hematology and Oncology Follow Up Visit  [name] [id number] 10/06/1945 72 y.o. 01/04/2018   Principle Diagnosis:  Stage  I (T1N0M0) ductal carcinoma the right breast   Current Therapy:    Observation     Interim History:  [name] is back for follow-up. We see her yearly. She has had no specific complaints.  She is having problems with her right shoulder.  She had surgery for this a while back.  I guess that there was not much as to good healing.  She saw a Restaurant manager, fast food.  He unfortunately was not able to help with her shoulder.  She is on methotrexate for rheumatoid arthritis.    Overall, her performance status is ECOG 1.  Medications:  Current Outpatient Medications:  .  acetaminophen (TYLENOL) 500 MG tablet, Take 1,000 mg by mouth 2 (two) times daily as needed for moderate pain., Disp: , Rfl:  .  albuterol (PROVENTIL HFA;VENTOLIN HFA) 108 (90 BASE) MCG/ACT inhaler, Inhale 2 puffs into the lungs every 4 (four) hours as needed for wheezing or shortness of breath (cough, shortness of breath or wheezing.)., Disp: 1 Inhaler, Rfl: 12 .  amLODipine (NORVASC) 5 MG tablet, Take 5 mg by mouth Daily. , Disp: , Rfl:  .  aspirin 81 MG tablet, Take 81 mg by mouth at bedtime. , Disp: , Rfl:  .  b complex vitamins tablet, Take 1 tablet by mouth daily. , Disp: , Rfl:  .  B-D ALLERGY SYRINGE 1CC/28G 28G X 1/2" 1 ML MISC, USE TO INJECT METHOTREXATE ONCE WEEKLY, Disp: 12 each, Rfl: 4 .  baclofen (LIORESAL) 10 MG tablet, TAKE 1 TABLET BY MOUTH TWICE A DAY AS NEEDED FOR SPASMS, Disp: 180 tablet, Rfl: 0 .  budesonide (PULMICORT) 0.5 MG/2ML nebulizer solution, Take 0.5 mg by nebulization 2 (two) times daily., Disp: , Rfl:  .  buPROPion (WELLBUTRIN XL) 150 MG 24 hr tablet, Take 150 mg by mouth daily. , Disp: , Rfl:  .  busPIRone (BUSPAR) 7.5 MG tablet, Take 7.5 mg by mouth daily. May take a second dose of 7.5 mg as needed for anxiety, Disp: , Rfl:  .  Cholecalciferol (VITAMIN D3) 5000 units CAPS,  Take 5,000 Units by mouth daily., Disp: , Rfl:  .  collagenase (SANTYL) ointment, Apply 1 application topically daily. (Patient taking differently: Apply 1 application topically daily as needed (skin sores). ), Disp: 30 g, Rfl: 0 .  [employer] ALCOHOL SWABS PADS, WIPE AREA WITH ALCOHOL PAD PRIOR TO GIVING METHOTREXATE INJECTION, Disp: 100 each, Rfl: 0 .  doxycycline (MONODOX) 100 MG capsule, Take 100 mg by mouth 2 (two) times daily., Disp: , Rfl: 0 .  DULoxetine (CYMBALTA) 60 MG capsule, Take 60 mg by mouth daily. , Disp: , Rfl:  .  folic acid (FOLVITE) 1 MG tablet, Take 2 tablets (2 mg total) by mouth daily. (Patient taking differently: Take 1 mg by mouth 2 (two) times daily. ), Disp: 180 tablet, Rfl: 4 .  gabapentin (NEURONTIN) 300 MG capsule, Take 600 mg by mouth 3 (three) times daily. , Disp: , Rfl:  .  galantamine (RAZADYNE ER) 8 MG 24 hr capsule, TAKE ONE EACH EVERY MORNING WITH FOOD (SHE FAILED ARICEPT DUE TO SIDE EFFECTS), Disp: , Rfl: 11 .  hydrochlorothiazide 25 MG tablet, Take 25 mg by mouth daily.  , Disp: , Rfl:  .  hydroxychloroquine (PLAQUENIL) 200 MG tablet, Take 1 tablet (200 mg total) by mouth 2 (two) times daily., Disp: 180 tablet, Rfl: 0 .  loperamide (IMODIUM) 2 MG capsule, Take 2 mg by mouth as needed for diarrhea or loose stools., Disp: , Rfl:  .  loratadine (CLARITIN) 10 MG tablet, Take 10 mg by mouth daily., Disp: , Rfl:  .  meloxicam (MOBIC) 15 MG tablet, Take 15 mg by mouth daily.  , Disp: , Rfl:  .  Menthol, Topical Analgesic, (BIOFREEZE EX), Apply 1 application topically 2 (two) times daily., Disp: , Rfl:  .  methotrexate 50 MG/2ML injection, INJECT 0.8 MILLILITER SUBCUTANEOUSLY EVERY WEEK, Disp: 10 mL, Rfl: 0 .  montelukast (SINGULAIR) 10 MG tablet, Take 10 mg by mouth daily., Disp: , Rfl: 11 .  Multiple Vitamin (MULTIVITAMIN) tablet, Take 1 tablet by mouth daily.  , Disp: , Rfl:  .  Multiple Vitamins-Minerals (HAIR/SKIN/NAILS) TABS, Take 1 tablet by mouth daily., Disp: , Rfl:   .  nebivolol (BYSTOLIC) 10 MG tablet, Take 10 mg by mouth daily.  , Disp: , Rfl:  .  omeprazole (PRILOSEC) 40 MG capsule, Take 1 capsule (40 mg total) by mouth 2 (two) times daily., Disp: 60 capsule, Rfl: 3 .  OVER THE COUNTER MEDICATION, Take 1 tablet by mouth 2 (two) times daily. Cherry Flex otc supplement, Disp: , Rfl:  .  OXYGEN, Inhale 2 L into the lungs at bedtime as needed., Disp: , Rfl:  .  Potassium 99 MG TABS, Take 99 mg by mouth 2 (two) times daily., Disp: , Rfl:  .  Probiotic CAPS, Take 1 capsule by mouth daily., Disp: , Rfl:  .  vitamin B-12 (CYANOCOBALAMIN) 1000 MCG tablet, Take 1,000 mcg by mouth daily., Disp: , Rfl:  .  vitamin E 400 UNIT capsule, Take 400 Units by mouth daily., Disp: , Rfl:   Allergies:  Allergies  Allergen Reactions  . Statins Other (See Comments)    Joint pain and swelling/burning    Past Medical History, Surgical history, Social history, and Family History were reviewed and updated.  Review of Systems: Review of Systems  Constitutional: Negative.   HENT: Negative.   Eyes: Negative.   Respiratory: Negative.   Cardiovascular: Negative.   Gastrointestinal: Negative.   Genitourinary: Negative.   Musculoskeletal: Negative.   Skin: Negative.   Neurological: Negative.   Endo/Heme/Allergies: Negative.   Psychiatric/Behavioral: Negative.      Physical Exam:  weight is 247 lb (112 kg). Her oral temperature is 98.7 F (37.1 C). Her blood pressure is 172/86 (abnormal) and her pulse is 78. Her respiration is 18 and oxygen saturation is 97%.   Wt Readings from Last 3 Encounters:  01/04/18 247 lb (112 kg)  12/04/17 246 lb (111.6 kg)  11/27/17 246 lb 6.4 oz (111.8 kg)     Physical Exam  Constitutional: She is oriented to person, place, and time.  HENT:  Head: Normocephalic and atraumatic.  Mouth/Throat: Oropharynx is clear and moist.  Eyes: Pupils are equal, round, and reactive to light. EOM are normal.  Neck: Normal range of motion.    Cardiovascular: Normal rate, regular rhythm and normal heart sounds.  Pulmonary/Chest: Effort normal and breath sounds normal.  Abdominal: Soft. Bowel sounds are normal.  Musculoskeletal: Normal range of motion. She exhibits no edema, tenderness or deformity.  Lymphadenopathy:    She has no cervical adenopathy.  Neurological: She is alert and oriented to person, place, and time.  Skin: Skin is warm and dry. No rash noted. No erythema.  Psychiatric: She has a normal mood and affect. Her behavior is normal. Judgment and thought content normal.  Vitals  reviewed.    Lab Results  Component Value Date   WBC 7.4 01/04/2018   HGB 13.7 01/04/2018   HCT 41.3 01/04/2018   MCV 92.0 01/04/2018   PLT 160 01/04/2018     Chemistry      Component Value Date/Time   NA 141 12/04/2017 1059   NA 142 12/29/2016 1140   K 3.9 12/04/2017 1059   K 3.7 12/29/2016 1140   CL 100 12/04/2017 1059   CO2 32 12/04/2017 1059   CO2 29 12/29/2016 1140   BUN 14 12/04/2017 1059   BUN 16.3 12/29/2016 1140   CREATININE 0.50 (L) 12/04/2017 1059   CREATININE 0.7 12/29/2016 1140      Component Value Date/Time   CALCIUM 9.5 12/04/2017 1059   CALCIUM 9.9 12/29/2016 1140   ALKPHOS 61 12/29/2016 1140   AST 27 12/04/2017 1059   AST 23 12/29/2016 1140   ALT 27 12/04/2017 1059   ALT 23 12/29/2016 1140   BILITOT 0.6 12/04/2017 1059   BILITOT 0.52 12/29/2016 1140         Impression and Plan: Ms. Umble is 72 year old white female with a past history of infiltrating ductal carcinoma the right breast. She has stage I disease. She now is out from surgery and treatment by 20 years.  At this point, I think that her chance of recurrence is good to be less than 5%.  I think that we can probably let her go from the clinic.  As nice as she is and as much fun as we have seen her, I just do not want to waste her time if we really are not making much of an impact on her health care.  So for the practice   Volanda Napoleon, MD 6/13/201911:43 AM

## 2018-01-05 DIAGNOSIS — Z6841 Body Mass Index (BMI) 40.0 and over, adult: Secondary | ICD-10-CM | POA: Diagnosis not present

## 2018-01-05 DIAGNOSIS — I87333 Chronic venous hypertension (idiopathic) with ulcer and inflammation of bilateral lower extremity: Secondary | ICD-10-CM | POA: Diagnosis not present

## 2018-01-05 DIAGNOSIS — S81802A Unspecified open wound, left lower leg, initial encounter: Secondary | ICD-10-CM | POA: Diagnosis not present

## 2018-01-05 LAB — VITAMIN D 25 HYDROXY (VIT D DEFICIENCY, FRACTURES): VIT D 25 HYDROXY: 50.3 ng/mL (ref 30.0–100.0)

## 2018-01-17 DIAGNOSIS — I87333 Chronic venous hypertension (idiopathic) with ulcer and inflammation of bilateral lower extremity: Secondary | ICD-10-CM | POA: Diagnosis not present

## 2018-01-17 DIAGNOSIS — S81802D Unspecified open wound, left lower leg, subsequent encounter: Secondary | ICD-10-CM | POA: Diagnosis not present

## 2018-01-17 DIAGNOSIS — Z6841 Body Mass Index (BMI) 40.0 and over, adult: Secondary | ICD-10-CM | POA: Diagnosis not present

## 2018-01-24 DIAGNOSIS — N3281 Overactive bladder: Secondary | ICD-10-CM | POA: Diagnosis not present

## 2018-01-24 DIAGNOSIS — Z853 Personal history of malignant neoplasm of breast: Secondary | ICD-10-CM | POA: Diagnosis not present

## 2018-01-24 DIAGNOSIS — N950 Postmenopausal bleeding: Secondary | ICD-10-CM | POA: Diagnosis not present

## 2018-01-24 DIAGNOSIS — Z801 Family history of malignant neoplasm of trachea, bronchus and lung: Secondary | ICD-10-CM | POA: Diagnosis not present

## 2018-01-24 DIAGNOSIS — Z803 Family history of malignant neoplasm of breast: Secondary | ICD-10-CM | POA: Diagnosis not present

## 2018-01-24 DIAGNOSIS — Z01419 Encounter for gynecological examination (general) (routine) without abnormal findings: Secondary | ICD-10-CM | POA: Diagnosis not present

## 2018-01-24 DIAGNOSIS — Z86010 Personal history of colonic polyps: Secondary | ICD-10-CM | POA: Diagnosis not present

## 2018-02-15 ENCOUNTER — Telehealth (INDEPENDENT_AMBULATORY_CARE_PROVIDER_SITE_OTHER): Payer: Self-pay | Admitting: Orthopaedic Surgery

## 2018-02-15 NOTE — Telephone Encounter (Signed)
PLEASE ADVISE.

## 2018-02-15 NOTE — Telephone Encounter (Signed)
Patient called stating she had right shoulder surgery in September and states she has pain down to her elbow.  Patient is requesting a return call to discuss if she is a candidate to get a cortisone shot.

## 2018-02-15 NOTE — Telephone Encounter (Signed)
Needs return to office  to evaluate  need for cortisone shot

## 2018-02-16 NOTE — Telephone Encounter (Signed)
PLEASE SCHEDULE FOR APPT.

## 2018-02-20 ENCOUNTER — Encounter (INDEPENDENT_AMBULATORY_CARE_PROVIDER_SITE_OTHER): Payer: Self-pay | Admitting: Orthopedic Surgery

## 2018-02-20 ENCOUNTER — Ambulatory Visit (INDEPENDENT_AMBULATORY_CARE_PROVIDER_SITE_OTHER): Payer: Medicare HMO | Admitting: Orthopedic Surgery

## 2018-02-20 ENCOUNTER — Ambulatory Visit (INDEPENDENT_AMBULATORY_CARE_PROVIDER_SITE_OTHER): Payer: Medicare HMO

## 2018-02-20 VITALS — BP 161/92 | HR 78 | Ht 64.0 in | Wt 246.0 lb

## 2018-02-20 DIAGNOSIS — M75101 Unspecified rotator cuff tear or rupture of right shoulder, not specified as traumatic: Secondary | ICD-10-CM | POA: Diagnosis not present

## 2018-02-20 DIAGNOSIS — M19011 Primary osteoarthritis, right shoulder: Secondary | ICD-10-CM

## 2018-02-20 DIAGNOSIS — M12811 Other specific arthropathies, not elsewhere classified, right shoulder: Secondary | ICD-10-CM | POA: Diagnosis not present

## 2018-02-20 DIAGNOSIS — M25311 Other instability, right shoulder: Secondary | ICD-10-CM

## 2018-02-20 MED ORDER — LIDOCAINE HCL 2 % IJ SOLN
2.0000 mL | INTRAMUSCULAR | Status: AC | PRN
  Administered 2018-02-20: 2 mL

## 2018-02-20 MED ORDER — METHYLPREDNISOLONE ACETATE 40 MG/ML IJ SUSP
80.0000 mg | INTRAMUSCULAR | Status: AC | PRN
  Administered 2018-02-20: 80 mg

## 2018-02-20 MED ORDER — BUPIVACAINE HCL 0.5 % IJ SOLN
2.0000 mL | INTRAMUSCULAR | Status: AC | PRN
  Administered 2018-02-20: 2 mL via INTRA_ARTICULAR

## 2018-02-20 NOTE — Progress Notes (Signed)
Office Visit Note   Patient: [name]           Date of Birth: 24-Oct-1945           MRN: [id number] Visit Date: 02/20/2018              Requested by: Crist Infante, MD [address] [place], [place] [address] PCP: Crist Infante, MD   Assessment & Plan: Visit Diagnoses:  1. Primary osteoarthritis, right shoulder   2. Rotator cuff tear arthropathy, right   3. Rotator cuff insufficiency of right shoulder     Plan: Corticosteroid injection to the right shoulder was performed without difficulty.  She did have improvement in her motion and pain.  Still had pain at extremes.  Follow-Up Instructions: Return if symptoms worsen or fail to improve.    Face-to-face time spent with patient was greater than 30 minutes.  Greater than 50% of the time was spent in counseling and coordination of care.  Orders:  Orders Placed This Encounter  Procedures  . XR Shoulder Right   No orders of the defined types were placed in this encounter.     Procedures: Large Joint Inj: R glenohumeral on 02/20/2018 1:19 PM Indications: pain and diagnostic evaluation Details: 25 G 1.5 in needle, anterolateral approach  Arthrogram: No  Medications: 2 mL lidocaine 2 %; 2 mL bupivacaine 0.5 %; 80 mg methylPREDNISolone acetate 40 MG/ML Procedure, treatment alternatives, risks and benefits explained, specific risks discussed. Consent was given by the patient. Immediately prior to procedure a time out was called to verify the correct patient, procedure, equipment, support staff and site/side marked as required. Patient was prepped and draped in the usual sterile fashion.       Clinical Data: No additional findings.   Subjective: Chief Complaint  Patient presents with  . Follow-up    R SHOULDER PAIN GOTTEN WORSE BEGINNING OF JULY NO INJURY     HPI  [name] is a very pleasant 72 year old white female who is seen today for evaluation of her right shoulder.  She did have an arthroscopic subacromial  decompression of the shoulder with distal clavicle excision.  This occurred at the end of August 2018.  She had a significant massive rotator cuff tear and we were unable to oppose the edge to edge.  There was still a gap present.  She did appropriate physical therapy.   Review of Systems  Constitutional: Negative for fatigue and fever.  HENT: Negative for ear pain.   Eyes: Negative for pain.  Respiratory: Positive for shortness of breath. Negative for cough.   Cardiovascular: Negative for leg swelling.  Gastrointestinal: Positive for diarrhea. Negative for constipation.  Genitourinary: Negative for difficulty urinating.  Musculoskeletal: Positive for back pain. Negative for neck pain.  Skin: Negative for rash.  Allergic/Immunologic: Negative for food allergies.  Neurological: Positive for weakness. Negative for numbness.  Hematological: Does not bruise/bleed easily.  Psychiatric/Behavioral: Positive for sleep disturbance.     Objective: Vital Signs: BP (!) 161/92 (BP Location: Left Arm, Patient Position: Sitting, Cuff Size: Normal)   Pulse 78   Ht 5\' 4"  (1.626 m)   Wt 246 lb (111.6 kg)   BMI 42.23 kg/m   Physical Exam  Constitutional: She is oriented to person, place, and time. She appears well-developed and well-nourished.  HENT:  Mouth/Throat: Oropharynx is clear and moist.  Eyes: Pupils are equal, round, and reactive to light. EOM are normal.  Pulmonary/Chest: Effort normal.  Neurological: She is alert and oriented to  person, place, and time.  Skin: Skin is warm and dry.  Psychiatric: She has a normal mood and affect. Her behavior is normal.    Ortho Exam  Today she has limited motion of the right shoulder.  I can abductor to only about 35 degrees.  Forward flexion to about 45 degrees.  External rotation with the elbow at her side about 10 degrees only internally about 45 degrees.  She does have painful range of motion.  Limited testing and strength secondary to decreased  range of motion.  Specialty Comments:  No specialty comments available.  Imaging: Xr Shoulder Right  Result Date: 02/20/2018 3 View x-ray of the right shoulder reveals marked elevation of the humeral head in comparison to April 2018. there is narrowing of the glenohumeral joint space on the axillary lateral.  Type I acromion.      PMFS History: Current Outpatient Medications  Medication Sig Dispense Refill  . acetaminophen (TYLENOL) 500 MG tablet Take 1,000 mg by mouth 2 (two) times daily as needed for moderate pain.    Marland Kitchen albuterol (PROVENTIL HFA;VENTOLIN HFA) 108 (90 BASE) MCG/ACT inhaler Inhale 2 puffs into the lungs every 4 (four) hours as needed for wheezing or shortness of breath (cough, shortness of breath or wheezing.). 1 Inhaler 12  . amLODipine (NORVASC) 5 MG tablet Take 5 mg by mouth Daily.     Marland Kitchen aspirin 81 MG tablet Take 81 mg by mouth at bedtime.     Marland Kitchen b complex vitamins tablet Take 1 tablet by mouth daily.     . B-D ALLERGY SYRINGE 1CC/28G 28G X 1/2" 1 ML MISC USE TO INJECT METHOTREXATE ONCE WEEKLY 12 each 4  . baclofen (LIORESAL) 10 MG tablet TAKE 1 TABLET BY MOUTH TWICE A DAY AS NEEDED FOR SPASMS 180 tablet 0  . budesonide (PULMICORT) 0.5 MG/2ML nebulizer solution Take 0.5 mg by nebulization 2 (two) times daily.    Marland Kitchen buPROPion (WELLBUTRIN XL) 150 MG 24 hr tablet Take 150 mg by mouth daily.     . busPIRone (BUSPAR) 7.5 MG tablet Take 7.5 mg by mouth daily. May take a second dose of 7.5 mg as needed for anxiety    . Cholecalciferol (VITAMIN D3) 5000 units CAPS Take 5,000 Units by mouth daily.    . collagenase (SANTYL) ointment Apply 1 application topically daily. (Patient taking differently: Apply 1 application topically daily as needed (skin sores). ) 30 g 0  . [employer] ALCOHOL SWABS PADS WIPE AREA WITH ALCOHOL PAD PRIOR TO GIVING METHOTREXATE INJECTION 100 each 0  . doxycycline (MONODOX) 100 MG capsule Take 100 mg by mouth 2 (two) times daily.  0  . DULoxetine (CYMBALTA) 60 MG  capsule Take 60 mg by mouth daily.     . folic acid (FOLVITE) 1 MG tablet Take 2 tablets (2 mg total) by mouth daily. (Patient taking differently: Take 1 mg by mouth 2 (two) times daily. ) 180 tablet 4  . gabapentin (NEURONTIN) 300 MG capsule Take 600 mg by mouth 3 (three) times daily.     Marland Kitchen galantamine (RAZADYNE ER) 8 MG 24 hr capsule TAKE ONE EACH EVERY MORNING WITH FOOD (SHE FAILED ARICEPT DUE TO SIDE EFFECTS)  11  . hydrochlorothiazide 25 MG tablet Take 25 mg by mouth daily.      . hydroxychloroquine (PLAQUENIL) 200 MG tablet Take 1 tablet (200 mg total) by mouth 2 (two) times daily. 180 tablet 0  . loperamide (IMODIUM) 2 MG capsule Take 2 mg by mouth as  needed for diarrhea or loose stools.    Marland Kitchen loratadine (CLARITIN) 10 MG tablet Take 10 mg by mouth daily.    . meloxicam (MOBIC) 15 MG tablet Take 15 mg by mouth daily.      . Menthol, Topical Analgesic, (BIOFREEZE EX) Apply 1 application topically 2 (two) times daily.    . methotrexate 50 MG/2ML injection INJECT 0.8 MILLILITER SUBCUTANEOUSLY EVERY WEEK 10 mL 0  . montelukast (SINGULAIR) 10 MG tablet Take 10 mg by mouth daily.  11  . Multiple Vitamin (MULTIVITAMIN) tablet Take 1 tablet by mouth daily.      . Multiple Vitamins-Minerals (HAIR/SKIN/NAILS) TABS Take 1 tablet by mouth daily.    . nebivolol (BYSTOLIC) 10 MG tablet Take 10 mg by mouth daily.      Marland Kitchen omeprazole (PRILOSEC) 40 MG capsule Take 1 capsule (40 mg total) by mouth 2 (two) times daily. 60 capsule 3  . OVER THE COUNTER MEDICATION Take 1 tablet by mouth 2 (two) times daily. Cherry Flex otc supplement    . OXYGEN Inhale 2 L into the lungs at bedtime as needed.    . Potassium 99 MG TABS Take 99 mg by mouth 2 (two) times daily.    . Probiotic CAPS Take 1 capsule by mouth daily.    . vitamin B-12 (CYANOCOBALAMIN) 1000 MCG tablet Take 1,000 mcg by mouth daily.    . vitamin E 400 UNIT capsule Take 400 Units by mouth daily.     No current facility-administered medications for this  visit.     Patient Active Problem List   Diagnosis Date Noted  . Diarrhea   . Benign neoplasm of cecum   . Abdominal pain, epigastric   . Gastroesophageal reflux disease   . Dependence on nocturnal oxygen therapy 07/10/2017  . Open wound of left lower leg 12/26/2016  . Vitamin D deficiency 09/14/2016  . History of total knee replacement, bilateral 09/14/2016  . DJD (degenerative joint disease), cervical 09/14/2016  . 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 High risk medication use 05/20/2016  . Spinal stenosis, lumbar region, with neurogenic claudication 09/08/2015    Class: Chronic  . OSA (obstructive sleep apnea) 10/17/2013  . Dyspnea 09/04/2013  . Atypical chest pain 09/04/2013  . Morbid obesity ([place]) 03/21/2012  . Cancer of lower-outer quadrant of female breast ([place]) 03/15/2012  . Diabetes mellitus type II 02/22/2011  . Coronary artery disease   . Hypertension   . Hyperlipidemia   . OA (osteoarthritis) of knee    Past Medical History:  Diagnosis Date  . Asthma    Albuterol inhaler as needed.Pulmicort neb as needed  . Asthma   . Breast cancer ([place])    right - lumpectomy   . Chronic back pain    spinal stenosis  . Coronary artery disease   . Dependence on nocturnal oxygen therapy 07/10/2017   Pt uses 2 liters 02 at night   . Depression    takes CYmbalta and Wellbutrin daily  . Diabetes mellitus    not on any meds/controlled by diet  . Dyspnea    with exertion occasionally when lies down  . GERD (gastroesophageal reflux disease)    takes Omeprazole daily  . Headache    oocasionally  . History of kidney stones   . Hyperlipidemia    not on any meds  . Hypertension    takes Benazepril,Bystolic,and Amlodipine daily  . IBS (irritable bowel syndrome)   . Joint pain   .  Joint swelling   . Nocturia   . OA (osteoarthritis) of knee   . OSA (obstructive sleep apnea)   . Oxygen deficiency    2 liters at night per pt for OSA-  no cpap   . Peripheral edema    takes daily as needed  . Peripheral neuropathy   . Personal history of chemotherapy   . Personal history of radiation therapy   . Pneumonia    hx of-several yrs ago  . RA (rheumatoid arthritis) ([place])   . Sleep apnea    pt states she uses oxygen at night - no cpap- uses 02 2 liters at night   . Urinary frequency   . Urinary urgency   . Weakness    numbness and tingling mainly in left leg occasionally in right.Tingling/numbness in hands    Family History  Problem Relation Age of Onset  . Heart disease Mother   . Cancer Father   . Cancer Brother   . Heart disease Brother     Past Surgical History:  Procedure Laterality Date  . BREAST LUMPECTOMY  1997   RIGHT BREAST  . CARDIAC CATHETERIZATION  04/23/2004   EF 60%  . cataract surgery Bilateral   . COLONOSCOPY    . COLONOSCOPY WITH PROPOFOL N/A 08/07/2017   Procedure: COLONOSCOPY WITH PROPOFOL;  Surgeon: Ladene Artist, MD;  Location: [facility];  Service: Endoscopy;  Laterality: N/A;  . CORONARY ARTERY BYPASS GRAFT  2005   LIMA GRAFT TO LAD, SAPHENOUS VEIN GRAFT TO THE FIRST DIAGONAL, AND LEFT RADIAL ARTERY GRAFT TO THE OM  . ESOPHAGOGASTRODUODENOSCOPY (EGD) WITH PROPOFOL N/A 08/07/2017   Procedure: ESOPHAGOGASTRODUODENOSCOPY (EGD) WITH PROPOFOL;  Surgeon: Ladene Artist, MD;  Location: [facility];  Service: Endoscopy;  Laterality: N/A;  . FOOT SURGERY Right   . JOINT REPLACEMENT    . LITHOTRIPSY    . LUMBAR LAMINECTOMY/DECOMPRESSION MICRODISCECTOMY N/A 09/08/2015   Procedure: CENTRAL DECOMPRESSIVE LUMBAR LAMINECTOMIES L3-4, L4-5 AND BILATERAL HEMILAMINECTOMY L5-S1;  Surgeon: Jessy Oto, MD;  Location: [place];  Service: Orthopedics;  Laterality: N/A;  . nodule removed from left elbow    . SHOULDER ARTHROSCOPY    . TOTAL KNEE ARTHROPLASTY Bilateral   . TOTAL SHOULDER ARTHROPLASTY    . TRIPLE BYPASS  04/27/05  . US ECHOCARDIOGRAPHY  01/06/2009   EF 55-60%  . WRIST SURGERY Left    Social  History   Occupational History  . Occupation: retired    Fish farm manager: UNEMPLOYED  Tobacco Use  . Smoking status: Never Smoker  . Smokeless tobacco: Never Used  Substance and Sexual Activity  . Alcohol use: No    Alcohol/week: 0.0 oz  . Drug use: No  . Sexual activity: Not on file

## 2018-02-27 ENCOUNTER — Telehealth (INDEPENDENT_AMBULATORY_CARE_PROVIDER_SITE_OTHER): Payer: Self-pay | Admitting: Orthopaedic Surgery

## 2018-02-27 ENCOUNTER — Other Ambulatory Visit (INDEPENDENT_AMBULATORY_CARE_PROVIDER_SITE_OTHER): Payer: Self-pay | Admitting: Radiology

## 2018-02-27 DIAGNOSIS — G8929 Other chronic pain: Secondary | ICD-10-CM

## 2018-02-27 DIAGNOSIS — M25511 Pain in right shoulder: Principal | ICD-10-CM

## 2018-02-27 NOTE — Telephone Encounter (Signed)
Patient called stating she saw Aaron Edelman on 02/20/18 for right shoulder pain.  Patient is checking on the status of the referral that Aaron Edelman was going to send to Dr. Marlou Sa.

## 2018-02-27 NOTE — Telephone Encounter (Signed)
SENT REFERRAL IN AND NOTIFIED PT.

## 2018-02-28 DIAGNOSIS — I87333 Chronic venous hypertension (idiopathic) with ulcer and inflammation of bilateral lower extremity: Secondary | ICD-10-CM | POA: Diagnosis not present

## 2018-02-28 DIAGNOSIS — Z6841 Body Mass Index (BMI) 40.0 and over, adult: Secondary | ICD-10-CM | POA: Diagnosis not present

## 2018-02-28 DIAGNOSIS — S81802A Unspecified open wound, left lower leg, initial encounter: Secondary | ICD-10-CM | POA: Diagnosis not present

## 2018-02-28 NOTE — Progress Notes (Signed)
Office Visit Note  Patient: [name]             Date of Birth: 07-12-1946           MRN: [id number]             PCP: Crist Infante, MD Referring: Crist Infante, MD Visit Date: 03/07/2018 Occupation: @GUAROCC @  Subjective:  Arthritis (Bil hand pain, Left leg pain, redness, knots, Dr. Joylene Draft - treating wound - [place] 03/06/18, )   History of Present Illness: [name] is a 72 y.o. female with history of seronegative rheumatoid arthritis.  She states she has been having pain and discomfort in her right shoulder joint.  She had a history of right rotator cuff tear repair in the past.  She states the pain in her right shoulder joint is severe enough that she has nocturnal pain has difficulty doing activities.  She was recently seen by Aaron Edelman and was advised to have total right total shoulder replacement.  She continues to have discomfort in her cervical and lumbar spine.  She has bilateral total knee replacement which continue to hurt as well.  She had a wound on her left lower extremity for which she was seeing Dr. Haynes Kerns.  Patient states she had 3 rounds of doxycycline and she is a still on doxycycline.  She states she came off methotrexate for about 3 weeks and then resume methotrexate.  She states the wound is not completely healed.  She is also having irritation from the compression socks she has been wearing.  She has noticed some knots on her left lower extremity.  She states she had ultrasound of her left lower extremity yesterday by Dr. Joylene Draft.  Activities of Daily Living:  Patient reports morning stiffness for 2 hours.   Patient Denies nocturnal pain.  Difficulty dressing/grooming: Reports Difficulty climbing stairs: Reports Difficulty getting out of chair: Reports Difficulty using hands for taps, buttons, cutlery, and/or writing: Reports  Review of Systems  Constitutional: Positive for fatigue. Negative for night sweats, weight gain and weight loss.  HENT: Positive for mouth  dryness. Negative for mouth sores, trouble swallowing, trouble swallowing and nose dryness.   Eyes: Negative for pain, redness, visual disturbance and dryness.  Respiratory: Negative for cough, shortness of breath and difficulty breathing.   Cardiovascular: Negative for chest pain, palpitations, hypertension, irregular heartbeat and swelling in legs/feet.  Gastrointestinal: Positive for diarrhea. Negative for blood in stool and constipation.  Endocrine: Negative for increased urination.  Genitourinary: Negative for involuntary urination and vaginal dryness.  Musculoskeletal: Positive for arthralgias, gait problem, joint pain, joint swelling and muscle weakness. Negative for myalgias, morning stiffness, muscle tenderness and myalgias.  Skin: Positive for rash. Negative for color change, hair loss, skin tightness, ulcers and sensitivity to sunlight.  Allergic/Immunologic: Negative for susceptible to infections.  Neurological: Positive for weakness. Negative for dizziness, memory loss and night sweats.  Hematological: Negative for bruising/bleeding tendency and swollen glands.  Psychiatric/Behavioral: Negative for depressed mood and sleep disturbance. The patient is not nervous/anxious.     PMFS History:  Patient Active Problem List   Diagnosis Date Noted  . Diarrhea   . Benign neoplasm of cecum   . Abdominal pain, epigastric   . Gastroesophageal reflux disease   . Dependence on nocturnal oxygen therapy 07/10/2017  . Open wound of left lower leg 12/26/2016  . Vitamin D deficiency 09/14/2016  . History of total knee replacement, bilateral 09/14/2016  . DJD (degenerative joint disease), cervical 09/14/2016  .  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 High risk medication use 05/20/2016  . Spinal stenosis, lumbar region, with neurogenic claudication 09/08/2015    Class: Chronic  . OSA (obstructive sleep apnea) 10/17/2013  . Dyspnea 09/04/2013  .  Atypical chest pain 09/04/2013  . Morbid obesity ([place]) 03/21/2012  . Cancer of lower-outer quadrant of female breast ([place]) 03/15/2012  . Diabetes mellitus type II 02/22/2011  . Coronary artery disease   . Hypertension   . Hyperlipidemia   . OA (osteoarthritis) of knee     Past Medical History:  Diagnosis Date  . Asthma    Albuterol inhaler as needed.Pulmicort neb as needed  . Asthma   . Breast cancer ([place])    right - lumpectomy   . Chronic back pain    spinal stenosis  . Coronary artery disease   . Dependence on nocturnal oxygen therapy 07/10/2017   Pt uses 2 liters 02 at night   . Depression    takes CYmbalta and Wellbutrin daily  . Diabetes mellitus    not on any meds/controlled by diet  . Dyspnea    with exertion occasionally when lies down  . GERD (gastroesophageal reflux disease)    takes Omeprazole daily  . Headache    oocasionally  . History of kidney stones   . Hyperlipidemia    not on any meds  . Hypertension    takes Benazepril,Bystolic,and Amlodipine daily  . IBS (irritable bowel syndrome)   . Joint pain   . Joint swelling   . Nocturia   . OA (osteoarthritis) of knee   . OSA (obstructive sleep apnea)   . Oxygen deficiency    2 liters at night per pt for OSA- no cpap   . Peripheral edema    takes daily as needed  . Peripheral neuropathy   . Personal history of chemotherapy   . Personal history of radiation therapy   . Pneumonia    hx of-several yrs ago  . RA (rheumatoid arthritis) ([place])   . Sleep apnea    pt states she uses oxygen at night - no cpap- uses 02 2 liters at night   . Urinary frequency   . Urinary urgency   . Weakness    numbness and tingling mainly in left leg occasionally in right.Tingling/numbness in hands    Family History  Problem Relation Age of Onset  . Heart disease Mother   . Cancer Father   . Cancer Brother   . Heart disease Brother    Past Surgical History:  Procedure Laterality Date  . BREAST LUMPECTOMY  1997    RIGHT BREAST  . CARDIAC CATHETERIZATION  04/23/2004   EF 60%  . cataract surgery Bilateral   . COLONOSCOPY    . COLONOSCOPY WITH PROPOFOL N/A 08/07/2017   Procedure: COLONOSCOPY WITH PROPOFOL;  Surgeon: Ladene Artist, MD;  Location: [facility];  Service: Endoscopy;  Laterality: N/A;  . CORONARY ARTERY BYPASS GRAFT  2005   LIMA GRAFT TO LAD, SAPHENOUS VEIN GRAFT TO THE FIRST DIAGONAL, AND LEFT RADIAL ARTERY GRAFT TO THE OM  . ESOPHAGOGASTRODUODENOSCOPY (EGD) WITH PROPOFOL N/A 08/07/2017   Procedure: ESOPHAGOGASTRODUODENOSCOPY (EGD) WITH PROPOFOL;  Surgeon: Ladene Artist, MD;  Location: [facility];  Service: Endoscopy;  Laterality: N/A;  . FOOT SURGERY Right   . JOINT REPLACEMENT    . LITHOTRIPSY    . LUMBAR LAMINECTOMY/DECOMPRESSION MICRODISCECTOMY N/A 09/08/2015   Procedure: CENTRAL DECOMPRESSIVE LUMBAR LAMINECTOMIES L3-4, L4-5 AND BILATERAL HEMILAMINECTOMY  L5-S1;  Surgeon: Jessy Oto, MD;  Location: [facility];  Service: Orthopedics;  Laterality: N/A;  . nodule removed from left elbow    . SHOULDER ARTHROSCOPY    . TOTAL KNEE ARTHROPLASTY Bilateral   . TOTAL SHOULDER ARTHROPLASTY    . TRIPLE BYPASS  04/27/05  . US ECHOCARDIOGRAPHY  01/06/2009   EF 55-60%  . WRIST SURGERY Left    Social History   Social History Narrative  . Not on file    Objective: Vital Signs: BP (!) 166/85 (BP Location: Left Arm, Patient Position: Sitting, Cuff Size: Normal)   Pulse 63   Resp 18   Ht 5\' 4"  (1.626 m)   Wt 241 lb 6.4 oz (109.5 kg)   BMI 41.44 kg/m    Physical Exam  Constitutional: She is oriented to person, place, and time. She appears well-developed and well-nourished.  HENT:  Head: Normocephalic and atraumatic.  Eyes: Conjunctivae and EOM are normal.  Neck: Normal range of motion.  Cardiovascular: Normal rate, regular rhythm, normal heart sounds and intact distal pulses.  Pulmonary/Chest: Effort normal and breath sounds normal.  Abdominal: Soft. Bowel sounds are normal.    Musculoskeletal: She exhibits edema.  Lateral lower extremity and varicose veins were noted.  Lymphadenopathy:    She has no cervical adenopathy.  Neurological: She is alert and oriented to person, place, and time.  Skin: Skin is warm and dry. Capillary refill takes less than 2 seconds.  2 mm area of healing wound was present over left calf  Psychiatric: She has a normal mood and affect. Her behavior is normal.  Nursing note and vitals reviewed.    Musculoskeletal Exam: C-spine limited range of motion.  She has thoracic kyphosis.  She has limited painful range of motion of her lumbar spine.  She had prior lumbar spine fusion.  Right shoulder joint abduction was limited to 30 degrees which was painful.  Left shoulder joint was in full range of motion.  Elbow joints were in good range of motion.  She had mild synovitis of her right wrist joints.  PIP and DIP thickening was noted.  No synovitis was noted over MCPs or PIPs.  Bilateral knee joints are replaced which appear to be doing well.  She had good range of motion of her ankle joints MTPs PIPs and DIPs without synovitis.  CDAI Exam: CDAI Homunculus Exam:   Tenderness:  RUE: glenohumeral and wrist  Swelling:  RUE: wrist  Joint Counts:  CDAI Tender Joint count: 2 CDAI Swollen Joint count: 1  Global Assessments:  Patient Global Assessment: 5 Provider Global Assessment: 5  CDAI Calculated Score: 13   Investigation: No additional findings.  Imaging: Xr Shoulder Right  Result Date: 02/20/2018 3 View x-ray of the right shoulder reveals marked elevation of the humeral head in comparison to April 2018. there is narrowing of the glenohumeral joint space on the axillary lateral.  Type I acromion.     Recent Labs: Lab Results  Component Value Date   WBC 7.4 01/04/2018   HGB 13.7 01/04/2018   PLT 160 01/04/2018   NA 139 01/04/2018   K 3.7 01/04/2018   CL 101 01/04/2018   CO2 28 01/04/2018   GLUCOSE 182 (H) 01/04/2018   BUN  14 01/04/2018   CREATININE 0.70 01/04/2018   BILITOT 0.4 01/04/2018   ALKPHOS 63 01/04/2018   AST 27 01/04/2018   ALT 31 01/04/2018   PROT 7.4 01/04/2018   ALBUMIN 4.3 01/04/2018   CALCIUM 9.5  01/04/2018   GFRAA >60 01/04/2018    Speciality Comments: No specialty comments available.  Procedures:  No procedures performed Allergies: Statins   Assessment / Plan:     Visit Diagnoses: Rheumatoid arthritis of multiple sites with negative rheumatoid factor (HCC)-she has been having increased pain and discomfort.  She had to come off methotrexate for about 3 weeks due to a healing wound on her left lower extremity.  The wound is a still in the healing process.  She resumed her methotrexate.  Have advised her to hold methotrexate until the wound is completely healed.  High risk medication use - PLQ200mg  po bid, MTX 0.8 ml sq q wk, folic acid 2mg  po qd.  Her labs are stable.  She will get labs every 3 months to monitor for drug toxicity.  A prescription refill for methotrexate was given per her request.  She will restart methotrexate after the wound is healed.  Chronic right shoulder pain - Status post right rotator cuff tear repair, arthritis shoulder joint.  Patient was advised total shoulder replacement by orthopedics.  History of total knee replacement, bilateral-doing well without any warmth swelling or effusion.  DDD (degenerative disc disease), cervical-she has limited range of motion which is painful.  DDD (degenerative disc disease), lumbar-status post fusion.  She has chronic pain and discomfort.  Wound on left lower extremity-patient is followed up at her PCPs office.  She is currently on doxycycline.  I have advised her to hold off methotrexate until she gets clearance from her PCP once the wound has healed.  History of diabetes mellitus-she complains of neurology is in her lower extremities which could be secondary to neuropathy.  History of vitamin D deficiency-she is on  vitamin D supplement.  History of coronary artery disease  History of breast cancer  History of sleep apnea  History of obesity  History of hypertension   Orders: No orders of the defined types were placed in this encounter.  Meds ordered this encounter  Medications  . methotrexate 50 MG/2ML injection    Sig: Inject 0.45mL into the skin, once weekly.    Dispense:  10 mL    Refill:  0    Face-to-face time spent with patient was 45 minutes. Greater than 50% of time was spent in counseling and coordination of care.  Follow-Up Instructions: Return in about 5 months (around 08/07/2018) for Rheumatoid arthritis, Osteoarthritis,DDD.   Bo Merino, MD  Note - This record has been created using Editor, commissioning.  Chart creation errors have been sought, but may not always  have been located. Such creation errors do not reflect on  the standard of medical care.

## 2018-03-01 ENCOUNTER — Telehealth: Payer: Self-pay | Admitting: Rheumatology

## 2018-03-01 ENCOUNTER — Other Ambulatory Visit: Payer: Self-pay | Admitting: Rheumatology

## 2018-03-01 NOTE — Telephone Encounter (Signed)
ok 

## 2018-03-01 NOTE — Telephone Encounter (Signed)
Opened in error

## 2018-03-01 NOTE — Telephone Encounter (Addendum)
Last Visit: 12/04/17 Next Visit: 03/07/18 Labs: 01/04/18 elevated glucose  PLQ eye exam 05/20/16 WNL  Left message to advise patient need update PLQ eye exam  Okay to refill 30 day supply PLQ?

## 2018-03-02 ENCOUNTER — Other Ambulatory Visit: Payer: Self-pay

## 2018-03-02 DIAGNOSIS — I872 Venous insufficiency (chronic) (peripheral): Secondary | ICD-10-CM

## 2018-03-05 DIAGNOSIS — N858 Other specified noninflammatory disorders of uterus: Secondary | ICD-10-CM | POA: Diagnosis not present

## 2018-03-05 DIAGNOSIS — N950 Postmenopausal bleeding: Secondary | ICD-10-CM | POA: Diagnosis not present

## 2018-03-06 ENCOUNTER — Ambulatory Visit (HOSPITAL_COMMUNITY)
Admission: RE | Admit: 2018-03-06 | Discharge: 2018-03-06 | Disposition: A | Discharge status: Home / Self Care | Payer: Medicare HMO | Source: Ambulatory Visit | Attending: Vascular Surgery | Admitting: Vascular Surgery

## 2018-03-06 DIAGNOSIS — I872 Venous insufficiency (chronic) (peripheral): Secondary | ICD-10-CM | POA: Diagnosis not present

## 2018-03-07 ENCOUNTER — Ambulatory Visit: Payer: Medicare HMO | Admitting: Rheumatology

## 2018-03-07 ENCOUNTER — Other Ambulatory Visit: Payer: Self-pay

## 2018-03-07 ENCOUNTER — Ambulatory Visit: Payer: Medicare HMO | Admitting: Vascular Surgery

## 2018-03-07 ENCOUNTER — Encounter: Payer: Self-pay | Admitting: Rheumatology

## 2018-03-07 ENCOUNTER — Encounter: Payer: Self-pay | Admitting: Vascular Surgery

## 2018-03-07 VITALS — BP 139/81 | HR 67 | Temp 98.4°F | Resp 18 | Ht 64.0 in | Wt 241.0 lb

## 2018-03-07 VITALS — BP 166/85 | HR 63 | Resp 18 | Ht 64.0 in | Wt 241.4 lb

## 2018-03-07 DIAGNOSIS — Z853 Personal history of malignant neoplasm of breast: Secondary | ICD-10-CM

## 2018-03-07 DIAGNOSIS — Z79899 Other long term (current) drug therapy: Secondary | ICD-10-CM | POA: Diagnosis not present

## 2018-03-07 DIAGNOSIS — Z8679 Personal history of other diseases of the circulatory system: Secondary | ICD-10-CM

## 2018-03-07 DIAGNOSIS — Z8669 Personal history of other diseases of the nervous system and sense organs: Secondary | ICD-10-CM

## 2018-03-07 DIAGNOSIS — Z8639 Personal history of other endocrine, nutritional and metabolic disease: Secondary | ICD-10-CM | POA: Diagnosis not present

## 2018-03-07 DIAGNOSIS — M51369 Other intervertebral disc degeneration, lumbar region without mention of lumbar back pain or lower extremity pain: Secondary | ICD-10-CM

## 2018-03-07 DIAGNOSIS — M5030 Other cervical disc degeneration, unspecified cervical region: Secondary | ICD-10-CM | POA: Diagnosis not present

## 2018-03-07 DIAGNOSIS — Z96653 Presence of artificial knee joint, bilateral: Secondary | ICD-10-CM | POA: Diagnosis not present

## 2018-03-07 DIAGNOSIS — M25511 Pain in right shoulder: Secondary | ICD-10-CM | POA: Diagnosis not present

## 2018-03-07 DIAGNOSIS — M5136 Other intervertebral disc degeneration, lumbar region: Secondary | ICD-10-CM

## 2018-03-07 DIAGNOSIS — G8929 Other chronic pain: Secondary | ICD-10-CM

## 2018-03-07 DIAGNOSIS — I83813 Varicose veins of bilateral lower extremities with pain: Secondary | ICD-10-CM | POA: Diagnosis not present

## 2018-03-07 DIAGNOSIS — M0609 Rheumatoid arthritis without rheumatoid factor, multiple sites: Secondary | ICD-10-CM | POA: Diagnosis not present

## 2018-03-07 MED ORDER — METHOTREXATE SODIUM CHEMO INJECTION 50 MG/2ML: INTRAMUSCULAR | 0 refills | Status: DC

## 2018-03-07 NOTE — Progress Notes (Signed)
Referring Physician: Dr Joylene Draft  Patient name: [name] MRN: [id number] DOB: Dec 11, 1945 Sex: female  REASON FOR CONSULT: Varicose veins with pain swelling and left leg ulceration  HPI: [name] is a 72 y.o. female, with a long standing history of exacerbation and remission of lower extremity ulcerations.  In the remote past she had laser ablation of the right and left greater saphenous vein.  This was done at [facility].  She has intermittently had ulcerations on her calf off and on for years.  She occasionally requires antibiotics to calm down a cellulitis.  She states that usually the ulcers can take several months to heal.  She also has fairly severe rheumatoid arthritis and takes methotrexate for this.  She has also recently been diagnosed with peripheral neuropathy.  She currently is using the due to sock to treat the ulceration in her left leg.  He states that she thinks the ulcer is getting smaller.  She has worn compression stockings in the past.  Other medical problems include asthma, chronic back pain, coronary artery disease, home O2 at night, diabetes, hypertension, sleep apnea.  All of these are currently stable.  Past Medical History:  Diagnosis Date  . Asthma    Albuterol inhaler as needed.Pulmicort neb as needed  . Asthma   . Breast cancer ([place])    right - lumpectomy   . Chronic back pain    spinal stenosis  . Coronary artery disease   . Dependence on nocturnal oxygen therapy 07/10/2017   Pt uses 2 liters 02 at night   . Depression    takes CYmbalta and Wellbutrin daily  . Diabetes mellitus    not on any meds/controlled by diet  . Dyspnea    with exertion occasionally when lies down  . GERD (gastroesophageal reflux disease)    takes Omeprazole daily  . Headache    oocasionally  . History of kidney stones   . Hyperlipidemia    not on any meds  . Hypertension    takes Benazepril,Bystolic,and Amlodipine daily  . IBS (irritable bowel syndrome)   .  Joint pain   . Joint swelling   . Nocturia   . OA (osteoarthritis) of knee   . OSA (obstructive sleep apnea)   . Oxygen deficiency    2 liters at night per pt for OSA- no cpap   . Peripheral edema    takes daily as needed  . Peripheral neuropathy   . Personal history of chemotherapy   . Personal history of radiation therapy   . Pneumonia    hx of-several yrs ago  . RA (rheumatoid arthritis) ([place])   . Sleep apnea    pt states she uses oxygen at night - no cpap- uses 02 2 liters at night   . Urinary frequency   . Urinary urgency   . Weakness    numbness and tingling mainly in left leg occasionally in right.Tingling/numbness in hands   Past Surgical History:  Procedure Laterality Date  . BREAST LUMPECTOMY  1997   RIGHT BREAST  . CARDIAC CATHETERIZATION  04/23/2004   EF 60%  . cataract surgery Bilateral   . COLONOSCOPY    . COLONOSCOPY WITH PROPOFOL N/A 08/07/2017   Procedure: COLONOSCOPY WITH PROPOFOL;  Surgeon: Ladene Artist, MD;  Location: [facility];  Service: Endoscopy;  Laterality: N/A;  . CORONARY ARTERY BYPASS GRAFT  2005   LIMA GRAFT TO LAD, SAPHENOUS VEIN GRAFT TO THE FIRST DIAGONAL, AND LEFT  RADIAL ARTERY GRAFT TO THE OM  . ESOPHAGOGASTRODUODENOSCOPY (EGD) WITH PROPOFOL N/A 08/07/2017   Procedure: ESOPHAGOGASTRODUODENOSCOPY (EGD) WITH PROPOFOL;  Surgeon: Ladene Artist, MD;  Location[facility];  Service: Endoscopy;  Laterality: N/A;  . FOOT SURGERY Right   . JOINT REPLACEMENT    . LITHOTRIPSY    . LUMBAR LAMINECTOMY/DECOMPRESSION MICRODISCECTOMY N/A 09/08/2015   Procedure: CENTRAL DECOMPRESSIVE LUMBAR LAMINECTOMIES L3-4, L4-5 AND BILATERAL HEMILAMINECTOMY L5-S1;  Surgeon: Jessy Oto, MD;  Location: [place];  Service: Orthopedics;  Laterality: N/A;  . nodule removed from left elbow    . SHOULDER ARTHROSCOPY    . TOTAL KNEE ARTHROPLASTY Bilateral   . TOTAL SHOULDER ARTHROPLASTY    . TRIPLE BYPASS  04/27/05  . US ECHOCARDIOGRAPHY  01/06/2009   EF 55-60%  .  WRIST SURGERY Left     Family History  Problem Relation Age of Onset  . Heart disease Mother   . Cancer Father   . Cancer Brother   . Heart disease Brother     SOCIAL HISTORY: Social History   Socioeconomic History  . Marital status: Widowed    Spouse name: Not on file  . Number of children: 2  . Years of education: Not on file  . Highest education level: Not on file  Occupational History  . Occupation: retired    Fish farm manager: UNEMPLOYED  Social Needs  . Financial resource strain: Not on file  . Food insecurity:    Worry: Not on file    Inability: Not on file  . Transportation needs:    Medical: Not on file    Non-medical: Not on file  Tobacco Use  . Smoking status: Never Smoker  . Smokeless tobacco: Never Used  Substance and Sexual Activity  . Alcohol use: No    Alcohol/week: 0.0 standard drinks  . Drug use: No  . Sexual activity: Not on file  Lifestyle  . Physical activity:    Days per week: Not on file    Minutes per session: Not on file  . Stress: Not on file  Relationships  . Social connections:    Talks on phone: Not on file    Gets together: Not on file    Attends religious service: Not on file    Active member of club or organization: Not on file    Attends meetings of clubs or organizations: Not on file    Relationship status: Not on file  . Intimate partner violence:    Fear of current or ex partner: Not on file    Emotionally abused: Not on file    Physically abused: Not on file    Forced sexual activity: Not on file  Other Topics Concern  . Not on file  Social History Narrative  . Not on file    Allergies  Allergen Reactions  . Statins Other (See Comments)    Joint pain and swelling/burning    Current Outpatient Medications  Medication Sig Dispense Refill  . acetaminophen (TYLENOL) 500 MG tablet Take 1,000 mg by mouth 2 (two) times daily as needed for moderate pain.    Marland Kitchen albuterol (PROVENTIL HFA;VENTOLIN HFA) 108 (90 BASE) MCG/ACT  inhaler Inhale 2 puffs into the lungs every 4 (four) hours as needed for wheezing or shortness of breath (cough, shortness of breath or wheezing.). 1 Inhaler 12  . amLODipine (NORVASC) 5 MG tablet Take 5 mg by mouth Daily.     Marland Kitchen aspirin 81 MG tablet Take 81 mg by mouth at bedtime.     Marland Kitchen  b complex vitamins tablet Take 1 tablet by mouth daily.     . B-D ALLERGY SYRINGE 1CC/28G 28G X 1/2" 1 ML MISC USE TO INJECT METHOTREXATE ONCE WEEKLY 12 each 4  . baclofen (LIORESAL) 10 MG tablet TAKE 1 TABLET BY MOUTH TWICE A DAY AS NEEDED FOR SPASMS 180 tablet 0  . budesonide (PULMICORT) 0.5 MG/2ML nebulizer solution Take 0.5 mg by nebulization 2 (two) times daily.    Marland Kitchen buPROPion (WELLBUTRIN XL) 150 MG 24 hr tablet Take 150 mg by mouth daily.     . busPIRone (BUSPAR) 7.5 MG tablet Take 7.5 mg by mouth daily. May take a second dose of 7.5 mg as needed for anxiety    . Cholecalciferol (VITAMIN D3) 5000 units CAPS Take 5,000 Units by mouth daily.    . collagenase (SANTYL) ointment Apply 1 application topically daily. (Patient taking differently: Apply 1 application topically daily as needed (skin sores). ) 30 g 0  . [employer] ALCOHOL SWABS PADS WIPE AREA WITH ALCOHOL PAD PRIOR TO GIVING METHOTREXATE INJECTION 100 each 0  . doxycycline (MONODOX) 100 MG capsule Take 100 mg by mouth 2 (two) times daily.  0  . DULoxetine (CYMBALTA) 60 MG capsule Take 60 mg by mouth daily.     . folic acid (FOLVITE) 1 MG tablet Take 2 tablets (2 mg total) by mouth daily. (Patient taking differently: Take 1 mg by mouth 2 (two) times daily. ) 180 tablet 4  . gabapentin (NEURONTIN) 300 MG capsule Take 600 mg by mouth 3 (three) times daily.     Marland Kitchen galantamine (RAZADYNE ER) 8 MG 24 hr capsule TAKE ONE EACH EVERY MORNING WITH FOOD (SHE FAILED ARICEPT DUE TO SIDE EFFECTS)  11  . hydrochlorothiazide 25 MG tablet Take 25 mg by mouth daily.      . hydroxychloroquine (PLAQUENIL) 200 MG tablet TAKE 1 TABLET BY MOUTH TWICE A DAY 60 tablet 0  . loperamide  (IMODIUM) 2 MG capsule Take 2 mg by mouth as needed for diarrhea or loose stools.    Marland Kitchen loratadine (CLARITIN) 10 MG tablet Take 10 mg by mouth daily.    . meloxicam (MOBIC) 15 MG tablet Take 15 mg by mouth daily.      . Menthol, Topical Analgesic, (BIOFREEZE EX) Apply 1 application topically 2 (two) times daily.    . methotrexate 50 MG/2ML injection Inject 0.39mL into the skin, once weekly. 10 mL 0  . montelukast (SINGULAIR) 10 MG tablet Take 10 mg by mouth daily.  11  . Multiple Vitamin (MULTIVITAMIN) tablet Take 1 tablet by mouth daily.      . Multiple Vitamins-Minerals (HAIR/SKIN/NAILS) TABS Take 1 tablet by mouth daily.    . nebivolol (BYSTOLIC) 10 MG tablet Take 10 mg by mouth daily.      Marland Kitchen olmesartan-hydrochlorothiazide (BENICAR HCT) 40-25 MG tablet TAKE 1 TABLET BY MOUTH EVERY DAY FOR BLOOD PRESSURE ( TO REPLACE BENAZAPRIL AND HCTZ)  5  . omeprazole (PRILOSEC) 40 MG capsule Take 1 capsule (40 mg total) by mouth 2 (two) times daily. 60 capsule 3  . OVER THE COUNTER MEDICATION Take 1 tablet by mouth 2 (two) times daily. Cherry Flex otc supplement    . OXYGEN Inhale 2 L into the lungs at bedtime as needed.    . Potassium 99 MG TABS Take 99 mg by mouth 2 (two) times daily.    . Probiotic CAPS Take 1 capsule by mouth daily.    . vitamin B-12 (CYANOCOBALAMIN) 1000 MCG tablet Take 1,000  mcg by mouth daily.    . vitamin E 400 UNIT capsule Take 400 Units by mouth daily.     No current facility-administered medications for this visit.     ROS:   General:  No weight loss, Fever, chills  HEENT: No recent headaches, no nasal bleeding, no visual changes, no sore throat  Neurologic: No dizziness, blackouts, seizures. No recent symptoms of stroke or mini- stroke. No recent episodes of slurred speech, or temporary blindness.  Cardiac: No recent episodes of chest pain/pressure, no shortness of breath at rest.  + shortness of breath with exertion.  Denies history of atrial fibrillation or irregular  heartbeat  Vascular: No history of rest pain in feet.  No history of claudication.  + history of non-healing ulcer, No history of DVT   Pulmonary: + home oxygen, no productive cough, no hemoptysis,  + asthma or wheezing  Musculoskeletal:  [X]  Arthritis, [X]  Low back pain,  [X]  Joint pain  Hematologic:No history of hypercoagulable state.  No history of easy bleeding.  No history of anemia  Gastrointestinal: No hematochezia or melena,  No gastroesophageal reflux, no trouble swallowing  Urinary: [ ]  chronic Kidney disease, [ ]  on HD - [ ]  MWF or [ ]  TTHS, [ ]  Burning with urination, [ ]  Frequent urination, [ ]  Difficulty urinating;   Skin: No rashes  Psychological: No history of anxiety,  No history of depression   Physical Examination  Vitals:   03/07/18 1520  BP: 139/81  Pulse: 67  Resp: 18  Temp: 98.4 F (36.9 C)  TempSrc: Oral  SpO2: 96%  Weight: 241 lb (109.3 kg)  Height: 5\' 4"  (1.626 m)    Body mass index is 41.37 kg/m.  General:  Alert and oriented, no acute distress HEENT: Normal Neck: No bruit or JVD Pulmonary: Clear to auscultation bilaterally Cardiac: Regular Rate and Rhythm without murmur Abdomen: Soft, non-tender, non-distended, no mass Skin: No rash, millimeter ulceration left posterior calf distal third of leg some granulation tissue at the base, thickened skin calf area bilaterally Extremity Pulses:  2+ radial, brachial, femoral  posterior tibial pulses bilaterally Musculoskeletal: No deformity pretibial edema 2+ extending from the knee down to the ankle slightly less in the foot  Neurologic: Upper and lower extremity motor 5/5 and symmetric  DATA:  Patient had a venous duplex exam yesterday showed no evidence of DVT.  It appears she has had previous ablation of her lesser and greater saphenous vein as these were not visualized.  ASSESSMENT: Recurrent ulceration left leg secondary to venous hypertension.  Patient has had previous laser ablation of her  lesser and greater saphenous so I do not believe that we have many interventions available for treatment of her superficial venous system.  I did discuss with her today continued compression therapy.  I gave her the option of potentially doing an Haematologist therapy for a month but she does not really want to proceed with this but due to a previous negative experience with her husband.  As far as treatment options I do not believe there are any interventions that would be possible to improve her overall symptoms.  Mainstay of therapy is going to be compression with intermittent treatment of her ulcerations.   PLAN: The patient will follow-up on an as-needed basis.  Please see recommendations above.   Ruta Hinds, MD [facility] Office: [phone] Pager: [phone]

## 2018-03-07 NOTE — Patient Instructions (Signed)
Standing Labs We placed an order today for your standing lab work.    Please come back and get your standing labs in September and every 3 months   We have open lab Monday through Friday from 8:30-11:30 AM and 1:30-4:00 PM  at the office of Dr. Macyn Remmert.   You may experience shorter wait times on Monday and Friday afternoons. The office is located at [address], [address], [place], [place] [address] No appointment is necessary.   Labs are drawn by [facility].  You may receive a bill from [facility] for your lab work. If you have any questions regarding directions or hours of operation,  please call [phone].    

## 2018-03-15 ENCOUNTER — Encounter: Payer: Self-pay | Admitting: Rheumatology

## 2018-03-15 DIAGNOSIS — Z79899 Other long term (current) drug therapy: Secondary | ICD-10-CM | POA: Diagnosis not present

## 2018-03-15 DIAGNOSIS — E119 Type 2 diabetes mellitus without complications: Secondary | ICD-10-CM | POA: Diagnosis not present

## 2018-03-15 DIAGNOSIS — H04123 Dry eye syndrome of bilateral lacrimal glands: Secondary | ICD-10-CM | POA: Diagnosis not present

## 2018-03-15 LAB — HM DIABETES EYE EXAM

## 2018-03-19 ENCOUNTER — Encounter (INDEPENDENT_AMBULATORY_CARE_PROVIDER_SITE_OTHER): Payer: Self-pay | Admitting: Orthopedic Surgery

## 2018-03-19 ENCOUNTER — Ambulatory Visit (INDEPENDENT_AMBULATORY_CARE_PROVIDER_SITE_OTHER): Payer: Medicare HMO | Admitting: Orthopedic Surgery

## 2018-03-19 DIAGNOSIS — M25511 Pain in right shoulder: Secondary | ICD-10-CM | POA: Diagnosis not present

## 2018-03-19 DIAGNOSIS — G8929 Other chronic pain: Secondary | ICD-10-CM | POA: Diagnosis not present

## 2018-03-21 NOTE — Progress Notes (Signed)
Office Visit Note   Patient: [name]           Date of Birth: Feb 05, 1946           MRN: [id number] Visit Date: 03/19/2018 Requested by: Crist Infante, MD [address] [place], [place] [address] PCP: Crist Infante, MD  Subjective: Chief Complaint  Patient presents with  . Right Shoulder - Pain    HPI: [name] is a patient with right shoulder pain.  Patient had an injection 02/20/18.  Reports chronic right shoulder pain.  She had arthroscopy with distal clavicle excision in August 2018.  She is her to discuss surgical options.  She has rheumatoid arthritis and osteoarthritis.  She takes Plaquenil and methotrexate.  She states that the shoulder catches.  She has some pain which radiates down the arm to the fingers.  She states she has some "problems in her neck".  She cannot really do much of anything with that right shoulder.  She has a trip to the beach occurring in November.  Outside radiographs are reviewed and are consistent with rotator cuff arthropathy possible os acriomale.                ROS: All systems reviewed are negative as they relate to the chief complaint within the history of present illness.   Assessment & Plan: Visit Diagnoses:  1. Chronic right shoulder pain     Plan: Impression is right shoulder rotator cuff arthropathy with diminished shoulder function in patient who has rheumatoid arthritis.  She is at increased surgical risk for infections as well as implant loosening due to bone quality.  Discussed with her the risk and benefits of reverse shoulder replacement.  They include but not limited to infection, nerve vessel damage, dislocation, implant loosening.  I will see her back in mid October so we can discuss options further.  I would want to get CT scan for patient specific instrumentation and planning.    Follow-Up Instructions: No follow-ups on file.   Orders:  No orders of the defined types were placed in this encounter.  No orders of the defined types  were placed in this encounter.     Procedures: No procedures performed   Clinical Data: No additional findings.  Objective: Vital Signs: There were no vitals taken for this visit.  Physical Exam:   Constitutional: Patient appears well developed.  HEENT: Head: Normocephalic Eyes: EOM are normal Neck: Normal range of motion Cardiovascular: Normal rate Pulmonary/chest: Effort normal Neurologic: Patient is alert Skin: Skin is warm Psychiatric: Patient has normal mood and affect  Ortho Exam: Ortho exam demonstrates pretty reasonable cervical spine range of motion with 5 out of 5 grip EPL FPL interosseous wrist flexion extension bicep triceps and deltoid strength.  Patient has less than 90 degrees of forward flexion and adbuction on the right hand side.  She has diminished infraspinatus and supraspinatus strength testing.  Subscap strength seems intact.  Passive range of motion is pretty easy to 170/90 degrees of adbuction.    Specialty Comments:  No specialty comments available.  Imaging: No results found.   PMFS History: Patient Active Problem List   Diagnosis Date Noted  . Diarrhea   . Benign neoplasm of cecum   . Abdominal pain, epigastric   . Gastroesophageal reflux disease   . Dependence on nocturnal oxygen therapy 07/10/2017  . Open wound of left lower leg 12/26/2016  . Vitamin D deficiency 09/14/2016  . History of total knee replacement, bilateral 09/14/2016  .  DJD (degenerative joint disease), cervical 09/14/2016  . 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 High risk medication use 05/20/2016  . Spinal stenosis, lumbar region, with neurogenic claudication 09/08/2015    Class: Chronic  . OSA (obstructive sleep apnea) 10/17/2013  . Dyspnea 09/04/2013  . Atypical chest pain 09/04/2013  . Morbid obesity ([place]) 03/21/2012  . Cancer of lower-outer quadrant of female breast ([place]) 03/15/2012  . Diabetes mellitus type II  02/22/2011  . Coronary artery disease   . Hypertension   . Hyperlipidemia   . OA (osteoarthritis) of knee    Past Medical History:  Diagnosis Date  . Asthma    Albuterol inhaler as needed.Pulmicort neb as needed  . Asthma   . Breast cancer ([place])    right - lumpectomy   . Chronic back pain    spinal stenosis  . Coronary artery disease   . Dependence on nocturnal oxygen therapy 07/10/2017   Pt uses 2 liters 02 at night   . Depression    takes CYmbalta and Wellbutrin daily  . Diabetes mellitus    not on any meds/controlled by diet  . Dyspnea    with exertion occasionally when lies down  . GERD (gastroesophageal reflux disease)    takes Omeprazole daily  . Headache    oocasionally  . History of kidney stones   . Hyperlipidemia    not on any meds  . Hypertension    takes Benazepril,Bystolic,and Amlodipine daily  . IBS (irritable bowel syndrome)   . Joint pain   . Joint swelling   . Nocturia   . OA (osteoarthritis) of knee   . OSA (obstructive sleep apnea)   . Oxygen deficiency    2 liters at night per pt for OSA- no cpap   . Peripheral edema    takes daily as needed  . Peripheral neuropathy   . Personal history of chemotherapy   . Personal history of radiation therapy   . Pneumonia    hx of-several yrs ago  . RA (rheumatoid arthritis) ([place])   . Sleep apnea    pt states she uses oxygen at night - no cpap- uses 02 2 liters at night   . Urinary frequency   . Urinary urgency   . Weakness    numbness and tingling mainly in left leg occasionally in right.Tingling/numbness in hands    Family History  Problem Relation Age of Onset  . Heart disease Mother   . Cancer Father   . Cancer Brother   . Heart disease Brother     Past Surgical History:  Procedure Laterality Date  . BREAST LUMPECTOMY  1997   RIGHT BREAST  . CARDIAC CATHETERIZATION  04/23/2004   EF 60%  . cataract surgery Bilateral   . COLONOSCOPY    . COLONOSCOPY WITH PROPOFOL N/A 08/07/2017   Procedure:  COLONOSCOPY WITH PROPOFOL;  Surgeon: Ladene Artist, MD;  [facility];  Service: Endoscopy;  Laterality: N/A;  . CORONARY ARTERY BYPASS GRAFT  2005   LIMA GRAFT TO LAD, SAPHENOUS VEIN GRAFT TO THE FIRST DIAGONAL, AND LEFT RADIAL ARTERY GRAFT TO THE OM  . ESOPHAGOGASTRODUODENOSCOPY (EGD) WITH PROPOFOL N/A 08/07/2017   Procedure: ESOPHAGOGASTRODUODENOSCOPY (EGD) WITH PROPOFOL;  Surgeon: Ladene Artist, MD;  [facility];  Service: Endoscopy;  Laterality: N/A;  . FOOT SURGERY Right   . JOINT REPLACEMENT    . LITHOTRIPSY    . LUMBAR LAMINECTOMY/DECOMPRESSION MICRODISCECTOMY N/A 09/08/2015   Procedure: CENTRAL  DECOMPRESSIVE LUMBAR LAMINECTOMIES L3-4, L4-5 AND BILATERAL HEMILAMINECTOMY L5-S1;  Surgeon: Jessy Oto, MD;  Location: [facility];  Service: Orthopedics;  Laterality: N/A;  . nodule removed from left elbow    . SHOULDER ARTHROSCOPY    . TOTAL KNEE ARTHROPLASTY Bilateral   . TOTAL SHOULDER ARTHROPLASTY    . TRIPLE BYPASS  04/27/05  . US ECHOCARDIOGRAPHY  01/06/2009   EF 55-60%  . WRIST SURGERY Left    Social History   Occupational History  . Occupation: retired    Fish farm manager: UNEMPLOYED  Tobacco Use  . Smoking status: Never Smoker  . Smokeless tobacco: Never Used  Substance and Sexual Activity  . Alcohol use: No    Alcohol/week: 0.0 standard drinks  . Drug use: No  . Sexual activity: Not on file

## 2018-04-11 DIAGNOSIS — J4520 Mild intermittent asthma, uncomplicated: Secondary | ICD-10-CM | POA: Diagnosis not present

## 2018-04-18 ENCOUNTER — Telehealth: Payer: Self-pay | Admitting: Rheumatology

## 2018-04-18 NOTE — Telephone Encounter (Signed)
Patient left a voicemail requesting prescription refill of Plaquenil to be sent to [facility] [address] in [place].

## 2018-04-19 MED ORDER — HYDROXYCHLOROQUINE SULFATE 200 MG PO TABS: 200.0000 mg | ORAL_TABLET | Freq: Two times a day (BID) | ORAL | 0 refills | Status: DC

## 2018-04-19 NOTE — Telephone Encounter (Signed)
Last Visit: 03/07/18 Next Visit: 08/07/18 Labs: 01/05/19 elevated glucose  PLQ Eye Exam: 03/15/18 WNL  Okay to refill per Dr. Estanislado Pandy

## 2018-04-25 ENCOUNTER — Other Ambulatory Visit: Payer: Self-pay | Admitting: *Deleted

## 2018-04-25 MED ORDER — FOLIC ACID 1 MG PO TABS: 2.0000 mg | ORAL_TABLET | Freq: Every day | ORAL | 4 refills | Status: AC

## 2018-04-25 NOTE — Telephone Encounter (Signed)
Refill request received via fax  Last Visit: 03/07/18 Next Visit: 08/07/18  Okay to refill per Dr. Estanislado Pandy

## 2018-04-27 DIAGNOSIS — E1151 Type 2 diabetes mellitus with diabetic peripheral angiopathy without gangrene: Secondary | ICD-10-CM | POA: Diagnosis not present

## 2018-04-27 DIAGNOSIS — Z23 Encounter for immunization: Secondary | ICD-10-CM | POA: Diagnosis not present

## 2018-04-27 DIAGNOSIS — C50919 Malignant neoplasm of unspecified site of unspecified female breast: Secondary | ICD-10-CM | POA: Diagnosis not present

## 2018-04-27 DIAGNOSIS — S81802A Unspecified open wound, left lower leg, initial encounter: Secondary | ICD-10-CM | POA: Diagnosis not present

## 2018-04-27 DIAGNOSIS — I2510 Atherosclerotic heart disease of native coronary artery without angina pectoris: Secondary | ICD-10-CM | POA: Diagnosis not present

## 2018-04-27 DIAGNOSIS — G4733 Obstructive sleep apnea (adult) (pediatric): Secondary | ICD-10-CM | POA: Diagnosis not present

## 2018-04-27 DIAGNOSIS — I10 Essential (primary) hypertension: Secondary | ICD-10-CM | POA: Diagnosis not present

## 2018-04-27 DIAGNOSIS — J45998 Other asthma: Secondary | ICD-10-CM | POA: Diagnosis not present

## 2018-04-27 DIAGNOSIS — F3289 Other specified depressive episodes: Secondary | ICD-10-CM | POA: Diagnosis not present

## 2018-04-27 DIAGNOSIS — M069 Rheumatoid arthritis, unspecified: Secondary | ICD-10-CM | POA: Diagnosis not present

## 2018-04-30 ENCOUNTER — Telehealth: Payer: Self-pay | Admitting: Rheumatology

## 2018-04-30 NOTE — Telephone Encounter (Signed)
Patient left a voicemail stating she was returning Andrea's call.

## 2018-05-01 NOTE — Telephone Encounter (Signed)
Attempted to contact the patient and left message for patient to call the office.  

## 2018-05-01 NOTE — Telephone Encounter (Signed)
Only medications which will suppress her immune system will interfere with the wound healing.  She may discontinue methotrexate.  She can continue Plaquenil.

## 2018-05-01 NOTE — Telephone Encounter (Signed)
Patient states she has been on the MTX for some time and has to go off of it for some time due to an ulcer because of an ulcer on her leg. Patient states she has another ulcer on her leg. Patient states she has seen Dr. Joylene Draft for the ulcer and he advised her to check with [place] to see if there is anything that she can take for her arthritis other than the MTX. Patient states she is having pain in her hands, shoulders and her back, legs and feet. Please advise.

## 2018-05-02 NOTE — Telephone Encounter (Signed)
Patient advised that the medications we give will suppress her immune system and interfere with the wound healing process. Patient has been off the MTX and is going to stay off the MTX. Patient advised to continue PLQ. Patient verbalized understanding.

## 2018-05-11 DIAGNOSIS — J4520 Mild intermittent asthma, uncomplicated: Secondary | ICD-10-CM | POA: Diagnosis not present

## 2018-05-18 ENCOUNTER — Ambulatory Visit (INDEPENDENT_AMBULATORY_CARE_PROVIDER_SITE_OTHER): Payer: Medicare HMO | Admitting: Orthopedic Surgery

## 2018-05-18 ENCOUNTER — Encounter (INDEPENDENT_AMBULATORY_CARE_PROVIDER_SITE_OTHER): Payer: Self-pay | Admitting: Orthopedic Surgery

## 2018-05-18 DIAGNOSIS — M19011 Primary osteoarthritis, right shoulder: Secondary | ICD-10-CM

## 2018-05-21 ENCOUNTER — Encounter (INDEPENDENT_AMBULATORY_CARE_PROVIDER_SITE_OTHER): Payer: Self-pay | Admitting: Orthopedic Surgery

## 2018-05-21 DIAGNOSIS — M19011 Primary osteoarthritis, right shoulder: Secondary | ICD-10-CM | POA: Diagnosis not present

## 2018-05-21 MED ORDER — LIDOCAINE HCL 1 % IJ SOLN
5.0000 mL | INTRAMUSCULAR | Status: AC | PRN
  Administered 2018-05-21: 5 mL

## 2018-05-21 MED ORDER — METHYLPREDNISOLONE ACETATE 40 MG/ML IJ SUSP
40.0000 mg | INTRAMUSCULAR | Status: AC | PRN
  Administered 2018-05-21: 40 mg via INTRA_ARTICULAR

## 2018-05-21 MED ORDER — BUPIVACAINE HCL 0.5 % IJ SOLN
9.0000 mL | INTRAMUSCULAR | Status: AC | PRN
  Administered 2018-05-21: 9 mL via INTRA_ARTICULAR

## 2018-05-21 NOTE — Progress Notes (Signed)
Office Visit Note   Patient: [name]           Date of Birth: 07/14/1946           MRN: [id number] Visit Date: 05/18/2018 Requested by: Crist Infante, MD [address] [place], [place] [address] PCP: Crist Infante, MD  Subjective: Chief Complaint  Patient presents with  . Right Shoulder - Follow-up    HPI: [name] presents for follow-up of right shoulder.  She had right shoulder arthroscopy and distal clavicle excision in 2018.  She is on medications for rheumatoid arthritis but not methotrexate because of ulceration.  The pain does not wake her from sleep.  She also has diabetes.  She last had an injection and office visit from July.  She states in general she would like to try to manage the pain currently.  She does not really want to undergo a surgical procedure.  Her medical comorbidities would put her in a higher risk category for complications particularly infection.              ROS: All systems reviewed are negative as they relate to the chief complaint within the history of present illness.  Patient denies  fevers or chills.   Assessment & Plan: Visit Diagnoses:  1. Primary osteoarthritis, right shoulder     Plan: Impression is right shoulder pain and arthritis.  Plan is intra-articular shoulder joint injection today.  We will canceled the NCAT CT scan and schedule that when she is moving in a direction closer towards desiring surgical treatment for this problem.  Continue with range of motion exercises but try to avoid overhead lifting if possible.  Follow-up with me as needed.  Follow-Up Instructions: Return if symptoms worsen or fail to improve.   Orders:  No orders of the defined types were placed in this encounter.  No orders of the defined types were placed in this encounter.     Procedures: Large Joint Inj: R glenohumeral on 05/21/2018 9:29 PM Indications: diagnostic evaluation and pain Details: 18 G 1.5 in needle, posterior approach  Arthrogram:  No  Medications: 9 mL bupivacaine 0.5 %; 40 mg methylPREDNISolone acetate 40 MG/ML; 5 mL lidocaine 1 % Outcome: tolerated well, no immediate complications Procedure, treatment alternatives, risks and benefits explained, specific risks discussed. Consent was given by the patient. Immediately prior to procedure a time out was called to verify the correct patient, procedure, equipment, support staff and site/side marked as required. Patient was prepped and draped in the usual sterile fashion.       Clinical Data: No additional findings.  Objective: Vital Signs: There were no vitals taken for this visit.  Physical Exam:   Constitutional: Patient appears well-developed HEENT:  Head: Normocephalic Eyes:EOM are normal Neck: Normal range of motion Cardiovascular: Normal rate Pulmonary/chest: Effort normal Neurologic: Patient is alert Skin: Skin is warm Psychiatric: Patient has normal mood and affect    Ortho Exam: Ortho exam demonstrates full active and passive range of motion of the elbows and wrist.  Right shoulder does have limited range of motion to slightly around 90 degrees of abduction and forward flexion.  Cuff strength is reasonable with coarse grinding present consistent with bone-on-bone arthritis.  Skin in the right shoulder region is intact.  Radial pulses intact.  Specialty Comments:  No specialty comments available.  Imaging: No results found.   PMFS History: Patient Active Problem List   Diagnosis Date Noted  . Diarrhea   . Benign neoplasm of cecum   .  Abdominal pain, epigastric   . Gastroesophageal reflux disease   . Dependence on nocturnal oxygen therapy 07/10/2017  . Open wound of left lower leg 12/26/2016  . Vitamin D deficiency 09/14/2016  . History of total knee replacement, bilateral 09/14/2016  . DJD (degenerative joint disease), cervical 09/14/2016  . 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 High risk  medication use 05/20/2016  . Spinal stenosis, lumbar region, with neurogenic claudication 09/08/2015    Class: Chronic  . OSA (obstructive sleep apnea) 10/17/2013  . Dyspnea 09/04/2013  . Atypical chest pain 09/04/2013  . Morbid obesity ([facility]) 03/21/2012  . Cancer of lower-outer quadrant of female breast ([place]) 03/15/2012  . Diabetes mellitus type II 02/22/2011  . Coronary artery disease   . Hypertension   . Hyperlipidemia   . OA (osteoarthritis) of knee    Past Medical History:  Diagnosis Date  . Asthma    Albuterol inhaler as needed.Pulmicort neb as needed  . Asthma   . Breast cancer ([facility])    right - lumpectomy   . Chronic back pain    spinal stenosis  . Coronary artery disease   . Dependence on nocturnal oxygen therapy 07/10/2017   Pt uses 2 liters 02 at night   . Depression    takes CYmbalta and Wellbutrin daily  . Diabetes mellitus    not on any meds/controlled by diet  . Dyspnea    with exertion occasionally when lies down  . GERD (gastroesophageal reflux disease)    takes Omeprazole daily  . Headache    oocasionally  . History of kidney stones   . Hyperlipidemia    not on any meds  . Hypertension    takes Benazepril,Bystolic,and Amlodipine daily  . IBS (irritable bowel syndrome)   . Joint pain   . Joint swelling   . Nocturia   . OA (osteoarthritis) of knee   . OSA (obstructive sleep apnea)   . Oxygen deficiency    2 liters at night per pt for OSA- no cpap   . Peripheral edema    takes daily as needed  . Peripheral neuropathy   . Personal history of chemotherapy   . Personal history of radiation therapy   . Pneumonia    hx of-several yrs ago  . RA (rheumatoid arthritis) ([facility])   . Sleep apnea    pt states she uses oxygen at night - no cpap- uses 02 2 liters at night   . Urinary frequency   . Urinary urgency   . Weakness    numbness and tingling mainly in left leg occasionally in right.Tingling/numbness in hands    Family History  Problem Relation  Age of Onset  . Heart disease Mother   . Cancer Father   . Cancer Brother   . Heart disease Brother     Past Surgical History:  Procedure Laterality Date  . BREAST LUMPECTOMY  1997   RIGHT BREAST  . CARDIAC CATHETERIZATION  04/23/2004   EF 60%  . cataract surgery Bilateral   . COLONOSCOPY    . COLONOSCOPY WITH PROPOFOL N/A 08/07/2017   Procedure: COLONOSCOPY WITH PROPOFOL;  Surgeon: Ladene Artist, MD;  Location: [facility];  Service: Endoscopy;  Laterality: N/A;  . CORONARY ARTERY BYPASS GRAFT  2005   LIMA GRAFT TO LAD, SAPHENOUS VEIN GRAFT TO THE FIRST DIAGONAL, AND LEFT RADIAL ARTERY GRAFT TO THE OM  . ESOPHAGOGASTRODUODENOSCOPY (EGD) WITH PROPOFOL N/A 08/07/2017   Procedure: ESOPHAGOGASTRODUODENOSCOPY (EGD) WITH PROPOFOL;  Surgeon: Ladene Artist, MD;  Location: [facility];  Service: Endoscopy;  Laterality: N/A;  . FOOT SURGERY Right   . JOINT REPLACEMENT    . LITHOTRIPSY    . LUMBAR LAMINECTOMY/DECOMPRESSION MICRODISCECTOMY N/A 09/08/2015   Procedure: CENTRAL DECOMPRESSIVE LUMBAR LAMINECTOMIES L3-4, L4-5 AND BILATERAL HEMILAMINECTOMY L5-S1;  Surgeon: Jessy Oto, MD;  Location: [facility];  Service: Orthopedics;  Laterality: N/A;  . nodule removed from left elbow    . SHOULDER ARTHROSCOPY    . TOTAL KNEE ARTHROPLASTY Bilateral   . TOTAL SHOULDER ARTHROPLASTY    . TRIPLE BYPASS  04/27/05  . US ECHOCARDIOGRAPHY  01/06/2009   EF 55-60%  . WRIST SURGERY Left    Social History   Occupational History  . Occupation: retired    Fish farm manager: UNEMPLOYED  Tobacco Use  . Smoking status: Never Smoker  . Smokeless tobacco: Never Used  Substance and Sexual Activity  . Alcohol use: No    Alcohol/week: 0.0 standard drinks  . Drug use: No  . Sexual activity: Not on file

## 2018-05-31 ENCOUNTER — Other Ambulatory Visit (INDEPENDENT_AMBULATORY_CARE_PROVIDER_SITE_OTHER): Payer: Self-pay | Admitting: Orthopaedic Surgery

## 2018-06-11 DIAGNOSIS — J4520 Mild intermittent asthma, uncomplicated: Secondary | ICD-10-CM | POA: Diagnosis not present

## 2018-07-11 DIAGNOSIS — J4520 Mild intermittent asthma, uncomplicated: Secondary | ICD-10-CM | POA: Diagnosis not present

## 2018-07-13 ENCOUNTER — Other Ambulatory Visit: Payer: Self-pay | Admitting: Rheumatology

## 2018-07-13 NOTE — Telephone Encounter (Signed)
Last Visit: 03/07/18 Next Visit: 08/07/18 Labs: 01/04/18 Elevated glucose PLQ Eye Exam: 03/15/18 WNL   Okay to refill per Dr. Estanislado Pandy

## 2018-07-24 NOTE — Progress Notes (Signed)
Office Visit Note  Patient: [name]             Date of Birth: 18-Feb-1946           MRN: [id number]             PCP: Crist Infante, MD Referring: Crist Infante, MD Visit Date: 08/07/2018 Occupation: @GUAROCC @  Subjective:  Increased joint pain.    History of Present Illness: [name] is a 72 y.o. female history of seronegative rheumatoid arthritis and osteoarthritis overlap.  She states she has been off methotrexate July 2019 due to lower extremity infected wound.  She states the wound gradually healing.  She has another not coming up.  Been experiencing increased joint and discomfort since she has been off methotrexate.  She has been taking Plaquenil twice a day.  She has been experiencing increased pain in her both hands, both feet and her left shoulder.  Activities of Daily Living:  Patient reports morning stiffness for 2 hours.   Patient Denies nocturnal pain.  Difficulty dressing/grooming: Denies Difficulty climbing stairs: Reports Difficulty getting out of chair: Reports Difficulty using hands for taps, buttons, cutlery, and/or writing: Reports  Review of Systems  Constitutional: Positive for fatigue. Negative for night sweats, weight gain and weight loss.  HENT: Positive for mouth dryness. Negative for mouth sores, trouble swallowing, trouble swallowing and nose dryness.   Eyes: Negative for pain, redness, visual disturbance and dryness.  Respiratory: Negative for cough, shortness of breath and difficulty breathing.   Cardiovascular: Negative for chest pain, palpitations, hypertension, irregular heartbeat and swelling in legs/feet.  Gastrointestinal: Positive for diarrhea. Negative for blood in stool and constipation.  Endocrine: Negative for increased urination.  Genitourinary: Negative for vaginal dryness.  Musculoskeletal: Positive for arthralgias, joint pain, joint swelling, myalgias, morning stiffness and myalgias. Negative for muscle weakness and muscle  tenderness.  Skin: Negative for color change, rash, hair loss, skin tightness, ulcers and sensitivity to sunlight.  Allergic/Immunologic: Negative for susceptible to infections.  Neurological: Negative for dizziness, memory loss, night sweats and weakness.  Hematological: Negative for swollen glands.  Psychiatric/Behavioral: Negative for depressed mood and sleep disturbance. The patient is not nervous/anxious.     PMFS History:  Patient Active Problem List   Diagnosis Date Noted  . Diarrhea   . Benign neoplasm of cecum   . Abdominal pain, epigastric   . Gastroesophageal reflux disease   . Dependence on nocturnal oxygen therapy 07/10/2017  . Open wound of left lower leg 12/26/2016  . Vitamin D deficiency 09/14/2016  . History of total knee replacement, bilateral 09/14/2016  . DJD (degenerative joint disease), cervical 09/14/2016  . 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 High risk medication use 05/20/2016  . Spinal stenosis, lumbar region, with neurogenic claudication 09/08/2015    Class: Chronic  . OSA (obstructive sleep apnea) 10/17/2013  . Dyspnea 09/04/2013  . Atypical chest pain 09/04/2013  . Morbid obesity ([facility]) 03/21/2012  . Cancer of lower-outer quadrant of female breast ([facility]) 03/15/2012  . Diabetes mellitus type II 02/22/2011  . Coronary artery disease   . Hypertension   . Hyperlipidemia   . OA (osteoarthritis) of knee     Past Medical History:  Diagnosis Date  . Asthma    Albuterol inhaler as needed.Pulmicort neb as needed  . Asthma   . Breast cancer ([place] [facility])    right - lumpectomy   . Chronic back pain    spinal stenosis  . Coronary  artery disease   . Dependence on nocturnal oxygen therapy 07/10/2017   Pt uses 2 liters 02 at night   . Depression    takes CYmbalta and Wellbutrin daily  . Diabetes mellitus    not on any meds/controlled by diet  . Dyspnea    with exertion occasionally when lies down  . GERD  (gastroesophageal reflux disease)    takes Omeprazole daily  . Headache    oocasionally  . History of kidney stones   . Hyperlipidemia    not on any meds  . Hypertension    takes Benazepril,Bystolic,and Amlodipine daily  . IBS (irritable bowel syndrome)   . Joint pain   . Joint swelling   . Nocturia   . OA (osteoarthritis) of knee   . OSA (obstructive sleep apnea)   . Oxygen deficiency    2 liters at night per pt for OSA- no cpap   . Peripheral edema    takes daily as needed  . Peripheral neuropathy   . Personal history of chemotherapy   . Personal history of radiation therapy   . Pneumonia    hx of-several yrs ago  . RA (rheumatoid arthritis) ([facility])   . Sleep apnea    pt states she uses oxygen at night - no cpap- uses 02 2 liters at night   . Urinary frequency   . Urinary urgency   . Weakness    numbness and tingling mainly in left leg occasionally in right.Tingling/numbness in hands    Family History  Problem Relation Age of Onset  . Heart disease Mother   . Cancer Father   . Cancer Brother   . Heart disease Brother    Past Surgical History:  Procedure Laterality Date  . BREAST LUMPECTOMY  1997   RIGHT BREAST  . CARDIAC CATHETERIZATION  04/23/2004   EF 60%  . cataract surgery Bilateral   . COLONOSCOPY    . COLONOSCOPY WITH PROPOFOL N/A 08/07/2017   Procedure: COLONOSCOPY WITH PROPOFOL;  Surgeon: Ladene Artist, MD;  Location: [facility];  Service: Endoscopy;  Laterality: N/A;  . CORONARY ARTERY BYPASS GRAFT  2005   LIMA GRAFT TO LAD, SAPHENOUS VEIN GRAFT TO THE FIRST DIAGONAL, AND LEFT RADIAL ARTERY GRAFT TO THE OM  . ESOPHAGOGASTRODUODENOSCOPY (EGD) WITH PROPOFOL N/A 08/07/2017   Procedure: ESOPHAGOGASTRODUODENOSCOPY (EGD) WITH PROPOFOL;  Surgeon: Ladene Artist, MD;  [facility];  Service: Endoscopy;  Laterality: N/A;  . FOOT SURGERY Right   . JOINT REPLACEMENT    . LITHOTRIPSY    . LUMBAR LAMINECTOMY/DECOMPRESSION MICRODISCECTOMY N/A 09/08/2015    Procedure: CENTRAL DECOMPRESSIVE LUMBAR LAMINECTOMIES L3-4, L4-5 AND BILATERAL HEMILAMINECTOMY L5-S1;  Surgeon: Jessy Oto, MD;  Location: [facility];  Service: Orthopedics;  Laterality: N/A;  . nodule removed from left elbow    . SHOULDER ARTHROSCOPY    . TOTAL KNEE ARTHROPLASTY Bilateral   . TOTAL SHOULDER ARTHROPLASTY    . TRIPLE BYPASS  04/27/05  . US ECHOCARDIOGRAPHY  01/06/2009   EF 55-60%  . WRIST SURGERY Left    Social History   Social History Narrative  . Not on file   Immunization History  Administered Date(s) Administered  . Influenza Split 04/24/2013  . Zoster Recombinat (Shingrix) 10/11/2017, 12/22/2017   Objective: Vital Signs: BP (!) 146/83 (BP Location: Left Arm, Patient Position: Sitting, Cuff Size: Large)   Pulse 65   Resp 15   Ht 5\' 4"  (1.626 m)   Wt 246 lb (111.6 kg)  BMI 42.23 kg/m    Physical Exam Vitals signs and nursing note reviewed.  Constitutional:      Appearance: She is well-developed.  HENT:     Head: Normocephalic and atraumatic.  Eyes:     Conjunctiva/sclera: Conjunctivae normal.  Neck:     Musculoskeletal: Normal range of motion.  Cardiovascular:     Rate and Rhythm: Normal rate and regular rhythm.     Heart sounds: Normal heart sounds.  Pulmonary:     Effort: Pulmonary effort is normal.     Breath sounds: Normal breath sounds.  Abdominal:     General: Bowel sounds are normal.     Palpations: Abdomen is soft.  Lymphadenopathy:     Cervical: No cervical adenopathy.  Skin:    General: Skin is warm and dry.     Capillary Refill: Capillary refill takes less than 2 seconds.     Comments: Patient is significant bilateral pitting edema and varicose veins.  She has healing ulcer on her right lower extremity.    Neurological:     Mental Status: She is alert and oriented to person, place, and time.  Psychiatric:        Behavior: Behavior normal.      Musculoskeletal Exam: C-spine thoracic lumbar spine limited range of motion.  She  has limited range of motion of bilateral shoulder joints.  She has no synovitis of her wrist joint MCPs or PIPs.  She has thickening of PIP and DIP joints in her hands and feet due to osteoarthritis.  Hip joints were in good range of motion.  She has bilateral total knee replacement which is doing well.  CDAI Exam: CDAI Score: 0.8  Patient Global Assessment: 4 (mm); Provider Global Assessment: 4 (mm) Swollen: 0 ; Tender: 0  Joint Exam   Not documented   There is currently no information documented on the homunculus. Go to the Rheumatology activity and complete the homunculus joint exam.  Investigation: No additional findings.  Imaging: No results found.  Recent Labs: Lab Results  Component Value Date   WBC 7.4 01/04/2018   HGB 13.7 01/04/2018   PLT 160 01/04/2018   NA 139 01/04/2018   K 3.7 01/04/2018   CL 101 01/04/2018   CO2 28 01/04/2018   GLUCOSE 182 (H) 01/04/2018   BUN 14 01/04/2018   CREATININE 0.70 01/04/2018   BILITOT 0.4 01/04/2018   ALKPHOS 63 01/04/2018   AST 27 01/04/2018   ALT 31 01/04/2018   PROT 7.4 01/04/2018   ALBUMIN 4.3 01/04/2018   CALCIUM 9.5 01/04/2018   GFRAA >60 01/04/2018    Speciality Comments: PLQ Eye Exam: 03/15/18 WNL @ [facility] follow up in 6 months.   Procedures:  No procedures performed Allergies: Statins   Assessment / Plan:     Visit Diagnoses: Rheumatoid arthritis of multiple sites with negative rheumatoid factor (HCC)-patient has no active synovitis.  She has some synovial thickening and some discomfort due to underlying osteoarthritis.  I would like to hold off methotrexate.  Methotrexate was discontinued in July due to nonhealing ulcer.  I discouraged use of more aggressive immunosuppression at this point.  She will continue on Plaquenil for now.  If healing of the ulcer becomes an issue we can even hold off Plaquenil.  High risk medication use - PLQ200mg  po bid, MTX was discontinued in July 2019 due to lower  extremity ulcers.eye exam: 03/15/2018 - Plan: CBC with Differential/Platelet, COMPLETE METABOLIC PANEL WITH GFR, CBC with Differential/Platelet, COMPLETE METABOLIC  PANEL WITH GFR today and then every 5 months to monitor for drug toxicity.  Ulcer of left lower extremity, limited to breakdown of skin (HCC)-the ulcer on her left lower extremity is a still in the process of healing.  She also has few areas which can turn into ulceration.  She has been wearing compression hose.  I discussed referral to wound clinic but she declined.  She states Dr. Vic Ripper following her very closely.  She may benefit from dermatology evaluation as well.  Pedal edema-she has significant pedal edema on bilateral lower extremities.  She has been using compression hose.  History of total knee replacement, bilateral-doing well  Varicose veins of bilateral lower extremities with other complications-patient has seen vascular surgeon in the past.  DDD (degenerative disc disease), cervical-she has limited range of motion.  DDD (degenerative disc disease), lumbar - status post fusion.  She has chronic pain.  Other medical problems are listed as follows:  History of hypertension-her blood pressure is elevated today.  Have advised her to monitor blood pressure closely.  History of vitamin D deficiency  History of diabetes mellitus  History of breast cancer  History of obesity  History of coronary artery disease  History of sleep apnea   Orders: Orders Placed This Encounter  Procedures  . CBC with Differential/Platelet  . COMPLETE METABOLIC PANEL WITH GFR  . CBC with Differential/Platelet  . COMPLETE METABOLIC PANEL WITH GFR   No orders of the defined types were placed in this encounter.   Face-to-face time spent with patient was 30 minutes. Greater than 50% of time was spent in counseling and coordination of care.  Follow-Up Instructions: Return in about 5 months (around 01/06/2019) for Rheumatoid arthritis,  Osteoarthritis.   Bo Merino, MD  Note - This record has been created using Editor, commissioning.  Chart creation errors have been sought, but may not always  have been located. Such creation errors do not reflect on  the standard of medical care.

## 2018-08-01 DIAGNOSIS — E1151 Type 2 diabetes mellitus with diabetic peripheral angiopathy without gangrene: Secondary | ICD-10-CM | POA: Diagnosis not present

## 2018-08-01 DIAGNOSIS — F3289 Other specified depressive episodes: Secondary | ICD-10-CM | POA: Diagnosis not present

## 2018-08-01 DIAGNOSIS — R1084 Generalized abdominal pain: Secondary | ICD-10-CM | POA: Diagnosis not present

## 2018-08-01 DIAGNOSIS — Z6841 Body Mass Index (BMI) 40.0 and over, adult: Secondary | ICD-10-CM | POA: Diagnosis not present

## 2018-08-01 DIAGNOSIS — R82998 Other abnormal findings in urine: Secondary | ICD-10-CM | POA: Diagnosis not present

## 2018-08-01 DIAGNOSIS — R413 Other amnesia: Secondary | ICD-10-CM | POA: Diagnosis not present

## 2018-08-01 DIAGNOSIS — I10 Essential (primary) hypertension: Secondary | ICD-10-CM | POA: Diagnosis not present

## 2018-08-01 DIAGNOSIS — K219 Gastro-esophageal reflux disease without esophagitis: Secondary | ICD-10-CM | POA: Diagnosis not present

## 2018-08-01 DIAGNOSIS — M069 Rheumatoid arthritis, unspecified: Secondary | ICD-10-CM | POA: Diagnosis not present

## 2018-08-01 DIAGNOSIS — J45998 Other asthma: Secondary | ICD-10-CM | POA: Diagnosis not present

## 2018-08-03 ENCOUNTER — Other Ambulatory Visit: Payer: Self-pay | Admitting: Internal Medicine

## 2018-08-03 DIAGNOSIS — R1084 Generalized abdominal pain: Secondary | ICD-10-CM

## 2018-08-04 ENCOUNTER — Other Ambulatory Visit: Payer: Self-pay | Admitting: Rheumatology

## 2018-08-06 NOTE — Telephone Encounter (Signed)
Last Visit: 03/07/18 Next Visit: 08/07/18 Labs: 01/04/18 Elevated glucose PLQ Eye Exam: 03/15/18 WNL   Okay to refill 30 day supply per Dr. Estanislado Pandy

## 2018-08-07 ENCOUNTER — Encounter: Payer: Self-pay | Admitting: Rheumatology

## 2018-08-07 ENCOUNTER — Ambulatory Visit: Payer: Medicare HMO | Admitting: Rheumatology

## 2018-08-07 VITALS — BP 146/83 | HR 65 | Resp 15 | Ht 64.0 in | Wt 246.0 lb

## 2018-08-07 DIAGNOSIS — M5136 Other intervertebral disc degeneration, lumbar region: Secondary | ICD-10-CM | POA: Diagnosis not present

## 2018-08-07 DIAGNOSIS — Z79899 Other long term (current) drug therapy: Secondary | ICD-10-CM | POA: Diagnosis not present

## 2018-08-07 DIAGNOSIS — M5030 Other cervical disc degeneration, unspecified cervical region: Secondary | ICD-10-CM

## 2018-08-07 DIAGNOSIS — Z8679 Personal history of other diseases of the circulatory system: Secondary | ICD-10-CM | POA: Diagnosis not present

## 2018-08-07 DIAGNOSIS — Z96653 Presence of artificial knee joint, bilateral: Secondary | ICD-10-CM | POA: Diagnosis not present

## 2018-08-07 DIAGNOSIS — L97921 Non-pressure chronic ulcer of unspecified part of left lower leg limited to breakdown of skin: Secondary | ICD-10-CM

## 2018-08-07 DIAGNOSIS — R600 Localized edema: Secondary | ICD-10-CM | POA: Diagnosis not present

## 2018-08-07 DIAGNOSIS — I83893 Varicose veins of bilateral lower extremities with other complications: Secondary | ICD-10-CM

## 2018-08-07 DIAGNOSIS — Z8639 Personal history of other endocrine, nutritional and metabolic disease: Secondary | ICD-10-CM

## 2018-08-07 DIAGNOSIS — M0609 Rheumatoid arthritis without rheumatoid factor, multiple sites: Secondary | ICD-10-CM | POA: Diagnosis not present

## 2018-08-07 DIAGNOSIS — Z853 Personal history of malignant neoplasm of breast: Secondary | ICD-10-CM

## 2018-08-07 DIAGNOSIS — Z8669 Personal history of other diseases of the nervous system and sense organs: Secondary | ICD-10-CM

## 2018-08-07 LAB — CBC WITH DIFFERENTIAL/PLATELET
ABSOLUTE MONOCYTES: 672 {cells}/uL (ref 200–950)
Basophils Absolute: 42 cells/uL (ref 0–200)
Basophils Relative: 0.6 %
EOS PCT: 1.3 %
Eosinophils Absolute: 91 cells/uL (ref 15–500)
HCT: 42.2 % (ref 35.0–45.0)
Hemoglobin: 14.2 g/dL (ref 11.7–15.5)
LYMPHS ABS: 2646 {cells}/uL (ref 850–3900)
MCH: 30.2 pg (ref 27.0–33.0)
MCHC: 33.6 g/dL (ref 32.0–36.0)
MCV: 89.8 fL (ref 80.0–100.0)
MPV: 11.2 fL (ref 7.5–12.5)
Monocytes Relative: 9.6 %
NEUTROS ABS: 3549 {cells}/uL (ref 1500–7800)
Neutrophils Relative %: 50.7 %
PLATELETS: 175 10*3/uL (ref 140–400)
RBC: 4.7 10*6/uL (ref 3.80–5.10)
RDW: 13.2 % (ref 11.0–15.0)
Total Lymphocyte: 37.8 %
WBC: 7 10*3/uL (ref 3.8–10.8)

## 2018-08-07 LAB — COMPLETE METABOLIC PANEL WITH GFR
AG Ratio: 1.9 (calc) (ref 1.0–2.5)
ALT: 32 U/L — AB (ref 6–29)
AST: 32 U/L (ref 10–35)
Albumin: 4.4 g/dL (ref 3.6–5.1)
Alkaline phosphatase (APISO): 50 U/L (ref 33–130)
BILIRUBIN TOTAL: 0.5 mg/dL (ref 0.2–1.2)
BUN: 12 mg/dL (ref 7–25)
CHLORIDE: 100 mmol/L (ref 98–110)
CO2: 28 mmol/L (ref 20–32)
Calcium: 9.4 mg/dL (ref 8.6–10.4)
Creat: 0.61 mg/dL (ref 0.60–0.93)
GFR, Est African American: 104 mL/min/{1.73_m2} (ref >=60)
GFR, Est Non African American: 90 mL/min/{1.73_m2} (ref >=60)
GLUCOSE: 146 mg/dL — AB (ref 65–99)
Globulin: 2.3 g/dL (calc) (ref 1.9–3.7)
Potassium: 3.9 mmol/L (ref 3.5–5.3)
Sodium: 141 mmol/L (ref 135–146)
Total Protein: 6.7 g/dL (ref 6.1–8.1)

## 2018-08-08 NOTE — Progress Notes (Signed)
Mild elevation of LFT. Avoid NSAIDS and alcohol.

## 2018-08-09 ENCOUNTER — Ambulatory Visit
Admission: RE | Admit: 2018-08-09 | Discharge: 2018-08-09 | Disposition: A | Discharge status: Home / Self Care | Payer: Medicare HMO | Source: Ambulatory Visit | Attending: Internal Medicine | Admitting: Internal Medicine

## 2018-08-09 DIAGNOSIS — R1084 Generalized abdominal pain: Secondary | ICD-10-CM

## 2018-08-09 DIAGNOSIS — K760 Fatty (change of) liver, not elsewhere classified: Secondary | ICD-10-CM | POA: Diagnosis not present

## 2018-08-09 DIAGNOSIS — K5730 Diverticulosis of large intestine without perforation or abscess without bleeding: Secondary | ICD-10-CM | POA: Diagnosis not present

## 2018-08-09 MED ORDER — IOPAMIDOL (ISOVUE-300) INJECTION 61%
125.0000 mL | Freq: Once | INTRAVENOUS | Status: AC | PRN
  Administered 2018-08-09: 125 mL via INTRAVENOUS

## 2018-08-11 DIAGNOSIS — J4520 Mild intermittent asthma, uncomplicated: Secondary | ICD-10-CM | POA: Diagnosis not present

## 2018-08-28 ENCOUNTER — Other Ambulatory Visit: Payer: Self-pay | Admitting: Internal Medicine

## 2018-08-28 DIAGNOSIS — Z1231 Encounter for screening mammogram for malignant neoplasm of breast: Secondary | ICD-10-CM

## 2018-09-05 ENCOUNTER — Other Ambulatory Visit: Payer: Self-pay | Admitting: Rheumatology

## 2018-09-05 NOTE — Telephone Encounter (Signed)
Last Visit: 08/07/18 Next visit: 01/09/19 Labs: 08/07/18 Mild elevation of LFT. PLQ Eye Exam: 03/15/18 WNL  Okay to refill per Dr. Estanislado Pandy

## 2018-09-11 DIAGNOSIS — J4520 Mild intermittent asthma, uncomplicated: Secondary | ICD-10-CM | POA: Diagnosis not present

## 2018-09-20 DIAGNOSIS — H04123 Dry eye syndrome of bilateral lacrimal glands: Secondary | ICD-10-CM | POA: Diagnosis not present

## 2018-09-20 DIAGNOSIS — Z79899 Other long term (current) drug therapy: Secondary | ICD-10-CM | POA: Diagnosis not present

## 2018-09-20 DIAGNOSIS — E119 Type 2 diabetes mellitus without complications: Secondary | ICD-10-CM | POA: Diagnosis not present

## 2018-09-24 ENCOUNTER — Ambulatory Visit
Admission: RE | Admit: 2018-09-24 | Discharge: 2018-09-24 | Disposition: A | Discharge status: Home / Self Care | Payer: Medicare HMO | Source: Ambulatory Visit | Attending: Internal Medicine | Admitting: Internal Medicine

## 2018-09-24 DIAGNOSIS — Z1231 Encounter for screening mammogram for malignant neoplasm of breast: Secondary | ICD-10-CM | POA: Diagnosis not present

## 2018-10-03 DIAGNOSIS — E041 Nontoxic single thyroid nodule: Secondary | ICD-10-CM | POA: Diagnosis not present

## 2018-10-03 DIAGNOSIS — R82998 Other abnormal findings in urine: Secondary | ICD-10-CM | POA: Diagnosis not present

## 2018-10-03 DIAGNOSIS — E7849 Other hyperlipidemia: Secondary | ICD-10-CM | POA: Diagnosis not present

## 2018-10-03 DIAGNOSIS — E1151 Type 2 diabetes mellitus with diabetic peripheral angiopathy without gangrene: Secondary | ICD-10-CM | POA: Diagnosis not present

## 2018-10-05 ENCOUNTER — Other Ambulatory Visit: Payer: Self-pay | Admitting: Rheumatology

## 2018-10-05 NOTE — Telephone Encounter (Signed)
Last Visit: 08/07/18 Next visit: 01/09/19 Labs: 08/07/18 Mild elevation of LFT. PLQ Eye Exam: 03/15/18 WNL  Okay to refill per Dr. Estanislado Pandy

## 2018-10-10 ENCOUNTER — Telehealth: Payer: Self-pay | Admitting: Rheumatology

## 2018-10-10 DIAGNOSIS — Z0000 Encounter for general adult medical examination without abnormal findings: Secondary | ICD-10-CM | POA: Diagnosis not present

## 2018-10-10 DIAGNOSIS — F3289 Other specified depressive episodes: Secondary | ICD-10-CM | POA: Diagnosis not present

## 2018-10-10 DIAGNOSIS — I2510 Atherosclerotic heart disease of native coronary artery without angina pectoris: Secondary | ICD-10-CM | POA: Diagnosis not present

## 2018-10-10 DIAGNOSIS — R413 Other amnesia: Secondary | ICD-10-CM | POA: Diagnosis not present

## 2018-10-10 DIAGNOSIS — I87333 Chronic venous hypertension (idiopathic) with ulcer and inflammation of bilateral lower extremity: Secondary | ICD-10-CM | POA: Diagnosis not present

## 2018-10-10 DIAGNOSIS — E1151 Type 2 diabetes mellitus with diabetic peripheral angiopathy without gangrene: Secondary | ICD-10-CM | POA: Diagnosis not present

## 2018-10-10 DIAGNOSIS — R251 Tremor, unspecified: Secondary | ICD-10-CM | POA: Diagnosis not present

## 2018-10-10 DIAGNOSIS — R1084 Generalized abdominal pain: Secondary | ICD-10-CM | POA: Diagnosis not present

## 2018-10-10 DIAGNOSIS — J4520 Mild intermittent asthma, uncomplicated: Secondary | ICD-10-CM | POA: Diagnosis not present

## 2018-10-10 DIAGNOSIS — C50919 Malignant neoplasm of unspecified site of unspecified female breast: Secondary | ICD-10-CM | POA: Diagnosis not present

## 2018-10-10 NOTE — Telephone Encounter (Signed)
Patient stopped by the office today with her labwork results from her doctor's appointment.   Marissa made a copy and put it on Andrea's desk.

## 2018-10-19 ENCOUNTER — Telehealth (INDEPENDENT_AMBULATORY_CARE_PROVIDER_SITE_OTHER): Payer: Self-pay | Admitting: Radiology

## 2018-10-19 NOTE — Telephone Encounter (Signed)
Called and spoke with patient, patient answered NO to all screening questions.  

## 2018-10-22 ENCOUNTER — Ambulatory Visit (INDEPENDENT_AMBULATORY_CARE_PROVIDER_SITE_OTHER): Payer: Medicare HMO | Admitting: Orthopedic Surgery

## 2018-10-22 ENCOUNTER — Encounter (INDEPENDENT_AMBULATORY_CARE_PROVIDER_SITE_OTHER): Payer: Self-pay | Admitting: Orthopedic Surgery

## 2018-10-22 ENCOUNTER — Other Ambulatory Visit: Payer: Self-pay

## 2018-10-22 DIAGNOSIS — M25311 Other instability, right shoulder: Secondary | ICD-10-CM | POA: Diagnosis not present

## 2018-10-22 DIAGNOSIS — M25511 Pain in right shoulder: Secondary | ICD-10-CM | POA: Diagnosis not present

## 2018-10-22 MED ORDER — BUPIVACAINE HCL 0.5 % IJ SOLN
9.0000 mL | INTRAMUSCULAR | Status: AC | PRN
  Administered 2018-10-22: 9 mL via INTRA_ARTICULAR

## 2018-10-22 MED ORDER — METHYLPREDNISOLONE ACETATE 40 MG/ML IJ SUSP
40.0000 mg | INTRAMUSCULAR | Status: AC | PRN
  Administered 2018-10-22: 40 mg via INTRA_ARTICULAR

## 2018-10-22 MED ORDER — LIDOCAINE HCL 1 % IJ SOLN
5.0000 mL | INTRAMUSCULAR | Status: AC | PRN
  Administered 2018-10-22: 5 mL

## 2018-10-22 NOTE — Progress Notes (Signed)
Office Visit Note   Patient: [name]           Date of Birth: Aug 07, 1945           MRN: [id number] Visit Date: 10/22/2018 Requested by: Crist Infante, MD [address] [place], [place] [address] PCP: Crist Infante, MD  Subjective: Chief Complaint  Patient presents with  . Right Shoulder - Pain    HPI: Yolando is a patient with right shoulder rotator cuff arthropathy.  Injection previously last about 4 months.  She is now having some issues with the left shoulder due to over compensation.  She uses a cane on the right-hand side.  Has a history of prior rotator cuff surgery.              ROS: All systems reviewed are negative as they relate to the chief complaint within the history of present illness.  Patient denies  fevers or chills.   Assessment & Plan: Visit Diagnoses:  1. Rotator cuff insufficiency of right shoulder     Plan: Impression is right shoulder rotator cuff arthropathy.  Plan is injection performed today.  We will see how she does with that.  She is likely heading for surgery sometime in the future.  I will see her back as needed  Follow-Up Instructions: Return if symptoms worsen or fail to improve.   Orders:  No orders of the defined types were placed in this encounter.  No orders of the defined types were placed in this encounter.     Procedures: Large Joint Inj: R subacromial bursa on 10/22/2018 11:13 AM Indications: diagnostic evaluation and pain Details: 18 G 1.5 in needle, posterior approach  Arthrogram: No  Medications: 9 mL bupivacaine 0.5 %; 40 mg methylPREDNISolone acetate 40 MG/ML; 5 mL lidocaine 1 % Outcome: tolerated well, no immediate complications Procedure, treatment alternatives, risks and benefits explained, specific risks discussed. Consent was given by the patient. Immediately prior to procedure a time out was called to verify the correct patient, procedure, equipment, support staff and site/side marked as required. Patient was prepped  and draped in the usual sterile fashion.       Clinical Data: No additional findings.  Objective: Vital Signs: There were no vitals taken for this visit.  Physical Exam:   Constitutional: Patient appears well-developed HEENT:  Head: Normocephalic Eyes:EOM are normal Neck: Normal range of motion Cardiovascular: Normal rate Pulmonary/chest: Effort normal Neurologic: Patient is alert Skin: Skin is warm Psychiatric: Patient has normal mood and affect    Ortho Exam: Ortho exam demonstrates good cervical spine range of motion.  Right shoulder has limited forward flexion abduction both below 50 degrees.  There is some coarseness and grinding in that right shoulder.  Passively the shoulder range of motion has improved mildly since last clinic visit  Specialty Comments:  No specialty comments available.  Imaging: No results found.   PMFS History: Patient Active Problem List   Diagnosis Date Noted  . Diarrhea   . Benign neoplasm of cecum   . Abdominal pain, epigastric   . Gastroesophageal reflux disease   . Dependence on nocturnal oxygen therapy 07/10/2017  . Open wound of left lower leg 12/26/2016  . Vitamin D deficiency 09/14/2016  . History of total knee replacement, bilateral 09/14/2016  . DJD (degenerative joint disease), cervical 09/14/2016  . 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 High risk medication use 05/20/2016  . Spinal stenosis, lumbar region, with neurogenic claudication 09/08/2015  Class: Chronic  . OSA (obstructive sleep apnea) 10/17/2013  . Dyspnea 09/04/2013  . Atypical chest pain 09/04/2013  . Morbid obesity ([place]) 03/21/2012  . Cancer of lower-outer quadrant of female breast ([place]) 03/15/2012  . Diabetes mellitus type II 02/22/2011  . Coronary artery disease   . Hypertension   . Hyperlipidemia   . OA (osteoarthritis) of knee    Past Medical History:  Diagnosis Date  . Asthma    Albuterol inhaler as  needed.Pulmicort neb as needed  . Asthma   . Breast cancer ([place])    right - lumpectomy   . Chronic back pain    spinal stenosis  . Coronary artery disease   . Dependence on nocturnal oxygen therapy 07/10/2017   Pt uses 2 liters 02 at night   . Depression    takes CYmbalta and Wellbutrin daily  . Diabetes mellitus    not on any meds/controlled by diet  . Dyspnea    with exertion occasionally when lies down  . GERD (gastroesophageal reflux disease)    takes Omeprazole daily  . Headache    oocasionally  . History of kidney stones   . Hyperlipidemia    not on any meds  . Hypertension    takes Benazepril,Bystolic,and Amlodipine daily  . IBS (irritable bowel syndrome)   . Joint pain   . Joint swelling   . Nocturia   . OA (osteoarthritis) of knee   . OSA (obstructive sleep apnea)   . Oxygen deficiency    2 liters at night per pt for OSA- no cpap   . Peripheral edema    takes daily as needed  . Peripheral neuropathy   . Personal history of chemotherapy   . Personal history of radiation therapy   . Pneumonia    hx of-several yrs ago  . RA (rheumatoid arthritis) ([place])   . Sleep apnea    pt states she uses oxygen at night - no cpap- uses 02 2 liters at night   . Urinary frequency   . Urinary urgency   . Weakness    numbness and tingling mainly in left leg occasionally in right.Tingling/numbness in hands    Family History  Problem Relation Age of Onset  . Heart disease Mother   . Cancer Father   . Cancer Brother   . Heart disease Brother     Past Surgical History:  Procedure Laterality Date  . BREAST LUMPECTOMY  1997   RIGHT BREAST  . CARDIAC CATHETERIZATION  04/23/2004   EF 60%  . cataract surgery Bilateral   . COLONOSCOPY    . COLONOSCOPY WITH PROPOFOL N/A 08/07/2017   Procedure: COLONOSCOPY WITH PROPOFOL;  Surgeon: Ladene Artist, MD;  Location: [facility];  Service: Endoscopy;  Laterality: N/A;  . CORONARY ARTERY BYPASS GRAFT  2005   LIMA GRAFT TO LAD,  SAPHENOUS VEIN GRAFT TO THE FIRST DIAGONAL, AND LEFT RADIAL ARTERY GRAFT TO THE OM  . ESOPHAGOGASTRODUODENOSCOPY (EGD) WITH PROPOFOL N/A 08/07/2017   Procedure: ESOPHAGOGASTRODUODENOSCOPY (EGD) WITH PROPOFOL;  Surgeon: Ladene Artist, MD;  [facility];  Service: Endoscopy;  Laterality: N/A;  . FOOT SURGERY Right   . JOINT REPLACEMENT    . LITHOTRIPSY    . LUMBAR LAMINECTOMY/DECOMPRESSION MICRODISCECTOMY N/A 09/08/2015   Procedure: CENTRAL DECOMPRESSIVE LUMBAR LAMINECTOMIES L3-4, L4-5 AND BILATERAL HEMILAMINECTOMY L5-S1;  Surgeon: Jessy Oto, MD;  Location: [facility];  Service: Orthopedics;  Laterality: N/A;  . nodule removed from left elbow    . SHOULDER  ARTHROSCOPY    . TOTAL KNEE ARTHROPLASTY Bilateral   . TOTAL SHOULDER ARTHROPLASTY    . TRIPLE BYPASS  04/27/05  . US ECHOCARDIOGRAPHY  01/06/2009   EF 55-60%  . WRIST SURGERY Left    Social History   Occupational History  . Occupation: retired    Fish farm manager: UNEMPLOYED  Tobacco Use  . Smoking status: Never Smoker  . Smokeless tobacco: Never Used  Substance and Sexual Activity  . Alcohol use: No    Alcohol/week: 0.0 standard drinks  . Drug use: Never  . Sexual activity: Not on file

## 2018-10-31 DIAGNOSIS — C50911 Malignant neoplasm of unspecified site of right female breast: Secondary | ICD-10-CM | POA: Diagnosis not present

## 2018-11-10 DIAGNOSIS — J4520 Mild intermittent asthma, uncomplicated: Secondary | ICD-10-CM | POA: Diagnosis not present

## 2018-12-10 DIAGNOSIS — J4520 Mild intermittent asthma, uncomplicated: Secondary | ICD-10-CM | POA: Diagnosis not present

## 2018-12-27 NOTE — Progress Notes (Signed)
Office Visit Note  Patient: [name]             Date of Birth: [birth year]           MRN: [id number]             PCP: Crist Infante, MD Referring: Crist Infante, MD Visit Date: 01/09/2019 Occupation: @GUAROCC @  Subjective: Pain in multiple joints   History of Present Illness: [name] is a 73 y.o. female with history of rheumatoid arthritis and osteoarthritis.  She states she has been experiencing increased pain and discomfort.  She describes pain and discomfort in her bilateral shoulders, bilateral hands and bilateral feet.  She also experiences some generalized pain.  She has been taking Plaquenil on regular basis.  She has lower and lumbar spine disc disease which causes discomfort.  She states she continues to have the ulcer on her leg which is healing.  She was recently seen by dermatologist who gave her some topical agent which is been helpful.  Activities of Daily Living:  Patient reports morning stiffness for 2 hours.   Patient Denies nocturnal pain.  Difficulty dressing/grooming: Denies Difficulty climbing stairs: Reports Difficulty getting out of chair: Reports Difficulty using hands for taps, buttons, cutlery, and/or writing: Reports  Review of Systems  Constitutional: Positive for fatigue. Negative for night sweats, weight gain and weight loss.  HENT: Positive for mouth dryness. Negative for mouth sores, trouble swallowing, trouble swallowing and nose dryness.   Eyes: Negative for pain, redness, visual disturbance and dryness.  Respiratory: Negative for cough, shortness of breath and difficulty breathing.   Cardiovascular: Negative for chest pain, palpitations, hypertension, irregular heartbeat and swelling in legs/feet.  Gastrointestinal: Positive for diarrhea. Negative for blood in stool and constipation.  Endocrine: Negative for heat intolerance, excessive thirst and increased urination.  Genitourinary: Negative for painful urination and vaginal dryness.   Musculoskeletal: Positive for arthralgias, gait problem, joint pain, joint swelling, muscle weakness, morning stiffness and muscle tenderness. Negative for myalgias and myalgias.  Skin: Negative for color change, rash, hair loss, redness, skin tightness, ulcers and sensitivity to sunlight.  Allergic/Immunologic: Negative for susceptible to infections.  Neurological: Negative for dizziness, numbness, memory loss, night sweats and weakness.  Hematological: Negative for bruising/bleeding tendency and swollen glands.  Psychiatric/Behavioral: Negative for depressed mood and sleep disturbance. The patient is not nervous/anxious.     PMFS History:  Patient Active Problem List   Diagnosis Date Noted  . Diarrhea   . Benign neoplasm of cecum   . Abdominal pain, epigastric   . Gastroesophageal reflux disease   . Dependence on nocturnal oxygen therapy 07/10/2017  . Open wound of left lower leg 12/26/2016  . Vitamin D deficiency 09/14/2016  . History of total knee replacement, bilateral 09/14/2016  . DJD (degenerative joint disease), cervical 09/14/2016  . 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 High risk medication use 05/20/2016  . Spinal stenosis, lumbar region, with neurogenic claudication 09/08/2015    Class: Chronic  . OSA (obstructive sleep apnea) 10/17/2013  . Dyspnea 09/04/2013  . Atypical chest pain 09/04/2013  . Morbid obesity ([place]) 03/21/2012  . Cancer of lower-outer quadrant of female breast ([place]) 03/15/2012  . Diabetes mellitus type II 02/22/2011  . Coronary artery disease   . Hypertension   . Hyperlipidemia   . OA (osteoarthritis) of knee     Past Medical History:  Diagnosis Date  . Actinic keratosis   . Asthma    Albuterol  inhaler as needed.Pulmicort neb as needed  . Asthma   . Breast cancer ([place])    right - lumpectomy   . Chronic back pain    spinal stenosis  . Coronary artery disease   . Dependence on nocturnal oxygen therapy  07/10/2017   Pt uses 2 liters 02 at night   . Depression    takes CYmbalta and Wellbutrin daily  . Diabetes mellitus    not on any meds/controlled by diet  . Dyspnea    with exertion occasionally when lies down  . GERD (gastroesophageal reflux disease)    takes Omeprazole daily  . Headache    oocasionally  . History of kidney stones   . Hyperlipidemia    not on any meds  . Hypertension    takes Benazepril,Bystolic,and Amlodipine daily  . IBS (irritable bowel syndrome)   . Joint pain   . Joint swelling   . Livedoid vasculitis   . Nocturia   . OA (osteoarthritis) of knee   . OSA (obstructive sleep apnea)   . Other seborrheic keratosis   . Oxygen deficiency    2 liters at night per pt for OSA- no cpap   . Peripheral edema    takes daily as needed  . Peripheral neuropathy   . Personal history of chemotherapy   . Personal history of radiation therapy   . Pneumonia    hx of-several yrs ago  . RA (rheumatoid arthritis) ([place])   . Sleep apnea    pt states she uses oxygen at night - no cpap- uses 02 2 liters at night   . Urinary frequency   . Urinary urgency   . Weakness    numbness and tingling mainly in left leg occasionally in right.Tingling/numbness in hands    Family History  Problem Relation Age of Onset  . Heart disease Mother   . Cancer Father   . Cancer Brother   . Heart disease Brother    Past Surgical History:  Procedure Laterality Date  . BREAST LUMPECTOMY  1997   RIGHT BREAST  . CARDIAC CATHETERIZATION  04/23/2004   EF 60%  . cataract surgery Bilateral   . COLONOSCOPY    . COLONOSCOPY WITH PROPOFOL N/A 08/07/2017   Procedure: COLONOSCOPY WITH PROPOFOL;  Surgeon: Ladene Artist, MD;  [facility];  Service: Endoscopy;  Laterality: N/A;  . CORONARY ARTERY BYPASS GRAFT  2005   LIMA GRAFT TO LAD, SAPHENOUS VEIN GRAFT TO THE FIRST DIAGONAL, AND LEFT RADIAL ARTERY GRAFT TO THE OM  . ESOPHAGOGASTRODUODENOSCOPY (EGD) WITH PROPOFOL N/A 08/07/2017    Procedure: ESOPHAGOGASTRODUODENOSCOPY (EGD) WITH PROPOFOL;  Surgeon: Ladene Artist, MD;  [facility];  Service: Endoscopy;  Laterality: N/A;  . FOOT SURGERY Right   . JOINT REPLACEMENT    . LITHOTRIPSY    . LUMBAR LAMINECTOMY/DECOMPRESSION MICRODISCECTOMY N/A 09/08/2015   Procedure: CENTRAL DECOMPRESSIVE LUMBAR LAMINECTOMIES L3-4, L4-5 AND BILATERAL HEMILAMINECTOMY L5-S1;  Surgeon: Jessy Oto, MD;  Location: [facility];  Service: Orthopedics;  Laterality: N/A;  . nodule removed from left elbow    . SHOULDER ARTHROSCOPY    . TOTAL KNEE ARTHROPLASTY Bilateral   . TOTAL SHOULDER ARTHROPLASTY    . TRIPLE BYPASS  04/27/05  . US ECHOCARDIOGRAPHY  01/06/2009   EF 55-60%  . WRIST SURGERY Left    Social History   Social History Narrative  . Not on file   Immunization History  Administered Date(s) Administered  . Influenza Split 04/24/2013  . Zoster  Recombinat (Shingrix) 10/11/2017, 12/22/2017     Objective: Vital Signs: BP (!) 162/82 (BP Location: Left Arm, Patient Position: Sitting, Cuff Size: Normal)   Pulse 64   Resp 18   Ht 5\' 4"  (1.626 m)   Wt 245 lb 3.2 oz (111.2 kg)   BMI 42.09 kg/m    Physical Exam Vitals signs and nursing note reviewed.  Constitutional:      Appearance: She is well-developed.  HENT:     Head: Normocephalic and atraumatic.  Eyes:     Conjunctiva/sclera: Conjunctivae normal.  Neck:     Musculoskeletal: Normal range of motion.  Cardiovascular:     Rate and Rhythm: Normal rate and regular rhythm.     Heart sounds: Normal heart sounds.  Pulmonary:     Effort: Pulmonary effort is normal.     Breath sounds: Normal breath sounds.  Abdominal:     General: Bowel sounds are normal.     Palpations: Abdomen is soft.  Lymphadenopathy:     Cervical: No cervical adenopathy.  Skin:    General: Skin is warm and dry.     Capillary Refill: Capillary refill takes less than 2 seconds.  Neurological:     Mental Status: She is alert and oriented to  person, place, and time.  Psychiatric:        Behavior: Behavior normal.      Musculoskeletal Exam: Patient has limited range of motion affecting her lumbar spine.  Right shoulder abduction is limited to only 30 degrees.  Elbow joints wrist joints with good range of motion.  She has MCP thickening but no synovitis.  PIP and DIP thickening was noted.  She is good range of motion of her hip joints and knee joints.  She has edema on her left foot.  She also had some discomfort in her toes especially her left first toe which are believe is due to osteoarthritis.  CDAI Exam: CDAI Score: 0.8  Patient Global: 5 mm; Provider Global: 3 mm Swollen: 0 ; Tender: 0  Joint Exam   No joint exam has been documented for this visit   There is currently no information documented on the homunculus. Go to the Rheumatology activity and complete the homunculus joint exam.  Investigation: No additional findings.  Imaging: No results found.  Recent Labs: Lab Results  Component Value Date   WBC 7.0 08/07/2018   HGB 14.2 08/07/2018   PLT 175 08/07/2018   NA 141 08/07/2018   K 3.9 08/07/2018   CL 100 08/07/2018   CO2 28 08/07/2018   GLUCOSE 146 (H) 08/07/2018   BUN 12 08/07/2018   CREATININE 0.61 08/07/2018   BILITOT 0.5 08/07/2018   ALKPHOS 63 01/04/2018   AST 32 08/07/2018   ALT 32 (H) 08/07/2018   PROT 6.7 08/07/2018   ALBUMIN 4.3 01/04/2018   CALCIUM 9.4 08/07/2018   GFRAA 104 08/07/2018    Speciality Comments: PLQ Eye Exam: 09/20/18 WNL @ [facility] follow up in 6 months.   Procedures:  No procedures performed Allergies: Statins   Assessment / Plan:     Visit Diagnoses: Rheumatoid arthritis of multiple sites with negative rheumatoid factor ([place]) -she was treated with methotrexate and Plaquenil in the past.  Methotrexate was discontinued last year due to lower extremity ulcer.  I do not see any synovitis on examination although she has multiple arthralgias.  I believe most of  the discomfort is coming from osteoarthritis.  Plan: We will continue Plaquenil monotherapy.  High risk  medication use - PLQ200mg  po bid, MTX was discontinued in July 2019 due to lower extremity ulcers.eye exam: 03/15/2018  - Plan: CBC with Differential/Platelet, COMPLETE METABOLIC PANEL WITH GFR, today  Chronic right shoulder pain-patient had surgery in the past.  She has very limited range of motion.  She continues to have the chronic discomfort.  No effusion was noted.  Ulcer of left lower extremity, limited to breakdown of skin [facility]) -patient has been followed by her PCP and dermatologist.  Pedal edema -persist which causes some discomfort in her lower extremities.  Varicose veins of bilateral lower extremities with other complications-need for regular walking was discussed.  History of total knee replacement, bilateral - Plan: Doing well  DDD (degenerative disc disease), cervical -she has chronic discomfort but no increased discomfort or radiculopathy.  DDD (degenerative disc disease), lumbar - Plan: Patient has chronic pain.  History of hypertension -blood pressure is elevated today.  Have advised her to monitor blood pressure closely and follow-up with Dr. Haynes Kerns.  History of vitamin D deficiency -she is on supplement.  Other medical problems are listed as follows:  History of diabetes mellitus   History of breast cancer   History of coronary artery disease   History of sleep apnea   Orders: Orders Placed This Encounter  Procedures  . CBC with Differential/Platelet  . COMPLETE METABOLIC PANEL WITH GFR   No orders of the defined types were placed in this encounter.   Face-to-face time spent with patient was 30 minutes. Greater than 50% of time was spent in counseling and coordination of care.  Follow-Up Instructions: Return in about 5 months (around 06/11/2019) for Rheumatoid arthritis.   Bo Merino, MD  Note - This record has been created using Radio producer.  Chart creation errors have been sought, but may not always  have been located. Such creation errors do not reflect on  the standard of medical care.

## 2019-01-01 ENCOUNTER — Other Ambulatory Visit: Payer: Self-pay | Admitting: Rheumatology

## 2019-01-01 DIAGNOSIS — L950 Livedoid vasculitis: Secondary | ICD-10-CM | POA: Diagnosis not present

## 2019-01-01 DIAGNOSIS — L821 Other seborrheic keratosis: Secondary | ICD-10-CM | POA: Diagnosis not present

## 2019-01-01 DIAGNOSIS — L570 Actinic keratosis: Secondary | ICD-10-CM | POA: Diagnosis not present

## 2019-01-01 NOTE — Telephone Encounter (Signed)
Last Visit: 08/07/2018 Next Visit: 01/09/2019 Labs: 10/03/2018 elevated glucose.  Eye exam: 09/20/2018  Okay to refill per Dr. Estanislado Pandy.

## 2019-01-09 ENCOUNTER — Other Ambulatory Visit: Payer: Self-pay

## 2019-01-09 ENCOUNTER — Encounter: Payer: Self-pay | Admitting: Rheumatology

## 2019-01-09 ENCOUNTER — Ambulatory Visit: Payer: Medicare HMO | Admitting: Rheumatology

## 2019-01-09 VITALS — BP 162/82 | HR 64 | Resp 18 | Ht 64.0 in | Wt 245.2 lb

## 2019-01-09 DIAGNOSIS — Z96653 Presence of artificial knee joint, bilateral: Secondary | ICD-10-CM

## 2019-01-09 DIAGNOSIS — M0609 Rheumatoid arthritis without rheumatoid factor, multiple sites: Secondary | ICD-10-CM | POA: Diagnosis not present

## 2019-01-09 DIAGNOSIS — Z8679 Personal history of other diseases of the circulatory system: Secondary | ICD-10-CM | POA: Diagnosis not present

## 2019-01-09 DIAGNOSIS — R600 Localized edema: Secondary | ICD-10-CM | POA: Diagnosis not present

## 2019-01-09 DIAGNOSIS — I83893 Varicose veins of bilateral lower extremities with other complications: Secondary | ICD-10-CM | POA: Diagnosis not present

## 2019-01-09 DIAGNOSIS — Z8669 Personal history of other diseases of the nervous system and sense organs: Secondary | ICD-10-CM

## 2019-01-09 DIAGNOSIS — Z79899 Other long term (current) drug therapy: Secondary | ICD-10-CM | POA: Diagnosis not present

## 2019-01-09 DIAGNOSIS — Z853 Personal history of malignant neoplasm of breast: Secondary | ICD-10-CM

## 2019-01-09 DIAGNOSIS — M5030 Other cervical disc degeneration, unspecified cervical region: Secondary | ICD-10-CM | POA: Diagnosis not present

## 2019-01-09 DIAGNOSIS — G8929 Other chronic pain: Secondary | ICD-10-CM

## 2019-01-09 DIAGNOSIS — Z8639 Personal history of other endocrine, nutritional and metabolic disease: Secondary | ICD-10-CM

## 2019-01-09 DIAGNOSIS — L97921 Non-pressure chronic ulcer of unspecified part of left lower leg limited to breakdown of skin: Secondary | ICD-10-CM | POA: Diagnosis not present

## 2019-01-09 DIAGNOSIS — M25511 Pain in right shoulder: Secondary | ICD-10-CM

## 2019-01-09 DIAGNOSIS — M5136 Other intervertebral disc degeneration, lumbar region: Secondary | ICD-10-CM | POA: Diagnosis not present

## 2019-01-10 DIAGNOSIS — J4520 Mild intermittent asthma, uncomplicated: Secondary | ICD-10-CM | POA: Diagnosis not present

## 2019-01-10 LAB — CBC WITH DIFFERENTIAL/PLATELET
Absolute Monocytes: 628 cells/uL (ref 200–950)
Basophils Absolute: 28 cells/uL (ref 0–200)
Basophils Relative: 0.4 %
Eosinophils Absolute: 69 cells/uL (ref 15–500)
Eosinophils Relative: 1 %
HCT: 41.8 % (ref 35.0–45.0)
Hemoglobin: 13.9 g/dL (ref 11.7–15.5)
Lymphs Abs: 2677 cells/uL (ref 850–3900)
MCH: 30 pg (ref 27.0–33.0)
MCHC: 33.3 g/dL (ref 32.0–36.0)
MCV: 90.3 fL (ref 80.0–100.0)
MPV: 10.8 fL (ref 7.5–12.5)
Monocytes Relative: 9.1 %
Neutro Abs: 3498 cells/uL (ref 1500–7800)
Neutrophils Relative %: 50.7 %
Platelets: 183 10*3/uL (ref 140–400)
RBC: 4.63 10*6/uL (ref 3.80–5.10)
RDW: 13.2 % (ref 11.0–15.0)
Total Lymphocyte: 38.8 %
WBC: 6.9 10*3/uL (ref 3.8–10.8)

## 2019-01-10 LAB — COMPLETE METABOLIC PANEL WITH GFR
AG Ratio: 1.9 (calc) (ref 1.0–2.5)
ALT: 31 U/L — ABNORMAL HIGH (ref 6–29)
AST: 28 U/L (ref 10–35)
Albumin: 4.4 g/dL (ref 3.6–5.1)
Alkaline phosphatase (APISO): 50 U/L (ref 37–153)
BUN/Creatinine Ratio: 25 (calc) — ABNORMAL HIGH (ref 6–22)
BUN: 14 mg/dL (ref 7–25)
CO2: 32 mmol/L (ref 20–32)
Calcium: 9.2 mg/dL (ref 8.6–10.4)
Chloride: 101 mmol/L (ref 98–110)
Creat: 0.55 mg/dL — ABNORMAL LOW (ref 0.60–0.93)
GFR, Est African American: 108 mL/min/{1.73_m2} (ref >=60)
GFR, Est Non African American: 93 mL/min/{1.73_m2} (ref >=60)
Globulin: 2.3 g/dL (calc) (ref 1.9–3.7)
Glucose, Bld: 119 mg/dL — ABNORMAL HIGH (ref 65–99)
Potassium: 4.2 mmol/L (ref 3.5–5.3)
Sodium: 141 mmol/L (ref 135–146)
Total Bilirubin: 0.5 mg/dL (ref 0.2–1.2)
Total Protein: 6.7 g/dL (ref 6.1–8.1)

## 2019-01-31 DIAGNOSIS — D485 Neoplasm of uncertain behavior of skin: Secondary | ICD-10-CM | POA: Diagnosis not present

## 2019-01-31 DIAGNOSIS — L570 Actinic keratosis: Secondary | ICD-10-CM | POA: Diagnosis not present

## 2019-01-31 DIAGNOSIS — L950 Livedoid vasculitis: Secondary | ICD-10-CM | POA: Diagnosis not present

## 2019-02-06 IMAGING — MR MR SHOULDER*R* W/O CM
4 of 5 series · 18 of 40 positions shown · non-contrast
Comparison: Report from 02/22/2013

CLINICAL DATA: Right shoulder pain for greater than 1 year.

EXAM:
MRI OF THE RIGHT SHOULDER WITHOUT CONTRAST
TECHNIQUE: Multiplanar, multisequence MR imaging of the shoulder was performed.
No intravenous contrast was administered.

[Series 7: T2 fat-sat · axial · 4.0mm · 0.28mm/px · z∈[+2,+55]mm · 4 of 16 slices shown]
[im 1/16]
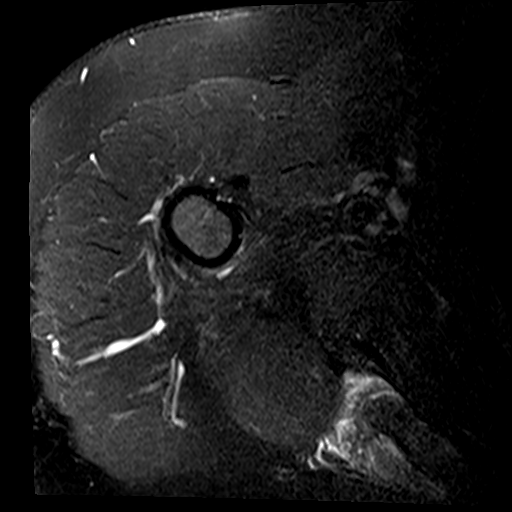
[im 3/16]
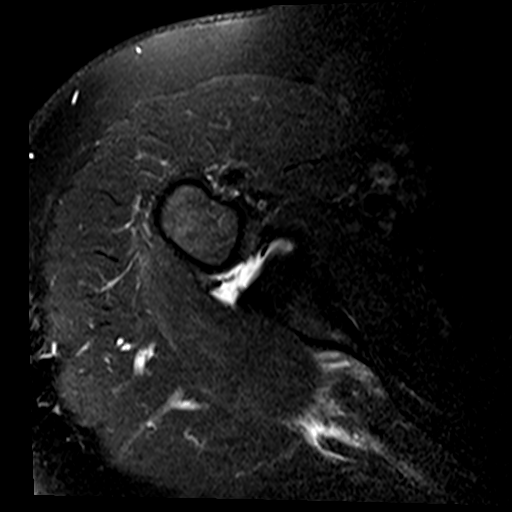
[im 8/16]
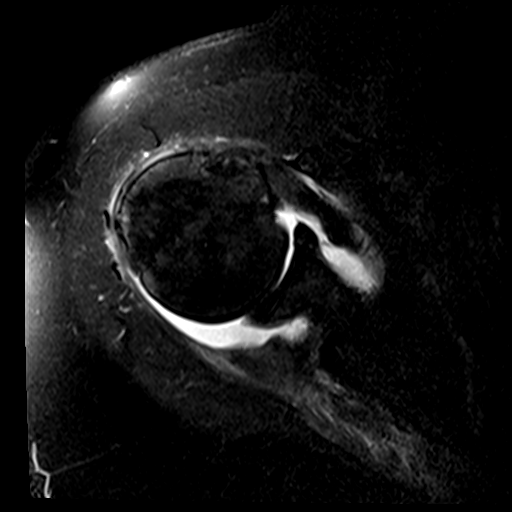
[im 13/16]
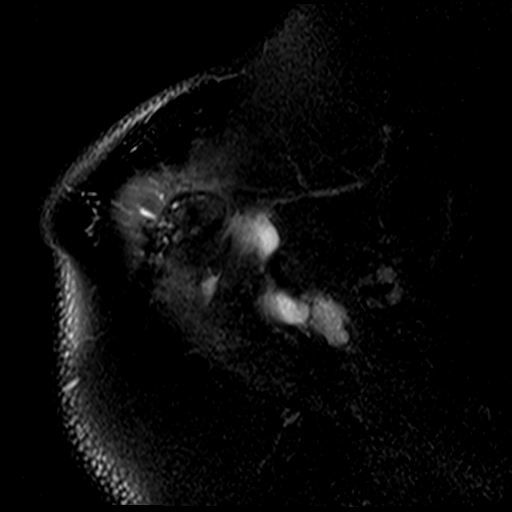

[Series 8: T2 · oblique · 4.0mm · 0.31mm/px · 3 of 16 slices shown]
[im 3/16]
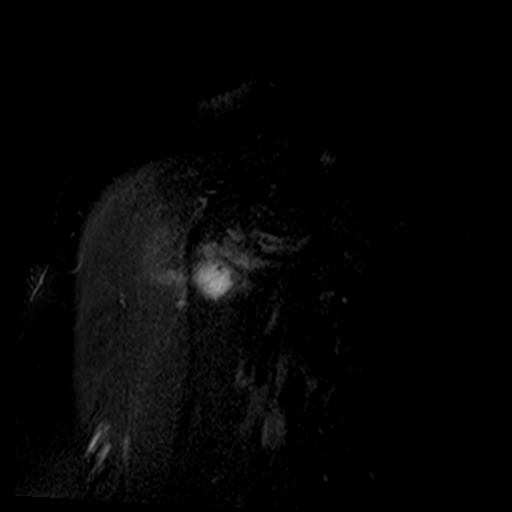
[im 8/16]
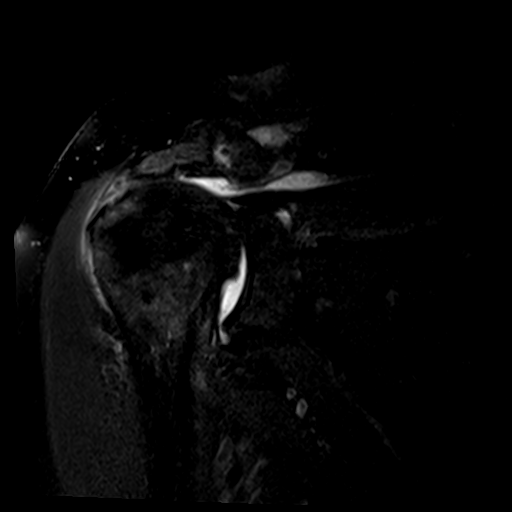
[im 13/16]
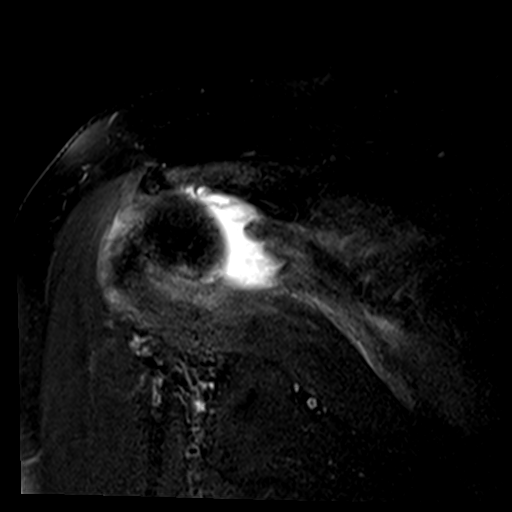

[Series 9: PD · oblique · 4.0mm · 0.31mm/px · 8 of 16 slices shown]
[im 1/16]
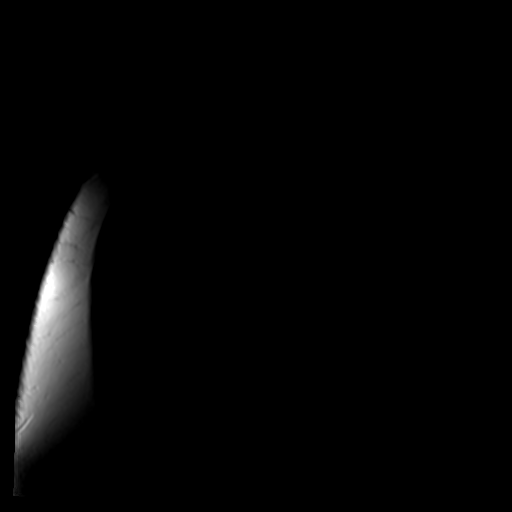
[im 3/16]
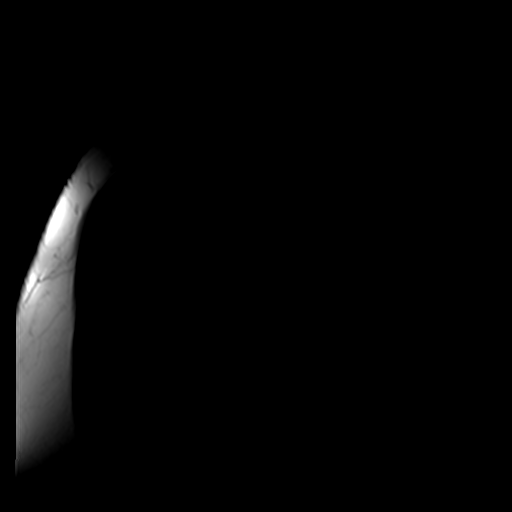
[im 5/16]
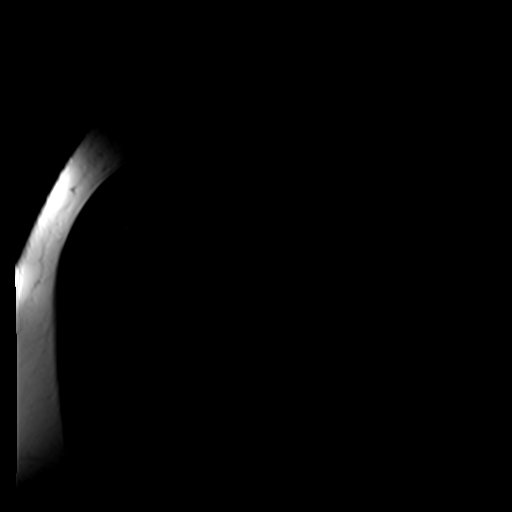
[im 7/16]
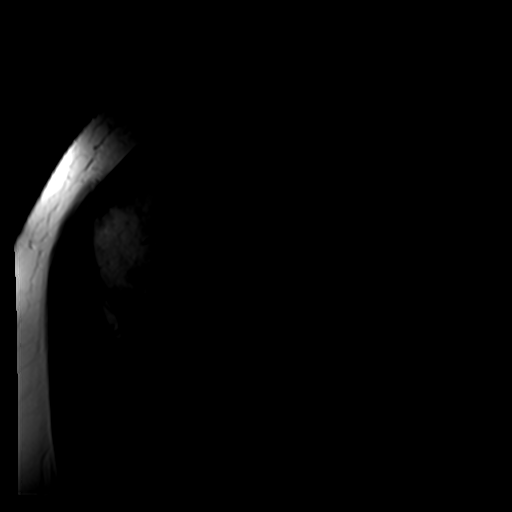
[im 9/16]
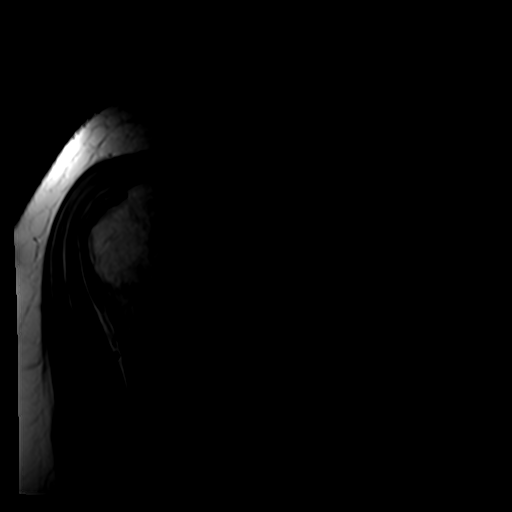
[im 11/16]
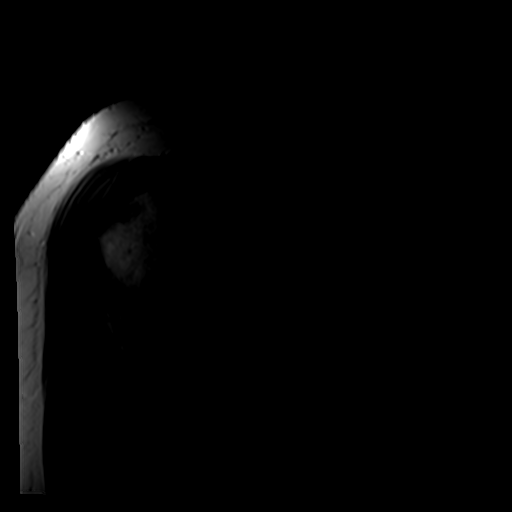
[im 13/16]
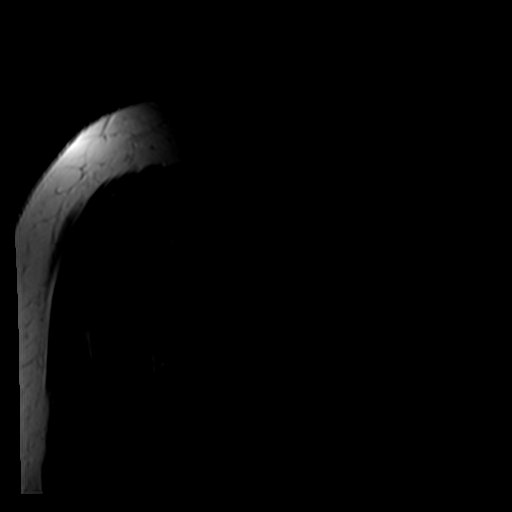
[im 16/16]
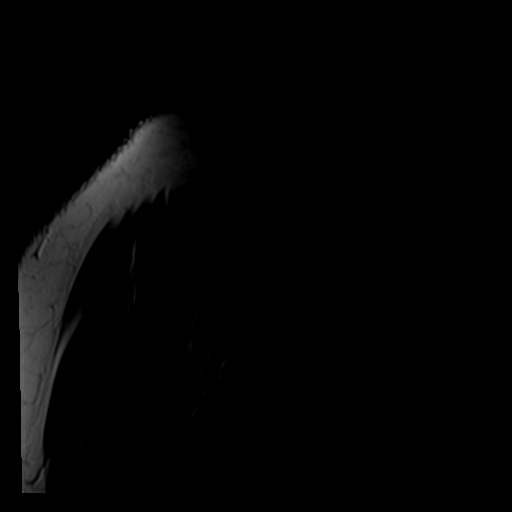

[Series 10: T1 · oblique · 4.0mm · 0.31mm/px · 3 of 18 slices shown]
[im 3/18]
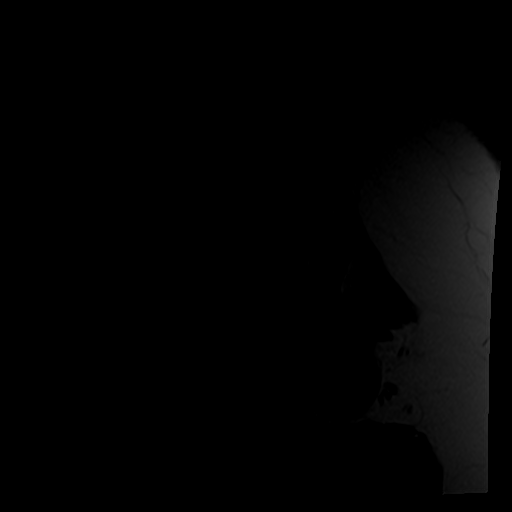
[im 9/18]
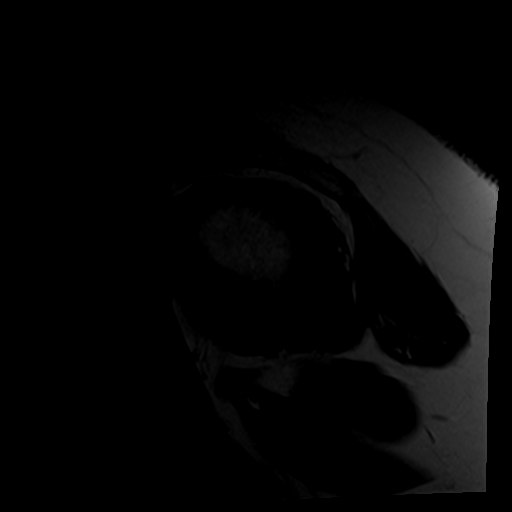
[im 15/18]
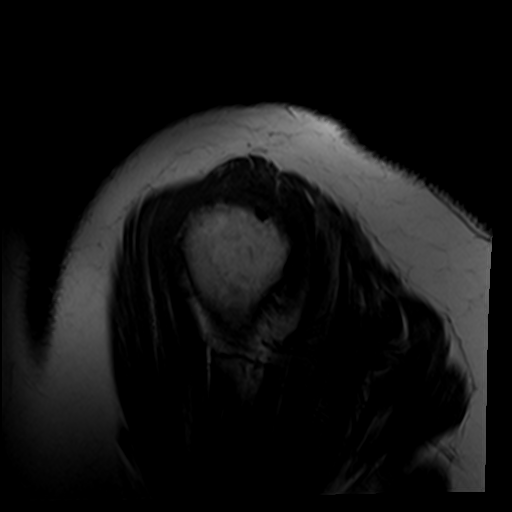

[18 of 40 positions shown; findings below may reference images not displayed]

FINDINGS: Rotator cuff: Full-thickness full width supraspinatus rupture
retracted 4.1 cm back to the plane of the glenoid.

Full-thickness full width infraspinatus tear retracted 4.1 cm back
to the plane of the glenoid.

Mild articular surface partial thickness tearing of the
subscapularis tendon. Mild teres minor tendinopathy.

Muscles: Considerable abnormal edema tracks in the infraspinatus
muscle. Mild infraspinatus and supraspinatus atrophy. Subtle edema
tracks along the inferior margin of the remaining supraspinatus
muscle.

Biceps long head: Ill definition of the intra-articular segment,
with suspicion for longitudinal tearing.

Acromioclavicular Joint: Mild spurring. Subacromial morphology is
type 2 (curved). The humeral head is subluxed superiorly against the
acromial undersurface with chronic accommodation of the acromial
undersurface indicating that the supraspinatus rupture is likely
chronic. The supraspinatus tendon is striated tendinopathy and
potentially mild partial tearing on 02/22/2013. There is free
communication of the glenohumeral joint effusion with the
subacromial subdeltoid bursa and subcoracoid bursa.

Glenohumeral Joint: The humeral head is subluxed superiorly against
the acromion, with associated pseudoarticulation. Mild spurring of
the humeral head and glenoid. There is only mild degenerative
chondral thinning. Glenohumeral joint effusion communicating with
the subacromial subdeltoid bursa.

Labrum:  Grossly unremarkable

Bones: No significant extra-articular osseous abnormalities
identified.

Other: No supplemental non-categorized findings.
IMPRESSION: 1. Full-thickness full width chronic rupture of the supraspinatus
tendon retracted back 4.1 cm to the plane of the glenoid. Mild
supraspinatus muscular atrophy.
2. Full-thickness full width rupture of the infraspinatus tendon
back to the plane of the glenoid, with considerable abnormal edema
signal throughout the infraspinatus muscle. There is also mild
infraspinatus muscular atrophy.
3. The humeral head is subluxed superiorly against the acromial
undersurface which demonstrates accommodation indicating chronic
rotator cuff tear.
4. Mild degenerative glenohumeral arthropathy with spurring and mild
chondral thinning. Mild spurring of the AC joint.
5. Glenohumeral joint effusion communicates with the subacromial
subdeltoid bursa and the subcoracoid bursa.
6. There is at least longitudinal split tearing of the
intra-articular segment of long head of biceps.

## 2019-02-09 DIAGNOSIS — J4520 Mild intermittent asthma, uncomplicated: Secondary | ICD-10-CM | POA: Diagnosis not present

## 2019-02-18 DIAGNOSIS — J019 Acute sinusitis, unspecified: Secondary | ICD-10-CM | POA: Diagnosis not present

## 2019-02-18 DIAGNOSIS — R05 Cough: Secondary | ICD-10-CM | POA: Diagnosis not present

## 2019-02-18 DIAGNOSIS — Z20818 Contact with and (suspected) exposure to other bacterial communicable diseases: Secondary | ICD-10-CM | POA: Diagnosis not present

## 2019-02-18 DIAGNOSIS — B370 Candidal stomatitis: Secondary | ICD-10-CM | POA: Diagnosis not present

## 2019-02-18 DIAGNOSIS — J441 Chronic obstructive pulmonary disease with (acute) exacerbation: Secondary | ICD-10-CM | POA: Diagnosis not present

## 2019-02-24 ENCOUNTER — Inpatient Hospital Stay (HOSPITAL_COMMUNITY)
Admission: EM | Admit: 2019-02-24 | Discharge: 2019-03-07 | DRG: 177 | Disposition: A | Discharge status: Home Health | Payer: Medicare HMO | Attending: Internal Medicine | Admitting: Internal Medicine

## 2019-02-24 ENCOUNTER — Encounter (HOSPITAL_COMMUNITY): Payer: Self-pay

## 2019-02-24 ENCOUNTER — Other Ambulatory Visit: Payer: Self-pay

## 2019-02-24 ENCOUNTER — Emergency Department (HOSPITAL_COMMUNITY): Payer: Medicare HMO

## 2019-02-24 DIAGNOSIS — E7800 Pure hypercholesterolemia, unspecified: Secondary | ICD-10-CM | POA: Diagnosis not present

## 2019-02-24 DIAGNOSIS — J1289 Other viral pneumonia: Secondary | ICD-10-CM | POA: Diagnosis not present

## 2019-02-24 DIAGNOSIS — Z96619 Presence of unspecified artificial shoulder joint: Secondary | ICD-10-CM | POA: Diagnosis present

## 2019-02-24 DIAGNOSIS — R0602 Shortness of breath: Secondary | ICD-10-CM

## 2019-02-24 DIAGNOSIS — R5381 Other malaise: Secondary | ICD-10-CM | POA: Diagnosis present

## 2019-02-24 DIAGNOSIS — M069 Rheumatoid arthritis, unspecified: Secondary | ICD-10-CM | POA: Diagnosis present

## 2019-02-24 DIAGNOSIS — Z7952 Long term (current) use of systemic steroids: Secondary | ICD-10-CM

## 2019-02-24 DIAGNOSIS — K219 Gastro-esophageal reflux disease without esophagitis: Secondary | ICD-10-CM | POA: Diagnosis present

## 2019-02-24 DIAGNOSIS — R069 Unspecified abnormalities of breathing: Secondary | ICD-10-CM | POA: Diagnosis not present

## 2019-02-24 DIAGNOSIS — G8929 Other chronic pain: Secondary | ICD-10-CM | POA: Diagnosis not present

## 2019-02-24 DIAGNOSIS — E669 Obesity, unspecified: Secondary | ICD-10-CM | POA: Diagnosis present

## 2019-02-24 DIAGNOSIS — Z951 Presence of aortocoronary bypass graft: Secondary | ICD-10-CM

## 2019-02-24 DIAGNOSIS — J9621 Acute and chronic respiratory failure with hypoxia: Secondary | ICD-10-CM | POA: Diagnosis not present

## 2019-02-24 DIAGNOSIS — Z853 Personal history of malignant neoplasm of breast: Secondary | ICD-10-CM

## 2019-02-24 DIAGNOSIS — E0842 Diabetes mellitus due to underlying condition with diabetic polyneuropathy: Secondary | ICD-10-CM | POA: Diagnosis not present

## 2019-02-24 DIAGNOSIS — J1282 Pneumonia due to coronavirus disease 2019: Secondary | ICD-10-CM | POA: Diagnosis present

## 2019-02-24 DIAGNOSIS — E1142 Type 2 diabetes mellitus with diabetic polyneuropathy: Secondary | ICD-10-CM | POA: Diagnosis not present

## 2019-02-24 DIAGNOSIS — G4733 Obstructive sleep apnea (adult) (pediatric): Secondary | ICD-10-CM | POA: Diagnosis not present

## 2019-02-24 DIAGNOSIS — U071 COVID-19: Principal | ICD-10-CM | POA: Diagnosis present

## 2019-02-24 DIAGNOSIS — Z9989 Dependence on other enabling machines and devices: Secondary | ICD-10-CM

## 2019-02-24 DIAGNOSIS — I878 Other specified disorders of veins: Secondary | ICD-10-CM | POA: Diagnosis present

## 2019-02-24 DIAGNOSIS — E1165 Type 2 diabetes mellitus with hyperglycemia: Secondary | ICD-10-CM | POA: Diagnosis present

## 2019-02-24 DIAGNOSIS — Z96653 Presence of artificial knee joint, bilateral: Secondary | ICD-10-CM | POA: Diagnosis present

## 2019-02-24 DIAGNOSIS — Z8249 Family history of ischemic heart disease and other diseases of the circulatory system: Secondary | ICD-10-CM

## 2019-02-24 DIAGNOSIS — F329 Major depressive disorder, single episode, unspecified: Secondary | ICD-10-CM | POA: Diagnosis present

## 2019-02-24 DIAGNOSIS — F419 Anxiety disorder, unspecified: Secondary | ICD-10-CM | POA: Diagnosis present

## 2019-02-24 DIAGNOSIS — C50919 Malignant neoplasm of unspecified site of unspecified female breast: Secondary | ICD-10-CM | POA: Diagnosis present

## 2019-02-24 DIAGNOSIS — I2510 Atherosclerotic heart disease of native coronary artery without angina pectoris: Secondary | ICD-10-CM | POA: Diagnosis present

## 2019-02-24 DIAGNOSIS — Z9221 Personal history of antineoplastic chemotherapy: Secondary | ICD-10-CM | POA: Diagnosis not present

## 2019-02-24 DIAGNOSIS — E0843 Diabetes mellitus due to underlying condition with diabetic autonomic (poly)neuropathy: Secondary | ICD-10-CM | POA: Diagnosis not present

## 2019-02-24 DIAGNOSIS — Z7982 Long term (current) use of aspirin: Secondary | ICD-10-CM

## 2019-02-24 DIAGNOSIS — M549 Dorsalgia, unspecified: Secondary | ICD-10-CM | POA: Diagnosis present

## 2019-02-24 DIAGNOSIS — R9431 Abnormal electrocardiogram [ECG] [EKG]: Secondary | ICD-10-CM | POA: Clinically undetermined

## 2019-02-24 DIAGNOSIS — J189 Pneumonia, unspecified organism: Secondary | ICD-10-CM | POA: Diagnosis not present

## 2019-02-24 DIAGNOSIS — E119 Type 2 diabetes mellitus without complications: Secondary | ICD-10-CM

## 2019-02-24 DIAGNOSIS — L03116 Cellulitis of left lower limb: Secondary | ICD-10-CM | POA: Diagnosis not present

## 2019-02-24 DIAGNOSIS — Z9981 Dependence on supplemental oxygen: Secondary | ICD-10-CM

## 2019-02-24 DIAGNOSIS — E1140 Type 2 diabetes mellitus with diabetic neuropathy, unspecified: Secondary | ICD-10-CM | POA: Diagnosis present

## 2019-02-24 DIAGNOSIS — Z6838 Body mass index (BMI) 38.0-38.9, adult: Secondary | ICD-10-CM

## 2019-02-24 DIAGNOSIS — Z923 Personal history of irradiation: Secondary | ICD-10-CM

## 2019-02-24 DIAGNOSIS — I10 Essential (primary) hypertension: Secondary | ICD-10-CM | POA: Diagnosis not present

## 2019-02-24 DIAGNOSIS — J45909 Unspecified asthma, uncomplicated: Secondary | ICD-10-CM | POA: Diagnosis present

## 2019-02-24 DIAGNOSIS — J9601 Acute respiratory failure with hypoxia: Secondary | ICD-10-CM

## 2019-02-24 DIAGNOSIS — Z7951 Long term (current) use of inhaled steroids: Secondary | ICD-10-CM

## 2019-02-24 DIAGNOSIS — E785 Hyperlipidemia, unspecified: Secondary | ICD-10-CM | POA: Diagnosis not present

## 2019-02-24 DIAGNOSIS — E118 Type 2 diabetes mellitus with unspecified complications: Secondary | ICD-10-CM | POA: Diagnosis not present

## 2019-02-24 DIAGNOSIS — E0841 Diabetes mellitus due to underlying condition with diabetic mononeuropathy: Secondary | ICD-10-CM | POA: Diagnosis not present

## 2019-02-24 DIAGNOSIS — R0902 Hypoxemia: Secondary | ICD-10-CM | POA: Diagnosis not present

## 2019-02-24 DIAGNOSIS — IMO0002 Reserved for concepts with insufficient information to code with codable children: Secondary | ICD-10-CM | POA: Diagnosis present

## 2019-02-24 LAB — CBC WITH DIFFERENTIAL/PLATELET
Abs Immature Granulocytes: 0.15 10*3/uL — ABNORMAL HIGH (ref 0.00–0.07)
Basophils Absolute: 0 10*3/uL (ref 0.0–0.1)
Basophils Relative: 0 %
Eosinophils Absolute: 0 10*3/uL (ref 0.0–0.5)
Eosinophils Relative: 0 %
HCT: 46.3 % — ABNORMAL HIGH (ref 36.0–46.0)
Hemoglobin: 15.3 g/dL — ABNORMAL HIGH (ref 12.0–15.0)
Immature Granulocytes: 2 %
Lymphocytes Relative: 10 %
Lymphs Abs: 0.9 10*3/uL (ref 0.7–4.0)
MCH: 30.1 pg (ref 26.0–34.0)
MCHC: 33 g/dL (ref 30.0–36.0)
MCV: 91.1 fL (ref 80.0–100.0)
Monocytes Absolute: 0.5 10*3/uL (ref 0.1–1.0)
Monocytes Relative: 5 %
Neutro Abs: 7.4 10*3/uL (ref 1.7–7.7)
Neutrophils Relative %: 83 %
Platelets: 199 10*3/uL (ref 150–400)
RBC: 5.08 MIL/uL (ref 3.87–5.11)
RDW: 12.8 % (ref 11.5–15.5)
WBC: 8.9 10*3/uL (ref 4.0–10.5)
nRBC: 0 % (ref 0.0–0.2)

## 2019-02-24 LAB — COMPREHENSIVE METABOLIC PANEL
ALT: 24 U/L (ref 0–44)
AST: 30 U/L (ref 15–41)
Albumin: 3.7 g/dL (ref 3.5–5.0)
Alkaline Phosphatase: 54 U/L (ref 38–126)
Anion gap: 17 — ABNORMAL HIGH (ref 5–15)
BUN: 18 mg/dL (ref 8–23)
CO2: 22 mmol/L (ref 22–32)
Calcium: 9 mg/dL (ref 8.9–10.3)
Chloride: 97 mmol/L — ABNORMAL LOW (ref 98–111)
Creatinine, Ser: 0.55 mg/dL (ref 0.44–1.00)
GFR calc Af Amer: >60 mL/min (ref >60)
GFR calc non Af Amer: >60 mL/min (ref >60)
Glucose, Bld: 302 mg/dL — ABNORMAL HIGH (ref 70–99)
Potassium: 3.9 mmol/L (ref 3.5–5.1)
Sodium: 136 mmol/L (ref 135–145)
Total Bilirubin: 0.5 mg/dL (ref 0.3–1.2)
Total Protein: 8 g/dL (ref 6.5–8.1)

## 2019-02-24 LAB — TRIGLYCERIDES: Triglycerides: 121 mg/dL (ref <150)

## 2019-02-24 LAB — SARS CORONAVIRUS 2 BY RT PCR (HOSPITAL ORDER, PERFORMED IN ~~LOC~~ HOSPITAL LAB): SARS Coronavirus 2: POSITIVE — AB

## 2019-02-24 LAB — LACTATE DEHYDROGENASE: LDH: 326 U/L — ABNORMAL HIGH (ref 98–192)

## 2019-02-24 LAB — FERRITIN: Ferritin: 361 ng/mL — ABNORMAL HIGH (ref 11–307)

## 2019-02-24 LAB — PROCALCITONIN: Procalcitonin: <0.1 ng/mL

## 2019-02-24 LAB — C-REACTIVE PROTEIN: CRP: 10.7 mg/dL — ABNORMAL HIGH (ref <1.0)

## 2019-02-24 LAB — D-DIMER, QUANTITATIVE: D-Dimer, Quant: 0.46 ug/mL-FEU (ref 0.00–0.50)

## 2019-02-24 LAB — LACTIC ACID, PLASMA: Lactic Acid, Venous: 1.7 mmol/L (ref 0.5–1.9)

## 2019-02-24 LAB — FIBRINOGEN: Fibrinogen: >800 mg/dL — ABNORMAL HIGH (ref 210–475)

## 2019-02-24 MED ORDER — SODIUM CHLORIDE 0.9 % IV SOLN: 250.0000 mL | INTRAVENOUS | Status: DC | PRN

## 2019-02-24 MED ORDER — AMLODIPINE BESYLATE 5 MG PO TABS
5.0000 mg | ORAL_TABLET | Freq: Every day | ORAL | Status: DC
  Administered 2019-02-24 – 2019-03-06 (×11): 5 mg via ORAL
  Filled 2019-02-24 (×11): qty 1

## 2019-02-24 MED ORDER — TRAMADOL HCL 50 MG PO TABS
100.0000 mg | ORAL_TABLET | Freq: Four times a day (QID) | ORAL | Status: DC | PRN
  Filled 2019-02-24 (×2): qty 2

## 2019-02-24 MED ORDER — SODIUM CHLORIDE 0.9% FLUSH: 3.0000 mL | INTRAVENOUS | Status: DC | PRN

## 2019-02-24 MED ORDER — ALBUTEROL SULFATE HFA 108 (90 BASE) MCG/ACT IN AERS
6.0000 | INHALATION_SPRAY | Freq: Once | RESPIRATORY_TRACT | Status: AC
  Administered 2019-02-24: 6 via RESPIRATORY_TRACT
  Filled 2019-02-24: qty 6.7

## 2019-02-24 MED ORDER — BUPROPION HCL ER (XL) 150 MG PO TB24
150.0000 mg | ORAL_TABLET | Freq: Every day | ORAL | Status: DC
  Administered 2019-02-25 – 2019-03-07 (×12): 150 mg via ORAL
  Filled 2019-02-24 (×13): qty 1

## 2019-02-24 MED ORDER — DEXAMETHASONE SODIUM PHOSPHATE 10 MG/ML IJ SOLN
10.0000 mg | Freq: Once | INTRAMUSCULAR | Status: AC
  Administered 2019-02-24: 22:00:00 10 mg via INTRAVENOUS
  Filled 2019-02-24: qty 1

## 2019-02-24 MED ORDER — SODIUM CHLORIDE 0.9 % IV SOLN
100.0000 mg | INTRAVENOUS | Status: AC
  Administered 2019-02-25 – 2019-02-28 (×4): 100 mg via INTRAVENOUS
  Filled 2019-02-24 (×4): qty 20

## 2019-02-24 MED ORDER — LABETALOL HCL 5 MG/ML IV SOLN
10.0000 mg | INTRAVENOUS | Status: DC | PRN
  Administered 2019-02-25: 10 mg via INTRAVENOUS
  Filled 2019-02-24: qty 4

## 2019-02-24 MED ORDER — SODIUM CHLORIDE 0.9% FLUSH
3.0000 mL | Freq: Two times a day (BID) | INTRAVENOUS | Status: DC
  Administered 2019-02-25: 01:00:00 via INTRAVENOUS

## 2019-02-24 MED ORDER — FOLIC ACID 1 MG PO TABS
2.0000 mg | ORAL_TABLET | Freq: Every day | ORAL | Status: DC
  Administered 2019-02-25 – 2019-03-07 (×12): 2 mg via ORAL
  Filled 2019-02-24 (×13): qty 2

## 2019-02-24 MED ORDER — SODIUM CHLORIDE 0.45 % IV SOLN
INTRAVENOUS | Status: DC
  Administered 2019-02-25: via INTRAVENOUS

## 2019-02-24 MED ORDER — BUDESONIDE 180 MCG/ACT IN AEPB
1.0000 | INHALATION_SPRAY | Freq: Two times a day (BID) | RESPIRATORY_TRACT | Status: DC
  Administered 2019-02-25 – 2019-03-07 (×22): 1 via RESPIRATORY_TRACT
  Filled 2019-02-24: qty 1

## 2019-02-24 MED ORDER — FLUTICASONE PROPIONATE 50 MCG/ACT NA SUSP
2.0000 | Freq: Two times a day (BID) | NASAL | Status: DC
  Administered 2019-02-25 – 2019-03-07 (×22): 2 via NASAL
  Filled 2019-02-24: qty 16

## 2019-02-24 MED ORDER — DEXAMETHASONE 6 MG PO TABS
6.0000 mg | ORAL_TABLET | Freq: Every day | ORAL | Status: DC
  Administered 2019-02-25 – 2019-02-27 (×4): 6 mg via ORAL
  Filled 2019-02-24 (×5): qty 1

## 2019-02-24 MED ORDER — HYDROCHLOROTHIAZIDE 25 MG PO TABS
25.0000 mg | ORAL_TABLET | Freq: Every day | ORAL | Status: DC
  Administered 2019-02-24 – 2019-03-03 (×8): 25 mg via ORAL
  Filled 2019-02-24 (×9): qty 1

## 2019-02-24 MED ORDER — ASPIRIN 81 MG PO CHEW
81.0000 mg | CHEWABLE_TABLET | Freq: Every day | ORAL | Status: DC
  Administered 2019-02-24 – 2019-03-06 (×11): 81 mg via ORAL
  Filled 2019-02-24 (×12): qty 1

## 2019-02-24 MED ORDER — NEBIVOLOL HCL 10 MG PO TABS
10.0000 mg | ORAL_TABLET | Freq: Every day | ORAL | Status: DC
  Administered 2019-02-24 – 2019-03-07 (×12): 10 mg via ORAL
  Filled 2019-02-24 (×13): qty 1

## 2019-02-24 MED ORDER — BACLOFEN 10 MG PO TABS
10.0000 mg | ORAL_TABLET | Freq: Two times a day (BID) | ORAL | Status: DC
  Administered 2019-02-25 – 2019-03-07 (×22): 10 mg via ORAL
  Filled 2019-02-24 (×25): qty 1

## 2019-02-24 MED ORDER — BUSPIRONE HCL 7.5 MG PO TABS
7.5000 mg | ORAL_TABLET | Freq: Two times a day (BID) | ORAL | Status: DC
  Administered 2019-02-25 – 2019-03-07 (×22): 7.5 mg via ORAL
  Filled 2019-02-24 (×2): qty 1
  Filled 2019-02-24: qty 1.5
  Filled 2019-02-24 (×3): qty 1
  Filled 2019-02-24: qty 1.5
  Filled 2019-02-24: qty 1
  Filled 2019-02-24 (×2): qty 1.5
  Filled 2019-02-24: qty 1
  Filled 2019-02-24: qty 1.5
  Filled 2019-02-24: qty 1
  Filled 2019-02-24: qty 1.5
  Filled 2019-02-24 (×10): qty 1

## 2019-02-24 MED ORDER — ENOXAPARIN SODIUM 40 MG/0.4ML ~~LOC~~ SOLN
40.0000 mg | SUBCUTANEOUS | Status: DC
  Administered 2019-02-25 – 2019-02-26 (×2): 40 mg via SUBCUTANEOUS
  Filled 2019-02-24 (×2): qty 0.4

## 2019-02-24 MED ORDER — DOXYCYCLINE HYCLATE 100 MG PO TABS
100.0000 mg | ORAL_TABLET | Freq: Two times a day (BID) | ORAL | Status: DC
  Administered 2019-02-24: 100 mg via ORAL
  Filled 2019-02-24 (×2): qty 1

## 2019-02-24 MED ORDER — ACETAMINOPHEN 325 MG PO TABS
650.0000 mg | ORAL_TABLET | Freq: Once | ORAL | Status: AC
  Administered 2019-02-24: 650 mg via ORAL
  Filled 2019-02-24: qty 2

## 2019-02-24 MED ORDER — SODIUM CHLORIDE 0.9% FLUSH
3.0000 mL | Freq: Two times a day (BID) | INTRAVENOUS | Status: DC
  Administered 2019-02-25 – 2019-03-07 (×12): 3 mL via INTRAVENOUS

## 2019-02-24 MED ORDER — NYSTATIN 100000 UNIT/ML MT SUSP
5.0000 mL | Freq: Four times a day (QID) | OROMUCOSAL | Status: DC
  Administered 2019-02-25 – 2019-03-07 (×42): 500000 [IU] via ORAL
  Filled 2019-02-24 (×47): qty 5

## 2019-02-24 MED ORDER — TRIAMCINOLONE ACETONIDE 0.5 % EX CREA
TOPICAL_CREAM | Freq: Two times a day (BID) | CUTANEOUS | Status: DC
  Administered 2019-02-25 – 2019-03-01 (×9): via TOPICAL
  Administered 2019-03-01: 1 via TOPICAL
  Administered 2019-03-02 – 2019-03-07 (×11): via TOPICAL
  Filled 2019-02-24: qty 15

## 2019-02-24 MED ORDER — DULOXETINE HCL 60 MG PO CPEP
60.0000 mg | ORAL_CAPSULE | Freq: Every day | ORAL | Status: DC
  Administered 2019-02-25 – 2019-03-07 (×12): 60 mg via ORAL
  Filled 2019-02-24 (×4): qty 1
  Filled 2019-02-24: qty 2
  Filled 2019-02-24 (×3): qty 1
  Filled 2019-02-24 (×3): qty 2
  Filled 2019-02-24 (×2): qty 1

## 2019-02-24 MED ORDER — SODIUM CHLORIDE 0.9 % IV SOLN
200.0000 mg | Freq: Once | INTRAVENOUS | Status: AC
  Administered 2019-02-25: 200 mg via INTRAVENOUS
  Filled 2019-02-24: qty 40

## 2019-02-24 MED ORDER — ACETAMINOPHEN 500 MG PO TABS: 1000.0000 mg | ORAL_TABLET | Freq: Two times a day (BID) | ORAL | Status: DC | PRN

## 2019-02-24 MED ORDER — OLMESARTAN MEDOXOMIL-HCTZ 40-25 MG PO TABS: 1.0000 | ORAL_TABLET | Freq: Every day | ORAL | Status: DC

## 2019-02-24 MED ORDER — GABAPENTIN 300 MG PO CAPS
600.0000 mg | ORAL_CAPSULE | Freq: Three times a day (TID) | ORAL | Status: DC
  Administered 2019-02-25 – 2019-03-07 (×32): 600 mg via ORAL
  Filled 2019-02-24 (×32): qty 2

## 2019-02-24 MED ORDER — GALANTAMINE HYDROBROMIDE ER 8 MG PO CP24
16.0000 mg | ORAL_CAPSULE | Freq: Every day | ORAL | Status: DC
  Administered 2019-02-25 – 2019-03-07 (×11): 16 mg via ORAL
  Filled 2019-02-24 (×12): qty 2

## 2019-02-24 MED ORDER — IRBESARTAN 300 MG PO TABS
300.0000 mg | ORAL_TABLET | Freq: Every day | ORAL | Status: DC
  Administered 2019-02-24 – 2019-03-07 (×12): 300 mg via ORAL
  Filled 2019-02-24 (×13): qty 1

## 2019-02-24 MED ORDER — DOXYCYCLINE HYCLATE 100 MG PO TABS
100.0000 mg | ORAL_TABLET | Freq: Two times a day (BID) | ORAL | Status: DC
  Filled 2019-02-24: qty 1

## 2019-02-24 MED ORDER — INSULIN ASPART 100 UNIT/ML ~~LOC~~ SOLN
0.0000 [IU] | SUBCUTANEOUS | Status: DC
  Administered 2019-02-25: 11 [IU] via SUBCUTANEOUS
  Administered 2019-02-25: 04:00:00 15 [IU] via SUBCUTANEOUS
  Filled 2019-02-24: qty 0.2

## 2019-02-24 MED ORDER — HYDROXYCHLOROQUINE SULFATE 200 MG PO TABS
200.0000 mg | ORAL_TABLET | Freq: Two times a day (BID) | ORAL | Status: DC
  Administered 2019-02-25 – 2019-03-07 (×21): 200 mg via ORAL
  Filled 2019-02-24 (×21): qty 1

## 2019-02-24 MED ORDER — TOCILIZUMAB 400 MG/20ML IV SOLN
800.0000 mg | Freq: Once | INTRAVENOUS | Status: AC
  Administered 2019-02-25: 800 mg via INTRAVENOUS
  Filled 2019-02-24: qty 40

## 2019-02-24 MED ORDER — METOCLOPRAMIDE HCL 5 MG/ML IJ SOLN
10.0000 mg | Freq: Once | INTRAMUSCULAR | Status: AC
  Administered 2019-02-24: 10 mg via INTRAVENOUS
  Filled 2019-02-24: qty 2

## 2019-02-24 MED ORDER — SENNA 8.6 MG PO TABS
1.0000 | ORAL_TABLET | Freq: Two times a day (BID) | ORAL | Status: DC
  Administered 2019-02-25 – 2019-03-01 (×6): 8.6 mg via ORAL
  Filled 2019-02-24 (×8): qty 1

## 2019-02-24 MED ORDER — BUDESONIDE 0.5 MG/2ML IN SUSP
0.5000 mg | Freq: Two times a day (BID) | RESPIRATORY_TRACT | Status: DC
  Filled 2019-02-24: qty 2

## 2019-02-24 MED ORDER — PANTOPRAZOLE SODIUM 40 MG PO TBEC
40.0000 mg | DELAYED_RELEASE_TABLET | Freq: Every day | ORAL | Status: DC
  Administered 2019-02-25 – 2019-03-07 (×12): 40 mg via ORAL
  Filled 2019-02-24 (×12): qty 1

## 2019-02-24 NOTE — Progress Notes (Signed)
Report received from [place]

## 2019-02-24 NOTE — H&P (Signed)
History and Physical    AMI Shelton LDJ:[id number] DOB: 07-03-1946 DOA: 02/24/2019  PCP: Jamie Infante, MD   Patient coming from: Patient is coming from home-l;ives in daughter's house  I have personally briefly reviewed patient's old medical records in [place]  Chief Complaint: Aggressive shortness of breath  HPI: [name] is a 73 y.o. female with history of asthma on 2 L of oxygen comes in with chief complaint of shortness of breath.  She also has history of CAD, diabetes.  Patient states that on Monday she started having some nasal congestion.  She followed up with her PCP and had a test for COVID-19 that day which came back positive on Wednesday.  Over the last couple of days she has had increased shortness of breath and decided to come to the ER.  According to EMS patient's O2 sats were in the low 80s when they arrived.  She is currently on 15 L/nonrebreather.  She is having shortness of breath with exertion and feeling of dizziness.  She has a mild cough.  Patient is also having subjective low-grade temps and weakness.  Pt has no hx of PE, DVT and denies any exogenous hormone (testosterone / estrogen) use, long distance travels or surgery in the past 6 weeks, active cancer, recent immobilization.note).  ED Course: In the emergency department patient had routine labs drawn which revealed elevation in inflammatory markers.  He was put to 15 L high flow nasal cannula improvement of her oxygen saturations to greater than 90%.  X-ray was performed which revealed a 19 pneumonia type changes.  She did complain of a sniffing and headache and she was given dexamethasone 10 mg IV along with a headache cocktail.  Patient referred for admission to [facility] to a progressive bed due to need for high flow nasal cannula oxygen.  Review of Systems: As per HPI otherwise 10 point review of systems negative. She does admit to cough that is non-productive, endorses nauses, light-headedness,  continued headache.   Past Medical History:  Diagnosis Date  . Actinic keratosis   . Asthma    Albuterol inhaler as needed.Pulmicort neb as needed  . Asthma   . Breast cancer ([place])    right - lumpectomy   . Chronic back pain    spinal stenosis  . Coronary artery disease   . Dependence on nocturnal oxygen therapy 07/10/2017   Pt uses 2 liters 02 at night   . Depression    takes CYmbalta and Wellbutrin daily  . Diabetes mellitus    not on any meds/controlled by diet  . Dyspnea    with exertion occasionally when lies down  . GERD (gastroesophageal reflux disease)    takes Omeprazole daily  . Headache    oocasionally  . History of kidney stones   . Hyperlipidemia    not on any meds  . Hypertension    takes Benazepril,Bystolic,and Amlodipine daily  . IBS (irritable bowel syndrome)   . Joint pain   . Joint swelling   . Livedoid vasculitis   . Nocturia   . OA (osteoarthritis) of knee   . OSA (obstructive sleep apnea)   . Other seborrheic keratosis   . Oxygen deficiency    2 liters at night per pt for OSA- no cpap   . Peripheral edema    takes daily as needed  . Peripheral neuropathy   . Personal history of chemotherapy   . Personal history of radiation therapy   .  Pneumonia    hx of-several yrs ago  . RA (rheumatoid arthritis) ([place])   . Sleep apnea    pt states she uses oxygen at night - no cpap- uses 02 2 liters at night   . Urinary frequency   . Urinary urgency   . Weakness    numbness and tingling mainly in left leg occasionally in right.Tingling/numbness in hands    Past Surgical History:  Procedure Laterality Date  . BREAST LUMPECTOMY  1997   RIGHT BREAST  . CARDIAC CATHETERIZATION  04/23/2004   EF 60%  . cataract surgery Bilateral   . COLONOSCOPY    . COLONOSCOPY WITH PROPOFOL N/A 08/07/2017   Procedure: COLONOSCOPY WITH PROPOFOL;  Surgeon: Jamie Artist, MD;  Location: [facility];  Service: Endoscopy;  Laterality: N/A;  . CORONARY ARTERY BYPASS  GRAFT  2005   LIMA GRAFT TO LAD, SAPHENOUS VEIN GRAFT TO THE FIRST DIAGONAL, AND LEFT RADIAL ARTERY GRAFT TO THE OM  . ESOPHAGOGASTRODUODENOSCOPY (EGD) WITH PROPOFOL N/A 08/07/2017   Procedure: ESOPHAGOGASTRODUODENOSCOPY (EGD) WITH PROPOFOL;  Surgeon: Jamie Artist, MD;  Location: [facility];  Service: Endoscopy;  Laterality: N/A;  . FOOT SURGERY Right   . JOINT REPLACEMENT    . LITHOTRIPSY    . LUMBAR LAMINECTOMY/DECOMPRESSION MICRODISCECTOMY N/A 09/08/2015   Procedure: CENTRAL DECOMPRESSIVE LUMBAR LAMINECTOMIES L3-4, L4-5 AND BILATERAL HEMILAMINECTOMY L5-S1;  Surgeon: Jamie Oto, MD;  Location: [place];  Service: Orthopedics;  Laterality: N/A;  . nodule removed from left elbow    . SHOULDER ARTHROSCOPY    . TOTAL KNEE ARTHROPLASTY Bilateral   . TOTAL SHOULDER ARTHROPLASTY    . TRIPLE BYPASS  04/27/05  . US ECHOCARDIOGRAPHY  01/06/2009   EF 55-60%  . WRIST SURGERY Left      reports that she has never smoked. She has never used smokeless tobacco. She reports that she does not drink alcohol or use drugs.  Allergies  Allergen Reactions  . Statins Other (See Comments)    Joint pain and swelling/burning  . Morphine And Related Other (See Comments)    hallucinations    Family History  Problem Relation Age of Onset  . Heart disease Mother   . Cancer Father   . Cancer Brother   . Heart disease Brother    Social history - widowed after a long marriage. Lives at her daughters house. Has several children and extended family.  Prior to Admission medications   Medication Sig Start Date End Date Taking? Authorizing Provider  acetaminophen (TYLENOL) 500 MG tablet Take 1,000 mg by mouth 2 (two) times daily as needed for moderate pain.   Yes [provider]  albuterol (PROVENTIL HFA;VENTOLIN HFA) 108 (90 BASE) MCG/ACT inhaler Inhale 2 puffs into the lungs every 4 (four) hours as needed for wheezing or shortness of breath (cough, shortness of breath or wheezing.). 07/22/13  Yes  Jamie Culver, MD  amLODipine (NORVASC) 5 MG tablet Take 5 mg by mouth Daily.  02/24/12  Yes [provider]  aspirin 81 MG tablet Take 81 mg by mouth at bedtime.    Yes [provider]  baclofen (LIORESAL) 10 MG tablet TAKE 1 TABLET BY MOUTH TWICE A DAY AS NEEDED FOR SPASMS Patient taking differently: Take 10 mg by mouth 2 (two) times daily.  03/21/17  Yes Jamie Oto, MD  betamethasone dipropionate (DIPROLENE) 0.05 % ointment Apply 1 application topically daily.  01/01/19  Yes [provider]  budesonide (PULMICORT) 0.5 MG/2ML nebulizer solution Take  0.5 mg by nebulization 2 (two) times a day.    Yes [provider]  buPROPion (WELLBUTRIN XL) 150 MG 24 hr tablet Take 150 mg by mouth daily.  08/28/16  Yes [provider]  busPIRone (BUSPAR) 7.5 MG tablet Take 7.5 mg by mouth 2 (two) times daily.  07/22/16  Yes [provider]  dexamethasone (DECADRON) 6 MG tablet Take 6 mg by mouth daily.   Yes [provider]  doxycycline (MONODOX) 100 MG capsule Take 100 mg by mouth 2 (two) times daily. 11/23/17  Yes [provider]  DULoxetine (CYMBALTA) 60 MG capsule Take 60 mg by mouth daily. 11/19/18  Yes [provider]  fluticasone (FLONASE) 50 MCG/ACT nasal spray Place 2 sprays into both nostrils 2 (two) times a day.   Yes [provider]  folic acid (FOLVITE) 1 MG tablet Take 2 tablets (2 mg total) by mouth daily. 04/25/18 07/19/19 Yes Deveshwar, Abel Presto, MD  gabapentin (NEURONTIN) 300 MG capsule Take 600 mg by mouth 3 (three) times daily.  08/26/13  Yes [provider]  galantamine (RAZADYNE ER) 16 MG 24 hr capsule Take 16 mg by mouth daily with breakfast.  11/16/17  Yes [provider]  hydroxychloroquine (PLAQUENIL) 200 MG tablet TAKE 1 TABLET BY MOUTH TWICE A DAY 01/01/19  Yes Deveshwar, Abel Presto, MD  meloxicam (MOBIC) 15 MG tablet Take 15 mg by mouth daily.     Yes [provider]  Multiple  Vitamins-Minerals (ZINC PO) Take 1 tablet by mouth daily.   Yes [provider]  nebivolol (BYSTOLIC) 10 MG tablet Take 10 mg by mouth daily.     Yes [provider]  nystatin (MYCOSTATIN) 100000 UNIT/ML suspension Take 5 mLs by mouth 4 (four) times daily. For 10 days   Yes [provider]  olmesartan-hydrochlorothiazide (BENICAR HCT) 40-25 MG tablet Take 1 tablet by mouth daily.  02/28/18  Yes [provider]  omeprazole (PRILOSEC) 20 MG capsule Take 20 mg by mouth daily.   Yes [provider]    Physical Exam: Vitals:   02/24/19 1551 02/24/19 1724 02/24/19 1826  BP: (!) 159/81 (!) 136/119 (!) 138/98  Pulse:  77 72  Resp: 18 18 18   Temp: 98.7 F (37.1 C)    TempSrc: Oral    SpO2: 92% 91% 90%    Constitutional: NAD, calm, comfortable Vitals:   02/24/19 1551 02/24/19 1724 02/24/19 1826  BP: (!) 159/81 (!) 136/119 (!) 138/98  Pulse:  77 72  Resp: 18 18 18   Temp: 98.7 F (37.1 C)    TempSrc: Oral    SpO2: 92% 91% 90%   General -  Obese woman who is mildly confused, short of breath. Eyes: PERRL, lids and conjunctivae normal ENMT: Mucous membranes are moist. Posterior pharynx clear of any exudate or lesions.Normal dentition.  Neck: normal, supple, no masses, no thyromegaly Respiratory: increased WOB, bibasilar crackles w/o wheezing.. Without Neck retractions Cardiovascular: Regular rate and rhythm, no murmurs / rubs / gallops. No extremity edema. 2+ pedal pulses. No carotid bruits.  Abdomen:obese, no tenderness, no masses palpated. No hepatosplenomegaly. Bowel sounds positive.  Musculoskeletal: no clubbing / cyanosis. Deformation MCP joints, mildly subluxed PIP right thumb. Good ROM, no contractures. Decreased muscle tone.  Skin: no rashes. Wound that is bandaged distal LLE. Chronic skin changes distal LLE. Neurologic: CN 2-12 grossly intact. Sensation intact. Strength 4/5 in all 4.  Psychiatric: Fair judgment and insight. Alert and  oriented x 2, needed to be oriented to  place. Seems mildly confused. Anxious mood.   Labs on Admission: I have personally reviewed following labs and imaging studies  CBC: Recent Labs  Lab 02/24/19 1630  WBC 8.9  NEUTROABS 7.4  HGB 15.3*  HCT 46.3*  MCV 91.1  PLT 811   Basic Metabolic Panel: Recent Labs  Lab 02/24/19 1630  NA 136  K 3.9  CL 97*  CO2 22  GLUCOSE 302*  BUN 18  CREATININE 0.55  CALCIUM 9.0   GFR: CrCl cannot be calculated (Unknown ideal weight.). Liver Function Tests: Recent Labs  Lab 02/24/19 1630  AST 30  ALT 24  ALKPHOS 54  BILITOT 0.5  PROT 8.0  ALBUMIN 3.7   No results for input(s): LIPASE, AMYLASE in the last 168 hours. No results for input(s): AMMONIA in the last 168 hours. Coagulation Profile: No results for input(s): INR, PROTIME in the last 168 hours. Cardiac Enzymes: No results for input(s): CKTOTAL, CKMB, CKMBINDEX, TROPONINI in the last 168 hours. BNP (last 3 results) No results for input(s): PROBNP in the last 8760 hours. HbA1C: No results for input(s): HGBA1C in the last 72 hours. CBG: No results for input(s): GLUCAP in the last 168 hours. Lipid Profile: Recent Labs    02/24/19 1630  TRIG 121   Thyroid Function Tests: No results for input(s): TSH, T4TOTAL, FREET4, T3FREE, THYROIDAB in the last 72 hours. Anemia Panel: Recent Labs    02/24/19 1630  FERRITIN 361*   Urine analysis:    Component Value Date/Time   COLORURINE YELLOW 04/08/2011 0950   APPEARANCEUR CLOUDY (A) 04/08/2011 0950   LABSPEC 1.034 (H) 04/08/2011 0950   PHURINE 6.0 04/08/2011 0950   GLUCOSEU NEGATIVE 04/08/2011 0950   HGBUR NEGATIVE 04/08/2011 0950   BILIRUBINUR NEGATIVE 04/08/2011 0950   KETONESUR NEGATIVE 04/08/2011 0950   PROTEINUR NEGATIVE 04/08/2011 0950   UROBILINOGEN 0.2 04/08/2011 0950   NITRITE NEGATIVE 04/08/2011 0950   LEUKOCYTESUR NEGATIVE 04/08/2011 0950    Radiological Exams on Admission: Dg Chest Port 1 View  Result  Date: 02/24/2019 CLINICAL DATA:  Shortness of breath.  Covid positive. EXAM: PORTABLE CHEST 1 VIEW COMPARISON:  11/27/2017 FINDINGS: Previous median sternotomy. Heart size is enlarged. Left mid lung and left base airspace opacities are new from the previous exam compatible with multifocal pneumonia. The right lung appears clear. No edema or effusion. IMPRESSION: 1. Multifocal airspace densities within the left lung compatible with infection. Electronically Signed   By: Kerby Moors M.D.   On: 02/24/2019 17:26    EKG: Independently reviewed. NSR, occasional PAC, no acute changes  Assessment/Plan Active Problems:   Pneumonia due to COVID-19 virus   Hypertension   Diabetes mellitus, type II (HCC)   OSA (obstructive sleep apnea)    1. Pneumonia Covid 19- tachypnea, increased WOB, LLL infiltrate but no leukocytosis c/w viral respiratory infection. Requiring high flow [place] at 15 L to maintain O2 sat > 90%. Infl. ammatory markers elevatedMultiple co-mobidities including Asthma and RA putting her at increased risk for further decline. Plan Admit to [facility] to progressive bed due respiratory instability  Remdesivir - pharmacy order placed  Actemra  2. Diabetes - patient reports diet management alone. No recent A1C in chart - but her PCP most certainly has follow this. At admission glucose >300 Plan CBG AC with sliding scale, for persistent elevation will start basal insulin  3. Hypertension - continue home medications.  4. RA - will continue home medications except for NSAID  Examiner did wear full PPE: double gloved, gown,  N-95 mask, surgical mask, face-shield. Gowned and doffed in appropriate fashion.   DVT prophylaxis: lovenox  Code Status: full code (Full/Partial (specify details) Family Communication: spoke with daughter Lattie Haw - both patient and daughter understand diagnosis and treament plan and agree (Specify name, relationship. Do not write "discussed with patient". Specify tel # if discussed  over the phone) Disposition Plan: home in 5-7 days (specify when and where you expect patient to be discharged) Consults called: none (with names) Admission status: SDU (inpatient / obs / tele / medical floor / SDU)   Adella Hare MD [facility] Pager [phone]  If 7PM-7AM, please contact night-coverage [email or web address] Password [facility]  02/24/2019, 8:54 PM

## 2019-02-24 NOTE — ED Notes (Signed)
ED TO INPATIENT HANDOFF REPORT  Name/Age/Gender [name] 73 y.o. female  Code Status    Code Status Orders  (From admission, onward)         Start     Ordered   02/24/19 2043  Full code  Continuous     02/24/19 2053        Code Status History    This patient has a current code status but no historical code status.   Advance Care Planning Activity      Home/SNF/Other Home  Chief Complaint covid+; shob  Level of Care/Admitting Diagnosis ED Disposition    ED Disposition Condition [facility] Area: [place] [[id number]]  Level of Care: Progressive [102]  Covid Evaluation: Confirmed COVID Positive  Diagnosis: Pneumonia due to COVID-19 virus [[id number]]  Admitting Physician: Neena Rhymes [[id number]]  Attending Physician: Adella Hare E [[id number]]  Estimated length of stay: 5 - 7 days  Certification:: I certify this patient will need inpatient services for at least 2 midnights  PT Class (Do Not Modify): Inpatient [101]  PT Acc Code (Do Not Modify): Private [1]       Medical History Past Medical History:  Diagnosis Date  . Actinic keratosis   . Asthma    Albuterol inhaler as needed.Pulmicort neb as needed  . Asthma   . Breast cancer ([place])    right - lumpectomy   . Chronic back pain    spinal stenosis  . Coronary artery disease   . Dependence on nocturnal oxygen therapy 07/10/2017   Pt uses 2 liters 02 at night   . Depression    takes CYmbalta and Wellbutrin daily  . Diabetes mellitus    not on any meds/controlled by diet  . Dyspnea    with exertion occasionally when lies down  . GERD (gastroesophageal reflux disease)    takes Omeprazole daily  . Headache    oocasionally  . History of kidney stones   . Hyperlipidemia    not on any meds  . Hypertension    takes Benazepril,Bystolic,and Amlodipine daily  . IBS (irritable bowel syndrome)   . Joint pain   . Joint swelling   . Livedoid vasculitis   . Nocturia    . OA (osteoarthritis) of knee   . OSA (obstructive sleep apnea)   . Other seborrheic keratosis   . Oxygen deficiency    2 liters at night per pt for OSA- no cpap   . Peripheral edema    takes daily as needed  . Peripheral neuropathy   . Personal history of chemotherapy   . Personal history of radiation therapy   . Pneumonia    hx of-several yrs ago  . RA (rheumatoid arthritis) ([place])   . Sleep apnea    pt states she uses oxygen at night - no cpap- uses 02 2 liters at night   . Urinary frequency   . Urinary urgency   . Weakness    numbness and tingling mainly in left leg occasionally in right.Tingling/numbness in hands    Allergies Allergies  Allergen Reactions  . Statins Other (See Comments)    Joint pain and swelling/burning  . Morphine And Related Other (See Comments)    hallucinations    IV Location/Drains/Wounds Patient Lines/Drains/Airways Status   Active Line/Drains/Airways    Name:   Placement date:   Placement time:   Site:   Days:   Peripheral IV 02/24/19 Left Forearm  02/24/19    1653    Forearm   less than 1   Incision (Closed) 09/08/15 Back Other (Comment)   09/08/15    1653     1265          Labs/Imaging Results for orders placed or performed during the hospital encounter of 02/24/19 (from the past 48 hour(s))  SARS Coronavirus 2 [facility] order, Performed in [facility] hospital lab) Nasopharyngeal Nasopharyngeal Swab     Status: Abnormal   Collection Time: 02/24/19  4:30 PM   Specimen: Nasopharyngeal Swab  Result Value Ref Range   SARS Coronavirus 2 POSITIVE (A) NEGATIVE    Comment: RESULT CALLED TO, READ BACK BY AND VERIFIED WITH: Jenavee Laguardia,C RN @2003  ON 02/24/2019 JACKSON,K (NOTE) If result is NEGATIVE SARS-CoV-2 target nucleic acids are NOT DETECTED. The SARS-CoV-2 RNA is generally detectable in upper and lower  respiratory specimens during the acute phase of infection. The lowest  concentration of SARS-CoV-2 viral copies this assay can detect is  250  copies / mL. A negative result does not preclude SARS-CoV-2 infection  and should not be used as the sole basis for treatment or other  patient management decisions.  A negative result may occur with  improper specimen collection / handling, submission of specimen other  than nasopharyngeal swab, presence of viral mutation(s) within the  areas targeted by this assay, and inadequate number of viral copies  (<250 copies / mL). A negative result must be combined with clinical  observations, patient history, and epidemiological information. If result is POSITIVE SARS-CoV-2 target nucleic acids are DETECTED . The SARS-CoV-2 RNA is generally detectable in upper and lower  respiratory specimens during the acute phase of infection.  Positive  results are indicative of active infection with SARS-CoV-2.  Clinical  correlation with patient history and other diagnostic information is  necessary to determine patient infection status.  Positive results do  not rule out bacterial infection or co-infection with other viruses. If result is PRESUMPTIVE POSTIVE SARS-CoV-2 nucleic acids MAY BE PRESENT.   A presumptive positive result was obtained on the submitted specimen  and confirmed on repeat testing.  While 2019 novel coronavirus  (SARS-CoV-2) nucleic acids may be present in the submitted sample  additional confirmatory testing may be necessary for epidemiological  and / or clinical management purposes  to differentiate between  SARS-CoV-2 and other Sarbecovirus currently known to infect humans.  If clinically indicated additional testing with an alternate test  methodology [phone] ) is advised. The SARS-CoV-2 RNA is generally  detectable in upper and lower respiratory specimens during the acute  phase of infection. The expected result is Negative. Fact Sheet for Patients:  StrictlyIdeas.no Fact Sheet for Healthcare  Providers: BankingDealers.co.za This test is not yet approved or cleared by the [place] FDA and has been authorized for detection and/or diagnosis of SARS-CoV-2 by FDA under an Emergency Use Authorization (EUA).  This EUA will remain in effect (meaning this test can be used) for the duration of the COVID-19 declaration under Section 564(b)(1) of the Act, 21 U.S.C. section 360bbb-3(b)(1), unless the authorization is terminated or revoked sooner. Performed at [facility], [place] [address], [place], [place] [address]   Lactic acid, plasma     Status: None   Collection Time: 02/24/19  4:30 PM  Result Value Ref Range   Lactic Acid, Venous 1.7 0.5 - 1.9 mmol/L    Comment: Performed at [facility], [place] [address], [place], [place] [address]  CBC WITH DIFFERENTIAL     Status: Abnormal   Collection Time: 02/24/19  4:30 PM  Result Value Ref Range   WBC 8.9 4.0 - 10.5 K/uL   RBC 5.08 3.87 - 5.11 MIL/uL   Hemoglobin 15.3 (H) 12.0 - 15.0 g/dL   HCT 46.3 (H) 36.0 - 46.0 %   MCV 91.1 80.0 - 100.0 fL   MCH 30.1 26.0 - 34.0 pg   MCHC 33.0 30.0 - 36.0 g/dL   RDW 12.8 11.5 - 15.5 %   Platelets 199 150 - 400 K/uL   nRBC 0.0 0.0 - 0.2 %   Neutrophils Relative % 83 %   Neutro Abs 7.4 1.7 - 7.7 K/uL   Lymphocytes Relative 10 %   Lymphs Abs 0.9 0.7 - 4.0 K/uL   Monocytes Relative 5 %   Monocytes Absolute 0.5 0.1 - 1.0 K/uL   Eosinophils Relative 0 %   Eosinophils Absolute 0.0 0.0 - 0.5 K/uL   Basophils Relative 0 %   Basophils Absolute 0.0 0.0 - 0.1 K/uL   Immature Granulocytes 2 %   Abs Immature Granulocytes 0.15 (H) 0.00 - 0.07 K/uL    Comment: Performed at [facility], [place] [address] [place], [place] [address]  Comprehensive metabolic panel     Status: Abnormal   Collection Time: 02/24/19  4:30 PM  Result Value Ref Range   Sodium 136 135 - 145 mmol/L   Potassium 3.9 3.5 - 5.1 mmol/L   Chloride 97 (L) 98 -  111 mmol/L   CO2 22 22 - 32 mmol/L   Glucose, Bld 302 (H) 70 - 99 mg/dL   BUN 18 8 - 23 mg/dL   Creatinine, Ser 0.55 0.44 - 1.00 mg/dL   Calcium 9.0 8.9 - 10.3 mg/dL   Total Protein 8.0 6.5 - 8.1 g/dL   Albumin 3.7 3.5 - 5.0 g/dL   AST 30 15 - 41 U/L   ALT 24 0 - 44 U/L   Alkaline Phosphatase 54 38 - 126 U/L   Total Bilirubin 0.5 0.3 - 1.2 mg/dL   GFR calc non Af Amer >60 >60 mL/min   GFR calc Af Amer >60 >60 mL/min   Anion gap 17 (H) 5 - 15    Comment: Performed at [facility], [place] [address], [place], [place] [address]  D-dimer, quantitative     Status: None   Collection Time: 02/24/19  4:30 PM  Result Value Ref Range   D-Dimer, Quant 0.46 0.00 - 0.50 ug/mL-FEU    Comment: (NOTE) At the manufacturer cut-off of 0.50 ug/mL FEU, this assay has been documented to exclude PE with a sensitivity and negative predictive value of 97 to 99%.  At this time, this assay has not been approved by the FDA to exclude DVT/VTE. Results should be correlated with clinical presentation. Performed at [facility], [place] [address] [place], [place] [address]   Procalcitonin     Status: None   Collection Time: 02/24/19  4:30 PM  Result Value Ref Range   Procalcitonin <0.10 ng/mL    Comment:        Interpretation: PCT (Procalcitonin) <= 0.5 ng/mL: Systemic infection (sepsis) is not likely. Local bacterial infection is possible. (NOTE)       Sepsis PCT Algorithm           Lower Respiratory Tract  Infection PCT Algorithm    ----------------------------     ----------------------------         PCT < 0.25 ng/mL                PCT < 0.10 ng/mL         Strongly encourage             Strongly discourage   discontinuation of antibiotics    initiation of antibiotics    ----------------------------     -----------------------------       PCT 0.25 - 0.50 ng/mL            PCT 0.10 - 0.25 ng/mL               OR       >80% decrease  in PCT            Discourage initiation of                                            antibiotics      Encourage discontinuation           of antibiotics    ----------------------------     -----------------------------         PCT >= 0.50 ng/mL              PCT 0.26 - 0.50 ng/mL               AND        <80% decrease in PCT             Encourage initiation of                                             antibiotics       Encourage continuation           of antibiotics    ----------------------------     -----------------------------        PCT >= 0.50 ng/mL                  PCT > 0.50 ng/mL               AND         increase in PCT                  Strongly encourage                                      initiation of antibiotics    Strongly encourage escalation           of antibiotics                                     -----------------------------                                           PCT <= 0.25 ng/mL  OR                                        > 80% decrease in PCT                                     Discontinue / Do not initiate                                             antibiotics Performed at [place] [address] [place], [place] [address]   Lactate dehydrogenase     Status: Abnormal   Collection Time: 02/24/19  4:30 PM  Result Value Ref Range   LDH 326 (H) 98 - 192 U/L    Comment: Performed at [facility], [place] [address] [place], [place] [address]  Ferritin     Status: Abnormal   Collection Time: 02/24/19  4:30 PM  Result Value Ref Range   Ferritin 361 (H) 11 - 307 ng/mL    Comment: Performed at [facility], [place] [address], [place], [place] [address]  Triglycerides     Status: None   Collection Time: 02/24/19  4:30 PM  Result Value Ref Range   Triglycerides 121 <150 mg/dL    Comment: Performed at [facility], [place] [address], [place], [place] [address]  Fibrinogen     Status: Abnormal   Collection Time: 02/24/19  4:30 PM  Result Value Ref Range   Fibrinogen >800 (H) 210 - 475 mg/dL    Comment: Performed at [facility], [place] [address] [place], [place] [address]  C-reactive protein     Status: Abnormal   Collection Time: 02/24/19  4:30 PM  Result Value Ref Range   CRP 10.7 (H) <1.0 mg/dL    Comment: Performed at [facility], [place] [address] [place], [place] [address]   Dg Chest Port 1 View  Result Date: 02/24/2019 CLINICAL DATA:  Shortness of breath.  Covid positive. EXAM: PORTABLE CHEST 1 VIEW COMPARISON:  11/27/2017 FINDINGS: Previous median sternotomy. Heart size is enlarged. Left mid lung and left base airspace opacities are new from the previous exam compatible with multifocal pneumonia. The right lung appears clear. No edema or effusion. IMPRESSION: 1. Multifocal airspace densities within the left lung compatible with infection. Electronically Signed   By: Kerby Moors M.D.   On: 02/24/2019 17:26    Pending Labs Unresulted Labs (From admission, onward)    Start     Ordered   03/03/19 0500  Creatinine, serum  (enoxaparin (LOVENOX)    CrCl >/= 30 ml/min)  Weekly,   R    Comments: while on enoxaparin therapy    02/24/19 2053   02/25/19 0500  CBC with Differential/Platelet  Daily,   R     02/24/19 2053   02/25/19 0500  Comprehensive metabolic panel  Daily,   R     02/24/19 2053   02/24/19 2042  CBC  (enoxaparin (LOVENOX)    CrCl >/= 30 ml/min)  Once,   STAT    Comments: Baseline for enoxaparin therapy IF NOT ALREADY DRAWN.  Notify MD if PLT < 100 K.  02/24/19 2053   02/24/19 2042  Creatinine, serum  (enoxaparin (LOVENOX)    CrCl >/= 30 ml/min)  Once,   STAT    Comments: Baseline for enoxaparin therapy IF NOT ALREADY DRAWN.    02/24/19 2053   02/24/19 1630  Blood Culture (routine x 2)  BLOOD CULTURE X 2,   STAT     02/24/19 1630           Vitals/Pain Today's Vitals   02/24/19 1539 02/24/19 1551 02/24/19 1724 02/24/19 1826  BP:  (!) 159/81 (!) 136/119 (!) 138/98  Pulse:   77 72  Resp:  18 18 18   Temp:  98.7 F (37.1 C)    TempSrc:  Oral    SpO2:  92% 91% 90%  PainSc: 0-No pain       Isolation Precautions Airborne and Contact precautions  Medications Medications  dexamethasone (DECADRON) injection 10 mg (has no administration in time range)  metoCLOPramide (REGLAN) injection 10 mg (has no administration in time range)  acetaminophen (TYLENOL) tablet 650 mg (has no administration in time range)  acetaminophen (TYLENOL) tablet 1,000 mg (has no administration in time range)  aspirin chewable tablet 81 mg (has no administration in time range)  doxycycline (VIBRA-TABS) tablet 100 mg (has no administration in time range)  hydroxychloroquine (PLAQUENIL) tablet 200 mg (has no administration in time range)  amLODipine (NORVASC) tablet 5 mg (has no administration in time range)  nebivolol (BYSTOLIC) tablet 10 mg (has no administration in time range)  buPROPion (WELLBUTRIN XL) 24 hr tablet 150 mg (has no administration in time range)  busPIRone (BUSPAR) tablet 7.5 mg (has no administration in time range)  DULoxetine (CYMBALTA) DR capsule 60 mg (has no administration in time range)  galantamine (RAZADYNE ER) 24 hr capsule 16 mg (has no administration in time range)  dexamethasone (DECADRON) tablet 6 mg (has no administration in time range)  pantoprazole (PROTONIX) EC tablet 40 mg (has no administration in time range)  folic acid (FOLVITE) tablet 2 mg (has no administration in time range)  baclofen (LIORESAL) tablet 10 mg (has no administration in time range)  gabapentin (NEURONTIN) capsule 600 mg (has no administration in time range)  budesonide (PULMICORT) nebulizer solution 0.5 mg (0.5 mg Nebulization Not Given 02/24/19 2108)  fluticasone (FLONASE) 50 MCG/ACT nasal spray 2 spray (has no administration in time range)   triamcinolone cream (KENALOG) 0.5 % (has no administration in time range)  nystatin (MYCOSTATIN) 100000 UNIT/ML suspension 500,000 Units (has no administration in time range)  irbesartan (AVAPRO) tablet 300 mg (has no administration in time range)    And  hydrochlorothiazide (HYDRODIURIL) tablet 25 mg (has no administration in time range)  enoxaparin (LOVENOX) injection 40 mg (has no administration in time range)  sodium chloride flush (NS) 0.9 % injection 3 mL (has no administration in time range)  sodium chloride flush (NS) 0.9 % injection 3 mL (has no administration in time range)  sodium chloride flush (NS) 0.9 % injection 3 mL (has no administration in time range)  0.9 %  sodium chloride infusion (has no administration in time range)  tocilizumab (ACTEMRA) 8 mg/kg in sodium chloride 0.9 % 100 mL infusion (has no administration in time range)  traMADol (ULTRAM) tablet 100 mg (has no administration in time range)  senna (SENOKOT) tablet 8.6 mg (has no administration in time range)  insulin aspart (novoLOG) injection 0-20 Units (has no administration in time range)  0.45 % sodium chloride infusion (has no administration in time range)  albuterol (VENTOLIN HFA) 108 (90 Base) MCG/ACT inhaler 6 puff (6 puffs Inhalation Given 02/24/19 1650)    Mobility walks

## 2019-02-24 NOTE — ED Triage Notes (Addendum)
EMS reports coming from home, SOB worsening today. Dx Covid + 5 days ago. Hx of Asthma Normally on 2lts O2 - 24-7  BP 130/78 HR 78 RR 18 Sp02 92 on Non-rebreather @ 15 lts CBG 261

## 2019-02-24 NOTE — ED Provider Notes (Signed)
[facility] DEPT Provider Note   CSN: [id number] Arrival date & time: 02/24/19  1531    History   Chief Complaint Chief Complaint  Patient presents with  . Shortness of Breath    HPI [name] is a 73 y.o. female.     HPI  73 year old female with history of asthma on 2 L of oxygen comes in with chief complaint of shortness of breath.  She also has history of CAD, diabetes.  Patient states that on Monday she started having some nasal congestion.  She followed up with her PCP and had a test for COVID-19 that day which came back positive on Wednesday.  Over the last couple of days she has had increased shortness of breath and decided to come to the ER.  According to EMS patient's O2 sats were in the low 80s when they arrived.  She is currently on 15 L/nonrebreather.  She is having shortness of breath with exertion and feeling of dizziness.  She has a mild cough.  Patient is also having subjective low-grade temps and weakness.  Pt has no hx of PE, DVT and denies any exogenous hormone (testosterone / estrogen) use, long distance travels or surgery in the past 6 weeks, active cancer, recent immobilization.  Past Medical History:  Diagnosis Date  . Actinic keratosis   . Asthma    Albuterol inhaler as needed.Pulmicort neb as needed  . Asthma   . Breast cancer ([place])    right - lumpectomy   . Chronic back pain    spinal stenosis  . Coronary artery disease   . Dependence on nocturnal oxygen therapy 07/10/2017   Pt uses 2 liters 02 at night   . Depression    takes CYmbalta and Wellbutrin daily  . Diabetes mellitus    not on any meds/controlled by diet  . Dyspnea    with exertion occasionally when lies down  . GERD (gastroesophageal reflux disease)    takes Omeprazole daily  . Headache    oocasionally  . History of kidney stones   . Hyperlipidemia    not on any meds  . Hypertension    takes Benazepril,Bystolic,and Amlodipine daily  . IBS  (irritable bowel syndrome)   . Joint pain   . Joint swelling   . Livedoid vasculitis   . Nocturia   . OA (osteoarthritis) of knee   . OSA (obstructive sleep apnea)   . Other seborrheic keratosis   . Oxygen deficiency    2 liters at night per pt for OSA- no cpap   . Peripheral edema    takes daily as needed  . Peripheral neuropathy   . Personal history of chemotherapy   . Personal history of radiation therapy   . Pneumonia    hx of-several yrs ago  . RA (rheumatoid arthritis) ([place])   . Sleep apnea    pt states she uses oxygen at night - no cpap- uses 02 2 liters at night   . Urinary frequency   . Urinary urgency   . Weakness    numbness and tingling mainly in left leg occasionally in right.Tingling/numbness in hands    Patient Active Problem List   Diagnosis Date Noted  . Diarrhea   . Benign neoplasm of cecum   . Abdominal pain, epigastric   . Gastroesophageal reflux disease   . Dependence on nocturnal oxygen therapy 07/10/2017  . Open wound of left lower leg 12/26/2016  . Vitamin D deficiency 09/14/2016  .  History of total knee replacement, bilateral 09/14/2016  . DJD (degenerative joint disease), cervical 09/14/2016  . 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 High risk medication use 05/20/2016  . Spinal stenosis, lumbar region, with neurogenic claudication 09/08/2015    Class: Chronic  . OSA (obstructive sleep apnea) 10/17/2013  . Dyspnea 09/04/2013  . Atypical chest pain 09/04/2013  . Morbid obesity ([place]) 03/21/2012  . Cancer of lower-outer quadrant of female breast ([place]) 03/15/2012  . Diabetes mellitus type II 02/22/2011  . Coronary artery disease   . Hypertension   . Hyperlipidemia   . OA (osteoarthritis) of knee     Past Surgical History:  Procedure Laterality Date  . BREAST LUMPECTOMY  1997   RIGHT BREAST  . CARDIAC CATHETERIZATION  04/23/2004   EF 60%  . cataract surgery Bilateral   . COLONOSCOPY    .  COLONOSCOPY WITH PROPOFOL N/A 08/07/2017   Procedure: COLONOSCOPY WITH PROPOFOL;  Surgeon: Ladene Artist, MD;  Location: [facility];  Service: Endoscopy;  Laterality: N/A;  . CORONARY ARTERY BYPASS GRAFT  2005   LIMA GRAFT TO LAD, SAPHENOUS VEIN GRAFT TO THE FIRST DIAGONAL, AND LEFT RADIAL ARTERY GRAFT TO THE OM  . ESOPHAGOGASTRODUODENOSCOPY (EGD) WITH PROPOFOL N/A 08/07/2017   Procedure: ESOPHAGOGASTRODUODENOSCOPY (EGD) WITH PROPOFOL;  Surgeon: Ladene Artist, MD;  Location: [facility];  Service: Endoscopy;  Laterality: N/A;  . FOOT SURGERY Right   . JOINT REPLACEMENT    . LITHOTRIPSY    . LUMBAR LAMINECTOMY/DECOMPRESSION MICRODISCECTOMY N/A 09/08/2015   Procedure: CENTRAL DECOMPRESSIVE LUMBAR LAMINECTOMIES L3-4, L4-5 AND BILATERAL HEMILAMINECTOMY L5-S1;  Surgeon: Jessy Oto, MD;  Location: [facility];  Service: Orthopedics;  Laterality: N/A;  . nodule removed from left elbow    . SHOULDER ARTHROSCOPY    . TOTAL KNEE ARTHROPLASTY Bilateral   . TOTAL SHOULDER ARTHROPLASTY    . TRIPLE BYPASS  04/27/05  . US ECHOCARDIOGRAPHY  01/06/2009   EF 55-60%  . WRIST SURGERY Left      OB History   No obstetric history on file.      Home Medications    Prior to Admission medications   Medication Sig Start Date End Date Taking? Authorizing Provider  acetaminophen (TYLENOL) 500 MG tablet Take 1,000 mg by mouth 2 (two) times daily as needed for moderate pain.   Yes [provider]  albuterol (PROVENTIL HFA;VENTOLIN HFA) 108 (90 BASE) MCG/ACT inhaler Inhale 2 puffs into the lungs every 4 (four) hours as needed for wheezing or shortness of breath (cough, shortness of breath or wheezing.). 07/22/13  Yes Roselee Culver, MD  amLODipine (NORVASC) 5 MG tablet Take 5 mg by mouth Daily.  02/24/12  Yes [provider]  aspirin 81 MG tablet Take 81 mg by mouth at bedtime.    Yes [provider]  baclofen (LIORESAL) 10 MG tablet TAKE 1 TABLET BY MOUTH TWICE A DAY AS NEEDED FOR  SPASMS Patient taking differently: Take 10 mg by mouth 2 (two) times daily.  03/21/17  Yes Jessy Oto, MD  betamethasone dipropionate (DIPROLENE) 0.05 % ointment Apply 1 application topically daily.  01/01/19  Yes [provider]  budesonide (PULMICORT) 0.5 MG/2ML nebulizer solution Take 0.5 mg by nebulization 2 (two) times a day.    Yes [provider]  buPROPion (WELLBUTRIN XL) 150 MG 24 hr tablet Take 150 mg by mouth daily.  08/28/16  Yes [provider]  busPIRone (BUSPAR) 7.5 MG tablet Take 7.5  mg by mouth 2 (two) times daily.  07/22/16  Yes [provider]  dexamethasone (DECADRON) 6 MG tablet Take 6 mg by mouth daily.   Yes [provider]  doxycycline (MONODOX) 100 MG capsule Take 100 mg by mouth 2 (two) times daily. 11/23/17  Yes [provider]  DULoxetine (CYMBALTA) 60 MG capsule Take 60 mg by mouth daily. 11/19/18  Yes [provider]  fluticasone (FLONASE) 50 MCG/ACT nasal spray Place 2 sprays into both nostrils 2 (two) times a day.   Yes [provider]  folic acid (FOLVITE) 1 MG tablet Take 2 tablets (2 mg total) by mouth daily. 04/25/18 07/19/19 Yes Deveshwar, Abel Presto, MD  gabapentin (NEURONTIN) 300 MG capsule Take 600 mg by mouth 3 (three) times daily.  08/26/13  Yes [provider]  galantamine (RAZADYNE ER) 16 MG 24 hr capsule Take 16 mg by mouth daily with breakfast.  11/16/17  Yes [provider]  hydroxychloroquine (PLAQUENIL) 200 MG tablet TAKE 1 TABLET BY MOUTH TWICE A DAY 01/01/19  Yes Deveshwar, Abel Presto, MD  meloxicam (MOBIC) 15 MG tablet Take 15 mg by mouth daily.     Yes [provider]  Multiple Vitamins-Minerals (ZINC PO) Take 1 tablet by mouth daily.   Yes [provider]  nebivolol (BYSTOLIC) 10 MG tablet Take 10 mg by mouth daily.     Yes [provider]  nystatin (MYCOSTATIN) 100000 UNIT/ML suspension Take 5 mLs by mouth 4 (four) times daily. For 10 days   Yes  [provider]  olmesartan-hydrochlorothiazide (BENICAR HCT) 40-25 MG tablet Take 1 tablet by mouth daily.  02/28/18  Yes [provider]  omeprazole (PRILOSEC) 20 MG capsule Take 20 mg by mouth daily.   Yes [provider]    Family History Family History  Problem Relation Age of Onset  . Heart disease Mother   . Cancer Father   . Cancer Brother   . Heart disease Brother     Social History Social History   Tobacco Use  . Smoking status: Never Smoker  . Smokeless tobacco: Never Used  Substance Use Topics  . Alcohol use: No    Alcohol/week: 0.0 standard drinks  . Drug use: Never     Allergies   Statins and Morphine and related   Review of Systems Review of Systems  Constitutional: Positive for activity change.  Respiratory: Positive for cough and shortness of breath.   Gastrointestinal: Negative for nausea and vomiting.  Allergic/Immunologic: Negative for immunocompromised state.  Hematological: Does not bruise/bleed easily.     Physical Exam Updated Vital Signs BP (!) 138/98   Pulse 72   Temp 98.7 F (37.1 C) (Oral)   Resp 18   SpO2 90%   Physical Exam Vitals signs and nursing note reviewed.  Constitutional:      Appearance: She is well-developed.  HENT:     Head: Normocephalic and atraumatic.  Neck:     Musculoskeletal: Normal range of motion and neck supple.  Cardiovascular:     Rate and Rhythm: Normal rate.  Pulmonary:     Effort: Tachypnea present.  Abdominal:     General: Bowel sounds are normal.  Skin:    General: Skin is warm and dry.  Neurological:     Mental Status: She is alert and oriented to person, place, and time.      ED Treatments / Results  Labs (all labs ordered are listed, but only abnormal results are displayed) Labs Reviewed  SARS  CORONAVIRUS 2 (HOSPITAL ORDER, [facility]) - Abnormal; Notable for the following components:      Result Value   SARS Coronavirus 2  POSITIVE (*)    All other components within normal limits  CBC WITH DIFFERENTIAL/PLATELET - Abnormal; Notable for the following components:   Hemoglobin 15.3 (*)    HCT 46.3 (*)    Abs Immature Granulocytes 0.15 (*)    All other components within normal limits  COMPREHENSIVE METABOLIC PANEL - Abnormal; Notable for the following components:   Chloride 97 (*)    Glucose, Bld 302 (*)    Anion gap 17 (*)    All other components within normal limits  LACTATE DEHYDROGENASE - Abnormal; Notable for the following components:   LDH 326 (*)    All other components within normal limits  FERRITIN - Abnormal; Notable for the following components:   Ferritin 361 (*)    All other components within normal limits  FIBRINOGEN - Abnormal; Notable for the following components:   Fibrinogen >800 (*)    All other components within normal limits  C-REACTIVE PROTEIN - Abnormal; Notable for the following components:   CRP 10.7 (*)    All other components within normal limits  CULTURE, BLOOD (ROUTINE X 2)  CULTURE, BLOOD (ROUTINE X 2)  LACTIC ACID, PLASMA  D-DIMER, QUANTITATIVE (NOT AT [facility])  PROCALCITONIN  TRIGLYCERIDES    EKG None  Radiology Dg Chest Port 1 View  Result Date: 02/24/2019 CLINICAL DATA:  Shortness of breath.  Covid positive. EXAM: PORTABLE CHEST 1 VIEW COMPARISON:  11/27/2017 FINDINGS: Previous median sternotomy. Heart size is enlarged. Left mid lung and left base airspace opacities are new from the previous exam compatible with multifocal pneumonia. The right lung appears clear. No edema or effusion. IMPRESSION: 1. Multifocal airspace densities within the left lung compatible with infection. Electronically Signed   By: Kerby Moors M.D.   On: 02/24/2019 17:26    Procedures .Critical Care Performed by: Varney Biles, MD Authorized by: Varney Biles, MD   Critical care provider statement:    Critical care time (minutes):  42   Critical care was necessary to treat or prevent  imminent or life-threatening deterioration of the following conditions:  Respiratory failure   Critical care was time spent personally by me on the following activities:  Discussions with consultants, evaluation of patient's response to treatment, examination of patient, ordering and performing treatments and interventions, ordering and review of laboratory studies, ordering and review of radiographic studies, pulse oximetry, re-evaluation of patient's condition, obtaining history from patient or surrogate and review of old charts   (including critical care time)  Medications Ordered in ED Medications  dexamethasone (DECADRON) injection 10 mg (has no administration in time range)  metoCLOPramide (REGLAN) injection 10 mg (has no administration in time range)  acetaminophen (TYLENOL) tablet 650 mg (has no administration in time range)  albuterol (VENTOLIN HFA) 108 (90 Base) MCG/ACT inhaler 6 puff (6 puffs Inhalation Given 02/24/19 1650)     Initial Impression / Assessment and Plan / ED Course  I have reviewed the triage vital signs and the nursing notes.  Pertinent labs & imaging results that were available during my care of the patient were reviewed by me and considered in my medical decision making (see chart for details).        73 year old comes in a chief complaint of shortness of breath. She has history of asthma, diabetes, CAD and is on 2 L of oxygen at  home.  Allegedly she was diagnosed with COVID-19 earlier in the week and advised to come to the ER if her breathing was getting worse.  EMS reported that her O2 sats were in the 80s when they arrived.  Patient has been having some dizziness and exertional shortness of breath the last couple of days.  On non-rebreather at 15 L her O2 sats are 93%.  She is tachypneic but not in any acute respiratory distress right now.  Lung exam shows coarse breath sounds.  No signs of DVT.  Immediate concern is that she has worsening of COVID-19  pneumonia.  Other possibilities include PE, pulmonary edema or pleural effusion. Dissipate that patient will need admission.  Final Clinical Impressions(s) / ED Diagnoses   Final diagnoses:  Acute respiratory failure with hypoxia ([place])  COVID-19 virus detected    ED Discharge Orders    None       Varney Biles, MD 02/24/19 2026

## 2019-02-24 NOTE — ED Notes (Signed)
[facility] dispatch notified for need of transport.  

## 2019-02-24 NOTE — ED Notes (Signed)
Date and time results received: 02/24/19 8:04 PM  (use smartphrase ".now" to insert current time)  Test: COVID-19 Critical Value: Positive  Name of Provider Notified: Dr.Nanaviti  Orders Received? Or Actions Taken?: none

## 2019-02-24 NOTE — ED Notes (Signed)
[facility] at bedside 

## 2019-02-25 ENCOUNTER — Inpatient Hospital Stay (HOSPITAL_COMMUNITY): Payer: Medicare HMO

## 2019-02-25 LAB — GLUCOSE, CAPILLARY
Glucose-Capillary: 289 mg/dL — ABNORMAL HIGH (ref 70–99)
Glucose-Capillary: 317 mg/dL — ABNORMAL HIGH (ref 70–99)
Glucose-Capillary: 219 mg/dL — ABNORMAL HIGH (ref 70–99)
Glucose-Capillary: 197 mg/dL — ABNORMAL HIGH (ref 70–99)
Glucose-Capillary: 174 mg/dL — ABNORMAL HIGH (ref 70–99)

## 2019-02-25 MED ORDER — INSULIN ASPART 100 UNIT/ML ~~LOC~~ SOLN
3.0000 [IU] | Freq: Three times a day (TID) | SUBCUTANEOUS | Status: DC
  Administered 2019-02-25 – 2019-02-28 (×10): 3 [IU] via SUBCUTANEOUS

## 2019-02-25 MED ORDER — INSULIN ASPART 100 UNIT/ML ~~LOC~~ SOLN
0.0000 [IU] | Freq: Three times a day (TID) | SUBCUTANEOUS | Status: DC
  Administered 2019-02-25: 11 [IU] via SUBCUTANEOUS
  Administered 2019-02-25: 7 [IU] via SUBCUTANEOUS
  Administered 2019-02-25: 4 [IU] via SUBCUTANEOUS
  Administered 2019-02-26: 15 [IU] via SUBCUTANEOUS
  Administered 2019-02-26: 12:00:00 7 [IU] via SUBCUTANEOUS
  Administered 2019-02-26: 3 [IU] via SUBCUTANEOUS
  Administered 2019-02-27: 4 [IU] via SUBCUTANEOUS
  Administered 2019-02-27 – 2019-03-01 (×7): 11 [IU] via SUBCUTANEOUS
  Administered 2019-03-02: 15 [IU] via SUBCUTANEOUS
  Administered 2019-03-02 – 2019-03-03 (×4): 4 [IU] via SUBCUTANEOUS
  Administered 2019-03-04: 7 [IU] via SUBCUTANEOUS
  Administered 2019-03-04 – 2019-03-05 (×2): 3 [IU] via SUBCUTANEOUS
  Administered 2019-03-05: 7 [IU] via SUBCUTANEOUS
  Administered 2019-03-05: 2 [IU] via SUBCUTANEOUS
  Administered 2019-03-06: 7 [IU] via SUBCUTANEOUS
  Administered 2019-03-06 – 2019-03-07 (×2): 4 [IU] via SUBCUTANEOUS

## 2019-02-25 MED ORDER — VITAMIN C 500 MG PO TABS
500.0000 mg | ORAL_TABLET | Freq: Every day | ORAL | Status: DC
  Administered 2019-02-25 – 2019-03-07 (×11): 500 mg via ORAL
  Filled 2019-02-25 (×11): qty 1

## 2019-02-25 MED ORDER — LABETALOL HCL 5 MG/ML IV SOLN: 10.0000 mg | Freq: Four times a day (QID) | INTRAVENOUS | Status: DC | PRN

## 2019-02-25 MED ORDER — INSULIN DETEMIR 100 UNIT/ML ~~LOC~~ SOLN
0.1000 [IU]/kg | Freq: Two times a day (BID) | SUBCUTANEOUS | Status: DC
  Administered 2019-02-25 – 2019-02-28 (×7): 10 [IU] via SUBCUTANEOUS
  Filled 2019-02-25 (×8): qty 0.1

## 2019-02-25 MED ORDER — ZINC SULFATE 220 (50 ZN) MG PO CAPS
220.0000 mg | ORAL_CAPSULE | Freq: Every day | ORAL | Status: DC
  Administered 2019-02-25 – 2019-03-07 (×11): 220 mg via ORAL
  Filled 2019-02-25 (×11): qty 1

## 2019-02-25 MED ORDER — ACETAMINOPHEN 325 MG PO TABS
650.0000 mg | ORAL_TABLET | Freq: Four times a day (QID) | ORAL | Status: DC | PRN
  Administered 2019-02-25 – 2019-03-06 (×13): 650 mg via ORAL
  Filled 2019-02-25 (×13): qty 2

## 2019-02-25 MED ORDER — INSULIN ASPART 100 UNIT/ML ~~LOC~~ SOLN
0.0000 [IU] | Freq: Every day | SUBCUTANEOUS | Status: DC
  Administered 2019-02-26: 2 [IU] via SUBCUTANEOUS
  Administered 2019-02-27 – 2019-02-28 (×2): 3 [IU] via SUBCUTANEOUS
  Administered 2019-03-01: 4 [IU] via SUBCUTANEOUS
  Administered 2019-03-02: 3 [IU] via SUBCUTANEOUS
  Administered 2019-03-03: 2 [IU] via SUBCUTANEOUS

## 2019-02-25 NOTE — Progress Notes (Signed)
   02/25/19 0053  Vitals  BP (!) 153/73  BP Location Left Arm  Pulse Rate 71  Pulse Rate Source Monitor  Cardiac Rhythm NSR   Post PRN labetalol 10mg  IVP

## 2019-02-25 NOTE — Progress Notes (Signed)
Rt called to see pt d/t desat.  Rt arrived to find pt on 15LPM [place] HFNC, with sat at 89%.  RN said as/while pt goes to bedside commode she begins to desat and will drop as low as 70's.  RT suggested using bedpan until pt is able to tolerate that much mobility.  Pt is O2 dependant at home and wears 2lpm [place].  Pt was in no distress when RT arrived.  HR-60s, RR-20s, no obvious WOB.  RT supplied room with splitter for use of NRB and [place] if necessary.  RT explaine dto pt that the movement to the bedpan may be more than she can handle until she is feeling better.  Pt did state she felt "fine."  Rt will continue to monitor.

## 2019-02-25 NOTE — Progress Notes (Addendum)
PROGRESS NOTE    [name]  IRJ:[id number] DOB: 1946/01/31 DOA: 02/24/2019 PCP: Crist Infante, MD      Brief Narrative:  [name] is a 73 y.o. F with asthma on 2 L O2, CAD, DM who presented with 7 days chest congestion, cough, now with progressive shortness of breath.  In the ER she had positive SARS-CoV-2, required 15 L high flow [place], and chest x-ray showed bilateral pneumonia.  She was given remdesivir, Actemra, and dexamethasone and transferred to [facility].       Assessment & Plan:  Coronavirus pneumonitis with acute hypoxic respiratory failure In setting of ongoing 2020 COVID-19 pandemic.  S/p Actemra 8/2  O2 flow rate at 15L.  RR >20. -Continue remdesivir day 2 of 5 -Continue steroids day 2  -VTE PPx with Lovneox -Continue Zinc and Vitamin C -Daily d-dimer, ferritin and CRP  COVID-19 Labs Recent Labs    02/24/19 1630  DDIMER 0.46  FERRITIN 361*  LDH 326*  CRP 10.7*    Diabetes with hyperglycemia Poorly controlled -Continue gabapentin -Continue sliding scale corrections -Add Levemir and scheduled mealtime insulin -Consult diabetes educator  Coronary disease Hypertension Uncontrolled -Continue aspirin -Continue amlodipine, irbesartan, HCTZ, Bystolic -Labetalol as needed for severe range pressures  Rheumatoid arthritis No active disease -Continue Plaquenil  Asthma No active wheezing. -Albuterol PRN -Continue budesonide  Other medications -Continue duloxetine, BuSpar, Wellbutrin -Continue galantamine -Continue pantoprazole -Continue baclofen  Cellulitis Longstanding ulcers on legs, recently treated with doxycycline and then cephalexin (converted to doxy to cover a sinus infection) -Observe off antibiotics for now        MDM and disposition: The below labs and imaging reports were reviewed and summarized above.  Medication management as above.  The patient was admitted with severe COVID-19.   This is an acute idsease  with severe threat to life or bodily funciton.       DVT prophylaxis: Lovenox Code Status: FULL Family Communication: Husband by phone    Antimicrobials:   Doxycycline x1       Subjective: Feeling tired, mild dyspnea.  Frequent cough, no sputum or hemoptysis, no chest pain, no confusion, no vomiting or diarreha.  Objective: Vitals:   02/24/19 2335 02/25/19 0053 02/25/19 0825 02/25/19 1246  BP: (!) 190/86 (!) 153/73 (!) 141/74 140/77  Pulse: 72 71 74 63  Resp: (!) 28     Temp: 98.6 F (37 C)  98 F (36.7 C) 98.1 F (36.7 C)  TempSrc: Oral  Oral Oral  SpO2:   (!) 88% 90%  Weight: 102.8 kg     Height: 5\' 4"  (1.626 m)       Intake/Output Summary (Last 24 hours) at 02/25/2019 1450 Last data filed at 02/25/2019 0533 Gross per 24 hour  Intake 494.89 ml  Output 200 ml  Net 294.89 ml   Filed Weights   02/24/19 2322 02/24/19 2335  Weight: 110 kg 102.8 kg    Examination: General appearance: Obese adult female, alert and in mild respiratory distress.   HEENT: Anicteric, conjunctiva pink, lids and lashes normal. No nasal deformity, discharge, epistaxis.  Lips moist, dentition normal, OP tacky dry, no oral lesions.   Skin: Warm and dry.  No jaundice.  No suspicious rashes or lesions. Cardiac: RRR, nl S1-S2, no murmurs appreciated.  Capillary refill is brisk.  JVP not visible.  No LE edema.  Radial pulses 2+ and symmetric. Respiratory: Normal respiratory rate and rhythm.  CTAB without rales or wheezes. Abdomen: Abdomen soft.  No TTP. No ascites, distension, hepatosplenomegaly.   MSK: No deformities or effusions. Neuro: Awake and alert.  EOMI, moves all extremities. Speech fluent.    Psych: Sensorium intact and responding to questions, attention normal. Affect normal.  Judgment and insight appear normal.      Data Reviewed: I have personally reviewed following labs and imaging studies:  CBC: Recent Labs  Lab 02/24/19 1630  WBC 8.9  NEUTROABS 7.4  HGB 15.3*  HCT  46.3*  MCV 91.1  PLT 814   Basic Metabolic Panel: Recent Labs  Lab 02/24/19 1630  NA 136  K 3.9  CL 97*  CO2 22  GLUCOSE 302*  BUN 18  CREATININE 0.55  CALCIUM 9.0   GFR: Estimated Creatinine Clearance: 72 mL/min (by C-G formula based on SCr of 0.55 mg/dL). Liver Function Tests: Recent Labs  Lab 02/24/19 1630  AST 30  ALT 24  ALKPHOS 54  BILITOT 0.5  PROT 8.0  ALBUMIN 3.7   No results for input(s): LIPASE, AMYLASE in the last 168 hours. No results for input(s): AMMONIA in the last 168 hours. Coagulation Profile: No results for input(s): INR, PROTIME in the last 168 hours. Cardiac Enzymes: No results for input(s): CKTOTAL, CKMB, CKMBINDEX, TROPONINI in the last 168 hours. BNP (last 3 results) No results for input(s): PROBNP in the last 8760 hours. HbA1C: No results for input(s): HGBA1C in the last 72 hours. CBG: Recent Labs  Lab 02/24/19 2329 02/25/19 0407 02/25/19 0822 02/25/19 1246  GLUCAP 289* 317* 219* 197*   Lipid Profile: Recent Labs    02/24/19 1630  TRIG 121   Thyroid Function Tests: No results for input(s): TSH, T4TOTAL, FREET4, T3FREE, THYROIDAB in the last 72 hours. Anemia Panel: Recent Labs    02/24/19 1630  FERRITIN 361*   Urine analysis:    Component Value Date/Time   COLORURINE YELLOW 04/08/2011 0950   APPEARANCEUR CLOUDY (A) 04/08/2011 0950   LABSPEC 1.034 (H) 04/08/2011 0950   PHURINE 6.0 04/08/2011 0950   GLUCOSEU NEGATIVE 04/08/2011 0950   HGBUR NEGATIVE 04/08/2011 0950   BILIRUBINUR NEGATIVE 04/08/2011 0950   KETONESUR NEGATIVE 04/08/2011 0950   PROTEINUR NEGATIVE 04/08/2011 0950   UROBILINOGEN 0.2 04/08/2011 0950   NITRITE NEGATIVE 04/08/2011 0950   LEUKOCYTESUR NEGATIVE 04/08/2011 0950   Sepsis Labs: @LABRCNTIP (procalcitonin:4,lacticacidven:4)  ) Recent Results (from the past 240 hour(s))  SARS Coronavirus 2 [facility] order, Performed in [facility] hospital lab) Nasopharyngeal Nasopharyngeal Swab     Status:  Abnormal   Collection Time: 02/24/19  4:30 PM   Specimen: Nasopharyngeal Swab  Result Value Ref Range Status   SARS Coronavirus 2 POSITIVE (A) NEGATIVE Final    Comment: RESULT CALLED TO, READ BACK BY AND VERIFIED WITH: HODGES,C RN @2003  ON 02/24/2019 JACKSON,K (NOTE) If result is NEGATIVE SARS-CoV-2 target nucleic acids are NOT DETECTED. The SARS-CoV-2 RNA is generally detectable in upper and lower  respiratory specimens during the acute phase of infection. The lowest  concentration of SARS-CoV-2 viral copies this assay can detect is 250  copies / mL. A negative result does not preclude SARS-CoV-2 infection  and should not be used as the sole basis for treatment or other  patient management decisions.  A negative result may occur with  improper specimen collection / handling, submission of specimen other  than nasopharyngeal swab, presence of viral mutation(s) within the  areas targeted by this assay, and inadequate number of viral copies  (<250 copies / mL). A negative result must be combined with clinical  observations, patient history, and epidemiological information. If result is POSITIVE SARS-CoV-2 target nucleic acids are DETECTED . The SARS-CoV-2 RNA is generally detectable in upper and lower  respiratory specimens during the acute phase of infection.  Positive  results are indicative of active infection with SARS-CoV-2.  Clinical  correlation with patient history and other diagnostic information is  necessary to determine patient infection status.  Positive results do  not rule out bacterial infection or co-infection with other viruses. If result is PRESUMPTIVE POSTIVE SARS-CoV-2 nucleic acids MAY BE PRESENT.   A presumptive positive result was obtained on the submitted specimen  and confirmed on repeat testing.  While 2019 novel coronavirus  (SARS-CoV-2) nucleic acids may be present in the submitted sample  additional confirmatory testing may be necessary for  epidemiological  and / or clinical management purposes  to differentiate between  SARS-CoV-2 and other Sarbecovirus currently known to infect humans.  If clinically indicated additional testing with an alternate test  methodology [phone] ) is advised. The SARS-CoV-2 RNA is generally  detectable in upper and lower respiratory specimens during the acute  phase of infection. The expected result is Negative. Fact Sheet for Patients:  StrictlyIdeas.no Fact Sheet for Healthcare Providers: BankingDealers.co.za This test is not yet approved or cleared by the [place] FDA and has been authorized for detection and/or diagnosis of SARS-CoV-2 by FDA under an Emergency Use Authorization (EUA).  This EUA will remain in effect (meaning this test can be used) for the duration of the COVID-19 declaration under Section 564(b)(1) of the Act, 21 U.S.C. section 360bbb-3(b)(1), unless the authorization is terminated or revoked sooner. Performed at [facility], [place] [address] [place], [place] [address]          Radiology Studies: Portable Chest 1 View  Result Date: 02/25/2019 CLINICAL DATA:  Shortness of breath. EXAM: PORTABLE CHEST 1 VIEW COMPARISON:  Yesterday FINDINGS: Stable bilateral pulmonary infiltrate, greater on the left. Stable cardiomegaly. There has been CABG. No evident edema, effusion, or pneumothorax. IMPRESSION: Stable bilateral pneumonia. Electronically Signed   By: Monte Fantasia M.D.   On: 02/25/2019 04:40   Dg Chest Port 1 View  Result Date: 02/24/2019 CLINICAL DATA:  Shortness of breath.  Covid positive. EXAM: PORTABLE CHEST 1 VIEW COMPARISON:  11/27/2017 FINDINGS: Previous median sternotomy. Heart size is enlarged. Left mid lung and left base airspace opacities are new from the previous exam compatible with multifocal pneumonia. The right lung appears clear. No edema or effusion. IMPRESSION: 1. Multifocal  airspace densities within the left lung compatible with infection. Electronically Signed   By: Kerby Moors M.D.   On: 02/24/2019 17:26        Scheduled Meds: . amLODipine  5 mg Oral Daily  . aspirin  81 mg Oral QHS  . baclofen  10 mg Oral BID  . budesonide  1 puff Inhalation BID  . buPROPion  150 mg Oral Daily  . busPIRone  7.5 mg Oral BID  . dexamethasone  6 mg Oral Daily  . DULoxetine  60 mg Oral Daily  . enoxaparin (LOVENOX) injection  40 mg Subcutaneous Q24H  . fluticasone  2 spray Each Nare BID  . folic acid  2 mg Oral Daily  . gabapentin  600 mg Oral TID  . galantamine  16 mg Oral Q breakfast  . irbesartan  300 mg Oral Daily   And  . hydrochlorothiazide  25 mg Oral Daily  . hydroxychloroquine  200 mg Oral BID  .  insulin aspart  0-20 Units Subcutaneous TID WC  . insulin aspart  0-5 Units Subcutaneous QHS  . insulin aspart  3 Units Subcutaneous TID WC  . insulin detemir  0.1 Units/kg Subcutaneous BID  . nebivolol  10 mg Oral Daily  . nystatin  5 mL Oral QID  . pantoprazole  40 mg Oral Daily  . senna  1 tablet Oral BID  . sodium chloride flush  3 mL Intravenous Q12H  . triamcinolone cream   Topical BID  . vitamin C  500 mg Oral Daily  . zinc sulfate  220 mg Oral Daily   Continuous Infusions: . remdesivir 100 mg in NS 250 mL       LOS: 1 day    Time spent: 35 minutes      Edwin Dada, MD [facility] 02/25/2019, 2:50 PM     Please page through [place]:  [email or web address] Password [email or web address] If 7PM-7AM, please contact night-coverage

## 2019-02-25 NOTE — Progress Notes (Signed)
Oxygen sats drop to 83- 86% on 15L  HFNC during medication administration  Pt remains at 83-85 lying in bed high fowlers position. NRB  At 7L placed over HFNC pt sats increased to 90%.

## 2019-02-25 NOTE — Progress Notes (Signed)
MEDICATION RELATED CONSULT NOTE - INITIAL   Pharmacy Consult for Remdesivir Indication: COVID-19  Allergies  Allergen Reactions  . Statins Other (See Comments)    Joint pain and swelling/burning  . Morphine And Related Other (See Comments)    hallucinations   Assessment: 73 yo F presents with aggressive SOB. Found to have COVID-19. CXR positive and requiring supplemental oxygen. ALT wnl  Plan:  Give remdesivir 200mg  IV x 1, then start remdesivir 100mg  IV x 4 days Monitor clinical progress and ALT  Jackquelyn Sundberg J 02/25/2019,12:04 AM

## 2019-02-25 NOTE — Progress Notes (Signed)
Patient arrived to floor, MD notified of arrival. Laretta Bolster (Daughter) notified of arrival via home phone # [phone].

## 2019-02-25 NOTE — Progress Notes (Signed)
   02/24/19 2335  Vitals  Temp 98.6 F (37 C)  Temp Source Oral  BP (!) 190/86  MAP (mmHg) 117  BP Location Left Arm  BP Method Automatic  Patient Position (if appropriate) Lying  Pulse Rate 72  Resp (!) 28   PRN labetalol 10mg  IVP adminstered.

## 2019-02-25 NOTE — Progress Notes (Signed)
Oxygen sats 92% NRB removed. Will continue to monitor.

## 2019-02-26 DIAGNOSIS — G4733 Obstructive sleep apnea (adult) (pediatric): Secondary | ICD-10-CM

## 2019-02-26 DIAGNOSIS — U071 COVID-19: Principal | ICD-10-CM

## 2019-02-26 DIAGNOSIS — J1289 Other viral pneumonia: Secondary | ICD-10-CM

## 2019-02-26 DIAGNOSIS — E119 Type 2 diabetes mellitus without complications: Secondary | ICD-10-CM

## 2019-02-26 LAB — COMPREHENSIVE METABOLIC PANEL
ALT: 22 U/L (ref 0–44)
AST: 27 U/L (ref 15–41)
Albumin: 3.4 g/dL — ABNORMAL LOW (ref 3.5–5.0)
Alkaline Phosphatase: 51 U/L (ref 38–126)
Anion gap: 13 (ref 5–15)
BUN: 20 mg/dL (ref 8–23)
CO2: 24 mmol/L (ref 22–32)
Calcium: 9.2 mg/dL (ref 8.9–10.3)
Chloride: 99 mmol/L (ref 98–111)
Creatinine, Ser: 0.52 mg/dL (ref 0.44–1.00)
GFR calc Af Amer: >60 mL/min (ref >60)
GFR calc non Af Amer: >60 mL/min (ref >60)
Glucose, Bld: 184 mg/dL — ABNORMAL HIGH (ref 70–99)
Potassium: 3.6 mmol/L (ref 3.5–5.1)
Sodium: 136 mmol/L (ref 135–145)
Total Bilirubin: 0.5 mg/dL (ref 0.3–1.2)
Total Protein: 7.7 g/dL (ref 6.5–8.1)

## 2019-02-26 LAB — CBC WITH DIFFERENTIAL/PLATELET
Abs Immature Granulocytes: 0.16 10*3/uL — ABNORMAL HIGH (ref 0.00–0.07)
Basophils Absolute: 0 10*3/uL (ref 0.0–0.1)
Basophils Relative: 0 %
Eosinophils Absolute: 0 10*3/uL (ref 0.0–0.5)
Eosinophils Relative: 0 %
HCT: 44.7 % (ref 36.0–46.0)
Hemoglobin: 14.6 g/dL (ref 12.0–15.0)
Immature Granulocytes: 2 %
Lymphocytes Relative: 16 %
Lymphs Abs: 1.6 10*3/uL (ref 0.7–4.0)
MCH: 30 pg (ref 26.0–34.0)
MCHC: 32.7 g/dL (ref 30.0–36.0)
MCV: 91.8 fL (ref 80.0–100.0)
Monocytes Absolute: 0.7 10*3/uL (ref 0.1–1.0)
Monocytes Relative: 7 %
Neutro Abs: 7.5 10*3/uL (ref 1.7–7.7)
Neutrophils Relative %: 75 %
Platelets: 286 10*3/uL (ref 150–400)
RBC: 4.87 MIL/uL (ref 3.87–5.11)
RDW: 12.7 % (ref 11.5–15.5)
WBC: 10 10*3/uL (ref 4.0–10.5)
nRBC: 0 % (ref 0.0–0.2)

## 2019-02-26 LAB — GLUCOSE, CAPILLARY
Glucose-Capillary: 257 mg/dL — ABNORMAL HIGH (ref 70–99)
Glucose-Capillary: 140 mg/dL — ABNORMAL HIGH (ref 70–99)
Glucose-Capillary: 203 mg/dL — ABNORMAL HIGH (ref 70–99)
Glucose-Capillary: 310 mg/dL — ABNORMAL HIGH (ref 70–99)
Glucose-Capillary: 201 mg/dL — ABNORMAL HIGH (ref 70–99)

## 2019-02-26 LAB — HEMOGLOBIN A1C
Hgb A1c MFr Bld: 8.1 % — ABNORMAL HIGH (ref 4.8–5.6)
Mean Plasma Glucose: 185.77 mg/dL

## 2019-02-26 MED ORDER — MAGNESIUM SULFATE 2 GM/50ML IV SOLN
2.0000 g | Freq: Once | INTRAVENOUS | Status: AC
  Administered 2019-02-26: 23:00:00 2 g via INTRAVENOUS
  Filled 2019-02-26: qty 50

## 2019-02-26 MED ORDER — POTASSIUM CHLORIDE CRYS ER 20 MEQ PO TBCR
40.0000 meq | EXTENDED_RELEASE_TABLET | Freq: Once | ORAL | Status: AC
  Administered 2019-02-26: 22:00:00 40 meq via ORAL
  Filled 2019-02-26: qty 2

## 2019-02-26 MED ORDER — ENOXAPARIN SODIUM 40 MG/0.4ML ~~LOC~~ SOLN
40.0000 mg | Freq: Two times a day (BID) | SUBCUTANEOUS | Status: DC
  Administered 2019-02-26 – 2019-02-27 (×2): 40 mg via SUBCUTANEOUS
  Filled 2019-02-26 (×2): qty 0.4

## 2019-02-26 MED ORDER — LORAZEPAM 2 MG/ML IJ SOLN
0.5000 mg | INTRAMUSCULAR | Status: DC | PRN
  Administered 2019-02-26: 0.5 mg via INTRAVENOUS
  Filled 2019-02-26: qty 1

## 2019-02-26 MED ORDER — PROCHLORPERAZINE EDISYLATE 10 MG/2ML IJ SOLN
5.0000 mg | INTRAMUSCULAR | Status: DC | PRN
  Filled 2019-02-26: qty 1

## 2019-02-26 MED ORDER — POTASSIUM CHLORIDE CRYS ER 20 MEQ PO TBCR
20.0000 meq | EXTENDED_RELEASE_TABLET | Freq: Every day | ORAL | Status: DC
  Administered 2019-02-27 – 2019-03-07 (×9): 20 meq via ORAL
  Filled 2019-02-26 (×9): qty 1

## 2019-02-26 MED ORDER — ENOXAPARIN SODIUM 40 MG/0.4ML ~~LOC~~ SOLN: 40.0000 mg | SUBCUTANEOUS | Status: DC

## 2019-02-26 MED ORDER — ALBUTEROL SULFATE HFA 108 (90 BASE) MCG/ACT IN AERS
2.0000 | INHALATION_SPRAY | Freq: Four times a day (QID) | RESPIRATORY_TRACT | Status: DC | PRN
  Filled 2019-02-26: qty 6.7

## 2019-02-26 NOTE — Progress Notes (Signed)
Night shift hospitalist ICU coverage note.  QT prolongation has been seen on the monitor since earlier today.  Plaquenil night dose was held.  Magnesium and potassium supplementation was given.  Avoid non-essential QT prolonging medications.  Repeat EKG in AM before morning meds are given.  Tennis Must, MD

## 2019-02-26 NOTE — Progress Notes (Signed)
RT called to see pt by physician to assess whether pt needs heated high flow nasal cannula or not. Upon entrance to pt room, pt on 15L [place] HFNC. No distress noted, Spo2 92%. RN at bedside. Pt with clear/diminished bilateral breath sounds. Spliter remains in room for use of NRB and [place] HFNC. Pt did state that she is anxious and gets SOB easily. Encouraged her to take breaks when eating, working on IS and talking on phone. Pt verbalizes understanding. RT encouraged use of bedpan again to limit movement. RT will continue to monitor.

## 2019-02-26 NOTE — Progress Notes (Signed)
Due to increasing oxygen needs, pt is being transferred to ICU for heated high flow oxygen. Report called to nurse Melissa. Dtr Tammy called and notified of transfer. I called her this morning with updates. Tammy has updated her sister Lattie Haw, denies any questions or concerns at this time and verbalizes understanding.

## 2019-02-26 NOTE — Consult Note (Signed)
NAME:  [name], MRN:  [id number], DOB:  1946/05/30, LOS: 2 ADMISSION DATE:  02/24/2019, CONSULTATION DATE:  8/4 REFERRING MD:  Dan[place], CHIEF COMPLAINT:  dyspnea   Brief History   73 yo female with multiple medical problems admitted on 8/2 in the setting of severe acute respioratory failure with hypoxemia due to COVID 19 pneumonia.  History of present illness   74 y/o female with multiple medical problems presented to the ER On 8/2 with 7 days of cough, dyspnea and chest congestion.  She was noted to be positive for SARS-COV-2 and started on 15 L high flow nasal cannula.  She had a CXR performed which showed pneumonia.   She was admitted to [facility] where she was treated with decadron, remdesivir, and actemra.  She was also started on hydroxychloroquine.  Her oxygen needs worsened and she became more short of breath so she required transfer to the ICU at [facility] on 8/4.  She says that she thinks she acquired the infection from her son-in-law.  She was started on heated high flow in the intensive care unit and is now feeling better.  Past Medical History  Hypertension Asthma History of breast cancer Coronary artery disease Nocturnal oxygen, 2 L at night Diabetes mellitus Gastroesophageal reflux disease Hypertension Hyperlipidemia Obstructive sleep apnea  Significant Hospital Events   August 2 admission August 4 transfer to intensive care unit for heated high flow  Consults:  Pulmonary and critical care medicine Procedures:    Significant Diagnostic Tests:    Micro Data:  8/2 SARS COV 2 > positive   Antimicrobials:  August 2 doxycycline August 2 hydroxychloroquine August 2 remdesivir August 2 Actemra August 2 decadron  Interim history/subjective:    Objective   Blood pressure 108/67, pulse 65, temperature 97.9 F (36.6 C), temperature source Oral, resp. rate (!) 25, height 5\' 4"  (1.626 m), weight 102.8 kg, SpO2 90 %.        Intake/Output Summary (Last 24 hours) at  02/26/2019 1812 Last data filed at 02/26/2019 1400 Gross per 24 hour  Intake 480 ml  Output 550 ml  Net -70 ml   Filed Weights   02/24/19 2322 02/24/19 2335  Weight: 110 kg 102.8 kg    Examination:  General:  Resting comfortably in bed HENT: NCAT OP clear PULM: CTA B, normal effort CV: RRR, no mgr GI: BS+, soft, nontender MSK: normal bulk and tone Neuro: awake, alert, no distress, MAEW  August 3 chest x-ray images independently reviewed showing bibasilar opacities left greater than right, cardiomegaly, sternal wires  Resolved Hospital Problem list     Assessment & Plan:  Severe acute respiratory failure with hypoxemia secondary to COVID-19 pneumonia. At risk for ventilatory failure but currently showing no signs of that Moved to intensive care unit for close monitoring Continue remdesivir Continue decadron Discuss hydroxychloroquine with primary team (home medicine?) Transition to heated high flow oxygent Tolerate periods of hypoxemia, goal at rest is greater than 85% SaO2, with movement ideally above 75% Decision for intubation should be based on a change in mental status or physical evidence of ventilatory failure such as nasal flaring, accessory muscle use, paradoxical breathing Out of bed to chair as able Incentive spirometry is important, use every hour Prone positioning while in bed  Asthma Albuterol prn Monitor for wheezing   Best practice:   Per [facility]  Labs   CBC: Recent Labs  Lab 02/24/19 1630 02/26/19 0250  WBC 8.9 10.0  NEUTROABS 7.4 7.5  HGB 15.3*  14.6  HCT 46.3* 44.7  MCV 91.1 91.8  PLT 199 127    Basic Metabolic Panel: Recent Labs  Lab 02/24/19 1630 02/26/19 0250  NA 136 136  K 3.9 3.6  CL 97* 99  CO2 22 24  GLUCOSE 302* 184*  BUN 18 20  CREATININE 0.55 0.52  CALCIUM 9.0 9.2   GFR: Estimated Creatinine Clearance: 72 mL/min (by C-G formula based on SCr of 0.52 mg/dL). Recent Labs  Lab 02/24/19 1630 02/26/19 0250  PROCALCITON  <0.10  --   WBC 8.9 10.0  LATICACIDVEN 1.7  --     Liver Function Tests: Recent Labs  Lab 02/24/19 1630 02/26/19 0250  AST 30 27  ALT 24 22  ALKPHOS 54 51  BILITOT 0.5 0.5  PROT 8.0 7.7  ALBUMIN 3.7 3.4*   No results for input(s): LIPASE, AMYLASE in the last 168 hours. No results for input(s): AMMONIA in the last 168 hours.  ABG No results found for: PHART, PCO2ART, PO2ART, HCO3, TCO2, ACIDBASEDEF, O2SAT   Coagulation Profile: No results for input(s): INR, PROTIME in the last 168 hours.  Cardiac Enzymes: No results for input(s): CKTOTAL, CKMB, CKMBINDEX, TROPONINI in the last 168 hours.  HbA1C: Hgb A1c MFr Bld  Date/Time Value Ref Range Status  02/26/2019 02:50 AM 8.1 (H) 4.8 - 5.6 % Final    Comment:    (NOTE) Pre diabetes:          5.7%-6.4% Diabetes:              >6.4% Glycemic control for   <7.0% adults with diabetes   09/03/2015 11:08 AM 6.2 (H) 4.8 - 5.6 % Final    Comment:    (NOTE)         Pre-diabetes: 5.7 - 6.4         Diabetes: >6.4         Glycemic control for adults with diabetes: <7.0     CBG: Recent Labs  Lab 02/25/19 1650 02/25/19 2106 02/26/19 0754 02/26/19 1154 02/26/19 1515  GLUCAP 257* 174* 140* 203* 310*    Review of Systems:   Gen: Denies fever, chills, weight change, fatigue, night sweats HEENT: Denies blurred vision, double vision, hearing loss, tinnitus, sinus congestion, rhinorrhea, sore throat, neck stiffness, dysphagia PULM: per HPI CV: Denies chest pain, edema, orthopnea, paroxysmal nocturnal dyspnea, palpitations GI: Denies abdominal pain, nausea, vomiting, diarrhea, hematochezia, melena, constipation, change in bowel habits GU: Denies dysuria, hematuria, polyuria, oliguria, urethral discharge Endocrine: Denies hot or cold intolerance, polyuria, polyphagia or appetite change Derm: Denies rash, dry skin, scaling or peeling skin change Heme: Denies easy bruising, bleeding, bleeding gums Neuro: Denies headache, numbness,  weakness, slurred speech, loss of memory or consciousness   Past Medical History  She,  has a past medical history of Actinic keratosis, Asthma, Asthma, Breast cancer ([place]), Chronic back pain, Coronary artery disease, Dependence on nocturnal oxygen therapy (07/10/2017), Depression, Diabetes mellitus, Dyspnea, GERD (gastroesophageal reflux disease), Headache, History of kidney stones, Hyperlipidemia, Hypertension, IBS (irritable bowel syndrome), Joint pain, Joint swelling, Livedoid vasculitis, Nocturia, OA (osteoarthritis) of knee, OSA (obstructive sleep apnea), Other seborrheic keratosis, Oxygen deficiency, Peripheral edema, Peripheral neuropathy, Personal history of chemotherapy, Personal history of radiation therapy, Pneumonia, RA (rheumatoid arthritis) ([place]), Sleep apnea, Urinary frequency, Urinary urgency, and Weakness.   Surgical History    Past Surgical History:  Procedure Laterality Date   BREAST LUMPECTOMY  1997   RIGHT BREAST   CARDIAC CATHETERIZATION  04/23/2004   EF 60%  cataract surgery Bilateral    COLONOSCOPY     COLONOSCOPY WITH PROPOFOL N/A 08/07/2017   Procedure: COLONOSCOPY WITH PROPOFOL;  Surgeon: Ladene Artist, MD;  Location: [facility];  Service: Endoscopy;  Laterality: N/A;   CORONARY ARTERY BYPASS GRAFT  2005   LIMA GRAFT TO LAD, SAPHENOUS VEIN GRAFT TO THE FIRST DIAGONAL, AND LEFT RADIAL ARTERY GRAFT TO THE OM   ESOPHAGOGASTRODUODENOSCOPY (EGD) WITH PROPOFOL N/A 08/07/2017   Procedure: ESOPHAGOGASTRODUODENOSCOPY (EGD) WITH PROPOFOL;  Surgeon: Ladene Artist, MD;  Location: [facility];  Service: Endoscopy;  Laterality: N/A;   FOOT SURGERY Right    JOINT REPLACEMENT     LITHOTRIPSY     LUMBAR LAMINECTOMY/DECOMPRESSION MICRODISCECTOMY N/A 09/08/2015   Procedure: CENTRAL DECOMPRESSIVE LUMBAR LAMINECTOMIES L3-4, L4-5 AND BILATERAL HEMILAMINECTOMY L5-S1;  Surgeon: Jessy Oto, MD;  Location: [facility];  Service: Orthopedics;  Laterality: N/A;   nodule  removed from left elbow     SHOULDER ARTHROSCOPY     TOTAL KNEE ARTHROPLASTY Bilateral    TOTAL SHOULDER ARTHROPLASTY     TRIPLE BYPASS  04/27/05   US ECHOCARDIOGRAPHY  01/06/2009   EF 55-60%   WRIST SURGERY Left      Social History   reports that she has never smoked. She has never used smokeless tobacco. She reports that she does not drink alcohol or use drugs.   Family History   Her family history includes Cancer in her brother and father; Heart disease in her brother and mother.   Allergies Allergies  Allergen Reactions   Statins Other (See Comments)    Joint pain and swelling/burning   Morphine And Related Other (See Comments)    hallucinations     Home Medications  Prior to Admission medications   Medication Sig Start Date End Date Taking? Authorizing Provider  acetaminophen (TYLENOL) 500 MG tablet Take 1,000 mg by mouth 2 (two) times daily as needed for moderate pain.   Yes [provider]  albuterol (PROVENTIL HFA;VENTOLIN HFA) 108 (90 BASE) MCG/ACT inhaler Inhale 2 puffs into the lungs every 4 (four) hours as needed for wheezing or shortness of breath (cough, shortness of breath or wheezing.). 07/22/13  Yes Roselee Culver, MD  amLODipine (NORVASC) 5 MG tablet Take 5 mg by mouth Daily.  02/24/12  Yes [provider]  aspirin 81 MG tablet Take 81 mg by mouth at bedtime.    Yes [provider]  baclofen (LIORESAL) 10 MG tablet TAKE 1 TABLET BY MOUTH TWICE A DAY AS NEEDED FOR SPASMS Patient taking differently: Take 10 mg by mouth 2 (two) times daily.  03/21/17  Yes Jessy Oto, MD  betamethasone dipropionate (DIPROLENE) 0.05 % ointment Apply 1 application topically daily.  01/01/19  Yes [provider]  budesonide (PULMICORT) 0.5 MG/2ML nebulizer solution Take 0.5 mg by nebulization 2 (two) times a day.    Yes [provider]  buPROPion (WELLBUTRIN XL) 150 MG 24 hr tablet Take 150 mg by mouth daily.  08/28/16  Yes [provider]  busPIRone (BUSPAR) 7.5 MG tablet Take 7.5 mg by mouth 2 (two) times daily.  07/22/16  Yes [provider]  dexamethasone (DECADRON) 6 MG tablet Take 6 mg by mouth daily.   Yes [provider]  doxycycline (MONODOX) 100 MG capsule Take 100 mg by mouth 2 (two) times daily. 11/23/17  Yes [provider]  DULoxetine (CYMBALTA) 60 MG capsule Take 60 mg by mouth daily. 11/19/18  Yes [provider]  fluticasone (FLONASE) 50 MCG/ACT nasal spray Place 2 sprays into both nostrils 2 (two) times a day.   Yes [provider]  folic acid (FOLVITE) 1 MG tablet Take 2 tablets (2 mg total) by mouth daily. 04/25/18 07/19/19 Yes Deveshwar, Abel Presto, MD  gabapentin (NEURONTIN) 300 MG capsule Take 600 mg by mouth 3 (three) times daily.  08/26/13  Yes [provider]  galantamine (RAZADYNE ER) 16 MG 24 hr capsule Take 16 mg by mouth daily with breakfast.  11/16/17  Yes [provider]  hydroxychloroquine (PLAQUENIL) 200 MG tablet TAKE 1 TABLET BY MOUTH TWICE A DAY 01/01/19  Yes Deveshwar, Abel Presto, MD  meloxicam (MOBIC) 15 MG tablet Take 15 mg by mouth daily.     Yes [provider]  Multiple Vitamins-Minerals (ZINC PO) Take 1 tablet by mouth daily.   Yes [provider]  nebivolol (BYSTOLIC) 10 MG tablet Take 10 mg by mouth daily.     Yes [provider]  nystatin (MYCOSTATIN) 100000 UNIT/ML suspension Take 5 mLs by mouth 4 (four) times daily. For 10 days   Yes [provider]  olmesartan-hydrochlorothiazide (BENICAR HCT) 40-25 MG tablet Take 1 tablet by mouth daily.  02/28/18  Yes [provider]  omeprazole (PRILOSEC) 20 MG capsule Take 20 mg by mouth daily.   Yes [provider]     Critical care time: 30 minutes     Roselie Awkward, MD [facility] Pager: [phone] Cell: [phone] If no response, call [phone]

## 2019-02-26 NOTE — Progress Notes (Signed)
PROGRESS NOTE    [name]  KCL:[id number] DOB: November 15, 1945 DOA: 02/24/2019 PCP: Crist Infante, MD      Brief Narrative:  [name] is a 73 y.o. F with asthma on 2 L O2, CAD, DM who presented with 7 days chest congestion, cough, now with progressive shortness of breath.  In the ER she had positive SARS-CoV-2, required 15 L high flow [place], and chest x-ray showed bilateral pneumonia.  She was given remdesivir, Actemra, and dexamethasone and transferred to [facility].       Assessment & Plan:  Coronavirus pneumonitis with acute hypoxic respiratory failure In setting of ongoing 2020 COVID-19 pandemic.  Uses 2L O2 at baseline (for chronic "asthma"), but was requiring 15L on admission.  Started on steroids and remdesivir at admission. S/p Actemra 8/2  Overnight, desaturating with minimal activity, required 15LHFNC + 10L NRB just for talking, eating.  During the day today, she has appeared to nursing and myself more tired, and complains of SOB.  This afternoon, she was too SOB to talk more than a few words.  -Transfer to ICU  -Continue remdesivir day 3 of 5 -Continue steroids day 3  -Continue VTE PPx with Lovneox -Continue Zinc and Vitamin C      Diabetes with hyperglycemia Controlled at baseline, A1c 8.1%, but uncontrolled/hyperglycemic here initially.  Glucoses now improved. -Continue Levemir -Continue mealtime insulin -Continue sliding scale corrections -Continue gabapentin  Coronary disease Hypertension BP high normal -Continue aspirin -Continue amlodipine, irbesartan, HCTZ, Bystolic -Labetalol as needed for severe range pressures  Rheumatoid arthritis No active disease -Continue Plaquenil  Asthma No active wheezing. -Albuterol PRN -Continue budesonide  Other medications -Continue duloxetine, BuSpar, Wellbutrin -Continue galantamine -Continue pantoprazole -Continue baclofen  Recent cellulitis Venous stasis ulcer Patient has venous stasis  changes and longstanding ulcers on legs, which have relapsing cellulitis.  She was recently treated with two courses of antibiotics (doxycycline and then cephalexin converted to doxy to cover a sinus infection) prior to admission.  She arrived on antibiotics for suspected left leg cellulitis, but these have been discontinued and I do not appreciate infection now. -Observe off antibiotics for now        MDM and disposition: The below labs and imaging reports were reviewed and summarized above.  Medication management as above.    The patient was admitted with severe COVID-19, which is a severe acute illness with threat to life or bodily function.  During the course of the day, she has appeared more and more tired and out of breath.  We will transfer to the intensive care unit at this time for evaluation by pulmonary/critical care and evaluation for heated high flow.      DVT prophylaxis: Lovenox Code Status: FULL Family Communication: Daughter by phone    Antimicrobials:   Doxycycline x1       Subjective: Very tired.  Chest heavy, and very out of breath.  Still coughing, no sputum or hemoptysis.  No chest pain, confusion, vomiting, diarrhea.  Subjective fever, but no measured fever.      Objective: Vitals:   02/26/19 0543 02/26/19 0756 02/26/19 1035 02/26/19 1226  BP:  (!) 153/80  128/68  Pulse:  61  63  Resp:    (!) 25  Temp:  98.9 F (37.2 C)  97.9 F (36.6 C)  TempSrc:  Oral  Oral  SpO2: (!) 89% 94% 92% (!) 89%  Weight:      Height:        Intake/Output  Summary (Last 24 hours) at 02/26/2019 1529 Last data filed at 02/26/2019 1000 Gross per 24 hour  Intake 240 ml  Output 550 ml  Net -310 ml   Filed Weights   02/24/19 2322 02/24/19 2335  Weight: 110 kg 102.8 kg    Examination: General appearance: Obese adult female, lying in bed, prone, appears tired and sweaty. HEENT: Anicteric, conjunctival pink, lids and lashes normal.  No nasal deformity, discharge,  or epistaxis.  Lips moist, dentition normal, oropharynx moist, no oral lesions, hearing normal. Skin: Skin warm and dry.  There is a wine bottle deformity of the lower extremities bilaterally, thickened hard skin at the ankles bilaterally, but no tenderness, redness, or induration around the left medial venous ulcer to suggest infection.   Cardiac: Tachycardic, regular, JVP not visible, no lower extremity edema. Respiratory: Respiratory rate increased, appears out of breath, lung sounds diminished bilaterally, no rales or wheezes. Abdomen: Prone.  Engine. MSK: No deformities or effusions of the large joints of the upper or lower extremities bilaterally. Neuro: Awake and alert, extraocular movements intact, moves all extremities with normal strength and coordination, speech fluent. Psych: Sensorium intact responding to questions, attention normal, affect normal, judgment insight normal.      Data Reviewed: I have personally reviewed following labs and imaging studies:  CBC: Recent Labs  Lab 02/24/19 1630 02/26/19 0250  WBC 8.9 10.0  NEUTROABS 7.4 7.5  HGB 15.3* 14.6  HCT 46.3* 44.7  MCV 91.1 91.8  PLT 199 465   Basic Metabolic Panel: Recent Labs  Lab 02/24/19 1630 02/26/19 0250  NA 136 136  K 3.9 3.6  CL 97* 99  CO2 22 24  GLUCOSE 302* 184*  BUN 18 20  CREATININE 0.55 0.52  CALCIUM 9.0 9.2   GFR: Estimated Creatinine Clearance: 72 mL/min (by C-G formula based on SCr of 0.52 mg/dL). Liver Function Tests: Recent Labs  Lab 02/24/19 1630 02/26/19 0250  AST 30 27  ALT 24 22  ALKPHOS 54 51  BILITOT 0.5 0.5  PROT 8.0 7.7  ALBUMIN 3.7 3.4*   No results for input(s): LIPASE, AMYLASE in the last 168 hours. No results for input(s): AMMONIA in the last 168 hours. Coagulation Profile: No results for input(s): INR, PROTIME in the last 168 hours. Cardiac Enzymes: No results for input(s): CKTOTAL, CKMB, CKMBINDEX, TROPONINI in the last 168 hours. BNP (last 3 results) No  results for input(s): PROBNP in the last 8760 hours. HbA1C: Recent Labs    02/26/19 0250  HGBA1C 8.1*   CBG: Recent Labs  Lab 02/25/19 1650 02/25/19 2106 02/26/19 0754 02/26/19 1154 02/26/19 1515  GLUCAP 257* 174* 140* 203* 310*   Lipid Profile: Recent Labs    02/24/19 1630  TRIG 121   Thyroid Function Tests: No results for input(s): TSH, T4TOTAL, FREET4, T3FREE, THYROIDAB in the last 72 hours. Anemia Panel: Recent Labs    02/24/19 1630  FERRITIN 361*   Urine analysis:    Component Value Date/Time   COLORURINE YELLOW 04/08/2011 0950   APPEARANCEUR CLOUDY (A) 04/08/2011 0950   LABSPEC 1.034 (H) 04/08/2011 0950   PHURINE 6.0 04/08/2011 0950   GLUCOSEU NEGATIVE 04/08/2011 0950   HGBUR NEGATIVE 04/08/2011 0950   BILIRUBINUR NEGATIVE 04/08/2011 0950   KETONESUR NEGATIVE 04/08/2011 0950   PROTEINUR NEGATIVE 04/08/2011 0950   UROBILINOGEN 0.2 04/08/2011 0950   NITRITE NEGATIVE 04/08/2011 0950   LEUKOCYTESUR NEGATIVE 04/08/2011 0950   Sepsis Labs: @LABRCNTIP (procalcitonin:4,lacticacidven:4)  ) Recent Results (from the past 240 hour(s))  SARS  Coronavirus 2 [facility] order, Performed in [facility] hospital lab) Nasopharyngeal Nasopharyngeal Swab     Status: Abnormal   Collection Time: 02/24/19  4:30 PM   Specimen: Nasopharyngeal Swab  Result Value Ref Range Status   SARS Coronavirus 2 POSITIVE (A) NEGATIVE Final    Comment: RESULT CALLED TO, READ BACK BY AND VERIFIED WITH: HODGES,C RN @2003  ON 02/24/2019 JACKSON,K (NOTE) If result is NEGATIVE SARS-CoV-2 target nucleic acids are NOT DETECTED. The SARS-CoV-2 RNA is generally detectable in upper and lower  respiratory specimens during the acute phase of infection. The lowest  concentration of SARS-CoV-2 viral copies this assay can detect is 250  copies / mL. A negative result does not preclude SARS-CoV-2 infection  and should not be used as the sole basis for treatment or other  patient management decisions.  A  negative result may occur with  improper specimen collection / handling, submission of specimen other  than nasopharyngeal swab, presence of viral mutation(s) within the  areas targeted by this assay, and inadequate number of viral copies  (<250 copies / mL). A negative result must be combined with clinical  observations, patient history, and epidemiological information. If result is POSITIVE SARS-CoV-2 target nucleic acids are DETECTED . The SARS-CoV-2 RNA is generally detectable in upper and lower  respiratory specimens during the acute phase of infection.  Positive  results are indicative of active infection with SARS-CoV-2.  Clinical  correlation with patient history and other diagnostic information is  necessary to determine patient infection status.  Positive results do  not rule out bacterial infection or co-infection with other viruses. If result is PRESUMPTIVE POSTIVE SARS-CoV-2 nucleic acids MAY BE PRESENT.   A presumptive positive result was obtained on the submitted specimen  and confirmed on repeat testing.  While 2019 novel coronavirus  (SARS-CoV-2) nucleic acids may be present in the submitted sample  additional confirmatory testing may be necessary for epidemiological  and / or clinical management purposes  to differentiate between  SARS-CoV-2 and other Sarbecovirus currently known to infect humans.  If clinically indicated additional testing with an alternate test  methodology [phone] ) is advised. The SARS-CoV-2 RNA is generally  detectable in upper and lower respiratory specimens during the acute  phase of infection. The expected result is Negative. Fact Sheet for Patients:  StrictlyIdeas.no Fact Sheet for Healthcare Providers: BankingDealers.co.za This test is not yet approved or cleared by the [place] FDA and has been authorized for detection and/or diagnosis of SARS-CoV-2 by FDA under an Emergency Use  Authorization (EUA).  This EUA will remain in effect (meaning this test can be used) for the duration of the COVID-19 declaration under Section 564(b)(1) of the Act, 21 U.S.C. section 360bbb-3(b)(1), unless the authorization is terminated or revoked sooner. Performed at [facility], [place] [address] [place], [place] [address]          Radiology Studies: Portable Chest 1 View  Result Date: 02/25/2019 CLINICAL DATA:  Shortness of breath. EXAM: PORTABLE CHEST 1 VIEW COMPARISON:  Yesterday FINDINGS: Stable bilateral pulmonary infiltrate, greater on the left. Stable cardiomegaly. There has been CABG. No evident edema, effusion, or pneumothorax. IMPRESSION: Stable bilateral pneumonia. Electronically Signed   By: Monte Fantasia M.D.   On: 02/25/2019 04:40   Dg Chest Port 1 View  Result Date: 02/24/2019 CLINICAL DATA:  Shortness of breath.  Covid positive. EXAM: PORTABLE CHEST 1 VIEW COMPARISON:  11/27/2017 FINDINGS: Previous median sternotomy. Heart size is enlarged. Left mid lung and left  base airspace opacities are new from the previous exam compatible with multifocal pneumonia. The right lung appears clear. No edema or effusion. IMPRESSION: 1. Multifocal airspace densities within the left lung compatible with infection. Electronically Signed   By: Kerby Moors M.D.   On: 02/24/2019 17:26        Scheduled Meds: . amLODipine  5 mg Oral Daily  . aspirin  81 mg Oral QHS  . baclofen  10 mg Oral BID  . budesonide  1 puff Inhalation BID  . buPROPion  150 mg Oral Daily  . busPIRone  7.5 mg Oral BID  . dexamethasone  6 mg Oral Daily  . DULoxetine  60 mg Oral Daily  . enoxaparin (LOVENOX) injection  40 mg Subcutaneous Q24H  . fluticasone  2 spray Each Nare BID  . folic acid  2 mg Oral Daily  . gabapentin  600 mg Oral TID  . galantamine  16 mg Oral Q breakfast  . irbesartan  300 mg Oral Daily   And  . hydrochlorothiazide  25 mg Oral Daily  . hydroxychloroquine  200  mg Oral BID  . insulin aspart  0-20 Units Subcutaneous TID WC  . insulin aspart  0-5 Units Subcutaneous QHS  . insulin aspart  3 Units Subcutaneous TID WC  . insulin detemir  0.1 Units/kg Subcutaneous BID  . nebivolol  10 mg Oral Daily  . nystatin  5 mL Oral QID  . pantoprazole  40 mg Oral Daily  . senna  1 tablet Oral BID  . sodium chloride flush  3 mL Intravenous Q12H  . triamcinolone cream   Topical BID  . vitamin C  500 mg Oral Daily  . zinc sulfate  220 mg Oral Daily   Continuous Infusions: . remdesivir 100 mg in NS 250 mL Stopped (02/25/19 2144)     LOS: 2 days    Time spent: 35 minutes      Edwin Dada, MD [facility] 02/26/2019, 3:29 PM     Please page through [place]:  [email or web address] Password [email or web address] If 7PM-7AM, please contact night-coverage

## 2019-02-27 DIAGNOSIS — I2510 Atherosclerotic heart disease of native coronary artery without angina pectoris: Secondary | ICD-10-CM

## 2019-02-27 DIAGNOSIS — E1165 Type 2 diabetes mellitus with hyperglycemia: Secondary | ICD-10-CM

## 2019-02-27 DIAGNOSIS — I10 Essential (primary) hypertension: Secondary | ICD-10-CM | POA: Diagnosis present

## 2019-02-27 DIAGNOSIS — E7800 Pure hypercholesterolemia, unspecified: Secondary | ICD-10-CM

## 2019-02-27 DIAGNOSIS — R9431 Abnormal electrocardiogram [ECG] [EKG]: Secondary | ICD-10-CM | POA: Clinically undetermined

## 2019-02-27 DIAGNOSIS — J9621 Acute and chronic respiratory failure with hypoxia: Secondary | ICD-10-CM

## 2019-02-27 DIAGNOSIS — C50919 Malignant neoplasm of unspecified site of unspecified female breast: Secondary | ICD-10-CM | POA: Diagnosis present

## 2019-02-27 DIAGNOSIS — E785 Hyperlipidemia, unspecified: Secondary | ICD-10-CM | POA: Diagnosis present

## 2019-02-27 DIAGNOSIS — E118 Type 2 diabetes mellitus with unspecified complications: Secondary | ICD-10-CM

## 2019-02-27 DIAGNOSIS — E1140 Type 2 diabetes mellitus with diabetic neuropathy, unspecified: Secondary | ICD-10-CM | POA: Diagnosis present

## 2019-02-27 DIAGNOSIS — E0841 Diabetes mellitus due to underlying condition with diabetic mononeuropathy: Secondary | ICD-10-CM

## 2019-02-27 DIAGNOSIS — Z923 Personal history of irradiation: Secondary | ICD-10-CM

## 2019-02-27 DIAGNOSIS — Z9221 Personal history of antineoplastic chemotherapy: Secondary | ICD-10-CM

## 2019-02-27 LAB — COMPREHENSIVE METABOLIC PANEL
ALT: 37 U/L (ref 0–44)
AST: 47 U/L — ABNORMAL HIGH (ref 15–41)
Albumin: 3.3 g/dL — ABNORMAL LOW (ref 3.5–5.0)
Alkaline Phosphatase: 49 U/L (ref 38–126)
Anion gap: 9 (ref 5–15)
BUN: 24 mg/dL — ABNORMAL HIGH (ref 8–23)
CO2: 24 mmol/L (ref 22–32)
Calcium: 8.8 mg/dL — ABNORMAL LOW (ref 8.9–10.3)
Chloride: 105 mmol/L (ref 98–111)
Creatinine, Ser: 0.63 mg/dL (ref 0.44–1.00)
GFR calc Af Amer: >60 mL/min (ref >60)
GFR calc non Af Amer: >60 mL/min (ref >60)
Glucose, Bld: 135 mg/dL — ABNORMAL HIGH (ref 70–99)
Potassium: 3.9 mmol/L (ref 3.5–5.1)
Sodium: 138 mmol/L (ref 135–145)
Total Bilirubin: 0.8 mg/dL (ref 0.3–1.2)
Total Protein: 7.1 g/dL (ref 6.5–8.1)

## 2019-02-27 LAB — CBC WITH DIFFERENTIAL/PLATELET
Abs Immature Granulocytes: 0.18 10*3/uL — ABNORMAL HIGH (ref 0.00–0.07)
Basophils Absolute: 0 10*3/uL (ref 0.0–0.1)
Basophils Relative: 0 %
Eosinophils Absolute: 0 10*3/uL (ref 0.0–0.5)
Eosinophils Relative: 0 %
HCT: 47 % — ABNORMAL HIGH (ref 36.0–46.0)
Hemoglobin: 15.3 g/dL — ABNORMAL HIGH (ref 12.0–15.0)
Immature Granulocytes: 2 %
Lymphocytes Relative: 21 %
Lymphs Abs: 1.6 10*3/uL (ref 0.7–4.0)
MCH: 29.8 pg (ref 26.0–34.0)
MCHC: 32.6 g/dL (ref 30.0–36.0)
MCV: 91.6 fL (ref 80.0–100.0)
Monocytes Absolute: 0.4 10*3/uL (ref 0.1–1.0)
Monocytes Relative: 5 %
Neutro Abs: 5.3 10*3/uL (ref 1.7–7.7)
Neutrophils Relative %: 72 %
Platelets: 250 10*3/uL (ref 150–400)
RBC: 5.13 MIL/uL — ABNORMAL HIGH (ref 3.87–5.11)
RDW: 12.8 % (ref 11.5–15.5)
WBC: 7.6 10*3/uL (ref 4.0–10.5)
nRBC: 0 % (ref 0.0–0.2)

## 2019-02-27 LAB — GLUCOSE, CAPILLARY
Glucose-Capillary: 111 mg/dL — ABNORMAL HIGH (ref 70–99)
Glucose-Capillary: 183 mg/dL — ABNORMAL HIGH (ref 70–99)
Glucose-Capillary: 300 mg/dL — ABNORMAL HIGH (ref 70–99)
Glucose-Capillary: 295 mg/dL — ABNORMAL HIGH (ref 70–99)

## 2019-02-27 LAB — MRSA PCR SCREENING: MRSA by PCR: NEGATIVE

## 2019-02-27 MED ORDER — CHLORHEXIDINE GLUCONATE CLOTH 2 % EX PADS
6.0000 | MEDICATED_PAD | Freq: Every day | CUTANEOUS | Status: DC
  Administered 2019-02-28 – 2019-03-07 (×8): 6 via TOPICAL

## 2019-02-27 MED ORDER — METHYLPREDNISOLONE SODIUM SUCC 125 MG IJ SOLR
60.0000 mg | Freq: Three times a day (TID) | INTRAMUSCULAR | Status: DC
  Administered 2019-02-27 – 2019-03-01 (×6): 60 mg via INTRAVENOUS
  Filled 2019-02-27 (×7): qty 2

## 2019-02-27 MED ORDER — ENOXAPARIN SODIUM 60 MG/0.6ML ~~LOC~~ SOLN
0.5000 mg/kg | Freq: Two times a day (BID) | SUBCUTANEOUS | Status: DC
  Administered 2019-02-27 – 2019-03-03 (×8): 50 mg via SUBCUTANEOUS
  Filled 2019-02-27 (×8): qty 0.6

## 2019-02-27 MED ORDER — ALBUTEROL SULFATE HFA 108 (90 BASE) MCG/ACT IN AERS
2.0000 | INHALATION_SPRAY | Freq: Two times a day (BID) | RESPIRATORY_TRACT | Status: DC
  Administered 2019-02-27 (×2): 2 via RESPIRATORY_TRACT

## 2019-02-27 MED ORDER — ALBUTEROL SULFATE HFA 108 (90 BASE) MCG/ACT IN AERS
2.0000 | INHALATION_SPRAY | Freq: Two times a day (BID) | RESPIRATORY_TRACT | Status: DC
  Administered 2019-02-28 – 2019-03-07 (×15): 2 via RESPIRATORY_TRACT

## 2019-02-27 NOTE — Progress Notes (Signed)
PROGRESS NOTE    RETA Shelton  PRF:[id number] DOB: 1945/12/09 DOA: 02/24/2019 PCP: Crist Infante, MD   Brief Narrative:  73 y.o. WF PMHx depression, asthma on 2 L of oxygen OSA, breast cancer s/p chemotherapy+ XRT chronic back pain, diabetes type 2 controlled with complication (diabetic peripheral neuropathy,, HLD, HTN, CAD, RA, OA, IBS  comes in with chief complaint of shortness of breath. She also has history of CAD, diabetes.Patient states that on Monday she started having some nasal congestion. She followed up with her PCP and had a test for COVID-19 that day which came back positive on Wednesday. Over the last couple of days she has had increased shortness of breath and decided to come to the ER.  According to EMS patient's O2 sats were in the low 80s when they arrived. She is currently on 15 L/nonrebreather.She is having shortness of breath with exertion and feeling of dizziness. She has a mild cough. Patient is also having subjective low-grade temps and weakness.  Pt has no hx of PE, DVT and denies any exogenous hormone (testosterone / estrogen) use, long distance travels or surgery in the past 6 weeks, active cancer, recent immobilization.note).  ED Course: In the emergency department patient had routine labs drawn which revealed elevation in inflammatory markers.  He was put to 15 L high flow nasal cannula improvement of her oxygen saturations to greater than 90%.  X-ray was performed which revealed a 19 pneumonia type changes.  She did complain of a sniffing and headache and she was given dexamethasone 10 mg IV along with a headache cocktail.  Patient referred for admission to [facility] to a progressive bed due to need for high flow nasal cannula oxygen.  Review of Systems: As per HPI otherwise 10 point review of systems negative. She does admit to cough that is non-productive, endorses nauses, light-headedness, continued headache.   Subjective: A/O x4, positive S OB, positive  DOE, negative abdominal pain, negative N/V.  States her respiratory status feels better than on 8/4.  Currently sitting in chair.   Assessment & Plan:   Active Problems:   Coronary artery disease   Hypertension   Diabetes mellitus, type II (HCC)   OSA (obstructive sleep apnea)   Pneumonia due to COVID-19 virus   Breast cancer (HCC)   History of cancer chemotherapy   History of radiation therapy   Diabetic neuropathy (HCC)   HLD (hyperlipidemia)   Essential hypertension   QT prolongation  Acute on chronic respiratory failure with hypoxia/COVID 19 pneumonia -Patient normally on 2 L O2 via [place] at home - Solu-Medrol 60 mg TID -Remdesivir per pharmacy protocol - Actemra - Albuterol twice daily (patient has prolonged QT interval want to be cautious) - Court twice daily - Vitamins per COVID protocol - Daily COVID inflammatory markers pending Recent Labs  Lab 02/24/19 1630  CRP 10.7*   Recent Labs  Lab 02/24/19 1630  DDIMER 0.46    Asthma -See see acute on chronic respiratory failure  OSA - Currently on HHFNC  Diabetes type 2 uncontrolled with complication -8/4 hemoglobin A1c= 8.1 - Lipid panel pending - Levemir 10 units twice daily - NovoLog 3 units QAC - Resistant SSI  Essential HTN -Amlodipine 5 mg daily - Eva Sartain 300 mg daily -HCTZ 20 mg daily - Labetalol PRN  RA - Hold all NSAIDs - Baclofen 10 mg twice daily - Cymbalta 60 mg daily - Gabapentin 600 mg 3 times daily - Plaquenil 200 mg twice daily  QT  prolongation -8/2 EKG QTC prolongation resolved.  Continue to monitor closely    DVT prophylaxis: Lovenox Code Status: Full Family Communication:  Disposition Plan: TBD   Consultants:  PCCM    Procedures/Significant Events:     I have personally reviewed and interpreted all radiology studies and my findings are as above.  VENTILATOR SETTINGS: HHFNC Heater temp: 32 C O2: 25 L/min FiO2: 100% SPO2 88%    Cultures    Antimicrobials: Anti-infectives (From admission, onward)   Start     Stop   02/25/19 2200  remdesivir 100 mg in sodium chloride 0.9 % 250 mL IVPB     03/01/19 2159   02/25/19 1000  doxycycline (VIBRA-TABS) tablet 100 mg  Status:  Discontinued     02/25/19 0728   02/25/19 0100  remdesivir 200 mg in sodium chloride 0.9 % 250 mL IVPB     02/25/19 0149   02/24/19 2330  hydroxychloroquine (PLAQUENIL) tablet 200 mg         02/24/19 2200  doxycycline (VIBRA-TABS) tablet 100 mg  Status:  Discontinued     02/24/19 2344       Devices    LINES / TUBES:      Continuous Infusions: . remdesivir 100 mg in NS 250 mL Stopped (02/26/19 2251)     Objective: Vitals:   02/27/19 0500 02/27/19 0602 02/27/19 0700 02/27/19 0724  BP: 125/67 139/65 128/64 128/64  Pulse: (!) 57 (!) 59 61 61  Resp: 18 19 20  (!) 21  Temp:      TempSrc:      SpO2: (!) 88% (!) 86% 90% (!) 88%  Weight:      Height:        Intake/Output Summary (Last 24 hours) at 02/27/2019 0741 Last data filed at 02/27/2019 0555 Gross per 24 hour  Intake 980 ml  Output 350 ml  Net 630 ml   Filed Weights   02/24/19 2322 02/24/19 2335 02/27/19 0400  Weight: 110 kg 102.8 kg 102.9 kg    Examination:  General: A/O x4, positive acute on chronic respiratory distress Eyes: negative scleral hemorrhage, negative anisocoria, negative icterus ENT: Negative Runny nose, negative gingival bleeding, Neck:  Negative scars, masses, torticollis, lymphadenopathy, JVD Lungs: Tachypneic, decreased breath sounds diffusely, without wheezes or crackles Cardiovascular: Regular rate and rhythm without murmur gallop or rub normal S1 and S2 Abdomen: negative abdominal pain, nondistended, positive soft, bowel sounds, no rebound, no ascites, no appreciable mass Extremities: No significant cyanosis, clubbing, or edema bilateral lower extremities Skin: Negative rashes, lesions, ulcers Psychiatric:  Negative depression, negative anxiety, negative  fatigue, negative mania  Central nervous system:  Cranial nerves II through XII intact, tongue/uvula midline, all extremities muscle strength 5/5, sensation intact throughout, negative dysarthria, negative expressive aphasia, negative receptive aphasia.  .     Data Reviewed: Care during the described time interval was provided by me .  I have reviewed this patient's available data, including medical history, events of note, physical examination, and all test results as part of my evaluation.   CBC: Recent Labs  Lab 02/24/19 1630 02/26/19 0250 02/27/19 0612  WBC 8.9 10.0 7.6  NEUTROABS 7.4 7.5 5.3  HGB 15.3* 14.6 15.3*  HCT 46.3* 44.7 47.0*  MCV 91.1 91.8 91.6  PLT 199 286 220   Basic Metabolic Panel: Recent Labs  Lab 02/24/19 1630 02/26/19 0250 02/27/19 0612  NA 136 136 138  K 3.9 3.6 3.9  CL 97* 99 105  CO2 22 24 24   GLUCOSE  302* 184* 135*  BUN 18 20 24*  CREATININE 0.55 0.52 0.63  CALCIUM 9.0 9.2 8.8*   GFR: Estimated Creatinine Clearance: 72.1 mL/min (by C-G formula based on SCr of 0.63 mg/dL). Liver Function Tests: Recent Labs  Lab 02/24/19 1630 02/26/19 0250 02/27/19 0612  AST 30 27 47*  ALT 24 22 37  ALKPHOS 54 51 49  BILITOT 0.5 0.5 0.8  PROT 8.0 7.7 7.1  ALBUMIN 3.7 3.4* 3.3*   No results for input(s): LIPASE, AMYLASE in the last 168 hours. No results for input(s): AMMONIA in the last 168 hours. Coagulation Profile: No results for input(s): INR, PROTIME in the last 168 hours. Cardiac Enzymes: No results for input(s): CKTOTAL, CKMB, CKMBINDEX, TROPONINI in the last 168 hours. BNP (last 3 results) No results for input(s): PROBNP in the last 8760 hours. HbA1C: Recent Labs    02/26/19 0250  HGBA1C 8.1*   CBG: Recent Labs  Lab 02/25/19 2106 02/26/19 0754 02/26/19 1154 02/26/19 1515 02/26/19 2113  GLUCAP 174* 140* 203* 310* 201*   Lipid Profile: Recent Labs    02/24/19 1630  TRIG 121   Thyroid Function Tests: No results for  input(s): TSH, T4TOTAL, FREET4, T3FREE, THYROIDAB in the last 72 hours. Anemia Panel: Recent Labs    02/24/19 1630  FERRITIN 361*   Urine analysis:    Component Value Date/Time   COLORURINE YELLOW 04/08/2011 0950   APPEARANCEUR CLOUDY (A) 04/08/2011 0950   LABSPEC 1.034 (H) 04/08/2011 0950   PHURINE 6.0 04/08/2011 0950   GLUCOSEU NEGATIVE 04/08/2011 0950   HGBUR NEGATIVE 04/08/2011 0950   BILIRUBINUR NEGATIVE 04/08/2011 0950   KETONESUR NEGATIVE 04/08/2011 0950   PROTEINUR NEGATIVE 04/08/2011 0950   UROBILINOGEN 0.2 04/08/2011 0950   NITRITE NEGATIVE 04/08/2011 0950   LEUKOCYTESUR NEGATIVE 04/08/2011 0950   Sepsis Labs: @LABRCNTIP (procalcitonin:4,lacticidven:4)  ) Recent Results (from the past 240 hour(s))  SARS Coronavirus 2 [facility] order, Performed in [facility] hospital lab) Nasopharyngeal Nasopharyngeal Swab     Status: Abnormal   Collection Time: 02/24/19  4:30 PM   Specimen: Nasopharyngeal Swab  Result Value Ref Range Status   SARS Coronavirus 2 POSITIVE (A) NEGATIVE Final    Comment: RESULT CALLED TO, READ BACK BY AND VERIFIED WITH: HODGES,C RN @2003  ON 02/24/2019 JACKSON,K (NOTE) If result is NEGATIVE SARS-CoV-2 target nucleic acids are NOT DETECTED. The SARS-CoV-2 RNA is generally detectable in upper and lower  respiratory specimens during the acute phase of infection. The lowest  concentration of SARS-CoV-2 viral copies this assay can detect is 250  copies / mL. A negative result does not preclude SARS-CoV-2 infection  and should not be used as the sole basis for treatment or other  patient management decisions.  A negative result may occur with  improper specimen collection / handling, submission of specimen other  than nasopharyngeal swab, presence of viral mutation(s) within the  areas targeted by this assay, and inadequate number of viral copies  (<250 copies / mL). A negative result must be combined with clinical  observations, patient history, and  epidemiological information. If result is POSITIVE SARS-CoV-2 target nucleic acids are DETECTED . The SARS-CoV-2 RNA is generally detectable in upper and lower  respiratory specimens during the acute phase of infection.  Positive  results are indicative of active infection with SARS-CoV-2.  Clinical  correlation with patient history and other diagnostic information is  necessary to determine patient infection status.  Positive results do  not rule out bacterial infection or co-infection with other viruses.  If result is PRESUMPTIVE POSTIVE SARS-CoV-2 nucleic acids MAY BE PRESENT.   A presumptive positive result was obtained on the submitted specimen  and confirmed on repeat testing.  While 2019 novel coronavirus  (SARS-CoV-2) nucleic acids may be present in the submitted sample  additional confirmatory testing may be necessary for epidemiological  and / or clinical management purposes  to differentiate between  SARS-CoV-2 and other Sarbecovirus currently known to infect humans.  If clinically indicated additional testing with an alternate test  methodology [phone] ) is advised. The SARS-CoV-2 RNA is generally  detectable in upper and lower respiratory specimens during the acute  phase of infection. The expected result is Negative. Fact Sheet for Patients:  StrictlyIdeas.no Fact Sheet for Healthcare Providers: BankingDealers.co.za This test is not yet approved or cleared by the [place] FDA and has been authorized for detection and/or diagnosis of SARS-CoV-2 by FDA under an Emergency Use Authorization (EUA).  This EUA will remain in effect (meaning this test can be used) for the duration of the COVID-19 declaration under Section 564(b)(1) of the Act, 21 U.S.C. section 360bbb-3(b)(1), unless the authorization is terminated or revoked sooner. Performed at [facility], [place] [address] [place], [place] [address]           Radiology Studies: No results found.      Scheduled Meds: . amLODipine  5 mg Oral Daily  . aspirin  81 mg Oral QHS  . baclofen  10 mg Oral BID  . budesonide  1 puff Inhalation BID  . buPROPion  150 mg Oral Daily  . busPIRone  7.5 mg Oral BID  . dexamethasone  6 mg Oral Daily  . DULoxetine  60 mg Oral Daily  . enoxaparin (LOVENOX) injection  40 mg Subcutaneous BID  . fluticasone  2 spray Each Nare BID  . folic acid  2 mg Oral Daily  . gabapentin  600 mg Oral TID  . galantamine  16 mg Oral Q breakfast  . irbesartan  300 mg Oral Daily   And  . hydrochlorothiazide  25 mg Oral Daily  . hydroxychloroquine  200 mg Oral BID  . insulin aspart  0-20 Units Subcutaneous TID WC  . insulin aspart  0-5 Units Subcutaneous QHS  . insulin aspart  3 Units Subcutaneous TID WC  . insulin detemir  0.1 Units/kg Subcutaneous BID  . nebivolol  10 mg Oral Daily  . nystatin  5 mL Oral QID  . pantoprazole  40 mg Oral Daily  . potassium chloride  20 mEq Oral Daily  . senna  1 tablet Oral BID  . sodium chloride flush  3 mL Intravenous Q12H  . triamcinolone cream   Topical BID  . vitamin C  500 mg Oral Daily  . zinc sulfate  220 mg Oral Daily   Continuous Infusions: . remdesivir 100 mg in NS 250 mL Stopped (02/26/19 2251)     LOS: 3 days   The patient is critically ill with multiple organ systems failure and requires high complexity decision making for assessment and support, frequent evaluation and titration of therapies, application of advanced monitoring technologies and extensive interpretation of multiple databases. Critical Care Time devoted to patient care services described in this note  Time spent: 40 minutes     Zharia Conrow, Geraldo Docker, MD [facility] Pager [phone]  If 7PM-7AM, please contact night-coverage [email or web address] Password [email or web address] 02/27/2019, 7:41 AM

## 2019-02-27 NOTE — Progress Notes (Signed)
Patient mildly anxious at beginning of shift, prn ativan given with good effect.  Plaquenil dose held per Dr. Olevia Bowens. QTc 482 on EKG. Patient desat to 75% when up to [facility]. Able to recover sats to mid-upper 80s without any upward titration of O2. Patient tolerated prone position from 0005-0215, O2 sats in 90s-100%.

## 2019-02-27 NOTE — Progress Notes (Addendum)
Inpatient Diabetes Program Recommendations  AACE/ADA: New Consensus Statement on Inpatient Glycemic Control (2015)  Target Ranges:  Prepandial:   less than 140 mg/dL      Peak postprandial:   less than 180 mg/dL (1-2 hours)      Critically ill patients:  140 - 180 mg/dL    Results for ARINA, TORRY (MRN [id number]) as of 02/27/2019 07:59  Ref. Range 02/26/2019 07:54 02/26/2019 11:54 02/26/2019 15:15 02/26/2019 21:13  Glucose-Capillary Latest Ref Range: 70 - 99 mg/dL 140 (H)  6 units NOVOLOG +  10 units LEVEMIR  203 (H)  10 units NOVOLOG  310 (H)  18 units NOVOLOG  201 (H)  2 units NOVOLOG +  10 units LEVEMIR    Results for RECIA, SONS (MRN [id number]) as of 02/27/2019 07:59  Ref. Range 02/27/2019 07:37  Glucose-Capillary Latest Ref Range: 70 - 99 mg/dL 111 (H)    Home DM Meds: None--Diet Controlled  Current Orders: Levemir 10 units BID      Novolog Resistant Correction Scale/ SSI (0-20 units) TID AC + HS      Novolog 3 units TID with meals      MD- Note fasting CBG looks good the last 2 days.  Would leave Levemir at current dose.  Pt having issues with elevated afternoon CBGs which is likely due to the Decadron 6 mg Daily she is getting.  Please consider increasing Novolog Meal Coverage to:  Novolog 6 units TID with meals while patient remains on Decadron.     --Will follow patient during hospitalization--  Wyn Quaker RN, MSN, CDE Diabetes Coordinator Inpatient Glycemic Control Team Team Pager: [phone] (8a-5p)

## 2019-02-27 NOTE — Progress Notes (Signed)
NAME:  [name], MRN:  [id number], DOB:  1946/03/23, LOS: 3 ADMISSION DATE:  02/24/2019, CONSULTATION DATE: 8/4 REFERRING MD: Dr. Loleta Books, CHIEF COMPLAINT: Dyspnea  Brief History   73 year old female with multiple medical problems admitted on 8/2 in the setting of severe acute respiratory failure with hypoxemia due to COVID-19 pneumonia.  Moved to the intensive care unit on 8/4 due to worsening hypoxemia.   Past Medical History  Hypertension Asthma History of breast cancer Coronary artery disease Nocturnal oxygen, 2 L at night Diabetes mellitus Gastroesophageal reflux disease Hypertension Hyperlipidemia Obstructive sleep apnea  Significant Hospital Events   August 2 admission August 4 transfer to intensive care unit for heated high flow  Consults:  Pulmonary and critical care medicine  Procedures:    Significant Diagnostic Tests:    Micro Data:  8/2 SARS COV 2 > positive  Antimicrobials:  August 2 doxycycline August 2 hydroxychloroquine August 2 remdesivir August 2 Actemra August 2 decadron  Interim history/subjective:  Rested comfortably Not short of breath while at rest Would like to have something to eat but does not have much appetite  Objective   Blood pressure 128/64, pulse 61, temperature 98.2 F (36.8 C), temperature source Oral, resp. rate 20, height 5\' 4"  (1.626 m), weight 102.9 kg, SpO2 90 %.    FiO2 (%):  [100 %] 100 %   Intake/Output Summary (Last 24 hours) at 02/27/2019 0729 Last data filed at 02/27/2019 0555 Gross per 24 hour  Intake 980 ml  Output 350 ml  Net 630 ml   Filed Weights   02/24/19 2322 02/24/19 2335 02/27/19 0400  Weight: 110 kg 102.8 kg 102.9 kg    Examination:  General:  Resting comfortably in bed HENT: NCAT OP clear PULM: Crackles bases B, normal effort CV: RRR, no mgr GI: BS+, soft, nontender MSK: normal bulk and tone Neuro: awake, alert, no distress, MAEW   August 3 chest x-ray images independently reviewed  showing left greater than right airspace disease in the bases  Resolved Hospital Problem list     Assessment & Plan:  Severe acute respiratory failure with hypoxemia secondary to COVID-19 pneumonia. At risk for ventilatory failure which currently showing no signs of that Maintain level of care in the ICU Continue heated high flow oxygen Continue remdesivir Continue decadron Tolerate periods of hypoxemia, goal at rest is greater than 85% SaO2, with movement ideally above 75% Decision for intubation should be based on a change in mental status or physical evidence of ventilatory failure such as nasal flaring, accessory muscle use, paradoxical breathing Out of bed to chair as able Incentive spirometry is important, use every hour Prone positioning while in bed  Asthma  Albuterol prn Monitor for wheezing  Best practice:  Diet: regular Pain/Anxiety/Delirium protocol (if indicated): n/a VAP protocol (if indicated): n/a DVT prophylaxis: lovenox bid GI prophylaxis: n/a Glucose control: per TRH Mobility:  Out of bed, ROM exercises Code Status: full Family Communication: will discuss with TRH Disposition: remain in ICU  Labs   CBC: Recent Labs  Lab 02/24/19 1630 02/26/19 0250 02/27/19 0612  WBC 8.9 10.0 7.6  NEUTROABS 7.4 7.5 5.3  HGB 15.3* 14.6 15.3*  HCT 46.3* 44.7 47.0*  MCV 91.1 91.8 91.6  PLT 199 286 716    Basic Metabolic Panel: Recent Labs  Lab 02/24/19 1630 02/26/19 0250 02/27/19 0612  NA 136 136 138  K 3.9 3.6 3.9  CL 97* 99 105  CO2 22 24 24   GLUCOSE 302* 184* 135*  BUN 18 20 24*  CREATININE 0.55 0.52 0.63  CALCIUM 9.0 9.2 8.8*   GFR: Estimated Creatinine Clearance: 72.1 mL/min (by C-G formula based on SCr of 0.63 mg/dL). Recent Labs  Lab 02/24/19 1630 02/26/19 0250 02/27/19 0612  PROCALCITON <0.10  --   --   WBC 8.9 10.0 7.6  LATICACIDVEN 1.7  --   --     Liver Function Tests: Recent Labs  Lab 02/24/19 1630 02/26/19 0250 02/27/19 0612   AST 30 27 47*  ALT 24 22 37  ALKPHOS 54 51 49  BILITOT 0.5 0.5 0.8  PROT 8.0 7.7 7.1  ALBUMIN 3.7 3.4* 3.3*   No results for input(s): LIPASE, AMYLASE in the last 168 hours. No results for input(s): AMMONIA in the last 168 hours.  ABG No results found for: PHART, PCO2ART, PO2ART, HCO3, TCO2, ACIDBASEDEF, O2SAT   Coagulation Profile: No results for input(s): INR, PROTIME in the last 168 hours.  Cardiac Enzymes: No results for input(s): CKTOTAL, CKMB, CKMBINDEX, TROPONINI in the last 168 hours.  HbA1C: Hgb A1c MFr Bld  Date/Time Value Ref Range Status  02/26/2019 02:50 AM 8.1 (H) 4.8 - 5.6 % Final    Comment:    (NOTE) Pre diabetes:          5.7%-6.4% Diabetes:              >6.4% Glycemic control for   <7.0% adults with diabetes   09/03/2015 11:08 AM 6.2 (H) 4.8 - 5.6 % Final    Comment:    (NOTE)         Pre-diabetes: 5.7 - 6.4         Diabetes: >6.4         Glycemic control for adults with diabetes: <7.0     CBG: Recent Labs  Lab 02/25/19 2106 02/26/19 0754 02/26/19 1154 02/26/19 1515 02/26/19 2113  GLUCAP 174* 140* 203* 310* 201*     Critical care time: 35 minutes     Roselie Awkward, MD [facility] Pager: [phone] Cell: [phone] If no response, call [phone]

## 2019-02-28 DIAGNOSIS — R0602 Shortness of breath: Secondary | ICD-10-CM

## 2019-02-28 DIAGNOSIS — G4733 Obstructive sleep apnea (adult) (pediatric): Secondary | ICD-10-CM

## 2019-02-28 DIAGNOSIS — J9621 Acute and chronic respiratory failure with hypoxia: Secondary | ICD-10-CM | POA: Diagnosis present

## 2019-02-28 DIAGNOSIS — E0842 Diabetes mellitus due to underlying condition with diabetic polyneuropathy: Secondary | ICD-10-CM

## 2019-02-28 DIAGNOSIS — E1165 Type 2 diabetes mellitus with hyperglycemia: Secondary | ICD-10-CM | POA: Diagnosis present

## 2019-02-28 DIAGNOSIS — Z9989 Dependence on other enabling machines and devices: Secondary | ICD-10-CM

## 2019-02-28 DIAGNOSIS — IMO0002 Reserved for concepts with insufficient information to code with codable children: Secondary | ICD-10-CM | POA: Diagnosis present

## 2019-02-28 LAB — CBC WITH DIFFERENTIAL/PLATELET
Abs Immature Granulocytes: 0.27 10*3/uL — ABNORMAL HIGH (ref 0.00–0.07)
Basophils Absolute: 0 10*3/uL (ref 0.0–0.1)
Basophils Relative: 0 %
Eosinophils Absolute: 0 10*3/uL (ref 0.0–0.5)
Eosinophils Relative: 0 %
HCT: 44.3 % (ref 36.0–46.0)
Hemoglobin: 14.6 g/dL (ref 12.0–15.0)
Immature Granulocytes: 3 %
Lymphocytes Relative: 10 %
Lymphs Abs: 1.1 10*3/uL (ref 0.7–4.0)
MCH: 29.7 pg (ref 26.0–34.0)
MCHC: 33 g/dL (ref 30.0–36.0)
MCV: 90 fL (ref 80.0–100.0)
Monocytes Absolute: 0.2 10*3/uL (ref 0.1–1.0)
Monocytes Relative: 2 %
Neutro Abs: 9.3 10*3/uL — ABNORMAL HIGH (ref 1.7–7.7)
Neutrophils Relative %: 85 %
Platelets: 299 10*3/uL (ref 150–400)
RBC: 4.92 MIL/uL (ref 3.87–5.11)
RDW: 12.3 % (ref 11.5–15.5)
WBC: 10.9 10*3/uL — ABNORMAL HIGH (ref 4.0–10.5)
nRBC: 0 % (ref 0.0–0.2)

## 2019-02-28 LAB — COMPREHENSIVE METABOLIC PANEL
ALT: 41 U/L (ref 0–44)
AST: 30 U/L (ref 15–41)
Albumin: 3.1 g/dL — ABNORMAL LOW (ref 3.5–5.0)
Alkaline Phosphatase: 50 U/L (ref 38–126)
Anion gap: 10 (ref 5–15)
BUN: 21 mg/dL (ref 8–23)
CO2: 22 mmol/L (ref 22–32)
Calcium: 8.5 mg/dL — ABNORMAL LOW (ref 8.9–10.3)
Chloride: 100 mmol/L (ref 98–111)
Creatinine, Ser: 0.57 mg/dL (ref 0.44–1.00)
GFR calc Af Amer: >60 mL/min (ref >60)
GFR calc non Af Amer: >60 mL/min (ref >60)
Glucose, Bld: 283 mg/dL — ABNORMAL HIGH (ref 70–99)
Potassium: 4.3 mmol/L (ref 3.5–5.1)
Sodium: 132 mmol/L — ABNORMAL LOW (ref 135–145)
Total Bilirubin: 0.6 mg/dL (ref 0.3–1.2)
Total Protein: 6.8 g/dL (ref 6.5–8.1)

## 2019-02-28 LAB — D-DIMER, QUANTITATIVE: D-Dimer, Quant: 1.66 ug/mL-FEU — ABNORMAL HIGH (ref 0.00–0.50)

## 2019-02-28 LAB — MAGNESIUM: Magnesium: 2.1 mg/dL (ref 1.7–2.4)

## 2019-02-28 LAB — LIPID PANEL
Cholesterol: 202 mg/dL — ABNORMAL HIGH (ref 0–200)
HDL: 36 mg/dL — ABNORMAL LOW (ref >40)
LDL Cholesterol: 142 mg/dL — ABNORMAL HIGH (ref 0–99)
Total CHOL/HDL Ratio: 5.6 RATIO
Triglycerides: 122 mg/dL (ref <150)
VLDL: 24 mg/dL (ref 0–40)

## 2019-02-28 LAB — GLUCOSE, CAPILLARY
Glucose-Capillary: 262 mg/dL — ABNORMAL HIGH (ref 70–99)
Glucose-Capillary: 276 mg/dL — ABNORMAL HIGH (ref 70–99)
Glucose-Capillary: 260 mg/dL — ABNORMAL HIGH (ref 70–99)
Glucose-Capillary: 257 mg/dL — ABNORMAL HIGH (ref 70–99)

## 2019-02-28 LAB — C-REACTIVE PROTEIN: CRP: <0.8 mg/dL (ref <1.0)

## 2019-02-28 MED ORDER — ZOLPIDEM TARTRATE 5 MG PO TABS
5.0000 mg | ORAL_TABLET | Freq: Every evening | ORAL | Status: DC | PRN
  Administered 2019-02-28: 5 mg via ORAL
  Filled 2019-02-28: qty 1

## 2019-02-28 MED ORDER — INSULIN DETEMIR 100 UNIT/ML ~~LOC~~ SOLN
16.0000 [IU] | Freq: Two times a day (BID) | SUBCUTANEOUS | Status: DC
  Administered 2019-02-28 – 2019-03-01 (×2): 16 [IU] via SUBCUTANEOUS
  Filled 2019-02-28 (×2): qty 0.16

## 2019-02-28 MED ORDER — INSULIN ASPART 100 UNIT/ML ~~LOC~~ SOLN
6.0000 [IU] | Freq: Three times a day (TID) | SUBCUTANEOUS | Status: DC
  Administered 2019-02-28 – 2019-03-01 (×2): 6 [IU] via SUBCUTANEOUS

## 2019-02-28 NOTE — Progress Notes (Signed)
Inpatient Diabetes Program Recommendations  AACE/ADA: New Consensus Statement on Inpatient Glycemic Control (2015)  Target Ranges:  Prepandial:   less than 140 mg/dL      Peak postprandial:   less than 180 mg/dL (1-2 hours)      Critically ill patients:  140 - 180 mg/dL    Results for [name], [name] (MRN [id number]) as of 02/27/2019 07:59  Ref. Range 02/26/2019 07:54 02/26/2019 11:54 02/26/2019 15:15 02/26/2019 21:13  Glucose-Capillary Latest Ref Range: 70 - 99 mg/dL 140 (H)  6 units NOVOLOG +  10 units LEVEMIR  203 (H)  10 units NOVOLOG  310 (H)  18 units NOVOLOG  201 (H)  2 units NOVOLOG +  10 units LEVEMIR     Results for [name], [name] (MRN [id number]) as of 02/28/2019 09:41  Ref. Range 02/27/2019 07:37 02/27/2019 11:29 02/27/2019 16:48 02/27/2019 22:08 02/28/2019 07:43  Glucose-Capillary Latest Ref Range: 70 - 99 mg/dL 111 (H) 183 (H)  Solumedrol started 300 (H) 295 (H) 262 (H)    Home DM Meds: None--Diet Controlled  Current Orders: Levemir 10 units BID      Novolog Resistant Correction Scale/ SSI (0-20 units) TID AC + HS      Novolog 3 units TID with meals    MD- Fasting CBG 262 this am after Decadron switched to IV Solumedrol 60 mg Q8 hours for worsening sob and transfer to ICU.  Consider increasing Levemir to 16 units bid  Consider increasing Novolog Meal Coverage to  Novolog 6 units TID while patient remains on steroids and eating.     --Will follow patient during hospitalization--  Tama Headings RN, MSN, BC-ADM Inpatient Diabetes Coordinator Team Pager [phone] (8a-5p)

## 2019-02-28 NOTE — Progress Notes (Signed)
NAME:  [name], MRN:  [id number], DOB:  07-09-1946, LOS: 4 ADMISSION DATE:  02/24/2019, CONSULTATION DATE: 8/4 REFERRING MD: Dr. Loleta Books, CHIEF COMPLAINT: Dyspnea  Brief History   73 year old female with multiple medical problems admitted on 8/2 in the setting of severe acute respiratory failure with hypoxemia due to COVID-19 pneumonia.  Moved to the intensive care unit on 8/4 due to worsening hypoxemia.   Past Medical History  Hypertension Asthma History of breast cancer Coronary artery disease Nocturnal oxygen, 2 L at night Diabetes mellitus Gastroesophageal reflux disease Hypertension Hyperlipidemia Obstructive sleep apnea  Significant Hospital Events   August 2 admission August 4 transfer to intensive care unit for heated high flow  Consults:  Pulmonary and critical care medicine  Procedures:    Significant Diagnostic Tests:    Micro Data:  8/2 SARS COV 2 > positive  Antimicrobials:  August 2 doxycycline August 2 hydroxychloroquine ? Home med? August 2 remdesivir August 2 Actemra August 2 decadron  Interim history/subjective:   Feels better Slept fairly well Voice stronger today  Objective   Blood pressure 114/79, pulse 67, temperature 98.8 F (37.1 C), temperature source Oral, resp. rate (!) 23, height 5\' 4"  (1.626 m), weight 102.9 kg, SpO2 91 %.    FiO2 (%):  [80 %-100 %] 100 %   Intake/Output Summary (Last 24 hours) at 02/28/2019 0727 Last data filed at 02/28/2019 0600 Gross per 24 hour  Intake 890 ml  Output 1150 ml  Net -260 ml   Filed Weights   02/24/19 2322 02/24/19 2335 02/27/19 0400  Weight: 110 kg 102.8 kg 102.9 kg    Examination:  General:  Resting comfortably in bed HENT: NCAT OP clear PULM: Crackles bases B, normal effort CV: RRR, no mgr GI: BS+, soft, nontender MSK: normal bulk and tone Neuro: awake, alert, no distress, MAEW  August 3 chest x-ray images independently reviewed showing left greater than right airspace  disease in the bases  Resolved Hospital Problem list     Assessment & Plan:  Severe acute respiratory failure with hypoxemia secondary to COVID-19 pneumonia. At risk for ventilatory failure which currently showing no signs of that 8/6 seems to be a little stronger Maintain level of care in ICU for close monitoring Attempt to wean heated high flow oxygen to [place] high flow cannula Complete remdesivir today Continue decadron Tolerate periods of hypoxemia, goal at rest is greater than 85% SaO2, with movement ideally above 75% Decision for intubation should be based on a change in mental status or physical evidence of ventilatory failure such as nasal flaring, accessory muscle use, paradoxical breathing Out of bed to chair as able Incentive spirometry is important, use every hour Prone positioning while in bed  Asthma  Albuterol prn Monitor for wheezing   Best practice:  Diet: regular Pain/Anxiety/Delirium protocol (if indicated): n/a VAP protocol (if indicated): n/a DVT prophylaxis: lovenox bid GI prophylaxis: n/a Glucose control: per TRH Mobility:  Out of bed, ROM exercises Code Status: full Family Communication: will discuss with TRH Disposition: remain in ICU  Labs   CBC: Recent Labs  Lab 02/24/19 1630 02/26/19 0250 02/27/19 0612  WBC 8.9 10.0 7.6  NEUTROABS 7.4 7.5 5.3  HGB 15.3* 14.6 15.3*  HCT 46.3* 44.7 47.0*  MCV 91.1 91.8 91.6  PLT 199 286 542    Basic Metabolic Panel: Recent Labs  Lab 02/24/19 1630 02/26/19 0250 02/27/19 0612  NA 136 136 138  K 3.9 3.6 3.9  CL 97* 99 105  CO2 22 24 24   GLUCOSE 302* 184* 135*  BUN 18 20 24*  CREATININE 0.55 0.52 0.63  CALCIUM 9.0 9.2 8.8*   GFR: Estimated Creatinine Clearance: 72.1 mL/min (by C-G formula based on SCr of 0.63 mg/dL). Recent Labs  Lab 02/24/19 1630 02/26/19 0250 02/27/19 0612  PROCALCITON <0.10  --   --   WBC 8.9 10.0 7.6  LATICACIDVEN 1.7  --   --     Liver Function Tests: Recent  Labs  Lab 02/24/19 1630 02/26/19 0250 02/27/19 0612  AST 30 27 47*  ALT 24 22 37  ALKPHOS 54 51 49  BILITOT 0.5 0.5 0.8  PROT 8.0 7.7 7.1  ALBUMIN 3.7 3.4* 3.3*   No results for input(s): LIPASE, AMYLASE in the last 168 hours. No results for input(s): AMMONIA in the last 168 hours.  ABG No results found for: PHART, PCO2ART, PO2ART, HCO3, TCO2, ACIDBASEDEF, O2SAT   Coagulation Profile: No results for input(s): INR, PROTIME in the last 168 hours.  Cardiac Enzymes: No results for input(s): CKTOTAL, CKMB, CKMBINDEX, TROPONINI in the last 168 hours.  HbA1C: Hgb A1c MFr Bld  Date/Time Value Ref Range Status  02/26/2019 02:50 AM 8.1 (H) 4.8 - 5.6 % Final    Comment:    (NOTE) Pre diabetes:          5.7%-6.4% Diabetes:              >6.4% Glycemic control for   <7.0% adults with diabetes   09/03/2015 11:08 AM 6.2 (H) 4.8 - 5.6 % Final    Comment:    (NOTE)         Pre-diabetes: 5.7 - 6.4         Diabetes: >6.4         Glycemic control for adults with diabetes: <7.0     CBG: Recent Labs  Lab 02/26/19 2113 02/27/19 0737 02/27/19 1129 02/27/19 1648 02/27/19 2208  GLUCAP 201* 111* 183* 300* 295*     Critical care time: 30 minutes     Roselie Awkward, MD [facility] Pager: [phone] Cell: [phone] If no response, call [phone]

## 2019-02-28 NOTE — Progress Notes (Addendum)
PROGRESS NOTE    COMFORT IVERSEN  EGB:[id number] DOB: [birth year] DOA: 02/24/2019 PCP: Crist Infante, MD   Brief Narrative:  73 y.o. WF PMHx depression, asthma on 2 L of oxygen OSA, breast cancer s/p chemotherapy+ XRT chronic back pain, diabetes type 2 controlled with complication (diabetic peripheral neuropathy,, HLD, HTN, CAD, RA, OA, IBS  comes in with chief complaint of shortness of breath. She also has history of CAD, diabetes.Patient states that on Monday she started having some nasal congestion. She followed up with her PCP and had a test for COVID-19 that day which came back positive on Wednesday. Over the last couple of days she has had increased shortness of breath and decided to come to the ER.  According to EMS patient's O2 sats were in the low 80s when they arrived. She is currently on 15 L/nonrebreather.She is having shortness of breath with exertion and feeling of dizziness. She has a mild cough. Patient is also having subjective low-grade temps and weakness.  Pt has no hx of PE, DVT and denies any exogenous hormone (testosterone / estrogen) use, long distance travels or surgery in the past 6 weeks, active cancer, recent immobilization.note).  ED Course: In the emergency department patient had routine labs drawn which revealed elevation in inflammatory markers.  He was put to 15 L high flow nasal cannula improvement of her oxygen saturations to greater than 90%.  X-ray was performed which revealed a 19 pneumonia type changes.  She did complain of a sniffing and headache and she was given dexamethasone 10 mg IV along with a headache cocktail.  Patient referred for admission to [facility] to a progressive bed due to need for high flow nasal cannula oxygen.  Review of Systems: As per HPI otherwise 10 point review of systems negative. She does admit to cough that is non-productive, endorses nauses, light-headedness, continued headache.   Subjective: 8/6 A/O x4, positive S OB,  positive DOE.  Breathing appears much easier i.e. less use of accessory muscles.  Negative N/V   Assessment & Plan:   Active Problems:   Coronary artery disease   Hypertension   Diabetes mellitus, type II (HCC)   OSA (obstructive sleep apnea)   Pneumonia due to COVID-19 virus   Breast cancer (HCC)   History of cancer chemotherapy   History of radiation therapy   Diabetic neuropathy (HCC)   HLD (hyperlipidemia)   Essential hypertension   QT prolongation   Acute on chronic respiratory failure with hypoxia (HCC)   OSA on CPAP   Diabetes mellitus type 2, uncontrolled, with complications (HCC)  Acute on chronic respiratory failure with hypoxia/COVID 19 pneumonia -Patient normally on 2 L O2 via [place] at home - Solu-Medrol 60 mg TID -Remdesivir per pharmacy protocol - Actemra - Albuterol twice daily (patient has prolonged QT interval want to be cautious) - Court twice daily - Vitamins per COVID protocol - Daily COVID inflammatory markers pending Recent Labs  Lab 02/24/19 1630 02/28/19 0700  CRP 10.7* <0.8   Recent Labs  Lab 02/24/19 1630 02/28/19 0700  DDIMER 0.46 1.66*    Asthma -See see acute on chronic respiratory failure  OSA - Currently on HHFNC  Diabetes type 2 uncontrolled with complication -8/4 hemoglobin A1c= 8.1 - Lipid panel pending - 8/6 increase Levemir 16 units BID - 8/6 increase NovoLog 6 units QAC  - Resistant SSI  Essential HTN -Amlodipine 5 mg daily - Eva Sartain 300 mg daily -HCTZ 20 mg daily - Labetalol PRN  RA -  Hold all NSAIDs - Baclofen 10 mg twice daily - Cymbalta 60 mg daily - Gabapentin 600 mg 3 times daily - Plaquenil 200 mg twice daily  QT prolongation -8/2 EKG QTC prolongation resolved.  Continue to monitor closely -8/7 EKG QTC prolongation    DVT prophylaxis: Lovenox Code Status: Full Family Communication: Spoke with Ms. Laretta Bolster and brought her up-to-date on the plan of care for her mother answered all questions  Disposition Plan: TBD   Consultants:  PCCM    Procedures/Significant Events:     I have personally reviewed and interpreted all radiology studies and my findings are as above.  VENTILATOR SETTINGS: HHFNC Heater temp: 31.2 C O2: 25 l/min FiO2: 80% SPO2 92%    Cultures   Antimicrobials: Anti-infectives (From admission, onward)   Start     Stop   02/25/19 2200  remdesivir 100 mg in sodium chloride 0.9 % 250 mL IVPB     03/01/19 2159   02/25/19 1000  doxycycline (VIBRA-TABS) tablet 100 mg  Status:  Discontinued     02/25/19 0728   02/25/19 0100  remdesivir 200 mg in sodium chloride 0.9 % 250 mL IVPB     02/25/19 0149   02/24/19 2330  hydroxychloroquine (PLAQUENIL) tablet 200 mg         02/24/19 2200  doxycycline (VIBRA-TABS) tablet 100 mg  Status:  Discontinued     02/24/19 2344       Devices    LINES / TUBES:      Continuous Infusions: . remdesivir 100 mg in NS 250 mL 100 mg (02/27/19 2042)     Objective: Vitals:   02/28/19 1100 02/28/19 1149 02/28/19 1200 02/28/19 1300  BP: (!) 116/55 (!) 116/55 111/66 106/82  Pulse: 69 71 71 71  Resp: 15 (!) 25 20 (!) 25  Temp:   97.6 F (36.4 C)   TempSrc:   Oral   SpO2: (!) 82% 92% 90% 93%  Weight:      Height:        Intake/Output Summary (Last 24 hours) at 02/28/2019 1457 Last data filed at 02/28/2019 0600 Gross per 24 hour  Intake 890 ml  Output 900 ml  Net -10 ml   Filed Weights   02/24/19 2322 02/24/19 2335 02/27/19 0400  Weight: 110 kg 102.8 kg 102.9 kg   Physical Exam:  General: A/O x4, positive acute on chronic respiratory distress Eyes: negative scleral hemorrhage, negative anisocoria, negative icterus ENT: Negative Runny nose, negative gingival bleeding, Neck:  Negative scars, masses, torticollis, lymphadenopathy, JVD Lungs: Tachypneic, clear to auscultation bilaterally without wheezes or crackles Cardiovascular: Tachycardic, without murmur gallop or rub normal S1 and S2 Abdomen:  negative abdominal pain, nondistended, positive soft, bowel sounds, no rebound, no ascites, no appreciable mass Extremities: No significant cyanosis, clubbing, or edema bilateral lower extremities Skin: Negative rashes, lesions, ulcers Psychiatric:  Negative depression, negative anxiety, negative fatigue, negative mania  Central nervous system:  Cranial nerves II through XII intact, tongue/uvula midline, all extremities muscle strength 5/5, sensation intact throughout,negative dysarthria, negative expressive aphasia, negative receptive aphasia.       Data Reviewed: Care during the described time interval was provided by me .  I have reviewed this patient's available data, including medical history, events of note, physical examination, and all test results as part of my evaluation.   CBC: Recent Labs  Lab 02/24/19 1630 02/26/19 0250 02/27/19 0612 02/28/19 0700  WBC 8.9 10.0 7.6 10.9*  NEUTROABS 7.4 7.5 5.3 9.3*  HGB  15.3* 14.6 15.3* 14.6  HCT 46.3* 44.7 47.0* 44.3  MCV 91.1 91.8 91.6 90.0  PLT 199 286 250 703   Basic Metabolic Panel: Recent Labs  Lab 02/24/19 1630 02/26/19 0250 02/27/19 0612 02/28/19 0700  NA 136 136 138 132*  K 3.9 3.6 3.9 4.3  CL 97* 99 105 100  CO2 22 24 24 22   GLUCOSE 302* 184* 135* 283*  BUN 18 20 24* 21  CREATININE 0.55 0.52 0.63 0.57  CALCIUM 9.0 9.2 8.8* 8.5*  MG  --   --   --  2.1   GFR: Estimated Creatinine Clearance: 72.1 mL/min (by C-G formula based on SCr of 0.57 mg/dL). Liver Function Tests: Recent Labs  Lab 02/24/19 1630 02/26/19 0250 02/27/19 0612 02/28/19 0700  AST 30 27 47* 30  ALT 24 22 37 41  ALKPHOS 54 51 49 50  BILITOT 0.5 0.5 0.8 0.6  PROT 8.0 7.7 7.1 6.8  ALBUMIN 3.7 3.4* 3.3* 3.1*   No results for input(s): LIPASE, AMYLASE in the last 168 hours. No results for input(s): AMMONIA in the last 168 hours. Coagulation Profile: No results for input(s): INR, PROTIME in the last 168 hours. Cardiac Enzymes: No results for  input(s): CKTOTAL, CKMB, CKMBINDEX, TROPONINI in the last 168 hours. BNP (last 3 results) No results for input(s): PROBNP in the last 8760 hours. HbA1C: Recent Labs    02/26/19 0250  HGBA1C 8.1*   CBG: Recent Labs  Lab 02/27/19 1129 02/27/19 1648 02/27/19 2208 02/28/19 0743 02/28/19 1218  GLUCAP 183* 300* 295* 262* 276*   Lipid Profile: Recent Labs    02/28/19 0700  CHOL 202*  HDL 36*  LDLCALC 142*  TRIG 122  CHOLHDL 5.6   Thyroid Function Tests: No results for input(s): TSH, T4TOTAL, FREET4, T3FREE, THYROIDAB in the last 72 hours. Anemia Panel: No results for input(s): VITAMINB12, FOLATE, FERRITIN, TIBC, IRON, RETICCTPCT in the last 72 hours. Urine analysis:    Component Value Date/Time   COLORURINE YELLOW 04/08/2011 0950   APPEARANCEUR CLOUDY (A) 04/08/2011 0950   LABSPEC 1.034 (H) 04/08/2011 0950   PHURINE 6.0 04/08/2011 0950   GLUCOSEU NEGATIVE 04/08/2011 0950   HGBUR NEGATIVE 04/08/2011 0950   BILIRUBINUR NEGATIVE 04/08/2011 0950   KETONESUR NEGATIVE 04/08/2011 0950   PROTEINUR NEGATIVE 04/08/2011 0950   UROBILINOGEN 0.2 04/08/2011 0950   NITRITE NEGATIVE 04/08/2011 0950   LEUKOCYTESUR NEGATIVE 04/08/2011 0950   Sepsis Labs: @LABRCNTIP (procalcitonin:4,lacticidven:4)  ) Recent Results (from the past 240 hour(s))  SARS Coronavirus 2 [facility] order, Performed in [facility] hospital lab) Nasopharyngeal Nasopharyngeal Swab     Status: Abnormal   Collection Time: 02/24/19  4:30 PM   Specimen: Nasopharyngeal Swab  Result Value Ref Range Status   SARS Coronavirus 2 POSITIVE (A) NEGATIVE Final    Comment: RESULT CALLED TO, READ BACK BY AND VERIFIED WITH: HODGES,C RN @2003  ON 02/24/2019 JACKSON,K (NOTE) If result is NEGATIVE SARS-CoV-2 target nucleic acids are NOT DETECTED. The SARS-CoV-2 RNA is generally detectable in upper and lower  respiratory specimens during the acute phase of infection. The lowest  concentration of SARS-CoV-2 viral copies this assay  can detect is 250  copies / mL. A negative result does not preclude SARS-CoV-2 infection  and should not be used as the sole basis for treatment or other  patient management decisions.  A negative result may occur with  improper specimen collection / handling, submission of specimen other  than nasopharyngeal swab, presence of viral mutation(s) within the  areas targeted  by this assay, and inadequate number of viral copies  (<250 copies / mL). A negative result must be combined with clinical  observations, patient history, and epidemiological information. If result is POSITIVE SARS-CoV-2 target nucleic acids are DETECTED . The SARS-CoV-2 RNA is generally detectable in upper and lower  respiratory specimens during the acute phase of infection.  Positive  results are indicative of active infection with SARS-CoV-2.  Clinical  correlation with patient history and other diagnostic information is  necessary to determine patient infection status.  Positive results do  not rule out bacterial infection or co-infection with other viruses. If result is PRESUMPTIVE POSTIVE SARS-CoV-2 nucleic acids MAY BE PRESENT.   A presumptive positive result was obtained on the submitted specimen  and confirmed on repeat testing.  While 2019 novel coronavirus  (SARS-CoV-2) nucleic acids may be present in the submitted sample  additional confirmatory testing may be necessary for epidemiological  and / or clinical management purposes  to differentiate between  SARS-CoV-2 and other Sarbecovirus currently known to infect humans.  If clinically indicated additional testing with an alternate test  methodology [phone] ) is advised. The SARS-CoV-2 RNA is generally  detectable in upper and lower respiratory specimens during the acute  phase of infection. The expected result is Negative. Fact Sheet for Patients:  StrictlyIdeas.no Fact Sheet for Healthcare Providers:  BankingDealers.co.za This test is not yet approved or cleared by the [place] FDA and has been authorized for detection and/or diagnosis of SARS-CoV-2 by FDA under an Emergency Use Authorization (EUA).  This EUA will remain in effect (meaning this test can be used) for the duration of the COVID-19 declaration under Section 564(b)(1) of the Act, 21 U.S.C. section 360bbb-3(b)(1), unless the authorization is terminated or revoked sooner. Performed at [facility], [place] [address] [place], [place] [address]   MRSA PCR Screening     Status: None   Collection Time: 02/27/19  8:17 AM   Specimen: Nasal Mucosa; Nasopharyngeal  Result Value Ref Range Status   MRSA by PCR NEGATIVE NEGATIVE Final    Comment:        The GeneXpert MRSA Assay (FDA approved for NASAL specimens only), is one component of a comprehensive MRSA colonization surveillance program. It is not intended to diagnose MRSA infection nor to guide or monitor treatment for MRSA infections. Performed at [facility], [place] [address] [place], [place] [address]          Radiology Studies: No results found.      Scheduled Meds: . albuterol  2 puff Inhalation BID  . amLODipine  5 mg Oral Daily  . aspirin  81 mg Oral QHS  . baclofen  10 mg Oral BID  . budesonide  1 puff Inhalation BID  . buPROPion  150 mg Oral Daily  . busPIRone  7.5 mg Oral BID  . Chlorhexidine Gluconate Cloth  6 each Topical Q0600  . DULoxetine  60 mg Oral Daily  . enoxaparin (LOVENOX) injection  0.5 mg/kg Subcutaneous BID  . fluticasone  2 spray Each Nare BID  . folic acid  2 mg Oral Daily  . gabapentin  600 mg Oral TID  . galantamine  16 mg Oral Q breakfast  . irbesartan  300 mg Oral Daily   And  . hydrochlorothiazide  25 mg Oral Daily  . hydroxychloroquine  200 mg Oral BID  . insulin aspart  0-20 Units Subcutaneous TID WC  . insulin aspart  0-5 Units Subcutaneous QHS  .  insulin aspart  6 Units Subcutaneous TID WC  . insulin detemir  16 Units Subcutaneous BID  . methylPREDNISolone (SOLU-MEDROL) injection  60 mg Intravenous Q8H  . nebivolol  10 mg Oral Daily  . nystatin  5 mL Oral QID  . pantoprazole  40 mg Oral Daily  . potassium chloride  20 mEq Oral Daily  . senna  1 tablet Oral BID  . sodium chloride flush  3 mL Intravenous Q12H  . triamcinolone cream   Topical BID  . vitamin C  500 mg Oral Daily  . zinc sulfate  220 mg Oral Daily   Continuous Infusions: . remdesivir 100 mg in NS 250 mL 100 mg (02/27/19 2042)     LOS: 4 days   The patient is critically ill with multiple organ systems failure and requires high complexity decision making for assessment and support, frequent evaluation and titration of therapies, application of advanced monitoring technologies and extensive interpretation of multiple databases. Critical Care Time devoted to patient care services described in this note  Time spent: 40 minutes     WOODS, Geraldo Docker, MD [facility] Pager [phone]  If 7PM-7AM, please contact night-coverage [email or web address] Password [email or web address] 02/28/2019, 2:57 PM

## 2019-02-28 NOTE — Progress Notes (Signed)
Patients daughter updated via telephone.  

## 2019-03-01 DIAGNOSIS — E0843 Diabetes mellitus due to underlying condition with diabetic autonomic (poly)neuropathy: Secondary | ICD-10-CM

## 2019-03-01 DIAGNOSIS — J45909 Unspecified asthma, uncomplicated: Secondary | ICD-10-CM

## 2019-03-01 LAB — COMPREHENSIVE METABOLIC PANEL
ALT: 36 U/L (ref 0–44)
AST: 25 U/L (ref 15–41)
Albumin: 3.2 g/dL — ABNORMAL LOW (ref 3.5–5.0)
Alkaline Phosphatase: 46 U/L (ref 38–126)
Anion gap: 10 (ref 5–15)
BUN: 22 mg/dL (ref 8–23)
CO2: 22 mmol/L (ref 22–32)
Calcium: 8.6 mg/dL — ABNORMAL LOW (ref 8.9–10.3)
Chloride: 101 mmol/L (ref 98–111)
Creatinine, Ser: 0.6 mg/dL (ref 0.44–1.00)
GFR calc Af Amer: >60 mL/min (ref >60)
GFR calc non Af Amer: >60 mL/min (ref >60)
Glucose, Bld: 232 mg/dL — ABNORMAL HIGH (ref 70–99)
Potassium: 4.3 mmol/L (ref 3.5–5.1)
Sodium: 133 mmol/L — ABNORMAL LOW (ref 135–145)
Total Bilirubin: 0.7 mg/dL (ref 0.3–1.2)
Total Protein: 6.7 g/dL (ref 6.5–8.1)

## 2019-03-01 LAB — CBC WITH DIFFERENTIAL/PLATELET
Abs Immature Granulocytes: 0.44 10*3/uL — ABNORMAL HIGH (ref 0.00–0.07)
Basophils Absolute: 0.1 10*3/uL (ref 0.0–0.1)
Basophils Relative: 0 %
Eosinophils Absolute: 0 10*3/uL (ref 0.0–0.5)
Eosinophils Relative: 0 %
HCT: 43.7 % (ref 36.0–46.0)
Hemoglobin: 14.4 g/dL (ref 12.0–15.0)
Immature Granulocytes: 3 %
Lymphocytes Relative: 7 %
Lymphs Abs: 1 10*3/uL (ref 0.7–4.0)
MCH: 29.8 pg (ref 26.0–34.0)
MCHC: 33 g/dL (ref 30.0–36.0)
MCV: 90.3 fL (ref 80.0–100.0)
Monocytes Absolute: 0.6 10*3/uL (ref 0.1–1.0)
Monocytes Relative: 4 %
Neutro Abs: 12.7 10*3/uL — ABNORMAL HIGH (ref 1.7–7.7)
Neutrophils Relative %: 86 %
Platelets: 227 10*3/uL (ref 150–400)
RBC: 4.84 MIL/uL (ref 3.87–5.11)
RDW: 12.6 % (ref 11.5–15.5)
WBC: 14.7 10*3/uL — ABNORMAL HIGH (ref 4.0–10.5)
nRBC: 0 % (ref 0.0–0.2)

## 2019-03-01 LAB — GLUCOSE, CAPILLARY
Glucose-Capillary: 262 mg/dL — ABNORMAL HIGH (ref 70–99)
Glucose-Capillary: 264 mg/dL — ABNORMAL HIGH (ref 70–99)
Glucose-Capillary: 273 mg/dL — ABNORMAL HIGH (ref 70–99)
Glucose-Capillary: 321 mg/dL — ABNORMAL HIGH (ref 70–99)

## 2019-03-01 LAB — C-REACTIVE PROTEIN: CRP: <0.8 mg/dL (ref <1.0)

## 2019-03-01 LAB — MAGNESIUM: Magnesium: 2 mg/dL (ref 1.7–2.4)

## 2019-03-01 LAB — D-DIMER, QUANTITATIVE: D-Dimer, Quant: 1.4 ug/mL-FEU — ABNORMAL HIGH (ref 0.00–0.50)

## 2019-03-01 MED ORDER — LOPERAMIDE HCL 2 MG PO CAPS
2.0000 mg | ORAL_CAPSULE | ORAL | Status: DC | PRN
  Administered 2019-03-01 – 2019-03-07 (×6): 2 mg via ORAL
  Filled 2019-03-01 (×6): qty 1

## 2019-03-01 MED ORDER — METHYLPREDNISOLONE SODIUM SUCC 125 MG IJ SOLR
60.0000 mg | Freq: Every day | INTRAMUSCULAR | Status: DC
  Administered 2019-03-02: 60 mg via INTRAVENOUS
  Filled 2019-03-01: qty 2

## 2019-03-01 MED ORDER — SODIUM CHLORIDE 0.9% FLUSH: 10.0000 mL | INTRAVENOUS | Status: DC | PRN

## 2019-03-01 MED ORDER — INSULIN DETEMIR 100 UNIT/ML ~~LOC~~ SOLN
20.0000 [IU] | Freq: Two times a day (BID) | SUBCUTANEOUS | Status: DC
  Administered 2019-03-01 – 2019-03-02 (×2): 20 [IU] via SUBCUTANEOUS
  Filled 2019-03-01 (×3): qty 0.2

## 2019-03-01 MED ORDER — FUROSEMIDE 40 MG PO TABS
20.0000 mg | ORAL_TABLET | Freq: Once | ORAL | Status: AC
  Administered 2019-03-01: 20 mg via ORAL
  Filled 2019-03-01: qty 1

## 2019-03-01 MED ORDER — INSULIN ASPART 100 UNIT/ML ~~LOC~~ SOLN
10.0000 [IU] | Freq: Three times a day (TID) | SUBCUTANEOUS | Status: DC
  Administered 2019-03-01 – 2019-03-02 (×3): 10 [IU] via SUBCUTANEOUS

## 2019-03-01 MED ORDER — SODIUM CHLORIDE 0.9% FLUSH
10.0000 mL | Freq: Two times a day (BID) | INTRAVENOUS | Status: DC
  Administered 2019-03-01 – 2019-03-07 (×12): 10 mL

## 2019-03-01 MED ORDER — EZETIMIBE 10 MG PO TABS
10.0000 mg | ORAL_TABLET | Freq: Every day | ORAL | Status: DC
  Administered 2019-03-01 – 2019-03-07 (×7): 10 mg via ORAL
  Filled 2019-03-01 (×8): qty 1

## 2019-03-01 NOTE — Progress Notes (Signed)
Inpatient Diabetes Program Recommendations  AACE/ADA: New Consensus Statement on Inpatient Glycemic Control (2015)  Target Ranges:  Prepandial:   less than 140 mg/dL      Peak postprandial:   less than 180 mg/dL (1-2 hours)      Critically ill patients:  140 - 180 mg/dL    Results for JANNELLY, BERGREN (MRN [id number]) as of 03/01/2019 09:27  Ref. Range 02/28/2019 07:43 02/28/2019 12:18 02/28/2019 17:09 02/28/2019 22:17 03/01/2019 07:52  Glucose-Capillary Latest Ref Range: 70 - 99 mg/dL 262 (H)  Novolog 11 units + 3 meal coverage  Levemir 10 units  Solumedrol 60 mg 276 (H)  Novolog 11 units + 3 meal coverage     Solumedrol 60 mg 260 (H)  Novolog 11 units + 6 meal coverage  Levemir 16 units 257 (H)  Novolog 3 units       Solumedrol 60 mg 262 (H)  Novolog 11 units + 6 meal coverage  Levemir 16 units    Home DM Meds: None--Diet Controlled  Current Orders: Levemir 16 units BID      Novolog Resistant Correction Scale/ SSI (0-20 units) TID AC + HS      Novolog 6 units TID with meals    MD- Fasting CBG 262 this am. Increasing in Levemir to 16 units bid to get second dose this am.   If glucose trends continue to be elevated at lunchtime consider increasing Levemir to 20 units bid.   --Will follow patient during hospitalization--  Tama Headings RN, MSN, BC-ADM Inpatient Diabetes Coordinator Team Pager [phone] (8a-5p)

## 2019-03-01 NOTE — Progress Notes (Addendum)
PROGRESS NOTE    [name]  MKL:[id number] DOB: Dec 08, 1945 DOA: 02/24/2019 PCP: Crist Infante, MD   Brief Narrative:  73 y.o. WF PMHx depression, asthma on 2 L of oxygen OSA, breast cancer s/p chemotherapy+ XRT chronic back pain, diabetes type 2 controlled with complication (diabetic peripheral neuropathy,, HLD, HTN, CAD, RA, OA, IBS  comes in with chief complaint of shortness of breath. She also has history of CAD, diabetes.Patient states that on Monday she started having some nasal congestion. She followed up with her PCP and had a test for COVID-19 that day which came back positive on Wednesday. Over the last couple of days she has had increased shortness of breath and decided to come to the ER.  According to EMS patient's O2 sats were in the low 80s when they arrived. She is currently on 15 L/nonrebreather.She is having shortness of breath with exertion and feeling of dizziness. She has a mild cough. Patient is also having subjective low-grade temps and weakness.  Pt has no hx of PE, DVT and denies any exogenous hormone (testosterone / estrogen) use, long distance travels or surgery in the past 6 weeks, active cancer, recent immobilization.note).  ED Course: In the emergency department patient had routine labs drawn which revealed elevation in inflammatory markers.  He was put to 15 L high flow nasal cannula improvement of her oxygen saturations to greater than 90%.  X-ray was performed which revealed a 19 pneumonia type changes.  She did complain of a sniffing and headache and she was given dexamethasone 10 mg IV along with a headache cocktail.  Patient referred for admission to [facility] to a progressive bed due to need for high flow nasal cannula oxygen.  Review of Systems: As per HPI otherwise 10 point review of systems negative. She does admit to cough that is non-productive, endorses nauses, light-headedness, continued headache.   Subjective: 8/7 A/O x4, positive S OB,  sitting in chair a lot more comfortable than on 8/6.  Negative N/V.  Assessment & Plan:   Active Problems:   Coronary artery disease   Hypertension   Diabetes mellitus, type II (HCC)   OSA (obstructive sleep apnea)   Pneumonia due to COVID-19 virus   Breast cancer (HCC)   History of cancer chemotherapy   History of radiation therapy   Diabetic neuropathy (HCC)   HLD (hyperlipidemia)   Essential hypertension   QT prolongation   Acute on chronic respiratory failure with hypoxia (HCC)   OSA on CPAP   Diabetes mellitus type 2, uncontrolled, with complications (HCC)  Acute on chronic respiratory failure with hypoxia/COVID 19 pneumonia -Patient normally on 2 L O2 via [place] at home - Solu-Medrol 60 mg TID -Remdesivir per pharmacy protocol - Actemra - Albuterol twice daily (patient has prolonged QT interval want to be cautious) - Pulmicort BID - Vitamins per COVID protocol - Daily COVID inflammatory markers pending Recent Labs  Lab 02/24/19 1630 02/28/19 0700 03/01/19 0047  CRP 10.7* <0.8 <0.8   Recent Labs  Lab 02/24/19 1630 02/28/19 0700 03/01/19 0047  DDIMER 0.46 1.66* 1.40*  - Improving O2 status now on HF [place]@12  L/min with SPO2 87%.  Depending upon patient's ability to wean in the next 24 to 48 hours should be able to move her to the floor  Asthma -See see acute on chronic respiratory failure  OSA - See acute on chronic respiratory failure  Diabetes type 2 uncontrolled with complication -8/4 hemoglobin A1c= 8.1 - Lipid panel pending - 8/7  increase Levemir 20 units BID  - 8/7 increase NovoLog 10 units QAC    - Resistant SSI  HLD - 8/6 lipid panel not within a AHA/ADA guidelines - LDL goal<70 - Patient with allergy to statins. - 8/7 start Zetia 10 mg daily  Essential HTN -Amlodipine 5 mg daily - Irbesartan 300 mg daily -HCTZ 20 mg daily - Labetalol PRN  RA - Hold all NSAIDs - Baclofen 10 mg twice daily - Cymbalta 60 mg daily - Gabapentin 600 mg 3 times  daily - Plaquenil 200 mg twice daily  QT prolongation -8/2 EKG QTC prolongation resolved.  Continue to monitor closely -8/8 EKG QTC prolongation pending     DVT prophylaxis: Lovenox Code Status: Full Family Communication: Spoke with Laretta Bolster (daughter), went over plan of care concerning her mother answered all questions Disposition Plan: TBD   Consultants:  PCCM    Procedures/Significant Events:     I have personally reviewed and interpreted all radiology studies and my findings are as above.  VENTILATOR SETTINGS: HHFNC Heater temp: 31.2 C O2: 25 l/min FiO2: 80% SPO2 92%    Cultures   Antimicrobials: Anti-infectives (From admission, onward)   Start     Stop   02/25/19 2200  remdesivir 100 mg in sodium chloride 0.9 % 250 mL IVPB     03/01/19 2159   02/25/19 1000  doxycycline (VIBRA-TABS) tablet 100 mg  Status:  Discontinued     02/25/19 0728   02/25/19 0100  remdesivir 200 mg in sodium chloride 0.9 % 250 mL IVPB     02/25/19 0149   02/24/19 2330  hydroxychloroquine (PLAQUENIL) tablet 200 mg         02/24/19 2200  doxycycline (VIBRA-TABS) tablet 100 mg  Status:  Discontinued     02/24/19 2344       Devices    LINES / TUBES:      Continuous Infusions:    Objective: Vitals:   03/01/19 0000 03/01/19 0258 03/01/19 0400 03/01/19 0600  BP: 122/62  124/63 137/72  Pulse: 60 62 62 74  Resp: 19 19 19 19   Temp: (!) 97.1 F (36.2 C)  98.6 F (37 C)   TempSrc: Axillary  Axillary   SpO2: 96% 92% (!) 89% 92%  Weight:      Height:        Intake/Output Summary (Last 24 hours) at 03/01/2019 2094 Last data filed at 03/01/2019 7096 Gross per 24 hour  Intake 694 ml  Output 450 ml  Net 244 ml   Filed Weights   02/24/19 2322 02/24/19 2335 02/27/19 0400  Weight: 110 kg 102.8 kg 102.9 kg   Physical Exam:  General: A/O x4, positive acute on chronic respiratory distress Eyes: negative scleral hemorrhage, negative anisocoria, negative icterus ENT:  Negative Runny nose, negative gingival bleeding, Neck:  Negative scars, masses, torticollis, lymphadenopathy, JVD Lungs: Clear to auscultation bilaterally without wheezes or crackles Cardiovascular: Regular rate and rhythm without murmur gallop or rub normal S1 and S2 Abdomen: negative abdominal pain, nondistended, positive soft, bowel sounds, no rebound, no ascites, no appreciable mass Extremities: No significant cyanosis, clubbing, or edema bilateral lower extremities Skin: Negative rashes, lesions, ulcers Psychiatric:  Negative depression, negative anxiety, negative fatigue, negative mania  Central nervous system:  Cranial nerves II through XII intact, tongue/uvula midline, all extremities muscle strength 5/5, sensation intact throughout, negative dysarthria, negative expressive aphasia, negative receptive aphasia.        Data Reviewed: Care during the described time interval was provided  by me .  I have reviewed this patient's available data, including medical history, events of note, physical examination, and all test results as part of my evaluation.   CBC: Recent Labs  Lab 02/24/19 1630 02/26/19 0250 02/27/19 0612 02/28/19 0700 03/01/19 0047  WBC 8.9 10.0 7.6 10.9* 14.7*  NEUTROABS 7.4 7.5 5.3 9.3* 12.7*  HGB 15.3* 14.6 15.3* 14.6 14.4  HCT 46.3* 44.7 47.0* 44.3 43.7  MCV 91.1 91.8 91.6 90.0 90.3  PLT 199 286 250 299 353   Basic Metabolic Panel: Recent Labs  Lab 02/24/19 1630 02/26/19 0250 02/27/19 0612 02/28/19 0700 03/01/19 0047  NA 136 136 138 132* 133*  K 3.9 3.6 3.9 4.3 4.3  CL 97* 99 105 100 101  CO2 22 24 24 22 22   GLUCOSE 302* 184* 135* 283* 232*  BUN 18 20 24* 21 22  CREATININE 0.55 0.52 0.63 0.57 0.60  CALCIUM 9.0 9.2 8.8* 8.5* 8.6*  MG  --   --   --  2.1 2.0   GFR: Estimated Creatinine Clearance: 72.1 mL/min (by C-G formula based on SCr of 0.6 mg/dL). Liver Function Tests: Recent Labs  Lab 02/24/19 1630 02/26/19 0250 02/27/19 0612 02/28/19  0700 03/01/19 0047  AST 30 27 47* 30 25  ALT 24 22 37 41 36  ALKPHOS 54 51 49 50 46  BILITOT 0.5 0.5 0.8 0.6 0.7  PROT 8.0 7.7 7.1 6.8 6.7  ALBUMIN 3.7 3.4* 3.3* 3.1* 3.2*   No results for input(s): LIPASE, AMYLASE in the last 168 hours. No results for input(s): AMMONIA in the last 168 hours. Coagulation Profile: No results for input(s): INR, PROTIME in the last 168 hours. Cardiac Enzymes: No results for input(s): CKTOTAL, CKMB, CKMBINDEX, TROPONINI in the last 168 hours. BNP (last 3 results) No results for input(s): PROBNP in the last 8760 hours. HbA1C: No results for input(s): HGBA1C in the last 72 hours. CBG: Recent Labs  Lab 02/27/19 2208 02/28/19 0743 02/28/19 1218 02/28/19 1709 02/28/19 2217  GLUCAP 295* 262* 276* 260* 257*   Lipid Profile: Recent Labs    02/28/19 0700  CHOL 202*  HDL 36*  LDLCALC 142*  TRIG 122  CHOLHDL 5.6   Thyroid Function Tests: No results for input(s): TSH, T4TOTAL, FREET4, T3FREE, THYROIDAB in the last 72 hours. Anemia Panel: No results for input(s): VITAMINB12, FOLATE, FERRITIN, TIBC, IRON, RETICCTPCT in the last 72 hours. Urine analysis:    Component Value Date/Time   COLORURINE YELLOW 04/08/2011 0950   APPEARANCEUR CLOUDY (A) 04/08/2011 0950   LABSPEC 1.034 (H) 04/08/2011 0950   PHURINE 6.0 04/08/2011 0950   GLUCOSEU NEGATIVE 04/08/2011 0950   HGBUR NEGATIVE 04/08/2011 0950   BILIRUBINUR NEGATIVE 04/08/2011 0950   KETONESUR NEGATIVE 04/08/2011 0950   PROTEINUR NEGATIVE 04/08/2011 0950   UROBILINOGEN 0.2 04/08/2011 0950   NITRITE NEGATIVE 04/08/2011 0950   LEUKOCYTESUR NEGATIVE 04/08/2011 0950   Sepsis Labs: @LABRCNTIP (procalcitonin:4,lacticidven:4)  ) Recent Results (from the past 240 hour(s))  SARS Coronavirus 2 [facility] order, Performed in [facility] hospital lab) Nasopharyngeal Nasopharyngeal Swab     Status: Abnormal   Collection Time: 02/24/19  4:30 PM   Specimen: Nasopharyngeal Swab  Result Value Ref Range  Status   SARS Coronavirus 2 POSITIVE (A) NEGATIVE Final    Comment: RESULT CALLED TO, READ BACK BY AND VERIFIED WITH: HODGES,C RN @2003  ON 02/24/2019 JACKSON,K (NOTE) If result is NEGATIVE SARS-CoV-2 target nucleic acids are NOT DETECTED. The SARS-CoV-2 RNA is generally detectable in upper and lower  respiratory specimens during the acute phase of infection. The lowest  concentration of SARS-CoV-2 viral copies this assay can detect is 250  copies / mL. A negative result does not preclude SARS-CoV-2 infection  and should not be used as the sole basis for treatment or other  patient management decisions.  A negative result may occur with  improper specimen collection / handling, submission of specimen other  than nasopharyngeal swab, presence of viral mutation(s) within the  areas targeted by this assay, and inadequate number of viral copies  (<250 copies / mL). A negative result must be combined with clinical  observations, patient history, and epidemiological information. If result is POSITIVE SARS-CoV-2 target nucleic acids are DETECTED . The SARS-CoV-2 RNA is generally detectable in upper and lower  respiratory specimens during the acute phase of infection.  Positive  results are indicative of active infection with SARS-CoV-2.  Clinical  correlation with patient history and other diagnostic information is  necessary to determine patient infection status.  Positive results do  not rule out bacterial infection or co-infection with other viruses. If result is PRESUMPTIVE POSTIVE SARS-CoV-2 nucleic acids MAY BE PRESENT.   A presumptive positive result was obtained on the submitted specimen  and confirmed on repeat testing.  While 2019 novel coronavirus  (SARS-CoV-2) nucleic acids may be present in the submitted sample  additional confirmatory testing may be necessary for epidemiological  and / or clinical management purposes  to differentiate between  SARS-CoV-2 and other Sarbecovirus  currently known to infect humans.  If clinically indicated additional testing with an alternate test  methodology [phone] ) is advised. The SARS-CoV-2 RNA is generally  detectable in upper and lower respiratory specimens during the acute  phase of infection. The expected result is Negative. Fact Sheet for Patients:  StrictlyIdeas.no Fact Sheet for Healthcare Providers: BankingDealers.co.za This test is not yet approved or cleared by the [place] FDA and has been authorized for detection and/or diagnosis of SARS-CoV-2 by FDA under an Emergency Use Authorization (EUA).  This EUA will remain in effect (meaning this test can be used) for the duration of the COVID-19 declaration under Section 564(b)(1) of the Act, 21 U.S.C. section 360bbb-3(b)(1), unless the authorization is terminated or revoked sooner. Performed at [facility], [place] [address], [place], [place] [address]   MRSA PCR Screening     Status: None   Collection Time: 02/27/19  8:17 AM   Specimen: Nasal Mucosa; Nasopharyngeal  Result Value Ref Range Status   MRSA by PCR NEGATIVE NEGATIVE Final    Comment:        The GeneXpert MRSA Assay (FDA approved for NASAL specimens only), is one component of a comprehensive MRSA colonization surveillance program. It is not intended to diagnose MRSA infection nor to guide or monitor treatment for MRSA infections. Performed at [facility], [place] [address], [place], [place] [address]          Radiology Studies: No results found.      Scheduled Meds: . albuterol  2 puff Inhalation BID  . amLODipine  5 mg Oral Daily  . aspirin  81 mg Oral QHS  . baclofen  10 mg Oral BID  . budesonide  1 puff Inhalation BID  . buPROPion  150 mg Oral Daily  . busPIRone  7.5 mg Oral BID  . Chlorhexidine Gluconate Cloth  6 each Topical Q0600  . DULoxetine  60 mg Oral Daily  . enoxaparin (LOVENOX)  injection  0.5 mg/kg  Subcutaneous BID  . fluticasone  2 spray Each Nare BID  . folic acid  2 mg Oral Daily  . gabapentin  600 mg Oral TID  . galantamine  16 mg Oral Q breakfast  . irbesartan  300 mg Oral Daily   And  . hydrochlorothiazide  25 mg Oral Daily  . hydroxychloroquine  200 mg Oral BID  . insulin aspart  0-20 Units Subcutaneous TID WC  . insulin aspart  0-5 Units Subcutaneous QHS  . insulin aspart  6 Units Subcutaneous TID WC  . insulin detemir  16 Units Subcutaneous BID  . methylPREDNISolone (SOLU-MEDROL) injection  60 mg Intravenous Q8H  . nebivolol  10 mg Oral Daily  . nystatin  5 mL Oral QID  . pantoprazole  40 mg Oral Daily  . potassium chloride  20 mEq Oral Daily  . senna  1 tablet Oral BID  . sodium chloride flush  3 mL Intravenous Q12H  . triamcinolone cream   Topical BID  . vitamin C  500 mg Oral Daily  . zinc sulfate  220 mg Oral Daily   Continuous Infusions:    LOS: 5 days   The patient is critically ill with multiple organ systems failure and requires high complexity decision making for assessment and support, frequent evaluation and titration of therapies, application of advanced monitoring technologies and extensive interpretation of multiple databases. Critical Care Time devoted to patient care services described in this note  Time spent: 40 minutes     Faige Seely, Geraldo Docker, MD [facility] Pager [phone]  If 7PM-7AM, please contact night-coverage [email or web address] Password [email or web address] 03/01/2019, 7:21 AM

## 2019-03-01 NOTE — Progress Notes (Signed)
Midlinf 18g x 8cm placed in the left upper arm (basilic vein) without difficulty.

## 2019-03-01 NOTE — Progress Notes (Signed)
NAME:  [name], MRN:  [id number], DOB:  [birth year], LOS: 5 ADMISSION DATE:  02/24/2019, CONSULTATION DATE: 8/4 REFERRING MD: Dr. Loleta Books, CHIEF COMPLAINT: Dyspnea  Brief History   73 year old female with multiple medical problems admitted on 8/2 in the setting of severe acute respiratory failure with hypoxemia due to COVID-19 pneumonia.  Moved to the intensive care unit on 8/4 due to worsening hypoxemia.  Past Medical History  Hypertension Asthma History of breast cancer Coronary artery disease Nocturnal oxygen, 2 L at night Diabetes mellitus Gastroesophageal reflux disease Hypertension Hyperlipidemia Obstructive sleep apnea  Significant Hospital Events   August 2 admission August 4 transfer to intensive care unit for heated high flow  Consults:  Pulmonary and critical care medicine  Procedures:    Significant Diagnostic Tests:    Micro Data:  8/2 SARS COV 2 > positive  Antimicrobials:  August 2 doxycycline x 1 dose August 2 hydroxychloroquine (home med for RA) August 2 remdesivir >8/6 August 2 Actemra August 2 decadron   Interim history/subjective:   Feels better Slept fairly well Voice stronger today  Objective   Blood pressure 132/77, pulse 63, temperature 98.6 F (37 C), temperature source Oral, resp. rate (!) 24, height 5\' 4"  (1.626 m), weight 102.9 kg, SpO2 93 %.    FiO2 (%):  [60 %] 60 %   Intake/Output Summary (Last 24 hours) at 03/01/2019 1457 Last data filed at 03/01/2019 1200 Gross per 24 hour  Intake 694 ml  Output 700 ml  Net -6 ml   Filed Weights   02/24/19 2322 02/24/19 2335 02/27/19 0400  Weight: 110 kg 102.8 kg 102.9 kg   Physical Exam: General: Well-appearing, no acute distress, on high flow [place] HENT: [place], AT, OP clear, MMM Eyes: EOMI, no scleral icterus Respiratory: Clear to auscultation bilaterally.  No crackles, wheezing or rales Cardiovascular: RRR, -M/R/G, no JVD GI: BS+, soft, nontender Extremities:-Edema,-tenderness  Neuro: AAO x4, CNII-XII grossly intact Skin: Intact, no rashes or bruising Psych: Normal mood, normal affect  Resolved Hospital Problem list     Assessment & Plan:   Acute hypoxemic respiratory failure secondary to COVID-19: Improving oxygenation Weaned off heated high flow. Continue to wean HFNC Maintain O2 saturations for goal SpO2 >85% Monitor closely for signs of ventilatory failure such as nasal flaring, accessory muscle use, abdominal paradoxical movements. Self-proning while in bed Wean solumedrol to 0.5mg /kg Pulmonary hygiene including bronchodilator, IS and flutter valve. AVOID manual percussion. Mobilize as tolerated  Asthma: not in active exacerbation Albuterol prn Pulmicort BID Monitor for wheezing  Rheumatoid arthritis Continue home hydroxychloroquine. Monitor QTc q3 days  Best practice:  Diet: regular Pain/Anxiety/Delirium protocol (if indicated): n/a VAP protocol (if indicated): n/a DVT prophylaxis: lovenox bid GI prophylaxis: n/a Glucose control: per TRH Mobility:  Out of bed, ROM exercises Code Status: full Family Communication: will discuss with TRH Disposition: remain in ICU  Labs   CBC: Recent Labs  Lab 02/24/19 1630 02/26/19 0250 02/27/19 0612 02/28/19 0700 03/01/19 0047  WBC 8.9 10.0 7.6 10.9* 14.7*  NEUTROABS 7.4 7.5 5.3 9.3* 12.7*  HGB 15.3* 14.6 15.3* 14.6 14.4  HCT 46.3* 44.7 47.0* 44.3 43.7  MCV 91.1 91.8 91.6 90.0 90.3  PLT 199 286 250 299 841    Basic Metabolic Panel: Recent Labs  Lab 02/24/19 1630 02/26/19 0250 02/27/19 0612 02/28/19 0700 03/01/19 0047  NA 136 136 138 132* 133*  K 3.9 3.6 3.9 4.3 4.3  CL 97* 99 105 100 101  CO2 22 24 24  22  22  GLUCOSE 302* 184* 135* 283* 232*  BUN 18 20 24* 21 22  CREATININE 0.55 0.52 0.63 0.57 0.60  CALCIUM 9.0 9.2 8.8* 8.5* 8.6*  MG  --   --   --  2.1 2.0   GFR: Estimated Creatinine Clearance: 72.1 mL/min (by C-G formula based on SCr of 0.6 mg/dL). Recent Labs  Lab 02/24/19  1630 02/26/19 0250 02/27/19 0612 02/28/19 0700 03/01/19 0047  PROCALCITON <0.10  --   --   --   --   WBC 8.9 10.0 7.6 10.9* 14.7*  LATICACIDVEN 1.7  --   --   --   --     Liver Function Tests: Recent Labs  Lab 02/24/19 1630 02/26/19 0250 02/27/19 0612 02/28/19 0700 03/01/19 0047  AST 30 27 47* 30 25  ALT 24 22 37 41 36  ALKPHOS 54 51 49 50 46  BILITOT 0.5 0.5 0.8 0.6 0.7  PROT 8.0 7.7 7.1 6.8 6.7  ALBUMIN 3.7 3.4* 3.3* 3.1* 3.2*   No results for input(s): LIPASE, AMYLASE in the last 168 hours. No results for input(s): AMMONIA in the last 168 hours.  ABG No results found for: PHART, PCO2ART, PO2ART, HCO3, TCO2, ACIDBASEDEF, O2SAT   Coagulation Profile: No results for input(s): INR, PROTIME in the last 168 hours.  Cardiac Enzymes: No results for input(s): CKTOTAL, CKMB, CKMBINDEX, TROPONINI in the last 168 hours.  HbA1C: Hgb A1c MFr Bld  Date/Time Value Ref Range Status  02/26/2019 02:50 AM 8.1 (H) 4.8 - 5.6 % Final    Comment:    (NOTE) Pre diabetes:          5.7%-6.4% Diabetes:              >6.4% Glycemic control for   <7.0% adults with diabetes   09/03/2015 11:08 AM 6.2 (H) 4.8 - 5.6 % Final    Comment:    (NOTE)         Pre-diabetes: 5.7 - 6.4         Diabetes: >6.4         Glycemic control for adults with diabetes: <7.0     CBG: Recent Labs  Lab 02/28/19 1218 02/28/19 1709 02/28/19 2217 03/01/19 0752 03/01/19 1241  GLUCAP 276* 260* 257* 262* 264*     Critical care time: 31 minutes    The patient is critically ill with multiple organ systems failure and requires high complexity decision making for assessment and support, frequent evaluation and titration of therapies, application of advanced monitoring technologies and extensive interpretation of multiple databases.   Discussed and co-managed patient care with PCCM-Hospitalist. Coordinated care with RT, RN and pharmacist.  Rodman Pickle, M.D. [facility] Pulmonary/Critical Care Medicine  03/01/2019 3:23 PM  Pager: [phone] After hours pager: [phone]

## 2019-03-01 NOTE — Progress Notes (Signed)
Lattie Haw (daughter) updated on pt status.  All questions answered at this time.

## 2019-03-02 LAB — C-REACTIVE PROTEIN: CRP: <0.8 mg/dL (ref <1.0)

## 2019-03-02 LAB — GLUCOSE, CAPILLARY
Glucose-Capillary: 100 mg/dL — ABNORMAL HIGH (ref 70–99)
Glucose-Capillary: 167 mg/dL — ABNORMAL HIGH (ref 70–99)
Glucose-Capillary: 303 mg/dL — ABNORMAL HIGH (ref 70–99)
Glucose-Capillary: 251 mg/dL — ABNORMAL HIGH (ref 70–99)

## 2019-03-02 LAB — MAGNESIUM: Magnesium: 2.1 mg/dL (ref 1.7–2.4)

## 2019-03-02 LAB — D-DIMER, QUANTITATIVE: D-Dimer, Quant: 1.07 ug/mL-FEU — ABNORMAL HIGH (ref 0.00–0.50)

## 2019-03-02 MED ORDER — FUROSEMIDE 40 MG PO TABS: 40.0000 mg | ORAL_TABLET | Freq: Once | ORAL | Status: DC

## 2019-03-02 MED ORDER — FUROSEMIDE 40 MG PO TABS
40.0000 mg | ORAL_TABLET | Freq: Once | ORAL | Status: AC
  Administered 2019-03-02: 40 mg via ORAL
  Filled 2019-03-02: qty 1

## 2019-03-02 MED ORDER — INSULIN DETEMIR 100 UNIT/ML ~~LOC~~ SOLN
16.0000 [IU] | Freq: Two times a day (BID) | SUBCUTANEOUS | Status: DC
  Administered 2019-03-02 – 2019-03-07 (×10): 16 [IU] via SUBCUTANEOUS
  Filled 2019-03-02 (×10): qty 0.16

## 2019-03-02 MED ORDER — DEXAMETHASONE 6 MG PO TABS
6.0000 mg | ORAL_TABLET | Freq: Every day | ORAL | Status: DC
  Administered 2019-03-02 – 2019-03-07 (×6): 6 mg via ORAL
  Filled 2019-03-02 (×7): qty 1

## 2019-03-02 MED ORDER — INSULIN ASPART 100 UNIT/ML ~~LOC~~ SOLN
6.0000 [IU] | Freq: Three times a day (TID) | SUBCUTANEOUS | Status: DC
  Administered 2019-03-02 – 2019-03-07 (×14): 6 [IU] via SUBCUTANEOUS

## 2019-03-02 NOTE — Progress Notes (Signed)
NAME:  [name], MRN:  [id number], DOB:  Dec 10, 1945, LOS: 67 ADMISSION DATE:  02/24/2019, CONSULTATION DATE: 8/4 REFERRING MD: Dr. Loleta Books, CHIEF COMPLAINT: Dyspnea  Brief History   73 year old female with multiple medical problems admitted on 8/2 in the setting of severe acute respiratory failure with hypoxemia due to COVID-19 pneumonia.  Moved to the intensive care unit on 8/4 due to worsening hypoxemia.  Past Medical History  Hypertension Asthma History of breast cancer Coronary artery disease Nocturnal oxygen, 2 L at night Diabetes mellitus Gastroesophageal reflux disease Hypertension Hyperlipidemia Obstructive sleep apnea  Significant Hospital Events   August 2 admission August 4 transfer to intensive care unit for heated high flow  Consults:  Pulmonary and critical care medicine  Procedures:    Significant Diagnostic Tests:    Micro Data:  8/2 SARS COV 2 > positive  Antimicrobials:  August 2 doxycycline x 1 dose August 2 hydroxychloroquine (home med for RA) August 2 remdesivir >8/6 August 2 Actemra August 2 decadron   Interim history/subjective:   Overall improved. Weaned to 8L HFNC. Will desat with exertion however will recover. Ready for transfer to PCU  Objective   Blood pressure (!) 108/56, pulse 64, temperature 97.6 F (36.4 C), temperature source Oral, resp. rate (!) 22, height 5\' 4"  (1.626 m), weight 102.9 kg, SpO2 93 %.        Intake/Output Summary (Last 24 hours) at 03/02/2019 1239 Last data filed at 03/02/2019 1000 Gross per 24 hour  Intake 493 ml  Output 525 ml  Net -32 ml   Filed Weights   02/24/19 2322 02/24/19 2335 02/27/19 0400  Weight: 110 kg 102.8 kg 102.9 kg   Physical Exam: General: Well-appearing, no acute distress, on high flow [place] HENT: [place], AT, OP clear, MMM Eyes: EOMI, no scleral icterus Respiratory: Clear to auscultation bilaterally.  No crackles, wheezing or rales Cardiovascular: RRR, -M/R/G, no JVD GI: BS+, soft,  nontender Extremities:-Edema,-tenderness Neuro: AAO x4, CNII-XII grossly intact Skin: Intact, no rashes or bruising Psych: Normal mood, normal affect  Resolved Hospital Problem list     Assessment & Plan:   Acute hypoxemic respiratory failure secondary to COVID-19: High risk for intubation Wean HFNC to home 2L O2 Maintain O2 saturations for goal SpO2 >85% Monitor closely for signs of ventilatory failure such as nasal flaring, accessory muscle use, abdominal paradoxical movements. Self-proning while in bed Decadron 6 mg IV/PO daily  x 10 days Pulmonary hygiene including bronchodilator, IS and flutter valve. AVOID manual percussion. Mobilize as tolerated Diurese: PO lasix 40 mg once  Asthma: not in active exacerbation Albuterol prn Pulmicort BID Monitor for wheezing  Rheumatoid arthritis Continue home hydroxychloroquine. Monitor QTc q3 days  Best practice:  Diet: regular Pain/Anxiety/Delirium protocol (if indicated): n/a VAP protocol (if indicated): n/a DVT prophylaxis: lovenox bid GI prophylaxis: n/a Glucose control: per TRH Mobility:  Out of bed, ROM exercises Code Status: full Family Communication: will discuss with TRH Disposition: Transfer to PCU  Labs   CBC: Recent Labs  Lab 02/24/19 1630 02/26/19 0250 02/27/19 0612 02/28/19 0700 03/01/19 0047  WBC 8.9 10.0 7.6 10.9* 14.7*  NEUTROABS 7.4 7.5 5.3 9.3* 12.7*  HGB 15.3* 14.6 15.3* 14.6 14.4  HCT 46.3* 44.7 47.0* 44.3 43.7  MCV 91.1 91.8 91.6 90.0 90.3  PLT 199 286 250 299 500    Basic Metabolic Panel: Recent Labs  Lab 02/24/19 1630 02/26/19 0250 02/27/19 0612 02/28/19 0700 03/01/19 0047 03/02/19 0100  NA 136 136 138 132* 133*  --  K 3.9 3.6 3.9 4.3 4.3  --   CL 97* 99 105 100 101  --   CO2 22 24 24 22 22   --   GLUCOSE 302* 184* 135* 283* 232*  --   BUN 18 20 24* 21 22  --   CREATININE 0.55 0.52 0.63 0.57 0.60  --   CALCIUM 9.0 9.2 8.8* 8.5* 8.6*  --   MG  --   --   --  2.1 2.0 2.1   GFR:  Estimated Creatinine Clearance: 72.1 mL/min (by C-G formula based on SCr of 0.6 mg/dL). Recent Labs  Lab 02/24/19 1630 02/26/19 0250 02/27/19 0612 02/28/19 0700 03/01/19 0047  PROCALCITON <0.10  --   --   --   --   WBC 8.9 10.0 7.6 10.9* 14.7*  LATICACIDVEN 1.7  --   --   --   --     Liver Function Tests: Recent Labs  Lab 02/24/19 1630 02/26/19 0250 02/27/19 0612 02/28/19 0700 03/01/19 0047  AST 30 27 47* 30 25  ALT 24 22 37 41 36  ALKPHOS 54 51 49 50 46  BILITOT 0.5 0.5 0.8 0.6 0.7  PROT 8.0 7.7 7.1 6.8 6.7  ALBUMIN 3.7 3.4* 3.3* 3.1* 3.2*   No results for input(s): LIPASE, AMYLASE in the last 168 hours. No results for input(s): AMMONIA in the last 168 hours.  ABG No results found for: PHART, PCO2ART, PO2ART, HCO3, TCO2, ACIDBASEDEF, O2SAT   Coagulation Profile: No results for input(s): INR, PROTIME in the last 168 hours.  Cardiac Enzymes: No results for input(s): CKTOTAL, CKMB, CKMBINDEX, TROPONINI in the last 168 hours.  HbA1C: Hgb A1c MFr Bld  Date/Time Value Ref Range Status  02/26/2019 02:50 AM 8.1 (H) 4.8 - 5.6 % Final    Comment:    (NOTE) Pre diabetes:          5.7%-6.4% Diabetes:              >6.4% Glycemic control for   <7.0% adults with diabetes   09/03/2015 11:08 AM 6.2 (H) 4.8 - 5.6 % Final    Comment:    (NOTE)         Pre-diabetes: 5.7 - 6.4         Diabetes: >6.4         Glycemic control for adults with diabetes: <7.0     CBG: Recent Labs  Lab 03/01/19 0752 03/01/19 1241 03/01/19 1608 03/01/19 2025 03/02/19 0906  GLUCAP 262* 264* 273* 321* 100*   COVID-19 Labs  Recent Labs    02/28/19 0700 03/01/19 0047 03/02/19 0100  DDIMER 1.66* 1.40* 1.07*  CRP <0.8 <0.8 <0.8    Lab Results  Component Value Date   SARSCOV2NAA POSITIVE (A) 02/24/2019     Critical care time: 32 minutes    The patient is critically ill with multiple organ systems failure and requires high complexity decision making for assessment and support,  frequent evaluation and titration of therapies, application of advanced monitoring technologies and extensive interpretation of multiple databases.   Discussed and co-managed patient care with PCCM-Hospitalist. Coordinated care with RT, RN and pharmacist.  Rodman Pickle, M.D. [facility] Pulmonary/Critical Care Medicine 03/02/2019 12:39 PM  Pager: [phone] After hours pager: [phone]

## 2019-03-02 NOTE — Progress Notes (Addendum)
Spoke with Dr Sherral Hammers re PIV vs midline .  No IV meds ordered at this time.  Okay to leave PIV out and will reevaluate 03/03/19. Multiple calls to pts RN, unable to contact to notify.  LA midline can be removed by staff RN due to infiltrate per IV Team consult.

## 2019-03-02 NOTE — Progress Notes (Signed)
Patient arrived to 1E, v/s stable, no complaints, admitted to tele

## 2019-03-02 NOTE — Progress Notes (Signed)
PROGRESS NOTE    [name]  QQP:[id number] DOB: Oct 06, 1945 DOA: 02/24/2019 PCP: Crist Infante, MD   Brief Narrative:  73 y.o. WF PMHx depression, asthma on 2 L of oxygen OSA, breast cancer s/p chemotherapy+ XRT chronic back pain, diabetes type 2 controlled with complication (diabetic peripheral neuropathy,, HLD, HTN, CAD, RA, OA, IBS  comes in with chief complaint of shortness of breath. She also has history of CAD, diabetes.Patient states that on Monday she started having some nasal congestion. She followed up with her PCP and had a test for COVID-19 that day which came back positive on Wednesday. Over the last couple of days she has had increased shortness of breath and decided to come to the ER.  According to EMS patient's O2 sats were in the low 80s when they arrived. She is currently on 15 L/nonrebreather.She is having shortness of breath with exertion and feeling of dizziness. She has a mild cough. Patient is also having subjective low-grade temps and weakness.  Pt has no hx of PE, DVT and denies any exogenous hormone (testosterone / estrogen) use, long distance travels or surgery in the past 6 weeks, active cancer, recent immobilization.note).  ED Course: In the emergency department patient had routine labs drawn which revealed elevation in inflammatory markers.  He was put to 15 L high flow nasal cannula improvement of her oxygen saturations to greater than 90%.  X-ray was performed which revealed a 19 pneumonia type changes.  She did complain of a sniffing and headache and she was given dexamethasone 10 mg IV along with a headache cocktail.  Patient referred for admission to [facility] to a progressive bed due to need for high flow nasal cannula oxygen.  Review of Systems: As per HPI otherwise 10 point review of systems negative. She does admit to cough that is non-productive, endorses nauses, light-headedness, continued headache.   Subjective: 8/8 A/O x4, positive S OB  (improving), negative N/V.  Complains of left upper arm pain around the knee power glide IV site.  Pain started after Solu-Medrol and H2O flush administered.   Assessment & Plan:   Active Problems:   Coronary artery disease   Hypertension   Diabetes mellitus, type II (HCC)   OSA (obstructive sleep apnea)   Pneumonia due to COVID-19 virus   Breast cancer (HCC)   History of cancer chemotherapy   History of radiation therapy   Diabetic neuropathy (HCC)   HLD (hyperlipidemia)   Essential hypertension   QT prolongation   Acute on chronic respiratory failure with hypoxia (HCC)   OSA on CPAP   Diabetes mellitus type 2, uncontrolled, with complications (HCC)  Acute on chronic respiratory failure with hypoxia/COVID 19 pneumonia -Patient normally on 2 L O2 via [place] at home - 8/8 decrease Solu-Medrol Decadron 6 mg daily -Remdesivir per pharmacy protocol - Actemra - Albuterol twice daily (patient has prolonged QT interval want to be cautious) - Pulmicort BID - Vitamins per COVID protocol - Daily COVID inflammatory markers pending Recent Labs  Lab 02/24/19 1630 02/28/19 0700 03/01/19 0047 03/02/19 0100  CRP 10.7* <0.8 <0.8 <0.8   Recent Labs  Lab 02/24/19 1630 02/28/19 0700 03/01/19 0047 03/02/19 0100  DDIMER 0.46 1.66* 1.40* 1.07*  - Improving O2 status now on HF [place]@8  L/min with SPO2 93%.  Stable for transfer to progressive care floor.  -Lasix 40 mg x1  Asthma -See see acute on chronic respiratory failure  OSA - See acute on chronic respiratory failure  Diabetes type 2  uncontrolled with complication -8/4 hemoglobin A1c= 8.1 - Lipid panel pending - Levemir 16 units BID  - 8/7 increase NovoLog 6 units QAC    - Resistant SSI  HLD - 8/6 lipid panel not within a AHA/ADA guidelines - LDL goal<70 - Patient with allergy to statins. - 8/7 start Zetia 10 mg daily  Essential HTN -Amlodipine 5 mg daily - Irbesartan 300 mg daily -HCTZ 20 mg daily - Labetalol PRN  RA -  Hold all NSAIDs - Baclofen 10 mg twice daily - Cymbalta 60 mg daily - Gabapentin 600 mg 3 times daily - Plaquenil 200 mg twice daily  QT prolongation -8/2 EKG QTC prolongation resolved.  Continue to monitor closely -8/8 EKG QTC prolongation pending  LEFT arm pain -Appears power glide IV may have infiltrated, will have IV removed. - We will request IV team place peripheral IV prior to removal of power glide. -Change Solu-Medrol to Decadron     DVT prophylaxis: Lovenox Code Status: Full Family Communication: Spoke with Laretta Bolster (daughter), went over plan of care concerning her mother answered all questions Disposition Plan: TBD   Consultants:  PCCM    Procedures/Significant Events:     I have personally reviewed and interpreted all radiology studies and my findings are as above.  VENTILATOR SETTINGS: HFNC O2 flow rate 8 L/min SPO2: 92%    Cultures   Antimicrobials: Anti-infectives (From admission, onward)   Start     Stop   02/25/19 2200  remdesivir 100 mg in sodium chloride 0.9 % 250 mL IVPB     03/01/19 2159   02/25/19 1000  doxycycline (VIBRA-TABS) tablet 100 mg  Status:  Discontinued     02/25/19 0728   02/25/19 0100  remdesivir 200 mg in sodium chloride 0.9 % 250 mL IVPB     02/25/19 0149   02/24/19 2330  hydroxychloroquine (PLAQUENIL) tablet 200 mg         02/24/19 2200  doxycycline (VIBRA-TABS) tablet 100 mg  Status:  Discontinued     02/24/19 2344       Devices    LINES / TUBES:      Continuous Infusions:    Objective: Vitals:   03/02/19 0000 03/02/19 0300 03/02/19 0400 03/02/19 0600  BP: 111/72  (!) 114/94 105/68  Pulse: 61 65 63 (!) 56  Resp: 18 17 20 18   Temp: 97.7 F (36.5 C)  97.6 F (36.4 C)   TempSrc: Oral  Oral   SpO2: (!) 89% 90% (!) 80% 92%  Weight:      Height:        Intake/Output Summary (Last 24 hours) at 03/02/2019 4709 Last data filed at 03/01/2019 2014 Gross per 24 hour  Intake 240 ml  Output 1025 ml   Net -785 ml   Filed Weights   02/24/19 2322 02/24/19 2335 02/27/19 0400  Weight: 110 kg 102.8 kg 102.9 kg   Physical Exam:  General: A/O x4, positive acute on chronic respiratory distress Eyes: negative scleral hemorrhage, negative anisocoria, negative icterus ENT: Negative Runny nose, negative gingival bleeding, Neck:  Negative scars, masses, torticollis, lymphadenopathy, JVD Lungs: Clear to auscultation bilaterally without wheezes or crackles Cardiovascular: Regular rate and rhythm without murmur gallop or rub normal S1 and S2 Abdomen: negative abdominal pain, nondistended, positive soft, bowel sounds, no rebound, no ascites, no appreciable mass Extremities: No significant cyanosis, clubbing, or edema bilateral lower extremities Skin: Negative rashes, lesions, ulcers Psychiatric:  Negative depression, negative anxiety, negative fatigue, negative mania  Central nervous  system:  Cranial nerves II through XII intact, tongue/uvula midline, all extremities muscle strength 5/5, sensation intact throughout, negative dysarthria, negative expressive aphasia, negative receptive aphasia.        Data Reviewed: Care during the described time interval was provided by me .  I have reviewed this patient's available data, including medical history, events of note, physical examination, and all test results as part of my evaluation.   CBC: Recent Labs  Lab 02/24/19 1630 02/26/19 0250 02/27/19 0612 02/28/19 0700 03/01/19 0047  WBC 8.9 10.0 7.6 10.9* 14.7*  NEUTROABS 7.4 7.5 5.3 9.3* 12.7*  HGB 15.3* 14.6 15.3* 14.6 14.4  HCT 46.3* 44.7 47.0* 44.3 43.7  MCV 91.1 91.8 91.6 90.0 90.3  PLT 199 286 250 299 681   Basic Metabolic Panel: Recent Labs  Lab 02/24/19 1630 02/26/19 0250 02/27/19 0612 02/28/19 0700 03/01/19 0047 03/02/19 0100  NA 136 136 138 132* 133*  --   K 3.9 3.6 3.9 4.3 4.3  --   CL 97* 99 105 100 101  --   CO2 22 24 24 22 22   --   GLUCOSE 302* 184* 135* 283* 232*  --    BUN 18 20 24* 21 22  --   CREATININE 0.55 0.52 0.63 0.57 0.60  --   CALCIUM 9.0 9.2 8.8* 8.5* 8.6*  --   MG  --   --   --  2.1 2.0 2.1   GFR: Estimated Creatinine Clearance: 72.1 mL/min (by C-G formula based on SCr of 0.6 mg/dL). Liver Function Tests: Recent Labs  Lab 02/24/19 1630 02/26/19 0250 02/27/19 0612 02/28/19 0700 03/01/19 0047  AST 30 27 47* 30 25  ALT 24 22 37 41 36  ALKPHOS 54 51 49 50 46  BILITOT 0.5 0.5 0.8 0.6 0.7  PROT 8.0 7.7 7.1 6.8 6.7  ALBUMIN 3.7 3.4* 3.3* 3.1* 3.2*   No results for input(s): LIPASE, AMYLASE in the last 168 hours. No results for input(s): AMMONIA in the last 168 hours. Coagulation Profile: No results for input(s): INR, PROTIME in the last 168 hours. Cardiac Enzymes: No results for input(s): CKTOTAL, CKMB, CKMBINDEX, TROPONINI in the last 168 hours. BNP (last 3 results) No results for input(s): PROBNP in the last 8760 hours. HbA1C: No results for input(s): HGBA1C in the last 72 hours. CBG: Recent Labs  Lab 02/28/19 2217 03/01/19 0752 03/01/19 1241 03/01/19 1608 03/01/19 2025  GLUCAP 257* 262* 264* 273* 321*   Lipid Profile: Recent Labs    02/28/19 0700  CHOL 202*  HDL 36*  LDLCALC 142*  TRIG 122  CHOLHDL 5.6   Thyroid Function Tests: No results for input(s): TSH, T4TOTAL, FREET4, T3FREE, THYROIDAB in the last 72 hours. Anemia Panel: No results for input(s): VITAMINB12, FOLATE, FERRITIN, TIBC, IRON, RETICCTPCT in the last 72 hours. Urine analysis:    Component Value Date/Time   COLORURINE YELLOW 04/08/2011 0950   APPEARANCEUR CLOUDY (A) 04/08/2011 0950   LABSPEC 1.034 (H) 04/08/2011 0950   PHURINE 6.0 04/08/2011 0950   GLUCOSEU NEGATIVE 04/08/2011 0950   HGBUR NEGATIVE 04/08/2011 0950   BILIRUBINUR NEGATIVE 04/08/2011 0950   KETONESUR NEGATIVE 04/08/2011 0950   PROTEINUR NEGATIVE 04/08/2011 0950   UROBILINOGEN 0.2 04/08/2011 0950   NITRITE NEGATIVE 04/08/2011 0950   LEUKOCYTESUR NEGATIVE 04/08/2011 0950    Sepsis Labs: @LABRCNTIP (procalcitonin:4,lacticidven:4)  ) Recent Results (from the past 240 hour(s))  SARS Coronavirus 2 [facility] order, Performed in [facility] hospital lab) Nasopharyngeal Nasopharyngeal Swab     Status: Abnormal  Collection Time: 02/24/19  4:30 PM   Specimen: Nasopharyngeal Swab  Result Value Ref Range Status   SARS Coronavirus 2 POSITIVE (A) NEGATIVE Final    Comment: RESULT CALLED TO, READ BACK BY AND VERIFIED WITH: HODGES,C RN @2003  ON 02/24/2019 JACKSON,K (NOTE) If result is NEGATIVE SARS-CoV-2 target nucleic acids are NOT DETECTED. The SARS-CoV-2 RNA is generally detectable in upper and lower  respiratory specimens during the acute phase of infection. The lowest  concentration of SARS-CoV-2 viral copies this assay can detect is 250  copies / mL. A negative result does not preclude SARS-CoV-2 infection  and should not be used as the sole basis for treatment or other  patient management decisions.  A negative result may occur with  improper specimen collection / handling, submission of specimen other  than nasopharyngeal swab, presence of viral mutation(s) within the  areas targeted by this assay, and inadequate number of viral copies  (<250 copies / mL). A negative result must be combined with clinical  observations, patient history, and epidemiological information. If result is POSITIVE SARS-CoV-2 target nucleic acids are DETECTED . The SARS-CoV-2 RNA is generally detectable in upper and lower  respiratory specimens during the acute phase of infection.  Positive  results are indicative of active infection with SARS-CoV-2.  Clinical  correlation with patient history and other diagnostic information is  necessary to determine patient infection status.  Positive results do  not rule out bacterial infection or co-infection with other viruses. If result is PRESUMPTIVE POSTIVE SARS-CoV-2 nucleic acids MAY BE PRESENT.   A presumptive positive result was  obtained on the submitted specimen  and confirmed on repeat testing.  While 2019 novel coronavirus  (SARS-CoV-2) nucleic acids may be present in the submitted sample  additional confirmatory testing may be necessary for epidemiological  and / or clinical management purposes  to differentiate between  SARS-CoV-2 and other Sarbecovirus currently known to infect humans.  If clinically indicated additional testing with an alternate test  methodology [phone] ) is advised. The SARS-CoV-2 RNA is generally  detectable in upper and lower respiratory specimens during the acute  phase of infection. The expected result is Negative. Fact Sheet for Patients:  StrictlyIdeas.no Fact Sheet for Healthcare Providers: BankingDealers.co.za This test is not yet approved or cleared by the [place] FDA and has been authorized for detection and/or diagnosis of SARS-CoV-2 by FDA under an Emergency Use Authorization (EUA).  This EUA will remain in effect (meaning this test can be used) for the duration of the COVID-19 declaration under Section 564(b)(1) of the Act, 21 U.S.C. section 360bbb-3(b)(1), unless the authorization is terminated or revoked sooner. Performed at [facility], [place] [address] [place], [place] [address]   MRSA PCR Screening     Status: None   Collection Time: 02/27/19  8:17 AM   Specimen: Nasal Mucosa; Nasopharyngeal  Result Value Ref Range Status   MRSA by PCR NEGATIVE NEGATIVE Final    Comment:        The GeneXpert MRSA Assay (FDA approved for NASAL specimens only), is one component of a comprehensive MRSA colonization surveillance program. It is not intended to diagnose MRSA infection nor to guide or monitor treatment for MRSA infections. Performed at [facility], [place] [address], [place], [place] [address]          Radiology Studies: No results found.      Scheduled Meds: .  albuterol  2 puff Inhalation BID  . amLODipine  5 mg  Oral Daily  . aspirin  81 mg Oral QHS  . baclofen  10 mg Oral BID  . budesonide  1 puff Inhalation BID  . buPROPion  150 mg Oral Daily  . busPIRone  7.5 mg Oral BID  . Chlorhexidine Gluconate Cloth  6 each Topical Q0600  . DULoxetine  60 mg Oral Daily  . enoxaparin (LOVENOX) injection  0.5 mg/kg Subcutaneous BID  . ezetimibe  10 mg Oral Daily  . fluticasone  2 spray Each Nare BID  . folic acid  2 mg Oral Daily  . gabapentin  600 mg Oral TID  . galantamine  16 mg Oral Q breakfast  . irbesartan  300 mg Oral Daily   And  . hydrochlorothiazide  25 mg Oral Daily  . hydroxychloroquine  200 mg Oral BID  . insulin aspart  0-20 Units Subcutaneous TID WC  . insulin aspart  0-5 Units Subcutaneous QHS  . insulin aspart  10 Units Subcutaneous TID WC  . insulin detemir  20 Units Subcutaneous BID  . methylPREDNISolone (SOLU-MEDROL) injection  60 mg Intravenous Daily  . nebivolol  10 mg Oral Daily  . nystatin  5 mL Oral QID  . pantoprazole  40 mg Oral Daily  . potassium chloride  20 mEq Oral Daily  . sodium chloride flush  10-40 mL Intracatheter Q12H  . sodium chloride flush  3 mL Intravenous Q12H  . triamcinolone cream   Topical BID  . vitamin C  500 mg Oral Daily  . zinc sulfate  220 mg Oral Daily   Continuous Infusions:    LOS: 6 days   The patient is critically ill with multiple organ systems failure and requires high complexity decision making for assessment and support, frequent evaluation and titration of therapies, application of advanced monitoring technologies and extensive interpretation of multiple databases. Critical Care Time devoted to patient care services described in this note  Time spent: 40 minutes     , Geraldo Docker, MD [facility] Pager [phone]  If 7PM-7AM, please contact night-coverage [email or web address] Password [email or web address] 03/02/2019, 7:23 AM

## 2019-03-02 NOTE — Progress Notes (Signed)
Pt A&Ox4, up to BSC, VSS on 12Lpm HFNC. Denies pain. Pt informed that she will be transferring to rm#[id number]. Called [phone] twice, no answer from receiving nurse. Awaiting call back.

## 2019-03-02 NOTE — Progress Notes (Signed)
Attempted to call report x3 at [employer], [place], and [phone]. Awaiting call back from receiving RN.

## 2019-03-02 NOTE — Plan of Care (Signed)
Pt is on Continuous HFNC with need for O2 increase with minimal activity.

## 2019-03-03 LAB — C-REACTIVE PROTEIN: CRP: <0.8 mg/dL (ref <1.0)

## 2019-03-03 LAB — CBC WITH DIFFERENTIAL/PLATELET
Abs Immature Granulocytes: 0.45 10*3/uL — ABNORMAL HIGH (ref 0.00–0.07)
Basophils Absolute: 0.1 10*3/uL (ref 0.0–0.1)
Basophils Relative: 0 %
Eosinophils Absolute: 0 10*3/uL (ref 0.0–0.5)
Eosinophils Relative: 0 %
HCT: 46.8 % — ABNORMAL HIGH (ref 36.0–46.0)
Hemoglobin: 15.2 g/dL — ABNORMAL HIGH (ref 12.0–15.0)
Immature Granulocytes: 3 %
Lymphocytes Relative: 10 %
Lymphs Abs: 1.5 10*3/uL (ref 0.7–4.0)
MCH: 29.4 pg (ref 26.0–34.0)
MCHC: 32.5 g/dL (ref 30.0–36.0)
MCV: 90.5 fL (ref 80.0–100.0)
Monocytes Absolute: 0.8 10*3/uL (ref 0.1–1.0)
Monocytes Relative: 5 %
Neutro Abs: 12.7 10*3/uL — ABNORMAL HIGH (ref 1.7–7.7)
Neutrophils Relative %: 82 %
Platelets: 317 10*3/uL (ref 150–400)
RBC: 5.17 MIL/uL — ABNORMAL HIGH (ref 3.87–5.11)
RDW: 12.9 % (ref 11.5–15.5)
WBC: 15.4 10*3/uL — ABNORMAL HIGH (ref 4.0–10.5)
nRBC: 0.1 % (ref 0.0–0.2)

## 2019-03-03 LAB — BASIC METABOLIC PANEL
Anion gap: 10 (ref 5–15)
BUN: 39 mg/dL — ABNORMAL HIGH (ref 8–23)
CO2: 25 mmol/L (ref 22–32)
Calcium: 8.7 mg/dL — ABNORMAL LOW (ref 8.9–10.3)
Chloride: 99 mmol/L (ref 98–111)
Creatinine, Ser: 0.7 mg/dL (ref 0.44–1.00)
GFR calc Af Amer: >60 mL/min (ref >60)
GFR calc non Af Amer: >60 mL/min (ref >60)
Glucose, Bld: 183 mg/dL — ABNORMAL HIGH (ref 70–99)
Potassium: 4.1 mmol/L (ref 3.5–5.1)
Sodium: 134 mmol/L — ABNORMAL LOW (ref 135–145)

## 2019-03-03 LAB — GLUCOSE, CAPILLARY
Glucose-Capillary: 178 mg/dL — ABNORMAL HIGH (ref 70–99)
Glucose-Capillary: 153 mg/dL — ABNORMAL HIGH (ref 70–99)
Glucose-Capillary: 190 mg/dL — ABNORMAL HIGH (ref 70–99)
Glucose-Capillary: 204 mg/dL — ABNORMAL HIGH (ref 70–99)

## 2019-03-03 LAB — D-DIMER, QUANTITATIVE: D-Dimer, Quant: 0.99 ug/mL-FEU — ABNORMAL HIGH (ref 0.00–0.50)

## 2019-03-03 LAB — MAGNESIUM: Magnesium: 2.3 mg/dL (ref 1.7–2.4)

## 2019-03-03 MED ORDER — ENOXAPARIN SODIUM 40 MG/0.4ML ~~LOC~~ SOLN
40.0000 mg | SUBCUTANEOUS | Status: DC
  Administered 2019-03-03 – 2019-03-07 (×5): 40 mg via SUBCUTANEOUS
  Filled 2019-03-03 (×5): qty 0.4

## 2019-03-03 MED ORDER — FUROSEMIDE 20 MG PO TABS
40.0000 mg | ORAL_TABLET | Freq: Every day | ORAL | Status: DC
  Administered 2019-03-03 – 2019-03-07 (×5): 40 mg via ORAL
  Filled 2019-03-03 (×5): qty 2

## 2019-03-03 NOTE — Progress Notes (Signed)
PROGRESS NOTE                                                                                                                                                                                                             Patient Demographics:    [name], is a 73 y.o. female, DOB - [birth year], RPR:[id number]  Outpatient Primary MD for the patient is Crist Infante, MD   Admit date - 02/24/2019   LOS - 7  Chief Complaint  Patient presents with  . Shortness of Breath       Brief Narrative: Patient is a 73 y.o. female with PMHx of chronic hypoxemic respiratory failure on 2 L of oxygen at home nocturnally, HTN, DM, RA, OSA, history of breast cancer in remission-presented to the hospital with acute hypoxemic respiratory failure due to COVID-19 pneumonia.  Hospital course complicated by worsening hypoxemia requiring transfer to the ICU for close monitoring.  Managed with supportive care-upon further stability transferred back to PCU on 8/9.  See below for further details   Subjective:    [name] today remains on approximately 8-9 L of oxygen via high flow.  She does not have any chest pain-or any shortness of breath at rest.   Assessment  & Plan :   Acute on chronic hypoxemic (on 2 L of oxygen at nocturnally) resp Failure due to Covid 19 Viral pneumonia: Slowly improving-still remains on approximately 8-10 L of oxygen via high flow.  Encourage incentive spirometry, proning.  Continue steroids-and other supportive care.  No signs of volume overload-but will start oral Lasix daily and DC HCTZ.  Will try and maintain negative balance.  Start mobilizing with PT/OT (ordered this morning)  COVID-19 Labs: Recent Labs    03/01/19 0047 03/02/19 0100 03/03/19 0555  DDIMER 1.40* 1.07* 0.99*  CRP <0.8 <0.8 <0.8    COVID-19 Medications: Steroids: Decadron 8/2>> 8/5, Solu-Medrol 8/5>> 8/8, Decadron 8/8>> Remdesivir: 8/2>> 8/6  Actemra: 8/2 x 1 Convalescent Plasma: N/A Research Studies: N/A  DM-2: CBG this morning relatively stable-change from Solu-Medrol to Decadron on 8/8-for now continue with Levemir 16 units twice daily, 6 units of NovoLog with meals and SSI-follow.  HTN: Stable-continue amlodipine, Avapro, switching HCTZ to Lasix  Bronchial asthma: Stable-no wheezing-no signs of active exacerbation-continue bronchodilators  Rheumatoid arthritis: Stable-no signs of flare-continue Plaquenil-follow QTC periodically  GERD:  Continue PPI  History of breast cancer-currently in remission  Depression/anxiety: Currently stable-continue Cymbalta, Wellbutrin, BuSpar  Diabetic peripheral neuropathy: Stable-continue with Neurontin  Chronic hypoxemic respiratory failure on nocturnal O2  OSA: Unable to use CPAP in this facility-use high flow oxygen at night  Deconditioning/debility: Secondary to acute illness-begin PT/OT.  Obesity: Estimated body mass index is 38.94 kg/m as calculated from the following:   Height as of this encounter: 5\' 4"  (1.626 m).   Weight as of this encounter: 102.9 kg.   ABG: No results found for: PHART, PCO2ART, PO2ART, HCO3, TCO2, ACIDBASEDEF, O2SAT  Vent Settings: N/A  Condition - Stable  Family Communication  :  Left voicemail for daughter  Code Status :  Full Code  Diet :  Diet Order            Diet heart healthy/carb modified Room service appropriate? Yes; Fluid consistency: Thin  Diet effective now               Disposition Plan  :  Remain hospitalized-not stable for discharge-require several more days-however need to figure out if HHPT vs SNF-await PT  Consults  :  PCCM  Procedures  :  None  GI prophylaxis: PPI  DVT Prophylaxis  :  Lovenox   Lab Results  Component Value Date   PLT 317 03/03/2019    Antibiotics  :    Anti-infectives (From admission, onward)   Start     Dose/Rate Route Frequency Ordered Stop   02/25/19 2200  remdesivir 100 mg in  sodium chloride 0.9 % 250 mL IVPB     100 mg 500 mL/hr over 30 Minutes Intravenous Every 24 hours 02/24/19 2336 02/28/19 2349   02/25/19 1000  doxycycline (VIBRA-TABS) tablet 100 mg  Status:  Discontinued     100 mg Oral 2 times daily 02/24/19 2344 02/25/19 0728   02/25/19 0100  remdesivir 200 mg in sodium chloride 0.9 % 250 mL IVPB     200 mg 500 mL/hr over 30 Minutes Intravenous Once 02/24/19 2336 02/25/19 0149   02/24/19 2330  hydroxychloroquine (PLAQUENIL) tablet 200 mg     200 mg Oral 2 times daily 02/24/19 2040     02/24/19 2200  doxycycline (VIBRA-TABS) tablet 100 mg  Status:  Discontinued     100 mg Oral 2 times daily 02/24/19 2040 02/24/19 2344      Inpatient Medications  Scheduled Meds: . albuterol  2 puff Inhalation BID  . amLODipine  5 mg Oral Daily  . aspirin  81 mg Oral QHS  . baclofen  10 mg Oral BID  . budesonide  1 puff Inhalation BID  . buPROPion  150 mg Oral Daily  . busPIRone  7.5 mg Oral BID  . Chlorhexidine Gluconate Cloth  6 each Topical Q0600  . dexamethasone  6 mg Oral Q breakfast  . DULoxetine  60 mg Oral Daily  . enoxaparin (LOVENOX) injection  0.5 mg/kg Subcutaneous BID  . ezetimibe  10 mg Oral Daily  . fluticasone  2 spray Each Nare BID  . folic acid  2 mg Oral Daily  . gabapentin  600 mg Oral TID  . galantamine  16 mg Oral Q breakfast  . irbesartan  300 mg Oral Daily   And  . hydrochlorothiazide  25 mg Oral Daily  . hydroxychloroquine  200 mg Oral BID  . insulin aspart  0-20 Units Subcutaneous TID WC  . insulin aspart  0-5 Units Subcutaneous QHS  . insulin aspart  6  Units Subcutaneous TID WC  . insulin detemir  16 Units Subcutaneous BID  . nebivolol  10 mg Oral Daily  . nystatin  5 mL Oral QID  . pantoprazole  40 mg Oral Daily  . potassium chloride  20 mEq Oral Daily  . sodium chloride flush  10-40 mL Intracatheter Q12H  . sodium chloride flush  3 mL Intravenous Q12H  . triamcinolone cream   Topical BID  . vitamin C  500 mg Oral Daily  .  zinc sulfate  220 mg Oral Daily   Continuous Infusions: PRN Meds:.acetaminophen, labetalol, loperamide, LORazepam, prochlorperazine, sodium chloride flush, traMADol, zolpidem   Time Spent in minutes  35  See all Orders from today for further details   Oren Binet M.D on 03/03/2019 at 10:25 AM  To page go to [email or web address] - use universal password  [facility] -  Office  [phone]    Objective:   Vitals:   03/03/19 0200 03/03/19 0300 03/03/19 0405 03/03/19 0800  BP:   116/68 121/67  Pulse: 87 (!) 59 63 (!) 58  Resp: (!) 27 16 18 17   Temp:   (!) 97.5 F (36.4 C) 97.9 F (36.6 C)  TempSrc:   Oral Oral  SpO2: (!) 85% 96% 96% 92%  Weight:      Height:        Wt Readings from Last 3 Encounters:  02/27/19 102.9 kg  01/09/19 111.2 kg  08/07/18 111.6 kg     Intake/Output Summary (Last 24 hours) at 03/03/2019 1025 Last data filed at 03/03/2019 0300 Gross per 24 hour  Intake 1140 ml  Output 675 ml  Net 465 ml     Physical Exam Gen Exam:Alert awake-not in any distress HEENT:atraumatic, normocephalic Chest: B/L rales CVS:S1S2 regular Abdomen:soft non tender, non distended Extremities:no edema Neurology: Non focal Skin: no rash   Data Review:    CBC Recent Labs  Lab 02/26/19 0250 02/27/19 0612 02/28/19 0700 03/01/19 0047 03/03/19 0555  WBC 10.0 7.6 10.9* 14.7* 15.4*  HGB 14.6 15.3* 14.6 14.4 15.2*  HCT 44.7 47.0* 44.3 43.7 46.8*  PLT 286 250 299 227 317  MCV 91.8 91.6 90.0 90.3 90.5  MCH 30.0 29.8 29.7 29.8 29.4  MCHC 32.7 32.6 33.0 33.0 32.5  RDW 12.7 12.8 12.3 12.6 12.9  LYMPHSABS 1.6 1.6 1.1 1.0 1.5  MONOABS 0.7 0.4 0.2 0.6 0.8  EOSABS 0.0 0.0 0.0 0.0 0.0  BASOSABS 0.0 0.0 0.0 0.1 0.1    Chemistries  Recent Labs  Lab 02/24/19 1630 02/26/19 0250 02/27/19 0612 02/28/19 0700 03/01/19 0047 03/02/19 0100 03/03/19 0555  NA 136 136 138 132* 133*  --  134*  K 3.9 3.6 3.9 4.3 4.3  --  4.1  CL 97* 99 105 100 101  --  99  CO2 22 24 24  22 22   --  25  GLUCOSE 302* 184* 135* 283* 232*  --  183*  BUN 18 20 24* 21 22  --  39*  CREATININE 0.55 0.52 0.63 0.57 0.60  --  0.70  CALCIUM 9.0 9.2 8.8* 8.5* 8.6*  --  8.7*  MG  --   --   --  2.1 2.0 2.1 2.3  AST 30 27 47* 30 25  --   --   ALT 24 22 37 41 36  --   --   ALKPHOS 54 51 49 50 46  --   --   BILITOT 0.5 0.5 0.8 0.6 0.7  --   --    ------------------------------------------------------------------------------------------------------------------  No results for input(s): CHOL, HDL, LDLCALC, TRIG, CHOLHDL, LDLDIRECT in the last 72 hours.  Lab Results  Component Value Date   HGBA1C 8.1 (H) 02/26/2019   ------------------------------------------------------------------------------------------------------------------ No results for input(s): TSH, T4TOTAL, T3FREE, THYROIDAB in the last 72 hours.  Invalid input(s): FREET3 ------------------------------------------------------------------------------------------------------------------ No results for input(s): VITAMINB12, FOLATE, FERRITIN, TIBC, IRON, RETICCTPCT in the last 72 hours.  Coagulation profile No results for input(s): INR, PROTIME in the last 168 hours.  Recent Labs    03/02/19 0100 03/03/19 0555  DDIMER 1.07* 0.99*    Cardiac Enzymes No results for input(s): CKMB, TROPONINI, MYOGLOBIN in the last 168 hours.  Invalid input(s): CK ------------------------------------------------------------------------------------------------------------------    Component Value Date/Time   BNP 11.6 09/15/2017 1041    Micro Results Recent Results (from the past 240 hour(s))  SARS Coronavirus 2 [facility] order, Performed in [facility] hospital lab) Nasopharyngeal Nasopharyngeal Swab     Status: Abnormal   Collection Time: 02/24/19  4:30 PM   Specimen: Nasopharyngeal Swab  Result Value Ref Range Status   SARS Coronavirus 2 POSITIVE (A) NEGATIVE Final    Comment: RESULT CALLED TO, READ BACK BY AND VERIFIED WITH:  HODGES,C RN @2003  ON 02/24/2019 JACKSON,K (NOTE) If result is NEGATIVE SARS-CoV-2 target nucleic acids are NOT DETECTED. The SARS-CoV-2 RNA is generally detectable in upper and lower  respiratory specimens during the acute phase of infection. The lowest  concentration of SARS-CoV-2 viral copies this assay can detect is 250  copies / mL. A negative result does not preclude SARS-CoV-2 infection  and should not be used as the sole basis for treatment or other  patient management decisions.  A negative result may occur with  improper specimen collection / handling, submission of specimen other  than nasopharyngeal swab, presence of viral mutation(s) within the  areas targeted by this assay, and inadequate number of viral copies  (<250 copies / mL). A negative result must be combined with clinical  observations, patient history, and epidemiological information. If result is POSITIVE SARS-CoV-2 target nucleic acids are DETECTED . The SARS-CoV-2 RNA is generally detectable in upper and lower  respiratory specimens during the acute phase of infection.  Positive  results are indicative of active infection with SARS-CoV-2.  Clinical  correlation with patient history and other diagnostic information is  necessary to determine patient infection status.  Positive results do  not rule out bacterial infection or co-infection with other viruses. If result is PRESUMPTIVE POSTIVE SARS-CoV-2 nucleic acids MAY BE PRESENT.   A presumptive positive result was obtained on the submitted specimen  and confirmed on repeat testing.  While 2019 novel coronavirus  (SARS-CoV-2) nucleic acids may be present in the submitted sample  additional confirmatory testing may be necessary for epidemiological  and / or clinical management purposes  to differentiate between  SARS-CoV-2 and other Sarbecovirus currently known to infect humans.  If clinically indicated additional testing with an alternate test  methodology  [phone] ) is advised. The SARS-CoV-2 RNA is generally  detectable in upper and lower respiratory specimens during the acute  phase of infection. The expected result is Negative. Fact Sheet for Patients:  StrictlyIdeas.no Fact Sheet for Healthcare Providers: BankingDealers.co.za This test is not yet approved or cleared by the [place] FDA and has been authorized for detection and/or diagnosis of SARS-CoV-2 by FDA under an Emergency Use Authorization (EUA).  This EUA will remain in effect (meaning this test can be used) for the duration of the COVID-19 declaration under Section 564(b)(1) of  the Act, 21 U.S.C. section 360bbb-3(b)(1), unless the authorization is terminated or revoked sooner. Performed at [facility], [place] [address] [place], [place] [address]   MRSA PCR Screening     Status: None   Collection Time: 02/27/19  8:17 AM   Specimen: Nasal Mucosa; Nasopharyngeal  Result Value Ref Range Status   MRSA by PCR NEGATIVE NEGATIVE Final    Comment:        The GeneXpert MRSA Assay (FDA approved for NASAL specimens only), is one component of a comprehensive MRSA colonization surveillance program. It is not intended to diagnose MRSA infection nor to guide or monitor treatment for MRSA infections. Performed at [facility], [place] [address] [place], [place] [address]     Radiology Reports Portable Chest 1 View  Result Date: 02/25/2019 CLINICAL DATA:  Shortness of breath. EXAM: PORTABLE CHEST 1 VIEW COMPARISON:  Yesterday FINDINGS: Stable bilateral pulmonary infiltrate, greater on the left. Stable cardiomegaly. There has been CABG. No evident edema, effusion, or pneumothorax. IMPRESSION: Stable bilateral pneumonia. Electronically Signed   By: Monte Fantasia M.D.   On: 02/25/2019 04:40   Dg Chest Port 1 View  Result Date: 02/24/2019 CLINICAL DATA:  Shortness of breath.  Covid positive. EXAM:  PORTABLE CHEST 1 VIEW COMPARISON:  11/27/2017 FINDINGS: Previous median sternotomy. Heart size is enlarged. Left mid lung and left base airspace opacities are new from the previous exam compatible with multifocal pneumonia. The right lung appears clear. No edema or effusion. IMPRESSION: 1. Multifocal airspace densities within the left lung compatible with infection. Electronically Signed   By: Kerby Moors M.D.   On: 02/24/2019 17:26

## 2019-03-04 LAB — CBC WITH DIFFERENTIAL/PLATELET
Abs Immature Granulocytes: 0.35 10*3/uL — ABNORMAL HIGH (ref 0.00–0.07)
Basophils Absolute: 0 10*3/uL (ref 0.0–0.1)
Basophils Relative: 0 %
Eosinophils Absolute: 0 10*3/uL (ref 0.0–0.5)
Eosinophils Relative: 0 %
HCT: 46.2 % — ABNORMAL HIGH (ref 36.0–46.0)
Hemoglobin: 15.2 g/dL — ABNORMAL HIGH (ref 12.0–15.0)
Immature Granulocytes: 3 %
Lymphocytes Relative: 13 %
Lymphs Abs: 1.8 10*3/uL (ref 0.7–4.0)
MCH: 30 pg (ref 26.0–34.0)
MCHC: 32.9 g/dL (ref 30.0–36.0)
MCV: 91.3 fL (ref 80.0–100.0)
Monocytes Absolute: 0.8 10*3/uL (ref 0.1–1.0)
Monocytes Relative: 6 %
Neutro Abs: 10.1 10*3/uL — ABNORMAL HIGH (ref 1.7–7.7)
Neutrophils Relative %: 78 %
Platelets: 308 10*3/uL (ref 150–400)
RBC: 5.06 MIL/uL (ref 3.87–5.11)
RDW: 13.2 % (ref 11.5–15.5)
WBC: 13 10*3/uL — ABNORMAL HIGH (ref 4.0–10.5)
nRBC: 0 % (ref 0.0–0.2)

## 2019-03-04 LAB — GLUCOSE, CAPILLARY
Glucose-Capillary: 101 mg/dL — ABNORMAL HIGH (ref 70–99)
Glucose-Capillary: 144 mg/dL — ABNORMAL HIGH (ref 70–99)
Glucose-Capillary: 217 mg/dL — ABNORMAL HIGH (ref 70–99)
Glucose-Capillary: 197 mg/dL — ABNORMAL HIGH (ref 70–99)

## 2019-03-04 LAB — COMPREHENSIVE METABOLIC PANEL
ALT: 45 U/L — ABNORMAL HIGH (ref 0–44)
AST: 24 U/L (ref 15–41)
Albumin: 3.4 g/dL — ABNORMAL LOW (ref 3.5–5.0)
Alkaline Phosphatase: 41 U/L (ref 38–126)
Anion gap: 12 (ref 5–15)
BUN: 46 mg/dL — ABNORMAL HIGH (ref 8–23)
CO2: 23 mmol/L (ref 22–32)
Calcium: 8.7 mg/dL — ABNORMAL LOW (ref 8.9–10.3)
Chloride: 100 mmol/L (ref 98–111)
Creatinine, Ser: 0.93 mg/dL (ref 0.44–1.00)
GFR calc Af Amer: >60 mL/min (ref >60)
GFR calc non Af Amer: >60 mL/min (ref >60)
Glucose, Bld: 128 mg/dL — ABNORMAL HIGH (ref 70–99)
Potassium: 3.7 mmol/L (ref 3.5–5.1)
Sodium: 135 mmol/L (ref 135–145)
Total Bilirubin: 0.6 mg/dL (ref 0.3–1.2)
Total Protein: 6.4 g/dL — ABNORMAL LOW (ref 6.5–8.1)

## 2019-03-04 LAB — D-DIMER, QUANTITATIVE: D-Dimer, Quant: 0.74 ug/mL-FEU — ABNORMAL HIGH (ref 0.00–0.50)

## 2019-03-04 NOTE — Progress Notes (Signed)
Occupational Therapy Evaluation Patient Details Name: [name] MRN: [id number] DOB: [birth year] Today's Date: 03/04/2019    History of Present Illness 73 y.o. female with PMHx of chronic hypoxemic respiratory failure on 2 L of oxygen at home nocturnally, HTN, DM, RA, OSA, history of breast cancer in remission-presented to the hospital with acute hypoxemic respiratory failure due to COVID-19 pneumonia.  Hospital course complicated by worsening hypoxemia requiring transfer to the ICU for close monitoring.  Managed with supportive care-upon further stability transferred back to PCU on 8/9.     Clinical Impression   PTA, pt lived with her daughter and was independent with ADL and mobility. Pt began using a RW/rollator PTA due to weakness "from Covid". Pt seen on 5L O2 with SpO2 ranging from 85 - 92 (poor pleth)  - all VSS without signs of respiratory distress. Pt motivated to regain her strength and return home to her supportive family. Began education on theraband HEP to complete while in her room. Will follow acutely. At this time, recommend [employer] for follow up.     Follow Up Recommendations  Home health OT;Supervision - Intermittent    Equipment Recommendations  None recommended by OT    Recommendations for Other Services       Precautions / Restrictions Precautions Precautions: Fall Precaution Comments: Monitor O2      Mobility Bed Mobility Overal bed mobility: Needs Assistance Bed Mobility: Supine to Sit     Supine to sit: HOB elevated;Supervision     General bed mobility comments: heavy use of bed rails  Transfers Overall transfer level: Needs assistance Equipment used: Rolling walker (2 wheeled) Transfers: Sit to/from Stand Sit to Stand: Min assist;From elevated surface         General transfer comment: initially cpmlains of being dizzy    Balance Overall balance assessment: Needs assistance   Sitting balance-Leahy Scale: Good       Standing balance-Leahy  Scale: Fair                             ADL either performed or assessed with clinical judgement   ADL Overall ADL's : Needs assistance/impaired Eating/Feeding: Independent   Grooming: Set up;Sitting   Upper Body Bathing: Set up;Supervision/ safety;Sitting   Lower Body Bathing: Minimal assistance;Sit to/from stand   Upper Body Dressing : Supervision/safety;Set up;Sitting   Lower Body Dressing: Minimal assistance;Sit to/from stand   Toilet Transfer: Minimal assistance;Ambulation;RW(few steps)   Toileting- Clothing Manipulation and Hygiene: Supervision/safety;Set up;Sit to/from stand       Functional mobility during ADLs: Minimal assistance;Rolling walker General ADL Comments: usually uses lift chair but also ambulates around house and community; difficulty reaching feet; may benefit from AE     Vision Baseline Vision/History: Wears glasses Wears Glasses: Reading only       Perception     Praxis      Pertinent Vitals/Pain Pain Assessment: No/denies pain     Hand Dominance Right   Extremity/Trunk Assessment Upper Extremity Assessment Upper Extremity Assessment: RUE deficits/detail;Generalized weakness RUE Deficits / Details: RTC insufficiency PTA; generalized weakness   Lower Extremity Assessment Lower Extremity Assessment: Defer to PT evaluation   Cervical / Trunk Assessment Cervical / Trunk Assessment: Normal   Communication Communication Communication: HOH   Cognition Arousal/Alertness: Awake/alert Behavior During Therapy: Anxious Overall Cognitive Status: Within Functional Limits for tasks assessed  General Comments  enjoys reading; takes a yearly trip to the beach likes country music  - tim MeGraw and Skeet Simmer    Exercises Exercises: General Upper Extremity;Other exercises General Exercises - Upper Extremity Shoulder Flexion: Strengthening;Both;10 reps;Theraband Theraband Level  (Shoulder Flexion): Level 2 (Red) Shoulder ABduction: Strengthening;Both;10 reps;Theraband Theraband Level (Shoulder Abduction): Level 2 (Red) Other Exercises Other Exercises: chair marching x 20 BLE   Shoulder Instructions      Home Living Family/patient expects to be discharged to:: Private residence Living Arrangements: Children Available Help at Discharge: Family;Available 24 hours/day Type of Home: House Home Access: Stairs to enter [employer] of Steps: 1   Home Layout: One level     Bathroom Shower/Tub: Occupational psychologist: Handicapped height Bathroom Accessibility: Yes How Accessible: Accessible via walker Home Equipment: Grab bars - toilet;Grab bars - tub/shower;Walker - 2 wheels;Bedside commode;Shower seat - built in;Shower seat;Wheelchair - manual;Hand held Tourist information centre manager - 4 wheels          Prior Functioning/Environment Level of Independence: Independent        Comments: recently started using RW since she was weak "with Covid"        OT Problem List: Decreased strength;Decreased activity tolerance;Decreased knowledge of use of DME or AE;Cardiopulmonary status limiting activity;Obesity;Impaired UE functional use      OT Treatment/Interventions: Self-care/ADL training;Therapeutic exercise;Energy conservation;DME and/or AE instruction;Therapeutic activities;Patient/family education    OT Goals(Current goals can be found in the care plan section) Acute Rehab OT Goals Patient Stated Goal: to get back her strength OT Goal Formulation: With patient Time For Goal Achievement: 03/18/19 Potential to Achieve Goals: Good  OT Frequency: Min 3X/week   Barriers to D/C:            Co-evaluation              AM-PAC OT "6 Clicks" Daily Activity     Outcome Measure Help from another person eating meals?: None Help from another person taking care of personal grooming?: A Little Help from another person toileting, which includes  using toliet, bedpan, or urinal?: A Little Help from another person bathing (including washing, rinsing, drying)?: A Little Help from another person to put on and taking off regular upper body clothing?: A Little Help from another person to put on and taking off regular lower body clothing?: A Little 6 Click Score: 19   End of Session Equipment Utilized During Treatment: Gait belt;Rolling walker;Oxygen(5L) Nurse Communication: Mobility status  Activity Tolerance: Patient tolerated treatment well Patient left: in chair;with call bell/phone within reach  OT Visit Diagnosis: Unsteadiness on feet (R26.81);Muscle weakness (generalized) (M62.81)                Time: 6789-3810 OT Time Calculation (min): 50 min Charges:  OT General Charges $OT Visit: 1 Visit OT Evaluation $OT Eval Moderate Complexity: 1 Mod OT Treatments $Self Care/Home Management : 8-22 mins $Therapeutic Exercise: 8-22 mins  Maurie Boettcher, OT/L   Acute OT Clinical Specialist Acute Rehabilitation Services Pager [phone] Office [phone]   [facility] 03/04/2019, 10:27 AM

## 2019-03-04 NOTE — Progress Notes (Signed)
PROGRESS NOTE                                                                                                                                                                                                             Patient Demographics:    [name], is a 73 y.o. female, DOB - [birth year], QIO:[id number]  Outpatient Primary MD for the patient is Crist Infante, MD   Admit date - 02/24/2019   LOS - 8  Chief Complaint  Patient presents with  . Shortness of Breath       Brief Narrative: Patient is a 73 y.o. female with PMHx of chronic hypoxemic respiratory failure on 2 L of oxygen at home nocturnally, HTN, DM, RA, OSA, history of breast cancer in remission-presented to the hospital with acute hypoxemic respiratory failure due to COVID-19 pneumonia.  Hospital course complicated by worsening hypoxemia requiring transfer to the ICU for close monitoring.  Managed with supportive care-upon further stability transferred back to PCU on 8/9.  See below for further details   Subjective:    [name] today is slightly improved-on 6 L of oxygen this morning.  No chest pain.  No nausea vomiting.   Assessment  & Plan :   Acute on chronic hypoxemic (on 2 L of oxygen at nocturnally) resp Failure due to Covid 19 Viral pneumonia: Slowly improving-continue steroids, furosemide (although no volume overload-continue to keep in negative balance).  Continue to mobilize with PT/OT.  Continue to slowly titrate down FiO2  Fever: Afebrile  Oxygen requirement: Down to 6 L high flow (on 8-10 L yesterday)  COVID-19 Labs: Recent Labs    03/02/19 0100 03/03/19 0555 03/04/19 0350  DDIMER 1.07* 0.99* 0.74*  CRP <0.8 <0.8  --     COVID-19 Medications: Steroids: Decadron 8/2>> 8/5, Solu-Medrol 8/5>> 8/8, Decadron 8/8>> Remdesivir: 8/2>> 8/6 Actemra: 8/2 x 1 Convalescent Plasma: N/A Research Studies: N/A  Other medications: Diuretics:  Furosemide Antibiotics: None  DM-2: CBG stable-continue Lantus 16 units twice daily, 6 units of NovoLog with meals and SSI.    HTN: Controlled-continue amlodipine, Avapro and Lasix.    Bronchial asthma: Stable-no wheezing-no signs of active exacerbation-continue bronchodilators  Rheumatoid arthritis: Stable-no signs of flare-continue Plaquenil-follow QTC periodically  GERD: Continue PPI  History of breast cancer-currently in remission  Depression/anxiety: Currently stable-continue Cymbalta, Wellbutrin, BuSpar  Diabetic  peripheral neuropathy: Stable-continue with Neurontin  Chronic hypoxemic respiratory failure on nocturnal O2  OSA: Unable to use CPAP in this facility-use high flow oxygen at night  Deconditioning/debility: Secondary to acute illness-await PT/OT evaluation to determine appropriate resources at discharge  Obesity: Estimated body mass index is 38.94 kg/m as calculated from the following:   Height as of this encounter: 5\' 4"  (1.626 m).   Weight as of this encounter: 102.9 kg.   ABG: No results found for: PHART, PCO2ART, PO2ART, HCO3, TCO2, ACIDBASEDEF, O2SAT  Vent Settings: N/A  Condition - Stable  Family Communication  : Unable to reach patient's daughter again on 8/10-called home number and spoke to a friend who will let daughter know to contact us if any questions or concerns.  Code Status :  Full Code  Diet :  Diet Order            Diet heart healthy/carb modified Room service appropriate? Yes; Fluid consistency: Thin  Diet effective now               Disposition Plan: Remain hospitalized-improving-hopefully home in the next few days with home O2 and home health services.  Consults  :  PCCM  Procedures  :  None  GI prophylaxis: PPI  DVT Prophylaxis  :  Lovenox   Lab Results  Component Value Date   PLT 308 03/04/2019    Antibiotics  :    Anti-infectives (From admission, onward)   Start     Dose/Rate Route Frequency Ordered Stop    02/25/19 2200  remdesivir 100 mg in sodium chloride 0.9 % 250 mL IVPB     100 mg 500 mL/hr over 30 Minutes Intravenous Every 24 hours 02/24/19 2336 02/28/19 2349   02/25/19 1000  doxycycline (VIBRA-TABS) tablet 100 mg  Status:  Discontinued     100 mg Oral 2 times daily 02/24/19 2344 02/25/19 0728   02/25/19 0100  remdesivir 200 mg in sodium chloride 0.9 % 250 mL IVPB     200 mg 500 mL/hr over 30 Minutes Intravenous Once 02/24/19 2336 02/25/19 0149   02/24/19 2330  hydroxychloroquine (PLAQUENIL) tablet 200 mg     200 mg Oral 2 times daily 02/24/19 2040     02/24/19 2200  doxycycline (VIBRA-TABS) tablet 100 mg  Status:  Discontinued     100 mg Oral 2 times daily 02/24/19 2040 02/24/19 2344      Inpatient Medications  Scheduled Meds: . albuterol  2 puff Inhalation BID  . amLODipine  5 mg Oral Daily  . aspirin  81 mg Oral QHS  . baclofen  10 mg Oral BID  . budesonide  1 puff Inhalation BID  . buPROPion  150 mg Oral Daily  . busPIRone  7.5 mg Oral BID  . Chlorhexidine Gluconate Cloth  6 each Topical Q0600  . dexamethasone  6 mg Oral Q breakfast  . DULoxetine  60 mg Oral Daily  . enoxaparin (LOVENOX) injection  40 mg Subcutaneous Q24H  . ezetimibe  10 mg Oral Daily  . fluticasone  2 spray Each Nare BID  . folic acid  2 mg Oral Daily  . furosemide  40 mg Oral Daily  . gabapentin  600 mg Oral TID  . galantamine  16 mg Oral Q breakfast  . hydroxychloroquine  200 mg Oral BID  . insulin aspart  0-20 Units Subcutaneous TID WC  . insulin aspart  0-5 Units Subcutaneous QHS  . insulin aspart  6 Units Subcutaneous TID WC  .  insulin detemir  16 Units Subcutaneous BID  . irbesartan  300 mg Oral Daily  . nebivolol  10 mg Oral Daily  . nystatin  5 mL Oral QID  . pantoprazole  40 mg Oral Daily  . potassium chloride  20 mEq Oral Daily  . sodium chloride flush  10-40 mL Intracatheter Q12H  . sodium chloride flush  3 mL Intravenous Q12H  . triamcinolone cream   Topical BID  . vitamin C  500  mg Oral Daily  . zinc sulfate  220 mg Oral Daily   Continuous Infusions: PRN Meds:.acetaminophen, labetalol, loperamide, LORazepam, prochlorperazine, sodium chloride flush, traMADol, zolpidem   Time Spent in minutes  25  See all Orders from today for further details   Oren Binet M.D on 03/04/2019 at 11:08 AM  To page go to [email or web address] - use universal password  [facility] -  Office  [phone]    Objective:   Vitals:   03/04/19 0200 03/04/19 0300 03/04/19 0400 03/04/19 0744  BP:   115/70 112/70  Pulse: (!) 57 (!) 58 (!) 56 65  Resp: 15 15 16 19   Temp:   (!) 97.5 F (36.4 C) 97.9 F (36.6 C)  TempSrc:   Oral Oral  SpO2: 94% 95% 95% 92%  Weight:      Height:        Wt Readings from Last 3 Encounters:  02/27/19 102.9 kg  01/09/19 111.2 kg  08/07/18 111.6 kg     Intake/Output Summary (Last 24 hours) at 03/04/2019 1108 Last data filed at 03/04/2019 0845 Gross per 24 hour  Intake 880 ml  Output -  Net 880 ml     Physical Exam  Gen Exam:Alert awake-not in any distress HEENT:atraumatic, normocephalic Chest: B/L clear to auscultation anteriorly CVS:S1S2 regular Abdomen:soft non tender, non distended Extremities:no edema Neurology: Non focal Skin: no rash   Data Review:    CBC Recent Labs  Lab 02/27/19 0612 02/28/19 0700 03/01/19 0047 03/03/19 0555 03/04/19 0350  WBC 7.6 10.9* 14.7* 15.4* 13.0*  HGB 15.3* 14.6 14.4 15.2* 15.2*  HCT 47.0* 44.3 43.7 46.8* 46.2*  PLT 250 299 227 317 308  MCV 91.6 90.0 90.3 90.5 91.3  MCH 29.8 29.7 29.8 29.4 30.0  MCHC 32.6 33.0 33.0 32.5 32.9  RDW 12.8 12.3 12.6 12.9 13.2  LYMPHSABS 1.6 1.1 1.0 1.5 1.8  MONOABS 0.4 0.2 0.6 0.8 0.8  EOSABS 0.0 0.0 0.0 0.0 0.0  BASOSABS 0.0 0.0 0.1 0.1 0.0    Chemistries  Recent Labs  Lab 02/26/19 0250 02/27/19 0612 02/28/19 0700 03/01/19 0047 03/02/19 0100 03/03/19 0555 03/04/19 0350  NA 136 138 132* 133*  --  134* 135  K 3.6 3.9 4.3 4.3  --  4.1 3.7  CL  99 105 100 101  --  99 100  CO2 24 24 22 22   --  25 23  GLUCOSE 184* 135* 283* 232*  --  183* 128*  BUN 20 24* 21 22  --  39* 46*  CREATININE 0.52 0.63 0.57 0.60  --  0.70 0.93  CALCIUM 9.2 8.8* 8.5* 8.6*  --  8.7* 8.7*  MG  --   --  2.1 2.0 2.1 2.3  --   AST 27 47* 30 25  --   --  24  ALT 22 37 41 36  --   --  45*  ALKPHOS 51 49 50 46  --   --  41  BILITOT 0.5 0.8 0.6 0.7  --   --  0.6   ------------------------------------------------------------------------------------------------------------------ No results for input(s): CHOL, HDL, LDLCALC, TRIG, CHOLHDL, LDLDIRECT in the last 72 hours.  Lab Results  Component Value Date   HGBA1C 8.1 (H) 02/26/2019   ------------------------------------------------------------------------------------------------------------------ No results for input(s): TSH, T4TOTAL, T3FREE, THYROIDAB in the last 72 hours.  Invalid input(s): FREET3 ------------------------------------------------------------------------------------------------------------------ No results for input(s): VITAMINB12, FOLATE, FERRITIN, TIBC, IRON, RETICCTPCT in the last 72 hours.  Coagulation profile No results for input(s): INR, PROTIME in the last 168 hours.  Recent Labs    03/03/19 0555 03/04/19 0350  DDIMER 0.99* 0.74*    Cardiac Enzymes No results for input(s): CKMB, TROPONINI, MYOGLOBIN in the last 168 hours.  Invalid input(s): CK ------------------------------------------------------------------------------------------------------------------    Component Value Date/Time   BNP 11.6 09/15/2017 1041    Micro Results Recent Results (from the past 240 hour(s))  SARS Coronavirus 2 [facility] order, Performed in [facility] hospital lab) Nasopharyngeal Nasopharyngeal Swab     Status: Abnormal   Collection Time: 02/24/19  4:30 PM   Specimen: Nasopharyngeal Swab  Result Value Ref Range Status   SARS Coronavirus 2 POSITIVE (A) NEGATIVE Final    Comment: RESULT  CALLED TO, READ BACK BY AND VERIFIED WITH: HODGES,C RN @2003  ON 02/24/2019 JACKSON,K (NOTE) If result is NEGATIVE SARS-CoV-2 target nucleic acids are NOT DETECTED. The SARS-CoV-2 RNA is generally detectable in upper and lower  respiratory specimens during the acute phase of infection. The lowest  concentration of SARS-CoV-2 viral copies this assay can detect is 250  copies / mL. A negative result does not preclude SARS-CoV-2 infection  and should not be used as the sole basis for treatment or other  patient management decisions.  A negative result may occur with  improper specimen collection / handling, submission of specimen other  than nasopharyngeal swab, presence of viral mutation(s) within the  areas targeted by this assay, and inadequate number of viral copies  (<250 copies / mL). A negative result must be combined with clinical  observations, patient history, and epidemiological information. If result is POSITIVE SARS-CoV-2 target nucleic acids are DETECTED . The SARS-CoV-2 RNA is generally detectable in upper and lower  respiratory specimens during the acute phase of infection.  Positive  results are indicative of active infection with SARS-CoV-2.  Clinical  correlation with patient history and other diagnostic information is  necessary to determine patient infection status.  Positive results do  not rule out bacterial infection or co-infection with other viruses. If result is PRESUMPTIVE POSTIVE SARS-CoV-2 nucleic acids MAY BE PRESENT.   A presumptive positive result was obtained on the submitted specimen  and confirmed on repeat testing.  While 2019 novel coronavirus  (SARS-CoV-2) nucleic acids may be present in the submitted sample  additional confirmatory testing may be necessary for epidemiological  and / or clinical management purposes  to differentiate between  SARS-CoV-2 and other Sarbecovirus currently known to infect humans.  If clinically indicated additional testing  with an alternate test  methodology [phone] ) is advised. The SARS-CoV-2 RNA is generally  detectable in upper and lower respiratory specimens during the acute  phase of infection. The expected result is Negative. Fact Sheet for Patients:  StrictlyIdeas.no Fact Sheet for Healthcare Providers: BankingDealers.co.za This test is not yet approved or cleared by the [place] FDA and has been authorized for detection and/or diagnosis of SARS-CoV-2 by FDA under an Emergency Use Authorization (EUA).  This EUA will remain in effect (meaning this test can be used) for the duration of the COVID-19 declaration  under Section 564(b)(1) of the Act, 21 U.S.C. section 360bbb-3(b)(1), unless the authorization is terminated or revoked sooner. Performed at [facility], [place] [address], [place], [place] [address]   MRSA PCR Screening     Status: None   Collection Time: 02/27/19  8:17 AM   Specimen: Nasal Mucosa; Nasopharyngeal  Result Value Ref Range Status   MRSA by PCR NEGATIVE NEGATIVE Final    Comment:        The GeneXpert MRSA Assay (FDA approved for NASAL specimens only), is one component of a comprehensive MRSA colonization surveillance program. It is not intended to diagnose MRSA infection nor to guide or monitor treatment for MRSA infections. Performed at [facility], [place] [address] [place],  [address]     Radiology Reports Portable Chest 1 View  Result Date: 02/25/2019 CLINICAL DATA:  Shortness of breath. EXAM: PORTABLE CHEST 1 VIEW COMPARISON:  Yesterday FINDINGS: Stable bilateral pulmonary infiltrate, greater on the left. Stable cardiomegaly. There has been CABG. No evident edema, effusion, or pneumothorax. IMPRESSION: Stable bilateral pneumonia. Electronically Signed   By: Monte Fantasia M.D.   On: 02/25/2019 04:40   Dg Chest Port 1 View  Result Date: 02/24/2019 CLINICAL DATA:   Shortness of breath.  Covid positive. EXAM: PORTABLE CHEST 1 VIEW COMPARISON:  11/27/2017 FINDINGS: Previous median sternotomy. Heart size is enlarged. Left mid lung and left base airspace opacities are new from the previous exam compatible with multifocal pneumonia. The right lung appears clear. No edema or effusion. IMPRESSION: 1. Multifocal airspace densities within the left lung compatible with infection. Electronically Signed   By: Kerby Moors M.D.   On: 02/24/2019 17:26

## 2019-03-04 NOTE — Evaluation (Signed)
Physical Therapy Evaluation Patient Details Name: [name] MRN: [id number] DOB: 1945-11-03 Today's Date: 03/04/2019   History of Present Illness  73 y.o. female with PMHx of chronic hypoxemic respiratory failure on 2 L of oxygen at home nocturnally, HTN, DM, RA, OSA, history of breast cancer in remission-presented to the hospital with acute hypoxemic respiratory failure due to COVID-19 pneumonia.  Hospital course complicated by worsening hypoxemia requiring transfer to the ICU for close monitoring.  Managed with supportive care-upon further stability transferred back to PCU on 8/9.    Clinical Impression  The patient is  On 5 L [place], sao2 >90% during mobility. Patient ambulated x 8', complains of dizziness. BP93/55. After resting 116/99. Sao2 92% HR 68. Patient reports that she passes out easily. Pt admitted with above diagnosis. Pt currently with functional limitations due to the deficits listed below (see PT Problem List). Pt will benefit from skilled PT to increase their independence and safety with mobility to allow discharge to the venue listed below.       Follow Up Recommendations Home health PT    Equipment Recommendations  None recommended by PT    Recommendations for Other Services       Precautions / Restrictions Precautions Precaution Comments: Monitor O2, and BP, orthostatic. pt states" I am easy to pass out"      Mobility  Bed Mobility               General bed mobility comments: OOB  Transfers   Equipment used: Rolling walker (2 wheeled) Transfers: Sit to/from Stand Sit to Stand: Min assist;From elevated surface         General transfer comment: initially cmlains of being dizzy  Ambulation/Gait Ambulation/Gait assistance: Min Web designer (Feet): 8 Feet Assistive device: Rolling walker (2 wheeled) Gait Pattern/deviations: Step-to pattern;Decreased step length - left;Decreased step length - right;Shuffle     General Gait Details: very  slow shuffling steps, pt. c/o dizziness. recliner brought up.  Stairs            Wheelchair Mobility    Modified Rankin (Stroke Patients Only)       Balance Overall balance assessment: Needs assistance   Sitting balance-Leahy Scale: Good     Standing balance support: Bilateral upper extremity supported;During functional activity Standing balance-Leahy Scale: Fair                               Pertinent Vitals/Pain Pain Assessment: No/denies pain    Home Living Family/patient expects to be discharged to:: Private residence Living Arrangements: Children Available Help at Discharge: Family;Available 24 hours/day Type of Home: House Home Access: Stairs to enter   [employer] of Steps: 1 Home Layout: One level Home Equipment: Grab bars - toilet;Grab bars - tub/shower;Walker - 2 wheels;Bedside commode;Shower seat - built in;Shower seat;Wheelchair - manual;Hand held Tourist information centre manager - 4 wheels      Prior Function Level of Independence: Independent with assistive device(s)         Comments: recently started using RW since she was weak "with Covid"     Hand Dominance        Extremity/Trunk Assessment   Upper Extremity Assessment Upper Extremity Assessment: Defer to OT evaluation RUE Deficits / Details: RTC insufficiency PTA; generalized weakness    Lower Extremity Assessment Lower Extremity Assessment: Generalized weakness    Cervical / Trunk Assessment Cervical / Trunk Assessment: Normal  Communication   Communication: [facility]  Cognition Arousal/Alertness: Awake/alert Behavior During Therapy: Anxious Overall Cognitive Status: Within Functional Limits for tasks assessed                                        General Comments      Exercises     Assessment/Plan    PT Assessment Patient needs continued PT services  PT Problem List Decreased strength;Decreased mobility;Decreased activity  tolerance;Cardiopulmonary status limiting activity;Decreased balance;Decreased knowledge of use of DME       PT Treatment Interventions DME instruction;Therapeutic activities;Gait training;Therapeutic exercise;Functional mobility training;Patient/family education    PT Goals (Current goals can be found in the Care Plan section)  Acute Rehab PT Goals Patient Stated Goal: to get back her strength PT Goal Formulation: With patient Time For Goal Achievement: 03/18/19 Potential to Achieve Goals: Good    Frequency Min 3X/week   Barriers to discharge        Co-evaluation               AM-PAC PT "6 Clicks" Mobility  Outcome Measure Help needed turning from your back to your side while in a flat bed without using bedrails?: A Lot Help needed moving from lying on your back to sitting on the side of a flat bed without using bedrails?: A Lot Help needed moving to and from a bed to a chair (including a wheelchair)?: A Lot Help needed standing up from a chair using your arms (e.g., wheelchair or bedside chair)?: A Lot Help needed to walk in hospital room?: A Lot Help needed climbing 3-5 steps with a railing? : Total 6 Click Score: 11    End of Session Equipment Utilized During Treatment: Oxygen Activity Tolerance: Treatment limited secondary to medical complications (Comment) Patient left: in chair;with call bell/phone within reach Nurse Communication: Mobility status PT Visit Diagnosis: Difficulty in walking, not elsewhere classified (R26.2)    Time: 0350-0938 PT Time Calculation (min) (ACUTE ONLY): 40 min   Charges:   PT Evaluation $PT Eval Moderate Complexity: 1 Mod PT Treatments $Gait Training: 23-37 mins        [place]  Office [phone]   [name] 03/04/2019, 2:18 PM

## 2019-03-05 LAB — COMPREHENSIVE METABOLIC PANEL
ALT: 41 U/L (ref 0–44)
AST: 25 U/L (ref 15–41)
Albumin: 3.3 g/dL — ABNORMAL LOW (ref 3.5–5.0)
Alkaline Phosphatase: 39 U/L (ref 38–126)
Anion gap: 16 — ABNORMAL HIGH (ref 5–15)
BUN: 45 mg/dL — ABNORMAL HIGH (ref 8–23)
CO2: 19 mmol/L — ABNORMAL LOW (ref 22–32)
Calcium: 8.8 mg/dL — ABNORMAL LOW (ref 8.9–10.3)
Chloride: 99 mmol/L (ref 98–111)
Creatinine, Ser: 0.82 mg/dL (ref 0.44–1.00)
GFR calc Af Amer: >60 mL/min (ref >60)
GFR calc non Af Amer: >60 mL/min (ref >60)
Glucose, Bld: 114 mg/dL — ABNORMAL HIGH (ref 70–99)
Potassium: 4.6 mmol/L (ref 3.5–5.1)
Sodium: 134 mmol/L — ABNORMAL LOW (ref 135–145)
Total Bilirubin: 0.6 mg/dL (ref 0.3–1.2)
Total Protein: 6.1 g/dL — ABNORMAL LOW (ref 6.5–8.1)

## 2019-03-05 LAB — CBC WITH DIFFERENTIAL/PLATELET
Abs Immature Granulocytes: 0.16 10*3/uL — ABNORMAL HIGH (ref 0.00–0.07)
Basophils Absolute: 0 10*3/uL (ref 0.0–0.1)
Basophils Relative: 0 %
Eosinophils Absolute: 0.1 10*3/uL (ref 0.0–0.5)
Eosinophils Relative: 1 %
HCT: 47.9 % — ABNORMAL HIGH (ref 36.0–46.0)
Hemoglobin: 15.5 g/dL — ABNORMAL HIGH (ref 12.0–15.0)
Immature Granulocytes: 2 %
Lymphocytes Relative: 23 %
Lymphs Abs: 2.3 10*3/uL (ref 0.7–4.0)
MCH: 29.8 pg (ref 26.0–34.0)
MCHC: 32.4 g/dL (ref 30.0–36.0)
MCV: 91.9 fL (ref 80.0–100.0)
Monocytes Absolute: 0.8 10*3/uL (ref 0.1–1.0)
Monocytes Relative: 8 %
Neutro Abs: 6.9 10*3/uL (ref 1.7–7.7)
Neutrophils Relative %: 66 %
Platelets: 269 10*3/uL (ref 150–400)
RBC: 5.21 MIL/uL — ABNORMAL HIGH (ref 3.87–5.11)
RDW: 13.4 % (ref 11.5–15.5)
WBC: 10.3 10*3/uL (ref 4.0–10.5)
nRBC: 0 % (ref 0.0–0.2)

## 2019-03-05 LAB — GLUCOSE, CAPILLARY
Glucose-Capillary: 124 mg/dL — ABNORMAL HIGH (ref 70–99)
Glucose-Capillary: 181 mg/dL — ABNORMAL HIGH (ref 70–99)
Glucose-Capillary: 219 mg/dL — ABNORMAL HIGH (ref 70–99)
Glucose-Capillary: 198 mg/dL — ABNORMAL HIGH (ref 70–99)

## 2019-03-05 LAB — D-DIMER, QUANTITATIVE: D-Dimer, Quant: 1 ug/mL-FEU — ABNORMAL HIGH (ref 0.00–0.50)

## 2019-03-05 NOTE — Progress Notes (Signed)
Called and spoke with daughter, Lynelle Smoke.  Her sister, Lattie Haw, works during the day and cannot take phone calls.  Tammy is asking that doctor call her with update on mother's condition at [phone].  Update daughter on progress thus far.  She will be the one staying with patient once she goes home.  She just had rotator cuff repair and is worried about how much assistance the patient will need.  I review physical therapy's recommendations.  The have home health equipment at home: gait belt, walker, BSC, etc.  Also, she is worried about how low her mother's blood pressures are.  They are usually more elevated and she feels that this may be the reason why patient is feeling dizzy.  I advised that we would let MD know tomorrow.  Earleen Reaper RN

## 2019-03-05 NOTE — Progress Notes (Signed)
PROGRESS NOTE                                                                                                                                                                                                             Patient Demographics:    [name], is a 73 y.o. female, DOB - [birth year], UXL:[id number]  Outpatient Primary MD for the patient is Crist Infante, MD   Admit date - 02/24/2019   LOS - 9  Chief Complaint  Patient presents with  . Shortness of Breath       Brief Narrative: Patient is a 73 y.o. female with PMHx of chronic hypoxemic respiratory failure on 2 L of oxygen at home nocturnally, HTN, DM, RA, OSA, history of breast cancer in remission-presented to the hospital with acute hypoxemic respiratory failure due to COVID-19 pneumonia.  Hospital course complicated by worsening hypoxemia requiring transfer to the ICU for close monitoring.  Managed with supportive care-upon further stability transferred back to PCU on 8/9.  See below for further details   Subjective:    [name] today has no major complaints-she continues to improve-oxygen requirements are around 4 L this morning.   Assessment  & Plan :   Acute on chronic hypoxemic (on 2 L of oxygen at nocturnally) resp Failure due to Covid 19 Viral pneumonia: Continues to improve with decreasing oxygen requirements.  Continue steroids, continue attempts at mobilization.  Suspect if improvement continues-home later this week.  Will need to assess for home O2 requirement prior to discharge  Fever: Afebrile  Oxygen requirement: Down to 4 L high flow (on 6 L yesterday)  COVID-19 Labs: Recent Labs    03/03/19 0555 03/04/19 0350 03/05/19 0500  DDIMER 0.99* 0.74* 1.00*  CRP <0.8  --   --     COVID-19 Medications: Steroids: Decadron 8/2>> 8/5, Solu-Medrol 8/5>> 8/8, Decadron 8/8>> Remdesivir: 8/2>> 8/6 Actemra: 8/2 x 1 Convalescent Plasma: N/A Research  Studies: N/A  Other medications: Diuretics: Furosemide Antibiotics: None  DM-2 (A1c 8.1 on 02/26/2019): CBG stable-continue Lantus 16 units twice daily, 6 units of NovoLog with meals and SSI.    HTN: Controlled-continue amlodipine, Avapro and Lasix.    Bronchial asthma: Stable-no wheezing-no signs of active exacerbation-continue bronchodilators  Rheumatoid arthritis: Stable-no signs of flare-continue Plaquenil-follow QTC periodically  GERD: Continue PPI  History of breast cancer-currently  in remission  Depression/anxiety: Currently stable-continue Cymbalta, Wellbutrin, BuSpar  Diabetic peripheral neuropathy: Stable-continue with Neurontin  Chronic hypoxemic respiratory failure on nocturnal O2  OSA: Unable to use CPAP in this facility-use high flow oxygen at night  Deconditioning/debility: Secondary to acute illness-await PT/OT evaluation to determine appropriate resources at discharge  Obesity: Estimated body mass index is 38.94 kg/m as calculated from the following:   Height as of this encounter: 5\' 4"  (1.626 m).   Weight as of this encounter: 102.9 kg.   ABG: No results found for: PHART, PCO2ART, PO2ART, HCO3, TCO2, ACIDBASEDEF, O2SAT  Vent Settings: N/A  Condition - Stable  Family Communication  : Unable to reach patient's daughter again on 8/10-called home number and spoke to a friend who will let daughter know to contact us if any questions or concerns.  Code Status :  Full Code  Diet :  Diet Order            Diet heart healthy/carb modified Room service appropriate? Yes; Fluid consistency: Thin  Diet effective now               Disposition Plan: Remain hospitalized-improving-hopefully home in the next few days with home O2 and home health services.  Consults  :  PCCM  Procedures  :  None  GI prophylaxis: PPI  DVT Prophylaxis  :  Lovenox   Lab Results  Component Value Date   PLT 308 03/04/2019    Antibiotics  :    Anti-infectives (From  admission, onward)   Start     Dose/Rate Route Frequency Ordered Stop   02/25/19 2200  remdesivir 100 mg in sodium chloride 0.9 % 250 mL IVPB     100 mg 500 mL/hr over 30 Minutes Intravenous Every 24 hours 02/24/19 2336 02/28/19 2349   02/25/19 1000  doxycycline (VIBRA-TABS) tablet 100 mg  Status:  Discontinued     100 mg Oral 2 times daily 02/24/19 2344 02/25/19 0728   02/25/19 0100  remdesivir 200 mg in sodium chloride 0.9 % 250 mL IVPB     200 mg 500 mL/hr over 30 Minutes Intravenous Once 02/24/19 2336 02/25/19 0149   02/24/19 2330  hydroxychloroquine (PLAQUENIL) tablet 200 mg     200 mg Oral 2 times daily 02/24/19 2040     02/24/19 2200  doxycycline (VIBRA-TABS) tablet 100 mg  Status:  Discontinued     100 mg Oral 2 times daily 02/24/19 2040 02/24/19 2344      Inpatient Medications  Scheduled Meds: . albuterol  2 puff Inhalation BID  . amLODipine  5 mg Oral Daily  . aspirin  81 mg Oral QHS  . baclofen  10 mg Oral BID  . budesonide  1 puff Inhalation BID  . buPROPion  150 mg Oral Daily  . busPIRone  7.5 mg Oral BID  . Chlorhexidine Gluconate Cloth  6 each Topical Q0600  . dexamethasone  6 mg Oral Q breakfast  . DULoxetine  60 mg Oral Daily  . enoxaparin (LOVENOX) injection  40 mg Subcutaneous Q24H  . ezetimibe  10 mg Oral Daily  . fluticasone  2 spray Each Nare BID  . folic acid  2 mg Oral Daily  . furosemide  40 mg Oral Daily  . gabapentin  600 mg Oral TID  . galantamine  16 mg Oral Q breakfast  . hydroxychloroquine  200 mg Oral BID  . insulin aspart  0-20 Units Subcutaneous TID WC  . insulin aspart  0-5 Units Subcutaneous  QHS  . insulin aspart  6 Units Subcutaneous TID WC  . insulin detemir  16 Units Subcutaneous BID  . irbesartan  300 mg Oral Daily  . nebivolol  10 mg Oral Daily  . nystatin  5 mL Oral QID  . pantoprazole  40 mg Oral Daily  . potassium chloride  20 mEq Oral Daily  . sodium chloride flush  10-40 mL Intracatheter Q12H  . sodium chloride flush  3 mL  Intravenous Q12H  . triamcinolone cream   Topical BID  . vitamin C  500 mg Oral Daily  . zinc sulfate  220 mg Oral Daily   Continuous Infusions: PRN Meds:.acetaminophen, labetalol, loperamide, LORazepam, prochlorperazine, sodium chloride flush, traMADol, zolpidem   Time Spent in minutes  25  See all Orders from today for further details   Oren Binet M.D on 03/05/2019 at 10:19 AM  To page go to [email or web address] - use universal password  [facility] -  Office  [phone]    Objective:   Vitals:   03/05/19 0200 03/05/19 0300 03/05/19 0353 03/05/19 0741  BP:   107/64   Pulse: (!) 57 (!) 55 (!) 58   Resp: 15 14 17    Temp:   (!) 97.4 F (36.3 C) (!) 97.5 F (36.4 C)  TempSrc:   Oral Oral  SpO2: 95% 96% 94%   Weight:      Height:        Wt Readings from Last 3 Encounters:  02/27/19 102.9 kg  01/09/19 111.2 kg  08/07/18 111.6 kg     Intake/Output Summary (Last 24 hours) at 03/05/2019 1019 Last data filed at 03/05/2019 1000 Gross per 24 hour  Intake 1920 ml  Output -  Net 1920 ml     Physical Exam Gen Exam:Alert awake-not in any distress HEENT:atraumatic, normocephalic Chest: Fine bibasilar rales CVS:S1S2 regular Abdomen:soft non tender, non distended Extremities:no edema Neurology: Non focal Skin: no rash   Data Review:    CBC Recent Labs  Lab 02/27/19 0612 02/28/19 0700 03/01/19 0047 03/03/19 0555 03/04/19 0350  WBC 7.6 10.9* 14.7* 15.4* 13.0*  HGB 15.3* 14.6 14.4 15.2* 15.2*  HCT 47.0* 44.3 43.7 46.8* 46.2*  PLT 250 299 227 317 308  MCV 91.6 90.0 90.3 90.5 91.3  MCH 29.8 29.7 29.8 29.4 30.0  MCHC 32.6 33.0 33.0 32.5 32.9  RDW 12.8 12.3 12.6 12.9 13.2  LYMPHSABS 1.6 1.1 1.0 1.5 1.8  MONOABS 0.4 0.2 0.6 0.8 0.8  EOSABS 0.0 0.0 0.0 0.0 0.0  BASOSABS 0.0 0.0 0.1 0.1 0.0    Chemistries  Recent Labs  Lab 02/27/19 0612 02/28/19 0700 03/01/19 0047 03/02/19 0100 03/03/19 0555 03/04/19 0350 03/05/19 0500  NA 138 132* 133*  --   134* 135 134*  K 3.9 4.3 4.3  --  4.1 3.7 4.6  CL 105 100 101  --  99 100 99  CO2 24 22 22   --  25 23 19*  GLUCOSE 135* 283* 232*  --  183* 128* 114*  BUN 24* 21 22  --  39* 46* 45*  CREATININE 0.63 0.57 0.60  --  0.70 0.93 0.82  CALCIUM 8.8* 8.5* 8.6*  --  8.7* 8.7* 8.8*  MG  --  2.1 2.0 2.1 2.3  --   --   AST 47* 30 25  --   --  24 25  ALT 37 41 36  --   --  45* 41  ALKPHOS 49 50 46  --   --  41 39  BILITOT 0.8 0.6 0.7  --   --  0.6 0.6   ------------------------------------------------------------------------------------------------------------------ No results for input(s): CHOL, HDL, LDLCALC, TRIG, CHOLHDL, LDLDIRECT in the last 72 hours.  Lab Results  Component Value Date   HGBA1C 8.1 (H) 02/26/2019   ------------------------------------------------------------------------------------------------------------------ No results for input(s): TSH, T4TOTAL, T3FREE, THYROIDAB in the last 72 hours.  Invalid input(s): FREET3 ------------------------------------------------------------------------------------------------------------------ No results for input(s): VITAMINB12, FOLATE, FERRITIN, TIBC, IRON, RETICCTPCT in the last 72 hours.  Coagulation profile No results for input(s): INR, PROTIME in the last 168 hours.  Recent Labs    03/04/19 0350 03/05/19 0500  DDIMER 0.74* 1.00*    Cardiac Enzymes No results for input(s): CKMB, TROPONINI, MYOGLOBIN in the last 168 hours.  Invalid input(s): CK ------------------------------------------------------------------------------------------------------------------    Component Value Date/Time   BNP 11.6 09/15/2017 1041    Micro Results Recent Results (from the past 240 hour(s))  SARS Coronavirus 2 [facility] order, Performed in [facility] hospital lab) Nasopharyngeal Nasopharyngeal Swab     Status: Abnormal   Collection Time: 02/24/19  4:30 PM   Specimen: Nasopharyngeal Swab  Result Value Ref Range Status   SARS Coronavirus  2 POSITIVE (A) NEGATIVE Final    Comment: RESULT CALLED TO, READ BACK BY AND VERIFIED WITH: HODGES,C RN @2003  ON 02/24/2019 JACKSON,K (NOTE) If result is NEGATIVE SARS-CoV-2 target nucleic acids are NOT DETECTED. The SARS-CoV-2 RNA is generally detectable in upper and lower  respiratory specimens during the acute phase of infection. The lowest  concentration of SARS-CoV-2 viral copies this assay can detect is 250  copies / mL. A negative result does not preclude SARS-CoV-2 infection  and should not be used as the sole basis for treatment or other  patient management decisions.  A negative result may occur with  improper specimen collection / handling, submission of specimen other  than nasopharyngeal swab, presence of viral mutation(s) within the  areas targeted by this assay, and inadequate number of viral copies  (<250 copies / mL). A negative result must be combined with clinical  observations, patient history, and epidemiological information. If result is POSITIVE SARS-CoV-2 target nucleic acids are DETECTED . The SARS-CoV-2 RNA is generally detectable in upper and lower  respiratory specimens during the acute phase of infection.  Positive  results are indicative of active infection with SARS-CoV-2.  Clinical  correlation with patient history and other diagnostic information is  necessary to determine patient infection status.  Positive results do  not rule out bacterial infection or co-infection with other viruses. If result is PRESUMPTIVE POSTIVE SARS-CoV-2 nucleic acids MAY BE PRESENT.   A presumptive positive result was obtained on the submitted specimen  and confirmed on repeat testing.  While 2019 novel coronavirus  (SARS-CoV-2) nucleic acids may be present in the submitted sample  additional confirmatory testing may be necessary for epidemiological  and / or clinical management purposes  to differentiate between  SARS-CoV-2 and other Sarbecovirus currently known to infect  humans.  If clinically indicated additional testing with an alternate test  methodology [phone] ) is advised. The SARS-CoV-2 RNA is generally  detectable in upper and lower respiratory specimens during the acute  phase of infection. The expected result is Negative. Fact Sheet for Patients:  StrictlyIdeas.no Fact Sheet for Healthcare Providers: BankingDealers.co.za This test is not yet approved or cleared by the [place] FDA and has been authorized for detection and/or diagnosis of SARS-CoV-2 by FDA under an Emergency Use Authorization (EUA).  This EUA will remain in  effect (meaning this test can be used) for the duration of the COVID-19 declaration under Section 564(b)(1) of the Act, 21 U.S.C. section 360bbb-3(b)(1), unless the authorization is terminated or revoked sooner. Performed at [facility], [place] [address], [place], [place] [address]   MRSA PCR Screening     Status: None   Collection Time: 02/27/19  8:17 AM   Specimen: Nasal Mucosa; Nasopharyngeal  Result Value Ref Range Status   MRSA by PCR NEGATIVE NEGATIVE Final    Comment:        The GeneXpert MRSA Assay (FDA approved for NASAL specimens only), is one component of a comprehensive MRSA colonization surveillance program. It is not intended to diagnose MRSA infection nor to guide or monitor treatment for MRSA infections. Performed at [facility], [place] [address] [place], [place] [address]     Radiology Reports Portable Chest 1 View  Result Date: 02/25/2019 CLINICAL DATA:  Shortness of breath. EXAM: PORTABLE CHEST 1 VIEW COMPARISON:  Yesterday FINDINGS: Stable bilateral pulmonary infiltrate, greater on the left. Stable cardiomegaly. There has been CABG. No evident edema, effusion, or pneumothorax. IMPRESSION: Stable bilateral pneumonia. Electronically Signed   By: Monte Fantasia M.D.   On: 02/25/2019 04:40   Dg Chest Port 1  View  Result Date: 02/24/2019 CLINICAL DATA:  Shortness of breath.  Covid positive. EXAM: PORTABLE CHEST 1 VIEW COMPARISON:  11/27/2017 FINDINGS: Previous median sternotomy. Heart size is enlarged. Left mid lung and left base airspace opacities are new from the previous exam compatible with multifocal pneumonia. The right lung appears clear. No edema or effusion. IMPRESSION: 1. Multifocal airspace densities within the left lung compatible with infection. Electronically Signed   By: Kerby Moors M.D.   On: 02/24/2019 17:26

## 2019-03-05 NOTE — Progress Notes (Signed)
Occupational Therapy Treatment Patient Details Name: [name] MRN: [id number] DOB: 17-Apr-1946 Today's Date: 03/05/2019    History of present illness 73 y.o. female with PMHx of chronic hypoxemic respiratory failure on 2 L of oxygen at home nocturnally, HTN, DM, RA, OSA, history of breast cancer in remission-presented to the hospital with acute hypoxemic respiratory failure due to COVID-19 pneumonia.  Hospital course complicated by worsening hypoxemia requiring transfer to the ICU for close monitoring.  Managed with supportive care-upon further stability transferred back to PCU on 8/9.     OT comments  Pt progressing towards established OT goals. Pt reporting dizziness in standing - BP 100/41. Pt requiring Min Guard A and RW for functional mobility. Pt performing mobility to/from door with increased time and standing rest breaks. SpO2 dropping to 83% on 3L requiring rest breaks to elevate back to 90%. Pt continues to present with significant fatigue. Continue to recommend dc to home with HHOT and will continue to follow acutely as admitted.    Follow Up Recommendations  Home health OT;Supervision - Intermittent    Equipment Recommendations  None recommended by OT    Recommendations for Other Services      Precautions / Restrictions Precautions Precautions: Fall Precaution Comments: Monitor O2, and BP, orthostatic. pt states" I am easy to pass out" Restrictions Weight Bearing Restrictions: No       Mobility Bed Mobility               General bed mobility comments: In recliner upon arrival  Transfers Overall transfer level: Needs assistance Equipment used: Rolling walker (2 wheeled) Transfers: Sit to/from Stand Sit to Stand: Min guard         General transfer comment: Min Guard A for safety in standing. Requiring increased time and effort    Balance Overall balance assessment: Needs assistance   Sitting balance-Leahy Scale: Good     Standing balance support:  Bilateral upper extremity supported;During functional activity Standing balance-Leahy Scale: Fair                             ADL either performed or assessed with clinical judgement   ADL Overall ADL's : Needs assistance/impaired                         Toilet Transfer: Ambulation;RW;Min guard(Simulated to recliner) Toilet Transfer Details (indicate cue type and reason): Pt requiring Min Guard A for sit<>stand          Functional mobility during ADLs: Min guard;Rolling walker(to/from door) General ADL Comments: Pt performing functional mobility to/from door with several standing rest breaks. Pt SpO2 dropping to 83% on 3L O2 with activity; standing rest breaks for elevaing SpO2 back to 90s on 3L     Vision       Perception     Praxis      Cognition Arousal/Alertness: Awake/alert Behavior During Therapy: [facility] for tasks assessed/performed;Anxious Overall Cognitive Status: Within Functional Limits for tasks assessed                                          Exercises     Shoulder Instructions       General Comments Pt reporting dizziness with standing. BP in standing (left forearm) 100/41. BP sitting after mobility 102/55    Pertinent Vitals/ Pain  Pain Assessment: No/denies pain  Home Living                                          Prior Functioning/Environment              Frequency  Min 3X/week        Progress Toward Goals  OT Goals(current goals can now be found in the care plan section)  Progress towards OT goals: Progressing toward goals  Acute Rehab OT Goals Patient Stated Goal: to get back her strength OT Goal Formulation: With patient Time For Goal Achievement: 03/18/19 Potential to Achieve Goals: Good ADL Goals Pt Will Perform Lower Body Bathing: with modified independence;sit to/from stand;with adaptive equipment Pt Will Perform Lower Body Dressing: with modified  independence;sit to/from stand;with adaptive equipment Pt Will Transfer to Toilet: with modified independence;ambulating;bedside commode Pt Will Perform Toileting - Clothing Manipulation and hygiene: with modified independence;sit to/from stand Pt/caregiver will Perform Home Exercise Program: Independently;Increased strength;Both right and left upper extremity;With written HEP provided;With theraband Additional ADL Goal #1: Pt will independently verbalize 3 energy conservation strategies Additional ADL Goal #2: Pt will complete ADL task with SpO2 maintaining greater than 90  Plan Discharge plan remains appropriate    Co-evaluation                 AM-PAC OT "6 Clicks" Daily Activity     Outcome Measure   Help from another person eating meals?: None Help from another person taking care of personal grooming?: A Little Help from another person toileting, which includes using toliet, bedpan, or urinal?: A Little Help from another person bathing (including washing, rinsing, drying)?: A Little Help from another person to put on and taking off regular upper body clothing?: A Little Help from another person to put on and taking off regular lower body clothing?: A Little 6 Click Score: 19    End of Session Equipment Utilized During Treatment: Rolling walker;Oxygen(3L)  OT Visit Diagnosis: Unsteadiness on feet (R26.81);Muscle weakness (generalized) (M62.81)   Activity Tolerance Patient tolerated treatment well   Patient Left in chair;with call bell/phone within reach   Nurse Communication Mobility status        Time: 0923-3007 OT Time Calculation (min): 27 min  Charges: OT General Charges $OT Visit: 1 Visit OT Treatments $Self Care/Home Management : 23-37 mins  [place], OTR/L Acute Rehab Pager: [phone] Office: [place] 03/05/2019, 4:30 PM

## 2019-03-06 LAB — GLUCOSE, CAPILLARY
Glucose-Capillary: 110 mg/dL — ABNORMAL HIGH (ref 70–99)
Glucose-Capillary: 170 mg/dL — ABNORMAL HIGH (ref 70–99)
Glucose-Capillary: 218 mg/dL — ABNORMAL HIGH (ref 70–99)
Glucose-Capillary: 140 mg/dL — ABNORMAL HIGH (ref 70–99)

## 2019-03-06 LAB — CBC WITH DIFFERENTIAL/PLATELET
Abs Immature Granulocytes: 0.16 10*3/uL — ABNORMAL HIGH (ref 0.00–0.07)
Basophils Absolute: 0 10*3/uL (ref 0.0–0.1)
Basophils Relative: 0 %
Eosinophils Absolute: 0 10*3/uL (ref 0.0–0.5)
Eosinophils Relative: 0 %
HCT: 45.6 % (ref 36.0–46.0)
Hemoglobin: 14.8 g/dL (ref 12.0–15.0)
Immature Granulocytes: 2 %
Lymphocytes Relative: 14 %
Lymphs Abs: 1.3 10*3/uL (ref 0.7–4.0)
MCH: 29.7 pg (ref 26.0–34.0)
MCHC: 32.5 g/dL (ref 30.0–36.0)
MCV: 91.6 fL (ref 80.0–100.0)
Monocytes Absolute: 0.6 10*3/uL (ref 0.1–1.0)
Monocytes Relative: 6 %
Neutro Abs: 7.4 10*3/uL (ref 1.7–7.7)
Neutrophils Relative %: 78 %
Platelets: 222 10*3/uL (ref 150–400)
RBC: 4.98 MIL/uL (ref 3.87–5.11)
RDW: 13.4 % (ref 11.5–15.5)
WBC: 9.5 10*3/uL (ref 4.0–10.5)
nRBC: 0 % (ref 0.0–0.2)

## 2019-03-06 LAB — COMPREHENSIVE METABOLIC PANEL
ALT: 43 U/L (ref 0–44)
AST: 23 U/L (ref 15–41)
Albumin: 3.2 g/dL — ABNORMAL LOW (ref 3.5–5.0)
Alkaline Phosphatase: 38 U/L (ref 38–126)
Anion gap: 11 (ref 5–15)
BUN: 43 mg/dL — ABNORMAL HIGH (ref 8–23)
CO2: 22 mmol/L (ref 22–32)
Calcium: 8.5 mg/dL — ABNORMAL LOW (ref 8.9–10.3)
Chloride: 101 mmol/L (ref 98–111)
Creatinine, Ser: 0.83 mg/dL (ref 0.44–1.00)
GFR calc Af Amer: >60 mL/min (ref >60)
GFR calc non Af Amer: >60 mL/min (ref >60)
Glucose, Bld: 123 mg/dL — ABNORMAL HIGH (ref 70–99)
Potassium: 4.5 mmol/L (ref 3.5–5.1)
Sodium: 134 mmol/L — ABNORMAL LOW (ref 135–145)
Total Bilirubin: 0.6 mg/dL (ref 0.3–1.2)
Total Protein: 6.1 g/dL — ABNORMAL LOW (ref 6.5–8.1)

## 2019-03-06 LAB — D-DIMER, QUANTITATIVE: D-Dimer, Quant: 0.7 ug/mL-FEU — ABNORMAL HIGH (ref 0.00–0.50)

## 2019-03-06 NOTE — Progress Notes (Signed)
PROGRESS NOTE                                                                                                                                                                                                             Patient Demographics:    [name], is a 73 y.o. female, DOB - November 06, 1945, XTK:[id number]  Outpatient Primary MD for the patient is Crist Infante, MD   Admit date - 02/24/2019   LOS - 10  Chief Complaint  Patient presents with  . Shortness of Breath       Brief Narrative: Patient is a 73 y.o. female with PMHx of chronic hypoxemic respiratory failure on 2 L of oxygen at home nocturnally, HTN, DM, RA, OSA, history of breast cancer in remission-presented to the hospital with acute hypoxemic respiratory failure due to COVID-19 pneumonia.  Hospital course complicated by worsening hypoxemia requiring transfer to the ICU for close monitoring.  Managed with supportive care-upon further stability transferred back to PCU on 8/9.  See below for further details   Subjective:    [name] continues to slowly improve-she is down to 2 L of oxygen.   Assessment  & Plan :   Acute on chronic hypoxemic (on 2 L of oxygen at nocturnally) resp Failure due to Covid 19 Viral pneumonia: Improving-now on 2 L of oxygen.  Continue steroids-continue attempts at mobilization.  Home in the next few days if clinical improvement continues.  Probably will require home O2.  Fever: Afebrile  Oxygen requirement: Down to 2 L  (on 4 L yesterday)  COVID-19 Labs: Recent Labs    03/04/19 0350 03/05/19 0500 03/06/19 0353  DDIMER 0.74* 1.00* 0.70*    COVID-19 Medications: Steroids: Decadron 8/2>> 8/5, Solu-Medrol 8/5>> 8/8, Decadron 8/8>> Remdesivir: 8/2>> 8/6 Actemra: 8/2 x 1 Convalescent Plasma: N/A Research Studies: N/A  Other medications: Diuretics: Furosemide Antibiotics: None  DM-2 (A1c 8.1 on 02/26/2019): CBG stable-continue  Lantus 16 units twice daily, 6 units of NovoLog with meals and SSI.    HTN: Controlled-continue amlodipine, Avapro and Lasix.    Bronchial asthma: Stable-no wheezing-no signs of active exacerbation-continue bronchodilators  Rheumatoid arthritis: Stable-no signs of flare-continue Plaquenil-follow QTC periodically  GERD: Continue PPI  History of breast cancer-currently in remission  Depression/anxiety: Currently stable-continue Cymbalta, Wellbutrin, BuSpar  Diabetic peripheral neuropathy: Stable-continue with Neurontin  Chronic hypoxemic  respiratory failure on nocturnal O2  OSA: Unable to use CPAP in this facility-use high flow oxygen at night  Deconditioning/debility: Secondary to acute illness-await PT/OT evaluation to determine appropriate resources at discharge  Obesity: Estimated body mass index is 38.94 kg/m as calculated from the following:   Height as of this encounter: 5\' 4"  (1.626 m).   Weight as of this encounter: 102.9 kg.   ABG: No results found for: PHART, PCO2ART, PO2ART, HCO3, TCO2, ACIDBASEDEF, O2SAT  Vent Settings: N/A  Condition - Stable  Family Communication  : Spoke with patient's daughter-Tammy over the phone  Code Status :  Full Code  Diet :  Diet Order            Diet heart healthy/carb modified Room service appropriate? Yes; Fluid consistency: Thin  Diet effective now               Disposition Plan: Remain hospitalized-home on 8/13-8/14  Consults  :  PCCM  Procedures  :  None  GI prophylaxis: PPI  DVT Prophylaxis  :  Lovenox   Lab Results  Component Value Date   PLT 222 03/06/2019    Antibiotics  :    Anti-infectives (From admission, onward)   Start     Dose/Rate Route Frequency Ordered Stop   02/25/19 2200  remdesivir 100 mg in sodium chloride 0.9 % 250 mL IVPB     100 mg 500 mL/hr over 30 Minutes Intravenous Every 24 hours 02/24/19 2336 02/28/19 2349   02/25/19 1000  doxycycline (VIBRA-TABS) tablet 100 mg  Status:   Discontinued     100 mg Oral 2 times daily 02/24/19 2344 02/25/19 0728   02/25/19 0100  remdesivir 200 mg in sodium chloride 0.9 % 250 mL IVPB     200 mg 500 mL/hr over 30 Minutes Intravenous Once 02/24/19 2336 02/25/19 0149   02/24/19 2330  hydroxychloroquine (PLAQUENIL) tablet 200 mg     200 mg Oral 2 times daily 02/24/19 2040     02/24/19 2200  doxycycline (VIBRA-TABS) tablet 100 mg  Status:  Discontinued     100 mg Oral 2 times daily 02/24/19 2040 02/24/19 2344      Inpatient Medications  Scheduled Meds: . albuterol  2 puff Inhalation BID  . amLODipine  5 mg Oral Daily  . aspirin  81 mg Oral QHS  . baclofen  10 mg Oral BID  . budesonide  1 puff Inhalation BID  . buPROPion  150 mg Oral Daily  . busPIRone  7.5 mg Oral BID  . Chlorhexidine Gluconate Cloth  6 each Topical Q0600  . dexamethasone  6 mg Oral Q breakfast  . DULoxetine  60 mg Oral Daily  . enoxaparin (LOVENOX) injection  40 mg Subcutaneous Q24H  . ezetimibe  10 mg Oral Daily  . fluticasone  2 spray Each Nare BID  . folic acid  2 mg Oral Daily  . furosemide  40 mg Oral Daily  . gabapentin  600 mg Oral TID  . galantamine  16 mg Oral Q breakfast  . hydroxychloroquine  200 mg Oral BID  . insulin aspart  0-20 Units Subcutaneous TID WC  . insulin aspart  0-5 Units Subcutaneous QHS  . insulin aspart  6 Units Subcutaneous TID WC  . insulin detemir  16 Units Subcutaneous BID  . irbesartan  300 mg Oral Daily  . nebivolol  10 mg Oral Daily  . nystatin  5 mL Oral QID  . pantoprazole  40 mg Oral  Daily  . potassium chloride  20 mEq Oral Daily  . sodium chloride flush  10-40 mL Intracatheter Q12H  . sodium chloride flush  3 mL Intravenous Q12H  . triamcinolone cream   Topical BID  . vitamin C  500 mg Oral Daily  . zinc sulfate  220 mg Oral Daily   Continuous Infusions: PRN Meds:.acetaminophen, labetalol, loperamide, LORazepam, prochlorperazine, sodium chloride flush, traMADol, zolpidem   Time Spent in minutes  25   See all Orders from today for further details   Oren Binet M.D on 03/06/2019 at 1:51 PM  To page go to [email or web address] - use universal password  [facility] -  Office  [phone]    Objective:   Vitals:   03/06/19 0004 03/06/19 0403 03/06/19 0810 03/06/19 1123  BP: (!) 110/55 (!) 120/46 127/68   Pulse: (!) 57 63    Resp: 17 19    Temp: (!) 97.5 F (36.4 C) 97.7 F (36.5 C)  (!) 97.5 F (36.4 C)  TempSrc: Oral Oral  Oral  SpO2: 96% 92%    Weight:      Height:        Wt Readings from Last 3 Encounters:  02/27/19 102.9 kg  01/09/19 111.2 kg  08/07/18 111.6 kg     Intake/Output Summary (Last 24 hours) at 03/06/2019 1351 Last data filed at 03/06/2019 0600 Gross per 24 hour  Intake 1080 ml  Output 0 ml  Net 1080 ml    Physical Exam Gen Exam:Alert awake-not in any distress HEENT:atraumatic, normocephalic Chest: B/L clear to auscultation anteriorly CVS:S1S2 regular Abdomen:soft non tender, non distended Extremities:no edema Neurology: Non focal Skin: no rash   Data Review:    CBC Recent Labs  Lab 03/01/19 0047 03/03/19 0555 03/04/19 0350 03/05/19 0815 03/06/19 0353  WBC 14.7* 15.4* 13.0* 10.3 9.5  HGB 14.4 15.2* 15.2* 15.5* 14.8  HCT 43.7 46.8* 46.2* 47.9* 45.6  PLT 227 317 308 269 222  MCV 90.3 90.5 91.3 91.9 91.6  MCH 29.8 29.4 30.0 29.8 29.7  MCHC 33.0 32.5 32.9 32.4 32.5  RDW 12.6 12.9 13.2 13.4 13.4  LYMPHSABS 1.0 1.5 1.8 2.3 1.3  MONOABS 0.6 0.8 0.8 0.8 0.6  EOSABS 0.0 0.0 0.0 0.1 0.0  BASOSABS 0.1 0.1 0.0 0.0 0.0    Chemistries  Recent Labs  Lab 02/28/19 0700 03/01/19 0047 03/02/19 0100 03/03/19 0555 03/04/19 0350 03/05/19 0500 03/06/19 0353  NA 132* 133*  --  134* 135 134* 134*  K 4.3 4.3  --  4.1 3.7 4.6 4.5  CL 100 101  --  99 100 99 101  CO2 22 22  --  25 23 19* 22  GLUCOSE 283* 232*  --  183* 128* 114* 123*  BUN 21 22  --  39* 46* 45* 43*  CREATININE 0.57 0.60  --  0.70 0.93 0.82 0.83  CALCIUM 8.5* 8.6*  --   8.7* 8.7* 8.8* 8.5*  MG 2.1 2.0 2.1 2.3  --   --   --   AST 30 25  --   --  24 25 23   ALT 41 36  --   --  45* 41 43  ALKPHOS 50 46  --   --  41 39 38  BILITOT 0.6 0.7  --   --  0.6 0.6 0.6   ------------------------------------------------------------------------------------------------------------------ No results for input(s): CHOL, HDL, LDLCALC, TRIG, CHOLHDL, LDLDIRECT in the last 72 hours.  Lab Results  Component Value Date   HGBA1C 8.1 (H)  02/26/2019   ------------------------------------------------------------------------------------------------------------------ No results for input(s): TSH, T4TOTAL, T3FREE, THYROIDAB in the last 72 hours.  Invalid input(s): FREET3 ------------------------------------------------------------------------------------------------------------------ No results for input(s): VITAMINB12, FOLATE, FERRITIN, TIBC, IRON, RETICCTPCT in the last 72 hours.  Coagulation profile No results for input(s): INR, PROTIME in the last 168 hours.  Recent Labs    03/05/19 0500 03/06/19 0353  DDIMER 1.00* 0.70*    Cardiac Enzymes No results for input(s): CKMB, TROPONINI, MYOGLOBIN in the last 168 hours.  Invalid input(s): CK ------------------------------------------------------------------------------------------------------------------    Component Value Date/Time   BNP 11.6 09/15/2017 1041    Micro Results Recent Results (from the past 240 hour(s))  SARS Coronavirus 2 [facility] order, Performed in [facility] hospital lab) Nasopharyngeal Nasopharyngeal Swab     Status: Abnormal   Collection Time: 02/24/19  4:30 PM   Specimen: Nasopharyngeal Swab  Result Value Ref Range Status   SARS Coronavirus 2 POSITIVE (A) NEGATIVE Final    Comment: RESULT CALLED TO, READ BACK BY AND VERIFIED WITH: HODGES,C RN @2003  ON 02/24/2019 JACKSON,K (NOTE) If result is NEGATIVE SARS-CoV-2 target nucleic acids are NOT DETECTED. The SARS-CoV-2 RNA is generally detectable  in upper and lower  respiratory specimens during the acute phase of infection. The lowest  concentration of SARS-CoV-2 viral copies this assay can detect is 250  copies / mL. A negative result does not preclude SARS-CoV-2 infection  and should not be used as the sole basis for treatment or other  patient management decisions.  A negative result may occur with  improper specimen collection / handling, submission of specimen other  than nasopharyngeal swab, presence of viral mutation(s) within the  areas targeted by this assay, and inadequate number of viral copies  (<250 copies / mL). A negative result must be combined with clinical  observations, patient history, and epidemiological information. If result is POSITIVE SARS-CoV-2 target nucleic acids are DETECTED . The SARS-CoV-2 RNA is generally detectable in upper and lower  respiratory specimens during the acute phase of infection.  Positive  results are indicative of active infection with SARS-CoV-2.  Clinical  correlation with patient history and other diagnostic information is  necessary to determine patient infection status.  Positive results do  not rule out bacterial infection or co-infection with other viruses. If result is PRESUMPTIVE POSTIVE SARS-CoV-2 nucleic acids MAY BE PRESENT.   A presumptive positive result was obtained on the submitted specimen  and confirmed on repeat testing.  While 2019 novel coronavirus  (SARS-CoV-2) nucleic acids may be present in the submitted sample  additional confirmatory testing may be necessary for epidemiological  and / or clinical management purposes  to differentiate between  SARS-CoV-2 and other Sarbecovirus currently known to infect humans.  If clinically indicated additional testing with an alternate test  methodology [phone] ) is advised. The SARS-CoV-2 RNA is generally  detectable in upper and lower respiratory specimens during the acute  phase of infection. The expected result is  Negative. Fact Sheet for Patients:  StrictlyIdeas.no Fact Sheet for Healthcare Providers: BankingDealers.co.za This test is not yet approved or cleared by the [place] FDA and has been authorized for detection and/or diagnosis of SARS-CoV-2 by FDA under an Emergency Use Authorization (EUA).  This EUA will remain in effect (meaning this test can be used) for the duration of the COVID-19 declaration under Section 564(b)(1) of the Act, 21 U.S.C. section 360bbb-3(b)(1), unless the authorization is terminated or revoked sooner. Performed at [facility] [address], [place], [place] [address]  MRSA PCR Screening     Status: None   Collection Time: 02/27/19  8:17 AM   Specimen: Nasal Mucosa; Nasopharyngeal  Result Value Ref Range Status   MRSA by PCR NEGATIVE NEGATIVE Final    Comment:        The GeneXpert MRSA Assay (FDA approved for NASAL specimens only), is one component of a comprehensive MRSA colonization surveillance program. It is not intended to diagnose MRSA infection nor to guide or monitor treatment for MRSA infections. Performed at [facility], [place] [address], [place], [place] [address]     Radiology Reports Portable Chest 1 View  Result Date: 02/25/2019 CLINICAL DATA:  Shortness of breath. EXAM: PORTABLE CHEST 1 VIEW COMPARISON:  Yesterday FINDINGS: Stable bilateral pulmonary infiltrate, greater on the left. Stable cardiomegaly. There has been CABG. No evident edema, effusion, or pneumothorax. IMPRESSION: Stable bilateral pneumonia. Electronically Signed   By: Monte Fantasia M.D.   On: 02/25/2019 04:40   Dg Chest Port 1 View  Result Date: 02/24/2019 CLINICAL DATA:  Shortness of breath.  Covid positive. EXAM: PORTABLE CHEST 1 VIEW COMPARISON:  11/27/2017 FINDINGS: Previous median sternotomy. Heart size is enlarged. Left mid lung and left base airspace opacities are new from the  previous exam compatible with multifocal pneumonia. The right lung appears clear. No edema or effusion. IMPRESSION: 1. Multifocal airspace densities within the left lung compatible with infection. Electronically Signed   By: Kerby Moors M.D.   On: 02/24/2019 17:26

## 2019-03-06 NOTE — Progress Notes (Signed)
SATURATION QUALIFICATIONS: (This note is used to comply with regulatory documentation for home oxygen)  Patient Saturations on Room Air at Rest = 87%  Patient Saturations on Room Air while Ambulating = 84%  Patient Saturations on 4 Liters of oxygen while Ambulating = 94%   Please briefly explain why patient needs home oxygen:to maintain Oxygen saturation >88%. [place]  Office [phone]

## 2019-03-06 NOTE — Care Management Important Message (Signed)
Important Message  Patient Details  Name: [name] MRN: [id number] Date of Birth: [birth year]   Medicare Important Message Given:  Yes - Important Message mailed due to current National Emergency   Verbal consent obtained due to current National Emergency  Relationship to patient: Child Contact Name: Laretta Bolster Call Date: 03/06/19  Time: 1524 Phone: [phone] Outcome: Spoke with contact Important Message mailed to: Patient address on file       Javionna Leder [address] 03/06/2019, 3:25 PM

## 2019-03-06 NOTE — TOC Initial Note (Signed)
Transition of Care [facility]) - Initial/Assessment Note    Patient Details  Name: [name] MRN: [id number] Date of Birth: [birth year]  [facility] of [facility] CM/SW Contact:    Weston Anna, LCSW Phone Number: 03/06/2019, 3:25 PM  Clinical Narrative:                  CSW spoke with patient via phone to discuss discharge plans. Patient is agreeable to [facility] needs and has previously worked with [facility] so would prefer to start services with them again. CSW reached out to Santiago Glad with [facility] to complete referral and is now waiting for call back.   Patient will be returning home with DME oxygen- referral completed with Magda Paganini at [place]. She is aware patient will be discharging tomorrow.   CSW will continue to follow up for Home Health needs and DME needs have been completed.    Expected Discharge Plan: [place] Barriers to Discharge: Continued Medical Work up   Patient Goals and CMS Choice   CMS Medicare.gov Compare Post Acute Care list provided to:: Patient Choice offered to / list presented to : Patient  Expected Discharge Plan and Services Expected Discharge Plan: [place] In-house Referral: Clinical Social Work   Post Acute Care Choice: Durable Medical Equipment, Home Health Living arrangements for the past 2 months: [place]                 DME Arranged: Oxygen DME Agency: [facility] Date DME Agency Contacted: 03/06/19 Time DME Agency Contacted: 8101 Representative spoke with at DME Agency: Magda Paganini HH Arranged: PT, OT, RN, Nurse's Aide [place] Agency: (waiting on follow up from [facility])        Prior Living Arrangements/Services Living arrangements for the past 2 months: [place] with:: Self Patient language and need for interpreter reviewed:: Yes        Need for Family Participation in Patient Care: Yes (Comment) Care giver support system in place?: Yes (comment)       Activities of Daily Living Home Assistive Devices/Equipment: Cane (specify quad or straight), Walker (specify type) ADL Screening (condition at time of admission) Patient's cognitive ability adequate to safely complete daily activities?: Yes Is the patient deaf or have difficulty hearing?: No Does the patient have difficulty seeing, even when wearing glasses/contacts?: No Does the patient have difficulty concentrating, remembering, or making decisions?: No Patient able to express need for assistance with ADLs?: Yes Does the patient have difficulty dressing or bathing?: No Independently performs ADLs?: Yes (appropriate for developmental age) Does the patient have difficulty walking or climbing stairs?: No Weakness of Legs: Both Weakness of Arms/Hands: None  Permission Sought/Granted                  Emotional Assessment   Attitude/Demeanor/Rapport: Engaged Affect (typically observed): Accepting, Calm, Appropriate, Happy Orientation: : Oriented to Self, Oriented to Place, Oriented to  Time, Oriented to Situation   Psych Involvement: No (comment)  Admission diagnosis:  Shortness of breath [R06.02] Acute respiratory failure with hypoxia ([employer]) [J96.01] Pneumonia due to 2019 novel coronavirus [U07.1, J12.89] COVID-19 virus detected [U07.1] Patient Active Problem List   Diagnosis Date Noted  . Acute on chronic respiratory failure with hypoxia ([place]) 02/28/2019  . OSA on CPAP 02/28/2019  . Diabetes mellitus type 2, uncontrolled, with complications ([place]) 75/04/2584  . Breast cancer ([place]) 02/27/2019  . History of cancer chemotherapy 02/27/2019  . History  of radiation therapy 02/27/2019  . Diabetic neuropathy ([place]) 02/27/2019  . HLD (hyperlipidemia) 02/27/2019  . Essential hypertension 02/27/2019  . QT prolongation 02/27/2019  . Pneumonia due to COVID-19 virus 02/24/2019  . Diarrhea   . Benign neoplasm of cecum   . Abdominal pain, epigastric   . Gastroesophageal reflux  disease   . Dependence on nocturnal oxygen therapy 07/10/2017  . Open wound of left lower leg 12/26/2016  . Vitamin D deficiency 09/14/2016  . History of total knee replacement, bilateral 09/14/2016  . DJD (degenerative joint disease), cervical 09/14/2016  . 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 High risk medication use 05/20/2016  . Spinal stenosis, lumbar region, with neurogenic claudication 09/08/2015    Class: Chronic  . OSA (obstructive sleep apnea) 10/17/2013  . Dyspnea 09/04/2013  . Atypical chest pain 09/04/2013  . Morbid obesity ([place]) 03/21/2012  . Cancer of lower-outer quadrant of female breast ([place]) 03/15/2012  . Diabetes mellitus, type II ([place]) 02/22/2011  . Coronary artery disease   . Hypertension   . Hyperlipidemia   . OA (osteoarthritis) of knee    PCP:  Crist Infante, MD Pharmacy:   [id number] [place], [place] [address] [place] [address] Phone: [phone] Fax: [facility] [place], [place] [address] Phone: [phone] Fax: [phone]     Social Determinants of Health (SDOH) Interventions    Readmission Risk Interventions No flowsheet data found.

## 2019-03-06 NOTE — Progress Notes (Signed)
Physical Therapy Treatment Patient Details Name: [name] MRN: [id number] DOB: 1946-05-02 Today's Date: 03/06/2019    History of Present Illness 73 y.o. female with PMHx of chronic hypoxemic respiratory failure on 2 L of oxygen at home nocturnally, HTN, DM, RA, OSA, history of breast cancer in remission-presented to the hospital with acute hypoxemic respiratory failure due to COVID-19 pneumonia.  Hospital course complicated by worsening hypoxemia requiring transfer to the ICU for close monitoring.  Managed with supportive care-upon further stability transferred back to PCU on 8/9.      PT Comments    The patient required 4 L O2 to maintain SaO2 .90% while ambulating. The patient is hopeful for Dc tomorrow. Recommend HHPT.   Follow Up Recommendations  Home health PT     Equipment Recommendations  None recommended by PT    Recommendations for Other Services       Precautions / Restrictions Precautions Precautions: Fall Precaution Comments: Monitor O2, and BP, orthostatic. pt states" I am easy to pass out"    Mobility  Bed Mobility               General bed mobility comments: In recliner upon arrival  Transfers   Equipment used: 4-wheeled walker Transfers: Sit to/from Stand Sit to Stand: Min guard         General transfer comment: Min Guard A for safety in standing. Requiring increased time and effort  Ambulation/Gait Ambulation/Gait assistance: Min assist Gait Distance (Feet): 45 Feet(x 2) Assistive device: 4-wheeled walker Gait Pattern/deviations: Step-to pattern;Decreased step length - left;Decreased step length - right;Shuffle Gait velocity: decr Gait velocity interpretation: <1.8 ft/sec, indicate of risk for recurrent falls General Gait Details: very slow shuffling steps, patient turned around to sit  on Rollator to rest after 45'. SaO2 on 2 L 87%. Increased to 4  to maintain >92% while ambualting.   Stairs             Wheelchair Mobility     Modified Rankin (Stroke Patients Only)       Balance Overall balance assessment: Needs assistance   Sitting balance-Leahy Scale: Good     Standing balance support: Bilateral upper extremity supported;During functional activity Standing balance-Leahy Scale: Fair                              Cognition Arousal/Alertness: Awake/alert Behavior During Therapy: WFL for tasks assessed/performed;Anxious                                   General Comments: pt. states that she does not feel like she is able to think as she did. Has some delauy in expressing self. Sloww to respond.      Exercises      General Comments        Pertinent Vitals/Pain Pain Assessment: No/denies pain    Home Living                      Prior Function            PT Goals (current goals can now be found in the care plan section) Progress towards PT goals: Progressing toward goals    Frequency    Min 3X/week      PT Plan Current plan remains appropriate    Co-evaluation  AM-PAC PT "6 Clicks" Mobility   Outcome Measure  Help needed turning from your back to your side while in a flat bed without using bedrails?: A Lot Help needed moving from lying on your back to sitting on the side of a flat bed without using bedrails?: A Lot Help needed moving to and from a bed to a chair (including a wheelchair)?: A Lot Help needed standing up from a chair using your arms (e.g., wheelchair or bedside chair)?: A Lot Help needed to walk in hospital room?: A Lot Help needed climbing 3-5 steps with a railing? : Total 6 Click Score: 11    End of Session Equipment Utilized During Treatment: Oxygen Activity Tolerance: Patient tolerated treatment well Patient left: in chair;with call bell/phone within reach Nurse Communication: Mobility status PT Visit Diagnosis: Difficulty in walking, not elsewhere classified (R26.2)     Time: 8343-7357 PT Time  Calculation (min) (ACUTE ONLY): 51 min  Charges:  $Gait Training: 38-52 mins                     [place]  Office [phone]    [name] 03/06/2019, 12:56 PM

## 2019-03-06 NOTE — Plan of Care (Signed)
Pt worked with therapy during shift. Pt anticipating d/c tomorrow with home O2. VSS during shift. Up to chair and ambulated halls. No pain. UOP adequate. Good appetite. No new skin issues. Call bell within reach. Will continue plan of care Problem: Education: Goal: Knowledge of risk factors and measures for prevention of condition will improve Outcome: Progressing   Problem: Respiratory: Goal: Will maintain a patent airway Outcome: Progressing Goal: Complications related to the disease process, condition or treatment will be avoided or minimized Outcome: Progressing   Problem: Education: Goal: Knowledge of General Education information will improve Description: Including pain rating scale, medication(s)/side effects and non-pharmacologic comfort measures Outcome: Progressing   Problem: Health Behavior/Discharge Planning: Goal: Ability to manage health-related needs will improve Outcome: Progressing   Problem: Clinical Measurements: Goal: Ability to maintain clinical measurements within normal limits will improve Outcome: Progressing Goal: Will remain free from infection Outcome: Progressing Goal: Diagnostic test results will improve Outcome: Progressing Goal: Respiratory complications will improve Outcome: Progressing Goal: Cardiovascular complication will be avoided Outcome: Progressing   Problem: Activity: Goal: Risk for activity intolerance will decrease Outcome: Progressing   Problem: Elimination: Goal: Will not experience complications related to bowel motility Outcome: Progressing Goal: Will not experience complications related to urinary retention Outcome: Progressing   Problem: Safety: Goal: Ability to remain free from injury will improve Outcome: Progressing

## 2019-03-07 LAB — CBC WITH DIFFERENTIAL/PLATELET
Abs Immature Granulocytes: 0.1 10*3/uL — ABNORMAL HIGH (ref 0.00–0.07)
Basophils Absolute: 0 10*3/uL (ref 0.0–0.1)
Basophils Relative: 0 %
Eosinophils Absolute: 0 10*3/uL (ref 0.0–0.5)
Eosinophils Relative: 0 %
HCT: 44.4 % (ref 36.0–46.0)
Hemoglobin: 14.4 g/dL (ref 12.0–15.0)
Immature Granulocytes: 1 %
Lymphocytes Relative: 19 %
Lymphs Abs: 1.8 10*3/uL (ref 0.7–4.0)
MCH: 30.3 pg (ref 26.0–34.0)
MCHC: 32.4 g/dL (ref 30.0–36.0)
MCV: 93.3 fL (ref 80.0–100.0)
Monocytes Absolute: 0.8 10*3/uL (ref 0.1–1.0)
Monocytes Relative: 8 %
Neutro Abs: 6.9 10*3/uL (ref 1.7–7.7)
Neutrophils Relative %: 72 %
Platelets: 221 10*3/uL (ref 150–400)
RBC: 4.76 MIL/uL (ref 3.87–5.11)
RDW: 13.5 % (ref 11.5–15.5)
WBC: 9.6 10*3/uL (ref 4.0–10.5)
nRBC: 0 % (ref 0.0–0.2)

## 2019-03-07 LAB — COMPREHENSIVE METABOLIC PANEL
ALT: 41 U/L (ref 0–44)
AST: 22 U/L (ref 15–41)
Albumin: 3.4 g/dL — ABNORMAL LOW (ref 3.5–5.0)
Alkaline Phosphatase: 36 U/L — ABNORMAL LOW (ref 38–126)
Anion gap: 12 (ref 5–15)
BUN: 39 mg/dL — ABNORMAL HIGH (ref 8–23)
CO2: 23 mmol/L (ref 22–32)
Calcium: 8.7 mg/dL — ABNORMAL LOW (ref 8.9–10.3)
Chloride: 100 mmol/L (ref 98–111)
Creatinine, Ser: 0.7 mg/dL (ref 0.44–1.00)
GFR calc Af Amer: >60 mL/min (ref >60)
GFR calc non Af Amer: >60 mL/min (ref >60)
Glucose, Bld: 109 mg/dL — ABNORMAL HIGH (ref 70–99)
Potassium: 4.5 mmol/L (ref 3.5–5.1)
Sodium: 135 mmol/L (ref 135–145)
Total Bilirubin: 0.6 mg/dL (ref 0.3–1.2)
Total Protein: 6.1 g/dL — ABNORMAL LOW (ref 6.5–8.1)

## 2019-03-07 LAB — D-DIMER, QUANTITATIVE: D-Dimer, Quant: 0.72 ug/mL-FEU — ABNORMAL HIGH (ref 0.00–0.50)

## 2019-03-07 LAB — GLUCOSE, CAPILLARY
Glucose-Capillary: 82 mg/dL (ref 70–99)
Glucose-Capillary: 185 mg/dL — ABNORMAL HIGH (ref 70–99)

## 2019-03-07 MED ORDER — BLOOD GLUCOSE METER KIT: PACK | 0 refills | Status: AC

## 2019-03-07 MED ORDER — ALBUTEROL SULFATE HFA 108 (90 BASE) MCG/ACT IN AERS: 2.0000 | INHALATION_SPRAY | RESPIRATORY_TRACT | 0 refills | Status: AC | PRN

## 2019-03-07 MED ORDER — BLOOD GLUCOSE METER KIT: PACK | 0 refills | Status: DC

## 2019-03-07 MED ORDER — METFORMIN HCL 500 MG PO TABS: 500.0000 mg | ORAL_TABLET | Freq: Two times a day (BID) | ORAL | 0 refills | Status: DC

## 2019-03-07 MED ORDER — INSULIN DETEMIR 100 UNIT/ML ~~LOC~~ SOLN
10.0000 [IU] | Freq: Two times a day (BID) | SUBCUTANEOUS | Status: DC
  Filled 2019-03-07: qty 0.1

## 2019-03-07 MED ORDER — EZETIMIBE 10 MG PO TABS: 10.0000 mg | ORAL_TABLET | Freq: Every day | ORAL | 0 refills | Status: DC

## 2019-03-07 NOTE — Discharge Summary (Signed)
PATIENT DETAILS Name: [name] Age: 73 y.o. Sex: female Date of Birth: 1946-04-05 MRN: [id number]. Admitting Physician: Neena Rhymes, MD [id number], Elta Guadeloupe, MD  Admit Date: 02/24/2019 Discharge date: 03/07/2019  Recommendations for Outpatient Follow-up:  1. Follow up with PCP in 1-2 weeks 2. Please obtain BMP/CBC in one week 3. Amlodipine on hold-resume when able  Admitted From:  Home  Disposition: Home with home health services   [place]: Yes  Equipment/Devices: Oxygen 2 L, foley  Discharge Condition: Stable  CODE STATUS: FULL CODE  Diet recommendation:  Diet Order            Diet - low sodium heart healthy        Diet Carb Modified        Diet heart healthy/carb modified Room service appropriate? Yes; Fluid consistency: Thin  Diet effective now               Brief Summary: See H&P, Labs, Consult and Test reports for all details in brief,Patient is a 72 y.o. female with PMHx of chronic hypoxemic respiratory failure on 2 L of oxygen at home nocturnally, HTN, DM, RA, OSA, history of breast cancer in remission-presented to the hospital with acute hypoxemic respiratory failure due to COVID-19 pneumonia.  Hospital course complicated by worsening hypoxemia requiring transfer to the ICU for close monitoring.  Managed with supportive care-upon further stability transferred back to PCU on 8/9.    Thankfully patient continued to improve-by day of discharge-was stable on 2 L of oxygen.  See below for further details   Brief Hospital Course:  Acute on chronic hypoxemic (on 2 L of oxygen at nocturnally) resp Failure due to Covid 19 Viral pneumonia:  Has markedly improved-initially monitored in the ICU as significant oxygen requirements.  Treated with steroids, Remdesivir and 1 dose of Actemra.  Has completed more than 2 weeks of steroids-suspect does not need any further steroids on discharge.  Has been stable on 2 L for the past 2 days.  On 2 L at home mostly at  night.  Since stable-and almost back to baseline-okay to discharge home with home health services and home O2.  PCP to reassess oxygen requirement at next visit.    Fever: Afebrile  Oxygen requirement:  Remains stable on 2 L COVID-19 Labs  Recent Labs    03/05/19 0500 03/06/19 0353 03/07/19 0300  DDIMER 1.00* 0.70* 0.72*    Lab Results  Component Value Date   SARSCOV2NAA POSITIVE (A) 02/24/2019    COVID-19 Medications: Steroids: Decadron 8/2>> 8/5, Solu-Medrol 8/5>> 8/8, Decadron 8/8>>8/13 (Note on decadron prior to admit) Remdesivir: 8/2>> 8/6 Actemra: 8/2 x 1 Convalescent Plasma: N/A Research Studies: N/A  Other medications: Diuretics: Furosemide-switch back to HCTZ on d/c (no volume excess on exam) Antibiotics: None  DM-2 (A1c 8.1 on 02/26/2019): CBG stable-managed with Lantus and NovoLog insulin-since no longer on steroids-we will switch her to metformin on discharge.  PCP to follow and optimize accordingly   HTN: Controlled-continue Avapro, HCTZ and Bystolic.  Resume amlodipine if blood pressure remains uncontrolled.  Bronchial asthma: Stable-no wheezing-no signs of active exacerbation-continue bronchodilators  Rheumatoid arthritis: Stable-no signs of flare-continue Plaquenil  GERD: Continue PPI  History of breast cancer-currently in remission  Depression/anxiety: Currently stable-continue Cymbalta, Wellbutrin, BuSpar  Diabetic peripheral neuropathy: Stable-continue with Neurontin  Chronic hypoxemic respiratory failure on nocturnal O2  OSA: Unable to use CPAP in this facility-used high flow oxygen at night-resume CPAP on discharge  Deconditioning/debility: Secondary to acute illness-PT/OT  evaluation complete-home health services has been ordered  Obesity: Estimated body mass index is 38.94 kg/m as calculated from the following:   Height as of this encounter: 5' 4"  (1.626 m).   Weight as of this encounter: 102.9 kg.   Note-spoke with patient's  daughter [name] on the day of discharge  Procedures/Studies: None  Discharge Diagnoses:  Active Problems:   Coronary artery disease   Hypertension   Diabetes mellitus, type II (HCC)   OSA (obstructive sleep apnea)   Pneumonia due to COVID-19 virus   Breast cancer (HCC)   History of cancer chemotherapy   History of radiation therapy   Diabetic neuropathy (HCC)   HLD (hyperlipidemia)   Essential hypertension   QT prolongation   Acute on chronic respiratory failure with hypoxia (HCC)   OSA on CPAP   Diabetes mellitus type 2, uncontrolled, with complications [facility])   Discharge Instructions:  Activity:  As tolerated   Discharge Instructions    Call MD for:  difficulty breathing, headache or visual disturbances   Complete by: As directed    Call MD for:  extreme fatigue   Complete by: As directed    Call MD for:  persistant dizziness or light-headedness   Complete by: As directed    Call MD for:  persistant nausea and vomiting   Complete by: As directed    Diet - low sodium heart healthy   Complete by: As directed    Diet Carb Modified   Complete by: As directed    Discharge instructions   Complete by: As directed    Follow with Primary MD  Crist Infante, MD in 1-2 weeks  Please get a complete blood count and chemistry panel checked by your Primary MD at your next visit, and again as instructed by your Primary MD.  Get Medicines reviewed and adjusted: Please take all your medications with you for your next visit with your Primary MD  Laboratory/radiological data: Please request your Primary MD to go over all hospital tests and procedure/radiological results at the follow up, please ask your Primary MD to get all Hospital records sent to his/her office.  In some cases, they will be blood work, cultures and biopsy results pending at the time of your discharge. Please request that your primary care M.D. follows up on these results.  Also Note the following: If you  experience worsening of your admission symptoms, develop shortness of breath, life threatening emergency, suicidal or homicidal thoughts you must seek medical attention immediately by calling 911 or calling your MD immediately  if symptoms less severe.  You must read complete instructions/literature along with all the possible adverse reactions/side effects for all the Medicines you take and that have been prescribed to you. Take any new Medicines after you have completely understood and accpet all the possible adverse reactions/side effects.   Do not drive when taking Pain medications or sleeping medications (Benzodaizepines)  Do not take more than prescribed Pain, Sleep and Anxiety Medications. It is not advisable to combine anxiety,sleep and pain medications without talking with your primary care practitioner  Special Instructions: If you have smoked or chewed Tobacco  in the last 2 yrs please stop smoking, stop any regular Alcohol  and or any Recreational drug use.  Wear Seat belts while driving.  Please note: You were cared for by a hospitalist during your hospital stay. Once you are discharged, your primary care physician will handle any further medical issues. Please note that NO REFILLS for any discharge  medications will be authorized once you are discharged, as it is imperative that you return to your primary care physician (or establish a relationship with a primary care physician if you do not have one) for your post hospital discharge needs so that they can reassess your need for medications and monitor your lab values.  ?   Person Under Monitoring Name: [name]  Location: [place] [address]   Infection Prevention Recommendations for Individuals Confirmed to have, or Being Evaluated for, 2019 Novel Coronavirus (COVID-19) Infection Who Receive Care at Home  Individuals who are confirmed to have, or are being evaluated for, COVID-19 should follow the  prevention steps below until a healthcare provider or local or state health department says they can return to normal activities.  Stay home except to get medical care You should restrict activities outside your home, except for getting medical care. Do not go to work, school, or public areas, and do not use public transportation or taxis.  Call ahead before visiting your doctor Before your medical appointment, call the healthcare provider and tell them that you have, or are being evaluated for, COVID-19 infection. This will help the healthcare providers office take steps to keep other people from getting infected. Ask your healthcare provider to call the local or state health department.  Monitor your symptoms Seek prompt medical attention if your illness is worsening (e.g., difficulty breathing). Before going to your medical appointment, call the healthcare provider and tell them that you have, or are being evaluated for, COVID-19 infection. Ask your healthcare provider to call the local or state health department.  Wear a facemask You should wear a facemask that covers your nose and mouth when you are in the same room with other people and when you visit a healthcare provider. People who live with or visit you should also wear a facemask while they are in the same room with you.  Separate yourself from other people in your home As much as possible, you should stay in a different room from other people in your home. Also, you should use a separate bathroom, if available.  Avoid sharing household items You should not share dishes, drinking glasses, cups, eating utensils, towels, bedding, or other items with other people in your home. After using these items, you should wash them thoroughly with soap and water.  Cover your coughs and sneezes Cover your mouth and nose with a tissue when you cough or sneeze, or you can cough or sneeze into your sleeve. Throw used tissues in a lined  trash can, and immediately wash your hands with soap and water for at least 20 seconds or use an alcohol-based hand rub.  Wash your [facility] your hands often and thoroughly with soap and water for at least 20 seconds. You can use an alcohol-based hand sanitizer if soap and water are not available and if your hands are not visibly dirty. Avoid touching your eyes, nose, and mouth with unwashed hands.   Prevention Steps for Caregivers and Household Members of Individuals Confirmed to have, or Being Evaluated for, COVID-19 Infection Being Cared for in the Home  If you live with, or provide care at home for, a person confirmed to have, or being evaluated for, COVID-19 infection please follow these guidelines to prevent infection:  Follow healthcare providers instructions Make sure that you understand and can help the patient follow any healthcare provider instructions for all care.  Provide for the patients basic needs  You should help the patient with basic needs in the home and provide support for getting groceries, prescriptions, and other personal needs.  Monitor the patients symptoms If they are getting sicker, call his or her medical provider and tell them that the patient has, or is being evaluated for, COVID-19 infection. This will help the healthcare providers office take steps to keep other people from getting infected. Ask the healthcare provider to call the local or state health department.  Limit the number of people who have contact with the patient If possible, have only one caregiver for the patient. Other household members should stay in another home or place of residence. If this is not possible, they should stay in another room, or be separated from the patient as much as possible. Use a separate bathroom, if available. Restrict visitors who do not have an essential need to be in the home.  Keep older adults, very young children, and other sick people away from the  patient Keep older adults, very young children, and those who have compromised immune systems or chronic health conditions away from the patient. This includes people with chronic heart, lung, or kidney conditions, diabetes, and cancer.  Ensure good ventilation Make sure that shared spaces in the home have good air flow, such as from an air conditioner or an opened window, weather permitting.  Wash your hands often Wash your hands often and thoroughly with soap and water for at least 20 seconds. You can use an alcohol based hand sanitizer if soap and water are not available and if your hands are not visibly dirty. Avoid touching your eyes, nose, and mouth with unwashed hands. Use disposable paper towels to dry your hands. If not available, use dedicated cloth towels and replace them when they become wet.  Wear a facemask and gloves Wear a disposable facemask at all times in the room and gloves when you touch or have contact with the patients blood, body fluids, and/or secretions or excretions, such as sweat, saliva, sputum, nasal mucus, vomit, urine, or feces.  Ensure the mask fits over your nose and mouth tightly, and do not touch it during use. Throw out disposable facemasks and gloves after using them. Do not reuse. Wash your hands immediately after removing your facemask and gloves. If your personal clothing becomes contaminated, carefully remove clothing and launder. Wash your hands after handling contaminated clothing. Place all used disposable facemasks, gloves, and other waste in a lined container before disposing them with other household waste. Remove gloves and wash your hands immediately after handling these items.  Do not share dishes, glasses, or other household items with the patient Avoid sharing household items. You should not share dishes, drinking glasses, cups, eating utensils, towels, bedding, or other items with a patient who is confirmed to have, or being evaluated for,  COVID-19 infection. After the person uses these items, you should wash them thoroughly with soap and water.  Wash laundry thoroughly Immediately remove and wash clothes or bedding that have blood, body fluids, and/or secretions or excretions, such as sweat, saliva, sputum, nasal mucus, vomit, urine, or feces, on them. Wear gloves when handling laundry from the patient. Read and follow directions on labels of laundry or clothing items and detergent. In general, wash and dry with the warmest temperatures recommended on the label.  Clean all areas the individual has used often Clean all touchable surfaces, such as counters, tabletops, doorknobs, bathroom fixtures, toilets, phones, keyboards, tablets, and bedside tables, every day. Also,  clean any surfaces that may have blood, body fluids, and/or secretions or excretions on them. Wear gloves when cleaning surfaces the patient has come in contact with. Use a diluted bleach solution (e.g., dilute bleach with 1 part bleach and 10 parts water) or a household disinfectant with a label that says EPA-registered for coronaviruses. To make a bleach solution at home, add 1 tablespoon of bleach to 1 quart (4 cups) of water. For a larger supply, add  cup of bleach to 1 gallon (16 cups) of water. Read labels of cleaning products and follow recommendations provided on product labels. Labels contain instructions for safe and effective use of the cleaning product including precautions you should take when applying the product, such as wearing gloves or eye protection and making sure you have good ventilation during use of the product. Remove gloves and wash hands immediately after cleaning.  Monitor yourself for signs and symptoms of illness Caregivers and household members are considered close contacts, should monitor their health, and will be asked to limit movement outside of the home to the extent possible. Follow the monitoring steps for close contacts listed on  the symptom monitoring form.   ? If you have additional questions, contact your local health department or call the epidemiologist on call at [phone] (available 24/7). ? This guidance is subject to change. For the most up-to-date guidance from [facility], please refer to their website: YouBlogs.pl   Increase activity slowly   Complete by: As directed      Allergies as of 03/07/2019      Reactions   Statins Other (See Comments)   Joint pain and swelling/burning   Morphine And Related Other (See Comments)   hallucinations      Medication List    STOP taking these medications   amLODipine 5 MG tablet Commonly known as: NORVASC   dexamethasone 6 MG tablet Commonly known as: DECADRON   doxycycline 100 MG capsule Commonly known as: MONODOX   nystatin 100000 UNIT/ML suspension Commonly known as: MYCOSTATIN     TAKE these medications   acetaminophen 500 MG tablet Commonly known as: TYLENOL Take 1,000 mg by mouth 2 (two) times daily as needed for moderate pain.   albuterol 108 (90 Base) MCG/ACT inhaler Commonly known as: VENTOLIN HFA Inhale 2 puffs into the lungs every 4 (four) hours as needed for wheezing or shortness of breath (cough, shortness of breath or wheezing.).   aspirin 81 MG tablet Take 81 mg by mouth at bedtime.   baclofen 10 MG tablet Commonly known as: LIORESAL TAKE 1 TABLET BY MOUTH TWICE A DAY AS NEEDED FOR SPASMS What changed: See the new instructions.   betamethasone dipropionate 0.05 % ointment Commonly known as: DIPROLENE Apply 1 application topically daily.   blood glucose meter kit and supplies Dispense based on patient and insurance preference. Use up to four times daily as directed. (FOR ICD-10 E10.9, E11.9).   budesonide 0.5 MG/2ML nebulizer solution Commonly known as: PULMICORT Take 0.5 mg by nebulization 2 (two) times a day.   buPROPion 150 MG 24 hr tablet Commonly known as:  WELLBUTRIN XL Take 150 mg by mouth daily.   busPIRone 7.5 MG tablet Commonly known as: BUSPAR Take 7.5 mg by mouth 2 (two) times daily.   DULoxetine 60 MG capsule Commonly known as: CYMBALTA Take 60 mg by mouth daily.   ezetimibe 10 MG tablet Commonly known as: ZETIA Take 1 tablet (10 mg total) by mouth daily. Start taking on: March 08, 2019  fluticasone 50 MCG/ACT nasal spray Commonly known as: FLONASE Place 2 sprays into both nostrils 2 (two) times a day.   folic acid 1 MG tablet Commonly known as: FOLVITE Take 2 tablets (2 mg total) by mouth daily.   gabapentin 300 MG capsule Commonly known as: NEURONTIN Take 600 mg by mouth 3 (three) times daily.   galantamine 16 MG 24 hr capsule Commonly known as: RAZADYNE ER Take 16 mg by mouth daily with breakfast.   hydroxychloroquine 200 MG tablet Commonly known as: PLAQUENIL TAKE 1 TABLET BY MOUTH TWICE A DAY   meloxicam 15 MG tablet Commonly known as: MOBIC Take 15 mg by mouth daily.   metFORMIN 500 MG tablet Commonly known as: Glucophage Take 1 tablet (500 mg total) by mouth 2 (two) times daily with a meal.   nebivolol 10 MG tablet Commonly known as: BYSTOLIC Take 10 mg by mouth daily.   olmesartan-hydrochlorothiazide 40-25 MG tablet Commonly known as: BENICAR HCT Take 1 tablet by mouth daily.   omeprazole 20 MG capsule Commonly known as: PRILOSEC Take 20 mg by mouth daily.   ZINC PO Take 1 tablet by mouth daily.            Durable Medical Equipment  (From admission, onward)         Start     Ordered   03/06/19 1413  For home use only DME oxygen  Once    Question Answer Comment  Length of Need 6 Months   Mode or (Route) Mask   Liters per Minute 2   Frequency Continuous (stationary and portable oxygen unit needed)   Oxygen conserving device Yes   Oxygen delivery system Gas      03/06/19 1412         Follow-up Information    Crist Infante, MD. Schedule an appointment as soon as possible  for a visit in 1 week(s).   Specialty: Internal Medicine Contact information: [place] [address] [phone]          Allergies  Allergen Reactions   Statins Other (See Comments)    Joint pain and swelling/burning   Morphine And Related Other (See Comments)    hallucinations    Consultations:   pulmonary/intensive care   Other Procedures/Studies: Portable Chest 1 View  Result Date: 02/25/2019 CLINICAL DATA:  Shortness of breath. EXAM: PORTABLE CHEST 1 VIEW COMPARISON:  Yesterday FINDINGS: Stable bilateral pulmonary infiltrate, greater on the left. Stable cardiomegaly. There has been CABG. No evident edema, effusion, or pneumothorax. IMPRESSION: Stable bilateral pneumonia. Electronically Signed   By: Monte Fantasia M.D.   On: 02/25/2019 04:40   Dg Chest Port 1 View  Result Date: 02/24/2019 CLINICAL DATA:  Shortness of breath.  Covid positive. EXAM: PORTABLE CHEST 1 VIEW COMPARISON:  11/27/2017 FINDINGS: Previous median sternotomy. Heart size is enlarged. Left mid lung and left base airspace opacities are new from the previous exam compatible with multifocal pneumonia. The right lung appears clear. No edema or effusion. IMPRESSION: 1. Multifocal airspace densities within the left lung compatible with infection. Electronically Signed   By: Kerby Moors M.D.   On: 02/24/2019 17:26      TODAY-DAY OF DISCHARGE:  Subjective:   [name] today has no headache,no chest abdominal pain,no new weakness tingling or numbness, feels much better wants to go home today.   Objective:   Blood pressure (!) 146/85, pulse (!) 57, temperature 97.8 F (36.6 C), temperature source Oral, resp. rate 17, height 5' 4"  (  1.626 m), weight 102.9 kg, SpO2 98 %.  Intake/Output Summary (Last 24 hours) at 03/07/2019 1332 Last data filed at 03/07/2019 1100 Gross per 24 hour  Intake 800 ml  Output 101 ml  Net 699 ml   Filed Weights   02/24/19 2322 02/24/19 2335 02/27/19 0400    Weight: 110 kg 102.8 kg 102.9 kg    Exam: Awake Alert, Oriented *3, No new F.N deficits, Normal affect [place].AT,PERRAL Supple Neck,No JVD, No cervical lymphadenopathy appriciated.  Symmetrical Chest wall movement, Good air movement bilaterally, CTAB RRR,No Gallops,Rubs or new Murmurs, No Parasternal Heave +ve B.Sounds, Abd Soft, Non tender, No organomegaly appriciated, No rebound -guarding or rigidity. No Cyanosis, Clubbing or edema, No new Rash or bruise   PERTINENT RADIOLOGIC STUDIES: Portable Chest 1 View  Result Date: 02/25/2019 CLINICAL DATA:  Shortness of breath. EXAM: PORTABLE CHEST 1 VIEW COMPARISON:  Yesterday FINDINGS: Stable bilateral pulmonary infiltrate, greater on the left. Stable cardiomegaly. There has been CABG. No evident edema, effusion, or pneumothorax. IMPRESSION: Stable bilateral pneumonia. Electronically Signed   By: Monte Fantasia M.D.   On: 02/25/2019 04:40   Dg Chest Port 1 View  Result Date: 02/24/2019 CLINICAL DATA:  Shortness of breath.  Covid positive. EXAM: PORTABLE CHEST 1 VIEW COMPARISON:  11/27/2017 FINDINGS: Previous median sternotomy. Heart size is enlarged. Left mid lung and left base airspace opacities are new from the previous exam compatible with multifocal pneumonia. The right lung appears clear. No edema or effusion. IMPRESSION: 1. Multifocal airspace densities within the left lung compatible with infection. Electronically Signed   By: Kerby Moors M.D.   On: 02/24/2019 17:26     PERTINENT LAB RESULTS: CBC: Recent Labs    03/06/19 0353 03/07/19 0300  WBC 9.5 9.6  HGB 14.8 14.4  HCT 45.6 44.4  PLT 222 221   CMET CMP     Component Value Date/Time   NA 135 03/07/2019 0300   NA 142 12/29/2016 1140   K 4.5 03/07/2019 0300   K 3.7 12/29/2016 1140   CL 100 03/07/2019 0300   CO2 23 03/07/2019 0300   CO2 29 12/29/2016 1140   GLUCOSE 109 (H) 03/07/2019 0300   GLUCOSE 146 (H) 12/29/2016 1140   BUN 39 (H) 03/07/2019 0300   BUN 16.3  12/29/2016 1140   CREATININE 0.70 03/07/2019 0300   CREATININE 0.55 (L) 01/09/2019 1229   CREATININE 0.7 12/29/2016 1140   CALCIUM 8.7 (L) 03/07/2019 0300   CALCIUM 9.9 12/29/2016 1140   PROT 6.1 (L) 03/07/2019 0300   PROT 7.2 12/29/2016 1140   ALBUMIN 3.4 (L) 03/07/2019 0300   ALBUMIN 4.1 12/29/2016 1140   AST 22 03/07/2019 0300   AST 27 01/04/2018 1032   AST 23 12/29/2016 1140   ALT 41 03/07/2019 0300   ALT 31 01/04/2018 1032   ALT 23 12/29/2016 1140   ALKPHOS 36 (L) 03/07/2019 0300   ALKPHOS 61 12/29/2016 1140   BILITOT 0.6 03/07/2019 0300   BILITOT 0.4 01/04/2018 1032   BILITOT 0.52 12/29/2016 1140   GFRNONAA >60 03/07/2019 0300   GFRNONAA 93 01/09/2019 1229   GFRAA >60 03/07/2019 0300   GFRAA 108 01/09/2019 1229    GFR Estimated Creatinine Clearance: 72.1 mL/min (by C-G formula based on SCr of 0.7 mg/dL). No results for input(s): LIPASE, AMYLASE in the last 72 hours. No results for input(s): CKTOTAL, CKMB, CKMBINDEX, TROPONINI in the last 72 hours. Invalid input(s): POCBNP Recent Labs    03/06/19 0353 03/07/19 0300  DDIMER 0.70*  0.72*   No results for input(s): HGBA1C in the last 72 hours. No results for input(s): CHOL, HDL, LDLCALC, TRIG, CHOLHDL, LDLDIRECT in the last 72 hours. No results for input(s): TSH, T4TOTAL, T3FREE, THYROIDAB in the last 72 hours.  Invalid input(s): FREET3 No results for input(s): VITAMINB12, FOLATE, FERRITIN, TIBC, IRON, RETICCTPCT in the last 72 hours. Coags: No results for input(s): INR in the last 72 hours.  Invalid input(s): PT Microbiology: Recent Results (from the past 240 hour(s))  MRSA PCR Screening     Status: None   Collection Time: 02/27/19  8:17 AM   Specimen: Nasal Mucosa; Nasopharyngeal  Result Value Ref Range Status   MRSA by PCR NEGATIVE NEGATIVE Final    Comment:        The GeneXpert MRSA Assay (FDA approved for NASAL specimens only), is one component of a comprehensive MRSA colonization surveillance  program. It is not intended to diagnose MRSA infection nor to guide or monitor treatment for MRSA infections. Performed at [facility], [place] [address] [place], [place] [address]     FURTHER DISCHARGE INSTRUCTIONS:  Get Medicines reviewed and adjusted: Please take all your medications with you for your next visit with your Primary MD  Laboratory/radiological data: Please request your Primary MD to go over all hospital tests and procedure/radiological results at the follow up, please ask your Primary MD to get all Hospital records sent to his/her office.  In some cases, they will be blood work, cultures and biopsy results pending at the time of your discharge. Please request that your primary care M.D. goes through all the records of your hospital data and follows up on these results.  Also Note the following: If you experience worsening of your admission symptoms, develop shortness of breath, life threatening emergency, suicidal or homicidal thoughts you must seek medical attention immediately by calling 911 or calling your MD immediately  if symptoms less severe.  You must read complete instructions/literature along with all the possible adverse reactions/side effects for all the Medicines you take and that have been prescribed to you. Take any new Medicines after you have completely understood and accpet all the possible adverse reactions/side effects.   Do not drive when taking Pain medications or sleeping medications (Benzodaizepines)  Do not take more than prescribed Pain, Sleep and Anxiety Medications. It is not advisable to combine anxiety,sleep and pain medications without talking with your primary care practitioner  Special Instructions: If you have smoked or chewed Tobacco  in the last 2 yrs please stop smoking, stop any regular Alcohol  and or any Recreational drug use.  Wear Seat belts while driving.  Please note: You were cared for by a hospitalist  during your hospital stay. Once you are discharged, your primary care physician will handle any further medical issues. Please note that NO REFILLS for any discharge medications will be authorized once you are discharged, as it is imperative that you return to your primary care physician (or establish a relationship with a primary care physician if you do not have one) for your post hospital discharge needs so that they can reassess your need for medications and monitor your lab values.  Total Time spent coordinating discharge including counseling, education and face to face time equals 35 minutes.  Signed: Vuk Skillern 03/07/2019 1:32 PM

## 2019-03-07 NOTE — Progress Notes (Signed)
Occupational Therapy Treatment Patient Details Name: [name] MRN: [id number] DOB: [birth year] Today's Date: 03/07/2019    History of present illness 73 y.o. female with PMHx of chronic hypoxemic respiratory failure on 2 L of oxygen at home nocturnally, HTN, DM, RA, OSA, history of breast cancer in remission-presented to the hospital with acute hypoxemic respiratory failure due to COVID-19 pneumonia.  Hospital course complicated by worsening hypoxemia requiring transfer to the ICU for close monitoring.  Managed with supportive care-upon further stability transferred back to PCU on 8/9.     OT comments  Pt progressing towards OT goals and is motivated to work with therapies. Pt initially limited due to dizziness but with improvements as session progressed. Pt tolerated standing at sink approx 9-10 min during completion of grooming ADL; requiring close minguard assist for standing balance and cues to initiate seated rest break. Pt able to perform additional room level mobility using RW with minguard assist and increased time. Session completed on 3L O2 with O2 sats briefly down to high 80s, overall maintaining >90%; returned to 2L end of session while resting in recliner. Feel POC remains appropriate at this time. Will continue to follow acutely.  BP monitored during session:  108/64 start of session Increased dizziness with initial sit<>stand, returned to sitting, BP 103/45 Completed additional sit<>stand, BP in standing 116/88 and pt reports symptoms improving   Follow Up Recommendations  Home health OT;Supervision - Intermittent    Equipment Recommendations  None recommended by OT          Precautions / Restrictions Precautions Precautions: Fall Precaution Comments: Monitor O2, and BP, orthostatic. pt states" I am easy to pass out" Restrictions Weight Bearing Restrictions: No       Mobility Bed Mobility               General bed mobility comments: received OOB on BSC  upon arrival  Transfers Overall transfer level: Needs assistance Equipment used: Rolling walker (2 wheeled) Transfers: Sit to/from Stand Sit to Stand: Min guard         General transfer comment: Min Guard A for safety in standing. Requiring increased time and effort; maintained static standing prior to any further mobility to ensure dizziness was controlled    Balance Overall balance assessment: Needs assistance   Sitting balance-Leahy Scale: Good     Standing balance support: Bilateral upper extremity supported;During functional activity Standing balance-Leahy Scale: Fair                             ADL either performed or assessed with clinical judgement   ADL Overall ADL's : Needs assistance/impaired     Grooming: Min guard;Standing;Wash/dry face;Oral care Grooming Details (indicate cue type and reason): stood approx 9-10 min at sink, however progressed to use of forearm support end of ADL tasks suspect partly due to fatigue                 Toilet Transfer: Supervision/safety;Stand-pivot Armed forces technical officer Details (indicate cue type and reason): pt seated on BSC upon arrival, transfer back to recliner with supervision  Toileting- Clothing Manipulation and Hygiene: Supervision/safety;Set up;Sit to/from stand;Sitting/lateral lean Toileting - Clothing Manipulation Details (indicate cue type and reason): pt performing peri-care via lateral leans     Functional mobility during ADLs: Min guard;Rolling walker General ADL Comments: pt limited due to dizziness initially - assisted with toileting ADL, attempted to stand to walk to BR for grooming ADL however pt  with increased dizziness, requiring return to sitting; pt stood from recliner to perform grooming ADL and with improvements in dizziness; after seated rest break (post grooming ADL) pt able to perform additional mobility to/from door using RW      Vision       Perception     Praxis      Cognition  Arousal/Alertness: Awake/alert Behavior During Therapy: [facility] for tasks assessed/performed;Anxious                                   General Comments: pt. states that she does not feel like she is able to think as she did. Has some delauy in expressing self. Slow to respond.        Exercises     Shoulder Instructions       General Comments      Pertinent Vitals/ Pain       Pain Assessment: No/denies pain  Home Living                                          Prior Functioning/Environment              Frequency  Min 3X/week        Progress Toward Goals  OT Goals(current goals can now be found in the care plan section)  Progress towards OT goals: Progressing toward goals  Acute Rehab OT Goals Patient Stated Goal: to get back her strength and go home OT Goal Formulation: With patient Time For Goal Achievement: 03/18/19 Potential to Achieve Goals: Good ADL Goals Pt Will Perform Lower Body Bathing: with modified independence;sit to/from stand;with adaptive equipment Pt Will Perform Lower Body Dressing: with modified independence;sit to/from stand;with adaptive equipment Pt Will Transfer to Toilet: with modified independence;ambulating;bedside commode Pt Will Perform Toileting - Clothing Manipulation and hygiene: with modified independence;sit to/from stand Pt/caregiver will Perform Home Exercise Program: Independently;Increased strength;Both right and left upper extremity;With written HEP provided;With theraband Additional ADL Goal #1: Pt will independently verbalize 3 energy conservation strategies Additional ADL Goal #2: Pt will complete ADL task with SpO2 maintaining greater than 90  Plan Discharge plan remains appropriate    Co-evaluation                 AM-PAC OT "6 Clicks" Daily Activity     Outcome Measure   Help from another person eating meals?: None Help from another person taking care of personal grooming?: A  Little Help from another person toileting, which includes using toliet, bedpan, or urinal?: A Little Help from another person bathing (including washing, rinsing, drying)?: A Little Help from another person to put on and taking off regular upper body clothing?: A Little Help from another person to put on and taking off regular lower body clothing?: A Little 6 Click Score: 19    End of Session Equipment Utilized During Treatment: Rolling walker;Oxygen(2-3L )  OT Visit Diagnosis: Unsteadiness on feet (R26.81);Muscle weakness (generalized) (M62.81)   Activity Tolerance Patient tolerated treatment well   Patient Left in chair;with call bell/phone within reach   Nurse Communication Mobility status        Time: 1040-1140 OT Time Calculation (min): 60 min  Charges: OT General Charges $OT Visit: 1 Visit OT Treatments $Self Care/Home Management : 53-67 mins  Lou Cal, [place] Pager  [place] 03/07/2019, 1:27 PM

## 2019-03-07 NOTE — Progress Notes (Signed)
Pt d/c to home with daughter. Home O2 set up and pt sent with portable oxygen for transport home.  Iv removed. No pain. D/c instructions reviewed. Pt sent with electronic and paper prescriptions

## 2019-03-07 NOTE — Care Management (Signed)
Case manager confirmed that [facility] will follow patient at discharge, [name] has delivered concentrator and tanks to patients home.     Ricki Miller, RN BSN Case Manager [phone]

## 2019-03-07 NOTE — Discharge Instructions (Signed)
Person Under Monitoring Name: [name]  Location: [place] [address]   Infection Prevention Recommendations for Individuals Confirmed to have, or Being Evaluated for, 2019 Novel Coronavirus (COVID-19) Infection Who Receive Care at Home  Individuals who are confirmed to have, or are being evaluated for, COVID-19 should follow the prevention steps below until a healthcare provider or local or state health department says they can return to normal activities.  Stay home except to get medical care You should restrict activities outside your home, except for getting medical care. Do not go to work, school, or public areas, and do not use public transportation or taxis.  Call ahead before visiting your doctor Before your medical appointment, call the healthcare provider and tell them that you have, or are being evaluated for, COVID-19 infection. This will help the healthcare providers office take steps to keep other people from getting infected. Ask your healthcare provider to call the local or state health department.  Monitor your symptoms Seek prompt medical attention if your illness is worsening (e.g., difficulty breathing). Before going to your medical appointment, call the healthcare provider and tell them that you have, or are being evaluated for, COVID-19 infection. Ask your healthcare provider to call the local or state health department.  Wear a facemask You should wear a facemask that covers your nose and mouth when you are in the same room with other people and when you visit a healthcare provider. People who live with or visit you should also wear a facemask while they are in the same room with you.  Separate yourself from other people in your home As much as possible, you should stay in a different room from other people in your home. Also, you should use a separate bathroom, if available.  Avoid sharing household items You should not share  dishes, drinking glasses, cups, eating utensils, towels, bedding, or other items with other people in your home. After using these items, you should wash them thoroughly with soap and water.  Cover your coughs and sneezes Cover your mouth and nose with a tissue when you cough or sneeze, or you can cough or sneeze into your sleeve. Throw used tissues in a lined trash can, and immediately wash your hands with soap and water for at least 20 seconds or use an alcohol-based hand rub.  Wash your [facility] your hands often and thoroughly with soap and water for at least 20 seconds. You can use an alcohol-based hand sanitizer if soap and water are not available and if your hands are not visibly dirty. Avoid touching your eyes, nose, and mouth with unwashed hands.   Prevention Steps for Caregivers and Household Members of Individuals Confirmed to have, or Being Evaluated for, COVID-19 Infection Being Cared for in the Home  If you live with, or provide care at home for, a person confirmed to have, or being evaluated for, COVID-19 infection please follow these guidelines to prevent infection:  Follow healthcare providers instructions Make sure that you understand and can help the patient follow any healthcare provider instructions for all care.  Provide for the patients basic needs You should help the patient with basic needs in the home and provide support for getting groceries, prescriptions, and other personal needs.  Monitor the patients symptoms If they are getting sicker, call his or her medical provider and tell them that the patient has, or is being evaluated for, COVID-19 infection. This will help the healthcare providers  office take steps to keep other people from getting infected. Ask the healthcare provider to call the local or state health department.  Limit the number of people who have contact with the patient  If possible, have only one caregiver for the patient.  Other  household members should stay in another home or place of residence. If this is not possible, they should stay  in another room, or be separated from the patient as much as possible. Use a separate bathroom, if available.  Restrict visitors who do not have an essential need to be in the home.  Keep older adults, very young children, and other sick people away from the patient Keep older adults, very young children, and those who have compromised immune systems or chronic health conditions away from the patient. This includes people with chronic heart, lung, or kidney conditions, diabetes, and cancer.  Ensure good ventilation Make sure that shared spaces in the home have good air flow, such as from an air conditioner or an opened window, weather permitting.  Wash your hands often  Wash your hands often and thoroughly with soap and water for at least 20 seconds. You can use an alcohol based hand sanitizer if soap and water are not available and if your hands are not visibly dirty.  Avoid touching your eyes, nose, and mouth with unwashed hands.  Use disposable paper towels to dry your hands. If not available, use dedicated cloth towels and replace them when they become wet.  Wear a facemask and gloves  Wear a disposable facemask at all times in the room and gloves when you touch or have contact with the patients blood, body fluids, and/or secretions or excretions, such as sweat, saliva, sputum, nasal mucus, vomit, urine, or feces.  Ensure the mask fits over your nose and mouth tightly, and do not touch it during use.  Throw out disposable facemasks and gloves after using them. Do not reuse.  Wash your hands immediately after removing your facemask and gloves.  If your personal clothing becomes contaminated, carefully remove clothing and launder. Wash your hands after handling contaminated clothing.  Place all used disposable facemasks, gloves, and other waste in a lined container before  disposing them with other household waste.  Remove gloves and wash your hands immediately after handling these items.  Do not share dishes, glasses, or other household items with the patient  Avoid sharing household items. You should not share dishes, drinking glasses, cups, eating utensils, towels, bedding, or other items with a patient who is confirmed to have, or being evaluated for, COVID-19 infection.  After the person uses these items, you should wash them thoroughly with soap and water.  Wash laundry thoroughly  Immediately remove and wash clothes or bedding that have blood, body fluids, and/or secretions or excretions, such as sweat, saliva, sputum, nasal mucus, vomit, urine, or feces, on them.  Wear gloves when handling laundry from the patient.  Read and follow directions on labels of laundry or clothing items and detergent. In general, wash and dry with the warmest temperatures recommended on the label.  Clean all areas the individual has used often  Clean all touchable surfaces, such as counters, tabletops, doorknobs, bathroom fixtures, toilets, phones, keyboards, tablets, and bedside tables, every day. Also, clean any surfaces that may have blood, body fluids, and/or secretions or excretions on them.  Wear gloves when cleaning surfaces the patient has come in contact with.  Use a diluted bleach solution (e.g., dilute bleach with 1  part bleach and 10 parts water) or a household disinfectant with a label that says EPA-registered for coronaviruses. To make a bleach solution at home, add 1 tablespoon of bleach to 1 quart (4 cups) of water. For a larger supply, add  cup of bleach to 1 gallon (16 cups) of water.  Read labels of cleaning products and follow recommendations provided on product labels. Labels contain instructions for safe and effective use of the cleaning product including precautions you should take when applying the product, such as wearing gloves or eye protection  and making sure you have good ventilation during use of the product.  Remove gloves and wash hands immediately after cleaning.  Monitor yourself for signs and symptoms of illness Caregivers and household members are considered close contacts, should monitor their health, and will be asked to limit movement outside of the home to the extent possible. Follow the monitoring steps for close contacts listed on the symptom monitoring form.   ? If you have additional questions, contact your local health department or call the epidemiologist on call at [phone] (available 24/7). ? This guidance is subject to change. For the most up-to-date guidance from [facility], please refer to their website: YouBlogs.pl

## 2019-03-08 DIAGNOSIS — I2510 Atherosclerotic heart disease of native coronary artery without angina pectoris: Secondary | ICD-10-CM | POA: Diagnosis not present

## 2019-03-08 DIAGNOSIS — E1142 Type 2 diabetes mellitus with diabetic polyneuropathy: Secondary | ICD-10-CM | POA: Diagnosis not present

## 2019-03-08 DIAGNOSIS — J9621 Acute and chronic respiratory failure with hypoxia: Secondary | ICD-10-CM | POA: Diagnosis not present

## 2019-03-08 DIAGNOSIS — I10 Essential (primary) hypertension: Secondary | ICD-10-CM | POA: Diagnosis not present

## 2019-03-08 DIAGNOSIS — M069 Rheumatoid arthritis, unspecified: Secondary | ICD-10-CM | POA: Diagnosis not present

## 2019-03-08 DIAGNOSIS — U071 COVID-19: Secondary | ICD-10-CM | POA: Diagnosis not present

## 2019-03-08 DIAGNOSIS — E1165 Type 2 diabetes mellitus with hyperglycemia: Secondary | ICD-10-CM | POA: Diagnosis not present

## 2019-03-08 DIAGNOSIS — J45909 Unspecified asthma, uncomplicated: Secondary | ICD-10-CM | POA: Diagnosis not present

## 2019-03-08 DIAGNOSIS — J1289 Other viral pneumonia: Secondary | ICD-10-CM | POA: Diagnosis not present

## 2019-03-11 DIAGNOSIS — E1165 Type 2 diabetes mellitus with hyperglycemia: Secondary | ICD-10-CM | POA: Diagnosis not present

## 2019-03-11 DIAGNOSIS — I2510 Atherosclerotic heart disease of native coronary artery without angina pectoris: Secondary | ICD-10-CM | POA: Diagnosis not present

## 2019-03-11 DIAGNOSIS — I10 Essential (primary) hypertension: Secondary | ICD-10-CM | POA: Diagnosis not present

## 2019-03-11 DIAGNOSIS — M069 Rheumatoid arthritis, unspecified: Secondary | ICD-10-CM | POA: Diagnosis not present

## 2019-03-11 DIAGNOSIS — J45909 Unspecified asthma, uncomplicated: Secondary | ICD-10-CM | POA: Diagnosis not present

## 2019-03-11 DIAGNOSIS — J1289 Other viral pneumonia: Secondary | ICD-10-CM | POA: Diagnosis not present

## 2019-03-11 DIAGNOSIS — J9621 Acute and chronic respiratory failure with hypoxia: Secondary | ICD-10-CM | POA: Diagnosis not present

## 2019-03-11 DIAGNOSIS — E1142 Type 2 diabetes mellitus with diabetic polyneuropathy: Secondary | ICD-10-CM | POA: Diagnosis not present

## 2019-03-11 DIAGNOSIS — U071 COVID-19: Secondary | ICD-10-CM | POA: Diagnosis not present

## 2019-03-12 DIAGNOSIS — E1165 Type 2 diabetes mellitus with hyperglycemia: Secondary | ICD-10-CM | POA: Diagnosis not present

## 2019-03-12 DIAGNOSIS — I2510 Atherosclerotic heart disease of native coronary artery without angina pectoris: Secondary | ICD-10-CM | POA: Diagnosis not present

## 2019-03-12 DIAGNOSIS — J9621 Acute and chronic respiratory failure with hypoxia: Secondary | ICD-10-CM | POA: Diagnosis not present

## 2019-03-12 DIAGNOSIS — U071 COVID-19: Secondary | ICD-10-CM | POA: Diagnosis not present

## 2019-03-12 DIAGNOSIS — M069 Rheumatoid arthritis, unspecified: Secondary | ICD-10-CM | POA: Diagnosis not present

## 2019-03-12 DIAGNOSIS — I10 Essential (primary) hypertension: Secondary | ICD-10-CM | POA: Diagnosis not present

## 2019-03-12 DIAGNOSIS — J45909 Unspecified asthma, uncomplicated: Secondary | ICD-10-CM | POA: Diagnosis not present

## 2019-03-12 DIAGNOSIS — J1289 Other viral pneumonia: Secondary | ICD-10-CM | POA: Diagnosis not present

## 2019-03-12 DIAGNOSIS — E1142 Type 2 diabetes mellitus with diabetic polyneuropathy: Secondary | ICD-10-CM | POA: Diagnosis not present

## 2019-03-12 DIAGNOSIS — J4520 Mild intermittent asthma, uncomplicated: Secondary | ICD-10-CM | POA: Diagnosis not present

## 2019-03-13 DIAGNOSIS — M069 Rheumatoid arthritis, unspecified: Secondary | ICD-10-CM | POA: Diagnosis not present

## 2019-03-13 DIAGNOSIS — I2510 Atherosclerotic heart disease of native coronary artery without angina pectoris: Secondary | ICD-10-CM | POA: Diagnosis not present

## 2019-03-13 DIAGNOSIS — E1165 Type 2 diabetes mellitus with hyperglycemia: Secondary | ICD-10-CM | POA: Diagnosis not present

## 2019-03-13 DIAGNOSIS — J1289 Other viral pneumonia: Secondary | ICD-10-CM | POA: Diagnosis not present

## 2019-03-13 DIAGNOSIS — U071 COVID-19: Secondary | ICD-10-CM | POA: Diagnosis not present

## 2019-03-13 DIAGNOSIS — E1142 Type 2 diabetes mellitus with diabetic polyneuropathy: Secondary | ICD-10-CM | POA: Diagnosis not present

## 2019-03-13 DIAGNOSIS — J45909 Unspecified asthma, uncomplicated: Secondary | ICD-10-CM | POA: Diagnosis not present

## 2019-03-13 DIAGNOSIS — J9621 Acute and chronic respiratory failure with hypoxia: Secondary | ICD-10-CM | POA: Diagnosis not present

## 2019-03-13 DIAGNOSIS — I10 Essential (primary) hypertension: Secondary | ICD-10-CM | POA: Diagnosis not present

## 2019-03-14 ENCOUNTER — Telehealth: Payer: Self-pay | Admitting: Rheumatology

## 2019-03-14 DIAGNOSIS — M069 Rheumatoid arthritis, unspecified: Secondary | ICD-10-CM | POA: Diagnosis not present

## 2019-03-14 DIAGNOSIS — J441 Chronic obstructive pulmonary disease with (acute) exacerbation: Secondary | ICD-10-CM | POA: Diagnosis not present

## 2019-03-14 DIAGNOSIS — E1151 Type 2 diabetes mellitus with diabetic peripheral angiopathy without gangrene: Secondary | ICD-10-CM | POA: Diagnosis not present

## 2019-03-14 DIAGNOSIS — M0609 Rheumatoid arthritis without rheumatoid factor, multiple sites: Secondary | ICD-10-CM

## 2019-03-14 DIAGNOSIS — U071 COVID-19: Secondary | ICD-10-CM | POA: Diagnosis not present

## 2019-03-14 DIAGNOSIS — D8989 Other specified disorders involving the immune mechanism, not elsewhere classified: Secondary | ICD-10-CM | POA: Diagnosis not present

## 2019-03-14 DIAGNOSIS — Z9981 Dependence on supplemental oxygen: Secondary | ICD-10-CM | POA: Diagnosis not present

## 2019-03-14 DIAGNOSIS — I2510 Atherosclerotic heart disease of native coronary artery without angina pectoris: Secondary | ICD-10-CM | POA: Diagnosis not present

## 2019-03-14 DIAGNOSIS — J9611 Chronic respiratory failure with hypoxia: Secondary | ICD-10-CM | POA: Diagnosis not present

## 2019-03-14 DIAGNOSIS — F329 Major depressive disorder, single episode, unspecified: Secondary | ICD-10-CM | POA: Diagnosis not present

## 2019-03-14 MED ORDER — HYDROXYCHLOROQUINE SULFATE 200 MG PO TABS: 200.0000 mg | ORAL_TABLET | Freq: Two times a day (BID) | ORAL | 0 refills | Status: DC

## 2019-03-14 NOTE — Telephone Encounter (Signed)
Tammy left a voicemail stating her mom has been in the hospital with COVID.  Tammy states she was getting her medication ready for next week and noticed she is out of Plaquenil.  Tammy states her mom had a lot of bloodwork taken at the hospital which should be in her chart and requesting a refill of Plaquenil to be sent to [facility] at [facility].

## 2019-03-14 NOTE — Telephone Encounter (Signed)
Last Visit: 01/09/19 Next Visit: 06/05/19 Labs: 03/07/19 ABS Immature Granulocytes 0.10 Glucose 109 BUN 39 Calcium 8.7 Total Protein Albumin 3.4 Alk Phos. [place] Exam: 09/20/18 WNL

## 2019-03-14 NOTE — Telephone Encounter (Signed)
Patient has been in the hospital with [place]. Patient has been recently discharged. Is it okay for patient to continue PLQ? Please advise.

## 2019-03-14 NOTE — Telephone Encounter (Signed)
Okay to refill Plaquenil

## 2019-03-15 DIAGNOSIS — Z23 Encounter for immunization: Secondary | ICD-10-CM | POA: Diagnosis not present

## 2019-03-15 DIAGNOSIS — I10 Essential (primary) hypertension: Secondary | ICD-10-CM | POA: Diagnosis not present

## 2019-03-15 DIAGNOSIS — J1289 Other viral pneumonia: Secondary | ICD-10-CM | POA: Diagnosis not present

## 2019-03-15 DIAGNOSIS — U071 COVID-19: Secondary | ICD-10-CM | POA: Diagnosis not present

## 2019-03-15 DIAGNOSIS — J45909 Unspecified asthma, uncomplicated: Secondary | ICD-10-CM | POA: Diagnosis not present

## 2019-03-15 DIAGNOSIS — M069 Rheumatoid arthritis, unspecified: Secondary | ICD-10-CM | POA: Diagnosis not present

## 2019-03-15 DIAGNOSIS — E1142 Type 2 diabetes mellitus with diabetic polyneuropathy: Secondary | ICD-10-CM | POA: Diagnosis not present

## 2019-03-15 DIAGNOSIS — I2510 Atherosclerotic heart disease of native coronary artery without angina pectoris: Secondary | ICD-10-CM | POA: Diagnosis not present

## 2019-03-15 DIAGNOSIS — E1165 Type 2 diabetes mellitus with hyperglycemia: Secondary | ICD-10-CM | POA: Diagnosis not present

## 2019-03-15 DIAGNOSIS — E1151 Type 2 diabetes mellitus with diabetic peripheral angiopathy without gangrene: Secondary | ICD-10-CM | POA: Diagnosis not present

## 2019-03-15 DIAGNOSIS — J9621 Acute and chronic respiratory failure with hypoxia: Secondary | ICD-10-CM | POA: Diagnosis not present

## 2019-03-18 DIAGNOSIS — E1142 Type 2 diabetes mellitus with diabetic polyneuropathy: Secondary | ICD-10-CM | POA: Diagnosis not present

## 2019-03-18 DIAGNOSIS — U071 COVID-19: Secondary | ICD-10-CM | POA: Diagnosis not present

## 2019-03-18 DIAGNOSIS — J45909 Unspecified asthma, uncomplicated: Secondary | ICD-10-CM | POA: Diagnosis not present

## 2019-03-18 DIAGNOSIS — J9621 Acute and chronic respiratory failure with hypoxia: Secondary | ICD-10-CM | POA: Diagnosis not present

## 2019-03-18 DIAGNOSIS — I2510 Atherosclerotic heart disease of native coronary artery without angina pectoris: Secondary | ICD-10-CM | POA: Diagnosis not present

## 2019-03-18 DIAGNOSIS — I10 Essential (primary) hypertension: Secondary | ICD-10-CM | POA: Diagnosis not present

## 2019-03-18 DIAGNOSIS — J1289 Other viral pneumonia: Secondary | ICD-10-CM | POA: Diagnosis not present

## 2019-03-18 DIAGNOSIS — E1165 Type 2 diabetes mellitus with hyperglycemia: Secondary | ICD-10-CM | POA: Diagnosis not present

## 2019-03-18 DIAGNOSIS — M069 Rheumatoid arthritis, unspecified: Secondary | ICD-10-CM | POA: Diagnosis not present

## 2019-03-19 DIAGNOSIS — I2510 Atherosclerotic heart disease of native coronary artery without angina pectoris: Secondary | ICD-10-CM | POA: Diagnosis not present

## 2019-03-19 DIAGNOSIS — E1165 Type 2 diabetes mellitus with hyperglycemia: Secondary | ICD-10-CM | POA: Diagnosis not present

## 2019-03-19 DIAGNOSIS — J45909 Unspecified asthma, uncomplicated: Secondary | ICD-10-CM | POA: Diagnosis not present

## 2019-03-19 DIAGNOSIS — J9621 Acute and chronic respiratory failure with hypoxia: Secondary | ICD-10-CM | POA: Diagnosis not present

## 2019-03-19 DIAGNOSIS — U071 COVID-19: Secondary | ICD-10-CM | POA: Diagnosis not present

## 2019-03-19 DIAGNOSIS — I10 Essential (primary) hypertension: Secondary | ICD-10-CM | POA: Diagnosis not present

## 2019-03-19 DIAGNOSIS — J1289 Other viral pneumonia: Secondary | ICD-10-CM | POA: Diagnosis not present

## 2019-03-19 DIAGNOSIS — E1142 Type 2 diabetes mellitus with diabetic polyneuropathy: Secondary | ICD-10-CM | POA: Diagnosis not present

## 2019-03-19 DIAGNOSIS — M069 Rheumatoid arthritis, unspecified: Secondary | ICD-10-CM | POA: Diagnosis not present

## 2019-03-20 DIAGNOSIS — J1289 Other viral pneumonia: Secondary | ICD-10-CM | POA: Diagnosis not present

## 2019-03-20 DIAGNOSIS — J9621 Acute and chronic respiratory failure with hypoxia: Secondary | ICD-10-CM | POA: Diagnosis not present

## 2019-03-20 DIAGNOSIS — J45909 Unspecified asthma, uncomplicated: Secondary | ICD-10-CM | POA: Diagnosis not present

## 2019-03-20 DIAGNOSIS — M069 Rheumatoid arthritis, unspecified: Secondary | ICD-10-CM | POA: Diagnosis not present

## 2019-03-20 DIAGNOSIS — I2510 Atherosclerotic heart disease of native coronary artery without angina pectoris: Secondary | ICD-10-CM | POA: Diagnosis not present

## 2019-03-20 DIAGNOSIS — U071 COVID-19: Secondary | ICD-10-CM | POA: Diagnosis not present

## 2019-03-20 DIAGNOSIS — I10 Essential (primary) hypertension: Secondary | ICD-10-CM | POA: Diagnosis not present

## 2019-03-20 DIAGNOSIS — E1165 Type 2 diabetes mellitus with hyperglycemia: Secondary | ICD-10-CM | POA: Diagnosis not present

## 2019-03-20 DIAGNOSIS — E1142 Type 2 diabetes mellitus with diabetic polyneuropathy: Secondary | ICD-10-CM | POA: Diagnosis not present

## 2019-03-21 DIAGNOSIS — I2510 Atherosclerotic heart disease of native coronary artery without angina pectoris: Secondary | ICD-10-CM | POA: Diagnosis not present

## 2019-03-21 DIAGNOSIS — J45909 Unspecified asthma, uncomplicated: Secondary | ICD-10-CM | POA: Diagnosis not present

## 2019-03-21 DIAGNOSIS — I10 Essential (primary) hypertension: Secondary | ICD-10-CM | POA: Diagnosis not present

## 2019-03-21 DIAGNOSIS — M069 Rheumatoid arthritis, unspecified: Secondary | ICD-10-CM | POA: Diagnosis not present

## 2019-03-21 DIAGNOSIS — E1165 Type 2 diabetes mellitus with hyperglycemia: Secondary | ICD-10-CM | POA: Diagnosis not present

## 2019-03-21 DIAGNOSIS — U071 COVID-19: Secondary | ICD-10-CM | POA: Diagnosis not present

## 2019-03-21 DIAGNOSIS — J1289 Other viral pneumonia: Secondary | ICD-10-CM | POA: Diagnosis not present

## 2019-03-21 DIAGNOSIS — J9621 Acute and chronic respiratory failure with hypoxia: Secondary | ICD-10-CM | POA: Diagnosis not present

## 2019-03-21 DIAGNOSIS — E1142 Type 2 diabetes mellitus with diabetic polyneuropathy: Secondary | ICD-10-CM | POA: Diagnosis not present

## 2019-03-22 DIAGNOSIS — I10 Essential (primary) hypertension: Secondary | ICD-10-CM | POA: Diagnosis not present

## 2019-03-22 DIAGNOSIS — J45909 Unspecified asthma, uncomplicated: Secondary | ICD-10-CM | POA: Diagnosis not present

## 2019-03-22 DIAGNOSIS — I2510 Atherosclerotic heart disease of native coronary artery without angina pectoris: Secondary | ICD-10-CM | POA: Diagnosis not present

## 2019-03-22 DIAGNOSIS — J1289 Other viral pneumonia: Secondary | ICD-10-CM | POA: Diagnosis not present

## 2019-03-22 DIAGNOSIS — E1165 Type 2 diabetes mellitus with hyperglycemia: Secondary | ICD-10-CM | POA: Diagnosis not present

## 2019-03-22 DIAGNOSIS — M069 Rheumatoid arthritis, unspecified: Secondary | ICD-10-CM | POA: Diagnosis not present

## 2019-03-22 DIAGNOSIS — E1142 Type 2 diabetes mellitus with diabetic polyneuropathy: Secondary | ICD-10-CM | POA: Diagnosis not present

## 2019-03-22 DIAGNOSIS — U071 COVID-19: Secondary | ICD-10-CM | POA: Diagnosis not present

## 2019-03-22 DIAGNOSIS — J9621 Acute and chronic respiratory failure with hypoxia: Secondary | ICD-10-CM | POA: Diagnosis not present

## 2019-03-25 DIAGNOSIS — J9621 Acute and chronic respiratory failure with hypoxia: Secondary | ICD-10-CM | POA: Diagnosis not present

## 2019-03-25 DIAGNOSIS — E1165 Type 2 diabetes mellitus with hyperglycemia: Secondary | ICD-10-CM | POA: Diagnosis not present

## 2019-03-25 DIAGNOSIS — I10 Essential (primary) hypertension: Secondary | ICD-10-CM | POA: Diagnosis not present

## 2019-03-25 DIAGNOSIS — I2510 Atherosclerotic heart disease of native coronary artery without angina pectoris: Secondary | ICD-10-CM | POA: Diagnosis not present

## 2019-03-25 DIAGNOSIS — U071 COVID-19: Secondary | ICD-10-CM | POA: Diagnosis not present

## 2019-03-25 DIAGNOSIS — J1289 Other viral pneumonia: Secondary | ICD-10-CM | POA: Diagnosis not present

## 2019-03-25 DIAGNOSIS — J45909 Unspecified asthma, uncomplicated: Secondary | ICD-10-CM | POA: Diagnosis not present

## 2019-03-25 DIAGNOSIS — M069 Rheumatoid arthritis, unspecified: Secondary | ICD-10-CM | POA: Diagnosis not present

## 2019-03-25 DIAGNOSIS — E1142 Type 2 diabetes mellitus with diabetic polyneuropathy: Secondary | ICD-10-CM | POA: Diagnosis not present

## 2019-03-28 DIAGNOSIS — E1142 Type 2 diabetes mellitus with diabetic polyneuropathy: Secondary | ICD-10-CM | POA: Diagnosis not present

## 2019-03-28 DIAGNOSIS — J45909 Unspecified asthma, uncomplicated: Secondary | ICD-10-CM | POA: Diagnosis not present

## 2019-03-28 DIAGNOSIS — I10 Essential (primary) hypertension: Secondary | ICD-10-CM | POA: Diagnosis not present

## 2019-03-28 DIAGNOSIS — I2510 Atherosclerotic heart disease of native coronary artery without angina pectoris: Secondary | ICD-10-CM | POA: Diagnosis not present

## 2019-03-28 DIAGNOSIS — E1165 Type 2 diabetes mellitus with hyperglycemia: Secondary | ICD-10-CM | POA: Diagnosis not present

## 2019-03-28 DIAGNOSIS — J1289 Other viral pneumonia: Secondary | ICD-10-CM | POA: Diagnosis not present

## 2019-03-28 DIAGNOSIS — M069 Rheumatoid arthritis, unspecified: Secondary | ICD-10-CM | POA: Diagnosis not present

## 2019-03-28 DIAGNOSIS — U071 COVID-19: Secondary | ICD-10-CM | POA: Diagnosis not present

## 2019-03-28 DIAGNOSIS — J9621 Acute and chronic respiratory failure with hypoxia: Secondary | ICD-10-CM | POA: Diagnosis not present

## 2019-03-29 DIAGNOSIS — E1165 Type 2 diabetes mellitus with hyperglycemia: Secondary | ICD-10-CM | POA: Diagnosis not present

## 2019-03-29 DIAGNOSIS — J9621 Acute and chronic respiratory failure with hypoxia: Secondary | ICD-10-CM | POA: Diagnosis not present

## 2019-03-29 DIAGNOSIS — I10 Essential (primary) hypertension: Secondary | ICD-10-CM | POA: Diagnosis not present

## 2019-03-29 DIAGNOSIS — J1289 Other viral pneumonia: Secondary | ICD-10-CM | POA: Diagnosis not present

## 2019-03-29 DIAGNOSIS — E1142 Type 2 diabetes mellitus with diabetic polyneuropathy: Secondary | ICD-10-CM | POA: Diagnosis not present

## 2019-03-29 DIAGNOSIS — J45909 Unspecified asthma, uncomplicated: Secondary | ICD-10-CM | POA: Diagnosis not present

## 2019-03-29 DIAGNOSIS — M069 Rheumatoid arthritis, unspecified: Secondary | ICD-10-CM | POA: Diagnosis not present

## 2019-03-29 DIAGNOSIS — I2510 Atherosclerotic heart disease of native coronary artery without angina pectoris: Secondary | ICD-10-CM | POA: Diagnosis not present

## 2019-03-29 DIAGNOSIS — U071 COVID-19: Secondary | ICD-10-CM | POA: Diagnosis not present

## 2019-04-02 DIAGNOSIS — J45909 Unspecified asthma, uncomplicated: Secondary | ICD-10-CM | POA: Diagnosis not present

## 2019-04-02 DIAGNOSIS — J1289 Other viral pneumonia: Secondary | ICD-10-CM | POA: Diagnosis not present

## 2019-04-02 DIAGNOSIS — I10 Essential (primary) hypertension: Secondary | ICD-10-CM | POA: Diagnosis not present

## 2019-04-02 DIAGNOSIS — M069 Rheumatoid arthritis, unspecified: Secondary | ICD-10-CM | POA: Diagnosis not present

## 2019-04-02 DIAGNOSIS — I2510 Atherosclerotic heart disease of native coronary artery without angina pectoris: Secondary | ICD-10-CM | POA: Diagnosis not present

## 2019-04-02 DIAGNOSIS — J9621 Acute and chronic respiratory failure with hypoxia: Secondary | ICD-10-CM | POA: Diagnosis not present

## 2019-04-02 DIAGNOSIS — U071 COVID-19: Secondary | ICD-10-CM | POA: Diagnosis not present

## 2019-04-02 DIAGNOSIS — E1142 Type 2 diabetes mellitus with diabetic polyneuropathy: Secondary | ICD-10-CM | POA: Diagnosis not present

## 2019-04-02 DIAGNOSIS — E1165 Type 2 diabetes mellitus with hyperglycemia: Secondary | ICD-10-CM | POA: Diagnosis not present

## 2019-04-03 DIAGNOSIS — D8989 Other specified disorders involving the immune mechanism, not elsewhere classified: Secondary | ICD-10-CM | POA: Diagnosis not present

## 2019-04-03 DIAGNOSIS — U071 COVID-19: Secondary | ICD-10-CM | POA: Diagnosis not present

## 2019-04-03 DIAGNOSIS — I2510 Atherosclerotic heart disease of native coronary artery without angina pectoris: Secondary | ICD-10-CM | POA: Diagnosis not present

## 2019-04-03 DIAGNOSIS — E1151 Type 2 diabetes mellitus with diabetic peripheral angiopathy without gangrene: Secondary | ICD-10-CM | POA: Diagnosis not present

## 2019-04-03 DIAGNOSIS — E1165 Type 2 diabetes mellitus with hyperglycemia: Secondary | ICD-10-CM | POA: Diagnosis not present

## 2019-04-03 DIAGNOSIS — E1142 Type 2 diabetes mellitus with diabetic polyneuropathy: Secondary | ICD-10-CM | POA: Diagnosis not present

## 2019-04-03 DIAGNOSIS — J1289 Other viral pneumonia: Secondary | ICD-10-CM | POA: Diagnosis not present

## 2019-04-03 DIAGNOSIS — J441 Chronic obstructive pulmonary disease with (acute) exacerbation: Secondary | ICD-10-CM | POA: Diagnosis not present

## 2019-04-03 DIAGNOSIS — Z9981 Dependence on supplemental oxygen: Secondary | ICD-10-CM | POA: Diagnosis not present

## 2019-04-03 DIAGNOSIS — J9621 Acute and chronic respiratory failure with hypoxia: Secondary | ICD-10-CM | POA: Diagnosis not present

## 2019-04-03 DIAGNOSIS — J9611 Chronic respiratory failure with hypoxia: Secondary | ICD-10-CM | POA: Diagnosis not present

## 2019-04-03 DIAGNOSIS — M069 Rheumatoid arthritis, unspecified: Secondary | ICD-10-CM | POA: Diagnosis not present

## 2019-04-03 DIAGNOSIS — B370 Candidal stomatitis: Secondary | ICD-10-CM | POA: Diagnosis not present

## 2019-04-03 DIAGNOSIS — J45909 Unspecified asthma, uncomplicated: Secondary | ICD-10-CM | POA: Diagnosis not present

## 2019-04-03 DIAGNOSIS — I10 Essential (primary) hypertension: Secondary | ICD-10-CM | POA: Diagnosis not present

## 2019-04-05 DIAGNOSIS — I2510 Atherosclerotic heart disease of native coronary artery without angina pectoris: Secondary | ICD-10-CM | POA: Diagnosis not present

## 2019-04-05 DIAGNOSIS — J45909 Unspecified asthma, uncomplicated: Secondary | ICD-10-CM | POA: Diagnosis not present

## 2019-04-05 DIAGNOSIS — U071 COVID-19: Secondary | ICD-10-CM | POA: Diagnosis not present

## 2019-04-05 DIAGNOSIS — M069 Rheumatoid arthritis, unspecified: Secondary | ICD-10-CM | POA: Diagnosis not present

## 2019-04-05 DIAGNOSIS — J1289 Other viral pneumonia: Secondary | ICD-10-CM | POA: Diagnosis not present

## 2019-04-05 DIAGNOSIS — E1142 Type 2 diabetes mellitus with diabetic polyneuropathy: Secondary | ICD-10-CM | POA: Diagnosis not present

## 2019-04-05 DIAGNOSIS — I10 Essential (primary) hypertension: Secondary | ICD-10-CM | POA: Diagnosis not present

## 2019-04-05 DIAGNOSIS — J9621 Acute and chronic respiratory failure with hypoxia: Secondary | ICD-10-CM | POA: Diagnosis not present

## 2019-04-05 DIAGNOSIS — E1165 Type 2 diabetes mellitus with hyperglycemia: Secondary | ICD-10-CM | POA: Diagnosis not present

## 2019-04-06 DIAGNOSIS — R0602 Shortness of breath: Secondary | ICD-10-CM | POA: Diagnosis not present

## 2019-04-06 DIAGNOSIS — J9621 Acute and chronic respiratory failure with hypoxia: Secondary | ICD-10-CM | POA: Diagnosis not present

## 2019-04-06 DIAGNOSIS — U071 COVID-19: Secondary | ICD-10-CM | POA: Diagnosis not present

## 2019-04-06 DIAGNOSIS — J1289 Other viral pneumonia: Secondary | ICD-10-CM | POA: Diagnosis not present

## 2019-04-09 DIAGNOSIS — I2510 Atherosclerotic heart disease of native coronary artery without angina pectoris: Secondary | ICD-10-CM | POA: Diagnosis not present

## 2019-04-09 DIAGNOSIS — J1289 Other viral pneumonia: Secondary | ICD-10-CM | POA: Diagnosis not present

## 2019-04-09 DIAGNOSIS — E1165 Type 2 diabetes mellitus with hyperglycemia: Secondary | ICD-10-CM | POA: Diagnosis not present

## 2019-04-09 DIAGNOSIS — J45909 Unspecified asthma, uncomplicated: Secondary | ICD-10-CM | POA: Diagnosis not present

## 2019-04-09 DIAGNOSIS — I10 Essential (primary) hypertension: Secondary | ICD-10-CM | POA: Diagnosis not present

## 2019-04-09 DIAGNOSIS — J9621 Acute and chronic respiratory failure with hypoxia: Secondary | ICD-10-CM | POA: Diagnosis not present

## 2019-04-09 DIAGNOSIS — U071 COVID-19: Secondary | ICD-10-CM | POA: Diagnosis not present

## 2019-04-09 DIAGNOSIS — E1142 Type 2 diabetes mellitus with diabetic polyneuropathy: Secondary | ICD-10-CM | POA: Diagnosis not present

## 2019-04-09 DIAGNOSIS — M069 Rheumatoid arthritis, unspecified: Secondary | ICD-10-CM | POA: Diagnosis not present

## 2019-04-11 DIAGNOSIS — U071 COVID-19: Secondary | ICD-10-CM | POA: Diagnosis not present

## 2019-04-11 DIAGNOSIS — I10 Essential (primary) hypertension: Secondary | ICD-10-CM | POA: Diagnosis not present

## 2019-04-11 DIAGNOSIS — J9621 Acute and chronic respiratory failure with hypoxia: Secondary | ICD-10-CM | POA: Diagnosis not present

## 2019-04-11 DIAGNOSIS — I2510 Atherosclerotic heart disease of native coronary artery without angina pectoris: Secondary | ICD-10-CM | POA: Diagnosis not present

## 2019-04-11 DIAGNOSIS — J1289 Other viral pneumonia: Secondary | ICD-10-CM | POA: Diagnosis not present

## 2019-04-11 DIAGNOSIS — J45909 Unspecified asthma, uncomplicated: Secondary | ICD-10-CM | POA: Diagnosis not present

## 2019-04-11 DIAGNOSIS — E1142 Type 2 diabetes mellitus with diabetic polyneuropathy: Secondary | ICD-10-CM | POA: Diagnosis not present

## 2019-04-11 DIAGNOSIS — M069 Rheumatoid arthritis, unspecified: Secondary | ICD-10-CM | POA: Diagnosis not present

## 2019-04-11 DIAGNOSIS — E1165 Type 2 diabetes mellitus with hyperglycemia: Secondary | ICD-10-CM | POA: Diagnosis not present

## 2019-04-16 DIAGNOSIS — J9621 Acute and chronic respiratory failure with hypoxia: Secondary | ICD-10-CM | POA: Diagnosis not present

## 2019-04-16 DIAGNOSIS — I2510 Atherosclerotic heart disease of native coronary artery without angina pectoris: Secondary | ICD-10-CM | POA: Diagnosis not present

## 2019-04-16 DIAGNOSIS — U071 COVID-19: Secondary | ICD-10-CM | POA: Diagnosis not present

## 2019-04-16 DIAGNOSIS — E1142 Type 2 diabetes mellitus with diabetic polyneuropathy: Secondary | ICD-10-CM | POA: Diagnosis not present

## 2019-04-16 DIAGNOSIS — I10 Essential (primary) hypertension: Secondary | ICD-10-CM | POA: Diagnosis not present

## 2019-04-16 DIAGNOSIS — M069 Rheumatoid arthritis, unspecified: Secondary | ICD-10-CM | POA: Diagnosis not present

## 2019-04-16 DIAGNOSIS — J45909 Unspecified asthma, uncomplicated: Secondary | ICD-10-CM | POA: Diagnosis not present

## 2019-04-16 DIAGNOSIS — J1289 Other viral pneumonia: Secondary | ICD-10-CM | POA: Diagnosis not present

## 2019-04-16 DIAGNOSIS — E1165 Type 2 diabetes mellitus with hyperglycemia: Secondary | ICD-10-CM | POA: Diagnosis not present

## 2019-04-17 DIAGNOSIS — E1142 Type 2 diabetes mellitus with diabetic polyneuropathy: Secondary | ICD-10-CM | POA: Diagnosis not present

## 2019-04-17 DIAGNOSIS — J1289 Other viral pneumonia: Secondary | ICD-10-CM | POA: Diagnosis not present

## 2019-04-17 DIAGNOSIS — U071 COVID-19: Secondary | ICD-10-CM | POA: Diagnosis not present

## 2019-04-17 DIAGNOSIS — I10 Essential (primary) hypertension: Secondary | ICD-10-CM | POA: Diagnosis not present

## 2019-04-17 DIAGNOSIS — E1165 Type 2 diabetes mellitus with hyperglycemia: Secondary | ICD-10-CM | POA: Diagnosis not present

## 2019-04-17 DIAGNOSIS — J9621 Acute and chronic respiratory failure with hypoxia: Secondary | ICD-10-CM | POA: Diagnosis not present

## 2019-04-17 DIAGNOSIS — M069 Rheumatoid arthritis, unspecified: Secondary | ICD-10-CM | POA: Diagnosis not present

## 2019-04-17 DIAGNOSIS — I2510 Atherosclerotic heart disease of native coronary artery without angina pectoris: Secondary | ICD-10-CM | POA: Diagnosis not present

## 2019-04-17 DIAGNOSIS — J45909 Unspecified asthma, uncomplicated: Secondary | ICD-10-CM | POA: Diagnosis not present

## 2019-04-18 DIAGNOSIS — I10 Essential (primary) hypertension: Secondary | ICD-10-CM | POA: Diagnosis not present

## 2019-04-18 DIAGNOSIS — E1142 Type 2 diabetes mellitus with diabetic polyneuropathy: Secondary | ICD-10-CM | POA: Diagnosis not present

## 2019-04-18 DIAGNOSIS — J1289 Other viral pneumonia: Secondary | ICD-10-CM | POA: Diagnosis not present

## 2019-04-18 DIAGNOSIS — U071 COVID-19: Secondary | ICD-10-CM | POA: Diagnosis not present

## 2019-04-18 DIAGNOSIS — J45909 Unspecified asthma, uncomplicated: Secondary | ICD-10-CM | POA: Diagnosis not present

## 2019-04-18 DIAGNOSIS — E1165 Type 2 diabetes mellitus with hyperglycemia: Secondary | ICD-10-CM | POA: Diagnosis not present

## 2019-04-18 DIAGNOSIS — I2510 Atherosclerotic heart disease of native coronary artery without angina pectoris: Secondary | ICD-10-CM | POA: Diagnosis not present

## 2019-04-18 DIAGNOSIS — M069 Rheumatoid arthritis, unspecified: Secondary | ICD-10-CM | POA: Diagnosis not present

## 2019-04-18 DIAGNOSIS — J9621 Acute and chronic respiratory failure with hypoxia: Secondary | ICD-10-CM | POA: Diagnosis not present

## 2019-04-19 DIAGNOSIS — E7849 Other hyperlipidemia: Secondary | ICD-10-CM | POA: Diagnosis not present

## 2019-04-19 DIAGNOSIS — E1151 Type 2 diabetes mellitus with diabetic peripheral angiopathy without gangrene: Secondary | ICD-10-CM | POA: Diagnosis not present

## 2019-04-22 DIAGNOSIS — E1142 Type 2 diabetes mellitus with diabetic polyneuropathy: Secondary | ICD-10-CM | POA: Diagnosis not present

## 2019-04-22 DIAGNOSIS — J9621 Acute and chronic respiratory failure with hypoxia: Secondary | ICD-10-CM | POA: Diagnosis not present

## 2019-04-22 DIAGNOSIS — U071 COVID-19: Secondary | ICD-10-CM | POA: Diagnosis not present

## 2019-04-22 DIAGNOSIS — M069 Rheumatoid arthritis, unspecified: Secondary | ICD-10-CM | POA: Diagnosis not present

## 2019-04-22 DIAGNOSIS — J1289 Other viral pneumonia: Secondary | ICD-10-CM | POA: Diagnosis not present

## 2019-04-22 DIAGNOSIS — E1165 Type 2 diabetes mellitus with hyperglycemia: Secondary | ICD-10-CM | POA: Diagnosis not present

## 2019-04-22 DIAGNOSIS — I2510 Atherosclerotic heart disease of native coronary artery without angina pectoris: Secondary | ICD-10-CM | POA: Diagnosis not present

## 2019-04-22 DIAGNOSIS — I10 Essential (primary) hypertension: Secondary | ICD-10-CM | POA: Diagnosis not present

## 2019-04-22 DIAGNOSIS — J45909 Unspecified asthma, uncomplicated: Secondary | ICD-10-CM | POA: Diagnosis not present

## 2019-04-24 DIAGNOSIS — J45909 Unspecified asthma, uncomplicated: Secondary | ICD-10-CM | POA: Diagnosis not present

## 2019-04-24 DIAGNOSIS — I2510 Atherosclerotic heart disease of native coronary artery without angina pectoris: Secondary | ICD-10-CM | POA: Diagnosis not present

## 2019-04-24 DIAGNOSIS — U071 COVID-19: Secondary | ICD-10-CM | POA: Diagnosis not present

## 2019-04-24 DIAGNOSIS — J1289 Other viral pneumonia: Secondary | ICD-10-CM | POA: Diagnosis not present

## 2019-04-24 DIAGNOSIS — I10 Essential (primary) hypertension: Secondary | ICD-10-CM | POA: Diagnosis not present

## 2019-04-24 DIAGNOSIS — E1142 Type 2 diabetes mellitus with diabetic polyneuropathy: Secondary | ICD-10-CM | POA: Diagnosis not present

## 2019-04-24 DIAGNOSIS — E1165 Type 2 diabetes mellitus with hyperglycemia: Secondary | ICD-10-CM | POA: Diagnosis not present

## 2019-04-24 DIAGNOSIS — M069 Rheumatoid arthritis, unspecified: Secondary | ICD-10-CM | POA: Diagnosis not present

## 2019-04-24 DIAGNOSIS — J9621 Acute and chronic respiratory failure with hypoxia: Secondary | ICD-10-CM | POA: Diagnosis not present

## 2019-04-29 DIAGNOSIS — U071 COVID-19: Secondary | ICD-10-CM | POA: Diagnosis not present

## 2019-04-29 DIAGNOSIS — E1142 Type 2 diabetes mellitus with diabetic polyneuropathy: Secondary | ICD-10-CM | POA: Diagnosis not present

## 2019-04-29 DIAGNOSIS — J45909 Unspecified asthma, uncomplicated: Secondary | ICD-10-CM | POA: Diagnosis not present

## 2019-04-29 DIAGNOSIS — E1165 Type 2 diabetes mellitus with hyperglycemia: Secondary | ICD-10-CM | POA: Diagnosis not present

## 2019-04-29 DIAGNOSIS — I10 Essential (primary) hypertension: Secondary | ICD-10-CM | POA: Diagnosis not present

## 2019-04-29 DIAGNOSIS — J9621 Acute and chronic respiratory failure with hypoxia: Secondary | ICD-10-CM | POA: Diagnosis not present

## 2019-04-29 DIAGNOSIS — J1289 Other viral pneumonia: Secondary | ICD-10-CM | POA: Diagnosis not present

## 2019-04-29 DIAGNOSIS — I2510 Atherosclerotic heart disease of native coronary artery without angina pectoris: Secondary | ICD-10-CM | POA: Diagnosis not present

## 2019-04-29 DIAGNOSIS — M069 Rheumatoid arthritis, unspecified: Secondary | ICD-10-CM | POA: Diagnosis not present

## 2019-04-30 DIAGNOSIS — J9621 Acute and chronic respiratory failure with hypoxia: Secondary | ICD-10-CM | POA: Diagnosis not present

## 2019-04-30 DIAGNOSIS — M069 Rheumatoid arthritis, unspecified: Secondary | ICD-10-CM | POA: Diagnosis not present

## 2019-04-30 DIAGNOSIS — E1165 Type 2 diabetes mellitus with hyperglycemia: Secondary | ICD-10-CM | POA: Diagnosis not present

## 2019-04-30 DIAGNOSIS — I2510 Atherosclerotic heart disease of native coronary artery without angina pectoris: Secondary | ICD-10-CM | POA: Diagnosis not present

## 2019-04-30 DIAGNOSIS — E1142 Type 2 diabetes mellitus with diabetic polyneuropathy: Secondary | ICD-10-CM | POA: Diagnosis not present

## 2019-04-30 DIAGNOSIS — J1289 Other viral pneumonia: Secondary | ICD-10-CM | POA: Diagnosis not present

## 2019-04-30 DIAGNOSIS — I10 Essential (primary) hypertension: Secondary | ICD-10-CM | POA: Diagnosis not present

## 2019-04-30 DIAGNOSIS — U071 COVID-19: Secondary | ICD-10-CM | POA: Diagnosis not present

## 2019-04-30 DIAGNOSIS — J45909 Unspecified asthma, uncomplicated: Secondary | ICD-10-CM | POA: Diagnosis not present

## 2019-05-06 DIAGNOSIS — R0602 Shortness of breath: Secondary | ICD-10-CM | POA: Diagnosis not present

## 2019-05-06 DIAGNOSIS — J1289 Other viral pneumonia: Secondary | ICD-10-CM | POA: Diagnosis not present

## 2019-05-06 DIAGNOSIS — J9621 Acute and chronic respiratory failure with hypoxia: Secondary | ICD-10-CM | POA: Diagnosis not present

## 2019-05-06 DIAGNOSIS — U071 COVID-19: Secondary | ICD-10-CM | POA: Diagnosis not present

## 2019-05-22 NOTE — Progress Notes (Signed)
Office Visit Note  Patient: [name]             Date of Birth: [birth year]           MRN: [id number]             PCP: Crist Infante, MD Referring: Crist Infante, MD Visit Date: 06/05/2019 Occupation: @GUAROCC @  Subjective:  Pain in both hands   History of Present Illness: [name] is a 73 y.o. female with history of seronegative rheumatoid arthritis.  She is taking Plaquenil 1 tablet by mouth twice daily. She denies any recent rheumatoid arthritis flares.  She is having pain in both hands band has intermittent swelling in both hands.  She has been taking mobic 15 mg po about twice daily.  She takes baclofen and tylenol at bedtime.  She takes Gabapentin as prescribed.  Patient reports she was diagnosed with COVID-19 on 02/24/19 and was hospitalized for the treatment of secondary pneumonia.  She remains on oxygen.    Activities of Daily Living:  Patient reports morning stiffness for 2 hours.   Patient Denies nocturnal pain.  Difficulty dressing/grooming: Denies Difficulty climbing stairs: Reports Difficulty getting out of chair: Reports Difficulty using hands for taps, buttons, cutlery, and/or writing: Reports  Review of Systems  Constitutional: Positive for fatigue.  HENT: Positive for mouth dryness. Negative for mouth sores and nose dryness.   Eyes: Negative for pain, visual disturbance and dryness.  Respiratory: Positive for shortness of breath (On Oxygen 2L). Negative for cough, hemoptysis and difficulty breathing.   Cardiovascular: Negative for chest pain, palpitations, hypertension and swelling in legs/feet.  Gastrointestinal: Negative for blood in stool, constipation and diarrhea.  Endocrine: Negative for increased urination.  Genitourinary: Negative for painful urination.  Musculoskeletal: Positive for arthralgias, joint pain, joint swelling and morning stiffness. Negative for myalgias, muscle weakness, muscle tenderness and myalgias.  Skin: Positive for ulcers (Left  LE). Negative for color change, pallor, rash, hair loss, nodules/bumps, skin tightness and sensitivity to sunlight.  Allergic/Immunologic: Negative for susceptible to infections.  Neurological: Positive for tremors and memory loss. Negative for dizziness, numbness, headaches and weakness.  Hematological: Negative for swollen glands.  Psychiatric/Behavioral: Positive for depressed mood. Negative for sleep disturbance. The patient is not nervous/anxious.     PMFS History:  Patient Active Problem List   Diagnosis Date Noted  . Acute on chronic respiratory failure with hypoxia ([place]) 02/28/2019  . OSA on CPAP 02/28/2019  . Diabetes mellitus type 2, uncontrolled, with complications ([place]) [id number]  . Breast cancer ([place]) 02/27/2019  . History of cancer chemotherapy 02/27/2019  . History of radiation therapy 02/27/2019  . Diabetic neuropathy ([place]) 02/27/2019  . HLD (hyperlipidemia) 02/27/2019  . Essential hypertension 02/27/2019  . QT prolongation 02/27/2019  . Pneumonia due to COVID-19 virus 02/24/2019  . Diarrhea   . Benign neoplasm of cecum   . Abdominal pain, epigastric   . Gastroesophageal reflux disease   . Dependence on nocturnal oxygen therapy 07/10/2017  . Open wound of left lower leg 12/26/2016  . Vitamin D deficiency 09/14/2016  . History of total knee replacement, bilateral 09/14/2016  . DJD (degenerative joint disease), cervical 09/14/2016  . 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 High risk medication use 05/20/2016  . Spinal stenosis, lumbar region, with neurogenic claudication 09/08/2015    Class: Chronic  . OSA (obstructive sleep apnea) 10/17/2013  . Dyspnea 09/04/2013  . Atypical chest pain 09/04/2013  . Morbid obesity ([place]) 03/21/2012  .  Cancer of lower-outer quadrant of female breast ([place]) 03/15/2012  . Diabetes mellitus, type II ([place]) 02/22/2011  . Coronary artery disease   . Hypertension   . Hyperlipidemia   . OA  (osteoarthritis) of knee     Past Medical History:  Diagnosis Date  . Actinic keratosis   . Asthma    Albuterol inhaler as needed.Pulmicort neb as needed  . Asthma   . Breast cancer ([place])    right - lumpectomy   . Chronic back pain    spinal stenosis  . Coronary artery disease   . Dependence on nocturnal oxygen therapy 07/10/2017   Pt uses 2 liters 02 at night   . Depression    takes CYmbalta and Wellbutrin daily  . Diabetes mellitus    not on any meds/controlled by diet  . Dyspnea    with exertion occasionally when lies down  . GERD (gastroesophageal reflux disease)    takes Omeprazole daily  . Headache    oocasionally  . History of kidney stones   . Hyperlipidemia    not on any meds  . Hypertension    takes Benazepril,Bystolic,and Amlodipine daily  . IBS (irritable bowel syndrome)   . Joint pain   . Joint swelling   . Livedoid vasculitis   . Nocturia   . OA (osteoarthritis) of knee   . OSA (obstructive sleep apnea)   . Other seborrheic keratosis   . Oxygen deficiency    2 liters at night per pt for OSA- no cpap   . Peripheral edema    takes daily as needed  . Peripheral neuropathy   . Personal history of chemotherapy   . Personal history of radiation therapy   . Pneumonia    hx of-several yrs ago  . RA (rheumatoid arthritis) ([place])   . Sleep apnea    pt states she uses oxygen at night - no cpap- uses 02 2 liters at night   . Urinary frequency   . Urinary urgency   . Weakness    numbness and tingling mainly in left leg occasionally in right.Tingling/numbness in hands    Family History  Problem Relation Age of Onset  . Heart disease Mother   . Cancer Father   . Cancer Brother   . Heart disease Brother    Past Surgical History:  Procedure Laterality Date  . BREAST LUMPECTOMY  1997   RIGHT BREAST  . CARDIAC CATHETERIZATION  04/23/2004   EF 60%  . cataract surgery Bilateral   . COLONOSCOPY    . COLONOSCOPY WITH PROPOFOL N/A 08/07/2017   Procedure:  COLONOSCOPY WITH PROPOFOL;  Surgeon: Ladene Artist, MD;  Location: [facility];  Service: Endoscopy;  Laterality: N/A;  . CORONARY ARTERY BYPASS GRAFT  2005   LIMA GRAFT TO LAD, SAPHENOUS VEIN GRAFT TO THE FIRST DIAGONAL, AND LEFT RADIAL ARTERY GRAFT TO THE OM  . ESOPHAGOGASTRODUODENOSCOPY (EGD) WITH PROPOFOL N/A 08/07/2017   Procedure: ESOPHAGOGASTRODUODENOSCOPY (EGD) WITH PROPOFOL;  Surgeon: Ladene Artist, MD;  [facility];  Service: Endoscopy;  Laterality: N/A;  . FOOT SURGERY Right   . JOINT REPLACEMENT    . LITHOTRIPSY    . LUMBAR LAMINECTOMY/DECOMPRESSION MICRODISCECTOMY N/A 09/08/2015   Procedure: CENTRAL DECOMPRESSIVE LUMBAR LAMINECTOMIES L3-4, L4-5 AND BILATERAL HEMILAMINECTOMY L5-S1;  Surgeon: Jessy Oto, MD;  Location: [facility];  Service: Orthopedics;  Laterality: N/A;  . nodule removed from left elbow    . SHOULDER ARTHROSCOPY    . TOTAL KNEE ARTHROPLASTY Bilateral   .  TOTAL SHOULDER ARTHROPLASTY    . TRIPLE BYPASS  04/27/05  . US ECHOCARDIOGRAPHY  01/06/2009   EF 55-60%  . WRIST SURGERY Left    Social History   Social History Narrative  . Not on file   Immunization History  Administered Date(s) Administered  . Influenza Split 04/24/2013  . Zoster Recombinat (Shingrix) 10/11/2017, 12/22/2017     Objective: Vital Signs: BP 122/74 (BP Location: Left Arm, Patient Position: Sitting, Cuff Size: Large)   Pulse 67   Resp 15   Ht 5\' 4"  (1.626 m)   Wt 234 lb 12.8 oz (106.5 kg)   BMI 40.30 kg/m    Physical Exam Vitals signs and nursing note reviewed.  Constitutional:      Appearance: She is well-developed.  HENT:     Head: Normocephalic and atraumatic.  Eyes:     Conjunctiva/sclera: Conjunctivae normal.  Neck:     Musculoskeletal: Normal range of motion.  Cardiovascular:     Rate and Rhythm: Normal rate and regular rhythm.     Heart sounds: Normal heart sounds.  Pulmonary:     Effort: Pulmonary effort is normal.     Breath sounds: Normal breath  sounds.  Abdominal:     General: Bowel sounds are normal.     Palpations: Abdomen is soft.  Lymphadenopathy:     Cervical: No cervical adenopathy.  Skin:    General: Skin is warm and dry.     Capillary Refill: Capillary refill takes less than 2 seconds.     Comments: Ulcer present on anterolateral aspect of the left lower extremity.   Neurological:     Mental Status: She is alert and oriented to person, place, and time.  Psychiatric:        Behavior: Behavior normal.      Musculoskeletal Exam: C-spine limited ROM.  Thoracic kyphosis.  Right shoulder abduction to about 70 degrees.  Left shoulder good ROM with no discomfort. Elbow joints, wrist joints, MCPs, PIPs, and DIPs good ROM with no synovitis.  PIP and DIP synovial thickening consistent with osteoarthritis of both hands.  CMC joint synovial thickening bilaterally.  Knee joints good ROM with no discomfort.  No warmth or effusion of knee joints.  No tenderness or swelling of ankle joints.   CDAI Exam: CDAI Score: - Patient Global: -; Provider Global: - Swollen: -; Tender: - Joint Exam   No joint exam has been documented for this visit   There is currently no information documented on the homunculus. Go to the Rheumatology activity and complete the homunculus joint exam.  Investigation: No additional findings.  Imaging: No results found.  Recent Labs: Lab Results  Component Value Date   WBC 9.6 03/07/2019   HGB 14.4 03/07/2019   PLT 221 03/07/2019   NA 135 03/07/2019   K 4.5 03/07/2019   CL 100 03/07/2019   CO2 23 03/07/2019   GLUCOSE 109 (H) 03/07/2019   BUN 39 (H) 03/07/2019   CREATININE 0.70 03/07/2019   BILITOT 0.6 03/07/2019   ALKPHOS 36 (L) 03/07/2019   AST 22 03/07/2019   ALT 41 03/07/2019   PROT 6.1 (L) 03/07/2019   ALBUMIN 3.4 (L) 03/07/2019   CALCIUM 8.7 (L) 03/07/2019   GFRAA >60 03/07/2019    Speciality Comments: PLQ Eye Exam: 05/30/2019 WNL @ [facility].  Procedures:  No procedures  performed Allergies: Statins and Morphine and related      Assessment / Plan:     Visit Diagnoses: Rheumatoid arthritis of multiple  sites with negative rheumatoid factor ([place]) - She was treated with methotrexate and Plaquenil in the past.  Methotrexate was discontinued last year due to non-healin lower extremity ulcer.  She has no synovitis on exam.  She has not had any recent rheumatoid arthritis flares.  She is clinically doing well on Plaquenil 200 mg 1 tablet by mouth BID as monotherapy.  She has PIP and DIP synovial thickening consistent with osteoarthritis of both hands.  She will continue taking PLQ as prescribed.  A refill was sent to the pharmacy today.  She was advised to notify us if she develops increased joint pain or joint swelling.  She will follow up in 5 months.- Plan: hydroxychloroquine (PLAQUENIL) 200 MG tablet  High risk medication use - PLQ 200 mg 1 tablet po bid, MTX was discontinued in July 2019 due to lower extremity ulcers. Plaquenil eye exam normal on 05/30/19 performed by Dr. Bing Plume.  CBC and CMP were drawn on 03/07/19.   She was diagnosed with COVID-19 on 02/24/19 and was hospitalized for the treatment of pneumonia secondary to covid.  She is currently on 2L of oxygen. She worked with home PT and OT initially upon discharged.  She will be following up with her pulmonologist in December 2020.   Ulcer of left lower extremity, limited to breakdown of skin [facility]) - Patient has been followed by her PCP and dermatologist.   DDD (degenerative disc disease), cervical: She has limited ROM with discomfort. No symptoms of radiculopathy noted.   DDD (degenerative disc disease), lumbar: She has chronic lower back pain.  She takes tylenol and baclofen at bedtime.   History of total knee replacement, bilateral: Doing well. She has good ROM with no discomfort.  No warmth or effusion noted.   Other medical conditions are listed as follows:   Varicose veins of bilateral lower extremities  with other complications  Pedal edema  History of diabetes mellitus  History of vitamin D deficiency  History of hypertension  History of sleep apnea  History of coronary artery disease  History of breast cancer    Orders: No orders of the defined types were placed in this encounter.  Meds ordered this encounter  Medications  . hydroxychloroquine (PLAQUENIL) 200 MG tablet    Sig: Take 1 tablet (200 mg total) by mouth 2 (two) times daily.    Dispense:  180 tablet    Refill:  0    Rheumatoid Arthritis      Follow-Up Instructions: Return in about 5 months (around 11/03/2019) for Rheumatoid arthritis, DDD.   Ofilia Neas, PA-C   I examined and evaluated the patient with Hazel Sams PA.  Patient is clinically doing well on Plaquenil.  She had no synovitis on examination.  She is oxygen dependent now.  She has been followed by pulmonologist.   The plan of care was discussed as noted above.  Bo Merino, MD  Note - This record has been created using Editor, commissioning.  Chart creation errors have been sought, but may not always  have been located. Such creation errors do not reflect on  the standard of medical care.

## 2019-05-30 DIAGNOSIS — E119 Type 2 diabetes mellitus without complications: Secondary | ICD-10-CM | POA: Diagnosis not present

## 2019-05-30 DIAGNOSIS — H35371 Puckering of macula, right eye: Secondary | ICD-10-CM | POA: Diagnosis not present

## 2019-05-30 DIAGNOSIS — H04123 Dry eye syndrome of bilateral lacrimal glands: Secondary | ICD-10-CM | POA: Diagnosis not present

## 2019-05-30 LAB — HM DIABETES EYE EXAM

## 2019-06-05 ENCOUNTER — Ambulatory Visit: Payer: Medicare HMO | Admitting: Rheumatology

## 2019-06-05 ENCOUNTER — Encounter: Payer: Self-pay | Admitting: Rheumatology

## 2019-06-05 ENCOUNTER — Other Ambulatory Visit: Payer: Self-pay

## 2019-06-05 VITALS — BP 122/74 | HR 67 | Resp 15 | Ht 64.0 in | Wt 234.8 lb

## 2019-06-05 DIAGNOSIS — Z8639 Personal history of other endocrine, nutritional and metabolic disease: Secondary | ICD-10-CM | POA: Diagnosis not present

## 2019-06-05 DIAGNOSIS — R600 Localized edema: Secondary | ICD-10-CM | POA: Diagnosis not present

## 2019-06-05 DIAGNOSIS — M5030 Other cervical disc degeneration, unspecified cervical region: Secondary | ICD-10-CM | POA: Diagnosis not present

## 2019-06-05 DIAGNOSIS — M0609 Rheumatoid arthritis without rheumatoid factor, multiple sites: Secondary | ICD-10-CM | POA: Diagnosis not present

## 2019-06-05 DIAGNOSIS — M5136 Other intervertebral disc degeneration, lumbar region: Secondary | ICD-10-CM | POA: Diagnosis not present

## 2019-06-05 DIAGNOSIS — I83893 Varicose veins of bilateral lower extremities with other complications: Secondary | ICD-10-CM | POA: Diagnosis not present

## 2019-06-05 DIAGNOSIS — Z96653 Presence of artificial knee joint, bilateral: Secondary | ICD-10-CM

## 2019-06-05 DIAGNOSIS — Z8679 Personal history of other diseases of the circulatory system: Secondary | ICD-10-CM

## 2019-06-05 DIAGNOSIS — L97921 Non-pressure chronic ulcer of unspecified part of left lower leg limited to breakdown of skin: Secondary | ICD-10-CM | POA: Diagnosis not present

## 2019-06-05 DIAGNOSIS — Z8669 Personal history of other diseases of the nervous system and sense organs: Secondary | ICD-10-CM

## 2019-06-05 DIAGNOSIS — Z853 Personal history of malignant neoplasm of breast: Secondary | ICD-10-CM

## 2019-06-05 DIAGNOSIS — Z79899 Other long term (current) drug therapy: Secondary | ICD-10-CM

## 2019-06-05 MED ORDER — HYDROXYCHLOROQUINE SULFATE 200 MG PO TABS: 200.0000 mg | ORAL_TABLET | Freq: Two times a day (BID) | ORAL | 0 refills | Status: DC

## 2019-06-06 DIAGNOSIS — U071 COVID-19: Secondary | ICD-10-CM | POA: Diagnosis not present

## 2019-06-06 DIAGNOSIS — R0602 Shortness of breath: Secondary | ICD-10-CM | POA: Diagnosis not present

## 2019-06-06 DIAGNOSIS — J1289 Other viral pneumonia: Secondary | ICD-10-CM | POA: Diagnosis not present

## 2019-06-06 DIAGNOSIS — J9621 Acute and chronic respiratory failure with hypoxia: Secondary | ICD-10-CM | POA: Diagnosis not present

## 2019-06-26 DIAGNOSIS — Z6841 Body Mass Index (BMI) 40.0 and over, adult: Secondary | ICD-10-CM | POA: Diagnosis not present

## 2019-06-26 DIAGNOSIS — Z124 Encounter for screening for malignant neoplasm of cervix: Secondary | ICD-10-CM | POA: Diagnosis not present

## 2019-06-27 DIAGNOSIS — Z779 Other contact with and (suspected) exposures hazardous to health: Secondary | ICD-10-CM | POA: Diagnosis not present

## 2019-07-03 DIAGNOSIS — E1151 Type 2 diabetes mellitus with diabetic peripheral angiopathy without gangrene: Secondary | ICD-10-CM | POA: Diagnosis not present

## 2019-07-03 DIAGNOSIS — E7849 Other hyperlipidemia: Secondary | ICD-10-CM | POA: Diagnosis not present

## 2019-07-03 DIAGNOSIS — Z79899 Other long term (current) drug therapy: Secondary | ICD-10-CM | POA: Diagnosis not present

## 2019-07-06 DIAGNOSIS — U071 COVID-19: Secondary | ICD-10-CM | POA: Diagnosis not present

## 2019-07-06 DIAGNOSIS — R0602 Shortness of breath: Secondary | ICD-10-CM | POA: Diagnosis not present

## 2019-07-06 DIAGNOSIS — J1289 Other viral pneumonia: Secondary | ICD-10-CM | POA: Diagnosis not present

## 2019-07-06 DIAGNOSIS — J9621 Acute and chronic respiratory failure with hypoxia: Secondary | ICD-10-CM | POA: Diagnosis not present

## 2019-07-10 DIAGNOSIS — M069 Rheumatoid arthritis, unspecified: Secondary | ICD-10-CM | POA: Diagnosis not present

## 2019-07-10 DIAGNOSIS — J441 Chronic obstructive pulmonary disease with (acute) exacerbation: Secondary | ICD-10-CM | POA: Diagnosis not present

## 2019-07-10 DIAGNOSIS — Z9981 Dependence on supplemental oxygen: Secondary | ICD-10-CM | POA: Diagnosis not present

## 2019-07-10 DIAGNOSIS — J9611 Chronic respiratory failure with hypoxia: Secondary | ICD-10-CM | POA: Diagnosis not present

## 2019-07-10 DIAGNOSIS — U071 COVID-19: Secondary | ICD-10-CM | POA: Diagnosis not present

## 2019-07-10 DIAGNOSIS — R251 Tremor, unspecified: Secondary | ICD-10-CM | POA: Diagnosis not present

## 2019-07-10 DIAGNOSIS — I87333 Chronic venous hypertension (idiopathic) with ulcer and inflammation of bilateral lower extremity: Secondary | ICD-10-CM | POA: Diagnosis not present

## 2019-07-10 DIAGNOSIS — D8989 Other specified disorders involving the immune mechanism, not elsewhere classified: Secondary | ICD-10-CM | POA: Diagnosis not present

## 2019-07-10 DIAGNOSIS — E1151 Type 2 diabetes mellitus with diabetic peripheral angiopathy without gangrene: Secondary | ICD-10-CM | POA: Diagnosis not present

## 2019-07-13 ENCOUNTER — Other Ambulatory Visit: Payer: Self-pay | Admitting: Rheumatology

## 2019-07-13 DIAGNOSIS — M0609 Rheumatoid arthritis without rheumatoid factor, multiple sites: Secondary | ICD-10-CM

## 2019-07-16 ENCOUNTER — Other Ambulatory Visit: Payer: Self-pay | Admitting: Rheumatology

## 2019-07-16 DIAGNOSIS — M0609 Rheumatoid arthritis without rheumatoid factor, multiple sites: Secondary | ICD-10-CM

## 2019-08-01 DIAGNOSIS — Z0000 Encounter for general adult medical examination without abnormal findings: Secondary | ICD-10-CM | POA: Diagnosis not present

## 2019-08-06 DIAGNOSIS — J9621 Acute and chronic respiratory failure with hypoxia: Secondary | ICD-10-CM | POA: Diagnosis not present

## 2019-08-06 DIAGNOSIS — R0602 Shortness of breath: Secondary | ICD-10-CM | POA: Diagnosis not present

## 2019-08-06 DIAGNOSIS — J1289 Other viral pneumonia: Secondary | ICD-10-CM | POA: Diagnosis not present

## 2019-08-06 DIAGNOSIS — U071 COVID-19: Secondary | ICD-10-CM | POA: Diagnosis not present

## 2019-08-12 DIAGNOSIS — Z0100 Encounter for examination of eyes and vision without abnormal findings: Secondary | ICD-10-CM | POA: Diagnosis not present

## 2019-08-19 ENCOUNTER — Encounter: Payer: Self-pay | Admitting: Specialist

## 2019-08-19 ENCOUNTER — Ambulatory Visit (INDEPENDENT_AMBULATORY_CARE_PROVIDER_SITE_OTHER): Payer: Medicare HMO

## 2019-08-19 ENCOUNTER — Other Ambulatory Visit: Payer: Self-pay

## 2019-08-19 ENCOUNTER — Ambulatory Visit: Payer: Medicare HMO | Admitting: Specialist

## 2019-08-19 VITALS — BP 125/78 | HR 71 | Ht 64.0 in | Wt 234.0 lb

## 2019-08-19 DIAGNOSIS — M5136 Other intervertebral disc degeneration, lumbar region: Secondary | ICD-10-CM | POA: Diagnosis not present

## 2019-08-19 DIAGNOSIS — M5030 Other cervical disc degeneration, unspecified cervical region: Secondary | ICD-10-CM

## 2019-08-19 DIAGNOSIS — M48062 Spinal stenosis, lumbar region with neurogenic claudication: Secondary | ICD-10-CM

## 2019-08-19 DIAGNOSIS — M47816 Spondylosis without myelopathy or radiculopathy, lumbar region: Secondary | ICD-10-CM | POA: Diagnosis not present

## 2019-08-19 DIAGNOSIS — M542 Cervicalgia: Secondary | ICD-10-CM

## 2019-08-19 DIAGNOSIS — M961 Postlaminectomy syndrome, not elsewhere classified: Secondary | ICD-10-CM

## 2019-08-19 DIAGNOSIS — M51369 Other intervertebral disc degeneration, lumbar region without mention of lumbar back pain or lower extremity pain: Secondary | ICD-10-CM

## 2019-08-19 DIAGNOSIS — M545 Low back pain, unspecified: Secondary | ICD-10-CM

## 2019-08-19 NOTE — Patient Instructions (Signed)
Plan: Avoid overhead lifting and overhead use of the arms. Do not lift greater than 5 lbs. Adjust head rest in vehicle to prevent hyperextension if rear ended. Take extra precautions to avoid falling, including use of a cane if you feel weak. Avoid bending, stooping and avoid lifting weights greater than 10 lbs. Avoid prolong standing and walking. Avoid frequent bending and stooping  No lifting greater than 10 lbs. May use ice or moist heat for pain. Weight loss is of benefit. Handicap license is approved.  Hemp CBD capsules, amazon.com 5,000-7,000 mg per bottle, 60 capsules per bottle, take one capsule twice a day.

## 2019-08-19 NOTE — Progress Notes (Signed)
Office Visit Note   Patient: [name]           Date of Birth: May 13, 1946           MRN: [id number] Visit Date: 08/19/2019              Requested by: Crist Infante, MD [address] [place],  [place] [address] PCP: Crist Infante, MD   Assessment & Plan: Visit Diagnoses:  1. Degenerative disc disease, lumbar   2. Cervicalgia   3. Low back pain, unspecified back pain laterality, unspecified chronicity, unspecified whether sciatica present   4. Spinal stenosis of lumbar region with neurogenic claudication   5. Spondylosis without myelopathy or radiculopathy, lumbar region   6. Post-laminectomy syndrome   7. Degenerative disc disease, cervical     Plan: Avoid overhead lifting and overhead use of the arms. Do not lift greater than 5 lbs. Adjust head rest in vehicle to prevent hyperextension if rear ended. Take extra precautions to avoid falling, including use of a cane if you feel weak. Avoid bending, stooping and avoid lifting weights greater than 10 lbs. Avoid prolong standing and walking. Avoid frequent bending and stooping  No lifting greater than 10 lbs. May use ice or moist heat for pain. Weight loss is of benefit. Handicap license is approved.  Hemp CBD capsules, amazon.com 5,000-7,000 mg per bottle, 60 capsules per bottle, take one capsule twice a day.  Follow-Up Instructions: No follow-ups on file. ,    Follow-Up Instructions: Return in about 4 weeks (around 09/16/2019).   Orders:  Orders Placed This Encounter  Procedures  . XR Cervical Spine 2 or 3 views  . XR Lumbar Spine 2-3 Views   No orders of the defined types were placed in this encounter.     Procedures: No procedures performed   Clinical Data: No additional findings.   Subjective: No chief complaint on file.   74 year old female with past history of lumbar laminectomies for lumbar spinal stenosis and lateral recess stenosis almost 4 years ago. She had persistent pain despite the  decompression and reports her pain is worsening and she is unable to walk any Distance. She has trouble walking much due to SOB, had hospitalization for COVID-19 this past summer and nearly required  Venilator. She is now using an oxygen concentrator for breathing in room. No bowel or bladder difficulties. She has difficulty reaching the bathroom, getting up at night and getting to the bathroom is difficult sometimes.    Review of Systems  Constitutional: Positive for unexpected weight change. Negative for activity change, appetite change, chills, diaphoresis, fatigue and fever.  HENT: Negative.  Negative for congestion, dental problem, drooling, ear discharge, ear pain, facial swelling, hearing loss, mouth sores, nosebleeds, postnasal drip, rhinorrhea, sinus pressure, sinus pain, sneezing, sore throat, tinnitus, trouble swallowing and voice change.   Eyes: Positive for visual disturbance. Negative for photophobia, pain, discharge, redness and itching.  Respiratory: Negative.  Negative for apnea, cough, choking, chest tightness, shortness of breath, wheezing and stridor.   Cardiovascular: Positive for chest pain. Negative for palpitations and leg swelling.  Gastrointestinal: Negative.  Negative for abdominal distention, abdominal pain, anal bleeding, blood in stool, constipation, diarrhea, nausea, rectal pain and vomiting.  Endocrine: Negative.  Negative for cold intolerance, heat intolerance, polydipsia, polyphagia and polyuria.  Genitourinary: Positive for enuresis, frequency and urgency. Negative for decreased urine volume, difficulty urinating, dyspareunia, dysuria, flank pain, genital sores, hematuria, menstrual problem, pelvic pain, vaginal bleeding, vaginal discharge and  vaginal pain.  Musculoskeletal: Positive for arthralgias, back pain, gait problem, neck pain and neck stiffness. Negative for joint swelling and myalgias.  Skin: Negative.   Allergic/Immunologic: Negative.  Negative for  environmental allergies, food allergies and immunocompromised state.  Neurological: Positive for weakness and numbness. Negative for dizziness, tremors, seizures, syncope, facial asymmetry, speech difficulty, light-headedness and headaches.  Hematological: Negative.   Psychiatric/Behavioral: Negative.  Negative for agitation, behavioral problems, confusion, decreased concentration, dysphoric mood, hallucinations, self-injury, sleep disturbance and suicidal ideas. The patient is not nervous/anxious and is not hyperactive.      Objective: Vital Signs: BP 125/78 (BP Location: Left Arm, Patient Position: Sitting)   Pulse 71   Ht 5\' 4"  (1.626 m)   Wt 234 lb (106.1 kg)   BMI 40.17 kg/m   Physical Exam  Ortho Exam  Specialty Comments:  No specialty comments available.  Imaging: XR Cervical Spine 2 or 3 views  Result Date: 08/19/2019 AP and lateral flexion and extension radiographs of the cervical spine demonstrates kyphosis in the mid cervical segment C4-5 with anterolisthesis of 2 mm. There is degenerative disc changes C5-6 with large coalescing anterior spurs and narrowing of the disc, this appears to be undergoing autofusion. The area available for the spinal cord appears generous on the lateral radiograph of the cervical spine. The degree of anterolisthesis at C4-5 remains unchanged with flexion and extension.   XR Lumbar Spine 2-3 Views  Result Date: 08/19/2019 AP and lateral flexion and extension radiographs of the lumbar spine with multilevel degenerative disc disease with collapse of nearly every disc in the lumbar spine. There is decrease in the normal lordosis due to loss of height of the anterior column of the lumbar spine. Mild left lumbar curvature that is less than 10 degrees. Anterolisthesis of 1-24mm at the L3-4 level. Straightening of the normal lordotic curve in the upper 3 lumbar segments. Findings consistent with multilevel spondylosis and degenerative disc disease.      PMFS History: Patient Active Problem List   Diagnosis Date Noted  . Spinal stenosis, lumbar region, with neurogenic claudication 09/08/2015    Priority: High    Class: Chronic  . Acute on chronic respiratory failure with hypoxia ([place]) 02/28/2019  . OSA on CPAP 02/28/2019  . Diabetes mellitus type 2, uncontrolled, with complications ([place]) [id number]  . Breast cancer ([place]) 02/27/2019  . History of cancer chemotherapy 02/27/2019  . History of radiation therapy 02/27/2019  . Diabetic neuropathy ([place]) 02/27/2019  . HLD (hyperlipidemia) 02/27/2019  . Essential hypertension 02/27/2019  . QT prolongation 02/27/2019  . Pneumonia due to COVID-19 virus 02/24/2019  . Diarrhea   . Benign neoplasm of cecum   . Abdominal pain, epigastric   . Gastroesophageal reflux disease   . Dependence on nocturnal oxygen therapy 07/10/2017  . Open wound of left lower leg 12/26/2016  . Vitamin D deficiency 09/14/2016  . History of total knee replacement, bilateral 09/14/2016  . DJD (degenerative joint disease), cervical 09/14/2016  . 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 High risk medication use 05/20/2016  . OSA (obstructive sleep apnea) 10/17/2013  . Dyspnea 09/04/2013  . Atypical chest pain 09/04/2013  . Morbid obesity ([place]) 03/21/2012  . Cancer of lower-outer quadrant of female breast ([place]) 03/15/2012  . Diabetes mellitus, type II ([place]) 02/22/2011  . Coronary artery disease   . Hypertension   . Hyperlipidemia   . OA (osteoarthritis) of knee    Past Medical History:  Diagnosis Date  . Actinic  keratosis   . Asthma    Albuterol inhaler as needed.Pulmicort neb as needed  . Asthma   . Breast cancer ([place])    right - lumpectomy   . Chronic back pain    spinal stenosis  . Coronary artery disease   . Dependence on nocturnal oxygen therapy 07/10/2017   Pt uses 2 liters 02 at night   . Depression    takes CYmbalta and Wellbutrin daily  . Diabetes  mellitus    not on any meds/controlled by diet  . Dyspnea    with exertion occasionally when lies down  . GERD (gastroesophageal reflux disease)    takes Omeprazole daily  . Headache    oocasionally  . History of kidney stones   . Hyperlipidemia    not on any meds  . Hypertension    takes Benazepril,Bystolic,and Amlodipine daily  . IBS (irritable bowel syndrome)   . Joint pain   . Joint swelling   . Livedoid vasculitis   . Nocturia   . OA (osteoarthritis) of knee   . OSA (obstructive sleep apnea)   . Other seborrheic keratosis   . Oxygen deficiency    2 liters at night per pt for OSA- no cpap   . Peripheral edema    takes daily as needed  . Peripheral neuropathy   . Personal history of chemotherapy   . Personal history of radiation therapy   . Pneumonia    hx of-several yrs ago  . RA (rheumatoid arthritis) ([place])   . Sleep apnea    pt states she uses oxygen at night - no cpap- uses 02 2 liters at night   . Urinary frequency   . Urinary urgency   . Weakness    numbness and tingling mainly in left leg occasionally in right.Tingling/numbness in hands    Family History  Problem Relation Age of Onset  . Heart disease Mother   . Cancer Father   . Cancer Brother   . Heart disease Brother     Past Surgical History:  Procedure Laterality Date  . BREAST LUMPECTOMY  1997   RIGHT BREAST  . CARDIAC CATHETERIZATION  04/23/2004   EF 60%  . cataract surgery Bilateral   . COLONOSCOPY    . COLONOSCOPY WITH PROPOFOL N/A 08/07/2017   Procedure: COLONOSCOPY WITH PROPOFOL;  Surgeon: Ladene Artist, MD;  [facility];  Service: Endoscopy;  Laterality: N/A;  . CORONARY ARTERY BYPASS GRAFT  2005   LIMA GRAFT TO LAD, SAPHENOUS VEIN GRAFT TO THE FIRST DIAGONAL, AND LEFT RADIAL ARTERY GRAFT TO THE OM  . ESOPHAGOGASTRODUODENOSCOPY (EGD) WITH PROPOFOL N/A 08/07/2017   Procedure: ESOPHAGOGASTRODUODENOSCOPY (EGD) WITH PROPOFOL;  Surgeon: Ladene Artist, MD;  [facility];  Service: Endoscopy;  Laterality: N/A;  . FOOT SURGERY Right   . JOINT REPLACEMENT    . LITHOTRIPSY    . LUMBAR LAMINECTOMY/DECOMPRESSION MICRODISCECTOMY N/A 09/08/2015   Procedure: CENTRAL DECOMPRESSIVE LUMBAR LAMINECTOMIES L3-4, L4-5 AND BILATERAL HEMILAMINECTOMY L5-S1;  Surgeon: Jessy Oto, MD;  Location: [facility];  Service: Orthopedics;  Laterality: N/A;  . nodule removed from left elbow    . SHOULDER ARTHROSCOPY    . TOTAL KNEE ARTHROPLASTY Bilateral   . TOTAL SHOULDER ARTHROPLASTY    . TRIPLE BYPASS  04/27/05  . US ECHOCARDIOGRAPHY  01/06/2009   EF 55-60%  . WRIST SURGERY Left    Social History   Occupational History  . Occupation: retired    Fish farm manager: UNEMPLOYED  Tobacco Use  .  Smoking status: Never Smoker  . Smokeless tobacco: Never Used  Substance and Sexual Activity  . Alcohol use: No    Alcohol/week: 0.0 standard drinks  . Drug use: Never  . Sexual activity: Not on file

## 2019-09-01 ENCOUNTER — Ambulatory Visit: Payer: Medicare HMO | Attending: Internal Medicine

## 2019-09-01 DIAGNOSIS — Z23 Encounter for immunization: Secondary | ICD-10-CM | POA: Insufficient documentation

## 2019-09-01 NOTE — Progress Notes (Signed)
   Covid-19 Vaccination Clinic  Name:  [name]    MRN: [id number] DOB: 1946-01-10  09/01/2019  [name] was observed post Covid-19 immunization for 15 minutes without incidence. She was provided with Vaccine Information Sheet and instruction to access the V-Safe system.   [name] was instructed to call 911 with any severe reactions post vaccine: Marland Kitchen Difficulty breathing  . Swelling of your face and throat  . A fast heartbeat  . A bad rash all over your body  . Dizziness and weakness    Immunizations Administered    Name Date Dose VIS Date Route   Pfizer COVID-19 Vaccine 09/01/2019 10:09 AM 0.3 mL 07/05/2019 Intramuscular   Manufacturer: [place]   Lot: [id number]   [place]: [id number]

## 2019-09-04 ENCOUNTER — Other Ambulatory Visit: Payer: Self-pay | Admitting: Internal Medicine

## 2019-09-04 DIAGNOSIS — Z1231 Encounter for screening mammogram for malignant neoplasm of breast: Secondary | ICD-10-CM

## 2019-09-06 DIAGNOSIS — U071 COVID-19: Secondary | ICD-10-CM | POA: Diagnosis not present

## 2019-09-06 DIAGNOSIS — J1289 Other viral pneumonia: Secondary | ICD-10-CM | POA: Diagnosis not present

## 2019-09-06 DIAGNOSIS — J9621 Acute and chronic respiratory failure with hypoxia: Secondary | ICD-10-CM | POA: Diagnosis not present

## 2019-09-06 DIAGNOSIS — R0602 Shortness of breath: Secondary | ICD-10-CM | POA: Diagnosis not present

## 2019-09-07 ENCOUNTER — Other Ambulatory Visit: Payer: Self-pay | Admitting: Physician Assistant

## 2019-09-07 DIAGNOSIS — M0609 Rheumatoid arthritis without rheumatoid factor, multiple sites: Secondary | ICD-10-CM

## 2019-09-09 ENCOUNTER — Other Ambulatory Visit: Payer: Self-pay

## 2019-09-09 DIAGNOSIS — Z79899 Other long term (current) drug therapy: Secondary | ICD-10-CM | POA: Diagnosis not present

## 2019-09-09 NOTE — Telephone Encounter (Addendum)
Last Visit: 06/05/2019 Next Visit: 11/06/2019 Labs: 03/07/2019 Eye exam: 05/30/2019  Patient updated labs today in the office.   Okay to refill per Dr. Estanislado Pandy.

## 2019-09-10 LAB — CBC WITH DIFFERENTIAL/PLATELET
Absolute Monocytes: 634 cells/uL (ref 200–950)
Basophils Absolute: 7 cells/uL (ref 0–200)
Basophils Relative: 0.1 %
Eosinophils Absolute: 72 cells/uL (ref 15–500)
Eosinophils Relative: 1 %
HCT: 40.6 % (ref 35.0–45.0)
Hemoglobin: 13.5 g/dL (ref 11.7–15.5)
Lymphs Abs: 3348 cells/uL (ref 850–3900)
MCH: 29.9 pg (ref 27.0–33.0)
MCHC: 33.3 g/dL (ref 32.0–36.0)
MCV: 90 fL (ref 80.0–100.0)
MPV: 11.5 fL (ref 7.5–12.5)
Monocytes Relative: 8.8 %
Neutro Abs: 3139 cells/uL (ref 1500–7800)
Neutrophils Relative %: 43.6 %
Platelets: 163 10*3/uL (ref 140–400)
RBC: 4.51 10*6/uL (ref 3.80–5.10)
RDW: 13.6 % (ref 11.0–15.0)
Total Lymphocyte: 46.5 %
WBC: 7.2 10*3/uL (ref 3.8–10.8)

## 2019-09-10 LAB — COMPLETE METABOLIC PANEL WITH GFR
AG Ratio: 1.9 (calc) (ref 1.0–2.5)
ALT: 16 U/L (ref 6–29)
AST: 21 U/L (ref 10–35)
Albumin: 4.4 g/dL (ref 3.6–5.1)
Alkaline phosphatase (APISO): 38 U/L (ref 37–153)
BUN: 16 mg/dL (ref 7–25)
CO2: 24 mmol/L (ref 20–32)
Calcium: 9.6 mg/dL (ref 8.6–10.4)
Chloride: 104 mmol/L (ref 98–110)
Creat: 0.64 mg/dL (ref 0.60–0.93)
GFR, Est African American: 102 mL/min/{1.73_m2} (ref >=60)
GFR, Est Non African American: 88 mL/min/{1.73_m2} (ref >=60)
Globulin: 2.3 g/dL (calc) (ref 1.9–3.7)
Glucose, Bld: 110 mg/dL — ABNORMAL HIGH (ref 65–99)
Potassium: 4.7 mmol/L (ref 3.5–5.3)
Sodium: 141 mmol/L (ref 135–146)
Total Bilirubin: 0.4 mg/dL (ref 0.2–1.2)
Total Protein: 6.7 g/dL (ref 6.1–8.1)

## 2019-09-10 NOTE — Progress Notes (Signed)
CBC is normal.  CMP shows mild elevation of glucose probably not fasting.

## 2019-09-25 ENCOUNTER — Ambulatory Visit: Payer: Medicare HMO | Attending: Internal Medicine

## 2019-09-25 DIAGNOSIS — Z23 Encounter for immunization: Secondary | ICD-10-CM

## 2019-09-25 NOTE — Progress Notes (Signed)
   Covid-19 Vaccination Clinic  Name:  [name]    MRN: [id number] DOB: Feb 25, 1946  09/25/2019  [name] was observed post Covid-19 immunization for 15 minutes without incident. She was provided with Vaccine Information Sheet and instruction to access the V-Safe system.   [name] was instructed to call 911 with any severe reactions post vaccine: Marland Kitchen Difficulty breathing  . Swelling of face and throat  . A fast heartbeat  . A bad rash all over body  . Dizziness and weakness   Immunizations Administered    Name Date Dose VIS Date Route   Pfizer COVID-19 Vaccine 09/25/2019 11:55 AM 0.3 mL 07/05/2019 Intramuscular   Manufacturer: [place]   Lot: [id number]   [place]: [id number]

## 2019-09-27 NOTE — Progress Notes (Signed)
[name] Date of Birth: [birth year]   History of Present Illness: [name] is seen for follow up CAD.  Last seen in February 2019. She is s/p CABG in 2005. Myoview in March 2018 was normal.  Echo in 2015 unremarkable. She was seen by Dr. Elsworth Soho and diagnosed with OSA by at home testing. She is currently using nocturnal oxygen. In 2018 she underwent lumbar laminectomy complicated by a dural tear that was repaired. Since that time she has continued to have back pain that limits her activity. When seen in 2019 complained of dyspnea. Echo showed moderate LVH but was otherwise normal. She had followup with pulmonary. PFTs showed moderate restriction.   She was admitted in August 2020 with Covid 19 PNA. Managed with oxygen therapy and steroids, Remdesivir, and Actemsa. She reports since she had Covid she is still on oxygen. Has to use a walker to walk very far. Memory is poor. Hearing is worse. She feels shaky. No chest pain but has occasional sensation in throat. Just takes ASA for this.    Current Outpatient Medications on File Prior to Visit  Medication Sig Dispense Refill  . acetaminophen (TYLENOL) 500 MG tablet Take 1,000 mg by mouth 2 (two) times daily as needed for moderate pain.    Marland Kitchen albuterol (VENTOLIN HFA) 108 (90 Base) MCG/ACT inhaler Inhale 2 puffs into the lungs every 4 (four) hours as needed for wheezing or shortness of breath (cough, shortness of breath or wheezing.). 6.7 g 0  . amLODipine (NORVASC) 5 MG tablet amlodipine 5 mg tablet    . aspirin 81 MG tablet Take 81 mg by mouth at bedtime.     . baclofen (LIORESAL) 10 MG tablet TAKE 1 TABLET BY MOUTH TWICE A DAY AS NEEDED FOR SPASMS (Patient taking differently: Take 10 mg by mouth 2 (two) times daily. ) 180 tablet 0  . betamethasone dipropionate (DIPROLENE) 0.05 % ointment Apply 1 application topically daily.     . blood glucose meter kit and supplies Dispense based on patient and insurance preference. Use up to four times daily  as directed. (FOR ICD-10 E10.9, E11.9). 1 each 0  . budesonide (PULMICORT) 0.5 MG/2ML nebulizer solution Take 0.5 mg by nebulization 2 (two) times a day.     Marland Kitchen buPROPion (WELLBUTRIN XL) 150 MG 24 hr tablet Take 150 mg by mouth daily.     . busPIRone (BUSPAR) 7.5 MG tablet Take 7.5 mg by mouth 2 (two) times daily.     . Calcium Carb-Cholecalciferol (CALCIUM 1000 + D PO) Take by mouth daily.    . cetirizine (ZYRTEC) 10 MG tablet Take 10 mg by mouth daily.    . cholecalciferol (VITAMIN D3) 25 MCG (1000 UT) tablet Take 1,000 Units by mouth daily.    . clobetasol cream (TEMOVATE) 0.05 % clobetasol 0.05 % topical cream  APPLY TO AFFECTED AREA UP TO TWICE DAILY FOR ECZEMA OR ITCHING    . clotrimazole (LOTRIMIN) 1 % cream Apply 1 application topically as needed.    . Cyanocobalamin (VITAMIN B-12 PO) Take by mouth daily.    Marland Kitchen dexamethasone (DECADRON) 6 MG tablet dexamethasone 6 mg tablet  TAKE 1 TABLET BY MOUTH EVERY DAY FOR COPD EXACERBATION    . DULoxetine (CYMBALTA) 60 MG capsule Take 60 mg by mouth daily.    . Echinacea 400 MG CAPS Take by mouth daily.    Marland Kitchen ezetimibe (ZETIA) 10 MG tablet Take 1 tablet (10 mg total) by mouth daily. 30 tablet 0  .  fluticasone (FLONASE) 50 MCG/ACT nasal spray Place 2 sprays into both nostrils 2 (two) times a day.    . gabapentin (NEURONTIN) 300 MG capsule Take 600 mg by mouth 3 (three) times daily.     Marland Kitchen galantamine (RAZADYNE ER) 16 MG 24 hr capsule Take 16 mg by mouth daily with breakfast.   11  . hydroxychloroquine (PLAQUENIL) 200 MG tablet TAKE 1 TABLET TWICE DAILY FOR RHEUMATOID ARTHRITIS 180 tablet 0  . meloxicam (MOBIC) 15 MG tablet Take 15 mg by mouth 2 (two) times a week.     . montelukast (SINGULAIR) 10 MG tablet Take 10 mg by mouth at bedtime.    . Multiple Vitamins-Minerals (HAIR SKIN NAILS PO) Take by mouth daily.    . Multiple Vitamins-Minerals (ZINC PO) Take 1 tablet by mouth daily.    . mupirocin ointment (BACTROBAN) 2 % as needed.    . nebivolol  (BYSTOLIC) 10 MG tablet Take 10 mg by mouth daily.      . nitroGLYCERIN (NITROSTAT) 0.4 MG SL tablet nitroglycerin 0.4 mg sublingual tablet    . olmesartan-hydrochlorothiazide (BENICAR HCT) 40-25 MG tablet Take 1 tablet by mouth daily.   5  . omeprazole (PRILOSEC) 20 MG capsule Take 20 mg by mouth daily.    . Potassium 99 MG TABS Take by mouth daily.    . SUPER B COMPLEX/C PO Take by mouth daily.    . folic acid (FOLVITE) 1 MG tablet Take 2 tablets (2 mg total) by mouth daily. 180 tablet 4   No current facility-administered medications on file prior to visit.    Allergies  Allergen Reactions  . Statins Other (See Comments)    Joint pain and swelling/burning  . Diazepam   . Fentanyl   . Morphine And Related Other (See Comments)    hallucinations  . Other     Past Medical History:  Diagnosis Date  . Actinic keratosis   . Asthma    Albuterol inhaler as needed.Pulmicort neb as needed  . Asthma   . Breast cancer ([place])    right - lumpectomy   . Chronic back pain    spinal stenosis  . Coronary artery disease   . Dependence on nocturnal oxygen therapy 07/10/2017   Pt uses 2 liters 02 at night   . Depression    takes CYmbalta and Wellbutrin daily  . Diabetes mellitus    not on any meds/controlled by diet  . Dyspnea    with exertion occasionally when lies down  . GERD (gastroesophageal reflux disease)    takes Omeprazole daily  . Headache    oocasionally  . History of kidney stones   . Hyperlipidemia    not on any meds  . Hypertension    takes Benazepril,Bystolic,and Amlodipine daily  . IBS (irritable bowel syndrome)   . Joint pain   . Joint swelling   . Livedoid vasculitis   . Nocturia   . OA (osteoarthritis) of knee   . OSA (obstructive sleep apnea)   . Other seborrheic keratosis   . Oxygen deficiency    2 liters at night per pt for OSA- no cpap   . Peripheral edema    takes daily as needed  . Peripheral neuropathy   . Personal history of chemotherapy   .  Personal history of radiation therapy   . Pneumonia    hx of-several yrs ago  . RA (rheumatoid arthritis) ([place])   . Sleep apnea    pt states she uses oxygen  at night - no cpap- uses 02 2 liters at night   . Urinary frequency   . Urinary urgency   . Weakness    numbness and tingling mainly in left leg occasionally in right.Tingling/numbness in hands    Past Surgical History:  Procedure Laterality Date  . BREAST LUMPECTOMY  1997   RIGHT BREAST  . CARDIAC CATHETERIZATION  04/23/2004   EF 60%  . cataract surgery Bilateral   . COLONOSCOPY    . COLONOSCOPY WITH PROPOFOL N/A 08/07/2017   Procedure: COLONOSCOPY WITH PROPOFOL;  Surgeon: Ladene Artist, MD;  Location: [facility];  Service: Endoscopy;  Laterality: N/A;  . CORONARY ARTERY BYPASS GRAFT  2005   LIMA GRAFT TO LAD, SAPHENOUS VEIN GRAFT TO THE FIRST DIAGONAL, AND LEFT RADIAL ARTERY GRAFT TO THE OM  . ESOPHAGOGASTRODUODENOSCOPY (EGD) WITH PROPOFOL N/A 08/07/2017   Procedure: ESOPHAGOGASTRODUODENOSCOPY (EGD) WITH PROPOFOL;  Surgeon: Ladene Artist, MD;  Location: [facility];  Service: Endoscopy;  Laterality: N/A;  . FOOT SURGERY Right   . JOINT REPLACEMENT    . LITHOTRIPSY    . LUMBAR LAMINECTOMY/DECOMPRESSION MICRODISCECTOMY N/A 09/08/2015   Procedure: CENTRAL DECOMPRESSIVE LUMBAR LAMINECTOMIES L3-4, L4-5 AND BILATERAL HEMILAMINECTOMY L5-S1;  Surgeon: Jessy Oto, MD;  Location: [facility];  Service: Orthopedics;  Laterality: N/A;  . nodule removed from left elbow    . SHOULDER ARTHROSCOPY    . TOTAL KNEE ARTHROPLASTY Bilateral   . TOTAL SHOULDER ARTHROPLASTY    . TRIPLE BYPASS  04/27/05  . US ECHOCARDIOGRAPHY  01/06/2009   EF 55-60%  . WRIST SURGERY Left     Social History   Tobacco Use  Smoking Status Never Smoker  Smokeless Tobacco Never Used    Social History   Substance and Sexual Activity  Alcohol Use No  . Alcohol/week: 0.0 standard drinks    Family History  Problem Relation Age of Onset  . Heart disease  Mother   . Cancer Father   . Cancer Brother   . Heart disease Brother     Review of Systems: As noted history of present illness.  All other systems were reviewed and are negative.  Physical Exam: BP 138/74   Pulse 71   Temp 98 F (36.7 C)   Resp (!) 21   Ht _0  (1.626 m)   Wt 236 lb (107 kg)   SpO2 98%   BMI 40.51 kg/m  GENERAL:  Well appearing obese WF uses a cane to walk. HEENT:  PERRL, EOMI, sclera are clear. Oropharynx is clear. NECK:  No jugular venous distention, carotid upstroke brisk and symmetric, no bruits, no thyromegaly or adenopathy LUNGS:  Clear to auscultation bilaterally CHEST:  Unremarkable HEART:  RRR,  PMI not displaced or sustained,S1 and S2 within normal limits, no S3, no S4: no clicks, no rubs, no murmurs ABD:  Soft, nontender. BS +, no masses or bruits. No hepatomegaly, no splenomegaly EXT:  2 + pulses throughout, no edema, no cyanosis no clubbing SKIN:  Warm and dry.  No rashes NEURO:  Alert and oriented x 3. Cranial nerves II through XII intact. PSYCH:  Cognitively intact    LABORATORY DATA:  Lab Results  Component Value Date   WBC 7.2 09/09/2019   HGB 13.5 09/09/2019   HCT 40.6 09/09/2019   PLT 163 09/09/2019   GLUCOSE 110 (H) 09/09/2019   CHOL 202 (H) 02/28/2019   TRIG 122 02/28/2019   HDL 36 (L) 02/28/2019   LDLCALC 142 (H) 02/28/2019   ALT  16 09/09/2019   AST 21 09/09/2019   NA 141 09/09/2019   K 4.7 09/09/2019   CL 104 09/09/2019   CREATININE 0.64 09/09/2019   BUN 16 09/09/2019   CO2 24 09/09/2019   TSH 2.93 07/28/2017   INR 1.07 04/08/2011   HGBA1C 8.1 (H) 02/26/2019   Labs dated 08/02/16: cholesterol 286, triglycerides 181, HDL 52, LDL 198, A1c 6.5%. Dated 08/25/17: cholesterol 257, triglycerides 121, HDL 59, LDL 174. A1c 7.6%. Normal chemistries, TSH, Hgb.  Dated 07/03/19: cholesterol 192, triglycerides 159, HDL 45, LDL 115. A1c 6.0%.    Echo: 2/16/15Study Conclusions  - Left ventricle: The cavity size was normal. Wall  thickness was normal. Systolic function was normal. The estimated ejection fraction was in the range of 50% to 55%. - Mitral valve: Nodular calcification of anterior leaflet tip Mild regurgitation. - Left atrium: The atrium was moderately dilated. - Atrial septum: No defect or patent foramen ovale was identified. - Pericardium, extracardiac: A trivial pericardial effusion was identified posterior to the heart. - Impressions: Elevated E/E' ratio suggested diastolic dysfunction with elevated EDP Impressions:  - Elevated E/E' ratio suggested diastolic dysfunction with elevated EDP   Myoview 09/29/16: Study Highlights     The left ventricular ejection fraction is mildly decreased (45-54%).  Nuclear stress EF: 51%.  There was no ST segment deviation noted during stress.  The study is normal.  This is a low risk study.   Normal stress nuclear study with no ischemia or infarction; EF 51 with normal wall motion; cannot R/O left breast mass; suggest mammogram if not performed recently.    Echo 09/20/17: Study Conclusions   - Left ventricle: Wall thickness was increased in a pattern of  moderate LVH. Systolic function was normal. The estimated  ejection fraction was in the range of 55% to 60%. Features are  consistent with a pseudonormal left ventricular filling pattern,  with concomitant abnormal relaxation and increased filling  pressure (grade 2 diastolic dysfunction).    Assessment / Plan: 1. Coronary disease.  She is status post CABG in 2005. Nuclear stress test in March 2018 was normal. No significant angina. Continue Bystolic and amlodipine. On ASA.   2. Hypertension, well controlled. Continue current medication  3. Hyperlipidemia.  Statin intolerant. Now only on Zetia. LDL improved from 174>> 115  4. Diabetes mellitus type 2.  5. Morbid obesity with OSA  6. S/p Covid 19 infection. Persistent oxygen requirement.

## 2019-09-30 ENCOUNTER — Other Ambulatory Visit: Payer: Self-pay

## 2019-09-30 ENCOUNTER — Encounter: Payer: Self-pay | Admitting: Cardiology

## 2019-09-30 ENCOUNTER — Ambulatory Visit: Payer: Medicare HMO | Admitting: Specialist

## 2019-09-30 ENCOUNTER — Ambulatory Visit: Payer: Medicare HMO | Admitting: Cardiology

## 2019-09-30 VITALS — BP 138/74 | HR 71 | Temp 98.0°F | Resp 21 | Ht 64.0 in | Wt 236.0 lb

## 2019-09-30 DIAGNOSIS — E7800 Pure hypercholesterolemia, unspecified: Secondary | ICD-10-CM | POA: Diagnosis not present

## 2019-09-30 DIAGNOSIS — I25708 Atherosclerosis of coronary artery bypass graft(s), unspecified, with other forms of angina pectoris: Secondary | ICD-10-CM

## 2019-09-30 DIAGNOSIS — I10 Essential (primary) hypertension: Secondary | ICD-10-CM | POA: Diagnosis not present

## 2019-10-02 ENCOUNTER — Other Ambulatory Visit: Payer: Self-pay

## 2019-10-02 ENCOUNTER — Encounter: Payer: Self-pay | Admitting: Specialist

## 2019-10-02 ENCOUNTER — Ambulatory Visit: Payer: Medicare HMO | Admitting: Specialist

## 2019-10-02 VITALS — BP 137/78 | HR 69 | Ht 64.0 in | Wt 234.0 lb

## 2019-10-02 DIAGNOSIS — M47894 Other spondylosis, thoracic region: Secondary | ICD-10-CM

## 2019-10-02 DIAGNOSIS — M47812 Spondylosis without myelopathy or radiculopathy, cervical region: Secondary | ICD-10-CM

## 2019-10-02 DIAGNOSIS — M47816 Spondylosis without myelopathy or radiculopathy, lumbar region: Secondary | ICD-10-CM

## 2019-10-02 NOTE — Patient Instructions (Signed)
Avoid overhead lifting and overhead use of the arms. Do not lift greater than 5 lbs. Adjust head rest in vehicle to prevent hyperextension if rear ended. Take extra precautions to avoid falling, I think it is okay to increase the Hemp CBD to up to 4 times per day. Hot showers and consider therapy to strengthen the cervical spine muscles.

## 2019-10-03 ENCOUNTER — Ambulatory Visit
Admission: RE | Admit: 2019-10-03 | Discharge: 2019-10-03 | Disposition: A | Discharge status: Home / Self Care | Payer: Medicare HMO | Source: Ambulatory Visit | Attending: Internal Medicine | Admitting: Internal Medicine

## 2019-10-03 DIAGNOSIS — Z1231 Encounter for screening mammogram for malignant neoplasm of breast: Secondary | ICD-10-CM

## 2019-10-04 DIAGNOSIS — U071 COVID-19: Secondary | ICD-10-CM | POA: Diagnosis not present

## 2019-10-04 DIAGNOSIS — J9621 Acute and chronic respiratory failure with hypoxia: Secondary | ICD-10-CM | POA: Diagnosis not present

## 2019-10-04 DIAGNOSIS — J1289 Other viral pneumonia: Secondary | ICD-10-CM | POA: Diagnosis not present

## 2019-10-04 DIAGNOSIS — R0602 Shortness of breath: Secondary | ICD-10-CM | POA: Diagnosis not present

## 2019-10-30 NOTE — Progress Notes (Signed)
Office Visit Note  Patient: [name]             Date of Birth: 31-May-1946           MRN: [id number]             PCP: Crist Infante, MD Referring: Crist Infante, MD Visit Date: 11/06/2019 Occupation: @GUAROCC @  Subjective:  Pain in both hands   History of Present Illness: [name] is a 74 y.o. female seronegative rheumatoid arthritis and DDD.  She is taking Plaquenil 200 mg 1 tablet by mouth twice daily.  She has intermittent pain and swelling in both hands.  She has chronic pain in both shoulder joints.  She has persistent neck and lower back pain, and she follows up with Dr. Louanne Skye on a regular basis.  She has started using hemp oil as recommended by Dr. Louanne Skye which seems to be alleviating some of her discomfort. She states both knee replacements are doing well. She has pedal edema bilaterally, worse in the left LE. She has had a non-healing ulcer on the anterior aspect of left shin.  She continues to follow up with her dermatologist.    Activities of Daily Living:  Patient reports morning stiffness for 2 hours.   Patient Reports nocturnal pain.  Difficulty dressing/grooming: Reports Difficulty climbing stairs: Reports Difficulty getting out of chair: Reports Difficulty using hands for taps, buttons, cutlery, and/or writing: Reports  Review of Systems  Constitutional: Positive for fatigue.  HENT: Positive for mouth dryness. Negative for mouth sores and nose dryness.   Eyes: Positive for dryness. Negative for pain and visual disturbance.  Respiratory: Positive for shortness of breath. Negative for cough, hemoptysis and difficulty breathing.   Cardiovascular: Positive for swelling in legs/feet. Negative for chest pain, palpitations and hypertension.  Gastrointestinal: Positive for diarrhea. Negative for blood in stool and constipation.  Endocrine: Negative for excessive thirst and increased urination.  Genitourinary: Negative for difficulty urinating and painful urination.   Musculoskeletal: Positive for arthralgias, joint pain, joint swelling, morning stiffness and muscle tenderness. Negative for myalgias, muscle weakness and myalgias.  Skin: Negative for color change, pallor, rash, hair loss, nodules/bumps, skin tightness, ulcers and sensitivity to sunlight.  Allergic/Immunologic: Negative for susceptible to infections.  Neurological: Negative for dizziness, tremors, headaches and weakness.  Hematological: Negative for bruising/bleeding tendency and swollen glands.  Psychiatric/Behavioral: Positive for sleep disturbance. Negative for depressed mood. The patient is not nervous/anxious.     PMFS History:  Patient Active Problem List   Diagnosis Date Noted  . Acute on chronic respiratory failure with hypoxia ([place]) 02/28/2019  . OSA on CPAP 02/28/2019  . Diabetes mellitus type 2, uncontrolled, with complications ([place]) [id number]  . Breast cancer ([place]) 02/27/2019  . History of cancer chemotherapy 02/27/2019  . History of radiation therapy 02/27/2019  . Diabetic neuropathy ([place]) 02/27/2019  . HLD (hyperlipidemia) 02/27/2019  . Essential hypertension 02/27/2019  . QT prolongation 02/27/2019  . Pneumonia due to COVID-19 virus 02/24/2019  . Diarrhea   . Benign neoplasm of cecum   . Abdominal pain, epigastric   . Gastroesophageal reflux disease   . Dependence on nocturnal oxygen therapy 07/10/2017  . Open wound of left lower leg 12/26/2016  . Vitamin D deficiency 09/14/2016  . History of total knee replacement, bilateral 09/14/2016  . DJD (degenerative joint disease), cervical 09/14/2016  . 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 High risk medication use 05/20/2016  . Spinal stenosis, lumbar region, with  neurogenic claudication 09/08/2015    Class: Chronic  . OSA (obstructive sleep apnea) 10/17/2013  . Dyspnea 09/04/2013  . Atypical chest pain 09/04/2013  . Morbid obesity ([place]) 03/21/2012  . Cancer of lower-outer  quadrant of female breast ([place]) 03/15/2012  . Diabetes mellitus, type II ([place]) 02/22/2011  . Coronary artery disease   . Hypertension   . Hyperlipidemia   . OA (osteoarthritis) of knee     Past Medical History:  Diagnosis Date  . Actinic keratosis   . Asthma    Albuterol inhaler as needed.Pulmicort neb as needed  . Asthma   . Breast cancer ([place])    right - lumpectomy   . Chronic back pain    spinal stenosis  . Coronary artery disease   . Dependence on nocturnal oxygen therapy 07/10/2017   Pt uses 2 liters 02 at night   . Depression    takes CYmbalta and Wellbutrin daily  . Diabetes mellitus    not on any meds/controlled by diet  . Dyspnea    with exertion occasionally when lies down  . GERD (gastroesophageal reflux disease)    takes Omeprazole daily  . Headache    oocasionally  . History of kidney stones   . Hyperlipidemia    not on any meds  . Hypertension    takes Benazepril,Bystolic,and Amlodipine daily  . IBS (irritable bowel syndrome)   . Joint pain   . Joint swelling   . Livedoid vasculitis   . Nocturia   . OA (osteoarthritis) of knee   . OSA (obstructive sleep apnea)   . Other seborrheic keratosis   . Oxygen deficiency    2 liters at night per pt for OSA- no cpap   . Peripheral edema    takes daily as needed  . Peripheral neuropathy   . Personal history of chemotherapy   . Personal history of radiation therapy   . Pneumonia    hx of-several yrs ago  . RA (rheumatoid arthritis) ([place])   . Sleep apnea    pt states she uses oxygen at night - no cpap- uses 02 2 liters at night   . Urinary frequency   . Urinary urgency   . Weakness    numbness and tingling mainly in left leg occasionally in right.Tingling/numbness in hands    Family History  Problem Relation Age of Onset  . Heart disease Mother   . Cancer Father   . Cancer Brother   . Heart disease Brother    Past Surgical History:  Procedure Laterality Date  . BREAST LUMPECTOMY  1997   RIGHT  BREAST  . CARDIAC CATHETERIZATION  04/23/2004   EF 60%  . cataract surgery Bilateral   . COLONOSCOPY    . COLONOSCOPY WITH PROPOFOL N/A 08/07/2017   Procedure: COLONOSCOPY WITH PROPOFOL;  Surgeon: Ladene Artist, MD;  Location: [facility];  Service: Endoscopy;  Laterality: N/A;  . CORONARY ARTERY BYPASS GRAFT  2005   LIMA GRAFT TO LAD, SAPHENOUS VEIN GRAFT TO THE FIRST DIAGONAL, AND LEFT RADIAL ARTERY GRAFT TO THE OM  . ESOPHAGOGASTRODUODENOSCOPY (EGD) WITH PROPOFOL N/A 08/07/2017   Procedure: ESOPHAGOGASTRODUODENOSCOPY (EGD) WITH PROPOFOL;  Surgeon: Ladene Artist, MD;  Location: [facility];  Service: Endoscopy;  Laterality: N/A;  . FOOT SURGERY Right   . JOINT REPLACEMENT    . LITHOTRIPSY    . LUMBAR LAMINECTOMY/DECOMPRESSION MICRODISCECTOMY N/A 09/08/2015   Procedure: CENTRAL DECOMPRESSIVE LUMBAR LAMINECTOMIES L3-4, L4-5 AND BILATERAL HEMILAMINECTOMY L5-S1;  Surgeon: Jessy Oto,  MD;  Location: [facility];  Service: Orthopedics;  Laterality: N/A;  . nodule removed from left elbow    . SHOULDER ARTHROSCOPY    . TOTAL KNEE ARTHROPLASTY Bilateral   . TOTAL SHOULDER ARTHROPLASTY    . TRIPLE BYPASS  04/27/05  . US ECHOCARDIOGRAPHY  01/06/2009   EF 55-60%  . WRIST SURGERY Left    Social History   Social History Narrative  . Not on file   Immunization History  Administered Date(s) Administered  . Influenza Split 04/24/2013  . PFIZER SARS-COV-2 Vaccination 09/01/2019, 09/25/2019  . Zoster Recombinat (Shingrix) 10/11/2017, 12/22/2017     Objective: Vital Signs: BP 140/78 (BP Location: Left Arm, Patient Position: Sitting, Cuff Size: Normal)   Pulse 71   Resp 20   Ht 5\' 4"  (1.626 m)   Wt 239 lb 12.8 oz (108.8 kg)   BMI 41.16 kg/m    Physical Exam Vitals and nursing note reviewed.  Constitutional:      Appearance: She is well-developed.  HENT:     Head: Normocephalic and atraumatic.  Eyes:     Conjunctiva/sclera: Conjunctivae normal.  Pulmonary:     Effort: Pulmonary  effort is normal.  Abdominal:     General: Bowel sounds are normal.     Palpations: Abdomen is soft.  Musculoskeletal:     Cervical back: Normal range of motion.  Lymphadenopathy:     Cervical: No cervical adenopathy.  Skin:    General: Skin is warm and dry.     Capillary Refill: Capillary refill takes less than 2 seconds.     Comments: Non-healing ulcer with surrounding erythema and edema on the anterior aspect of the left shin.  Neurological:     Mental Status: She is alert and oriented to person, place, and time.  Psychiatric:        Behavior: Behavior normal.      Musculoskeletal Exam: C-spine, thoracic, and lumbar spine have limited and painful ROM.  Right shoulder abduction to about 70 degrees.  Left shoulder has full ROM.  Elbow joints, wrist joints, MCPs, PIPs, and DIPs good ROM with no synovitis.   PIP and DIP thickening consistent with osteoarthritis of both hands.  CMC joint thickening bilaterally. Hip joints difficult to assess in seated position.  Bilateral knee replacements have good ROM with no discomfort. Pedal edema noted bilaterally, L>R.  No tenderness or warmth of ankle joints noted.   CDAI Exam: CDAI Score: - Patient Global: -; Provider Global: - Swollen: -; Tender: - Joint Exam 11/06/2019   No joint exam has been documented for this visit   There is currently no information documented on the homunculus. Go to the Rheumatology activity and complete the homunculus joint exam.  Investigation: No additional findings.  Imaging: No results found.  Recent Labs: Lab Results  Component Value Date   WBC 7.2 09/09/2019   HGB 13.5 09/09/2019   PLT 163 09/09/2019   NA 141 09/09/2019   K 4.7 09/09/2019   CL 104 09/09/2019   CO2 24 09/09/2019   GLUCOSE 110 (H) 09/09/2019   BUN 16 09/09/2019   CREATININE 0.64 09/09/2019   BILITOT 0.4 09/09/2019   ALKPHOS 36 (L) 03/07/2019   AST 21 09/09/2019   ALT 16 09/09/2019   PROT 6.7 09/09/2019   ALBUMIN 3.4 (L)  03/07/2019   CALCIUM 9.6 09/09/2019   GFRAA 102 09/09/2019    Speciality Comments: PLQ Eye Exam: 05/30/2019 WNL @ [facility].  Procedures:  No procedures performed Allergies: Statins,  Diazepam, Fentanyl, Morphine and related, and Other   Assessment / Plan:     Visit Diagnoses: Rheumatoid arthritis of multiple sites with negative rheumatoid factor ([place]): She has no synovitis on exam today.  She has not had any recent rheumatoid arthritis flares.  She is clinically doing well on Plaquenil 200 mg 1 tablet by mouth twice daily.  She previously discontinued methotrexate in July 2019 due to developing a ulceration on her left lower extremity.  She has a persistent nonhealing ulceration with surrounding erythema and edema on the anterior surface of the left shin.  She has been followed closely by her dermatologist.  She has tried several topical agents and has been on several rounds of antibiotics in the past.  According to the patient the ulceration seems to be improving.  She continues to have chronic pain in both hands but no inflammation was noted.  She has PIP, DIP, and CMC joint thickening consistent with osteoarthritis of both hands.  Joint protection and muscle strengthening were discussed.  She will continue taking Plaquenil 200 mg 1 tablet by mouth twice daily.  She does not need a refill at this time.  She was advised to notify us if she develops increased joint pain or joint swelling.  She will follow-up in the office in 5 months.  High risk medication use - PLQ 200 mg 1 tablet po bid, MTX was discontinued in July 2019 due to lower extremity ulcers. Plaquenil eye exam normal on 05/30/19.  CBC and CMP were within normal limits on 09/09/2019.  She will be due to update lab work in July and every 5 months.  According to the patient she has upcoming physical exam with her PCP next month and will have lab work faxed to our office.  She has received both COVID-19 vaccinations.  Ulcer of left  lower extremity, limited to breakdown of skin [facility]) - Patient has been followed by her PCP and dermatologist.  She has a present stent nonhealing ulceration on the anterior surface of the left shin.  She is surrounding erythema and edema.  According to the patient the ulceration seems to be improving.  She would not be a good candidate for more aggressive immunosuppression at this time.  DDD (degenerative disc disease), cervical: She has limited lateral rotation bilaterally.  She is not experiencing any symptoms of radiculopathy at this time.  She is followed by Dr. Louanne Skye.  She has started using hemp oil as recommended by Dr. Louanne Skye and has noticed some improvement in her overall discomfort.  DDD (degenerative disc disease), lumbar: Chronic pain.  She has limited range of motion with discomfort.  She is followed by Dr. Donavan Burnet.  She is not a surgical candidate due to her other underlying comorbidities.  History of total knee replacement, bilateral: Doing well.  She has good range of motion with no discomfort at this time.  She walks with a cane to assist with ambulation.  Other medical conditions are listed as follows:  History of diabetes mellitus  Varicose veins of bilateral lower extremities with other complications  Pedal edema  History of hypertension  History of vitamin D deficiency  History of breast cancer  History of sleep apnea  History of coronary artery disease  Orders: No orders of the defined types were placed in this encounter.  No orders of the defined types were placed in this encounter.     Follow-Up Instructions: Return in about 5 months (around 04/07/2020) for Rheumatoid arthritis, DDD.  Hazel Sams, PA-C  I examined and evaluated the patient with Hazel Sams PA.  Patient had no synovitis on my examination.  She is some synovial thickening.  She continues to have some generalized pain and discomfort from underlying osteoarthritis.  The plan of care was  discussed as noted above.  Bo Merino, MD  Note - This record has been created using Editor, commissioning.  Chart creation errors have been sought, but may not always  have been located. Such creation errors do not reflect on  the standard of medical care.

## 2019-11-04 DIAGNOSIS — U071 COVID-19: Secondary | ICD-10-CM | POA: Diagnosis not present

## 2019-11-04 DIAGNOSIS — R0602 Shortness of breath: Secondary | ICD-10-CM | POA: Diagnosis not present

## 2019-11-04 DIAGNOSIS — J1289 Other viral pneumonia: Secondary | ICD-10-CM | POA: Diagnosis not present

## 2019-11-04 DIAGNOSIS — J9621 Acute and chronic respiratory failure with hypoxia: Secondary | ICD-10-CM | POA: Diagnosis not present

## 2019-11-06 ENCOUNTER — Ambulatory Visit: Payer: Medicare HMO | Admitting: Rheumatology

## 2019-11-06 ENCOUNTER — Other Ambulatory Visit: Payer: Self-pay

## 2019-11-06 ENCOUNTER — Encounter: Payer: Self-pay | Admitting: Rheumatology

## 2019-11-06 VITALS — BP 140/78 | HR 71 | Resp 20 | Ht 64.0 in | Wt 239.8 lb

## 2019-11-06 DIAGNOSIS — R600 Localized edema: Secondary | ICD-10-CM

## 2019-11-06 DIAGNOSIS — Z8639 Personal history of other endocrine, nutritional and metabolic disease: Secondary | ICD-10-CM

## 2019-11-06 DIAGNOSIS — M5030 Other cervical disc degeneration, unspecified cervical region: Secondary | ICD-10-CM | POA: Diagnosis not present

## 2019-11-06 DIAGNOSIS — M0609 Rheumatoid arthritis without rheumatoid factor, multiple sites: Secondary | ICD-10-CM

## 2019-11-06 DIAGNOSIS — Z8669 Personal history of other diseases of the nervous system and sense organs: Secondary | ICD-10-CM

## 2019-11-06 DIAGNOSIS — M5136 Other intervertebral disc degeneration, lumbar region: Secondary | ICD-10-CM | POA: Diagnosis not present

## 2019-11-06 DIAGNOSIS — I83893 Varicose veins of bilateral lower extremities with other complications: Secondary | ICD-10-CM

## 2019-11-06 DIAGNOSIS — Z79899 Other long term (current) drug therapy: Secondary | ICD-10-CM | POA: Diagnosis not present

## 2019-11-06 DIAGNOSIS — L97921 Non-pressure chronic ulcer of unspecified part of left lower leg limited to breakdown of skin: Secondary | ICD-10-CM | POA: Diagnosis not present

## 2019-11-06 DIAGNOSIS — Z853 Personal history of malignant neoplasm of breast: Secondary | ICD-10-CM

## 2019-11-06 DIAGNOSIS — Z8679 Personal history of other diseases of the circulatory system: Secondary | ICD-10-CM

## 2019-11-06 DIAGNOSIS — Z96653 Presence of artificial knee joint, bilateral: Secondary | ICD-10-CM

## 2019-11-06 NOTE — Patient Instructions (Signed)
Standing Labs We placed an order today for your standing lab work.    Please come back and get your standing labs in July and every 5 months  We have open lab daily Monday through Thursday from 8:30-12:30 PM and 1:30-4:30 PM and Friday from 8:30-12:30 PM and 1:30-4:00 PM at the office of Dr. Shaili Deveshwar.   You may experience shorter wait times on Monday and Friday afternoons. The office is located at [address], [address], [place], [place] [address] No appointment is necessary.   Labs are drawn by [facility].  You may receive a bill from [facility] for your lab work.  If you wish to have your labs drawn at another location, please call the office 24 hours in advance to send orders.  If you have any questions regarding directions or hours of operation,  please call [phone].   Just as a reminder please drink plenty of water prior to coming for your lab work. Thanks!  

## 2019-11-19 DIAGNOSIS — Z0000 Encounter for general adult medical examination without abnormal findings: Secondary | ICD-10-CM | POA: Diagnosis not present

## 2019-11-19 DIAGNOSIS — E1151 Type 2 diabetes mellitus with diabetic peripheral angiopathy without gangrene: Secondary | ICD-10-CM | POA: Diagnosis not present

## 2019-11-19 DIAGNOSIS — E7849 Other hyperlipidemia: Secondary | ICD-10-CM | POA: Diagnosis not present

## 2019-11-19 DIAGNOSIS — E041 Nontoxic single thyroid nodule: Secondary | ICD-10-CM | POA: Diagnosis not present

## 2019-11-22 ENCOUNTER — Other Ambulatory Visit: Payer: Self-pay | Admitting: Rheumatology

## 2019-11-22 DIAGNOSIS — M0609 Rheumatoid arthritis without rheumatoid factor, multiple sites: Secondary | ICD-10-CM

## 2019-11-22 NOTE — Telephone Encounter (Signed)
Last Visit: 11/06/2019 Next Visit: 04/08/2020 Labs: 09/09/2019 CBC is normal. CMP shows mild elevation of glucose probably not fasting. Eye exam: 05/30/2019  Okay to refill per Dr. Estanislado Pandy.

## 2019-11-26 DIAGNOSIS — Z8616 Personal history of COVID-19: Secondary | ICD-10-CM | POA: Diagnosis not present

## 2019-11-26 DIAGNOSIS — D849 Immunodeficiency, unspecified: Secondary | ICD-10-CM | POA: Diagnosis not present

## 2019-11-26 DIAGNOSIS — Z6841 Body Mass Index (BMI) 40.0 and over, adult: Secondary | ICD-10-CM | POA: Diagnosis not present

## 2019-11-26 DIAGNOSIS — Z0000 Encounter for general adult medical examination without abnormal findings: Secondary | ICD-10-CM | POA: Diagnosis not present

## 2019-11-26 DIAGNOSIS — E1151 Type 2 diabetes mellitus with diabetic peripheral angiopathy without gangrene: Secondary | ICD-10-CM | POA: Diagnosis not present

## 2019-11-26 DIAGNOSIS — Z1331 Encounter for screening for depression: Secondary | ICD-10-CM | POA: Diagnosis not present

## 2019-11-26 DIAGNOSIS — R251 Tremor, unspecified: Secondary | ICD-10-CM | POA: Diagnosis not present

## 2019-11-26 DIAGNOSIS — Z9981 Dependence on supplemental oxygen: Secondary | ICD-10-CM | POA: Diagnosis not present

## 2019-11-26 DIAGNOSIS — S81802D Unspecified open wound, left lower leg, subsequent encounter: Secondary | ICD-10-CM | POA: Diagnosis not present

## 2019-11-26 DIAGNOSIS — M069 Rheumatoid arthritis, unspecified: Secondary | ICD-10-CM | POA: Diagnosis not present

## 2019-11-26 DIAGNOSIS — D8989 Other specified disorders involving the immune mechanism, not elsewhere classified: Secondary | ICD-10-CM | POA: Diagnosis not present

## 2019-11-26 DIAGNOSIS — R82998 Other abnormal findings in urine: Secondary | ICD-10-CM | POA: Diagnosis not present

## 2019-11-26 DIAGNOSIS — I87333 Chronic venous hypertension (idiopathic) with ulcer and inflammation of bilateral lower extremity: Secondary | ICD-10-CM | POA: Diagnosis not present

## 2019-11-28 DIAGNOSIS — K921 Melena: Secondary | ICD-10-CM | POA: Diagnosis not present

## 2019-12-02 DIAGNOSIS — E1151 Type 2 diabetes mellitus with diabetic peripheral angiopathy without gangrene: Secondary | ICD-10-CM | POA: Diagnosis not present

## 2019-12-04 DIAGNOSIS — J9621 Acute and chronic respiratory failure with hypoxia: Secondary | ICD-10-CM | POA: Diagnosis not present

## 2019-12-04 DIAGNOSIS — J1289 Other viral pneumonia: Secondary | ICD-10-CM | POA: Diagnosis not present

## 2019-12-04 DIAGNOSIS — U071 COVID-19: Secondary | ICD-10-CM | POA: Diagnosis not present

## 2019-12-04 DIAGNOSIS — R0602 Shortness of breath: Secondary | ICD-10-CM | POA: Diagnosis not present

## 2020-01-01 ENCOUNTER — Other Ambulatory Visit: Payer: Self-pay | Admitting: Rheumatology

## 2020-01-01 DIAGNOSIS — M0609 Rheumatoid arthritis without rheumatoid factor, multiple sites: Secondary | ICD-10-CM

## 2020-01-02 DIAGNOSIS — L950 Livedoid vasculitis: Secondary | ICD-10-CM | POA: Diagnosis not present

## 2020-01-02 DIAGNOSIS — L821 Other seborrheic keratosis: Secondary | ICD-10-CM | POA: Diagnosis not present

## 2020-01-04 DIAGNOSIS — U071 COVID-19: Secondary | ICD-10-CM | POA: Diagnosis not present

## 2020-01-04 DIAGNOSIS — J1289 Other viral pneumonia: Secondary | ICD-10-CM | POA: Diagnosis not present

## 2020-01-04 DIAGNOSIS — R0602 Shortness of breath: Secondary | ICD-10-CM | POA: Diagnosis not present

## 2020-01-04 DIAGNOSIS — J9621 Acute and chronic respiratory failure with hypoxia: Secondary | ICD-10-CM | POA: Diagnosis not present

## 2020-01-15 DIAGNOSIS — M47894 Other spondylosis, thoracic region: Secondary | ICD-10-CM | POA: Diagnosis not present

## 2020-01-15 DIAGNOSIS — Z79899 Other long term (current) drug therapy: Secondary | ICD-10-CM | POA: Diagnosis not present

## 2020-01-15 DIAGNOSIS — M47812 Spondylosis without myelopathy or radiculopathy, cervical region: Secondary | ICD-10-CM | POA: Diagnosis not present

## 2020-01-15 DIAGNOSIS — M47816 Spondylosis without myelopathy or radiculopathy, lumbar region: Secondary | ICD-10-CM | POA: Diagnosis not present

## 2020-01-23 ENCOUNTER — Ambulatory Visit: Payer: Medicare HMO | Admitting: Orthopedic Surgery

## 2020-01-23 ENCOUNTER — Ambulatory Visit: Payer: Self-pay

## 2020-01-23 DIAGNOSIS — M25512 Pain in left shoulder: Secondary | ICD-10-CM

## 2020-01-23 DIAGNOSIS — M7582 Other shoulder lesions, left shoulder: Secondary | ICD-10-CM

## 2020-01-27 ENCOUNTER — Encounter: Payer: Self-pay | Admitting: Orthopedic Surgery

## 2020-01-27 NOTE — Progress Notes (Signed)
Office Visit Note   Patient: [name]           Date of Birth: 10-22-1945           MRN: [id number] Visit Date: 01/23/2020 Requested by: Crist Infante, MD [address] [place],  [place] [address] PCP: Crist Infante, MD  Subjective: Chief Complaint  Patient presents with  . Left Shoulder - Pain    HPI: [name] is a 74 y.o. female who presents to the office complaining of left shoulder pain.  Patient notes pain for the last several months.  She denies any acute injury leading to onset of pain.  She has a history of right rotator cuff arthropathy and as a result has been using her left upper extremity more throughout her daily life.  She has no history of left shoulder surgery.  She describes her left shoulder symptoms as anterior pain with radiation to the elbow times.  She does have a history of rheumatoid arthritis for which she takes Plaquenil and is no longer taking methotrexate.  She has also been using hemp oil for her symptoms which provided mild relief.  She denies any new onset neck pain.  Pain does wake her up at night.  She feels subjectively weak in the left arm.  She does have a history of diabetes and her last A1c was 7.0..                ROS:  All systems reviewed are negative as they relate to the chief complaint within the history of present illness.  Patient denies fevers or chills.  Assessment & Plan: Visit Diagnoses:  1. Rotator cuff tendinitis, left   2. Left shoulder pain, unspecified chronicity     Plan: Patient is a 74 year old female presents complaint of left shoulder pain.  She has a history of right shoulder rotator cuff arthropathy.  She notes left shoulder pain for the last several months.  On exam she has excellent functional range of motion good strength of the rotator cuff but she does have grinding that is felt in the anterior shoulder with passive range of motion.  Radiographs reveal degeneration of the [facility] joint and mild loss of acromiohumeral  interval.  AC joint is palpated but not significantly tender on exam.  With her waking with pain as well as the subjective weakness and grinding felt on exam with passive range of motion, impression is partial tear of the rotator cuff versus tendinitis of the rotator cuff.  Left shoulder subacromial injection was administered today and patient tolerated the procedure well.  She will follow-up in 6 weeks for clinical recheck.  If pain is no better, will consider MRI scan at that time.  Patient agreed with plan.  Follow-up in 6 weeks.  Follow-Up Instructions: No follow-ups on file.   Orders:  Orders Placed This Encounter  Procedures  . XR Shoulder Left   No orders of the defined types were placed in this encounter.     Procedures: Large Joint Inj: L glenohumeral on 01/28/2020 7:28 AM Indications: diagnostic evaluation and pain Details: 18 G 1.5 in needle, posterior approach  Arthrogram: No  Medications: 9 mL bupivacaine 0.5 %; 40 mg methylPREDNISolone acetate 40 MG/ML; 5 mL lidocaine 1 % Outcome: tolerated well, no immediate complications Procedure, treatment alternatives, risks and benefits explained, specific risks discussed. Consent was given by the patient. Immediately prior to procedure a time out was called to verify the correct patient, procedure, equipment, support staff  and site/side marked as required. Patient was prepped and draped in the usual sterile fashion.       Clinical Data: No additional findings.  Objective: Vital Signs: There were no vitals taken for this visit.  Physical Exam:  Constitutional: Patient appears well-developed HEENT:  Head: Normocephalic Eyes:EOM are normal Neck: Normal range of motion Cardiovascular: Normal rate Pulmonary/chest: Effort normal Neurologic: Patient is alert Skin: Skin is warm Psychiatric: Patient has normal mood and affect  Ortho Exam:  Left shoulder Exam Able to forward flex and abduct shoulder overhead with excellent  functional range of motion Grinding is felt in the anterior shoulder with passive range of motion No loss of ER relative to the other shoulder.  Good endpoint with ER Good subscapularis, supraspinatus, and infraspinatus strength Negative Hawkins impingement 5/5 grip strength, forearm pronation/supination, and bicep strength No significant tenderness to palpation throughout the axial cervical spine or pain with cervical range of motion  Specialty Comments:  No specialty comments available.  Imaging: No results found.   PMFS History: Patient Active Problem List   Diagnosis Date Noted  . Acute on chronic respiratory failure with hypoxia ([place]) 02/28/2019  . OSA on CPAP 02/28/2019  . Diabetes mellitus type 2, uncontrolled, with complications ([place]) 50/27/7412  . Breast cancer ([place]) 02/27/2019  . History of cancer chemotherapy 02/27/2019  . History of radiation therapy 02/27/2019  . Diabetic neuropathy ([place]) 02/27/2019  . HLD (hyperlipidemia) 02/27/2019  . Essential hypertension 02/27/2019  . QT prolongation 02/27/2019  . Pneumonia due to COVID-19 virus 02/24/2019  . Diarrhea   . Benign neoplasm of cecum   . Abdominal pain, epigastric   . Gastroesophageal reflux disease   . Dependence on nocturnal oxygen therapy 07/10/2017  . Open wound of left lower leg 12/26/2016  . Vitamin D deficiency 09/14/2016  . History of total knee replacement, bilateral 09/14/2016  . DJD (degenerative joint disease), cervical 09/14/2016  . 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 High risk medication use 05/20/2016  . Spinal stenosis, lumbar region, with neurogenic claudication 09/08/2015    Class: Chronic  . OSA (obstructive sleep apnea) 10/17/2013  . Dyspnea 09/04/2013  . Atypical chest pain 09/04/2013  . Morbid obesity ([place]) 03/21/2012  . Cancer of lower-outer quadrant of female breast ([place]) 03/15/2012  . Diabetes mellitus, type II ([place]) 02/22/2011  .  Coronary artery disease   . Hypertension   . Hyperlipidemia   . OA (osteoarthritis) of knee    Past Medical History:  Diagnosis Date  . Actinic keratosis   . Asthma    Albuterol inhaler as needed.Pulmicort neb as needed  . Asthma   . Breast cancer ([place])    right - lumpectomy   . Chronic back pain    spinal stenosis  . Coronary artery disease   . Dependence on nocturnal oxygen therapy 07/10/2017   Pt uses 2 liters 02 at night   . Depression    takes CYmbalta and Wellbutrin daily  . Diabetes mellitus    not on any meds/controlled by diet  . Dyspnea    with exertion occasionally when lies down  . GERD (gastroesophageal reflux disease)    takes Omeprazole daily  . Headache    oocasionally  . History of kidney stones   . Hyperlipidemia    not on any meds  . Hypertension    takes Benazepril,Bystolic,and Amlodipine daily  . IBS (irritable bowel syndrome)   . Joint pain   . Joint swelling   .  Livedoid vasculitis   . Nocturia   . OA (osteoarthritis) of knee   . OSA (obstructive sleep apnea)   . Other seborrheic keratosis   . Oxygen deficiency    2 liters at night per pt for OSA- no cpap   . Peripheral edema    takes daily as needed  . Peripheral neuropathy   . Personal history of chemotherapy   . Personal history of radiation therapy   . Pneumonia    hx of-several yrs ago  . RA (rheumatoid arthritis) ([place])   . Sleep apnea    pt states she uses oxygen at night - no cpap- uses 02 2 liters at night   . Urinary frequency   . Urinary urgency   . Weakness    numbness and tingling mainly in left leg occasionally in right.Tingling/numbness in hands    Family History  Problem Relation Age of Onset  . Heart disease Mother   . Cancer Father   . Cancer Brother   . Heart disease Brother     Past Surgical History:  Procedure Laterality Date  . BREAST LUMPECTOMY  1997   RIGHT BREAST  . CARDIAC CATHETERIZATION  04/23/2004   EF 60%  . cataract surgery Bilateral   .  COLONOSCOPY    . COLONOSCOPY WITH PROPOFOL N/A 08/07/2017   Procedure: COLONOSCOPY WITH PROPOFOL;  Surgeon: Ladene Artist, MD;  Location: [facility];  Service: Endoscopy;  Laterality: N/A;  . CORONARY ARTERY BYPASS GRAFT  2005   LIMA GRAFT TO LAD, SAPHENOUS VEIN GRAFT TO THE FIRST DIAGONAL, AND LEFT RADIAL ARTERY GRAFT TO THE OM  . ESOPHAGOGASTRODUODENOSCOPY (EGD) WITH PROPOFOL N/A 08/07/2017   Procedure: ESOPHAGOGASTRODUODENOSCOPY (EGD) WITH PROPOFOL;  Surgeon: Ladene Artist, MD;  [facility];  Service: Endoscopy;  Laterality: N/A;  . FOOT SURGERY Right   . JOINT REPLACEMENT    . LITHOTRIPSY    . LUMBAR LAMINECTOMY/DECOMPRESSION MICRODISCECTOMY N/A 09/08/2015   Procedure: CENTRAL DECOMPRESSIVE LUMBAR LAMINECTOMIES L3-4, L4-5 AND BILATERAL HEMILAMINECTOMY L5-S1;  Surgeon: Jessy Oto, MD;  Location: [facility];  Service: Orthopedics;  Laterality: N/A;  . nodule removed from left elbow    . SHOULDER ARTHROSCOPY    . TOTAL KNEE ARTHROPLASTY Bilateral   . TOTAL SHOULDER ARTHROPLASTY    . TRIPLE BYPASS  04/27/05  . US ECHOCARDIOGRAPHY  01/06/2009   EF 55-60%  . WRIST SURGERY Left    Social History   Occupational History  . Occupation: retired    Fish farm manager: UNEMPLOYED  Tobacco Use  . Smoking status: Never Smoker  . Smokeless tobacco: Never Used  Vaping Use  . Vaping Use: Never used  Substance and Sexual Activity  . Alcohol use: No    Alcohol/week: 0.0 standard drinks  . Drug use: Never  . Sexual activity: Not on file

## 2020-01-28 DIAGNOSIS — M7582 Other shoulder lesions, left shoulder: Secondary | ICD-10-CM

## 2020-01-28 DIAGNOSIS — M47894 Other spondylosis, thoracic region: Secondary | ICD-10-CM | POA: Diagnosis not present

## 2020-01-28 DIAGNOSIS — M5030 Other cervical disc degeneration, unspecified cervical region: Secondary | ICD-10-CM | POA: Diagnosis not present

## 2020-01-28 DIAGNOSIS — Z96653 Presence of artificial knee joint, bilateral: Secondary | ICD-10-CM | POA: Diagnosis not present

## 2020-01-28 DIAGNOSIS — M069 Rheumatoid arthritis, unspecified: Secondary | ICD-10-CM | POA: Diagnosis not present

## 2020-01-28 MED ORDER — METHYLPREDNISOLONE ACETATE 40 MG/ML IJ SUSP
40.0000 mg | INTRAMUSCULAR | Status: AC | PRN
  Administered 2020-01-28: 40 mg via INTRA_ARTICULAR

## 2020-01-28 MED ORDER — LIDOCAINE HCL 1 % IJ SOLN
5.0000 mL | INTRAMUSCULAR | Status: AC | PRN
  Administered 2020-01-28: 5 mL

## 2020-01-28 MED ORDER — BUPIVACAINE HCL 0.5 % IJ SOLN
9.0000 mL | INTRAMUSCULAR | Status: AC | PRN
  Administered 2020-01-28: 9 mL via INTRA_ARTICULAR

## 2020-02-03 DIAGNOSIS — J9621 Acute and chronic respiratory failure with hypoxia: Secondary | ICD-10-CM | POA: Diagnosis not present

## 2020-02-03 DIAGNOSIS — U071 COVID-19: Secondary | ICD-10-CM | POA: Diagnosis not present

## 2020-02-03 DIAGNOSIS — R0602 Shortness of breath: Secondary | ICD-10-CM | POA: Diagnosis not present

## 2020-02-03 DIAGNOSIS — J1289 Other viral pneumonia: Secondary | ICD-10-CM | POA: Diagnosis not present

## 2020-02-19 ENCOUNTER — Other Ambulatory Visit: Payer: Self-pay | Admitting: Rheumatology

## 2020-02-19 DIAGNOSIS — M0609 Rheumatoid arthritis without rheumatoid factor, multiple sites: Secondary | ICD-10-CM

## 2020-02-19 NOTE — Telephone Encounter (Signed)
Last Visit: 11/06/2019 Next Visit: 04/08/2020 Labs: 09/09/2019 CBC is normal. CMP shows mild elevation of glucose probably not fasting Eye exam: 05/30/2019 WNL  Current Dose per office note 11/06/2019 : Plaquenil 200 mg 1 tablet by mouth twice daily DX: Rheumatoid arthritis of multiple sites with negative rheumatoid factor   Left message to advise patient she is due to update labs.  Okay to refill 30 day supply Plaquenil?

## 2020-02-28 DIAGNOSIS — M47894 Other spondylosis, thoracic region: Secondary | ICD-10-CM | POA: Diagnosis not present

## 2020-02-28 DIAGNOSIS — M47812 Spondylosis without myelopathy or radiculopathy, cervical region: Secondary | ICD-10-CM | POA: Diagnosis not present

## 2020-02-28 DIAGNOSIS — M069 Rheumatoid arthritis, unspecified: Secondary | ICD-10-CM | POA: Diagnosis not present

## 2020-02-28 DIAGNOSIS — M5030 Other cervical disc degeneration, unspecified cervical region: Secondary | ICD-10-CM | POA: Diagnosis not present

## 2020-02-28 DIAGNOSIS — Z79899 Other long term (current) drug therapy: Secondary | ICD-10-CM | POA: Diagnosis not present

## 2020-03-02 ENCOUNTER — Encounter: Payer: Self-pay | Admitting: Rheumatology

## 2020-03-02 ENCOUNTER — Other Ambulatory Visit: Payer: Self-pay | Admitting: Physician Assistant

## 2020-03-02 DIAGNOSIS — Z79899 Other long term (current) drug therapy: Secondary | ICD-10-CM

## 2020-03-02 DIAGNOSIS — E119 Type 2 diabetes mellitus without complications: Secondary | ICD-10-CM | POA: Diagnosis not present

## 2020-03-02 DIAGNOSIS — H35372 Puckering of macula, left eye: Secondary | ICD-10-CM | POA: Diagnosis not present

## 2020-03-02 DIAGNOSIS — H04123 Dry eye syndrome of bilateral lacrimal glands: Secondary | ICD-10-CM | POA: Diagnosis not present

## 2020-03-02 LAB — COMPLETE METABOLIC PANEL WITHOUT GFR
AG Ratio: 2 (calc) (ref 1.0–2.5)
ALT: 20 U/L (ref 6–29)
AST: 19 U/L (ref 10–35)
Albumin: 4.2 g/dL (ref 3.6–5.1)
Alkaline phosphatase (APISO): 43 U/L (ref 37–153)
BUN/Creatinine Ratio: 28 (calc) — ABNORMAL HIGH (ref 6–22)
BUN: 16 mg/dL (ref 7–25)
CO2: 28 mmol/L (ref 20–32)
Calcium: 9.1 mg/dL (ref 8.6–10.4)
Chloride: 102 mmol/L (ref 98–110)
Creat: 0.58 mg/dL — ABNORMAL LOW (ref 0.60–0.93)
GFR, Est African American: 104 mL/min/{1.73_m2}
GFR, Est Non African American: 90 mL/min/{1.73_m2}
Globulin: 2.1 g/dL (ref 1.9–3.7)
Glucose, Bld: 134 mg/dL — ABNORMAL HIGH (ref 65–99)
Potassium: 4.2 mmol/L (ref 3.5–5.3)
Sodium: 141 mmol/L (ref 135–146)
Total Bilirubin: 0.4 mg/dL (ref 0.2–1.2)
Total Protein: 6.3 g/dL (ref 6.1–8.1)

## 2020-03-02 LAB — HM DIABETES EYE EXAM

## 2020-03-02 LAB — CBC WITH DIFFERENTIAL/PLATELET
Absolute Monocytes: 639 cells/uL (ref 200–950)
Basophils Absolute: 28 cells/uL (ref 0–200)
Basophils Relative: 0.4 %
Eosinophils Absolute: 71 cells/uL (ref 15–500)
Eosinophils Relative: 1 %
HCT: 40.2 % (ref 35.0–45.0)
Hemoglobin: 13.1 g/dL (ref 11.7–15.5)
Lymphs Abs: 2677 cells/uL (ref 850–3900)
MCH: 30 pg (ref 27.0–33.0)
MCHC: 32.6 g/dL (ref 32.0–36.0)
MCV: 92.2 fL (ref 80.0–100.0)
MPV: 11.1 fL (ref 7.5–12.5)
Monocytes Relative: 9 %
Neutro Abs: 3685 cells/uL (ref 1500–7800)
Neutrophils Relative %: 51.9 %
Platelets: 161 10*3/uL (ref 140–400)
RBC: 4.36 10*6/uL (ref 3.80–5.10)
RDW: 13 % (ref 11.0–15.0)
Total Lymphocyte: 37.7 %
WBC: 7.1 10*3/uL (ref 3.8–10.8)

## 2020-03-03 NOTE — Progress Notes (Signed)
CBC and CMP are normal.  Glucose is mildly elevated probably not fasting.

## 2020-03-05 ENCOUNTER — Ambulatory Visit: Payer: Medicare HMO | Admitting: Orthopedic Surgery

## 2020-03-05 DIAGNOSIS — U071 COVID-19: Secondary | ICD-10-CM | POA: Diagnosis not present

## 2020-03-05 DIAGNOSIS — J9621 Acute and chronic respiratory failure with hypoxia: Secondary | ICD-10-CM | POA: Diagnosis not present

## 2020-03-05 DIAGNOSIS — R0602 Shortness of breath: Secondary | ICD-10-CM | POA: Diagnosis not present

## 2020-03-05 DIAGNOSIS — J1289 Other viral pneumonia: Secondary | ICD-10-CM | POA: Diagnosis not present

## 2020-03-15 ENCOUNTER — Other Ambulatory Visit: Payer: Self-pay | Admitting: Physician Assistant

## 2020-03-15 DIAGNOSIS — M0609 Rheumatoid arthritis without rheumatoid factor, multiple sites: Secondary | ICD-10-CM

## 2020-03-16 NOTE — Telephone Encounter (Signed)
Last Visit: 11/06/2019 Next Visit: 04/08/2020 Labs: 03/02/2020 CBC and CMP are normal. Glucose is mildly elevated probably not fasting Eye exam: 05/30/2019 WNL  Current Dose per office note 11/06/2019 : Plaquenil 200 mg 1 tablet by mouth twice daily DX: Rheumatoid arthritis of multiple sites with negative rheumatoid factor   Okay to refill per Dr. Estanislado Pandy

## 2020-03-26 NOTE — Progress Notes (Signed)
Office Visit Note  Patient: [name]             Date of Birth: 27-Aug-1945           MRN: [id number]             PCP: Crist Infante, MD Referring: Crist Infante, MD Visit Date: 04/08/2020 Occupation: @GUAROCC @  Subjective:  Pain in both hands  History of Present Illness: AIJA SCARFO is a 74 y.o. female with history of rheumatoid arthritis and DDD.  She is taking plaquenil 200 mg 1 tablet by mouth twice daily.  She has been tolerating Plaquenil without any side effects and has not missed any doses recently.  She denies any recent rheumatoid arthritis flares.  She states that she feels as though Plaquenil is still controlling her rheumatoid arthritis.  She does experience pain and stiffness in both hands due to underlying osteoarthritis.  She has chronic lower back pain and has noticed some discomfort radiating down her left lower extremity.  She walks with a cane to assist with ambulation.  She is followed by Dr. Louanne Skye and has had injections by Dr. Ernestina Patches in the past which did not provide any relief.  She has been going to pain management which has been helping alleviate some of her pain.   She has received all 3 covid-19 vaccines.  She continues to use oxygen at night after having covid last year.     Activities of Daily Living:  Patient reports morning stiffness for 2  hours.   Patient Reports nocturnal pain.  Difficulty dressing/grooming: Reports Difficulty climbing stairs: Reports Difficulty getting out of chair: Reports Difficulty using hands for taps, buttons, cutlery, and/or writing: Reports  Review of Systems  Constitutional: Positive for fatigue.  HENT: Positive for mouth dryness. Negative for mouth sores and nose dryness.   Eyes: Positive for pain. Negative for itching and dryness.  Respiratory: Positive for shortness of breath. Negative for difficulty breathing.   Cardiovascular: Negative for chest pain and palpitations.  Gastrointestinal: Positive for diarrhea.  Negative for blood in stool and constipation.  Endocrine: Negative for increased urination.  Genitourinary: Negative for difficulty urinating.  Musculoskeletal: Positive for arthralgias, joint pain, joint swelling and morning stiffness.  Skin: Positive for color change, rash and redness.  Allergic/Immunologic: Negative for susceptible to infections.  Neurological: Positive for numbness, headaches, memory loss and weakness. Negative for dizziness.  Hematological: Negative for bruising/bleeding tendency.  Psychiatric/Behavioral: Negative for confusion.    PMFS History:  Patient Active Problem List   Diagnosis Date Noted  . Acute on chronic respiratory failure with hypoxia ([place]) 02/28/2019  . OSA on CPAP 02/28/2019  . Diabetes mellitus type 2, uncontrolled, with complications ([place]) 64/68/0321  . Breast cancer ([place]) 02/27/2019  . History of cancer chemotherapy 02/27/2019  . History of radiation therapy 02/27/2019  . Diabetic neuropathy ([place]) 02/27/2019  . HLD (hyperlipidemia) 02/27/2019  . Essential hypertension 02/27/2019  . QT prolongation 02/27/2019  . Pneumonia due to COVID-19 virus 02/24/2019  . Diarrhea   . Benign neoplasm of cecum   . Abdominal pain, epigastric   . Gastroesophageal reflux disease   . Dependence on nocturnal oxygen therapy 07/10/2017  . Open wound of left lower leg 12/26/2016  . Vitamin D deficiency 09/14/2016  . History of total knee replacement, bilateral 09/14/2016  . DJD (degenerative joint disease), cervical 09/14/2016  . 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 High risk medication use 05/20/2016  . Spinal stenosis,  lumbar region, with neurogenic claudication 09/08/2015    Class: Chronic  . OSA (obstructive sleep apnea) 10/17/2013  . Dyspnea 09/04/2013  . Atypical chest pain 09/04/2013  . Morbid obesity ([place]) 03/21/2012  . Cancer of lower-outer quadrant of female breast ([place]) 03/15/2012  . Diabetes mellitus,  type II ([place]) 02/22/2011  . Coronary artery disease   . Hypertension   . Hyperlipidemia   . OA (osteoarthritis) of knee     Past Medical History:  Diagnosis Date  . Actinic keratosis   . Asthma    Albuterol inhaler as needed.Pulmicort neb as needed  . Asthma   . Breast cancer ([place])    right - lumpectomy   . Chronic back pain    spinal stenosis  . Coronary artery disease   . Dependence on nocturnal oxygen therapy 07/10/2017   Pt uses 2 liters 02 at night   . Depression    takes CYmbalta and Wellbutrin daily  . Diabetes mellitus    not on any meds/controlled by diet  . Dyspnea    with exertion occasionally when lies down  . GERD (gastroesophageal reflux disease)    takes Omeprazole daily  . Headache    oocasionally  . History of kidney stones   . Hyperlipidemia    not on any meds  . Hypertension    takes Benazepril,Bystolic,and Amlodipine daily  . IBS (irritable bowel syndrome)   . Joint pain   . Joint swelling   . Livedoid vasculitis   . Nocturia   . OA (osteoarthritis) of knee   . OSA (obstructive sleep apnea)   . Other seborrheic keratosis   . Oxygen deficiency    2 liters at night per pt for OSA- no cpap   . Peripheral edema    takes daily as needed  . Peripheral neuropathy   . Personal history of chemotherapy   . Personal history of radiation therapy   . Pneumonia    hx of-several yrs ago  . RA (rheumatoid arthritis) ([place])   . Sleep apnea    pt states she uses oxygen at night - no cpap- uses 02 2 liters at night   . Urinary frequency   . Urinary urgency   . Weakness    numbness and tingling mainly in left leg occasionally in right.Tingling/numbness in hands    Family History  Problem Relation Age of Onset  . Heart disease Mother   . Cancer Father   . Cancer Brother   . Heart disease Brother    Past Surgical History:  Procedure Laterality Date  . BREAST LUMPECTOMY  1997   RIGHT BREAST  . CARDIAC CATHETERIZATION  04/23/2004   EF 60%  . cataract  surgery Bilateral   . COLONOSCOPY    . COLONOSCOPY WITH PROPOFOL N/A 08/07/2017   Procedure: COLONOSCOPY WITH PROPOFOL;  Surgeon: Ladene Artist, MD;  [facility];  Service: Endoscopy;  Laterality: N/A;  . CORONARY ARTERY BYPASS GRAFT  2005   LIMA GRAFT TO LAD, SAPHENOUS VEIN GRAFT TO THE FIRST DIAGONAL, AND LEFT RADIAL ARTERY GRAFT TO THE OM  . ESOPHAGOGASTRODUODENOSCOPY (EGD) WITH PROPOFOL N/A 08/07/2017   Procedure: ESOPHAGOGASTRODUODENOSCOPY (EGD) WITH PROPOFOL;  Surgeon: Ladene Artist, MD;  [facility];  Service: Endoscopy;  Laterality: N/A;  . FOOT SURGERY Right   . JOINT REPLACEMENT    . LITHOTRIPSY    . LUMBAR LAMINECTOMY/DECOMPRESSION MICRODISCECTOMY N/A 09/08/2015   Procedure: CENTRAL DECOMPRESSIVE LUMBAR LAMINECTOMIES L3-4, L4-5 AND BILATERAL HEMILAMINECTOMY L5-S1;  Surgeon:  Jessy Oto, MD;  Location: [facility];  Service: Orthopedics;  Laterality: N/A;  . nodule removed from left elbow    . SHOULDER ARTHROSCOPY    . TOTAL KNEE ARTHROPLASTY Bilateral   . TOTAL SHOULDER ARTHROPLASTY    . TRIPLE BYPASS  04/27/05  . US ECHOCARDIOGRAPHY  01/06/2009   EF 55-60%  . WRIST SURGERY Left    Social History   Social History Narrative  . Not on file   Immunization History  Administered Date(s) Administered  . Influenza Split 04/24/2013  . PFIZER SARS-COV-2 Vaccination 09/01/2019, 09/25/2019, 04/04/2020  . Zoster Recombinat (Shingrix) 10/11/2017, 12/22/2017     Objective: Vital Signs: BP 127/78 (BP Location: Left Arm, Patient Position: Sitting, Cuff Size: Large)   Pulse (!) 54   Resp 17   Ht 5' 4.25" (1.632 m)   Wt 244 lb 9.6 oz (110.9 kg)   BMI 41.66 kg/m    Physical Exam Vitals and nursing note reviewed.  Constitutional:      Appearance: She is well-developed.  HENT:     Head: Normocephalic and atraumatic.  Eyes:     Conjunctiva/sclera: Conjunctivae normal.  Pulmonary:     Effort: Pulmonary effort is normal.  Abdominal:     Palpations: Abdomen  is soft.  Musculoskeletal:     Cervical back: Normal range of motion.  Skin:    General: Skin is warm and dry.     Capillary Refill: Capillary refill takes less than 2 seconds.  Neurological:     Mental Status: She is alert and oriented to person, place, and time.  Psychiatric:        Behavior: Behavior normal.      Musculoskeletal Exam: C-spine limited ROM with lateral rotation with crepitus. Thoracic kyphosis.  Limited and painful ROM of lumbar spine.  Right shoulder limited forward flexion to about 90 degrees.  Left shoulder has full ROM with no discomfort.  Elbow joints, wrist joints, MCPs, PIPs, and DIPs good ROM with no synovitis. PIP and DIP thickening consistent with osteoarthritis of both hands.  [place] joint prominence bilaterally.  Right knee replacement has painful extension with warmth.  Left knee good ROM with no warmth or effusion.  Ankle joints good ROM with tenderness bilaterally.  Pedal edema bilaterally.   CDAI Exam: CDAI Score: 0.7  Patient Global: 5 mm; Provider Global: 2 mm Swollen: 0 ; Tender: 0  Joint Exam 04/08/2020   No joint exam has been documented for this visit   There is currently no information documented on the homunculus. Go to the Rheumatology activity and complete the homunculus joint exam.  Investigation: No additional findings.  Imaging: No results found.  Recent Labs: Lab Results  Component Value Date   WBC 7.1 03/02/2020   HGB 13.1 03/02/2020   PLT 161 03/02/2020   NA 141 03/02/2020   K 4.2 03/02/2020   CL 102 03/02/2020   CO2 28 03/02/2020   GLUCOSE 134 (H) 03/02/2020   BUN 16 03/02/2020   CREATININE 0.58 (L) 03/02/2020   BILITOT 0.4 03/02/2020   ALKPHOS 36 (L) 03/07/2019   AST 19 03/02/2020   ALT 20 03/02/2020   PROT 6.3 03/02/2020   ALBUMIN 3.4 (L) 03/07/2019   CALCIUM 9.1 03/02/2020   GFRAA 104 03/02/2020    Speciality Comments: PLQ Eye Exam: 03/02/2020 WNL @ [facility] Follow up in 6 months.   Procedures:  No  procedures performed Allergies: Statins, Diazepam, Fentanyl, Morphine and related, and Other   Assessment / Plan:  Visit Diagnoses: Rheumatoid arthritis of multiple sites with negative rheumatoid factor ([place]): She has no joint tenderness or synovitis on exam.  She is clinically doing well on Plaquenil 200 mg 1 tablet by mouth twice daily.  She continues to tolerate Plaquenil and has not missed any doses recently.  She experiences discomfort and stiffness in both hands due to underlying osteoarthritis.  She has PIP and DIP thickening consistent with osteoarthritis of both hands.  CMC joint prominence noted bilaterally.  She has chronic neck and lower back pain due to DDD and she is followed by Dr. Louanne Skye as well as pain management.  She will continue taking Plaquenil as prescribed.  She was advised to notify us if she develops increased joint pain or joint swelling.  She will follow-up in the office in 5 months.   High risk medication use - PLQ 200 mg 1 tablet by mouth twice daily.  MTX was discontinued in July 2019 due to lower extremity ulcers. CBC and CMP were drawn on 03/02/2020. PLQ Eye Exam: 03/02/2020 WNL @ [facility] Follow up in 6 months.  She has received all 3 doses of the COVID-19 vaccinations.  She was advised to notify us if she develops the COVID-19 infection in order to receive the antibody infusion.  She was encouraged to continue to wear a mask and social distance.    Ulcer of left lower extremity, limited to breakdown of skin ([place]) - Resolved. Previously followed by her PCP and dermatologist.     DDD (degenerative disc disease), cervical - She is followed by Dr. Louanne Skye.  She has chronic neck pain and stiffness.  No symptoms of radiculopathy.   DDD (degenerative disc disease), lumbar -She has chronic lower back and intermittent left sided radiculopathy.  She uses a cane to assist with ambulation.  She is followed by Dr. Louanne Skye and Dr. Ernestina Patches. She previously had an epidural steroid  injection on 06/21/17.  She is not a surgical candidate.  She has been going to pain management.   History of total knee replacement, bilateral: She has good ROM of the left knee replacement with no warmth or effusion.  Right knee replacement has warmth with some discomfort with extension.  She walks with a cane to assist with ambulation.   Other medical conditions are listed as follows:   History of diabetes mellitus  Pedal edema  Varicose veins of bilateral lower extremities with other complications  History of vitamin D deficiency  History of hypertension  History of sleep apnea  History of breast cancer  History of coronary artery disease  Orders: No orders of the defined types were placed in this encounter.  No orders of the defined types were placed in this encounter.   Follow-Up Instructions: Return in about 5 months (around 09/08/2020) for Rheumatoid arthritis, DDD.   Ofilia Neas, PA-C  Note - This record has been created using Dragon software.  Chart creation errors have been sought, but may not always  have been located. Such creation errors do not reflect on  the standard of medical care.

## 2020-03-31 DIAGNOSIS — M069 Rheumatoid arthritis, unspecified: Secondary | ICD-10-CM | POA: Diagnosis not present

## 2020-03-31 DIAGNOSIS — Z79899 Other long term (current) drug therapy: Secondary | ICD-10-CM | POA: Diagnosis not present

## 2020-03-31 DIAGNOSIS — E1140 Type 2 diabetes mellitus with diabetic neuropathy, unspecified: Secondary | ICD-10-CM | POA: Diagnosis not present

## 2020-03-31 DIAGNOSIS — M47812 Spondylosis without myelopathy or radiculopathy, cervical region: Secondary | ICD-10-CM | POA: Diagnosis not present

## 2020-03-31 DIAGNOSIS — M5030 Other cervical disc degeneration, unspecified cervical region: Secondary | ICD-10-CM | POA: Diagnosis not present

## 2020-04-03 ENCOUNTER — Telehealth: Payer: Self-pay | Admitting: Rheumatology

## 2020-04-03 DIAGNOSIS — L950 Livedoid vasculitis: Secondary | ICD-10-CM | POA: Diagnosis not present

## 2020-04-03 NOTE — Telephone Encounter (Signed)
Reviewed with patient's daughter.   COVID-19 vaccine recommendations:   COVID-19 vaccine is recommended for everyone (unless you are allergic to a vaccine component), even if you are on a medication that suppresses your immune system.   If you are on Methotrexate, Cellcept (mycophenolate), Rinvoq, Morrie Sheldon, and Olumiant- hold the medication for 1 week after each vaccine. Hold Methotrexate for 2 weeks after the single dose COVID-19 vaccine.   If you are on Orencia subcutaneous injection - hold medication one week prior to and one week after the first COVID-19 vaccine dose (only).   If you are on Orencia IV infusions- time vaccination administration so that the first COVID-19 vaccination will occur four weeks after the infusion and postpone the subsequent infusion by one week.   If you are on Cyclophosphamide or Rituxan infusions please contact your doctor prior to receiving the COVID-19 vaccine.   Do not take Tylenol or any anti-inflammatory medications (NSAIDs) 24 hours prior to the COVID-19 vaccination.   There is no direct evidence about the efficacy of the COVID-19 vaccine in individuals who are on medications that suppress the immune system.   Even if you are fully vaccinated, and you are on any medications that suppress your immune system, please continue to wear a mask, maintain at least six feet social distance and practice hand hygiene.   If you develop a COVID-19 infection, please contact your PCP or our office to determine if you need antibody infusion.  The booster vaccine is now available for immunocompromised patients. It is advised that if you had Pfizer vaccine you should get Coca-Cola booster.  If you had a Moderna vaccine then you should get a Moderna booster. Johnson and Wynetta Emery does not have a booster vaccine at this time.  Please see the following web sites for updated information.    https://[email or web address]/[email or web address]/[email or web address]-Vaccination-Patient-Resources.pdf  [email or web address]://[email or web address]/About-Us/Newsroom/Press-Releases/ID/1159

## 2020-04-03 NOTE — Telephone Encounter (Signed)
Patient's daughter Lynelle Smoke called requesting a return call to let her know if Dr. Estanislado Pandy recommends her mom have the booster vaccine since her mom is taking Plaquenil.

## 2020-04-05 DIAGNOSIS — U071 COVID-19: Secondary | ICD-10-CM | POA: Diagnosis not present

## 2020-04-05 DIAGNOSIS — R0602 Shortness of breath: Secondary | ICD-10-CM | POA: Diagnosis not present

## 2020-04-05 DIAGNOSIS — J1289 Other viral pneumonia: Secondary | ICD-10-CM | POA: Diagnosis not present

## 2020-04-05 DIAGNOSIS — J9621 Acute and chronic respiratory failure with hypoxia: Secondary | ICD-10-CM | POA: Diagnosis not present

## 2020-04-08 ENCOUNTER — Other Ambulatory Visit: Payer: Self-pay

## 2020-04-08 ENCOUNTER — Ambulatory Visit: Payer: Medicare HMO | Admitting: Physician Assistant

## 2020-04-08 ENCOUNTER — Encounter: Payer: Self-pay | Admitting: Physician Assistant

## 2020-04-08 VITALS — BP 127/78 | HR 54 | Resp 17 | Ht 64.25 in | Wt 244.6 lb

## 2020-04-08 DIAGNOSIS — R600 Localized edema: Secondary | ICD-10-CM

## 2020-04-08 DIAGNOSIS — M5136 Other intervertebral disc degeneration, lumbar region: Secondary | ICD-10-CM | POA: Diagnosis not present

## 2020-04-08 DIAGNOSIS — M0609 Rheumatoid arthritis without rheumatoid factor, multiple sites: Secondary | ICD-10-CM

## 2020-04-08 DIAGNOSIS — I83893 Varicose veins of bilateral lower extremities with other complications: Secondary | ICD-10-CM

## 2020-04-08 DIAGNOSIS — Z79899 Other long term (current) drug therapy: Secondary | ICD-10-CM

## 2020-04-08 DIAGNOSIS — Z8639 Personal history of other endocrine, nutritional and metabolic disease: Secondary | ICD-10-CM | POA: Diagnosis not present

## 2020-04-08 DIAGNOSIS — Z96653 Presence of artificial knee joint, bilateral: Secondary | ICD-10-CM | POA: Diagnosis not present

## 2020-04-08 DIAGNOSIS — Z8669 Personal history of other diseases of the nervous system and sense organs: Secondary | ICD-10-CM

## 2020-04-08 DIAGNOSIS — M5030 Other cervical disc degeneration, unspecified cervical region: Secondary | ICD-10-CM | POA: Diagnosis not present

## 2020-04-08 DIAGNOSIS — L97921 Non-pressure chronic ulcer of unspecified part of left lower leg limited to breakdown of skin: Secondary | ICD-10-CM

## 2020-04-08 DIAGNOSIS — Z853 Personal history of malignant neoplasm of breast: Secondary | ICD-10-CM

## 2020-04-08 DIAGNOSIS — Z8679 Personal history of other diseases of the circulatory system: Secondary | ICD-10-CM

## 2020-04-08 NOTE — Patient Instructions (Signed)
Hand Exercises Hand exercises can be helpful for almost anyone. These exercises can strengthen the hands, improve flexibility and movement, and increase blood flow to the hands. These results can make work and daily tasks easier. Hand exercises can be especially helpful for people who have joint pain from arthritis or have nerve damage from overuse (carpal tunnel syndrome). These exercises can also help people who have injured a hand. Exercises Most of these hand exercises are gentle stretching and motion exercises. It is usually safe to do them often throughout the day. Warming up your hands before exercise may help to reduce stiffness. You can do this with gentle massage or by placing your hands in warm water for 10-15 minutes. It is normal to feel some stretching, pulling, tightness, or mild discomfort as you begin new exercises. This will gradually improve. Stop an exercise right away if you feel sudden, severe pain or your pain gets worse. Ask your health care provider which exercises are best for you. Knuckle bend or "claw" fist 1. Stand or sit with your arm, hand, and all five fingers pointed straight up. Make sure to keep your wrist straight during the exercise. 2. Gently bend your fingers down toward your palm until the tips of your fingers are touching the top of your palm. Keep your big knuckle straight and just bend the small knuckles in your fingers. 3. Hold this position for __________ seconds. 4. Straighten (extend) your fingers back to the starting position. Repeat this exercise 5-10 times with each hand. Full finger fist 1. Stand or sit with your arm, hand, and all five fingers pointed straight up. Make sure to keep your wrist straight during the exercise. 2. Gently bend your fingers into your palm until the tips of your fingers are touching the middle of your palm. 3. Hold this position for __________ seconds. 4. Extend your fingers back to the starting position, stretching every  joint fully. Repeat this exercise 5-10 times with each hand. Straight fist 1. Stand or sit with your arm, hand, and all five fingers pointed straight up. Make sure to keep your wrist straight during the exercise. 2. Gently bend your fingers at the big knuckle, where your fingers meet your hand, and the middle knuckle. Keep the knuckle at the tips of your fingers straight and try to touch the bottom of your palm. 3. Hold this position for __________ seconds. 4. Extend your fingers back to the starting position, stretching every joint fully. Repeat this exercise 5-10 times with each hand. Tabletop 1. Stand or sit with your arm, hand, and all five fingers pointed straight up. Make sure to keep your wrist straight during the exercise. 2. Gently bend your fingers at the big knuckle, where your fingers meet your hand, as far down as you can while keeping the small knuckles in your fingers straight. Think of forming a tabletop with your fingers. 3. Hold this position for __________ seconds. 4. Extend your fingers back to the starting position, stretching every joint fully. Repeat this exercise 5-10 times with each hand. Finger spread 1. Place your hand flat on a table with your palm facing down. Make sure your wrist stays straight as you do this exercise. 2. Spread your fingers and thumb apart from each other as far as you can until you feel a gentle stretch. Hold this position for __________ seconds. 3. Bring your fingers and thumb tight together again. Hold this position for __________ seconds. Repeat this exercise 5-10 times with each hand.   Making circles 1. Stand or sit with your arm, hand, and all five fingers pointed straight up. Make sure to keep your wrist straight during the exercise. 2. Make a circle by touching the tip of your thumb to the tip of your index finger. 3. Hold for __________ seconds. Then open your hand wide. 4. Repeat this motion with your thumb and each finger on your  hand. Repeat this exercise 5-10 times with each hand. Thumb motion 1. Sit with your forearm resting on a table and your wrist straight. Your thumb should be facing up toward the ceiling. Keep your fingers relaxed as you move your thumb. 2. Lift your thumb up as high as you can toward the ceiling. Hold for __________ seconds. 3. Bend your thumb across your palm as far as you can, reaching the tip of your thumb for the small finger (pinkie) side of your palm. Hold for __________ seconds. Repeat this exercise 5-10 times with each hand. Grip strengthening  1. Hold a stress ball or other soft ball in the middle of your hand. 2. Slowly increase the pressure, squeezing the ball as much as you can without causing pain. Think of bringing the tips of your fingers into the middle of your palm. All of your finger joints should bend when doing this exercise. 3. Hold your squeeze for __________ seconds, then relax. Repeat this exercise 5-10 times with each hand. Contact a health care provider if:  Your hand pain or discomfort gets much worse when you do an exercise.  Your hand pain or discomfort does not improve within 2 hours after you exercise. If you have any of these problems, stop doing these exercises right away. Do not do them again unless your health care provider says that you can. Get help right away if:  You develop sudden, severe hand pain or swelling. If this happens, stop doing these exercises right away. Do not do them again unless your health care provider says that you can. This information is not intended to replace advice given to you by your health care provider. Make sure you discuss any questions you have with your health care provider. Document Revised: 11/01/2018 Document Reviewed: 07/12/2018 Elsevier Patient Education  2020 [employer]vier Inc[employer]  

## 2020-04-09 DIAGNOSIS — C50911 Malignant neoplasm of unspecified site of right female breast: Secondary | ICD-10-CM | POA: Diagnosis not present

## 2020-04-21 DIAGNOSIS — G629 Polyneuropathy, unspecified: Secondary | ICD-10-CM | POA: Diagnosis not present

## 2020-04-21 DIAGNOSIS — M5136 Other intervertebral disc degeneration, lumbar region: Secondary | ICD-10-CM | POA: Diagnosis not present

## 2020-04-21 DIAGNOSIS — G4733 Obstructive sleep apnea (adult) (pediatric): Secondary | ICD-10-CM | POA: Diagnosis not present

## 2020-04-21 DIAGNOSIS — I2510 Atherosclerotic heart disease of native coronary artery without angina pectoris: Secondary | ICD-10-CM | POA: Diagnosis not present

## 2020-04-21 DIAGNOSIS — E7849 Other hyperlipidemia: Secondary | ICD-10-CM | POA: Diagnosis not present

## 2020-04-21 DIAGNOSIS — I10 Essential (primary) hypertension: Secondary | ICD-10-CM | POA: Diagnosis not present

## 2020-04-21 DIAGNOSIS — Z23 Encounter for immunization: Secondary | ICD-10-CM | POA: Diagnosis not present

## 2020-04-21 DIAGNOSIS — E1151 Type 2 diabetes mellitus with diabetic peripheral angiopathy without gangrene: Secondary | ICD-10-CM | POA: Diagnosis not present

## 2020-05-01 ENCOUNTER — Ambulatory Visit: Payer: Medicare HMO | Admitting: Podiatry

## 2020-05-01 ENCOUNTER — Other Ambulatory Visit: Payer: Self-pay

## 2020-05-01 DIAGNOSIS — S91209A Unspecified open wound of unspecified toe(s) with damage to nail, initial encounter: Secondary | ICD-10-CM

## 2020-05-01 DIAGNOSIS — L03032 Cellulitis of left toe: Secondary | ICD-10-CM

## 2020-05-01 DIAGNOSIS — M47816 Spondylosis without myelopathy or radiculopathy, lumbar region: Secondary | ICD-10-CM | POA: Diagnosis not present

## 2020-05-01 DIAGNOSIS — L02612 Cutaneous abscess of left foot: Secondary | ICD-10-CM

## 2020-05-01 DIAGNOSIS — M069 Rheumatoid arthritis, unspecified: Secondary | ICD-10-CM | POA: Diagnosis not present

## 2020-05-01 DIAGNOSIS — M47812 Spondylosis without myelopathy or radiculopathy, cervical region: Secondary | ICD-10-CM | POA: Diagnosis not present

## 2020-05-01 DIAGNOSIS — M5030 Other cervical disc degeneration, unspecified cervical region: Secondary | ICD-10-CM | POA: Diagnosis not present

## 2020-05-01 DIAGNOSIS — M79675 Pain in left toe(s): Secondary | ICD-10-CM

## 2020-05-01 DIAGNOSIS — Z79899 Other long term (current) drug therapy: Secondary | ICD-10-CM | POA: Diagnosis not present

## 2020-05-01 MED ORDER — CEPHALEXIN 500 MG PO CAPS: 500.0000 mg | ORAL_CAPSULE | Freq: Two times a day (BID) | ORAL | 0 refills | Status: DC

## 2020-05-01 NOTE — Patient Instructions (Signed)

## 2020-05-05 ENCOUNTER — Ambulatory Visit: Payer: Medicare HMO | Admitting: Podiatry

## 2020-05-05 DIAGNOSIS — R0602 Shortness of breath: Secondary | ICD-10-CM | POA: Diagnosis not present

## 2020-05-05 DIAGNOSIS — J9621 Acute and chronic respiratory failure with hypoxia: Secondary | ICD-10-CM | POA: Diagnosis not present

## 2020-05-05 DIAGNOSIS — J1289 Other viral pneumonia: Secondary | ICD-10-CM | POA: Diagnosis not present

## 2020-05-05 DIAGNOSIS — U071 COVID-19: Secondary | ICD-10-CM | POA: Diagnosis not present

## 2020-05-22 ENCOUNTER — Other Ambulatory Visit: Payer: Self-pay

## 2020-05-22 ENCOUNTER — Ambulatory Visit: Payer: Medicare HMO | Admitting: Podiatry

## 2020-05-22 DIAGNOSIS — S91209A Unspecified open wound of unspecified toe(s) with damage to nail, initial encounter: Secondary | ICD-10-CM | POA: Diagnosis not present

## 2020-05-22 DIAGNOSIS — M79675 Pain in left toe(s): Secondary | ICD-10-CM

## 2020-05-22 NOTE — Progress Notes (Signed)
  Subjective:  Patient ID: [name], female    DOB: [birth year],  MRN: [id number]  Chief Complaint  Patient presents with  . Ingrown Toenail    2wk F/u- pt states she is doing well, slowly healing     74 y.o. female presents for follow up of nail procedure. History confirmed with patient. Doing well no longer throbbing or hurting.  Objective:  Physical Exam: Ingrown nail avulsion site: overlying soft crust, no warmth, no drainage and no erythema Assessment:   1. Avulsion of toenail, initial encounter   2. Pain in toe of left foot      Plan:  Patient was evaluated and treated and all questions answered.  S/p Ingrown Toenail Excision, left -Healing well without issue. -No need to soak or apply medications -Nail will grow in ~6 months -Discussed return precautions. -F/u PRN

## 2020-05-22 NOTE — Progress Notes (Signed)
  Subjective:  Patient ID: [name], female    DOB: July 21, 1946,  MRN: [id number]  Chief Complaint  Patient presents with  . Nail Problem    Left 1st toenail injury 2 days duration, toenail was stepped on and nail has become loose/bleeding.   74 y.o. female presents with the above complaint. History confirmed with patient.   Objective:  Physical Exam: warm, delayed capillary refill, no trophic changes or ulcerative lesions, faint DP and PT pulses and normal sensory exam.  Painful loose nail at left hallux with redness of the eponychia  Assessment:   1. Avulsion of toenail, initial encounter   2. Pain in toe of left foot   3. Cellulitis and abscess of toe of left foot   4. Paronychia of great toe of left foot      Plan:  Patient was evaluated and treated and all questions answered.  Paronychia, left -Patient elects to proceed with ingrown toenail removal today -Educated on post-procedure care including soaking. Written instructions provided. -Patient to follow up in 2 weeks for nail check. -Rx keflex  Procedure: Avulsion of toenail Location: Left 1st toe  Anesthesia: Lidocaine 1% plain; 1.5 mL and Marcaine 0.5% plain; 1.5 mL, digital block. Skin Prep: Betadine. Dressing: Silvadene; telfa; dry, sterile, compression dressing. Technique: Following skin prep, the nail was freed and avulsed with a hemostat. The area was cleansed. No tourniquet was used due to the concern of vascularity. Disposition: Patient tolerated procedure well.

## 2020-05-29 ENCOUNTER — Other Ambulatory Visit: Payer: Self-pay | Admitting: Rheumatology

## 2020-05-29 DIAGNOSIS — M47894 Other spondylosis, thoracic region: Secondary | ICD-10-CM | POA: Diagnosis not present

## 2020-05-29 DIAGNOSIS — Z79899 Other long term (current) drug therapy: Secondary | ICD-10-CM | POA: Diagnosis not present

## 2020-05-29 DIAGNOSIS — M5030 Other cervical disc degeneration, unspecified cervical region: Secondary | ICD-10-CM | POA: Diagnosis not present

## 2020-05-29 DIAGNOSIS — M0609 Rheumatoid arthritis without rheumatoid factor, multiple sites: Secondary | ICD-10-CM

## 2020-05-29 DIAGNOSIS — M069 Rheumatoid arthritis, unspecified: Secondary | ICD-10-CM | POA: Diagnosis not present

## 2020-05-29 NOTE — Telephone Encounter (Signed)
Last Visit: 04/08/2020 Next Visit: 09/09/2020 Labs: 03/02/2020 CBC and CMP are normal. Glucose is mildly elevated probably not fasting Eye exam: 03/02/2020 WNL   Current Dose per office note 04/08/2020: PLQ 200 mg 1 tablet by mouth twice daily DX: Rheumatoid arthritis of multiple sites with negative rheumatoid factor   Okay to refill per Dr. Estanislado Pandy

## 2020-06-05 DIAGNOSIS — J9621 Acute and chronic respiratory failure with hypoxia: Secondary | ICD-10-CM | POA: Diagnosis not present

## 2020-06-05 DIAGNOSIS — J1289 Other viral pneumonia: Secondary | ICD-10-CM | POA: Diagnosis not present

## 2020-06-05 DIAGNOSIS — R0602 Shortness of breath: Secondary | ICD-10-CM | POA: Diagnosis not present

## 2020-06-05 DIAGNOSIS — U071 COVID-19: Secondary | ICD-10-CM | POA: Diagnosis not present

## 2020-06-11 DIAGNOSIS — C50911 Malignant neoplasm of unspecified site of right female breast: Secondary | ICD-10-CM | POA: Diagnosis not present

## 2020-06-15 ENCOUNTER — Other Ambulatory Visit: Payer: Self-pay | Admitting: *Deleted

## 2020-06-15 DIAGNOSIS — I739 Peripheral vascular disease, unspecified: Secondary | ICD-10-CM

## 2020-06-26 ENCOUNTER — Encounter: Payer: Self-pay | Admitting: Vascular Surgery

## 2020-06-26 ENCOUNTER — Ambulatory Visit (HOSPITAL_COMMUNITY)
Admission: RE | Admit: 2020-06-26 | Discharge: 2020-06-26 | Disposition: A | Discharge status: Home / Self Care | Payer: Medicare HMO | Source: Ambulatory Visit | Attending: Vascular Surgery | Admitting: Vascular Surgery

## 2020-06-26 ENCOUNTER — Ambulatory Visit: Payer: Medicare HMO | Admitting: Vascular Surgery

## 2020-06-26 ENCOUNTER — Other Ambulatory Visit: Payer: Self-pay

## 2020-06-26 VITALS — BP 147/87 | HR 55 | Temp 98.3°F | Resp 20 | Ht 64.0 in | Wt 244.9 lb

## 2020-06-26 DIAGNOSIS — I739 Peripheral vascular disease, unspecified: Secondary | ICD-10-CM | POA: Diagnosis not present

## 2020-06-26 DIAGNOSIS — R600 Localized edema: Secondary | ICD-10-CM

## 2020-06-26 NOTE — Progress Notes (Signed)
Patient ID: [name], female   DOB: July 29, 1945, 74 y.o.   MRN: [id number]  Reason for Consult: New Patient (Initial Visit)   Referred by Crist Infante, MD  Subjective:     HPI:  [name] is a 74 y.o. female has intermittent wounds of her left lower extremity.  She has been seen in our office before offered possible [place] boot therapy.  She does wear intermittent compression but is very uncomfortable and difficult for her place.  She does have previous ablation of her greater saphenous lesser saphenous veins bilaterally at [place] [facility].  She does wish that she did not have this.  She has swelling in her left greater than right lower extremity.  Previous bilateral knee replacements previous trauma to the left foot.  Currently has a wound that is healing.  She walks with the help of a cane.  Past Medical History:  Diagnosis Date  . Actinic keratosis   . Asthma    Albuterol inhaler as needed.Pulmicort neb as needed  . Asthma   . Breast cancer ([place])    right - lumpectomy   . Chronic back pain    spinal stenosis  . Coronary artery disease   . Dependence on nocturnal oxygen therapy 07/10/2017   Pt uses 2 liters 02 at night   . Depression    takes CYmbalta and Wellbutrin daily  . Diabetes mellitus    not on any meds/controlled by diet  . Dyspnea    with exertion occasionally when lies down  . GERD (gastroesophageal reflux disease)    takes Omeprazole daily  . Headache    oocasionally  . History of kidney stones   . Hyperlipidemia    not on any meds  . Hypertension    takes Benazepril,Bystolic,and Amlodipine daily  . IBS (irritable bowel syndrome)   . Joint pain   . Joint swelling   . Livedoid vasculitis   . Nocturia   . OA (osteoarthritis) of knee   . OSA (obstructive sleep apnea)   . Other seborrheic keratosis   . Oxygen deficiency    2 liters at night per pt for OSA- no cpap   . Peripheral edema    takes daily as needed  . Peripheral neuropathy   . Personal  history of chemotherapy   . Personal history of radiation therapy   . Pneumonia    hx of-several yrs ago  . RA (rheumatoid arthritis) ([place])   . Sleep apnea    pt states she uses oxygen at night - no cpap- uses 02 2 liters at night   . Urinary frequency   . Urinary urgency   . Weakness    numbness and tingling mainly in left leg occasionally in right.Tingling/numbness in hands   Family History  Problem Relation Age of Onset  . Heart disease Mother   . Cancer Father   . Cancer Brother   . Heart disease Brother    Past Surgical History:  Procedure Laterality Date  . BREAST LUMPECTOMY  1997   RIGHT BREAST  . CARDIAC CATHETERIZATION  04/23/2004   EF 60%  . cataract surgery Bilateral   . COLONOSCOPY    . COLONOSCOPY WITH PROPOFOL N/A 08/07/2017   Procedure: COLONOSCOPY WITH PROPOFOL;  Surgeon: Ladene Artist, MD;  Location: [facility];  Service: Endoscopy;  Laterality: N/A;  . CORONARY ARTERY BYPASS GRAFT  2005   LIMA GRAFT TO LAD, SAPHENOUS VEIN GRAFT TO THE FIRST DIAGONAL, AND LEFT RADIAL ARTERY  GRAFT TO THE OM  . ESOPHAGOGASTRODUODENOSCOPY (EGD) WITH PROPOFOL N/A 08/07/2017   Procedure: ESOPHAGOGASTRODUODENOSCOPY (EGD) WITH PROPOFOL;  Surgeon: Meryl Dare, MD;  Location: [facility];  Service: Endoscopy;  Laterality: N/A;  . FOOT SURGERY Right   . JOINT REPLACEMENT    . LITHOTRIPSY    . LUMBAR LAMINECTOMY/DECOMPRESSION MICRODISCECTOMY N/A 09/08/2015   Procedure: CENTRAL DECOMPRESSIVE LUMBAR LAMINECTOMIES L3-4, L4-5 AND BILATERAL HEMILAMINECTOMY L5-S1;  Surgeon: Kerrin Champagne, MD;  Location: [facility] OR;  Service: Orthopedics;  Laterality: N/A;  . nodule removed from left elbow    . SHOULDER ARTHROSCOPY    . TOTAL KNEE ARTHROPLASTY Bilateral   . TOTAL SHOULDER ARTHROPLASTY    . TRIPLE BYPASS  04/27/05  . US ECHOCARDIOGRAPHY  01/06/2009   EF 55-60%  . WRIST SURGERY Left     Short Social History:  Social History   Tobacco Use  . Smoking status: Never Smoker  . Smokeless  tobacco: Never Used  Substance Use Topics  . Alcohol use: No    Alcohol/week: 0.0 standard drinks    Allergies  Allergen Reactions  . Statins Other (See Comments)    Joint pain and swelling/burning  . Diazepam   . Fentanyl   . Morphine And Related Other (See Comments)    hallucinations  . Other     Current Outpatient Medications  Medication Sig Dispense Refill  . ACCU-CHEK GUIDE test strip     . albuterol (VENTOLIN HFA) 108 (90 Base) MCG/ACT inhaler Inhale 2 puffs into the lungs every 4 (four) hours as needed for wheezing or shortness of breath (cough, shortness of breath or wheezing.). 6.7 g 0  . amLODipine (NORVASC) 5 MG tablet amlodipine 5 mg tablet    . aspirin 81 MG tablet Take 81 mg by mouth at bedtime.     . baclofen (LIORESAL) 10 MG tablet TAKE 1 TABLET BY MOUTH TWICE A DAY AS NEEDED FOR SPASMS (Patient taking differently: Take 10 mg by mouth 2 (two) times daily. ) 180 tablet 0  . blood glucose meter kit and supplies Dispense based on patient and insurance preference. Use up to four times daily as directed. (FOR ICD-10 E10.9, E11.9). 1 each 0  . buPROPion (WELLBUTRIN XL) 150 MG 24 hr tablet Take 150 mg by mouth daily.     . busPIRone (BUSPAR) 7.5 MG tablet Take 7.5 mg by mouth 2 (two) times daily.     . Calcium Carb-Cholecalciferol (CALCIUM 1000 + D PO) Take by mouth daily.    . cholecalciferol (VITAMIN D3) 25 MCG (1000 UT) tablet Take 1,000 Units by mouth daily.    . clobetasol cream (TEMOVATE) 0.05 % clobetasol 0.05 % topical cream  APPLY TO AFFECTED AREA UP TO TWICE DAILY FOR ECZEMA OR ITCHING    . Cyanocobalamin (VITAMIN B-12 PO) Take by mouth daily.    Marland Kitchen dexamethasone (DECADRON) 6 MG tablet dexamethasone 6 mg tablet  TAKE 1 TABLET BY MOUTH EVERY DAY FOR COPD EXACERBATION    . doxycycline (VIBRAMYCIN) 100 MG capsule     . DULoxetine (CYMBALTA) 60 MG capsule Take 60 mg by mouth daily.    . Echinacea 400 MG CAPS Take by mouth daily.    Marland Kitchen ezetimibe (ZETIA) 10 MG tablet  Take 1 tablet (10 mg total) by mouth daily. 30 tablet 0  . fluticasone (FLONASE) 50 MCG/ACT nasal spray Place 2 sprays into both nostrils 2 (two) times a day.    . gabapentin (NEURONTIN) 300 MG capsule Take 600 mg by mouth  3 (three) times daily.     Marland Kitchen galantamine (RAZADYNE ER) 16 MG 24 hr capsule Take 16 mg by mouth daily with breakfast.   11  . HYDROcodone-acetaminophen (NORCO/VICODIN) 5-325 MG tablet every 6 (six) hours as needed.    . hydroxychloroquine (PLAQUENIL) 200 MG tablet TAKE 1 TABLET TWICE DAILY FOR RHEUMATOID ARTHRITIS 180 tablet 0  . meloxicam (MOBIC) 15 MG tablet Take 15 mg by mouth 2 (two) times a week.     . montelukast (SINGULAIR) 10 MG tablet Take 10 mg by mouth at bedtime.    . Multiple Vitamins-Minerals (HAIR SKIN NAILS PO) Take by mouth daily.    . Multiple Vitamins-Minerals (ZINC PO) Take 1 tablet by mouth daily.    . mupirocin ointment (BACTROBAN) 2 % as needed.    . nebivolol (BYSTOLIC) 10 MG tablet Take 10 mg by mouth daily.      . nitroGLYCERIN (NITROSTAT) 0.4 MG SL tablet nitroglycerin 0.4 mg sublingual tablet    . olmesartan-hydrochlorothiazide (BENICAR HCT) 40-25 MG tablet Take 1 tablet by mouth daily.   5  . omeprazole (PRILOSEC) 20 MG capsule Take 20 mg by mouth daily.    Marland Kitchen OVER THE COUNTER MEDICATION Swedish bitters    . Potassium 99 MG TABS Take by mouth daily.    . SUPER B COMPLEX/C PO Take by mouth daily.    . betamethasone dipropionate (DIPROLENE) 0.05 % ointment Apply 1 application topically daily.  (Patient not taking: Reported on 06/26/2020)    . clotrimazole (LOTRIMIN) 1 % cream Apply 1 application topically as needed. (Patient not taking: Reported on 06/26/2020)    . folic acid (FOLVITE) 1 MG tablet Take 2 tablets (2 mg total) by mouth daily. 180 tablet 4   No current facility-administered medications for this visit.    Review of Systems  Constitutional:  Constitutional negative. HENT: HENT negative.  Eyes: Eyes negative.  Respiratory: Respiratory  negative.  Cardiovascular: Positive for leg swelling.  GI: Gastrointestinal negative.  Musculoskeletal: Musculoskeletal negative.  Skin: Skin negative.  Neurological: Neurological negative. Hematologic: Hematologic/lymphatic negative.  Psychiatric: Psychiatric negative.        Objective:  Objective   Vitals:   06/26/20 1047  BP: (!) 147/87  Pulse: (!) 55  Resp: 20  Temp: 98.3 F (36.8 C)  SpO2: 95%  Weight: 244 lb 14.4 oz (111.1 kg)  Height: _0  (1.626 m)   Body mass index is 42.04 kg/m.  Physical Exam HENT:     Head: Normocephalic.     Nose:     Comments: Wearing a mask Eyes:     Extraocular Movements: Extraocular movements intact.     Pupils: Pupils are equal, round, and reactive to light.  Cardiovascular:     Rate and Rhythm: Normal rate.     Pulses:          Posterior tibial pulses are 2+ on the right side and 2+ on the left side.  Abdominal:     General: Abdomen is flat.     Palpations: Abdomen is soft.  Musculoskeletal:     Right lower leg: Edema present.     Left lower leg: Edema present.  Skin:    Capillary Refill: Capillary refill takes less than 2 seconds.  Neurological:     General: No focal deficit present.     Mental Status: She is alert.  Psychiatric:        Mood and Affect: Mood normal.        Behavior: Behavior normal.  Thought Content: Thought content normal.        Judgment: Judgment normal.     Data: ABI Findings:  +---------+------------------+-----+----------+--------+  Right  Rt Pressure (mmHg)IndexWaveform Comment   +---------+------------------+-----+----------+--------+  Brachial 168                      +---------+------------------+-----+----------+--------+  PTA   184        1.10 triphasic       +---------+------------------+-----+----------+--------+  DP    171        1.02 monophasic        +---------+------------------+-----+----------+--------+  Great Toe102        0.61            +---------+------------------+-----+----------+--------+   +---------+------------------+-----+--------+-------+  Left   Lt Pressure (mmHg)IndexWaveformComment  +---------+------------------+-----+--------+-------+  Brachial 168                     +---------+------------------+-----+--------+-------+  PTA   244        1.45 biphasic      +---------+------------------+-----+--------+-------+  DP    238        1.42 biphasic      +---------+------------------+-----+--------+-------+  Great Toe106        0.63           +---------+------------------+-----+--------+-------+   +-------+-----------+-----------+------------+------------+  ABI/TBIToday's ABIToday's TBIPrevious ABIPrevious TBI  +-------+-----------+-----------+------------+------------+  Right 1.02    0.61                  +-------+-----------+-----------+------------+------------+  Left  1.45    0.63                  +-------+-----------+-----------+------------+------------+         Assessment/Plan:    74 year old female with previous lesser and greater saphenous vein ablations.  She does have some redness to her left leg has been treated with antibiotics.  She intermittently wears compression due to discomfort and inability to place them.  She has normal ABIs and palpable posterior tibial pulses it is difficult to palpate dorsalis pedis pulses given the edema.  No vascular invention is necessary at this time and she can follow-up on an as-needed basis.      Waynetta Sandy MD [facility]

## 2020-06-30 DIAGNOSIS — M069 Rheumatoid arthritis, unspecified: Secondary | ICD-10-CM | POA: Diagnosis not present

## 2020-06-30 DIAGNOSIS — M5030 Other cervical disc degeneration, unspecified cervical region: Secondary | ICD-10-CM | POA: Diagnosis not present

## 2020-06-30 DIAGNOSIS — M47816 Spondylosis without myelopathy or radiculopathy, lumbar region: Secondary | ICD-10-CM | POA: Diagnosis not present

## 2020-06-30 DIAGNOSIS — Z79899 Other long term (current) drug therapy: Secondary | ICD-10-CM | POA: Diagnosis not present

## 2020-06-30 DIAGNOSIS — M47812 Spondylosis without myelopathy or radiculopathy, cervical region: Secondary | ICD-10-CM | POA: Diagnosis not present

## 2020-07-03 ENCOUNTER — Ambulatory Visit (INDEPENDENT_AMBULATORY_CARE_PROVIDER_SITE_OTHER): Payer: Medicare HMO

## 2020-07-03 ENCOUNTER — Ambulatory Visit: Payer: Medicare HMO | Admitting: Podiatry

## 2020-07-03 ENCOUNTER — Other Ambulatory Visit: Payer: Self-pay | Admitting: Podiatry

## 2020-07-03 ENCOUNTER — Other Ambulatory Visit: Payer: Self-pay

## 2020-07-03 DIAGNOSIS — I998 Other disorder of circulatory system: Secondary | ICD-10-CM | POA: Diagnosis not present

## 2020-07-03 DIAGNOSIS — M722 Plantar fascial fibromatosis: Secondary | ICD-10-CM | POA: Diagnosis not present

## 2020-07-03 DIAGNOSIS — I739 Peripheral vascular disease, unspecified: Secondary | ICD-10-CM

## 2020-07-03 DIAGNOSIS — M79671 Pain in right foot: Secondary | ICD-10-CM

## 2020-07-03 MED ORDER — BETAMETHASONE SOD PHOS & ACET 6 (3-3) MG/ML IJ SUSP: 6.0000 mg | Freq: Once | INTRAMUSCULAR | Status: DC

## 2020-07-03 NOTE — Progress Notes (Signed)
  Subjective:  Patient ID: [name], female    DOB: 1946/05/16,  MRN: [id number]  Chief Complaint  Patient presents with  . Foot Pain    Pt states right plantar/medial foot pain. No known injuries. Pt states history of childhood foot injections. Pt states it is very painful recently.    74 y.o. female presents with the above complaint. History confirmed with patient.   Objective:  Physical Exam: warm, good capillary refill, no trophic changes or ulcerative lesions, normal DP and PT pulses and normal sensory exam. Right Foot: tenderness to palpation medial calcaneal tuber, no pain with calcaneal squeeze, decreased ankle joint ROM and +Silverskiold test. Firm calcification of the Achilles tendon and surrounding muscle.  Radiographs: X-ray of the right foot: no evidence of calcaneal stress fracture and significant posterior calcifications  Assessment:   1. Plantar fasciitis   2. Pain of right heel   3. Vascular calcification   4. PVD (peripheral vascular disease) ([place])      Plan:  Patient was evaluated and treated and all questions answered.  Plantar Fasciitis -XR reviewed with patient -I am unsure given calcification posteriorly how much stretching can reasonably be accomplished. -Plantar fascial brace dispensed -Injection delivered to the plantar fascia of the right foot.  Procedure: Injection Tendon/Ligament Consent: Verbal consent obtained. Location: Right plantar fascia at the glabrous junction; medial approach. Skin Prep: Alcohol. Injectate: 1 cc 0.5% marcaine plain, 1 cc celestone/ Disposition: Patient tolerated procedure well. Injection site dressed with a band-aid.  PVD Bilat, Vascular calcifications leg -Educated on XR findings. -Continue care with general surgery for left leg wound. She likely needs compression therapy as well   No follow-ups on file.

## 2020-07-05 DIAGNOSIS — R0602 Shortness of breath: Secondary | ICD-10-CM | POA: Diagnosis not present

## 2020-07-05 DIAGNOSIS — J1289 Other viral pneumonia: Secondary | ICD-10-CM | POA: Diagnosis not present

## 2020-07-05 DIAGNOSIS — J9621 Acute and chronic respiratory failure with hypoxia: Secondary | ICD-10-CM | POA: Diagnosis not present

## 2020-07-05 DIAGNOSIS — U071 COVID-19: Secondary | ICD-10-CM | POA: Diagnosis not present

## 2020-07-06 ENCOUNTER — Other Ambulatory Visit: Payer: Self-pay | Admitting: Rheumatology

## 2020-07-06 DIAGNOSIS — M0609 Rheumatoid arthritis without rheumatoid factor, multiple sites: Secondary | ICD-10-CM

## 2020-07-22 DIAGNOSIS — J069 Acute upper respiratory infection, unspecified: Secondary | ICD-10-CM | POA: Diagnosis not present

## 2020-07-22 DIAGNOSIS — R059 Cough, unspecified: Secondary | ICD-10-CM | POA: Diagnosis not present

## 2020-07-22 DIAGNOSIS — Z1152 Encounter for screening for COVID-19: Secondary | ICD-10-CM | POA: Diagnosis not present

## 2020-07-22 DIAGNOSIS — I10 Essential (primary) hypertension: Secondary | ICD-10-CM | POA: Diagnosis not present

## 2020-07-22 DIAGNOSIS — L0889 Other specified local infections of the skin and subcutaneous tissue: Secondary | ICD-10-CM | POA: Diagnosis not present

## 2020-07-22 DIAGNOSIS — J3489 Other specified disorders of nose and nasal sinuses: Secondary | ICD-10-CM | POA: Diagnosis not present

## 2020-07-30 DIAGNOSIS — M069 Rheumatoid arthritis, unspecified: Secondary | ICD-10-CM | POA: Diagnosis not present

## 2020-07-30 DIAGNOSIS — Z79899 Other long term (current) drug therapy: Secondary | ICD-10-CM | POA: Diagnosis not present

## 2020-07-30 DIAGNOSIS — M47812 Spondylosis without myelopathy or radiculopathy, cervical region: Secondary | ICD-10-CM | POA: Diagnosis not present

## 2020-07-30 DIAGNOSIS — Z96653 Presence of artificial knee joint, bilateral: Secondary | ICD-10-CM | POA: Diagnosis not present

## 2020-07-30 DIAGNOSIS — M5030 Other cervical disc degeneration, unspecified cervical region: Secondary | ICD-10-CM | POA: Diagnosis not present

## 2020-08-04 ENCOUNTER — Ambulatory Visit: Payer: Medicare HMO | Admitting: Podiatry

## 2020-08-05 DIAGNOSIS — J1289 Other viral pneumonia: Secondary | ICD-10-CM | POA: Diagnosis not present

## 2020-08-05 DIAGNOSIS — U071 COVID-19: Secondary | ICD-10-CM | POA: Diagnosis not present

## 2020-08-05 DIAGNOSIS — R0602 Shortness of breath: Secondary | ICD-10-CM | POA: Diagnosis not present

## 2020-08-05 DIAGNOSIS — J9621 Acute and chronic respiratory failure with hypoxia: Secondary | ICD-10-CM | POA: Diagnosis not present

## 2020-08-06 DIAGNOSIS — Z6841 Body Mass Index (BMI) 40.0 and over, adult: Secondary | ICD-10-CM | POA: Diagnosis not present

## 2020-08-06 DIAGNOSIS — Z124 Encounter for screening for malignant neoplasm of cervix: Secondary | ICD-10-CM | POA: Diagnosis not present

## 2020-08-18 DIAGNOSIS — F329 Major depressive disorder, single episode, unspecified: Secondary | ICD-10-CM | POA: Diagnosis not present

## 2020-08-18 DIAGNOSIS — D8989 Other specified disorders involving the immune mechanism, not elsewhere classified: Secondary | ICD-10-CM | POA: Diagnosis not present

## 2020-08-18 DIAGNOSIS — B370 Candidal stomatitis: Secondary | ICD-10-CM | POA: Diagnosis not present

## 2020-08-18 DIAGNOSIS — E785 Hyperlipidemia, unspecified: Secondary | ICD-10-CM | POA: Diagnosis not present

## 2020-08-18 DIAGNOSIS — E1151 Type 2 diabetes mellitus with diabetic peripheral angiopathy without gangrene: Secondary | ICD-10-CM | POA: Diagnosis not present

## 2020-08-18 DIAGNOSIS — I10 Essential (primary) hypertension: Secondary | ICD-10-CM | POA: Diagnosis not present

## 2020-08-18 DIAGNOSIS — I2510 Atherosclerotic heart disease of native coronary artery without angina pectoris: Secondary | ICD-10-CM | POA: Diagnosis not present

## 2020-08-18 DIAGNOSIS — G629 Polyneuropathy, unspecified: Secondary | ICD-10-CM | POA: Diagnosis not present

## 2020-08-18 DIAGNOSIS — G4733 Obstructive sleep apnea (adult) (pediatric): Secondary | ICD-10-CM | POA: Diagnosis not present

## 2020-08-26 NOTE — Progress Notes (Signed)
Office Visit Note  Patient: [name]             Date of Birth: 11/10/1945           MRN: [id number]             PCP: Crist Infante, MD Referring: Crist Infante, MD Visit Date: 09/09/2020 Occupation: @GUAROCC @  Subjective:  Pain in both hands   History of Present Illness: [name] is a 75 y.o. female with history of seronegative rheumatoid arthritis and DDD.  She is taking plaquenil 200 mg 1 tablet by mouth twice daily. She is tolerating PLQ without any side effects.  She continues to have chronic pain in multiple joints including the left shoulder, both hands, neck, and lower back.  She states she notices swelling in her hands on a weekly basis.  She has not been evaluated by Dr. Louanne Skye recently but she purchased CBD oil as recommended to try for pain relief. She reports both knee replacements are doing well.  She has been using her cane or walker to assist with ambulation. She has ongoing pedal edema bilaterally and wears compression stockings every other day. She has a persistent non-healing ulcer on the left LE.  She had a recent appointment with vascular surgery who did not recommend intervention at that time.  She will be following up with her dermatologist soon.  She does not want a referral to wound care at this time.      Activities of Daily Living:  Patient reports morning stiffness for 2 hours.   Patient Reports nocturnal pain.  Difficulty dressing/grooming: Reports Difficulty climbing stairs: Reports Difficulty getting out of chair: Reports Difficulty using hands for taps, buttons, cutlery, and/or writing: Reports  Review of Systems  Constitutional: Positive for fatigue.  HENT: Positive for mouth dryness. Negative for mouth sores and nose dryness.   Eyes: Negative for pain, visual disturbance and dryness.  Respiratory: Negative for cough, hemoptysis and difficulty breathing.   Cardiovascular: Positive for swelling in legs/feet. Negative for chest pain,  palpitations and hypertension.  Gastrointestinal: Positive for diarrhea. Negative for blood in stool and constipation.  Endocrine: Negative for increased urination.  Genitourinary: Negative for difficulty urinating and painful urination.  Musculoskeletal: Positive for arthralgias, gait problem, joint pain, joint swelling, morning stiffness and muscle tenderness. Negative for myalgias, muscle weakness and myalgias.  Skin: Negative for color change, pallor, rash, hair loss, nodules/bumps, skin tightness, ulcers and sensitivity to sunlight.  Allergic/Immunologic: Positive for susceptible to infections.  Neurological: Positive for numbness. Negative for dizziness and headaches.  Hematological: Negative for bruising/bleeding tendency and swollen glands.  Psychiatric/Behavioral: Negative for depressed mood and sleep disturbance. The patient is not nervous/anxious.     PMFS History:  Patient Active Problem List   Diagnosis Date Noted  . Acute on chronic respiratory failure with hypoxia ([place]) 02/28/2019  . OSA on CPAP 02/28/2019  . Diabetes mellitus type 2, uncontrolled, with complications ([place]) [id number]  . Breast cancer ([place]) 02/27/2019  . History of cancer chemotherapy 02/27/2019  . History of radiation therapy 02/27/2019  . Diabetic neuropathy ([place]) 02/27/2019  . HLD (hyperlipidemia) 02/27/2019  . Essential hypertension 02/27/2019  . QT prolongation 02/27/2019  . Pneumonia due to COVID-19 virus 02/24/2019  . Diarrhea   . Benign neoplasm of cecum   . Abdominal pain, epigastric   . Gastroesophageal reflux disease   . Dependence on nocturnal oxygen therapy 07/10/2017  . Open wound of left lower leg 12/26/2016  . Vitamin D  deficiency 09/14/2016  . History of total knee replacement, bilateral 09/14/2016  . DJD (degenerative joint disease), cervical 09/14/2016  . 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 High risk medication use 05/20/2016  . Spinal  stenosis, lumbar region, with neurogenic claudication 09/08/2015    Class: Chronic  . OSA (obstructive sleep apnea) 10/17/2013  . Dyspnea 09/04/2013  . Atypical chest pain 09/04/2013  . Morbid obesity ([place]) 03/21/2012  . Cancer of lower-outer quadrant of female breast ([place]) 03/15/2012  . Diabetes mellitus, type II ([place]) 02/22/2011  . Coronary artery disease   . Hypertension   . Hyperlipidemia   . OA (osteoarthritis) of knee     Past Medical History:  Diagnosis Date  . Actinic keratosis   . Asthma    Albuterol inhaler as needed.Pulmicort neb as needed  . Asthma   . Breast cancer ([place])    right - lumpectomy   . Chronic back pain    spinal stenosis  . Coronary artery disease   . Dependence on nocturnal oxygen therapy 07/10/2017   Pt uses 2 liters 02 at night   . Depression    takes CYmbalta and Wellbutrin daily  . Diabetes mellitus    not on any meds/controlled by diet  . Dyspnea    with exertion occasionally when lies down  . GERD (gastroesophageal reflux disease)    takes Omeprazole daily  . Headache    oocasionally  . History of kidney stones   . Hyperlipidemia    not on any meds  . Hypertension    takes Benazepril,Bystolic,and Amlodipine daily  . IBS (irritable bowel syndrome)   . Joint pain   . Joint swelling   . Livedoid vasculitis   . Nocturia   . OA (osteoarthritis) of knee   . OSA (obstructive sleep apnea)   . Other seborrheic keratosis   . Oxygen deficiency    2 liters at night per pt for OSA- no cpap   . Peripheral edema    takes daily as needed  . Peripheral neuropathy   . Personal history of chemotherapy   . Personal history of radiation therapy   . Pneumonia    hx of-several yrs ago  . RA (rheumatoid arthritis) ([place])   . Sleep apnea    pt states she uses oxygen at night - no cpap- uses 02 2 liters at night   . Urinary frequency   . Urinary urgency   . Weakness    numbness and tingling mainly in left leg occasionally in right.Tingling/numbness  in hands    Family History  Problem Relation Age of Onset  . Heart disease Mother   . Cancer Father   . Cancer Brother   . Heart disease Brother    Past Surgical History:  Procedure Laterality Date  . BREAST LUMPECTOMY  1997   RIGHT BREAST  . CARDIAC CATHETERIZATION  04/23/2004   EF 60%  . cataract surgery Bilateral   . COLONOSCOPY    . COLONOSCOPY WITH PROPOFOL N/A 08/07/2017   Procedure: COLONOSCOPY WITH PROPOFOL;  Surgeon: Ladene Artist, MD;  Location: [facility];  Service: Endoscopy;  Laterality: N/A;  . CORONARY ARTERY BYPASS GRAFT  2005   LIMA GRAFT TO LAD, SAPHENOUS VEIN GRAFT TO THE FIRST DIAGONAL, AND LEFT RADIAL ARTERY GRAFT TO THE OM  . ESOPHAGOGASTRODUODENOSCOPY (EGD) WITH PROPOFOL N/A 08/07/2017   Procedure: ESOPHAGOGASTRODUODENOSCOPY (EGD) WITH PROPOFOL;  Surgeon: Ladene Artist, MD;  Location: [facility];  Service: Endoscopy;  Laterality: N/A;  . FOOT SURGERY Right   . JOINT REPLACEMENT    . LITHOTRIPSY    . LUMBAR LAMINECTOMY/DECOMPRESSION MICRODISCECTOMY N/A 09/08/2015   Procedure: CENTRAL DECOMPRESSIVE LUMBAR LAMINECTOMIES L3-4, L4-5 AND BILATERAL HEMILAMINECTOMY L5-S1;  Surgeon: Jessy Oto, MD;  Location: [facility];  Service: Orthopedics;  Laterality: N/A;  . nodule removed from left elbow    . SHOULDER ARTHROSCOPY    . TOTAL KNEE ARTHROPLASTY Bilateral   . TOTAL SHOULDER ARTHROPLASTY    . TRIPLE BYPASS  04/27/05  . US ECHOCARDIOGRAPHY  01/06/2009   EF 55-60%  . WRIST SURGERY Left    Social History   Social History Narrative  . Not on file   Immunization History  Administered Date(s) Administered  . Influenza Split 04/24/2013  . PFIZER(Purple Top)SARS-COV-2 Vaccination 09/01/2019, 09/25/2019, 04/04/2020  . Zoster Recombinat (Shingrix) 10/11/2017, 12/22/2017     Objective: Vital Signs: BP (!) 162/77 (BP Location: Left Arm, Patient Position: Sitting, Cuff Size: Normal)   Pulse 63   Resp 17   Ht 5\' 4"  (1.626 m)   Wt 245 lb 3.2 oz (111.2 kg)    BMI 42.09 kg/m    Physical Exam Vitals and nursing note reviewed.  Constitutional:      Appearance: She is well-developed and well-nourished.  HENT:     Head: Normocephalic and atraumatic.  Eyes:     Extraocular Movements: EOM normal.     Conjunctiva/sclera: Conjunctivae normal.  Cardiovascular:     Pulses: Intact distal pulses.  Pulmonary:     Effort: Pulmonary effort is normal.  Abdominal:     Palpations: Abdomen is soft.  Musculoskeletal:     Cervical back: Normal range of motion.  Skin:    General: Skin is warm and dry.     Capillary Refill: Capillary refill takes less than 2 seconds.  Neurological:     Mental Status: She is alert and oriented to person, place, and time.  Psychiatric:        Mood and Affect: Mood and affect normal.        Behavior: Behavior normal.      Musculoskeletal Exam: C-spine limited ROM with lateral rotation.  Postural thoracic kyphosis. Painful ROM of lumbar spine.  Right shoulder limited abduction to about 90 degrees.  Painful ROM of lumbar spine.  Elbow joints, wrist joints, MCPs, PIPs, and DIPs good ROM with no synovitis.  Rheumatoid nodule on extensor surface of left elbow.  Ulnar deviation of MCPs.  PIP and DIP thickening consistent with osteoarthritis of both hands.  Complete fist formation bilaterally.  Bilateral knee replacements have good ROM.  Warmth of the right knee noted.  Pedal edema and erythema on both LEs noted.  Ulcer on left LE covered in bandage and compression sock.   CDAI Exam: CDAI Score: 1  Patient Global: 6 mm; Provider Global: 4 mm Swollen: 0 ; Tender: 0  Joint Exam 09/09/2020   No joint exam has been documented for this visit   There is currently no information documented on the homunculus. Go to the Rheumatology activity and complete the homunculus joint exam.  Investigation: No additional findings.  Imaging: No results found.  Recent Labs: Lab Results  Component Value Date   WBC 7.1 03/02/2020   HGB 13.1  03/02/2020   PLT 161 03/02/2020   NA 141 03/02/2020   K 4.2 03/02/2020   CL 102 03/02/2020   CO2 28 03/02/2020   GLUCOSE 134 (H) 03/02/2020   BUN 16 03/02/2020  CREATININE 0.58 (L) 03/02/2020   BILITOT 0.4 03/02/2020   ALKPHOS 36 (L) 03/07/2019   AST 19 03/02/2020   ALT 20 03/02/2020   PROT 6.3 03/02/2020   ALBUMIN 3.4 (L) 03/07/2019   CALCIUM 9.1 03/02/2020   GFRAA 104 03/02/2020    Speciality Comments: PLQ Eye Exam: 03/02/2020 WNL @ [facility] Follow up in 6 months.   Procedures:  No procedures performed Allergies: Statins, Alirocumab, Aricept [donepezil], Atorvastatin, Azithromycin, Crestor [rosuvastatin], Diazepam, Dry skin treatment [albolene], Evolocumab, Ezetimibe, Fenofibrate micronized, Fentanyl, Loratadine, Metformin, Morphine and related, Niaspan [niacin], Omeprazole, Other, Phentermine-topiramate, Welchol [colesevelam], and Zocor [simvastatin]   Assessment / Plan:     Visit Diagnoses: Rheumatoid arthritis of multiple sites with negative rheumatoid factor ([place]): She has no synovitis on exam.  She has ulnar deviation of all MCPs of both hands but no inflammation was noted. PIP and DIP thickening consistent with osteoarthritis of both hands.  She is able to make a complete fist bilaterally.  Rheumatoid nodule on extensor surface of left elbow noted. She continues to experience intermittent pain in the left shoulder joint but has good ROM on exam.  She is clinically doing well on plaquenil 200 mg 1 tablet by mouth twice daily.  She is tolerating PLQ without any side effects and has not missed any doses.  She was previously on MTX but discontinued in July 2019 due to having a non-healing ulcer on her left LE.  The ulcer remains despite management by dermatologist. She was evaluated by Dr. Donzetta Matters (vascular surgery) on 06/26/20 and no surgical intervention was recommended. Advised to continue compression stockings and to follow up with dermatology for further evaluation and  management.  She does not require more aggressive treatment at this time and is not an ideal candidate for furthrer immunosuppression due to the non-healing ulcers.  She was given a prescription for arthritis compression gloves to try wearing.  She was also given a handout of hand exercises to perform.  Discussed the importance of joint protection and muscle strengthening. She will remain on PLQ as prescribed.  She was advised to notify us if she develops increased joint pain or joint swelling.  She will follow up in 5 months.   High risk medication use - Plaquenil 200 mg 1 tablet by mouth twice daily.  (MTX was discontinued in July 2019 due to lower extremity ulcers).  CBC and CMP updated on 09/02/20.  She will continue to require lab work every 5 months to monitor for drug toxicity. PLQ Eye Exam: 03/02/2020 WNL @ [facility].    She has received 3 pfizer covid-19 vaccine doses.  She has had a non-healing ulcer on her left lower extremity for the past 3 years.  She has not been evaluated by wound care.  According to the patient she is wearing compression stockings every other day currently.  She is following up with her dermatologist every 6 months.   Ulcer of left lower extremity, limited to breakdown of skin (H[facility]) - Non-healing x3 years. Ulcer covered by bandage today.  She has significant pedal edema bilaterally.  She has been wearing compression stockings every other day.  She was recently evaluated by her vascular specialist.  She has not been evaluated at wound care.  She has been seeing her dermatologist every 6 months according to the patient.  Patient was strongly encouraged to make an appointment with wound care for further evaluation and management.   DDD (degenerative disc disease), cervical - She is  followed by Dr. Louanne Skye. Chronic pain. She has limited ROM with lateral rotation bilaterally.  No symptoms of radiculopathy.    DDD (degenerative disc disease), lumbar - X-rays of lumbar spine  on 08/19/19 consistent with multilevel spondylosis and DDD.  She is followed by Dr. Louanne Skye and Dr. Ernestina Patches. She has started using hemp CBD as recommended by Dr. Louanne Skye. She is not a surgical candidate. She continues to use a walker or cane to assist with ambulation.  She has started to try to walk more on a regular basis.  Discussed the importance of lower extremity strengthening and fall prevention.   History of total knee replacement, bilateral: Doing well.  She has good ROM of bilateral knee replacements on exam.  Warmth of the right knee was noted.  No effusion noted. She uses a cane or walker to assist with ambulation.   Varicose veins of bilateral lower extremities with other complications: Followed by Dr. Donzetta Matters (vascular surgery).  Most recent appointment on 06/26/20.  No vascular intervention necessary at this time.   Pedal edema: She wears compression stockings every other day.   Other medical conditions are listed as follows:   History of diabetes mellitus  History of hypertension  History of vitamin D deficiency  History of breast cancer  History of sleep apnea  History of coronary artery disease  Orders: No orders of the defined types were placed in this encounter.  No orders of the defined types were placed in this encounter.    Follow-Up Instructions: Return in about 5 months (around 02/06/2021) for Rheumatoid arthritis, Osteoarthritis.   Ofilia Neas, PA-C  Note - This record has been created using Dragon software.  Chart creation errors have been sought, but may not always  have been located. Such creation errors do not reflect on  the standard of medical care.

## 2020-09-02 DIAGNOSIS — M47816 Spondylosis without myelopathy or radiculopathy, lumbar region: Secondary | ICD-10-CM | POA: Diagnosis not present

## 2020-09-02 DIAGNOSIS — Z79899 Other long term (current) drug therapy: Secondary | ICD-10-CM | POA: Diagnosis not present

## 2020-09-02 DIAGNOSIS — M47894 Other spondylosis, thoracic region: Secondary | ICD-10-CM | POA: Diagnosis not present

## 2020-09-02 DIAGNOSIS — M069 Rheumatoid arthritis, unspecified: Secondary | ICD-10-CM | POA: Diagnosis not present

## 2020-09-02 DIAGNOSIS — M5030 Other cervical disc degeneration, unspecified cervical region: Secondary | ICD-10-CM | POA: Diagnosis not present

## 2020-09-05 DIAGNOSIS — U071 COVID-19: Secondary | ICD-10-CM | POA: Diagnosis not present

## 2020-09-05 DIAGNOSIS — J1289 Other viral pneumonia: Secondary | ICD-10-CM | POA: Diagnosis not present

## 2020-09-05 DIAGNOSIS — J9621 Acute and chronic respiratory failure with hypoxia: Secondary | ICD-10-CM | POA: Diagnosis not present

## 2020-09-05 DIAGNOSIS — R0602 Shortness of breath: Secondary | ICD-10-CM | POA: Diagnosis not present

## 2020-09-09 ENCOUNTER — Other Ambulatory Visit: Payer: Self-pay

## 2020-09-09 ENCOUNTER — Ambulatory Visit: Payer: Medicare HMO | Admitting: Physician Assistant

## 2020-09-09 ENCOUNTER — Encounter: Payer: Self-pay | Admitting: Physician Assistant

## 2020-09-09 VITALS — BP 162/77 | HR 63 | Resp 17 | Ht 64.0 in | Wt 245.2 lb

## 2020-09-09 DIAGNOSIS — Z79899 Other long term (current) drug therapy: Secondary | ICD-10-CM | POA: Diagnosis not present

## 2020-09-09 DIAGNOSIS — Z8669 Personal history of other diseases of the nervous system and sense organs: Secondary | ICD-10-CM

## 2020-09-09 DIAGNOSIS — Z8679 Personal history of other diseases of the circulatory system: Secondary | ICD-10-CM

## 2020-09-09 DIAGNOSIS — M5136 Other intervertebral disc degeneration, lumbar region: Secondary | ICD-10-CM | POA: Diagnosis not present

## 2020-09-09 DIAGNOSIS — M0609 Rheumatoid arthritis without rheumatoid factor, multiple sites: Secondary | ICD-10-CM

## 2020-09-09 DIAGNOSIS — L97921 Non-pressure chronic ulcer of unspecified part of left lower leg limited to breakdown of skin: Secondary | ICD-10-CM | POA: Diagnosis not present

## 2020-09-09 DIAGNOSIS — R600 Localized edema: Secondary | ICD-10-CM

## 2020-09-09 DIAGNOSIS — Z853 Personal history of malignant neoplasm of breast: Secondary | ICD-10-CM

## 2020-09-09 DIAGNOSIS — Z8639 Personal history of other endocrine, nutritional and metabolic disease: Secondary | ICD-10-CM

## 2020-09-09 DIAGNOSIS — Z96653 Presence of artificial knee joint, bilateral: Secondary | ICD-10-CM | POA: Diagnosis not present

## 2020-09-09 DIAGNOSIS — M5030 Other cervical disc degeneration, unspecified cervical region: Secondary | ICD-10-CM | POA: Diagnosis not present

## 2020-09-09 DIAGNOSIS — I83893 Varicose veins of bilateral lower extremities with other complications: Secondary | ICD-10-CM

## 2020-09-09 NOTE — Patient Instructions (Signed)
Hand Exercises Hand exercises can be helpful for almost anyone. These exercises can strengthen the hands, improve flexibility and movement, and increase blood flow to the hands. These results can make work and daily tasks easier. Hand exercises can be especially helpful for people who have joint pain from arthritis or have nerve damage from overuse (carpal tunnel syndrome). These exercises can also help people who have injured a hand. Exercises Most of these hand exercises are gentle stretching and motion exercises. It is usually safe to do them often throughout the day. Warming up your hands before exercise may help to reduce stiffness. You can do this with gentle massage or by placing your hands in warm water for 10-15 minutes. It is normal to feel some stretching, pulling, tightness, or mild discomfort as you begin new exercises. This will gradually improve. Stop an exercise right away if you feel sudden, severe pain or your pain gets worse. Ask your health care provider which exercises are best for you. Knuckle bend or "claw" fist 1. Stand or sit with your arm, hand, and all five fingers pointed straight up. Make sure to keep your wrist straight during the exercise. 2. Gently bend your fingers down toward your palm until the tips of your fingers are touching the top of your palm. Keep your big knuckle straight and just bend the small knuckles in your fingers. 3. Hold this position for __________ seconds. 4. Straighten (extend) your fingers back to the starting position. Repeat this exercise 5-10 times with each hand. Full finger fist 1. Stand or sit with your arm, hand, and all five fingers pointed straight up. Make sure to keep your wrist straight during the exercise. 2. Gently bend your fingers into your palm until the tips of your fingers are touching the middle of your palm. 3. Hold this position for __________ seconds. 4. Extend your fingers back to the starting position, stretching every  joint fully. Repeat this exercise 5-10 times with each hand. Straight fist 1. Stand or sit with your arm, hand, and all five fingers pointed straight up. Make sure to keep your wrist straight during the exercise. 2. Gently bend your fingers at the big knuckle, where your fingers meet your hand, and the middle knuckle. Keep the knuckle at the tips of your fingers straight and try to touch the bottom of your palm. 3. Hold this position for __________ seconds. 4. Extend your fingers back to the starting position, stretching every joint fully. Repeat this exercise 5-10 times with each hand. Tabletop 1. Stand or sit with your arm, hand, and all five fingers pointed straight up. Make sure to keep your wrist straight during the exercise. 2. Gently bend your fingers at the big knuckle, where your fingers meet your hand, as far down as you can while keeping the small knuckles in your fingers straight. Think of forming a tabletop with your fingers. 3. Hold this position for __________ seconds. 4. Extend your fingers back to the starting position, stretching every joint fully. Repeat this exercise 5-10 times with each hand. Finger spread 1. Place your hand flat on a table with your palm facing down. Make sure your wrist stays straight as you do this exercise. 2. Spread your fingers and thumb apart from each other as far as you can until you feel a gentle stretch. Hold this position for __________ seconds. 3. Bring your fingers and thumb tight together again. Hold this position for __________ seconds. Repeat this exercise 5-10 times with each hand.   Making circles 1. Stand or sit with your arm, hand, and all five fingers pointed straight up. Make sure to keep your wrist straight during the exercise. 2. Make a circle by touching the tip of your thumb to the tip of your index finger. 3. Hold for __________ seconds. Then open your hand wide. 4. Repeat this motion with your thumb and each finger on your  hand. Repeat this exercise 5-10 times with each hand. Thumb motion 1. Sit with your forearm resting on a table and your wrist straight. Your thumb should be facing up toward the ceiling. Keep your fingers relaxed as you move your thumb. 2. Lift your thumb up as high as you can toward the ceiling. Hold for __________ seconds. 3. Bend your thumb across your palm as far as you can, reaching the tip of your thumb for the small finger (pinkie) side of your palm. Hold for __________ seconds. Repeat this exercise 5-10 times with each hand. Grip strengthening 1. Hold a stress ball or other soft ball in the middle of your hand. 2. Slowly increase the pressure, squeezing the ball as much as you can without causing pain. Think of bringing the tips of your fingers into the middle of your palm. All of your finger joints should bend when doing this exercise. 3. Hold your squeeze for __________ seconds, then relax. Repeat this exercise 5-10 times with each hand.   Contact a health care provider if:  Your hand pain or discomfort gets much worse when you do an exercise.  Your hand pain or discomfort does not improve within 2 hours after you exercise. If you have any of these problems, stop doing these exercises right away. Do not do them again unless your health care provider says that you can. Get help right away if:  You develop sudden, severe hand pain or swelling. If this happens, stop doing these exercises right away. Do not do them again unless your health care provider says that you can. This information is not intended to replace advice given to you by your health care provider. Make sure you discuss any questions you have with your health care provider. Document Revised: 11/01/2018 Document Reviewed: 07/12/2018 Elsevier Patient Education  2021 [employer]  

## 2020-09-15 DIAGNOSIS — L570 Actinic keratosis: Secondary | ICD-10-CM | POA: Diagnosis not present

## 2020-09-21 DIAGNOSIS — E1151 Type 2 diabetes mellitus with diabetic peripheral angiopathy without gangrene: Secondary | ICD-10-CM | POA: Diagnosis not present

## 2020-09-21 DIAGNOSIS — E785 Hyperlipidemia, unspecified: Secondary | ICD-10-CM | POA: Diagnosis not present

## 2020-09-21 DIAGNOSIS — J302 Other seasonal allergic rhinitis: Secondary | ICD-10-CM | POA: Diagnosis not present

## 2020-09-21 DIAGNOSIS — I10 Essential (primary) hypertension: Secondary | ICD-10-CM | POA: Diagnosis not present

## 2020-09-21 DIAGNOSIS — M1990 Unspecified osteoarthritis, unspecified site: Secondary | ICD-10-CM | POA: Diagnosis not present

## 2020-09-21 DIAGNOSIS — K219 Gastro-esophageal reflux disease without esophagitis: Secondary | ICD-10-CM | POA: Diagnosis not present

## 2020-09-21 DIAGNOSIS — F329 Major depressive disorder, single episode, unspecified: Secondary | ICD-10-CM | POA: Diagnosis not present

## 2020-09-28 NOTE — H&P (View-Only) (Signed)
[name] Date of Birth: Dec 03, 1945   History of Present Illness: [name] is seen for follow up CAD.  She is s/p CABG in 2005 with LIMA to LAD and left radial graft to the first diagonal. Myoview in March 2018 was normal.  Echo in 2015 unremarkable. She was seen by Dr. Elsworth Soho and diagnosed with OSA by at home testing. She is currently using nocturnal oxygen. In 2018 she underwent lumbar laminectomy complicated by a dural tear that was repaired. Since that time she has continued to have back pain that limits her activity. When seen in 2019 complained of dyspnea. Echo showed moderate LVH but was otherwise normal. She had followup with pulmonary. PFTs showed moderate restriction.   She was admitted in August 2020 with Covid 19 PNA. Managed with oxygen therapy and steroids, Remdesivir, and Actemsa. She reports since she had Covid she is still on oxygen at night. States she doesn't know if she'll ever get over Covid. Pulse ox.drops down to 87 at times.   She is seen with her daughter today. She has not felt well. Still gets out of breath very easily. Has sweats with activity and also complains that her chest feels heavy. Worse with activity. Unclear on how long this lasts but more than a minute. Just rests until it goes away. Hasn't taken Ntg.    Current Outpatient Medications on File Prior to Visit  Medication Sig Dispense Refill  . acetaminophen (TYLENOL) 325 MG tablet as needed.    Marland Kitchen albuterol (VENTOLIN HFA) 108 (90 Base) MCG/ACT inhaler Inhale 2 puffs into the lungs every 4 (four) hours as needed for wheezing or shortness of breath (cough, shortness of breath or wheezing.). 6.7 g 0  . amLODipine (NORVASC) 5 MG tablet Take 5 mg by mouth in the morning.    . baclofen (LIORESAL) 10 MG tablet TAKE 1 TABLET BY MOUTH TWICE A DAY AS NEEDED FOR SPASMS (Patient taking differently: Take 10 mg by mouth 2 (two) times daily.) 180 tablet 0  . betamethasone dipropionate (DIPROLENE) 0.05 % ointment Apply 1  application topically daily.    . Biotin 300 MCG TABS Take 1 tablet by mouth daily.    Marland Kitchen buPROPion (WELLBUTRIN XL) 150 MG 24 hr tablet Take 150 mg by mouth in the morning.    . busPIRone (BUSPAR) 7.5 MG tablet Take 7.5 mg by mouth 2 (two) times daily as needed (anxiety).    . Calcium Carb-Cholecalciferol (CALCIUM 1000 + D PO) Take by mouth daily.    . cholecalciferol (VITAMIN D3) 25 MCG (1000 UT) tablet Take 1,000 Units by mouth daily.    . clobetasol cream (TEMOVATE) 0.05 % clobetasol 0.05 % topical cream  APPLY TO AFFECTED AREA UP TO TWICE DAILY FOR ECZEMA OR ITCHING    . clotrimazole (LOTRIMIN) 1 % cream Apply 1 application topically as needed.    . Cyanocobalamin (VITAMIN B-12 PO) Take by mouth daily.    Marland Kitchen dexamethasone (DECADRON) 6 MG tablet dexamethasone 6 mg tablet  TAKE 1 TABLET BY MOUTH EVERY DAY FOR COPD EXACERBATION    . DULoxetine (CYMBALTA) 60 MG capsule Take 60 mg by mouth in the morning.    . Echinacea 400 MG CAPS Take by mouth daily.    Marland Kitchen ezetimibe (ZETIA) 10 MG tablet Take 1 tablet (10 mg total) by mouth daily. (Patient taking differently: Take 10 mg by mouth in the morning.) 30 tablet 0  . fluticasone (FLONASE) 50 MCG/ACT nasal spray Place 2 sprays into both  nostrils 2 (two) times a day.    . gabapentin (NEURONTIN) 300 MG capsule Take 600 mg by mouth 3 (three) times daily.     Marland Kitchen HYDROcodone-acetaminophen (NORCO/VICODIN) 5-325 MG tablet Take 1 tablet by mouth every 6 (six) hours as needed (pain).    . hydroxychloroquine (PLAQUENIL) 200 MG tablet TAKE 1 TABLET TWICE DAILY FOR RHEUMATOID ARTHRITIS (Patient taking differently: Take 200 mg by mouth 2 (two) times daily.) 180 tablet 0  . Loperamide HCl (ANTI-DIARRHEAL PO)     . meloxicam (MOBIC) 15 MG tablet Take 15 mg by mouth 2 (two) times a week. Wednesdays & Saturdays    . MISC NATURAL PRODUCT OP Take 50 mg by mouth. CBD OIL    . montelukast (SINGULAIR) 10 MG tablet Take 10 mg by mouth at bedtime.    . Multiple Vitamins-Minerals  (HAIR SKIN NAILS PO) Take by mouth daily.    . Multiple Vitamins-Minerals (ZINC PO) Take 1 tablet by mouth daily.    . mupirocin ointment (BACTROBAN) 2 % as needed.    . nebivolol (BYSTOLIC) 10 MG tablet Take 20 mg by mouth every evening.    . nystatin (MYCOSTATIN) 100000 UNIT/ML suspension     . olmesartan-hydrochlorothiazide (BENICAR HCT) 40-25 MG tablet Take 1 tablet by mouth in the morning.  5  . omeprazole (PRILOSEC) 20 MG capsule Take 20 mg by mouth in the morning.    Marland Kitchen OVER THE COUNTER MEDICATION Swedish bitters    . OXYGEN in 2021 wears each night and prn during the day    . Polyethylene Glycol 3350 (MIRALAX PO) as needed.    . Potassium 99 MG TABS Take by mouth daily.    . Probiotic Product (PROBIOTIC DAILY PO) Take by mouth.    . SUPER B COMPLEX/C PO Take by mouth daily.    . vitamin E 180 MG (400 UNITS) capsule Take 1 capsule by mouth every evening.    Marland Kitchen ACCU-CHEK GUIDE test strip  (Patient not taking: No sig reported)    . blood glucose meter kit and supplies Dispense based on patient and insurance preference. Use up to four times daily as directed. (FOR ICD-10 E10.9, E11.9). (Patient not taking: No sig reported) 1 each 0  . folic acid (FOLVITE) 1 MG tablet Take 2 tablets (2 mg total) by mouth daily. (Patient taking differently: Take 1 mg by mouth in the morning and at bedtime.) 180 tablet 4   Current Facility-Administered Medications on File Prior to Visit  Medication Dose Route Frequency Provider Last Rate Last Admin  . betamethasone acetate-betamethasone sodium phosphate (CELESTONE) injection 6 mg  6 mg Other Once Evelina Bucy, DPM        Allergies  Allergen Reactions  . Statins Other (See Comments)    Joint pain and swelling/burning  . Alirocumab Other (See Comments)    (praluent) all sorts of side effects. strange.  she had burning, weakness, felt joints hurt more, felt asthma flared.  . Aricept [Donepezil]     Other reaction(s): leg pains  . Atorvastatin      Other reaction(s): Unknown  . Azithromycin     Other reaction(s): rash  . Crestor [Rosuvastatin]     Other reaction(s): even 1/2 of 5 mg 2-3 days a week couldn't take due to muscle side effects.  . Diazepam   . Dry Skin Treatment [Albolene]     Other reaction(s): contact dermatitis when in covid hospital  . Evolocumab     Other reaction(s): felt like going  to pass out.legs were weak.  . Ezetimibe     Other reaction(s): leg cramps  . Fenofibrate Micronized     Other reaction(s): cramps  . Fentanyl   . Loratadine     Other reaction(s): leg cramps  . Metformin     Other reaction(s): upset stomach  . Morphine And Related Other (See Comments)    hallucinations  . Niaspan [Niacin]     Other reaction(s): burning  . Omeprazole     Other reaction(s): stomach problems  . Other   . Phentermine-Topiramate     Other reaction(s): just didn't work for her.  Earnestine Mealing [Colesevelam]     Other reaction(s): Unknown  . Zocor [Simvastatin]     Other reaction(s): muscle aches    Past Medical History:  Diagnosis Date  . Actinic keratosis   . Asthma    Albuterol inhaler as needed.Pulmicort neb as needed  . Asthma   . Breast cancer ([place])    right - lumpectomy   . Chronic back pain    spinal stenosis  . Coronary artery disease   . Dependence on nocturnal oxygen therapy 07/10/2017   Pt uses 2 liters 02 at night   . Depression    takes CYmbalta and Wellbutrin daily  . Diabetes mellitus    not on any meds/controlled by diet  . Dyspnea    with exertion occasionally when lies down  . GERD (gastroesophageal reflux disease)    takes Omeprazole daily  . Headache    oocasionally  . History of kidney stones   . Hyperlipidemia    not on any meds  . Hypertension    takes Benazepril,Bystolic,and Amlodipine daily  . IBS (irritable bowel syndrome)   . Joint pain   . Joint swelling   . Livedoid vasculitis   . Nocturia   . OA (osteoarthritis) of knee   . OSA (obstructive sleep apnea)   .  Other seborrheic keratosis   . Oxygen deficiency    2 liters at night per pt for OSA- no cpap   . Peripheral edema    takes daily as needed  . Peripheral neuropathy   . Personal history of chemotherapy   . Personal history of radiation therapy   . Pneumonia    hx of-several yrs ago  . RA (rheumatoid arthritis) ([place])   . Sleep apnea    pt states she uses oxygen at night - no cpap- uses 02 2 liters at night   . Urinary frequency   . Urinary urgency   . Weakness    numbness and tingling mainly in left leg occasionally in right.Tingling/numbness in hands    Past Surgical History:  Procedure Laterality Date  . BREAST LUMPECTOMY  1997   RIGHT BREAST  . CARDIAC CATHETERIZATION  04/23/2004   EF 60%  . cataract surgery Bilateral   . COLONOSCOPY    . COLONOSCOPY WITH PROPOFOL N/A 08/07/2017   Procedure: COLONOSCOPY WITH PROPOFOL;  Surgeon: Ladene Artist, MD;  Location: [facility];  Service: Endoscopy;  Laterality: N/A;  . CORONARY ARTERY BYPASS GRAFT  2005   LIMA GRAFT TO LAD, SAPHENOUS VEIN GRAFT TO THE FIRST DIAGONAL, AND LEFT RADIAL ARTERY GRAFT TO THE OM  . ESOPHAGOGASTRODUODENOSCOPY (EGD) WITH PROPOFOL N/A 08/07/2017   Procedure: ESOPHAGOGASTRODUODENOSCOPY (EGD) WITH PROPOFOL;  Surgeon: Ladene Artist, MD;  Location: [facility];  Service: Endoscopy;  Laterality: N/A;  . FOOT SURGERY Right   . JOINT REPLACEMENT    . LITHOTRIPSY    .  LUMBAR LAMINECTOMY/DECOMPRESSION MICRODISCECTOMY N/A 09/08/2015   Procedure: CENTRAL DECOMPRESSIVE LUMBAR LAMINECTOMIES L3-4, L4-5 AND BILATERAL HEMILAMINECTOMY L5-S1;  Surgeon: Jessy Oto, MD;  Location: [facility];  Service: Orthopedics;  Laterality: N/A;  . nodule removed from left elbow    . SHOULDER ARTHROSCOPY    . TOTAL KNEE ARTHROPLASTY Bilateral   . TOTAL SHOULDER ARTHROPLASTY    . TRIPLE BYPASS  04/27/05  . US ECHOCARDIOGRAPHY  01/06/2009   EF 55-60%  . WRIST SURGERY Left     Social History   Tobacco Use  Smoking Status Never Smoker   Smokeless Tobacco Never Used    Social History   Substance and Sexual Activity  Alcohol Use No  . Alcohol/week: 0.0 standard drinks    Family History  Problem Relation Age of Onset  . Heart disease Mother   . Cancer Father   . Cancer Brother   . Heart disease Brother     Review of Systems: As noted history of present illness.  All other systems were reviewed and are negative.  Physical Exam: BP 118/82   Pulse 65   Ht 5' 4" (1.626 m)   Wt 244 lb (110.7 kg)   SpO2 99%   BMI 41.88 kg/m  GENERAL:  Well appearing obese WF uses a cane to walk. HEENT:  PERRL, EOMI, sclera are clear. Oropharynx is clear. NECK:  No jugular venous distention, carotid upstroke brisk and symmetric, no bruits, no thyromegaly or adenopathy LUNGS:  Clear to auscultation bilaterally CHEST:  Unremarkable HEART:  RRR,  PMI not displaced or sustained,S1 and S2 within normal limits, no S3, no S4: no clicks, no rubs, no murmurs ABD:  Soft, nontender. BS +, no masses or bruits. No hepatomegaly, no splenomegaly EXT:  2 + pulses throughout, no edema, no cyanosis no clubbing. Old surgical scar left wrist from radial harvest.  SKIN:  Warm and dry.  No rashes NEURO:  Alert and oriented x 3. Cranial nerves II through XII intact. PSYCH:  Cognitively intact    LABORATORY DATA:  Lab Results  Component Value Date   WBC 7.1 03/02/2020   HGB 13.1 03/02/2020   HCT 40.2 03/02/2020   PLT 161 03/02/2020   GLUCOSE 134 (H) 03/02/2020   CHOL 202 (H) 02/28/2019   TRIG 122 02/28/2019   HDL 36 (L) 02/28/2019   LDLCALC 142 (H) 02/28/2019   ALT 20 03/02/2020   AST 19 03/02/2020   NA 141 03/02/2020   K 4.2 03/02/2020   CL 102 03/02/2020   CREATININE 0.58 (L) 03/02/2020   BUN 16 03/02/2020   CO2 28 03/02/2020   TSH 2.93 07/28/2017   INR 1.07 04/08/2011   HGBA1C 8.1 (H) 02/26/2019   Labs dated 08/02/16: cholesterol 286, triglycerides 181, HDL 52, LDL 198, A1c 6.5%. Dated 08/25/17: cholesterol 257, triglycerides  121, HDL 59, LDL 174. A1c 7.6%. Normal chemistries, TSH, Hgb.  Dated 07/03/19: cholesterol 192, triglycerides 159, HDL 45, LDL 115. A1c 6.0%.  Dated 11/19/19: cholesterol 195, triglycerides 126, HDL 48, LDL 120.  Dated 08/18/20: A1c 7%.   Ecg today shows NSR rate 65. Old septal infarct. I have personally reviewed and interpreted this study.   Echo: 2/16/15Study Conclusions  - Left ventricle: The cavity size was normal. Wall thickness was normal. Systolic function was normal. The estimated ejection fraction was in the range of 50% to 55%. - Mitral valve: Nodular calcification of anterior leaflet tip Mild regurgitation. - Left atrium: The atrium was moderately dilated. - Atrial septum: No defect  or patent foramen ovale was identified. - Pericardium, extracardiac: A trivial pericardial effusion was identified posterior to the heart. - Impressions: Elevated E/E' ratio suggested diastolic dysfunction with elevated EDP Impressions:  - Elevated E/E' ratio suggested diastolic dysfunction with elevated EDP   Myoview 09/29/16: Study Highlights     The left ventricular ejection fraction is mildly decreased (45-54%).  Nuclear stress EF: 51%.  There was no ST segment deviation noted during stress.  The study is normal.  This is a low risk study.   Normal stress nuclear study with no ischemia or infarction; EF 51 with normal wall motion; cannot R/O left breast mass; suggest mammogram if not performed recently.    Echo 09/20/17: Study Conclusions   - Left ventricle: Wall thickness was increased in a pattern of  moderate LVH. Systolic function was normal. The estimated  ejection fraction was in the range of 55% to 60%. Features are  consistent with a pseudonormal left ventricular filling pattern,  with concomitant abnormal relaxation and increased filling  pressure (grade 2 diastolic dysfunction).    Assessment / Plan: 1. Coronary disease.  She is status  post CABG in 2005. Nuclear stress test in March 2018 was normal. She is now experiencing symptoms of chest heaviness, sweats and DOE concerning for angina class 3. She Is on optimal medical therapy.  Continue Bystolic and amlodipine. On ASA. I have recommended a right and left heart cath with grafts. Procedure and risks reviewed in detail. The procedure and risks were reviewed including but not limited to death, myocardial infarction, stroke, arrythmias, bleeding, transfusion, emergency surgery, dye allergy, or renal dysfunction. The patient voices understanding and is agreeable to proceed. If cath looks OK will need pulmonary referral. .  2. Hypertension, well controlled. Continue current medication  3. Hyperlipidemia.  Statin intolerant. Now only on Zetia. LDL improved from 174>>120. Will update labs now. Goal LDL < 70. May need referral to lipid clinic.   4. Diabetes mellitus type 2.  5. Morbid obesity with OSA  6. S/p Covid 19 infection. Persistent oxygen requirement.

## 2020-09-28 NOTE — Progress Notes (Signed)
[name] Date of Birth: Dec 03, 1945   History of Present Illness: [name] is seen for follow up CAD.  She is s/p CABG in 2005 with LIMA to LAD and left radial graft to the first diagonal. Myoview in March 2018 was normal.  Echo in 2015 unremarkable. She was seen by Dr. Elsworth Soho and diagnosed with OSA by at home testing. She is currently using nocturnal oxygen. In 2018 she underwent lumbar laminectomy complicated by a dural tear that was repaired. Since that time she has continued to have back pain that limits her activity. When seen in 2019 complained of dyspnea. Echo showed moderate LVH but was otherwise normal. She had followup with pulmonary. PFTs showed moderate restriction.   She was admitted in August 2020 with Covid 19 PNA. Managed with oxygen therapy and steroids, Remdesivir, and Actemsa. She reports since she had Covid she is still on oxygen at night. States she doesn't know if she'll ever get over Covid. Pulse ox.drops down to 87 at times.   She is seen with her daughter today. She has not felt well. Still gets out of breath very easily. Has sweats with activity and also complains that her chest feels heavy. Worse with activity. Unclear on how long this lasts but more than a minute. Just rests until it goes away. Hasn't taken Ntg.    Current Outpatient Medications on File Prior to Visit  Medication Sig Dispense Refill  . acetaminophen (TYLENOL) 325 MG tablet as needed.    Marland Kitchen albuterol (VENTOLIN HFA) 108 (90 Base) MCG/ACT inhaler Inhale 2 puffs into the lungs every 4 (four) hours as needed for wheezing or shortness of breath (cough, shortness of breath or wheezing.). 6.7 g 0  . amLODipine (NORVASC) 5 MG tablet Take 5 mg by mouth in the morning.    . baclofen (LIORESAL) 10 MG tablet TAKE 1 TABLET BY MOUTH TWICE A DAY AS NEEDED FOR SPASMS (Patient taking differently: Take 10 mg by mouth 2 (two) times daily.) 180 tablet 0  . betamethasone dipropionate (DIPROLENE) 0.05 % ointment Apply 1  application topically daily.    . Biotin 300 MCG TABS Take 1 tablet by mouth daily.    Marland Kitchen buPROPion (WELLBUTRIN XL) 150 MG 24 hr tablet Take 150 mg by mouth in the morning.    . busPIRone (BUSPAR) 7.5 MG tablet Take 7.5 mg by mouth 2 (two) times daily as needed (anxiety).    . Calcium Carb-Cholecalciferol (CALCIUM 1000 + D PO) Take by mouth daily.    . cholecalciferol (VITAMIN D3) 25 MCG (1000 UT) tablet Take 1,000 Units by mouth daily.    . clobetasol cream (TEMOVATE) 0.05 % clobetasol 0.05 % topical cream  APPLY TO AFFECTED AREA UP TO TWICE DAILY FOR ECZEMA OR ITCHING    . clotrimazole (LOTRIMIN) 1 % cream Apply 1 application topically as needed.    . Cyanocobalamin (VITAMIN B-12 PO) Take by mouth daily.    Marland Kitchen dexamethasone (DECADRON) 6 MG tablet dexamethasone 6 mg tablet  TAKE 1 TABLET BY MOUTH EVERY DAY FOR COPD EXACERBATION    . DULoxetine (CYMBALTA) 60 MG capsule Take 60 mg by mouth in the morning.    . Echinacea 400 MG CAPS Take by mouth daily.    Marland Kitchen ezetimibe (ZETIA) 10 MG tablet Take 1 tablet (10 mg total) by mouth daily. (Patient taking differently: Take 10 mg by mouth in the morning.) 30 tablet 0  . fluticasone (FLONASE) 50 MCG/ACT nasal spray Place 2 sprays into both  nostrils 2 (two) times a day.    . gabapentin (NEURONTIN) 300 MG capsule Take 600 mg by mouth 3 (three) times daily.     . HYDROcodone-acetaminophen (NORCO/VICODIN) 5-325 MG tablet Take 1 tablet by mouth every 6 (six) hours as needed (pain).    . hydroxychloroquine (PLAQUENIL) 200 MG tablet TAKE 1 TABLET TWICE DAILY FOR RHEUMATOID ARTHRITIS (Patient taking differently: Take 200 mg by mouth 2 (two) times daily.) 180 tablet 0  . Loperamide HCl (ANTI-DIARRHEAL PO)     . meloxicam (MOBIC) 15 MG tablet Take 15 mg by mouth 2 (two) times a week. Wednesdays & Saturdays    . MISC NATURAL PRODUCT OP Take 50 mg by mouth. CBD OIL    . montelukast (SINGULAIR) 10 MG tablet Take 10 mg by mouth at bedtime.    . Multiple Vitamins-Minerals  (HAIR SKIN NAILS PO) Take by mouth daily.    . Multiple Vitamins-Minerals (ZINC PO) Take 1 tablet by mouth daily.    . mupirocin ointment (BACTROBAN) 2 % as needed.    . nebivolol (BYSTOLIC) 10 MG tablet Take 20 mg by mouth every evening.    . nystatin (MYCOSTATIN) 100000 UNIT/ML suspension     . olmesartan-hydrochlorothiazide (BENICAR HCT) 40-25 MG tablet Take 1 tablet by mouth in the morning.  5  . omeprazole (PRILOSEC) 20 MG capsule Take 20 mg by mouth in the morning.    . OVER THE COUNTER MEDICATION Swedish bitters    . OXYGEN in 2021 wears each night and prn during the day    . Polyethylene Glycol 3350 (MIRALAX PO) as needed.    . Potassium 99 MG TABS Take by mouth daily.    . Probiotic Product (PROBIOTIC DAILY PO) Take by mouth.    . SUPER B COMPLEX/C PO Take by mouth daily.    . vitamin E 180 MG (400 UNITS) capsule Take 1 capsule by mouth every evening.    . ACCU-CHEK GUIDE test strip  (Patient not taking: No sig reported)    . blood glucose meter kit and supplies Dispense based on patient and insurance preference. Use up to four times daily as directed. (FOR ICD-10 E10.9, E11.9). (Patient not taking: No sig reported) 1 each 0  . folic acid (FOLVITE) 1 MG tablet Take 2 tablets (2 mg total) by mouth daily. (Patient taking differently: Take 1 mg by mouth in the morning and at bedtime.) 180 tablet 4   Current Facility-Administered Medications on File Prior to Visit  Medication Dose Route Frequency Provider Last Rate Last Admin  . betamethasone acetate-betamethasone sodium phosphate (CELESTONE) injection 6 mg  6 mg Other Once Price, Michael J, DPM        Allergies  Allergen Reactions  . Statins Other (See Comments)    Joint pain and swelling/burning  . Alirocumab Other (See Comments)    (praluent) all sorts of side effects. strange.  she had burning, weakness, felt joints hurt more, felt asthma flared.  . Aricept [Donepezil]     Other reaction(s): leg pains  . Atorvastatin      Other reaction(s): Unknown  . Azithromycin     Other reaction(s): rash  . Crestor [Rosuvastatin]     Other reaction(s): even 1/2 of 5 mg 2-3 days a week couldn't take due to muscle side effects.  . Diazepam   . Dry Skin Treatment [Albolene]     Other reaction(s): contact dermatitis when in covid hospital  . Evolocumab     Other reaction(s): felt like going   to pass out.legs were weak.  . Ezetimibe     Other reaction(s): leg cramps  . Fenofibrate Micronized     Other reaction(s): cramps  . Fentanyl   . Loratadine     Other reaction(s): leg cramps  . Metformin     Other reaction(s): upset stomach  . Morphine And Related Other (See Comments)    hallucinations  . Niaspan [Niacin]     Other reaction(s): burning  . Omeprazole     Other reaction(s): stomach problems  . Other   . Phentermine-Topiramate     Other reaction(s): just didn't work for her.  . Welchol [Colesevelam]     Other reaction(s): Unknown  . Zocor [Simvastatin]     Other reaction(s): muscle aches    Past Medical History:  Diagnosis Date  . Actinic keratosis   . Asthma    Albuterol inhaler as needed.Pulmicort neb as needed  . Asthma   . Breast cancer (HCC)    right - lumpectomy   . Chronic back pain    spinal stenosis  . Coronary artery disease   . Dependence on nocturnal oxygen therapy 07/10/2017   Pt uses 2 liters 02 at night   . Depression    takes CYmbalta and Wellbutrin daily  . Diabetes mellitus    not on any meds/controlled by diet  . Dyspnea    with exertion occasionally when lies down  . GERD (gastroesophageal reflux disease)    takes Omeprazole daily  . Headache    oocasionally  . History of kidney stones   . Hyperlipidemia    not on any meds  . Hypertension    takes Benazepril,Bystolic,and Amlodipine daily  . IBS (irritable bowel syndrome)   . Joint pain   . Joint swelling   . Livedoid vasculitis   . Nocturia   . OA (osteoarthritis) of knee   . OSA (obstructive sleep apnea)   .  Other seborrheic keratosis   . Oxygen deficiency    2 liters at night per pt for OSA- no cpap   . Peripheral edema    takes daily as needed  . Peripheral neuropathy   . Personal history of chemotherapy   . Personal history of radiation therapy   . Pneumonia    hx of-several yrs ago  . RA (rheumatoid arthritis) (HCC)   . Sleep apnea    pt states she uses oxygen at night - no cpap- uses 02 2 liters at night   . Urinary frequency   . Urinary urgency   . Weakness    numbness and tingling mainly in left leg occasionally in right.Tingling/numbness in hands    Past Surgical History:  Procedure Laterality Date  . BREAST LUMPECTOMY  1997   RIGHT BREAST  . CARDIAC CATHETERIZATION  04/23/2004   EF 60%  . cataract surgery Bilateral   . COLONOSCOPY    . COLONOSCOPY WITH PROPOFOL N/A 08/07/2017   Procedure: COLONOSCOPY WITH PROPOFOL;  Surgeon: Stark, Malcolm T, MD;  Location: [facility];  Service: Endoscopy;  Laterality: N/A;  . CORONARY ARTERY BYPASS GRAFT  2005   LIMA GRAFT TO LAD, SAPHENOUS VEIN GRAFT TO THE FIRST DIAGONAL, AND LEFT RADIAL ARTERY GRAFT TO THE OM  . ESOPHAGOGASTRODUODENOSCOPY (EGD) WITH PROPOFOL N/A 08/07/2017   Procedure: ESOPHAGOGASTRODUODENOSCOPY (EGD) WITH PROPOFOL;  Surgeon: Stark, Malcolm T, MD;  Location: [facility];  Service: Endoscopy;  Laterality: N/A;  . FOOT SURGERY Right   . JOINT REPLACEMENT    . LITHOTRIPSY    .   LUMBAR LAMINECTOMY/DECOMPRESSION MICRODISCECTOMY N/A 09/08/2015   Procedure: CENTRAL DECOMPRESSIVE LUMBAR LAMINECTOMIES L3-4, L4-5 AND BILATERAL HEMILAMINECTOMY L5-S1;  Surgeon: James E Nitka, MD;  Location: [facility] OR;  Service: Orthopedics;  Laterality: N/A;  . nodule removed from left elbow    . SHOULDER ARTHROSCOPY    . TOTAL KNEE ARTHROPLASTY Bilateral   . TOTAL SHOULDER ARTHROPLASTY    . TRIPLE BYPASS  04/27/05  . US ECHOCARDIOGRAPHY  01/06/2009   EF 55-60%  . WRIST SURGERY Left     Social History   Tobacco Use  Smoking Status Never Smoker   Smokeless Tobacco Never Used    Social History   Substance and Sexual Activity  Alcohol Use No  . Alcohol/week: 0.0 standard drinks    Family History  Problem Relation Age of Onset  . Heart disease Mother   . Cancer Father   . Cancer Brother   . Heart disease Brother     Review of Systems: As noted history of present illness.  All other systems were reviewed and are negative.  Physical Exam: BP 118/82   Pulse 65   Ht 5' 4" (1.626 m)   Wt 244 lb (110.7 kg)   SpO2 99%   BMI 41.88 kg/m  GENERAL:  Well appearing obese WF uses a cane to walk. HEENT:  PERRL, EOMI, sclera are clear. Oropharynx is clear. NECK:  No jugular venous distention, carotid upstroke brisk and symmetric, no bruits, no thyromegaly or adenopathy LUNGS:  Clear to auscultation bilaterally CHEST:  Unremarkable HEART:  RRR,  PMI not displaced or sustained,S1 and S2 within normal limits, no S3, no S4: no clicks, no rubs, no murmurs ABD:  Soft, nontender. BS +, no masses or bruits. No hepatomegaly, no splenomegaly EXT:  2 + pulses throughout, no edema, no cyanosis no clubbing. Old surgical scar left wrist from radial harvest.  SKIN:  Warm and dry.  No rashes NEURO:  Alert and oriented x 3. Cranial nerves II through XII intact. PSYCH:  Cognitively intact    LABORATORY DATA:  Lab Results  Component Value Date   WBC 7.1 03/02/2020   HGB 13.1 03/02/2020   HCT 40.2 03/02/2020   PLT 161 03/02/2020   GLUCOSE 134 (H) 03/02/2020   CHOL 202 (H) 02/28/2019   TRIG 122 02/28/2019   HDL 36 (L) 02/28/2019   LDLCALC 142 (H) 02/28/2019   ALT 20 03/02/2020   AST 19 03/02/2020   NA 141 03/02/2020   K 4.2 03/02/2020   CL 102 03/02/2020   CREATININE 0.58 (L) 03/02/2020   BUN 16 03/02/2020   CO2 28 03/02/2020   TSH 2.93 07/28/2017   INR 1.07 04/08/2011   HGBA1C 8.1 (H) 02/26/2019   Labs dated 08/02/16: cholesterol 286, triglycerides 181, HDL 52, LDL 198, A1c 6.5%. Dated 08/25/17: cholesterol 257, triglycerides  121, HDL 59, LDL 174. A1c 7.6%. Normal chemistries, TSH, Hgb.  Dated 07/03/19: cholesterol 192, triglycerides 159, HDL 45, LDL 115. A1c 6.0%.  Dated 11/19/19: cholesterol 195, triglycerides 126, HDL 48, LDL 120.  Dated 08/18/20: A1c 7%.   Ecg today shows NSR rate 65. Old septal infarct. I have personally reviewed and interpreted this study.   Echo: 2/16/15Study Conclusions  - Left ventricle: The cavity size was normal. Wall thickness was normal. Systolic function was normal. The estimated ejection fraction was in the range of 50% to 55%. - Mitral valve: Nodular calcification of anterior leaflet tip Mild regurgitation. - Left atrium: The atrium was moderately dilated. - Atrial septum: No defect   or patent foramen ovale was identified. - Pericardium, extracardiac: A trivial pericardial effusion was identified posterior to the heart. - Impressions: Elevated E/E' ratio suggested diastolic dysfunction with elevated EDP Impressions:  - Elevated E/E' ratio suggested diastolic dysfunction with elevated EDP   Myoview 09/29/16: Study Highlights     The left ventricular ejection fraction is mildly decreased (45-54%).  Nuclear stress EF: 51%.  There was no ST segment deviation noted during stress.  The study is normal.  This is a low risk study.   Normal stress nuclear study with no ischemia or infarction; EF 51 with normal wall motion; cannot R/O left breast mass; suggest mammogram if not performed recently.    Echo 09/20/17: Study Conclusions   - Left ventricle: Wall thickness was increased in a pattern of  moderate LVH. Systolic function was normal. The estimated  ejection fraction was in the range of 55% to 60%. Features are  consistent with a pseudonormal left ventricular filling pattern,  with concomitant abnormal relaxation and increased filling  pressure (grade 2 diastolic dysfunction).    Assessment / Plan: 1. Coronary disease.  She is status  post CABG in 2005. Nuclear stress test in March 2018 was normal. She is now experiencing symptoms of chest heaviness, sweats and DOE concerning for angina class 3. She Is on optimal medical therapy.  Continue Bystolic and amlodipine. On ASA. I have recommended a right and left heart cath with grafts. Procedure and risks reviewed in detail. The procedure and risks were reviewed including but not limited to death, myocardial infarction, stroke, arrythmias, bleeding, transfusion, emergency surgery, dye allergy, or renal dysfunction. The patient voices understanding and is agreeable to proceed. If cath looks OK will need pulmonary referral. .  2. Hypertension, well controlled. Continue current medication  3. Hyperlipidemia.  Statin intolerant. Now only on Zetia. LDL improved from 174>>120. Will update labs now. Goal LDL < 70. May need referral to lipid clinic.   4. Diabetes mellitus type 2.  5. Morbid obesity with OSA  6. S/p Covid 19 infection. Persistent oxygen requirement.       

## 2020-09-30 DIAGNOSIS — Z79899 Other long term (current) drug therapy: Secondary | ICD-10-CM | POA: Diagnosis not present

## 2020-09-30 DIAGNOSIS — M47812 Spondylosis without myelopathy or radiculopathy, cervical region: Secondary | ICD-10-CM | POA: Diagnosis not present

## 2020-09-30 DIAGNOSIS — M069 Rheumatoid arthritis, unspecified: Secondary | ICD-10-CM | POA: Diagnosis not present

## 2020-09-30 DIAGNOSIS — E1140 Type 2 diabetes mellitus with diabetic neuropathy, unspecified: Secondary | ICD-10-CM | POA: Diagnosis not present

## 2020-09-30 DIAGNOSIS — M47816 Spondylosis without myelopathy or radiculopathy, lumbar region: Secondary | ICD-10-CM | POA: Diagnosis not present

## 2020-10-02 ENCOUNTER — Other Ambulatory Visit: Payer: Self-pay | Admitting: Cardiology

## 2020-10-02 ENCOUNTER — Other Ambulatory Visit: Payer: Self-pay

## 2020-10-02 ENCOUNTER — Ambulatory Visit: Payer: Medicare HMO | Admitting: Cardiology

## 2020-10-02 ENCOUNTER — Encounter: Payer: Self-pay | Admitting: Cardiology

## 2020-10-02 VITALS — BP 118/82 | HR 65 | Ht 64.0 in | Wt 244.0 lb

## 2020-10-02 DIAGNOSIS — E7800 Pure hypercholesterolemia, unspecified: Secondary | ICD-10-CM | POA: Diagnosis not present

## 2020-10-02 DIAGNOSIS — I25708 Atherosclerosis of coronary artery bypass graft(s), unspecified, with other forms of angina pectoris: Secondary | ICD-10-CM

## 2020-10-02 DIAGNOSIS — I10 Essential (primary) hypertension: Secondary | ICD-10-CM

## 2020-10-02 DIAGNOSIS — I739 Peripheral vascular disease, unspecified: Secondary | ICD-10-CM

## 2020-10-02 MED ORDER — NITROGLYCERIN 0.4 MG SL SUBL
SUBLINGUAL_TABLET | SUBLINGUAL | 11 refills | Status: AC
Start: 2020-10-02 — End: ?

## 2020-10-02 MED ORDER — SODIUM CHLORIDE 0.9% FLUSH: 3.0000 mL | Freq: Two times a day (BID) | INTRAVENOUS | Status: AC

## 2020-10-02 NOTE — Patient Instructions (Signed)
Medication Instructions:  Continue same medications *If you need a refill on your cardiac medications before your next appointment, please call your pharmacy*   Lab Work: Bmet,cbc,pt,lipid and hepatic panels today  Covid test Sat 3/12 at 10:35 am at   [address] Th[address] Quarantine until after cath   Testing/Procedures: Cardiac Cath  Follow instructions below     Follow-Up: At [facility], you and your health needs are our priority.  As part of our continuing mission to provide you with exceptional heart care, we have created designated Provider Care Teams.  These Care Teams include your primary Cardiologist (physician) and Advanced Practice Providers (APPs -  Physician Assistants and Nurse Practitioners) who all work together to provide you with the care you need, when you need it.  We recommend signing up for the patient portal called "MyChart".  Sign up information is provided on this After Visit Summary.  MyChart is used to connect with patients for Virtual Visits (Telemedicine).  Patients are able to view lab/test results, encounter notes, upcoming appointments, etc.  Non-urgent messages can be sent to your provider as well.   To learn more about what you can do with MyChart, go to [email or web address].    Your next appointment:  Thursday  10/22/20 at 11:20 am   The format for your next appointment:  Office   Provider:  [place] [address] Dept: [place]: [place]  10/02/2020  You are scheduled for a Cardiac Cath on Tuesday 10/06/20,  with Dr.Jordan.  1. Please arrive at the [facility] (Main Entrance A) at [facility]: [address] [place], [place] [address] at 7:00 am (This time is two hours before your procedure to ensure your preparation). Free valet parking service is  available.   Special note: Every effort is made to have your procedure done on time. Please understand that emergencies sometimes delay scheduled procedures.  2. Diet: Do not eat solid foods after midnight.  The patient may have clear liquids until 5am upon the day of the procedure.  3. Labs: You will need to have blood drawn on Friday 3/11 at [place] office      You do not need to be fasting.      Covid Test  LZJ 3/12 at 10:35 am  At [address] Thru [address]         Quarantine until after cath  4. Medication instructions in preparation for your procedure:   Hold Benicar/Hct  Morning of cath     On the morning of your procedure, take your Aspirin 81 mg  and any morning medicines NOT listed above.  You may use sips of water.  5. Plan for one night stay--bring personal belongings. 6. Bring a current list of your medications and current insurance cards. 7. You MUST have a responsible person to drive you home. 8. Someone MUST be with you the first 24 hours after you arrive home or your discharge will be delayed. 9. Please wear clothes that are easy to get on and off and wear slip-on shoes.  Thank you for allowing [place] to care for you!   -- [place] [facility]

## 2020-10-03 ENCOUNTER — Other Ambulatory Visit (HOSPITAL_COMMUNITY)
Admission: RE | Admit: 2020-10-03 | Discharge: 2020-10-03 | Disposition: A | Discharge status: Home / Self Care | Payer: Medicare HMO | Source: Ambulatory Visit | Attending: Cardiology | Admitting: Cardiology

## 2020-10-03 DIAGNOSIS — Z20822 Contact with and (suspected) exposure to covid-19: Secondary | ICD-10-CM | POA: Insufficient documentation

## 2020-10-03 DIAGNOSIS — Z01812 Encounter for preprocedural laboratory examination: Secondary | ICD-10-CM | POA: Diagnosis not present

## 2020-10-03 DIAGNOSIS — R0602 Shortness of breath: Secondary | ICD-10-CM | POA: Diagnosis not present

## 2020-10-03 DIAGNOSIS — J1289 Other viral pneumonia: Secondary | ICD-10-CM | POA: Diagnosis not present

## 2020-10-03 DIAGNOSIS — J9621 Acute and chronic respiratory failure with hypoxia: Secondary | ICD-10-CM | POA: Diagnosis not present

## 2020-10-03 DIAGNOSIS — U071 COVID-19: Secondary | ICD-10-CM | POA: Diagnosis not present

## 2020-10-03 LAB — HEPATIC FUNCTION PANEL
ALT: 27 IU/L (ref 0–32)
AST: 25 IU/L (ref 0–40)
Albumin: 4.6 g/dL (ref 3.7–4.7)
Alkaline Phosphatase: 54 IU/L (ref 44–121)
Bilirubin Total: 0.4 mg/dL (ref 0.0–1.2)
Bilirubin, Direct: 0.12 mg/dL (ref 0.00–0.40)
Total Protein: 7.2 g/dL (ref 6.0–8.5)

## 2020-10-03 LAB — LIPID PANEL
Chol/HDL Ratio: 4 ratio (ref 0.0–4.4)
Cholesterol, Total: 210 mg/dL — ABNORMAL HIGH (ref 100–199)
HDL: 52 mg/dL (ref >39)
LDL Chol Calc (NIH): 125 mg/dL — ABNORMAL HIGH (ref 0–99)
Triglycerides: 188 mg/dL — ABNORMAL HIGH (ref 0–149)
VLDL Cholesterol Cal: 33 mg/dL (ref 5–40)

## 2020-10-03 LAB — BASIC METABOLIC PANEL
BUN/Creatinine Ratio: 17 (ref 12–28)
BUN: 10 mg/dL (ref 8–27)
CO2: 24 mmol/L (ref 20–29)
Calcium: 9.3 mg/dL (ref 8.7–10.3)
Chloride: 102 mmol/L (ref 96–106)
Creatinine, Ser: 0.58 mg/dL (ref 0.57–1.00)
Glucose: 144 mg/dL — ABNORMAL HIGH (ref 65–99)
Potassium: 4 mmol/L (ref 3.5–5.2)
Sodium: 143 mmol/L (ref 134–144)
eGFR: 94 mL/min/{1.73_m2} (ref >59)

## 2020-10-03 LAB — CBC WITH DIFFERENTIAL/PLATELET
Basophils Absolute: 0 10*3/uL (ref 0.0–0.2)
Basos: 0 %
EOS (ABSOLUTE): 0.1 10*3/uL (ref 0.0–0.4)
Eos: 1 %
Hematocrit: 42.4 % (ref 34.0–46.6)
Hemoglobin: 13.6 g/dL (ref 11.1–15.9)
Immature Grans (Abs): 0 10*3/uL (ref 0.0–0.1)
Immature Granulocytes: 0 %
Lymphocytes Absolute: 3 10*3/uL (ref 0.7–3.1)
Lymphs: 40 %
MCH: 29.2 pg (ref 26.6–33.0)
MCHC: 32.1 g/dL (ref 31.5–35.7)
MCV: 91 fL (ref 79–97)
Monocytes Absolute: 0.7 10*3/uL (ref 0.1–0.9)
Monocytes: 10 %
Neutrophils Absolute: 3.8 10*3/uL (ref 1.4–7.0)
Neutrophils: 49 %
Platelets: 185 10*3/uL (ref 150–450)
RBC: 4.65 x10E6/uL (ref 3.77–5.28)
RDW: 13.7 % (ref 11.7–15.4)
WBC: 7.7 10*3/uL (ref 3.4–10.8)

## 2020-10-03 LAB — SARS CORONAVIRUS 2 (TAT 6-24 HRS): SARS Coronavirus 2: NEGATIVE

## 2020-10-03 LAB — PT AND PTT
INR: 1 (ref 0.9–1.2)
Prothrombin Time: 10.3 s (ref 9.1–12.0)
aPTT: 25 s (ref 24–33)

## 2020-10-06 ENCOUNTER — Ambulatory Visit (HOSPITAL_COMMUNITY)
Admission: RE | Disposition: A | Discharge status: Home / Self Care | Payer: Self-pay | Source: Home / Self Care | Attending: Cardiology

## 2020-10-06 ENCOUNTER — Other Ambulatory Visit: Payer: Self-pay

## 2020-10-06 ENCOUNTER — Ambulatory Visit (HOSPITAL_COMMUNITY)
Admission: RE | Admit: 2020-10-06 | Discharge: 2020-10-06 | Disposition: A | Discharge status: Home / Self Care | Payer: Medicare HMO | Attending: Cardiology | Admitting: Cardiology

## 2020-10-06 DIAGNOSIS — E785 Hyperlipidemia, unspecified: Secondary | ICD-10-CM | POA: Diagnosis not present

## 2020-10-06 DIAGNOSIS — I25110 Atherosclerotic heart disease of native coronary artery with unstable angina pectoris: Secondary | ICD-10-CM

## 2020-10-06 DIAGNOSIS — I272 Pulmonary hypertension, unspecified: Secondary | ICD-10-CM | POA: Insufficient documentation

## 2020-10-06 DIAGNOSIS — I25710 Atherosclerosis of autologous vein coronary artery bypass graft(s) with unstable angina pectoris: Secondary | ICD-10-CM | POA: Diagnosis not present

## 2020-10-06 DIAGNOSIS — Z888 Allergy status to other drugs, medicaments and biological substances status: Secondary | ICD-10-CM | POA: Diagnosis not present

## 2020-10-06 DIAGNOSIS — G4733 Obstructive sleep apnea (adult) (pediatric): Secondary | ICD-10-CM

## 2020-10-06 DIAGNOSIS — I200 Unstable angina: Secondary | ICD-10-CM | POA: Diagnosis present

## 2020-10-06 DIAGNOSIS — Z79899 Other long term (current) drug therapy: Secondary | ICD-10-CM | POA: Diagnosis not present

## 2020-10-06 DIAGNOSIS — Z8616 Personal history of COVID-19: Secondary | ICD-10-CM | POA: Insufficient documentation

## 2020-10-06 DIAGNOSIS — Z885 Allergy status to narcotic agent status: Secondary | ICD-10-CM | POA: Diagnosis not present

## 2020-10-06 DIAGNOSIS — I25708 Atherosclerosis of coronary artery bypass graft(s), unspecified, with other forms of angina pectoris: Secondary | ICD-10-CM

## 2020-10-06 DIAGNOSIS — I10 Essential (primary) hypertension: Secondary | ICD-10-CM | POA: Diagnosis not present

## 2020-10-06 DIAGNOSIS — E1142 Type 2 diabetes mellitus with diabetic polyneuropathy: Secondary | ICD-10-CM | POA: Insufficient documentation

## 2020-10-06 DIAGNOSIS — Z6841 Body Mass Index (BMI) 40.0 and over, adult: Secondary | ICD-10-CM | POA: Diagnosis not present

## 2020-10-06 DIAGNOSIS — E119 Type 2 diabetes mellitus without complications: Secondary | ICD-10-CM

## 2020-10-06 DIAGNOSIS — I2510 Atherosclerotic heart disease of native coronary artery without angina pectoris: Secondary | ICD-10-CM | POA: Diagnosis present

## 2020-10-06 DIAGNOSIS — Z9981 Dependence on supplemental oxygen: Secondary | ICD-10-CM | POA: Insufficient documentation

## 2020-10-06 DIAGNOSIS — Z8249 Family history of ischemic heart disease and other diseases of the circulatory system: Secondary | ICD-10-CM | POA: Insufficient documentation

## 2020-10-06 HISTORY — PX: RIGHT/LEFT HEART CATH AND CORONARY/GRAFT ANGIOGRAPHY: CATH118267

## 2020-10-06 LAB — POCT I-STAT EG7
Acid-Base Excess: 2 mmol/L (ref 0.0–2.0)
Bicarbonate: 28 mmol/L (ref 20.0–28.0)
Calcium, Ion: 1.21 mmol/L (ref 1.15–1.40)
HCT: 37 % (ref 36.0–46.0)
Hemoglobin: 12.6 g/dL (ref 12.0–15.0)
O2 Saturation: 73 %
Potassium: 3.4 mmol/L — ABNORMAL LOW (ref 3.5–5.1)
Sodium: 144 mmol/L (ref 135–145)
TCO2: 29 mmol/L (ref 22–32)
pCO2, Ven: 50.1 mmHg (ref 44.0–60.0)
pH, Ven: 7.355 (ref 7.250–7.430)
pO2, Ven: 41 mmHg (ref 32.0–45.0)

## 2020-10-06 LAB — POCT I-STAT 7, (LYTES, BLD GAS, ICA,H+H)
Acid-Base Excess: 1 mmol/L (ref 0.0–2.0)
Bicarbonate: 27.1 mmol/L (ref 20.0–28.0)
Calcium, Ion: 1.22 mmol/L (ref 1.15–1.40)
HCT: 37 % (ref 36.0–46.0)
Hemoglobin: 12.6 g/dL (ref 12.0–15.0)
O2 Saturation: 99 %
Potassium: 3.4 mmol/L — ABNORMAL LOW (ref 3.5–5.1)
Sodium: 144 mmol/L (ref 135–145)
TCO2: 29 mmol/L (ref 22–32)
pCO2 arterial: 46.3 mmHg (ref 32.0–48.0)
pH, Arterial: 7.376 (ref 7.350–7.450)
pO2, Arterial: 136 mmHg — ABNORMAL HIGH (ref 83.0–108.0)

## 2020-10-06 LAB — GLUCOSE, CAPILLARY: Glucose-Capillary: 165 mg/dL — ABNORMAL HIGH (ref 70–99)

## 2020-10-06 SURGERY — RIGHT/LEFT HEART CATH AND CORONARY/GRAFT ANGIOGRAPHY
Anesthesia: LOCAL

## 2020-10-06 MED ORDER — HEPARIN (PORCINE) IN NACL 1000-0.9 UT/500ML-% IV SOLN
INTRAVENOUS | Status: AC
  Filled 2020-10-06: qty 1500

## 2020-10-06 MED ORDER — HEPARIN (PORCINE) IN NACL 1000-0.9 UT/500ML-% IV SOLN
INTRAVENOUS | Status: DC | PRN
  Administered 2020-10-06: 500 mL

## 2020-10-06 MED ORDER — IOHEXOL 350 MG/ML SOLN
INTRAVENOUS | Status: AC
  Filled 2020-10-06: qty 1

## 2020-10-06 MED ORDER — HYDRALAZINE HCL 20 MG/ML IJ SOLN
INTRAMUSCULAR | Status: DC | PRN
  Administered 2020-10-06: 10 mg via INTRAVENOUS

## 2020-10-06 MED ORDER — ACETAMINOPHEN 325 MG PO TABS: 650.0000 mg | ORAL_TABLET | ORAL | Status: DC | PRN

## 2020-10-06 MED ORDER — HYDRALAZINE HCL 20 MG/ML IJ SOLN
INTRAMUSCULAR | Status: AC
  Filled 2020-10-06: qty 1

## 2020-10-06 MED ORDER — SODIUM CHLORIDE 0.9 % WEIGHT BASED INFUSION
3.0000 mL/kg/h | INTRAVENOUS | Status: AC
  Administered 2020-10-06: 3 mL/kg/h via INTRAVENOUS

## 2020-10-06 MED ORDER — MIDAZOLAM HCL 2 MG/2ML IJ SOLN
INTRAMUSCULAR | Status: DC | PRN
  Administered 2020-10-06: 1 mg via INTRAVENOUS

## 2020-10-06 MED ORDER — SODIUM CHLORIDE 0.9 % IV SOLN: 250.0000 mL | INTRAVENOUS | Status: DC | PRN

## 2020-10-06 MED ORDER — SODIUM CHLORIDE 0.9 % WEIGHT BASED INFUSION: 1.0000 mL/kg/h | INTRAVENOUS | Status: DC

## 2020-10-06 MED ORDER — MIDAZOLAM HCL 2 MG/2ML IJ SOLN
INTRAMUSCULAR | Status: AC
  Filled 2020-10-06: qty 2

## 2020-10-06 MED ORDER — IOHEXOL 350 MG/ML SOLN
INTRAVENOUS | Status: DC | PRN
  Administered 2020-10-06: 75 mL

## 2020-10-06 MED ORDER — SODIUM CHLORIDE 0.9% FLUSH: 3.0000 mL | INTRAVENOUS | Status: DC | PRN

## 2020-10-06 MED ORDER — HYDRALAZINE HCL 20 MG/ML IJ SOLN: 10.0000 mg | INTRAMUSCULAR | Status: DC | PRN

## 2020-10-06 MED ORDER — SODIUM CHLORIDE 0.9% FLUSH
3.0000 mL | INTRAVENOUS | Status: DC | PRN
Start: 2020-10-06 — End: 2020-10-06

## 2020-10-06 MED ORDER — ASPIRIN 81 MG PO CHEW: 81.0000 mg | CHEWABLE_TABLET | ORAL | Status: DC

## 2020-10-06 MED ORDER — SODIUM CHLORIDE 0.9% FLUSH: 3.0000 mL | Freq: Two times a day (BID) | INTRAVENOUS | Status: DC

## 2020-10-06 MED ORDER — LIDOCAINE HCL (PF) 1 % IJ SOLN
INTRAMUSCULAR | Status: AC
  Filled 2020-10-06: qty 30

## 2020-10-06 MED ORDER — LIDOCAINE HCL (PF) 1 % IJ SOLN
INTRAMUSCULAR | Status: DC | PRN
  Administered 2020-10-06: 20 mL

## 2020-10-06 SURGICAL SUPPLY — 13 items
CATH INFINITI 5FR AL1 (CATHETERS) ×1 IMPLANT
CATH INFINITI 5FR MULTPACK ANG (CATHETERS) ×1 IMPLANT
CATH SWAN GANZ 7F STRAIGHT (CATHETERS) ×1 IMPLANT
GUIDEWIRE .025 260CM (WIRE) ×1 IMPLANT
KIT HEART LEFT (KITS) ×2 IMPLANT
PACK CARDIAC CATHETERIZATION (CUSTOM PROCEDURE TRAY) ×2 IMPLANT
SHEATH PINNACLE 5F 10CM (SHEATH) ×1 IMPLANT
SHEATH PINNACLE 7F 10CM (SHEATH) ×1 IMPLANT
SHEATH PROBE COVER 6X72 (BAG) ×1 IMPLANT
TRANSDUCER W/STOPCOCK (MISCELLANEOUS) ×2 IMPLANT
TUBING CIL FLEX 10 FLL-RA (TUBING) ×2 IMPLANT
WIRE EMERALD 3MM-J .035X150CM (WIRE) ×1 IMPLANT
WIRE HI TORQ VERSACORE-J 145CM (WIRE) ×1 IMPLANT

## 2020-10-06 NOTE — Discharge Instructions (Signed)
Femoral Site Care  This sheet gives you information about how to care for yourself after your procedure. Your health care provider may also give you more specific instructions. If you have problems or questions, contact your health care provider. What can I expect after the procedure? After the procedure, it is common to have:  Bruising that usually fades within 1-2 weeks.  Tenderness at the site. Follow these instructions at home: Wound care  Follow instructions from your health care provider about how to take care of your insertion site. Make sure you: ? Wash your hands with soap and water before you change your bandage (dressing). If soap and water are not available, use hand sanitizer. ? Change your dressing as told by your health care provider. ? Leave stitches (sutures), skin glue, or adhesive strips in place. These skin closures may need to stay in place for 2 weeks or longer. If adhesive strip edges start to loosen and curl up, you may trim the loose edges. Do not remove adhesive strips completely unless your health care provider tells you to do that.  Do not take baths, swim, or use a hot tub until your health care provider approves.  You may shower 24-48 hours after the procedure or as told by your health care provider. ? Gently wash the site with plain soap and water. ? Pat the area dry with a clean towel. ? Do not rub the site. This may cause bleeding.  Do not apply powder or lotion to the site. Keep the site clean and dry.  Check your femoral site every day for signs of infection. Check for: ? Redness, swelling, or pain. ? Fluid or blood. ? Warmth. ? Pus or a bad smell. Activity  For the first 2-3 days after your procedure, or as long as directed: ? Avoid climbing stairs as much as possible. ? Do not squat.  Do not lift anything that is heavier than 10 lb (4.5 kg), or the limit that you are told, until your health care provider says that it is safe.  Rest as  directed. ? Avoid sitting for a long time without moving. Get up to take short walks every 1-2 hours.  Do not drive for 24 hours if you were given a medicine to help you relax (sedative). General instructions  Take over-the-counter and prescription medicines only as told by your health care provider.  Keep all follow-up visits as told by your health care provider. This is important. Contact a health care provider if you have:  A fever or chills.  You have redness, swelling, or pain around your insertion site. Get help right away if:  The catheter insertion area swells very fast.  You pass out.  You suddenly start to sweat or your skin gets clammy.  The catheter insertion area is bleeding, and the bleeding does not stop when you hold steady pressure on the area.  The area near or just beyond the catheter insertion site becomes pale, cool, tingly, or numb. These symptoms may represent a serious problem that is an emergency. Do not wait to see if the symptoms will go away. Get medical help right away. Call your local emergency services (911 in the [place] Do not drive yourself to the hospital. Summary  After the procedure, it is common to have bruising that usually fades within 1-2 weeks.  Check your femoral site every day for signs of infection.  Do not lift anything that is heavier than 10 lb (4.5 kg), or   the limit that you are told, until your health care provider says that it is safe. This information is not intended to replace advice given to you by your health care provider. Make sure you discuss any questions you have with your health care provider. Document Revised: 03/13/2020 Document Reviewed: 03/13/2020 Elsevier Patient Education  2021 Elsevier Inc.  

## 2020-10-06 NOTE — Research (Signed)
[place] Informed Consent   Subject Name: [name]  Subject met inclusion and exclusion criteria.  The informed consent form, study requirements and expectations were reviewed with the subject and questions and concerns were addressed prior to the signing of the consent form.  The subject verbalized understanding of the trail requirements.  The subject agreed to participate in the [facility] trial and signed the informed consent.  The informed consent was obtained prior to performance of any protocol-specific procedures for the subject.  A copy of the signed informed consent was given to the subject and a copy was placed in the subject's medical record.  Philemon Kingdom D 10/06/2020, 0805 am

## 2020-10-06 NOTE — Interval H&P Note (Signed)
History and Physical Interval Note:  10/06/2020 9:32 AM  [name]  has presented today for surgery, with the diagnosis of chest pain, shortness of breath.  The various methods of treatment have been discussed with the patient and family. After consideration of risks, benefits and other options for treatment, the patient has consented to  Procedure(s): RIGHT/LEFT HEART CATH AND CORONARY/GRAFT ANGIOGRAPHY (N/A) as a surgical intervention.  The patient's history has been reviewed, patient examined, no change in status, stable for surgery.  I have reviewed the patient's chart and labs.  Questions were answered to the patient's satisfaction.   Cath Lab Visit (complete for each Cath Lab visit)  Clinical Evaluation Leading to the Procedure:   ACS: Yes.    Non-ACS:    Anginal Classification: CCS III  Anti-ischemic medical therapy: Maximal Therapy (2 or more classes of medications)  Non-Invasive Test Results: No non-invasive testing performed  Prior CABG: Previous CABG        [name] [facility] 10/06/2020 9:33 AM

## 2020-10-06 NOTE — Progress Notes (Signed)
Site area: Rt fem art and rt fem vein Site Prior to Removal:  Level 0 Pressure Applied For: 25 min Manual:   Yes Patient Status During Pull:  A/O Post Pull Site:  Level 0 Post Pull Instructions Given:  Instructions given and pt understands Post Pull Pulses Present: 2+ rt pt/1+ rt dp Dressing Applied:  Tegaderm and a 4x4 Bedrest begins @ 11:00am Comments: Sheaths pulled by [employer].

## 2020-10-07 ENCOUNTER — Encounter (HOSPITAL_COMMUNITY): Payer: Self-pay | Admitting: Cardiology

## 2020-10-07 MED FILL — Heparin Sod (Porcine)-NaCl IV Soln 1000 Unit/500ML-0.9%: INTRAVENOUS | Qty: 500 | Status: AC

## 2020-10-19 NOTE — Progress Notes (Signed)
[name] Date of Birth: 07-Mar-1946   History of Present Illness: [name] is seen for follow up CAD.  [name] is s/p CABG in 2005 with LIMA to LAD and left radial graft to the first diagonal. Myoview in March 2018 was normal.  Echo in 2015 unremarkable. [name] was seen by Dr. Elsworth Soho and diagnosed with OSA by at home testing. [name] is currently using nocturnal oxygen. In 2018 [name] underwent lumbar laminectomy complicated by a dural tear that was repaired. Since that time [name] has continued to have back pain that limits her activity. When seen in 2019 complained of dyspnea. Echo showed moderate LVH but was otherwise normal. [name] had followup with pulmonary. PFTs showed moderate restriction.   [name] was admitted in August 2020 with Covid 19 PNA. Managed with oxygen therapy and steroids, Remdesivir, and Actemsa. [name] reports since [name] had Covid [name] is still on oxygen at night. States [name] doesn't know if [name]'ll ever get over Covid. Pulse ox.drops down to 87 at times.   When seen this month [name]  has not felt well. Still gets out of breath very easily. Has sweats with activity and also complains that her chest feels heavy. Worse with activity. Unclear on how long this lasts but more than a minute. Just rests until it goes away. Hasn't taken Ntg. To evaluate further [name] underwent cardiac cath showing patent LIMA to LAD and radial graft to OM. SVG to diagonal was occluded. Disease in distal RCA branches and diagonal not amenable to PCI. Based on findings it was felt symptoms were likely more pulmonary related or deconditioning. [name] reports no problems post cardiac cath.   Current Outpatient Medications on File Prior to Visit  Medication Sig Dispense Refill  . ACCU-CHEK GUIDE test strip     . acetaminophen (TYLENOL) 500 MG tablet Take 1,000 mg by mouth See admin instructions. Take 1000 mg every night, may take 1000 mg an additional 3 times as needed for pain    . albuterol (VENTOLIN HFA) 108 (90 Base) MCG/ACT  inhaler Inhale 2 puffs into the lungs every 4 (four) hours as needed for wheezing or shortness of breath (cough, shortness of breath or wheezing.). 6.7 g 0  . amLODipine (NORVASC) 5 MG tablet Take 5 mg by mouth in the morning.    . Ascorbic Acid (VITAMIN C PO) Take 600 mg by mouth daily.    Marland Kitchen aspirin EC 81 MG tablet Take 81 mg by mouth at bedtime. Swallow whole.    . b complex vitamins capsule Take 1 capsule by mouth daily.    . baclofen (LIORESAL) 10 MG tablet TAKE 1 TABLET BY MOUTH TWICE A DAY AS NEEDED FOR SPASMS (Patient taking differently: Take 10 mg by mouth 2 (two) times daily.) 180 tablet 0  . betamethasone dipropionate (DIPROLENE) 0.05 % ointment Apply 1 application topically daily as needed (eczema).    Marland Kitchen BIOTIN PO Take 500 mcg by mouth daily.    . blood glucose meter kit and supplies Dispense based on patient and insurance preference. Use up to four times daily as directed. (FOR ICD-10 E10.9, E11.9). 1 each 0  . buPROPion (WELLBUTRIN XL) 150 MG 24 hr tablet Take 150 mg by mouth in the morning.    . busPIRone (BUSPAR) 7.5 MG tablet Take 7.5 mg by mouth 2 (two) times daily as needed (anxiety).    . cholecalciferol (VITAMIN D3) 25 MCG (1000 UT) tablet Take 1,000 Units by mouth at bedtime.    . DULoxetine (  CYMBALTA) 60 MG capsule Take 60 mg by mouth in the morning.    . Echinacea 400 MG CAPS Take 400 mg by mouth daily.    Marland Kitchen ezetimibe (ZETIA) 10 MG tablet Take 1 tablet (10 mg total) by mouth daily. (Patient taking differently: Take 10 mg by mouth in the morning.) 30 tablet 0  . fluticasone (FLONASE) 50 MCG/ACT nasal spray Place 2 sprays into both nostrils 2 (two) times daily as needed for allergies.    . folic acid (FOLVITE) 1 MG tablet Take 2 tablets (2 mg total) by mouth daily. (Patient taking differently: Take 1 mg by mouth in the morning and at bedtime.) 180 tablet 4  . gabapentin (NEURONTIN) 300 MG capsule Take 600 mg by mouth 3 (three) times daily.     Marland Kitchen HYDROcodone-acetaminophen  (NORCO/VICODIN) 5-325 MG tablet Take 1 tablet by mouth every 6 (six) hours as needed (pain).    . hydroxychloroquine (PLAQUENIL) 200 MG tablet TAKE 1 TABLET TWICE DAILY FOR RHEUMATOID ARTHRITIS (Patient taking differently: Take 200 mg by mouth 2 (two) times daily.) 180 tablet 0  . loperamide (IMODIUM A-D) 2 MG tablet Take 2 mg by mouth 4 (four) times daily as needed for diarrhea or loose stools.    . meloxicam (MOBIC) 15 MG tablet Take 15 mg by mouth 2 (two) times a week. Wednesdays & Saturdays    . montelukast (SINGULAIR) 10 MG tablet Take 10 mg by mouth at bedtime.    . Multiple Vitamins-Minerals (CENTRUM SILVER PO) Take 1 tablet by mouth daily.    . Multiple Vitamins-Minerals (HAIR SKIN NAILS PO) Take 1 tablet by mouth daily.    . Multiple Vitamins-Minerals (ZINC PO) Take 1 tablet by mouth daily.    . mupirocin ointment (BACTROBAN) 2 % Apply 1 application topically daily.    . nebivolol (BYSTOLIC) 10 MG tablet Take 20 mg by mouth every evening.    . nitroGLYCERIN (NITROSTAT) 0.4 MG SL tablet nitroglycerin 0.4 mg sublingual tablet (Patient taking differently: Place 0.4 mg under the tongue every 5 (five) minutes as needed for chest pain.) 25 tablet 11  . nystatin (MYCOSTATIN) 100000 UNIT/ML suspension Use as directed 5 mLs in the mouth or throat 2 (two) times daily as needed (thrush).    Marland Kitchen olmesartan-hydrochlorothiazide (BENICAR HCT) 40-25 MG tablet Take 1 tablet by mouth in the morning.  5  . omeprazole (PRILOSEC) 20 MG capsule Take 20 mg by mouth in the morning.    Marland Kitchen OVER THE COUNTER MEDICATION Take 1 application by mouth daily as needed (wound care). Swedish bitters    . OVER THE COUNTER MEDICATION Take 1 capsule by mouth daily. Cbd supplement    . OXYGEN in 2021 wears each night and prn during the day    . Potassium 99 MG TABS Take 99 mg by mouth daily.    . Probiotic Product (PROBIOTIC DAILY PO) Take 1 capsule by mouth daily.    . psyllium (REGULOID) 0.52 g capsule Take 1.04 g by mouth 2  (two) times daily.    Marland Kitchen TART CHERRY PO Take 480 mg by mouth 2 (two) times daily.     Current Facility-Administered Medications on File Prior to Visit  Medication Dose Route Frequency Provider Last Rate Last Admin  . betamethasone acetate-betamethasone sodium phosphate (CELESTONE) injection 6 mg  6 mg Other Once Evelina Bucy, DPM      . sodium chloride flush (NS) 0.9 % injection 3 mL  3 mL Intravenous Q12H Martinique, Sheamus Hasting M, MD  Allergies  Allergen Reactions  . Statins Other (See Comments)    Joint pain and swelling/burning  . Alirocumab Other (See Comments)    (praluent) all sorts of side effects. strange.  [name] had burning, weakness, felt joints hurt more, felt asthma flared.  . Aricept [Donepezil]     leg pains  . Atorvastatin     Leg pain  . Crestor [Rosuvastatin]     Other reaction(s): even 1/2 of 5 mg 2-3 days a week couldn't take due to muscle side effects.  . Diazepam     "makes me crazy"  . Dry Skin Treatment [Albolene]     Other reaction(s): contact dermatitis when in covid hospital  . Evolocumab     Other reaction(s): felt like going to pass out.legs were weak.  . Ezetimibe     leg cramps  . Fenofibrate Micronized     cramps  . Fentanyl     Unknown reaction  . Loratadine     leg cramps  . Metformin     upset stomach  . Morphine And Related Other (See Comments)    hallucinations  . Niaspan [Niacin]     burning  . Omeprazole     stomach problems  . Phentermine-Topiramate     Other reaction(s): just didn't work for her.  Earnestine Mealing [Colesevelam]     Leg cramps  . Zocor [Simvastatin]     muscle aches  . Azithromycin Rash    Legs burning     Past Medical History:  Diagnosis Date  . Actinic keratosis   . Asthma    Albuterol inhaler as needed.Pulmicort neb as needed  . Asthma   . Breast cancer ([place])    right - lumpectomy   . Chronic back pain    spinal stenosis  . Coronary artery disease   . Dependence on nocturnal oxygen therapy 07/10/2017    Pt uses 2 liters 02 at night   . Depression    takes CYmbalta and Wellbutrin daily  . Diabetes mellitus    not on any meds/controlled by diet  . Dyspnea    with exertion occasionally when lies down  . GERD (gastroesophageal reflux disease)    takes Omeprazole daily  . Headache    oocasionally  . History of kidney stones   . Hyperlipidemia    not on any meds  . Hypertension    takes Benazepril,Bystolic,and Amlodipine daily  . IBS (irritable bowel syndrome)   . Joint pain   . Joint swelling   . Livedoid vasculitis   . Nocturia   . OA (osteoarthritis) of knee   . OSA (obstructive sleep apnea)   . Other seborrheic keratosis   . Oxygen deficiency    2 liters at night per pt for OSA- no cpap   . Peripheral edema    takes daily as needed  . Peripheral neuropathy   . Personal history of chemotherapy   . Personal history of radiation therapy   . Pneumonia    hx of-several yrs ago  . RA (rheumatoid arthritis) ([place])   . Sleep apnea    pt states [name] uses oxygen at night - no cpap- uses 02 2 liters at night   . Urinary frequency   . Urinary urgency   . Weakness    numbness and tingling mainly in left leg occasionally in right.Tingling/numbness in hands    Past Surgical History:  Procedure Laterality Date  . BREAST LUMPECTOMY  1997   RIGHT BREAST  .  CARDIAC CATHETERIZATION  04/23/2004   EF 60%  . cataract surgery Bilateral   . COLONOSCOPY    . COLONOSCOPY WITH PROPOFOL N/A 08/07/2017   Procedure: COLONOSCOPY WITH PROPOFOL;  Surgeon: Ladene Artist, MD;  Location: [facility];  Service: Endoscopy;  Laterality: N/A;  . CORONARY ARTERY BYPASS GRAFT  2005   LIMA GRAFT TO LAD, SAPHENOUS VEIN GRAFT TO THE FIRST DIAGONAL, AND LEFT RADIAL ARTERY GRAFT TO THE OM  . ESOPHAGOGASTRODUODENOSCOPY (EGD) WITH PROPOFOL N/A 08/07/2017   Procedure: ESOPHAGOGASTRODUODENOSCOPY (EGD) WITH PROPOFOL;  Surgeon: Ladene Artist, MD;  Location: [facility];  Service: Endoscopy;  Laterality: N/A;  .  FOOT SURGERY Right   . JOINT REPLACEMENT    . LITHOTRIPSY    . LUMBAR LAMINECTOMY/DECOMPRESSION MICRODISCECTOMY N/A 09/08/2015   Procedure: CENTRAL DECOMPRESSIVE LUMBAR LAMINECTOMIES L3-4, L4-5 AND BILATERAL HEMILAMINECTOMY L5-S1;  Surgeon: Jessy Oto, MD;  Location: [facility];  Service: Orthopedics;  Laterality: N/A;  . nodule removed from left elbow    . RIGHT/LEFT HEART CATH AND CORONARY/GRAFT ANGIOGRAPHY N/A 10/06/2020   Procedure: RIGHT/LEFT HEART CATH AND CORONARY/GRAFT ANGIOGRAPHY;  Surgeon: Martinique, Shahram Alexopoulos M, MD;  Location: [facility];  Service: Cardiovascular;  Laterality: N/A;  . SHOULDER ARTHROSCOPY    . TOTAL KNEE ARTHROPLASTY Bilateral   . TOTAL SHOULDER ARTHROPLASTY    . TRIPLE BYPASS  04/27/05  . US ECHOCARDIOGRAPHY  01/06/2009   EF 55-60%  . WRIST SURGERY Left     Social History   Tobacco Use  Smoking Status Never Smoker  Smokeless Tobacco Never Used    Social History   Substance and Sexual Activity  Alcohol Use No  . Alcohol/week: 0.0 standard drinks    Family History  Problem Relation Age of Onset  . Heart disease Mother   . Cancer Father   . Cancer Brother   . Heart disease Brother     Review of Systems: As noted history of present illness.  All other systems were reviewed and are negative.  Physical Exam: BP (!) 142/78 (BP Location: Left Arm, Patient Position: Sitting, Cuff Size: Large)   Pulse 60   Ht 5' 4"  (1.626 m)   Wt 242 lb 12.8 oz (110.1 kg)   SpO2 95%   BMI 41.68 kg/m  GENERAL:  Well appearing obese WF uses a cane to walk. HEENT:  PERRL, EOMI, sclera are clear. Oropharynx is clear. NECK:  No jugular venous distention, carotid upstroke brisk and symmetric, no bruits, no thyromegaly or adenopathy LUNGS:  Clear to auscultation bilaterally CHEST:  Unremarkable HEART:  RRR,  PMI not displaced or sustained,S1 and S2 within normal limits, no S3, no S4: no clicks, no rubs, no murmurs ABD:  Soft, nontender. BS +, no masses or bruits. No  hepatomegaly, no splenomegaly EXT:  2 + pulses throughout, no edema, no cyanosis no clubbing. Old surgical scar left wrist from radial harvest.  SKIN:  Warm and dry.  No rashes NEURO:  Alert and oriented x 3. Cranial nerves II through XII intact. PSYCH:  Cognitively intact    LABORATORY DATA:  Lab Results  Component Value Date   WBC 7.7 10/02/2020   HGB 12.6 10/06/2020   HCT 37.0 10/06/2020   PLT 185 10/02/2020   GLUCOSE 144 (H) 10/02/2020   CHOL 210 (H) 10/02/2020   TRIG 188 (H) 10/02/2020   HDL 52 10/02/2020   LDLCALC 125 (H) 10/02/2020   ALT 27 10/02/2020   AST 25 10/02/2020   NA 144 10/06/2020  K 3.4 (L) 10/06/2020   CL 102 10/02/2020   CREATININE 0.58 10/02/2020   BUN 10 10/02/2020   CO2 24 10/02/2020   TSH 2.93 07/28/2017   INR 1.0 10/02/2020   HGBA1C 8.1 (H) 02/26/2019   Labs dated 08/02/16: cholesterol 286, triglycerides 181, HDL 52, LDL 198, A1c 6.5%. Dated 08/25/17: cholesterol 257, triglycerides 121, HDL 59, LDL 174. A1c 7.6%. Normal chemistries, TSH, Hgb.  Dated 07/03/19: cholesterol 192, triglycerides 159, HDL 45, LDL 115. A1c 6.0%.  Dated 11/19/19: cholesterol 195, triglycerides 126, HDL 48, LDL 120.  Dated 08/18/20: A1c 7%.    Echo: 2/16/15Study Conclusions  - Left ventricle: The cavity size was normal. Wall thickness was normal. Systolic function was normal. The estimated ejection fraction was in the range of 50% to 55%. - Mitral valve: Nodular calcification of anterior leaflet tip Mild regurgitation. - Left atrium: The atrium was moderately dilated. - Atrial septum: No defect or patent foramen ovale was identified. - Pericardium, extracardiac: A trivial pericardial effusion was identified posterior to the heart. - Impressions: Elevated E/E' ratio suggested diastolic dysfunction with elevated EDP Impressions:  - Elevated E/E' ratio suggested diastolic dysfunction with elevated EDP   Myoview 09/29/16: Study Highlights     The left  ventricular ejection fraction is mildly decreased (45-54%).  Nuclear stress EF: 51%.  There was no ST segment deviation noted during stress.  The study is normal.  This is a low risk study.   Normal stress nuclear study with no ischemia or infarction; EF 51 with normal wall motion; cannot R/O left breast mass; suggest mammogram if not performed recently.    Echo 09/20/17: Study Conclusions   - Left ventricle: Wall thickness was increased in a pattern of  moderate LVH. Systolic function was normal. The estimated  ejection fraction was in the range of 55% to 60%. Features are  consistent with a pseudonormal left ventricular filling pattern,  with concomitant abnormal relaxation and increased filling  pressure (grade 2 diastolic dysfunction).   Cardiac cath 10/06/20:  RIGHT/LEFT HEART CATH AND CORONARY/GRAFT ANGIOGRAPHY    Conclusion    Prox LAD to Mid LAD lesion is 100% stenosed.  1st Diag lesion is 80% stenosed.  1st Mrg lesion is 100% stenosed.  1st Sept lesion is 90% stenosed.  RPDA lesion is 90% stenosed.  2nd RPL lesion is 70% stenosed.  RPAV lesion is 50% stenosed.  SVG graft was visualized by angiography.  Origin to Prox Graft lesion is 100% stenosed.  Left radial artery graft was visualized by angiography and is normal in caliber.  LIMA graft was visualized by non-selective angiography and is normal in caliber.  The left ventricular systolic function is normal.  LV end diastolic pressure is normal.  The left ventricular ejection fraction is 55-65% by visual estimate.  Hemodynamic findings consistent with mild pulmonary hypertension.   1. 3 vessel obstructive CAD    - 100% mid LAD    - 80% first diagonal, 90% first septal perforator    - 100% OM1    - 90% mid PDA, 70% PLOM2 2. Patent LIMA to the LAD 3. Patent radial artery graft to the OM1 4. Occluded SVG to diagonal 5. Normal LV function 6. Normal LV filling pressures mean PCWP 12  mm Hg 7. Mild pulmonary HTN with PA mean pressure 25 mm Hg 8. Normal cardiac output.  Plan: recommend continued medical therapy. While the diagonal and PDA could be causing some ischemia these vessels are relatively small and not ideal for  PCI. I suspect based on these findings that most of her symptoms are more related to pulmonary disease and deconditioning/obesity.   Coronary Diagrams   Diagnostic Dominance: Right    Intervention  Hemo Data  Flowsheet Row Most Recent Value  Fick Cardiac Output 5.89 L/min  Fick Cardiac Output Index 2.77 (L/min)/BSA  RA A Wave 9 mmHg  RA V Wave 6 mmHg  RA Mean 4 mmHg  RV Systolic Pressure 42 mmHg  RV Diastolic Pressure 1 mmHg  RV EDP 8 mmHg  PA Systolic Pressure 44 mmHg  PA Diastolic Pressure 9 mmHg  PA Mean 25 mmHg  PW A Wave 17 mmHg  PW V Wave 17 mmHg  PW Mean 12 mmHg  AO Systolic Pressure 124 mmHg  AO Diastolic Pressure 69 mmHg  AO Mean 580 mmHg  LV Systolic Pressure 998 mmHg  LV Diastolic Pressure 13 mmHg  LV EDP 24 mmHg  AOp Systolic Pressure 338 mmHg  AOp Diastolic Pressure 75 mmHg  AOp Mean Pressure 250 mmHg  LVp Systolic Pressure 539 mmHg  LVp Diastolic Pressure 11 mmHg  LVp EDP Pressure 24 mmHg  QP/QS 1  TPVR Index 9.05 HRUI  TSVR Index 37.27 HRUI  PVR SVR Ratio 0.13  TPVR/TSVR Ratio 0.24      Assessment / Plan: 1. Coronary disease.  [name] is status post CABG in 2005. Nuclear stress test in March 2018 was normal.  [name] Is on optimal medical therapy.  Continue Bystolic and amlodipine. On ASA. Recent cardiac cath showed occlusion of SVG to diagonal with patent LIMA to LAD and radial graft to OM. Mild pulmonary HTN with normal LV filling pressures.   2. Hypertension, well controlled. Continue current medication  3. Hyperlipidemia.  Statin intolerant. Now only on Zetia. LDL improved from 174>>120. Will update labs now. Goal LDL < 70. [name] has appointment in our lipid clinic in April to look at treatment options.   4.  Diabetes mellitus type 2.  5. Morbid obesity with OSA  6. S/p Covid 19 infection. Persistent oxygen requirement.   7. Dyspne on exertion. Multifactorial but no evidence of CHF with recent hemodynamics. Has an appointment with Dr Elsworth Soho in April for pulmonary evaluation.

## 2020-10-22 ENCOUNTER — Encounter: Payer: Self-pay | Admitting: Cardiology

## 2020-10-22 ENCOUNTER — Other Ambulatory Visit: Payer: Self-pay

## 2020-10-22 ENCOUNTER — Ambulatory Visit: Payer: Medicare HMO | Admitting: Cardiology

## 2020-10-22 VITALS — BP 142/78 | HR 60 | Ht 64.0 in | Wt 242.8 lb

## 2020-10-22 DIAGNOSIS — R0602 Shortness of breath: Secondary | ICD-10-CM | POA: Diagnosis not present

## 2020-10-22 DIAGNOSIS — K219 Gastro-esophageal reflux disease without esophagitis: Secondary | ICD-10-CM | POA: Diagnosis not present

## 2020-10-22 DIAGNOSIS — I25708 Atherosclerosis of coronary artery bypass graft(s), unspecified, with other forms of angina pectoris: Secondary | ICD-10-CM | POA: Diagnosis not present

## 2020-10-22 DIAGNOSIS — E7800 Pure hypercholesterolemia, unspecified: Secondary | ICD-10-CM

## 2020-10-22 DIAGNOSIS — I739 Peripheral vascular disease, unspecified: Secondary | ICD-10-CM

## 2020-10-22 DIAGNOSIS — E1151 Type 2 diabetes mellitus with diabetic peripheral angiopathy without gangrene: Secondary | ICD-10-CM | POA: Diagnosis not present

## 2020-10-22 DIAGNOSIS — E785 Hyperlipidemia, unspecified: Secondary | ICD-10-CM | POA: Diagnosis not present

## 2020-10-22 DIAGNOSIS — I10 Essential (primary) hypertension: Secondary | ICD-10-CM | POA: Diagnosis not present

## 2020-10-26 DIAGNOSIS — E119 Type 2 diabetes mellitus without complications: Secondary | ICD-10-CM | POA: Diagnosis not present

## 2020-10-26 DIAGNOSIS — Z79899 Other long term (current) drug therapy: Secondary | ICD-10-CM | POA: Diagnosis not present

## 2020-10-26 DIAGNOSIS — H35372 Puckering of macula, left eye: Secondary | ICD-10-CM | POA: Diagnosis not present

## 2020-10-26 DIAGNOSIS — H04123 Dry eye syndrome of bilateral lacrimal glands: Secondary | ICD-10-CM | POA: Diagnosis not present

## 2020-10-29 DIAGNOSIS — Z79899 Other long term (current) drug therapy: Secondary | ICD-10-CM | POA: Diagnosis not present

## 2020-10-29 DIAGNOSIS — M47812 Spondylosis without myelopathy or radiculopathy, cervical region: Secondary | ICD-10-CM | POA: Diagnosis not present

## 2020-10-29 DIAGNOSIS — M47894 Other spondylosis, thoracic region: Secondary | ICD-10-CM | POA: Diagnosis not present

## 2020-10-29 DIAGNOSIS — M5030 Other cervical disc degeneration, unspecified cervical region: Secondary | ICD-10-CM | POA: Diagnosis not present

## 2020-10-29 DIAGNOSIS — M069 Rheumatoid arthritis, unspecified: Secondary | ICD-10-CM | POA: Diagnosis not present

## 2020-11-03 ENCOUNTER — Other Ambulatory Visit: Payer: Self-pay

## 2020-11-03 ENCOUNTER — Ambulatory Visit: Payer: Medicare HMO | Admitting: Pharmacist

## 2020-11-03 VITALS — BP 130/70 | Resp 15 | Ht 64.0 in | Wt 237.2 lb

## 2020-11-03 DIAGNOSIS — U071 COVID-19: Secondary | ICD-10-CM | POA: Diagnosis not present

## 2020-11-03 DIAGNOSIS — E7800 Pure hypercholesterolemia, unspecified: Secondary | ICD-10-CM

## 2020-11-03 DIAGNOSIS — I2510 Atherosclerotic heart disease of native coronary artery without angina pectoris: Secondary | ICD-10-CM

## 2020-11-03 DIAGNOSIS — R0602 Shortness of breath: Secondary | ICD-10-CM | POA: Diagnosis not present

## 2020-11-03 DIAGNOSIS — J1289 Other viral pneumonia: Secondary | ICD-10-CM | POA: Diagnosis not present

## 2020-11-03 DIAGNOSIS — J9621 Acute and chronic respiratory failure with hypoxia: Secondary | ICD-10-CM | POA: Diagnosis not present

## 2020-11-03 NOTE — Patient Instructions (Addendum)
Your Results:             Your most recent labs Goal  Total Cholesterol 210 < 200  Triglycerides 188 < 150  HDL (good cholesterol) 52 > 40  LDL (bad cholesterol) 125 < 70     Medication changes: *Continue taking ezetimibe 10mg  daily*  *Start Nexletol 180mg  daily   Lab orders:  Uric acid and Scr (blood work) 10-14 days after [employer] dose  Clinic phone number: [phone] Kaveon Blatz/Kristin/Haliegh  Patient Assistance:  The [employer] foundation offers assistance to help pay for medication copays.  They will cover copays for all cholesterol lowering meds, including statins, fibrates, omega-3 oils, ezetimibe, Repatha, Praluent, Nexletol, Nexlizet.  The cards are usually good for $2,500 or 12 months, whichever comes first. 1. Go to healthwellfoundation.org 2. Click on "Apply Now" 3. Answer questions as to whom is applying (patient or representative) 4. Your disease fund will be "hypercholesterolemia - Medicare access" 5. They will ask questions about finances and which medications you are taking for cholesterol 6. When you submit, the approval is usually within minutes.  You will need to print the card information from the site 7. You will need to show this information to your pharmacy, they will bill your Medicare Part D plan first -then bill [employer] --for the copay.   You can also call them at [phone], although the hold times can be quite long.   Thank you for choosing [facility]

## 2020-11-03 NOTE — Progress Notes (Signed)
Patient ID: [name]                 DOB: [birth year]                    MRN: [id number]     HPI: [name] is a 75 y.o. female patient referred to lipid clinic by Dr Martinique. PMH is significant for hyperlipidemia, CAD s/p CABG x 3 in 2005, OSA currently on nocturnal oxygen, lumbar laminectomy, chronic back pain, COVID pneumonia on Aug/2020, neuropathies, rheumatoid arthritis and osteoarthitis. Noted documented reaction/allergies to 19 medications including Welcol, atorvastatin, rosuvastatin, ezetimibe, Reptha, Praluent, niaspan, and simvastatin.  She presents to appointment accompany by her daughter. Noted patient needs cane assistance to ambulate. Daughter is an Research scientist (life sciences), and expresses interest on discussing Nexletol therapy.   Current Medications:  Ezetimibe 48m daily  Intolerances: Rosuvastatin - muscle side effects Atorvastatin - leg pain Simvastatin - leg pain Ezetimibe - leg cramps , but still using every day Repatha - weak legs, feeling like was going to pass out Praluent - asthma flair up, and joint pain Niaspan - burning Welchol - leg cramp  LDL goal: <772mdL  Diet: no special diet, and not willing to change.   Exercise: activity of daily living  Family History: heart disease in mother and brother, cancer in father and brother  Social History: denies alcohol and tobacco use  Labs: 10/02/2020: CHO 210, TG 188, HDL 52, LDL-c 125 (ezetimibe 1043maily)  Past Medical History:  Diagnosis Date  . Actinic keratosis   . Asthma    Albuterol inhaler as needed.Pulmicort neb as needed  . Asthma   . [place] - lumpectomy   . Chronic back pain    spinal stenosis  . Coronary artery disease   . Dependence on nocturnal oxygen therapy 07/10/2017   Pt uses 2 liters 02 at night   . Depression    takes CYmbalta and Wellbutrin daily  . Diabetes mellitus    not on any meds/controlled by diet  . Dyspnea    with exertion occasionally when lies  down  . GERD (gastroesophageal reflux disease)    takes Omeprazole daily  . Headache    oocasionally  . History of kidney stones   . Hyperlipidemia    not on any meds  . Hypertension    takes Benazepril,Bystolic,and Amlodipine daily  . IBS (irritable bowel syndrome)   . Joint pain   . Joint swelling   . Livedoid vasculitis   . Nocturia   . OA (osteoarthritis) of knee   . OSA (obstructive sleep apnea)   . Other seborrheic keratosis   . Oxygen deficiency    2 liters at night per pt for OSA- no cpap   . Peripheral edema    takes daily as needed  . Peripheral neuropathy   . Personal history of chemotherapy   . Personal history of radiation therapy   . Pneumonia    hx of-several yrs ago  . RA (r[place]umatoid arthritis) ([place] . Sleep apnea    pt states she uses oxygen at night - no cpap- uses 02 2 liters at night   . Urinary frequency   . Urinary urgency   . Weakness    numbness and tingling mainly in left leg occasionally in right.Tingling/numbness in hands    Current Outpatient Medications on File Prior to Visit  Medication Sig Dispense Refill  . ACCU-CHEK GUIDE test strip     .  acetaminophen (TYLENOL) 500 MG tablet Take 1,000 mg by mouth See admin instructions. Take 1000 mg every night, may take 1000 mg an additional 3 times as needed for pain    . albuterol (VENTOLIN HFA) 108 (90 Base) MCG/ACT inhaler Inhale 2 puffs into the lungs every 4 (four) hours as needed for wheezing or shortness of breath (cough, shortness of breath or wheezing.). 6.7 g 0  . amLODipine (NORVASC) 5 MG tablet Take 5 mg by mouth in the morning.    . Ascorbic Acid (VITAMIN C PO) Take 600 mg by mouth daily.    Marland Kitchen aspirin EC 81 MG tablet Take 81 mg by mouth at bedtime. Swallow whole.    . b complex vitamins capsule Take 1 capsule by mouth daily.    . baclofen (LIORESAL) 10 MG tablet TAKE 1 TABLET BY MOUTH TWICE A DAY AS NEEDED FOR SPASMS (Patient taking differently: Take 10 mg by mouth 2 (two) times daily.)  180 tablet 0  . betamethasone dipropionate (DIPROLENE) 0.05 % ointment Apply 1 application topically daily as needed (eczema).    Marland Kitchen BIOTIN PO Take 500 mcg by mouth daily.    . blood glucose meter kit and supplies Dispense based on patient and insurance preference. Use up to four times daily as directed. (FOR ICD-10 E10.9, E11.9). 1 each 0  . buPROPion (WELLBUTRIN XL) 150 MG 24 hr tablet Take 150 mg by mouth in the morning.    . busPIRone (BUSPAR) 7.5 MG tablet Take 7.5 mg by mouth 2 (two) times daily as needed (anxiety).    . cholecalciferol (VITAMIN D3) 25 MCG (1000 UT) tablet Take 1,000 Units by mouth at bedtime.    . clobetasol cream (TEMOVATE) 0.86 % Apply 1 application topically 2 (two) times daily.    . DULoxetine (CYMBALTA) 60 MG capsule Take 60 mg by mouth in the morning.    . Echinacea 400 MG CAPS Take 400 mg by mouth daily.    Marland Kitchen ezetimibe (ZETIA) 10 MG tablet Take 1 tablet (10 mg total) by mouth daily. (Patient taking differently: Take 10 mg by mouth in the morning.) 30 tablet 0  . fluticasone (FLONASE) 50 MCG/ACT nasal spray Place 2 sprays into both nostrils 2 (two) times daily as needed for allergies.    Marland Kitchen gabapentin (NEURONTIN) 300 MG capsule Take 600 mg by mouth 3 (three) times daily.     Marland Kitchen HYDROcodone-acetaminophen (NORCO/VICODIN) 5-325 MG tablet Take 1 tablet by mouth every 6 (six) hours as needed (pain).    . hydroxychloroquine (PLAQUENIL) 200 MG tablet TAKE 1 TABLET TWICE DAILY FOR RHEUMATOID ARTHRITIS (Patient taking differently: Take 200 mg by mouth 2 (two) times daily.) 180 tablet 0  . loperamide (IMODIUM A-D) 2 MG tablet Take 2 mg by mouth 4 (four) times daily as needed for diarrhea or loose stools.    . meloxicam (MOBIC) 15 MG tablet Take 15 mg by mouth 2 (two) times a week. Wednesdays & Saturdays    . montelukast (SINGULAIR) 10 MG tablet Take 10 mg by mouth at bedtime.    . Multiple Vitamins-Minerals (CENTRUM SILVER PO) Take 1 tablet by mouth daily.    . Multiple  Vitamins-Minerals (HAIR SKIN NAILS PO) Take 1 tablet by mouth daily.    . Multiple Vitamins-Minerals (ZINC PO) Take 1 tablet by mouth daily.    . mupirocin ointment (BACTROBAN) 2 % Apply 1 application topically daily.    . nebivolol (BYSTOLIC) 10 MG tablet Take 20 mg by mouth every evening.    Marland Kitchen  nitroGLYCERIN (NITROSTAT) 0.4 MG SL tablet nitroglycerin 0.4 mg sublingual tablet (Patient taking differently: Place 0.4 mg under the tongue every 5 (five) minutes as needed for chest pain.) 25 tablet 11  . nystatin (MYCOSTATIN) 100000 UNIT/ML suspension Use as directed 5 mLs in the mouth or throat 2 (two) times daily as needed (thrush).    Marland Kitchen olmesartan-hydrochlorothiazide (BENICAR HCT) 40-25 MG tablet Take 1 tablet by mouth in the morning.  5  . omeprazole (PRILOSEC) 20 MG capsule Take 20 mg by mouth in the morning.    Marland Kitchen OVER THE COUNTER MEDICATION Take 1 application by mouth daily as needed (wound care). Swedish bitters    . OVER THE COUNTER MEDICATION Take 1 capsule by mouth daily. Cbd supplement    . OXYGEN in 2021 wears each night and prn during the day    . Potassium 99 MG TABS Take 99 mg by mouth daily.    . Probiotic Product (PROBIOTIC DAILY PO) Take 1 capsule by mouth daily.    . psyllium (REGULOID) 0.52 g capsule Take 1.04 g by mouth 2 (two) times daily.    Marland Kitchen TART CHERRY PO Take 480 mg by mouth 2 (two) times daily.    . folic acid (FOLVITE) 1 MG tablet Take 2 tablets (2 mg total) by mouth daily. (Patient taking differently: Take 1 mg by mouth in the morning and at bedtime.) 180 tablet 4   Current Facility-Administered Medications on File Prior to Visit  Medication Dose Route Frequency Provider Last Rate Last Admin  . sodium chloride flush (NS) 0.9 % injection 3 mL  3 mL Intravenous Q12H Martinique, Peter M, MD        Allergies  Allergen Reactions  . Statins Other (See Comments)    Joint pain and swelling/burning  . Alirocumab Other (See Comments)    (praluent) all sorts of side effects.  strange.  she had burning, weakness, felt joints hurt more, felt asthma flared.  . Aricept [Donepezil]     leg pains  . Atorvastatin     Leg pain  . Crestor [Rosuvastatin]     Other reaction(s): even 1/2 of 5 mg 2-3 days a week couldn't take due to muscle side effects.  . Diazepam     "makes me crazy"  . Dry Skin Treatment [Albolene]     Other reaction(s): contact dermatitis when in covid hospital  . Evolocumab     Other reaction(s): felt like going to pass out.legs were weak.  . Ezetimibe     leg cramps  . Fenofibrate Micronized     cramps  . Fentanyl     Unknown reaction  . Loratadine     leg cramps  . Metformin     upset stomach  . Morphine And Related Other (See Comments)    hallucinations  . Niaspan [Niacin]     burning  . Phentermine-Topiramate     Other reaction(s): just didn't work for her.  Earnestine Mealing [Colesevelam]     Leg cramps  . Zocor [Simvastatin]     muscle aches  . Azithromycin Rash    Legs burning     Hyperlipidemia LDL remains well above desired goal for secondary prevention. Patient reports intolerance to ALL previous lipid management therapy. Option for referral to lipid specialist discusses, but patient and daughter don't see advantage of additional referral, and not willing to use Juxtapid or participate on clinical trial.  We discussed Leqvio and Nexletol therapy options. Patient and daughter agree on trial with Nexletol.  Will like to try samples and repeat BMET plus uric acid level 10-14 days into therapy. If able to tolerate therapy, we will submit prior-authorization for Nexlizet and combine 2 medication (bempedoic acid plus ezetimibe) in one tablet. Patient  Is NOT interested in Leqvio injections at this time.  Immanuel Fedak Rodriguez-Guzman PharmD, BCPS, [place] [address] [place] [id number] 11/09/2020 1:30 PM

## 2020-11-09 ENCOUNTER — Encounter: Payer: Self-pay | Admitting: Pharmacist

## 2020-11-09 MED ORDER — NEXLETOL 180 MG PO TABS: 180.0000 mg | ORAL_TABLET | Freq: Every day | ORAL | 0 refills | Status: DC

## 2020-11-09 NOTE — Assessment & Plan Note (Signed)
LDL remains well above desired goal for secondary prevention. Patient reports intolerance to ALL previous lipid management therapy. Option for referral to lipid specialist discusses, but patient and daughter don't see advantage of additional referral, and not willing to use Juxtapid or participate on clinical trial.  We discussed Leqvio and Nexletol therapy options. Patient and daughter agree on trial with Nexletol. Will like to try samples and repeat BMET plus uric acid level 10-14 days into therapy. If able to tolerate therapy, we will submit prior-authorization for Nexlizet and combine 2 medication (bempedoic acid plus ezetimibe) in one tablet. Patient  Is NOT interested in Leqvio injections at this time.

## 2020-11-10 ENCOUNTER — Encounter: Payer: Self-pay | Admitting: Pulmonary Disease

## 2020-11-10 ENCOUNTER — Ambulatory Visit: Payer: Medicare HMO | Admitting: Pulmonary Disease

## 2020-11-10 ENCOUNTER — Other Ambulatory Visit: Payer: Self-pay

## 2020-11-10 VITALS — BP 150/80 | HR 54 | Temp 97.9°F | Ht 64.0 in | Wt 240.6 lb

## 2020-11-10 DIAGNOSIS — R0600 Dyspnea, unspecified: Secondary | ICD-10-CM

## 2020-11-10 DIAGNOSIS — J849 Interstitial pulmonary disease, unspecified: Secondary | ICD-10-CM

## 2020-11-10 DIAGNOSIS — G4733 Obstructive sleep apnea (adult) (pediatric): Secondary | ICD-10-CM | POA: Diagnosis not present

## 2020-11-10 DIAGNOSIS — R0609 Other forms of dyspnea: Secondary | ICD-10-CM

## 2020-11-10 NOTE — Patient Instructions (Signed)
Referral to pulm rehab at [facility] HRCT chest for fibrosis Schedule pFTs

## 2020-11-10 NOTE — Progress Notes (Signed)
   Subjective:    Patient ID: [name], female    DOB: [birth year], 75 y.o.   MRN: [id number]  HPI   75 yo never smoker with asthma for FU of dyspnea. She was evaluated in 2015 & last seen 11/2017  for similar problem and no clear cause was identified  She was diagnosed with OSA in 2015 but did not want to get on CPAP therapy.She was placed on oxygen at night by her PCP.  She reports onset of asthma since her 89s .  Triggers include weather changes and allergies.  She has not needed prednisone in a long time.  She is maintained on Pulmicort nebs and has albuterol for her rescue inhaler.  She denies hospitalizations or ER visits.  Asthma presents with wheezing  She presents for reevaluation after 3 years, accompanied by her daughter today who provides much of the history. Ambulates with a cane.  She developed COVID infection 01/2019 and was hospitalized for 11 days, discharged on oxygen but has weaned herself off during the daytime and continues to use this during the night. Reviewed cardiology consultation and evaluation for dyspnea including heart cath. Chest x-ray 02/2019 independently reviewed which shows bilateral pneumonia in the setting of COVID infection. CT abdomen 07/2018 showed clear bases  Her rheumatoid symptoms are not well controlled, she had a nonhealing ulcer in her leg and was taken off methotrexate, rheumatoid nodules are coming back, she is on Plaquenil now but her arthritis symptoms are present and are hands.  She sees Dr. Estanislado Pandy. She sees Dr. Louanne Skye from orthopedics in [place] for chronic pain    PMH - rheumatoid arthritis , on weekly methotrexate injections -20 mg - rheumatoid nodules - bilateral knee replacements in 2014.  - CABG in 2005. Nuclear stress test in December of 2011 was normal. Echo -suggested diastolic dysfunction with elevated EDP. Pulmonary artery pressure was normal. - back surgery that was complicated with a dural tear and ambulates with  a cane    On ambulation, heart rate increased from 55-76 but oxygen level dropped very mildly from 98 to 94%  Significant tests/ events reviewed  09/2020 RHC/LHC >> moderate pulmonary pretension, PA mean 25, occluded SVG to diagonal Spirometry 11/2017 ratio 82, FEV1 67%, FVC 62%  09/2013 Spirometry showed moderate restriction with a ratio 77, FEV 1.45-62% and FVC 1.87-62%. There is no evidence of airway obstruction  11/2013 HST - AHI 25/h  Review of Systems neg for any significant sore throat, dysphagia, itching, sneezing, nasal congestion or excess/ purulent secretions, fever, chills, sweats, unintended wt loss, pleuritic or exertional cp, hempoptysis, orthopnea pnd or change in chronic leg swelling. Also denies presyncope, palpitations, heartburn, abdominal pain, nausea, vomiting, diarrhea or change in bowel or urinary habits, dysuria,hematuria, rash, arthralgias, visual complaints, headache, numbness weakness or ataxia.     Objective:   Physical Exam  Gen. Pleasant, well-nourished, in no distress, normal affect ENT - no pallor,icterus, no post nasal drip Neck: No JVD, no thyromegaly, no carotid bruits Lungs: no use of accessory muscles, no dullness to percussion, clear without rales or rhonchi  Cardiovascular: Rhythm regular, heart sounds  normal, no murmurs or gallops, no peripheral edema Abdomen: soft and non-tender, no hepatosplenomegaly, BS normal. Musculoskeletal: No deformities, no cyanosis or clubbing, arthritic changes in her hands, ambulates with a cane Neuro:  alert, non focal        Assessment & Plan:

## 2020-11-10 NOTE — Assessment & Plan Note (Signed)
Prior evaluation showed moderate OSA but she does not want to go through CPAP therapy.  She remains on nocturnal oxygen

## 2020-11-10 NOTE — Assessment & Plan Note (Addendum)
Her dyspnea predates COVID infection but certainly appears to be worse now.  She appears to be severely deconditioned and this is objectively confirmed by increase in heart rate on ambulation.  She does not desaturate on exertion. I think we should evaluate for post COVID-related fibrosis with a high-resolution CT chest and PFTs. Previa spirometry have shown restriction but no evidence of airway obstruction.  The other differential here would be rheumatoid induced ILD, I do not hear any crackles on exam today which is reassuring I will proceed with a referral to pulmonary rehab for restrictive lung disease and deconditioning

## 2020-11-11 ENCOUNTER — Encounter (HOSPITAL_COMMUNITY): Payer: Self-pay | Admitting: *Deleted

## 2020-11-11 DIAGNOSIS — N812 Incomplete uterovaginal prolapse: Secondary | ICD-10-CM | POA: Diagnosis not present

## 2020-11-11 DIAGNOSIS — R152 Fecal urgency: Secondary | ICD-10-CM | POA: Diagnosis not present

## 2020-11-11 DIAGNOSIS — K5289 Other specified noninfective gastroenteritis and colitis: Secondary | ICD-10-CM | POA: Diagnosis not present

## 2020-11-11 DIAGNOSIS — I2510 Atherosclerotic heart disease of native coronary artery without angina pectoris: Secondary | ICD-10-CM | POA: Diagnosis not present

## 2020-11-11 DIAGNOSIS — R600 Localized edema: Secondary | ICD-10-CM | POA: Diagnosis not present

## 2020-11-11 DIAGNOSIS — R159 Full incontinence of feces: Secondary | ICD-10-CM | POA: Diagnosis not present

## 2020-11-11 DIAGNOSIS — Z6841 Body Mass Index (BMI) 40.0 and over, adult: Secondary | ICD-10-CM | POA: Diagnosis not present

## 2020-11-11 DIAGNOSIS — N3281 Overactive bladder: Secondary | ICD-10-CM | POA: Diagnosis not present

## 2020-11-11 NOTE — Progress Notes (Signed)
Received referral from Dr. Elsworth Soho for this pt to participate in pulmonary rehab with the the diagnosis of Restrictive Lung disease. Clinical review of pt follow up appt on 11/10/20.  Pulmonary office note.  Also reviewed office notes in [facility]. Pt with Covid Risk Score - 6. Pt appropriate for scheduling for Pulmonary rehab.  Will forward to support staff for scheduling and verification of insurance eligibility/benefits. Cherre Huger, BSN Cardiac and Training and development officer

## 2020-11-12 ENCOUNTER — Other Ambulatory Visit: Payer: Self-pay | Admitting: Internal Medicine

## 2020-11-12 DIAGNOSIS — Z1231 Encounter for screening mammogram for malignant neoplasm of breast: Secondary | ICD-10-CM

## 2020-11-13 DIAGNOSIS — E7800 Pure hypercholesterolemia, unspecified: Secondary | ICD-10-CM | POA: Diagnosis not present

## 2020-11-14 LAB — BASIC METABOLIC PANEL
BUN/Creatinine Ratio: 32 — ABNORMAL HIGH (ref 12–28)
BUN: 18 mg/dL (ref 8–27)
CO2: 22 mmol/L (ref 20–29)
Calcium: 9.1 mg/dL (ref 8.7–10.3)
Chloride: 101 mmol/L (ref 96–106)
Creatinine, Ser: 0.56 mg/dL — ABNORMAL LOW (ref 0.57–1.00)
Glucose: 214 mg/dL — ABNORMAL HIGH (ref 65–99)
Potassium: 4.3 mmol/L (ref 3.5–5.2)
Sodium: 142 mmol/L (ref 134–144)
eGFR: 95 mL/min/{1.73_m2} (ref >59)

## 2020-11-14 LAB — URIC ACID: Uric Acid: 7.3 mg/dL (ref 3.1–7.9)

## 2020-11-15 ENCOUNTER — Other Ambulatory Visit: Payer: Self-pay | Admitting: *Deleted

## 2020-11-15 DIAGNOSIS — M0609 Rheumatoid arthritis without rheumatoid factor, multiple sites: Secondary | ICD-10-CM

## 2020-11-15 NOTE — Telephone Encounter (Signed)
RF faxed from [place] Visit: 09/09/2020 Next Visit: 02/04/2021 Labs: 11/13/2020, Glucose 214, Creatinine 0.56, BUN/Creatinine Ratio 32 10/02/2020 CBC normal Eye exam: 10/26/4020  Current Dose per office note 09/09/2020,  Plaquenil 200 mg 1 tablet by mouth twice daily  DX: Rheumatoid arthritis of multiple sites with negative rheumatoid factor   Last Fill: 05/29/2020  Okay to refill Plaquenil?

## 2020-11-16 MED ORDER — HYDROXYCHLOROQUINE SULFATE 200 MG PO TABS: ORAL_TABLET | ORAL | 0 refills | Status: DC

## 2020-11-17 ENCOUNTER — Other Ambulatory Visit: Payer: Self-pay | Admitting: *Deleted

## 2020-11-17 DIAGNOSIS — M0609 Rheumatoid arthritis without rheumatoid factor, multiple sites: Secondary | ICD-10-CM

## 2020-11-17 MED ORDER — HYDROXYCHLOROQUINE SULFATE 200 MG PO TABS: ORAL_TABLET | ORAL | 0 refills | Status: DC

## 2020-11-18 ENCOUNTER — Other Ambulatory Visit: Payer: Self-pay

## 2020-11-18 ENCOUNTER — Ambulatory Visit (INDEPENDENT_AMBULATORY_CARE_PROVIDER_SITE_OTHER)
Admission: RE | Admit: 2020-11-18 | Discharge: 2020-11-18 | Disposition: A | Discharge status: Home / Self Care | Payer: Medicare HMO | Source: Ambulatory Visit | Attending: Pulmonary Disease | Admitting: Pulmonary Disease

## 2020-11-18 DIAGNOSIS — R0609 Other forms of dyspnea: Secondary | ICD-10-CM | POA: Diagnosis not present

## 2020-11-18 DIAGNOSIS — J84112 Idiopathic pulmonary fibrosis: Secondary | ICD-10-CM | POA: Diagnosis not present

## 2020-11-18 DIAGNOSIS — I2510 Atherosclerotic heart disease of native coronary artery without angina pectoris: Secondary | ICD-10-CM | POA: Diagnosis not present

## 2020-11-18 DIAGNOSIS — J479 Bronchiectasis, uncomplicated: Secondary | ICD-10-CM | POA: Diagnosis not present

## 2020-11-18 DIAGNOSIS — J849 Interstitial pulmonary disease, unspecified: Secondary | ICD-10-CM | POA: Diagnosis not present

## 2020-11-18 MED ORDER — NEXLETOL 180 MG PO TABS: 180.0000 mg | ORAL_TABLET | Freq: Every day | ORAL | 11 refills | Status: DC

## 2020-11-19 ENCOUNTER — Other Ambulatory Visit: Payer: Self-pay

## 2020-11-19 MED ORDER — NEXLETOL 180 MG PO TABS: 180.0000 mg | ORAL_TABLET | Freq: Every day | ORAL | 11 refills | Status: DC

## 2020-11-19 NOTE — Progress Notes (Signed)
Her rheumatoid arthritis is well controlled.  She had no synovitis on the examination at the last visit.  In your opinion if the CT scan findings are related to COVID-19 I would prefer not to suppress her immune system any further.  Maybe she will benefit from antifibrotic agents if needed.

## 2020-11-20 ENCOUNTER — Other Ambulatory Visit (HOSPITAL_COMMUNITY)
Admission: RE | Admit: 2020-11-20 | Discharge: 2020-11-20 | Disposition: A | Discharge status: Home / Self Care | Payer: Medicare HMO | Source: Ambulatory Visit | Attending: Pulmonary Disease | Admitting: Pulmonary Disease

## 2020-11-20 DIAGNOSIS — Z01812 Encounter for preprocedural laboratory examination: Secondary | ICD-10-CM | POA: Insufficient documentation

## 2020-11-20 DIAGNOSIS — Z20822 Contact with and (suspected) exposure to covid-19: Secondary | ICD-10-CM | POA: Diagnosis not present

## 2020-11-20 LAB — SARS CORONAVIRUS 2 (TAT 6-24 HRS): SARS Coronavirus 2: NEGATIVE

## 2020-11-20 NOTE — Telephone Encounter (Signed)
Good to know.  Certainly could be COVID-related No new recommendations at this time

## 2020-11-20 NOTE — Telephone Encounter (Signed)
RA please advise on pt email sent in by pt's daughter Lynelle Smoke (dpr on file):  Dr. Elsworth Soho,  When meeting with you the other week, I forgot to mention that mom had pneumonia at the end of December 2021.  Her 2 grand-daughters, 1 grandson and 1 great grand-daughter had covid at the same time.  Mom and my sister both tested negative for covid but got sick at the same time and both mom and my sister got pneumonia. My sister's doctor wonders if she tested negative for covid but really had it.  Mom did at home test and rapid test in doctor's office, both negative.  Did not know if this information would be important when evaluating the cause of lung MRI results.  Thinking back, mom thinks this may be when shortness of breath started and continued and pain increase due to RA starting increasing around October limiting her mobility and shortness of breath.  Thanks, Tammy Chapmon

## 2020-11-20 NOTE — Progress Notes (Signed)
Based on the arthritis she does not need any aggressive therapy.  Do recommend starting her on Imuran for the underlying ILD?  Please advise.

## 2020-11-21 DIAGNOSIS — E1151 Type 2 diabetes mellitus with diabetic peripheral angiopathy without gangrene: Secondary | ICD-10-CM | POA: Diagnosis not present

## 2020-11-21 DIAGNOSIS — E785 Hyperlipidemia, unspecified: Secondary | ICD-10-CM | POA: Diagnosis not present

## 2020-11-21 DIAGNOSIS — I10 Essential (primary) hypertension: Secondary | ICD-10-CM | POA: Diagnosis not present

## 2020-11-21 DIAGNOSIS — K219 Gastro-esophageal reflux disease without esophagitis: Secondary | ICD-10-CM | POA: Diagnosis not present

## 2020-11-24 ENCOUNTER — Other Ambulatory Visit: Payer: Self-pay

## 2020-11-24 ENCOUNTER — Ambulatory Visit (INDEPENDENT_AMBULATORY_CARE_PROVIDER_SITE_OTHER): Payer: Medicare HMO | Admitting: Pulmonary Disease

## 2020-11-24 DIAGNOSIS — J849 Interstitial pulmonary disease, unspecified: Secondary | ICD-10-CM

## 2020-11-24 DIAGNOSIS — R0609 Other forms of dyspnea: Secondary | ICD-10-CM

## 2020-11-24 DIAGNOSIS — R0600 Dyspnea, unspecified: Secondary | ICD-10-CM | POA: Diagnosis not present

## 2020-11-24 LAB — PULMONARY FUNCTION TEST
DL/VA % pred: 99 %
DL/VA: 4.07 ml/min/mmHg/L
DLCO cor % pred: 84 %
DLCO cor: 16.19 ml/min/mmHg
DLCO unc % pred: 84 %
DLCO unc: 16.19 ml/min/mmHg
FEF 25-75 Post: 2.24 L/sec
FEF 25-75 Pre: 2.06 L/sec
FEF2575-%Change-Post: 9 %
FEF2575-%Pred-Post: 135 %
FEF2575-%Pred-Pre: 123 %
FEV1-%Change-Post: 0 %
FEV1-%Pred-Post: 91 %
FEV1-%Pred-Pre: 91 %
FEV1-Post: 1.92 L
FEV1-Pre: 1.92 L
FEV1FVC-%Change-Post: 1 %
FEV1FVC-%Pred-Pre: 110 %
FEV6-%Change-Post: -1 %
FEV6-%Pred-Post: 85 %
FEV6-%Pred-Pre: 87 %
FEV6-Post: 2.29 L
FEV6-Pre: 2.33 L
FEV6FVC-%Pred-Post: 105 %
FEV6FVC-%Pred-Pre: 105 %
FVC-%Change-Post: -1 %
FVC-%Pred-Post: 81 %
FVC-%Pred-Pre: 82 %
FVC-Post: 2.29 L
FVC-Pre: 2.33 L
Post FEV1/FVC ratio: 84 %
Post FEV6/FVC ratio: 100 %
Pre FEV1/FVC ratio: 82 %
Pre FEV6/FVC Ratio: 100 %
RV % pred: 85 %
RV: 1.95 L
TLC % pred: 84 %
TLC: 4.29 L

## 2020-11-24 NOTE — Progress Notes (Signed)
PFT done today. 

## 2020-11-27 DIAGNOSIS — M069 Rheumatoid arthritis, unspecified: Secondary | ICD-10-CM | POA: Diagnosis not present

## 2020-11-27 DIAGNOSIS — M5030 Other cervical disc degeneration, unspecified cervical region: Secondary | ICD-10-CM | POA: Diagnosis not present

## 2020-11-27 DIAGNOSIS — M47816 Spondylosis without myelopathy or radiculopathy, lumbar region: Secondary | ICD-10-CM | POA: Diagnosis not present

## 2020-11-27 DIAGNOSIS — M47812 Spondylosis without myelopathy or radiculopathy, cervical region: Secondary | ICD-10-CM | POA: Diagnosis not present

## 2020-11-27 DIAGNOSIS — Z79899 Other long term (current) drug therapy: Secondary | ICD-10-CM | POA: Diagnosis not present

## 2020-12-03 DIAGNOSIS — J1289 Other viral pneumonia: Secondary | ICD-10-CM | POA: Diagnosis not present

## 2020-12-03 DIAGNOSIS — R0602 Shortness of breath: Secondary | ICD-10-CM | POA: Diagnosis not present

## 2020-12-03 DIAGNOSIS — U071 COVID-19: Secondary | ICD-10-CM | POA: Diagnosis not present

## 2020-12-03 DIAGNOSIS — J9621 Acute and chronic respiratory failure with hypoxia: Secondary | ICD-10-CM | POA: Diagnosis not present

## 2020-12-07 ENCOUNTER — Telehealth (HOSPITAL_COMMUNITY): Payer: Self-pay

## 2020-12-07 ENCOUNTER — Encounter (HOSPITAL_COMMUNITY): Payer: Self-pay

## 2020-12-07 NOTE — Telephone Encounter (Signed)
Pt insurance is active and benefits verified through [facility]. Co-pay $10.00, DED $0.00/$0.00 met, out of pocket $3,900.00/$535.00 met, co-insurance 0%. No pre-authorization required. Vern L./[employer], 12/02/20 @ 354PM, QPR#[id number]  Will contact patient to see if she is interested in the Pulmonary Rehab Program.

## 2020-12-07 NOTE — Telephone Encounter (Signed)
Attempted to call patient in regards to Pulmonary Rehab - LM on VM Mailed letter 

## 2020-12-08 DIAGNOSIS — E785 Hyperlipidemia, unspecified: Secondary | ICD-10-CM | POA: Diagnosis not present

## 2020-12-08 DIAGNOSIS — E041 Nontoxic single thyroid nodule: Secondary | ICD-10-CM | POA: Diagnosis not present

## 2020-12-08 DIAGNOSIS — E1151 Type 2 diabetes mellitus with diabetic peripheral angiopathy without gangrene: Secondary | ICD-10-CM | POA: Diagnosis not present

## 2020-12-15 DIAGNOSIS — I2510 Atherosclerotic heart disease of native coronary artery without angina pectoris: Secondary | ICD-10-CM | POA: Diagnosis not present

## 2020-12-15 DIAGNOSIS — F329 Major depressive disorder, single episode, unspecified: Secondary | ICD-10-CM | POA: Diagnosis not present

## 2020-12-15 DIAGNOSIS — I10 Essential (primary) hypertension: Secondary | ICD-10-CM | POA: Diagnosis not present

## 2020-12-15 DIAGNOSIS — R82998 Other abnormal findings in urine: Secondary | ICD-10-CM | POA: Diagnosis not present

## 2020-12-15 DIAGNOSIS — E1151 Type 2 diabetes mellitus with diabetic peripheral angiopathy without gangrene: Secondary | ICD-10-CM | POA: Diagnosis not present

## 2020-12-15 DIAGNOSIS — E785 Hyperlipidemia, unspecified: Secondary | ICD-10-CM | POA: Diagnosis not present

## 2020-12-15 DIAGNOSIS — Z0000 Encounter for general adult medical examination without abnormal findings: Secondary | ICD-10-CM | POA: Diagnosis not present

## 2020-12-15 DIAGNOSIS — Z23 Encounter for immunization: Secondary | ICD-10-CM | POA: Diagnosis not present

## 2020-12-15 DIAGNOSIS — D8989 Other specified disorders involving the immune mechanism, not elsewhere classified: Secondary | ICD-10-CM | POA: Diagnosis not present

## 2020-12-15 DIAGNOSIS — J45909 Unspecified asthma, uncomplicated: Secondary | ICD-10-CM | POA: Diagnosis not present

## 2020-12-15 DIAGNOSIS — K921 Melena: Secondary | ICD-10-CM | POA: Diagnosis not present

## 2020-12-15 DIAGNOSIS — G629 Polyneuropathy, unspecified: Secondary | ICD-10-CM | POA: Diagnosis not present

## 2020-12-15 LAB — IFOBT (OCCULT BLOOD): IFOBT: POSITIVE

## 2020-12-22 ENCOUNTER — Other Ambulatory Visit: Payer: Self-pay | Admitting: Podiatry

## 2020-12-22 ENCOUNTER — Ambulatory Visit (INDEPENDENT_AMBULATORY_CARE_PROVIDER_SITE_OTHER): Payer: Medicare HMO

## 2020-12-22 ENCOUNTER — Ambulatory Visit: Payer: Medicare HMO | Admitting: Podiatry

## 2020-12-22 ENCOUNTER — Other Ambulatory Visit: Payer: Self-pay

## 2020-12-22 DIAGNOSIS — L84 Corns and callosities: Secondary | ICD-10-CM

## 2020-12-22 DIAGNOSIS — M722 Plantar fascial fibromatosis: Secondary | ICD-10-CM

## 2020-12-22 DIAGNOSIS — L97329 Non-pressure chronic ulcer of left ankle with unspecified severity: Secondary | ICD-10-CM

## 2020-12-22 DIAGNOSIS — M216X2 Other acquired deformities of left foot: Secondary | ICD-10-CM | POA: Diagnosis not present

## 2020-12-22 DIAGNOSIS — I83023 Varicose veins of left lower extremity with ulcer of ankle: Secondary | ICD-10-CM | POA: Diagnosis not present

## 2020-12-22 DIAGNOSIS — I739 Peripheral vascular disease, unspecified: Secondary | ICD-10-CM | POA: Diagnosis not present

## 2020-12-23 NOTE — Progress Notes (Signed)
Subjective: 75 year old female presents the office for concerns of a callus causing discomfort about her left foot.  Is been ongoing for about a month or more.  No recent injury or trauma denies of any foreign objects.  Points to submetatarsal 5 on the left foot where she gets majority discomfort.  Denies any opening or swelling or redness to this area.  As an aside she had a bandage on her left leg that I was discussing with her.  She has had a wound apparently for a long time this.  She states that she has seen numerous specialties for this, except the wound care center which she does not want to go.  Currently being treated by her primary care physician.  Denies any systemic complaints such as fevers, chills, nausea, vomiting. No acute changes since last appointment, and no other complaints at this time.   Objective: AAO x3, NAD DP/PT pulses palpable bilaterally Protective sensation intact with Simms Weinstein monofilament, vibratory sensation Thick hyperkeratotic lesion left foot submetatarsal 5.  Upon debridement there is no underlying ulceration drainage or any signs of infection.  There is prominence of the metatarsal heads plantarly with atrophy of the fat pad. Chronic venous insufficiency noted.  Superficial wounds present on the medial ankle just proximal to the medial malleolus.  No drainage or possibly obvious signs of infection. No pain with calf compression, swelling, warmth, erythema  Assessment: Hyperkeratotic lesion to the prominent metatarsal head; chronic venous stasis wound  Plan: -All treatment options discussed with the patient including all alternatives, risks, complications.  -X-rays obtained reviewed.  No subacute fracture.  Venous stasis changes noted on of the leg. -Sharp debrided the hyperkeratotic lesion left foot without any complications or bleeding.  Recommend moisturizer.  Offloading pads were dispensed. -For the wound she is currently being treated by her  primary care physician's office, Dr. Joylene Draft.  I discussed with the patient that she may benefit from [employer] therapy weekly. I will forward this note to his office as the patient is asking if they can do this.  If not I am happy to see her back for this wound if desired. -Patient encouraged to call the office with any questions, concerns, change in symptoms.   Trula Slade DPM

## 2020-12-24 ENCOUNTER — Telehealth (HOSPITAL_COMMUNITY): Payer: Self-pay

## 2020-12-24 NOTE — Telephone Encounter (Signed)
No response from pt.  Closed referral  

## 2020-12-28 DIAGNOSIS — M47812 Spondylosis without myelopathy or radiculopathy, cervical region: Secondary | ICD-10-CM | POA: Diagnosis not present

## 2020-12-28 DIAGNOSIS — M069 Rheumatoid arthritis, unspecified: Secondary | ICD-10-CM | POA: Diagnosis not present

## 2020-12-28 DIAGNOSIS — M5030 Other cervical disc degeneration, unspecified cervical region: Secondary | ICD-10-CM | POA: Diagnosis not present

## 2020-12-28 DIAGNOSIS — M47816 Spondylosis without myelopathy or radiculopathy, lumbar region: Secondary | ICD-10-CM | POA: Diagnosis not present

## 2020-12-28 DIAGNOSIS — Z79899 Other long term (current) drug therapy: Secondary | ICD-10-CM | POA: Diagnosis not present

## 2021-01-01 ENCOUNTER — Ambulatory Visit
Admission: RE | Admit: 2021-01-01 | Discharge: 2021-01-01 | Disposition: A | Discharge status: Home / Self Care | Payer: Medicare HMO | Source: Ambulatory Visit | Attending: Internal Medicine | Admitting: Internal Medicine

## 2021-01-01 ENCOUNTER — Other Ambulatory Visit: Payer: Self-pay

## 2021-01-01 DIAGNOSIS — Z1231 Encounter for screening mammogram for malignant neoplasm of breast: Secondary | ICD-10-CM

## 2021-01-03 DIAGNOSIS — R0602 Shortness of breath: Secondary | ICD-10-CM | POA: Diagnosis not present

## 2021-01-03 DIAGNOSIS — U071 COVID-19: Secondary | ICD-10-CM | POA: Diagnosis not present

## 2021-01-03 DIAGNOSIS — J9621 Acute and chronic respiratory failure with hypoxia: Secondary | ICD-10-CM | POA: Diagnosis not present

## 2021-01-03 DIAGNOSIS — J1289 Other viral pneumonia: Secondary | ICD-10-CM | POA: Diagnosis not present

## 2021-01-04 ENCOUNTER — Ambulatory Visit: Payer: Medicare HMO

## 2021-01-04 DIAGNOSIS — L820 Inflamed seborrheic keratosis: Secondary | ICD-10-CM | POA: Diagnosis not present

## 2021-01-04 DIAGNOSIS — L821 Other seborrheic keratosis: Secondary | ICD-10-CM | POA: Diagnosis not present

## 2021-01-04 DIAGNOSIS — L738 Other specified follicular disorders: Secondary | ICD-10-CM | POA: Diagnosis not present

## 2021-01-04 DIAGNOSIS — L718 Other rosacea: Secondary | ICD-10-CM | POA: Diagnosis not present

## 2021-01-04 DIAGNOSIS — D485 Neoplasm of uncertain behavior of skin: Secondary | ICD-10-CM | POA: Diagnosis not present

## 2021-01-04 DIAGNOSIS — M793 Panniculitis, unspecified: Secondary | ICD-10-CM | POA: Diagnosis not present

## 2021-01-06 DIAGNOSIS — E119 Type 2 diabetes mellitus without complications: Secondary | ICD-10-CM | POA: Diagnosis not present

## 2021-01-06 DIAGNOSIS — H35372 Puckering of macula, left eye: Secondary | ICD-10-CM | POA: Diagnosis not present

## 2021-01-06 DIAGNOSIS — H04123 Dry eye syndrome of bilateral lacrimal glands: Secondary | ICD-10-CM | POA: Diagnosis not present

## 2021-01-06 DIAGNOSIS — M0570 Rheumatoid arthritis with rheumatoid factor of unspecified site without organ or systems involvement: Secondary | ICD-10-CM | POA: Diagnosis not present

## 2021-01-06 DIAGNOSIS — Z79899 Other long term (current) drug therapy: Secondary | ICD-10-CM | POA: Diagnosis not present

## 2021-01-20 DIAGNOSIS — N812 Incomplete uterovaginal prolapse: Secondary | ICD-10-CM | POA: Insufficient documentation

## 2021-01-20 DIAGNOSIS — K580 Irritable bowel syndrome with diarrhea: Secondary | ICD-10-CM | POA: Diagnosis not present

## 2021-01-20 DIAGNOSIS — Z6841 Body Mass Index (BMI) 40.0 and over, adult: Secondary | ICD-10-CM | POA: Insufficient documentation

## 2021-01-20 DIAGNOSIS — N3946 Mixed incontinence: Secondary | ICD-10-CM | POA: Diagnosis not present

## 2021-01-20 DIAGNOSIS — R159 Full incontinence of feces: Secondary | ICD-10-CM | POA: Insufficient documentation

## 2021-01-20 DIAGNOSIS — R152 Fecal urgency: Secondary | ICD-10-CM | POA: Diagnosis not present

## 2021-01-20 DIAGNOSIS — N813 Complete uterovaginal prolapse: Secondary | ICD-10-CM | POA: Diagnosis not present

## 2021-01-21 NOTE — Progress Notes (Signed)
Office Visit Note  Patient: [name]             Date of Birth: 01/21/1946           MRN: [id number]             PCP: Crist Infante, MD Referring: Crist Infante, MD Visit Date: 02/04/2021 Occupation: @GUAROCC @  Subjective:  Pain in multiple joints.   History of Present Illness: [name] is a 75 y.o. female with the history of rheumatoid arthritis and osteoarthritis.  She states she continues to have pain and discomfort in her bilateral hands and bilateral feet.  She has difficulty walking due to tingling and numbness in her feet.  She also has a lot of fluid retention.  The lesion on her left lower extremity is having difficulty healing.  She states she spends 2 hours taking care of her lower extremities every morning.  Activities of Daily Living:  Patient reports morning stiffness for 2 hours.   Patient Reports nocturnal pain.  Difficulty dressing/grooming: Reports Difficulty climbing stairs: Reports Difficulty getting out of chair: Reports Difficulty using hands for taps, buttons, cutlery, and/or writing: Reports  Review of Systems  Constitutional:  Positive for fatigue.  HENT:  Positive for mouth dryness. Negative for mouth sores and nose dryness.   Eyes:  Negative for pain, itching and dryness.  Respiratory:  Positive for shortness of breath and difficulty breathing.   Cardiovascular:  Negative for chest pain and palpitations.  Gastrointestinal:  Positive for blood in stool and diarrhea. Negative for constipation.  Endocrine: Negative for increased urination.  Genitourinary:  Negative for difficulty urinating.  Musculoskeletal:  Positive for joint pain, joint pain, joint swelling, myalgias, morning stiffness, muscle tenderness and myalgias.  Skin:  Positive for color change and redness. Negative for rash.  Allergic/Immunologic: Positive for susceptible to infections.  Neurological:  Positive for headaches, memory loss and weakness. Negative for dizziness and  numbness.  Hematological:  Negative for bruising/bleeding tendency.  Psychiatric/Behavioral:  Positive for confusion.    PMFS History:  Patient Active Problem List   Diagnosis Date Noted   Unstable angina ([place]) 10/06/2020   Acute on chronic respiratory failure with hypoxia ([place]) 02/28/2019   OSA on CPAP 02/28/2019   Diabetes mellitus type 2, uncontrolled, with complications ([place]) 09/47/0962   Breast cancer ([place]) 02/27/2019   History of cancer chemotherapy 02/27/2019   History of radiation therapy 02/27/2019   Diabetic neuropathy ([place]) 02/27/2019   HLD (hyperlipidemia) 02/27/2019   Essential hypertension 02/27/2019   QT prolongation 02/27/2019   Pneumonia due to COVID-19 virus 02/24/2019   Diarrhea    Benign neoplasm of cecum    Abdominal pain, epigastric    Gastroesophageal reflux disease    Dependence on nocturnal oxygen therapy 07/10/2017   Open wound of left lower leg 12/26/2016   Vitamin D deficiency 09/14/2016   History of total knee replacement, bilateral 09/14/2016   DJD (degenerative joint disease), cervical 09/14/2016   Spondylosis of lumbar region without myelopathy or radiculopathy 09/14/2016   High risk medication use 05/20/2016   Spinal stenosis, lumbar region, with neurogenic claudication 09/08/2015    Class: Chronic   OSA (obstructive sleep apnea) 10/17/2013   Dyspnea 09/04/2013   Atypical chest pain 09/04/2013   Morbid obesity ([place]) 03/21/2012   Cancer of lower-outer quadrant of female breast ([place]) 03/15/2012   Diabetes mellitus, type II ([place]) 02/22/2011   Coronary artery disease    Hypertension    Hyperlipidemia    OA (  osteoarthritis) of knee     Past Medical History:  Diagnosis Date   Actinic keratosis    Asthma    Albuterol inhaler as needed.Pulmicort neb as needed   Asthma    Breast cancer ([place])    right - lumpectomy    Chronic back pain    spinal stenosis   Coronary artery disease    Dependence on nocturnal oxygen therapy 07/10/2017   Pt  uses 2 liters 02 at night    Depression    takes CYmbalta and Wellbutrin daily   Diabetes mellitus    not on any meds/controlled by diet   Dyspnea    with exertion occasionally when lies down   GERD (gastroesophageal reflux disease)    takes Omeprazole daily   Headache    oocasionally   History of kidney stones    Hyperlipidemia    not on any meds   Hypertension    takes Benazepril,Bystolic,and Amlodipine daily   IBS (irritable bowel syndrome)    Joint pain    Joint swelling    Livedoid vasculitis    Nocturia    OA (osteoarthritis) of knee    OSA (obstructive sleep apnea)    Other seborrheic keratosis    Oxygen deficiency    2 liters at night per pt for OSA- no cpap    Peripheral edema    takes daily as needed   Peripheral neuropathy    Personal history of chemotherapy    Personal history of radiation therapy    Pneumonia    hx of-several yrs ago   RA (rheumatoid arthritis) ([place])    Sleep apnea    pt states she uses oxygen at night - no cpap- uses 02 2 liters at night    Urinary frequency    Urinary urgency    Weakness    numbness and tingling mainly in left leg occasionally in right.Tingling/numbness in hands    Family History  Problem Relation Age of Onset   Heart disease Mother    Cancer Father    Cancer Brother    Heart disease Brother    Past Surgical History:  Procedure Laterality Date   BREAST LUMPECTOMY  1997   RIGHT BREAST   CARDIAC CATHETERIZATION  04/23/2004   EF 60%   cataract surgery Bilateral    COLONOSCOPY     COLONOSCOPY WITH PROPOFOL N/A 08/07/2017   Procedure: COLONOSCOPY WITH PROPOFOL;  Surgeon: Ladene Artist, MD;  Location: [facility];  Service: Endoscopy;  Laterality: N/A;   CORONARY ARTERY BYPASS GRAFT  2005   LIMA GRAFT TO LAD, SAPHENOUS VEIN GRAFT TO THE FIRST DIAGONAL, AND LEFT RADIAL ARTERY GRAFT TO THE OM   ESOPHAGOGASTRODUODENOSCOPY (EGD) WITH PROPOFOL N/A 08/07/2017   Procedure: ESOPHAGOGASTRODUODENOSCOPY (EGD) WITH PROPOFOL;   Surgeon: Ladene Artist, MD;  Location: [facility];  Service: Endoscopy;  Laterality: N/A;   FOOT SURGERY Right    JOINT REPLACEMENT     LITHOTRIPSY     LUMBAR LAMINECTOMY/DECOMPRESSION MICRODISCECTOMY N/A 09/08/2015   Procedure: CENTRAL DECOMPRESSIVE LUMBAR LAMINECTOMIES L3-4, L4-5 AND BILATERAL HEMILAMINECTOMY L5-S1;  Surgeon: Jessy Oto, MD;  Location: [facility];  Service: Orthopedics;  Laterality: N/A;   nodule removed from left elbow     RIGHT/LEFT HEART CATH AND CORONARY/GRAFT ANGIOGRAPHY N/A 10/06/2020   Procedure: RIGHT/LEFT HEART CATH AND CORONARY/GRAFT ANGIOGRAPHY;  Surgeon: Martinique, Peter M, MD;  Location: [facility];  Service: Cardiovascular;  Laterality: N/A;   SHOULDER ARTHROSCOPY     TOTAL KNEE  ARTHROPLASTY Bilateral    TOTAL SHOULDER ARTHROPLASTY     TRIPLE BYPASS  04/27/05   US ECHOCARDIOGRAPHY  01/06/2009   EF 55-60%   WRIST SURGERY Left    Social History   Social History Narrative   Not on file   Immunization History  Administered Date(s) Administered   Influenza Split 04/24/2013   PFIZER(Purple Top)SARS-COV-2 Vaccination 09/01/2019, 09/25/2019, 04/04/2020   Pneumococcal Conjugate-13 08/26/2016   Zoster Recombinat (Shingrix) 10/11/2017, 12/22/2017     Objective: Vital Signs: BP (!) 150/83 (BP Location: Left Arm, Patient Position: Sitting, Cuff Size: Normal)   Pulse (!) 57   Ht 5\' 4"  (1.626 m)   Wt 241 lb 12.8 oz (109.7 kg)   BMI 41.50 kg/m    Physical Exam Vitals and nursing note reviewed.  Constitutional:      Appearance: She is well-developed.  HENT:     Head: Normocephalic and atraumatic.  Eyes:     Conjunctiva/sclera: Conjunctivae normal.  Cardiovascular:     Rate and Rhythm: Normal rate and regular rhythm.     Heart sounds: Normal heart sounds.  Pulmonary:     Effort: Pulmonary effort is normal.     Breath sounds: Normal breath sounds.  Abdominal:     General: Bowel sounds are normal.     Palpations: Abdomen is soft.   Musculoskeletal:     Cervical back: Normal range of motion.     Right lower leg: Edema present.     Left lower leg: Edema present.  Lymphadenopathy:     Cervical: No cervical adenopathy.  Skin:    General: Skin is warm and dry.     Capillary Refill: Capillary refill takes less than 2 seconds.  Neurological:     Mental Status: She is alert and oriented to person, place, and time.  Psychiatric:        Behavior: Behavior normal.     Musculoskeletal Exam: C-spine was in limited range of motion.  Shoulder joint abduction was limited to about 30 degrees.  Left shoulder joint was in good range of motion.  There was no synovitis of her wrist joints or MCP joints.  Synovial thickening was noted over bilateral second and third MCPs.  PIP and DIP prominence was noted.  No synovitis was noted.  Hip joints and knee joints in good range of motion.  She has significant bilateral pedal edema.  No tenderness was noted over MTPs or ankle joints.  CDAI Exam: CDAI Score: 0.9  Patient Global: 5 mm; Provider Global: 4 mm Swollen: 0 ; Tender: 0  Joint Exam 02/04/2021   No joint exam has been documented for this visit   There is currently no information documented on the homunculus. Go to the Rheumatology activity and complete the homunculus joint exam.  Investigation: No additional findings.  Imaging: No results found.  Recent Labs: Lab Results  Component Value Date   WBC 7.7 10/02/2020   HGB 12.6 10/06/2020   PLT 185 10/02/2020   NA 142 11/13/2020   K 4.3 11/13/2020   CL 101 11/13/2020   CO2 22 11/13/2020   GLUCOSE 214 (H) 11/13/2020   BUN 18 11/13/2020   CREATININE 0.56 (L) 11/13/2020   BILITOT 0.4 10/02/2020   ALKPHOS 54 10/02/2020   AST 25 10/02/2020   ALT 27 10/02/2020   PROT 7.2 10/02/2020   ALBUMIN 4.6 10/02/2020   CALCIUM 9.1 11/13/2020   GFRAA 104 03/02/2020    Speciality Comments: PLQ Eye Exam: 10/26/2020 WNL @ [facility]  Procedures:  No procedures  performed Allergies: Statins, Alirocumab, Aricept [donepezil], Atorvastatin, Crestor [rosuvastatin], Diazepam, Dry skin treatment [albolene], Evolocumab, Ezetimibe, Fenofibrate micronized, Fentanyl, Loratadine, Metformin, Morphine and related, Niaspan [niacin], Phentermine-topiramate, Welchol [colesevelam], Zocor [simvastatin], and Azithromycin   Assessment / Plan:     Visit Diagnoses: Rheumatoid arthritis of multiple sites with negative rheumatoid factor (HCC)-she complains of pain and discomfort in her bilateral hands and her bilateral feet.  No synovitis was noted.  She has synovial thickening.  She has been tolerating hydroxychloroquine well.  High risk medication use - Plaquenil 200 mg 1 tablet by mouth twice daily.  (MTX was discontinued in July 2019 due to lower extremity ulcers).  PLQ Eye Exam: 10/26/2020.  We will check labs today.  The recommendations regarding immunization's were discussed and the information was placed in the AVS.  Ulcer of left lower extremity, limited to breakdown of skin (HCC) - Non-healing x3 years.  She is still struggling with a nonhealing ulcer.  DDD (degenerative disc disease), cervical - She is followed by Dr. Louanne Skye.  She had limited range of motion of her cervical spine.  DDD (degenerative disc disease), lumbar - X-rays of lumbar spine on 08/19/19 consistent with multilevel spondylosis and DDD.  She is followed by Dr. Louanne Skye and Dr. Ernestina Patches.  History of total knee replacement, bilateral-she has difficulty with mobility.  She uses a cane for mobility.  Varicose veins of bilateral lower extremities with other complications - Followed by Dr. Donzetta Matters (vascular surgery).  Most recent appointment on 06/26/20.  No vascular intervention necessary at this time.   Pedal edema-she has significant bilateral  History of diabetes mellitus-she complains of neuropathy in her bilateral feet.  She has difficulty walking due to neuropathy.  She also has discomfort due to  neuropathy.  History of vitamin D deficiency  History of hypertension-her blood pressure was elevated today.  She has been advised to monitor blood pressure closely and follow-up with the PCP.  History of sleep apnea  History of coronary artery disease-a handout on dietary modifications and exercise was placed in the AVS.  History of breast cancer  Orders: Orders Placed This Encounter  Procedures   CBC with Differential/Platelet   COMPLETE METABOLIC PANEL WITH GFR    No orders of the defined types were placed in this encounter.    Follow-Up Instructions: Return in about 5 months (around 07/07/2021) for Rheumatoid arthritis.   Bo Merino, MD  Note - This record has been created using Editor, commissioning.  Chart creation errors have been sought, but may not always  have been located. Such creation errors do not reflect on  the standard of medical care.

## 2021-01-27 DIAGNOSIS — M069 Rheumatoid arthritis, unspecified: Secondary | ICD-10-CM | POA: Diagnosis not present

## 2021-01-27 DIAGNOSIS — M47812 Spondylosis without myelopathy or radiculopathy, cervical region: Secondary | ICD-10-CM | POA: Diagnosis not present

## 2021-01-27 DIAGNOSIS — M47816 Spondylosis without myelopathy or radiculopathy, lumbar region: Secondary | ICD-10-CM | POA: Diagnosis not present

## 2021-01-27 DIAGNOSIS — Z79899 Other long term (current) drug therapy: Secondary | ICD-10-CM | POA: Diagnosis not present

## 2021-01-27 DIAGNOSIS — E1140 Type 2 diabetes mellitus with diabetic neuropathy, unspecified: Secondary | ICD-10-CM | POA: Diagnosis not present

## 2021-01-29 ENCOUNTER — Other Ambulatory Visit: Payer: Self-pay | Admitting: Rheumatology

## 2021-01-29 DIAGNOSIS — M0609 Rheumatoid arthritis without rheumatoid factor, multiple sites: Secondary | ICD-10-CM

## 2021-01-29 NOTE — Telephone Encounter (Signed)
Last Visit: 09/09/2020  Next Visit: 02/04/2021  Labs: 11/13/2020, Glucose 214, Creatinine 0.56, BUN/Creatinine Ratio 32 10/02/2020 CBC normal  Eye exam: 10/26/4020   Current Dose per office note 09/09/2020,  Plaquenil 200 mg 1 tablet by mouth twice daily   DX: Rheumatoid arthritis of multiple sites with negative rheumatoid factor    Last Fill: 11/17/2020  Okay to refill PLQ?

## 2021-02-02 DIAGNOSIS — U071 COVID-19: Secondary | ICD-10-CM | POA: Diagnosis not present

## 2021-02-02 DIAGNOSIS — J9621 Acute and chronic respiratory failure with hypoxia: Secondary | ICD-10-CM | POA: Diagnosis not present

## 2021-02-02 DIAGNOSIS — R0602 Shortness of breath: Secondary | ICD-10-CM | POA: Diagnosis not present

## 2021-02-02 DIAGNOSIS — J1289 Other viral pneumonia: Secondary | ICD-10-CM | POA: Diagnosis not present

## 2021-02-03 DIAGNOSIS — K921 Melena: Secondary | ICD-10-CM | POA: Diagnosis not present

## 2021-02-04 ENCOUNTER — Ambulatory Visit: Payer: Medicare HMO | Admitting: Rheumatology

## 2021-02-04 ENCOUNTER — Other Ambulatory Visit: Payer: Self-pay

## 2021-02-04 ENCOUNTER — Encounter: Payer: Self-pay | Admitting: Rheumatology

## 2021-02-04 VITALS — BP 150/83 | HR 57 | Ht 64.0 in | Wt 241.8 lb

## 2021-02-04 DIAGNOSIS — M0609 Rheumatoid arthritis without rheumatoid factor, multiple sites: Secondary | ICD-10-CM | POA: Diagnosis not present

## 2021-02-04 DIAGNOSIS — Z96653 Presence of artificial knee joint, bilateral: Secondary | ICD-10-CM | POA: Diagnosis not present

## 2021-02-04 DIAGNOSIS — L97921 Non-pressure chronic ulcer of unspecified part of left lower leg limited to breakdown of skin: Secondary | ICD-10-CM | POA: Diagnosis not present

## 2021-02-04 DIAGNOSIS — M5136 Other intervertebral disc degeneration, lumbar region: Secondary | ICD-10-CM

## 2021-02-04 DIAGNOSIS — Z8679 Personal history of other diseases of the circulatory system: Secondary | ICD-10-CM

## 2021-02-04 DIAGNOSIS — Z8639 Personal history of other endocrine, nutritional and metabolic disease: Secondary | ICD-10-CM

## 2021-02-04 DIAGNOSIS — M5030 Other cervical disc degeneration, unspecified cervical region: Secondary | ICD-10-CM | POA: Diagnosis not present

## 2021-02-04 DIAGNOSIS — R600 Localized edema: Secondary | ICD-10-CM

## 2021-02-04 DIAGNOSIS — Z79899 Other long term (current) drug therapy: Secondary | ICD-10-CM | POA: Diagnosis not present

## 2021-02-04 DIAGNOSIS — I83893 Varicose veins of bilateral lower extremities with other complications: Secondary | ICD-10-CM | POA: Diagnosis not present

## 2021-02-04 DIAGNOSIS — Z8669 Personal history of other diseases of the nervous system and sense organs: Secondary | ICD-10-CM

## 2021-02-04 DIAGNOSIS — Z853 Personal history of malignant neoplasm of breast: Secondary | ICD-10-CM

## 2021-02-04 NOTE — Patient Instructions (Signed)
Vaccines You are taking a medication(s) that can suppress your immune system.  The following immunizations are recommended: Flu annually Covid-19  Td/Tdap (tetanus, diphtheria, pertussis) every 10 years Pneumonia (Prevnar 15 then Pneumovax 23 at least 1 year apart.  Alternatively, can take Prevnar 20 without needing additional dose) Shingrix (after age 76): 2 doses from 4 weeks to 6 months apart  Please check with your PCP to make sure you are up to date.   Heart Disease Prevention   Your inflammatory disease increases your risk of heart disease which includes heart attack, stroke, atrial fibrillation (irregular heartbeats), high blood pressure, heart failure and atherosclerosis (plaque in the arteries).  It is important to reduce your risk by:   Keep blood pressure, cholesterol, and blood sugar at healthy levels   Smoking Cessation   Maintain a healthy weight  BMI 20-25   Eat a healthy diet  Plenty of fresh fruit, vegetables, and whole grains  Limit saturated fats, foods high in sodium, and added sugars  DASH and Mediterranean diet   Increase physical activity  Recommend moderate physically activity for 150 minutes per week/ 30 minutes a day for five days a week These can be broken up into three separate ten-minute sessions during the day.   Reduce Stress  Meditation, slow breathing exercises, yoga, coloring books  Dental visits twice a year   Iliotibial Band Syndrome Rehab Ask your health care provider which exercises are safe for you. Do exercises exactly as told by your health care provider and adjust them as directed. It is normal to feel mild stretching, pulling, tightness, or discomfort as you do these exercises. Stop right away if you feel sudden pain or your pain gets significantly worse. Do not begin these exercises until told by your health care provider. Stretching and range-of-motion exercises These exercises warm up your muscles and joints and improve the movement  andflexibility of your hip and pelvis. Quadriceps stretch, prone  Lie on your abdomen (prone position) on a firm surface, such as a bed or padded floor. Bend your left / right knee and reach back to hold your ankle or pant leg. If you cannot reach your ankle or pant leg, loop a belt around your foot and grab the belt instead. Gently pull your heel toward your buttocks. Your knee should not slide out to the side. You should feel a stretch in the front of your thigh and knee (quadriceps). Hold this position for __________ seconds. Repeat __________ times. Complete this exercise __________ times a day. Iliotibial band stretch An iliotibial band is a strong band of muscle tissue that runs from the outer side of your hip to the outer side of your thigh and knee. Lie on your side with your left / right leg in the top position. Bend both of your knees and grab your left / right ankle. Stretch out your bottom arm to help you balance. Slowly bring your top knee back so your thigh goes behind your trunk. Slowly lower your top leg toward the floor until you feel a gentle stretch on the outside of your left / right hip and thigh. If you do not feel a stretch and your knee will not fall farther, place the heel of your other foot on top of your knee and pull your knee down toward the floor with your foot. Hold this position for __________ seconds. Repeat __________ times. Complete this exercise __________ times a day. Strengthening exercises These exercises build strength and endurance in your  hip and pelvis. Enduranceis the ability to use your muscles for a long time, even after they get tired. Straight leg raises, side-lying This exercise strengthens the muscles that rotate the leg at the hip and move it away from your body (hip abductors). Lie on your side with your left / right leg in the top position. Lie so your head, shoulder, hip, and knee line up. You may bend your bottom knee to help you  balance. Roll your hips slightly forward so your hips are stacked directly over each other and your left / right knee is facing forward. Tense the muscles in your outer thigh and lift your top leg 4-6 inches (10-15 cm). Hold this position for __________ seconds. Slowly lower your leg to return to the starting position. Let your muscles relax completely before doing another repetition. Repeat __________ times. Complete this exercise __________ times a day. Leg raises, prone This exercise strengthens the muscles that move the hips backward (hip extensors). Lie on your abdomen (prone position) on your bed or a firm surface. You can put a pillow under your hips if that is more comfortable for your lower back. Bend your left / right knee so your foot is straight up in the air. Squeeze your buttocks muscles and lift your left / right thigh off the bed. Do not let your back arch. Tense your thigh muscle as hard as you can without increasing any knee pain. Hold this position for __________ seconds. Slowly lower your leg to return to the starting position and allow it to relax completely. Repeat __________ times. Complete this exercise __________ times a day. Hip hike Stand sideways on a bottom step. Stand on your left / right leg with your other foot unsupported next to the step. You can hold on to a railing or wall for balance if needed. Keep your knees straight and your torso square. Then lift your left / right hip up toward the ceiling. Slowly let your left / right hip lower toward the floor, past the starting position. Your foot should get closer to the floor. Do not lean or bend your knees. Repeat __________ times. Complete this exercise __________ times a day. This information is not intended to replace advice given to you by your health care provider. Make sure you discuss any questions you have with your healthcare provider. Document Revised: 09/18/2019 Document Reviewed: 09/18/2019 Elsevier  Patient Education  [place].

## 2021-02-05 LAB — COMPLETE METABOLIC PANEL WITH GFR
AG Ratio: 1.7 (calc) (ref 1.0–2.5)
ALT: 32 U/L — ABNORMAL HIGH (ref 6–29)
AST: 53 U/L — ABNORMAL HIGH (ref 10–35)
Albumin: 4.7 g/dL (ref 3.6–5.1)
Alkaline phosphatase (APISO): 45 U/L (ref 37–153)
BUN/Creatinine Ratio: 25 (calc) — ABNORMAL HIGH (ref 6–22)
BUN: 15 mg/dL (ref 7–25)
CO2: 28 mmol/L (ref 20–32)
Calcium: 9.7 mg/dL (ref 8.6–10.4)
Chloride: 102 mmol/L (ref 98–110)
Creat: 0.59 mg/dL — ABNORMAL LOW (ref 0.60–1.00)
Globulin: 2.7 g/dL (calc) (ref 1.9–3.7)
Glucose, Bld: 151 mg/dL — ABNORMAL HIGH (ref 65–99)
Potassium: 4.4 mmol/L (ref 3.5–5.3)
Sodium: 140 mmol/L (ref 135–146)
Total Bilirubin: 0.5 mg/dL (ref 0.2–1.2)
Total Protein: 7.4 g/dL (ref 6.1–8.1)
eGFR: 93 mL/min/{1.73_m2} (ref >=60)

## 2021-02-05 LAB — CBC WITH DIFFERENTIAL/PLATELET
Absolute Monocytes: 627 cells/uL (ref 200–950)
Basophils Absolute: 20 cells/uL (ref 0–200)
Basophils Relative: 0.3 %
Eosinophils Absolute: 139 cells/uL (ref 15–500)
Eosinophils Relative: 2.1 %
HCT: 40.3 % (ref 35.0–45.0)
Hemoglobin: 13.3 g/dL (ref 11.7–15.5)
Lymphs Abs: 2713 cells/uL (ref 850–3900)
MCH: 29.8 pg (ref 27.0–33.0)
MCHC: 33 g/dL (ref 32.0–36.0)
MCV: 90.2 fL (ref 80.0–100.0)
MPV: 11.8 fL (ref 7.5–12.5)
Monocytes Relative: 9.5 %
Neutro Abs: 3102 cells/uL (ref 1500–7800)
Neutrophils Relative %: 47 %
Platelets: 182 10*3/uL (ref 140–400)
RBC: 4.47 10*6/uL (ref 3.80–5.10)
RDW: 13.5 % (ref 11.0–15.0)
Total Lymphocyte: 41.1 %
WBC: 6.6 10*3/uL (ref 3.8–10.8)

## 2021-02-05 NOTE — Progress Notes (Signed)
CBC is normal.  Glucose is elevated.  LFTs are elevated.  Please advise patient to stop meloxicam.  Elevated LFTs could be coming from other medications.  Please forward results to her PCP.

## 2021-02-21 DIAGNOSIS — E1151 Type 2 diabetes mellitus with diabetic peripheral angiopathy without gangrene: Secondary | ICD-10-CM | POA: Diagnosis not present

## 2021-02-21 DIAGNOSIS — K219 Gastro-esophageal reflux disease without esophagitis: Secondary | ICD-10-CM | POA: Diagnosis not present

## 2021-02-21 DIAGNOSIS — E785 Hyperlipidemia, unspecified: Secondary | ICD-10-CM | POA: Diagnosis not present

## 2021-02-21 DIAGNOSIS — I10 Essential (primary) hypertension: Secondary | ICD-10-CM | POA: Diagnosis not present

## 2021-02-23 DIAGNOSIS — M5030 Other cervical disc degeneration, unspecified cervical region: Secondary | ICD-10-CM | POA: Diagnosis not present

## 2021-02-23 DIAGNOSIS — M069 Rheumatoid arthritis, unspecified: Secondary | ICD-10-CM | POA: Diagnosis not present

## 2021-02-23 DIAGNOSIS — E1140 Type 2 diabetes mellitus with diabetic neuropathy, unspecified: Secondary | ICD-10-CM | POA: Diagnosis not present

## 2021-02-23 DIAGNOSIS — M47816 Spondylosis without myelopathy or radiculopathy, lumbar region: Secondary | ICD-10-CM | POA: Diagnosis not present

## 2021-02-23 DIAGNOSIS — M47894 Other spondylosis, thoracic region: Secondary | ICD-10-CM | POA: Diagnosis not present

## 2021-02-23 DIAGNOSIS — Z79899 Other long term (current) drug therapy: Secondary | ICD-10-CM | POA: Diagnosis not present

## 2021-02-23 DIAGNOSIS — R030 Elevated blood-pressure reading, without diagnosis of hypertension: Secondary | ICD-10-CM | POA: Diagnosis not present

## 2021-03-04 NOTE — Progress Notes (Signed)
Thank you for the update!

## 2021-03-05 DIAGNOSIS — J1289 Other viral pneumonia: Secondary | ICD-10-CM | POA: Diagnosis not present

## 2021-03-05 DIAGNOSIS — J9621 Acute and chronic respiratory failure with hypoxia: Secondary | ICD-10-CM | POA: Diagnosis not present

## 2021-03-05 DIAGNOSIS — U071 COVID-19: Secondary | ICD-10-CM | POA: Diagnosis not present

## 2021-03-05 DIAGNOSIS — R0602 Shortness of breath: Secondary | ICD-10-CM | POA: Diagnosis not present

## 2021-04-01 ENCOUNTER — Encounter: Payer: Self-pay | Admitting: Gastroenterology

## 2021-04-01 ENCOUNTER — Ambulatory Visit: Payer: Medicare HMO | Admitting: Gastroenterology

## 2021-04-01 VITALS — BP 130/70 | HR 64 | Ht 63.5 in | Wt 234.0 lb

## 2021-04-01 DIAGNOSIS — R195 Other fecal abnormalities: Secondary | ICD-10-CM

## 2021-04-01 DIAGNOSIS — Z9981 Dependence on supplemental oxygen: Secondary | ICD-10-CM

## 2021-04-01 DIAGNOSIS — R197 Diarrhea, unspecified: Secondary | ICD-10-CM

## 2021-04-01 DIAGNOSIS — Z86010 Personal history of colonic polyps: Secondary | ICD-10-CM

## 2021-04-01 MED ORDER — DICYCLOMINE HCL 10 MG PO CAPS
10.0000 mg | ORAL_CAPSULE | Freq: Three times a day (TID) | ORAL | 11 refills | Status: DC
Start: 2021-04-01 — End: 2022-05-11

## 2021-04-01 MED ORDER — PLENVU 140 G PO SOLR: 1.0000 | Freq: Once | ORAL | 0 refills | Status: AC

## 2021-04-01 NOTE — Progress Notes (Signed)
History of Present Illness: This is a 75 year old female referred by Crist Infante, MD for the evaluation of occult blood in stool, chronic diarrhea.  She relates a long history of loose, urgent postprandial diarrhea.  Over the past few years she has had an increase in frequency of fecal incontinence associated with an increase in fecal urgency.  She takes Imodium with some control of her symptoms.  Her reflux symptoms are controlled on daily omeprazole.  Stool Hemoccult and Hemosure were recently positive through Dr. Silvestre Mesi office.  Colonoscopy in Jan 2019 showed one tubular adenoma and moderate left colon diverticulosis.  Random colon biopsies to evaluate her diarrhea were negative.  EGD in Jan 2019 was normal. Denies weight loss, abdominal pain, constipation, change in stool caliber, melena, hematochezia, nausea, vomiting, dysphagia, chest pain.    Allergies  Allergen Reactions   Statins Other (See Comments)    Joint pain and swelling/burning   Alirocumab Other (See Comments)    (praluent) all sorts of side effects. strange.  she had burning, weakness, felt joints hurt more, felt asthma flared.   Aricept [Donepezil]     leg pains   Atorvastatin     Leg pain   Crestor [Rosuvastatin]     Other reaction(s): even 1/2 of 5 mg 2-3 days a week couldn't take due to muscle side effects.   Diazepam     "makes me crazy"   Dry Skin Treatment [Albolene]     Other reaction(s): contact dermatitis when in covid hospital   Evolocumab     Other reaction(s): felt like going to pass out.legs were weak.   Ezetimibe     leg cramps   Fenofibrate Micronized     cramps   Fentanyl     Unknown reaction   Loratadine     leg cramps   Metformin     upset stomach   Morphine And Related Other (See Comments)    hallucinations   Niaspan [Niacin]     burning   Phentermine-Topiramate     Other reaction(s): just didn't work for her.   Welchol [Colesevelam]     Leg cramps   Zocor [Simvastatin]      muscle aches   Azithromycin Rash    Legs burning    Outpatient Medications Prior to Visit  Medication Sig Dispense Refill   ACCU-CHEK GUIDE test strip      acetaminophen (TYLENOL) 500 MG tablet Take 1,000 mg by mouth See admin instructions. Take 1000 mg every night, may take 1000 mg an additional 3 times as needed for pain     albuterol (VENTOLIN HFA) 108 (90 Base) MCG/ACT inhaler Inhale 2 puffs into the lungs every 4 (four) hours as needed for wheezing or shortness of breath (cough, shortness of breath or wheezing.). 6.7 g 0   amLODipine (NORVASC) 5 MG tablet Take 5 mg by mouth in the morning.     Ascorbic Acid (VITAMIN C PO) Take 600 mg by mouth daily.     aspirin EC 81 MG tablet Take 81 mg by mouth at bedtime. Swallow whole.     b complex vitamins capsule Take 1 capsule by mouth daily.     baclofen (LIORESAL) 10 MG tablet TAKE 1 TABLET BY MOUTH TWICE A DAY AS NEEDED FOR SPASMS (Patient taking differently: Take 10 mg by mouth 2 (two) times daily.) 180 tablet 0   Bempedoic Acid (NEXLETOL) 180 MG TABS Take 180 mg by mouth daily. 30 tablet 11   betamethasone  dipropionate (DIPROLENE) 0.05 % ointment Apply 1 application topically daily as needed (eczema).     BIOTIN PO Take 500 mcg by mouth daily.     blood glucose meter kit and supplies Dispense based on patient and insurance preference. Use up to four times daily as directed. (FOR ICD-10 E10.9, E11.9). 1 each 0   buPROPion (WELLBUTRIN XL) 150 MG 24 hr tablet Take 150 mg by mouth in the morning.     busPIRone (BUSPAR) 7.5 MG tablet Take 7.5 mg by mouth 2 (two) times daily as needed (anxiety).     cholecalciferol (VITAMIN D3) 25 MCG (1000 UT) tablet Take 1,000 Units by mouth at bedtime.     clobetasol cream (TEMOVATE) 2.24 % Apply 1 application topically 2 (two) times daily.     doxazosin (CARDURA) 2 MG tablet      DULoxetine (CYMBALTA) 60 MG capsule Take 60 mg by mouth in the morning.     Echinacea 400 MG CAPS Take 400 mg by mouth daily.      ezetimibe (ZETIA) 10 MG tablet Take 1 tablet (10 mg total) by mouth daily. (Patient taking differently: Take 10 mg by mouth in the morning.) 30 tablet 0   fluticasone (FLONASE) 50 MCG/ACT nasal spray Place 2 sprays into both nostrils 2 (two) times daily as needed for allergies.     folic acid (FOLVITE) 1 MG tablet Take 2 tablets (2 mg total) by mouth daily. (Patient taking differently: Take 1 mg by mouth in the morning and at bedtime.) 180 tablet 4   gabapentin (NEURONTIN) 300 MG capsule Take 600 mg by mouth 3 (three) times daily.      HYDROcodone-acetaminophen (NORCO/VICODIN) 5-325 MG tablet Take 1 tablet by mouth every 6 (six) hours as needed (pain).     hydroxychloroquine (PLAQUENIL) 200 MG tablet TAKE 1 TABLET TWICE DAILY FOR RHEUMATOID ARTHRITIS 180 tablet 0   loperamide (IMODIUM A-D) 2 MG tablet Take 2 mg by mouth 4 (four) times daily as needed for diarrhea or loose stools.     meloxicam (MOBIC) 15 MG tablet Take 15 mg by mouth 2 (two) times a week. Wednesdays & Saturdays     montelukast (SINGULAIR) 10 MG tablet Take 10 mg by mouth at bedtime.     Multiple Vitamins-Minerals (CENTRUM SILVER PO) Take 1 tablet by mouth daily.     Multiple Vitamins-Minerals (HAIR SKIN NAILS PO) Take 1 tablet by mouth daily.     Multiple Vitamins-Minerals (ZINC PO) Take 1 tablet by mouth daily.     mupirocin ointment (BACTROBAN) 2 % Apply 1 application topically daily.     nebivolol (BYSTOLIC) 10 MG tablet Take 20 mg by mouth every evening.     nitroGLYCERIN (NITROSTAT) 0.4 MG SL tablet nitroglycerin 0.4 mg sublingual tablet (Patient taking differently: Place 0.4 mg under the tongue every 5 (five) minutes as needed for chest pain.) 25 tablet 11   nystatin (MYCOSTATIN) 100000 UNIT/ML suspension Use as directed 5 mLs in the mouth or throat 2 (two) times daily as needed (thrush).     olmesartan-hydrochlorothiazide (BENICAR HCT) 40-25 MG tablet Take 1 tablet by mouth in the morning.  5   omeprazole (PRILOSEC) 20 MG  capsule Take 20 mg by mouth in the morning.     OVER THE COUNTER MEDICATION Take 1 application by mouth daily as needed (wound care). Swedish bitters     OVER THE COUNTER MEDICATION Take 1 capsule by mouth daily. Cbd supplement     oxybutynin (DITROPAN-XL) 10 MG 24  hr tablet Take 10 mg by mouth daily.     OXYGEN in 2021 wears each night and prn during the day     Potassium 99 MG TABS Take 99 mg by mouth daily.     Probiotic Product (PROBIOTIC DAILY PO) Take 1 capsule by mouth daily.     psyllium (REGULOID) 0.52 g capsule Take 1.04 g by mouth 2 (two) times daily.     TART CHERRY PO Take 480 mg by mouth 2 (two) times daily.     Facility-Administered Medications Prior to Visit  Medication Dose Route Frequency Provider Last Rate Last Admin   sodium chloride flush (NS) 0.9 % injection 3 mL  3 mL Intravenous Q12H Martinique, Peter M, MD       Past Medical History:  Diagnosis Date   Actinic keratosis    Anxiety    Asthma    Albuterol inhaler as needed.Pulmicort neb as needed   Breast cancer ([place])    right - lumpectomy    Chronic back pain    spinal stenosis   Coronary artery disease    Dependence on nocturnal oxygen therapy 07/10/2017   Pt uses 2 liters 02 at night    Depression    takes CYmbalta and Wellbutrin daily   Diabetes mellitus    not on any meds/controlled by diet   Dyspnea    with exertion occasionally when lies down   GERD (gastroesophageal reflux disease)    takes Omeprazole daily   Headache    oocasionally   History of kidney stones    Hyperlipidemia    not on any meds   Hypertension    takes Benazepril,Bystolic,and Amlodipine daily   IBS (irritable bowel syndrome)    Joint pain    Joint swelling    Livedoid vasculitis    Nocturia    OA (osteoarthritis) of knee    OSA (obstructive sleep apnea)    Other seborrheic keratosis    Oxygen deficiency    2 liters at night per pt for OSA- no cpap    Peripheral edema    takes daily as needed   Peripheral neuropathy     Personal history of chemotherapy    Personal history of radiation therapy    Pneumonia    hx of-several yrs ago   RA (rheumatoid arthritis) ([place])    Sleep apnea    pt states she uses oxygen at night - no cpap- uses 02 2 liters at night    Urinary frequency    Urinary urgency    Weakness    numbness and tingling mainly in left leg occasionally in right.Tingling/numbness in hands   Past Surgical History:  Procedure Laterality Date   BREAST LUMPECTOMY  1997   RIGHT BREAST   CARDIAC CATHETERIZATION  04/23/2004   EF 60%   cataract surgery Bilateral    COLONOSCOPY     COLONOSCOPY WITH PROPOFOL N/A 08/07/2017   Procedure: COLONOSCOPY WITH PROPOFOL;  Surgeon: Ladene Artist, MD;  Location: [facility];  Service: Endoscopy;  Laterality: N/A;   CORONARY ARTERY BYPASS GRAFT  2005   LIMA GRAFT TO LAD, SAPHENOUS VEIN GRAFT TO THE FIRST DIAGONAL, AND LEFT RADIAL ARTERY GRAFT TO THE OM   ESOPHAGOGASTRODUODENOSCOPY (EGD) WITH PROPOFOL N/A 08/07/2017   Procedure: ESOPHAGOGASTRODUODENOSCOPY (EGD) WITH PROPOFOL;  Surgeon: Ladene Artist, MD;  Location: [facility];  Service: Endoscopy;  Laterality: N/A;   FOOT SURGERY Right    JOINT REPLACEMENT     LITHOTRIPSY  LUMBAR LAMINECTOMY/DECOMPRESSION MICRODISCECTOMY N/A 09/08/2015   Procedure: CENTRAL DECOMPRESSIVE LUMBAR LAMINECTOMIES L3-4, L4-5 AND BILATERAL HEMILAMINECTOMY L5-S1;  Surgeon: Jessy Oto, MD;  Location: [facility];  Service: Orthopedics;  Laterality: N/A;   nodule removed from left elbow     RIGHT/LEFT HEART CATH AND CORONARY/GRAFT ANGIOGRAPHY N/A 10/06/2020   Procedure: RIGHT/LEFT HEART CATH AND CORONARY/GRAFT ANGIOGRAPHY;  Surgeon: Martinique, Peter M, MD;  Location: [facility];  Service: Cardiovascular;  Laterality: N/A;   SHOULDER ARTHROSCOPY     TOTAL KNEE ARTHROPLASTY Bilateral    TOTAL SHOULDER ARTHROPLASTY     TRIPLE BYPASS  04/27/05   US ECHOCARDIOGRAPHY  01/06/2009   EF 55-60%   WRIST SURGERY Left    Social History    Socioeconomic History   Marital status: Widowed    Spouse name: Not on file   Number of children: 2   Years of education: Not on file   Highest education level: Not on file  Occupational History   Occupation: retired    Fish farm manager: UNEMPLOYED  Tobacco Use   Smoking status: Never   Smokeless tobacco: Never  Vaping Use   Vaping Use: Never used  Substance and Sexual Activity   Alcohol use: No    Alcohol/week: 0.0 standard drinks   Drug use: Never   Sexual activity: Not on file  Other Topics Concern   Not on file  Social History Narrative   Not on file   Social Determinants of Health   Financial Resource Strain: Not on file  Food Insecurity: Not on file  Transportation Needs: Not on file  Physical Activity: Not on file  Stress: Not on file  Social Connections: Not on file   Family History  Problem Relation Age of Onset   Heart disease Mother    Hyperlipidemia Mother    Lung cancer Father    Diabetes Father    Hyperlipidemia Father    Heart disease Sister    Hypertension Sister    Hyperlipidemia Sister    Heart disease Brother    Diabetes Brother    Hypertension Brother    Hyperlipidemia Brother    Lung cancer Brother    Heart disease Brother    Hypertension Brother    Hyperlipidemia Brother    Heart disease Brother    Hypertension Brother    Hyperlipidemia Brother    Heart disease Maternal Aunt    Asthma Maternal Uncle    Heart disease Maternal Uncle        Review of Systems: Pertinent positive and negative review of systems were noted in the above HPI section. All other review of systems were otherwise negative.   Physical Exam: General: Well developed, well nourished, no acute distress Head: Normocephalic and atraumatic Eyes: Sclerae anicteric, EOMI Ears: Normal auditory acuity Mouth: Not examined, mask on during Covid-19 pandemic Neck: Supple, no masses or thyromegaly Lungs: Clear throughout to auscultation Heart: Regular rate and rhythm; no  murmurs, rubs or bruits Abdomen: Soft, non tender and non distended. No masses, hepatosplenomegaly or hernias noted. Normal Bowel sounds Rectal: Deferred to colonoscopy. Musculoskeletal: Symmetrical with no gross deformities  Skin: No lesions on visible extremities Pulses:  Normal pulses noted Extremities: No clubbing, cyanosis, edema or deformities noted Neurological: Alert oriented x 4, grossly nonfocal Cervical Nodes:  No significant cervical adenopathy Inguinal Nodes: No significant inguinal adenopathy Psychological:  Alert and cooperative. Normal mood and affect   Assessment and Recommendations:  Occult blood in stool, fecal urgency, occasional fecal incontinence, chronic postprandial diarrhea,  personal history of adenomatous colon polyps.  Rule out colorectal neoplasms, AVMs, hemorrhoids, IBS, IBD, microscopic colitis and other disorders.  Begin dicyclomine 10 mg p.o. 3 times daily taken 30 minutes before meals.  Continue Imodium 1-2 p.o. 3 times daily as needed.  If her postprandial fecal urgency and diarrhea are not well controlled increase dicyclomine to 20 mg p.o. 3 times daily AC.  Schedule colonoscopy. The risks (including bleeding, perforation, infection, missed lesions, medication reactions and possible hospitalization or surgery if complications occur), benefits, and alternatives to colonoscopy with possible biopsy and possible polypectomy were discussed with the patient and they consent to proceed.  No cause for diarrhea is noted we will obtain tTG, IgA and consider EGD to further evaluate.  We discussed that this will likely be her last surveillance colonoscopy.  Given her age, comorbidities and upcoming colonoscopy future routine annual stool testing for occult blood is probably not indicated. GERD.  Follow antireflux measures and continue omeprazole 20 mg daily.  EGD in 2019 was normal. OSA maintained on nocturnal O2. Uses daytime O2 as needed. PFTs in May 2022 showed relatively  preserved lung function, more than 80% of normal. CAD.  Status post CABG in 2005. RA.  History of right breast cancer status post lumpectomy and chemoradiation.   cc: Crist Infante, MD [address] [place],  [place] [address]

## 2021-04-01 NOTE — Patient Instructions (Addendum)
We have sent the following medications to your pharmacy for you to pick up at your convenience:dicyclomine.   You have been scheduled for a colonoscopy. Please follow written instructions given to you at your visit today.  Please pick up your prep supplies at the pharmacy within the next 1-3 days. If you use inhalers (even only as needed), please bring them with you on the day of your procedure.  Due to recent changes in healthcare laws, you may see the results of your imaging and laboratory studies on MyChart before your provider has had a chance to review them.  We understand that in some cases there may be results that are confusing or concerning to you. Not all laboratory results come back in the same time frame and the provider may be waiting for multiple results in order to interpret others.  Please give [place] 48 hours in order for your provider to thoroughly review all the results before contacting the office for clarification of your results.   The [facility] providers would like to encourage you to use [facility] to communicate with providers for non-urgent requests or questions.  Due to long hold times on the telephone, sending your provider a message by [facility] may be a faster and more efficient way to get a response.  Please allow 48 business hours for a response.  Please remember that this is for non-urgent requests.   Thank you for choosing me and [facility].  Pricilla Riffle. Dagoberto Ligas., MD., [facility] Regal

## 2021-04-02 DIAGNOSIS — Z79899 Other long term (current) drug therapy: Secondary | ICD-10-CM | POA: Diagnosis not present

## 2021-04-02 DIAGNOSIS — M47894 Other spondylosis, thoracic region: Secondary | ICD-10-CM | POA: Diagnosis not present

## 2021-04-02 DIAGNOSIS — M47812 Spondylosis without myelopathy or radiculopathy, cervical region: Secondary | ICD-10-CM | POA: Diagnosis not present

## 2021-04-02 DIAGNOSIS — M5030 Other cervical disc degeneration, unspecified cervical region: Secondary | ICD-10-CM | POA: Diagnosis not present

## 2021-04-02 DIAGNOSIS — M069 Rheumatoid arthritis, unspecified: Secondary | ICD-10-CM | POA: Diagnosis not present

## 2021-04-05 DIAGNOSIS — R0602 Shortness of breath: Secondary | ICD-10-CM | POA: Diagnosis not present

## 2021-04-05 DIAGNOSIS — J1289 Other viral pneumonia: Secondary | ICD-10-CM | POA: Diagnosis not present

## 2021-04-05 DIAGNOSIS — J9621 Acute and chronic respiratory failure with hypoxia: Secondary | ICD-10-CM | POA: Diagnosis not present

## 2021-04-05 DIAGNOSIS — U071 COVID-19: Secondary | ICD-10-CM | POA: Diagnosis not present

## 2021-04-23 DIAGNOSIS — F329 Major depressive disorder, single episode, unspecified: Secondary | ICD-10-CM | POA: Diagnosis not present

## 2021-04-23 DIAGNOSIS — E1151 Type 2 diabetes mellitus with diabetic peripheral angiopathy without gangrene: Secondary | ICD-10-CM | POA: Diagnosis not present

## 2021-04-23 DIAGNOSIS — D8989 Other specified disorders involving the immune mechanism, not elsewhere classified: Secondary | ICD-10-CM | POA: Diagnosis not present

## 2021-04-23 DIAGNOSIS — I87333 Chronic venous hypertension (idiopathic) with ulcer and inflammation of bilateral lower extremity: Secondary | ICD-10-CM | POA: Diagnosis not present

## 2021-04-23 DIAGNOSIS — Z23 Encounter for immunization: Secondary | ICD-10-CM | POA: Diagnosis not present

## 2021-04-23 DIAGNOSIS — I2510 Atherosclerotic heart disease of native coronary artery without angina pectoris: Secondary | ICD-10-CM | POA: Diagnosis not present

## 2021-04-23 DIAGNOSIS — N951 Menopausal and female climacteric states: Secondary | ICD-10-CM | POA: Diagnosis not present

## 2021-04-23 DIAGNOSIS — I10 Essential (primary) hypertension: Secondary | ICD-10-CM | POA: Diagnosis not present

## 2021-04-23 DIAGNOSIS — E041 Nontoxic single thyroid nodule: Secondary | ICD-10-CM | POA: Diagnosis not present

## 2021-04-23 DIAGNOSIS — J441 Chronic obstructive pulmonary disease with (acute) exacerbation: Secondary | ICD-10-CM | POA: Diagnosis not present

## 2021-04-23 DIAGNOSIS — J9611 Chronic respiratory failure with hypoxia: Secondary | ICD-10-CM | POA: Diagnosis not present

## 2021-05-05 DIAGNOSIS — J1289 Other viral pneumonia: Secondary | ICD-10-CM | POA: Diagnosis not present

## 2021-05-05 DIAGNOSIS — J9621 Acute and chronic respiratory failure with hypoxia: Secondary | ICD-10-CM | POA: Diagnosis not present

## 2021-05-05 DIAGNOSIS — U071 COVID-19: Secondary | ICD-10-CM | POA: Diagnosis not present

## 2021-05-05 DIAGNOSIS — R0602 Shortness of breath: Secondary | ICD-10-CM | POA: Diagnosis not present

## 2021-05-06 DIAGNOSIS — M47894 Other spondylosis, thoracic region: Secondary | ICD-10-CM | POA: Diagnosis not present

## 2021-05-06 DIAGNOSIS — M069 Rheumatoid arthritis, unspecified: Secondary | ICD-10-CM | POA: Diagnosis not present

## 2021-05-06 DIAGNOSIS — R030 Elevated blood-pressure reading, without diagnosis of hypertension: Secondary | ICD-10-CM | POA: Diagnosis not present

## 2021-05-06 DIAGNOSIS — Z79899 Other long term (current) drug therapy: Secondary | ICD-10-CM | POA: Diagnosis not present

## 2021-05-06 DIAGNOSIS — M5030 Other cervical disc degeneration, unspecified cervical region: Secondary | ICD-10-CM | POA: Diagnosis not present

## 2021-05-13 ENCOUNTER — Encounter (HOSPITAL_COMMUNITY): Payer: Self-pay | Admitting: Gastroenterology

## 2021-05-21 ENCOUNTER — Telehealth: Payer: Self-pay | Admitting: Gastroenterology

## 2021-05-21 NOTE — Telephone Encounter (Signed)
Inbound call from pt requesting a call back stating that her daughter has tested positive for COVID on yesterday. Pt has a procedure on 05/24/21. Please advise. Thank you.

## 2021-05-21 NOTE — Telephone Encounter (Signed)
Discussed with [name] at [facility]. Patient must be rescheduled. I will call central scheduling on Monday am and try and reschedule her for 12/1.  She understands I will call her back on Monday

## 2021-05-24 ENCOUNTER — Ambulatory Visit (HOSPITAL_COMMUNITY): Admission: RE | Admit: 2021-05-24 | Payer: Medicare HMO | Source: Home / Self Care | Admitting: Gastroenterology

## 2021-05-24 SURGERY — COLONOSCOPY WITH PROPOFOL
Anesthesia: Monitor Anesthesia Care

## 2021-05-24 NOTE — Telephone Encounter (Signed)
Patient has been rescheduled to 12/1 she understands to arrive at 9:45 for a 11:15 appt

## 2021-06-05 DIAGNOSIS — J1289 Other viral pneumonia: Secondary | ICD-10-CM | POA: Diagnosis not present

## 2021-06-05 DIAGNOSIS — J9621 Acute and chronic respiratory failure with hypoxia: Secondary | ICD-10-CM | POA: Diagnosis not present

## 2021-06-05 DIAGNOSIS — R0602 Shortness of breath: Secondary | ICD-10-CM | POA: Diagnosis not present

## 2021-06-05 DIAGNOSIS — U071 COVID-19: Secondary | ICD-10-CM | POA: Diagnosis not present

## 2021-06-07 DIAGNOSIS — M47894 Other spondylosis, thoracic region: Secondary | ICD-10-CM | POA: Diagnosis not present

## 2021-06-07 DIAGNOSIS — Z79899 Other long term (current) drug therapy: Secondary | ICD-10-CM | POA: Diagnosis not present

## 2021-06-07 DIAGNOSIS — M5030 Other cervical disc degeneration, unspecified cervical region: Secondary | ICD-10-CM | POA: Diagnosis not present

## 2021-06-07 DIAGNOSIS — M069 Rheumatoid arthritis, unspecified: Secondary | ICD-10-CM | POA: Diagnosis not present

## 2021-06-07 DIAGNOSIS — M47812 Spondylosis without myelopathy or radiculopathy, cervical region: Secondary | ICD-10-CM | POA: Diagnosis not present

## 2021-06-08 ENCOUNTER — Other Ambulatory Visit: Payer: Self-pay

## 2021-06-08 ENCOUNTER — Encounter (HOSPITAL_COMMUNITY): Payer: Self-pay | Admitting: Gastroenterology

## 2021-06-23 ENCOUNTER — Other Ambulatory Visit: Payer: Self-pay | Admitting: Physician Assistant

## 2021-06-23 DIAGNOSIS — M0609 Rheumatoid arthritis without rheumatoid factor, multiple sites: Secondary | ICD-10-CM

## 2021-06-23 NOTE — Telephone Encounter (Signed)
Next Visit: 07/08/2021  Last Visit: 02/04/2021  Labs: 02/04/2021 CBC is normal.  Glucose is elevated.  LFTs are elevated  Eye exam: 10/26/2020 WNL   Current Dose per office note 02/04/2021:  Plaquenil 200 mg 1 tablet by mouth twice daily.  [id number] arthritis of multiple sites with negative rheumatoid factor  Last Fill: 01/29/2021  Okay to refill Plaquenil?

## 2021-06-24 ENCOUNTER — Ambulatory Visit (HOSPITAL_COMMUNITY): Payer: Medicare HMO | Admitting: Certified Registered"

## 2021-06-24 ENCOUNTER — Encounter (HOSPITAL_COMMUNITY)
Admission: RE | Disposition: A | Discharge status: Home / Self Care | Payer: Self-pay | Source: Ambulatory Visit | Attending: Gastroenterology

## 2021-06-24 ENCOUNTER — Ambulatory Visit (HOSPITAL_COMMUNITY)
Admission: RE | Admit: 2021-06-24 | Discharge: 2021-06-24 | Disposition: A | Discharge status: Home / Self Care | Payer: Medicare HMO | Source: Ambulatory Visit | Attending: Gastroenterology | Admitting: Gastroenterology

## 2021-06-24 ENCOUNTER — Other Ambulatory Visit: Payer: Self-pay

## 2021-06-24 ENCOUNTER — Encounter (HOSPITAL_COMMUNITY): Payer: Self-pay | Admitting: Gastroenterology

## 2021-06-24 DIAGNOSIS — K5730 Diverticulosis of large intestine without perforation or abscess without bleeding: Secondary | ICD-10-CM | POA: Diagnosis not present

## 2021-06-24 DIAGNOSIS — E1142 Type 2 diabetes mellitus with diabetic polyneuropathy: Secondary | ICD-10-CM | POA: Diagnosis not present

## 2021-06-24 DIAGNOSIS — Z6839 Body mass index (BMI) 39.0-39.9, adult: Secondary | ICD-10-CM | POA: Diagnosis not present

## 2021-06-24 DIAGNOSIS — Z951 Presence of aortocoronary bypass graft: Secondary | ICD-10-CM | POA: Insufficient documentation

## 2021-06-24 DIAGNOSIS — R197 Diarrhea, unspecified: Secondary | ICD-10-CM | POA: Insufficient documentation

## 2021-06-24 DIAGNOSIS — R195 Other fecal abnormalities: Secondary | ICD-10-CM

## 2021-06-24 DIAGNOSIS — I2510 Atherosclerotic heart disease of native coronary artery without angina pectoris: Secondary | ICD-10-CM | POA: Insufficient documentation

## 2021-06-24 DIAGNOSIS — K640 First degree hemorrhoids: Secondary | ICD-10-CM | POA: Diagnosis not present

## 2021-06-24 DIAGNOSIS — D122 Benign neoplasm of ascending colon: Secondary | ICD-10-CM | POA: Insufficient documentation

## 2021-06-24 DIAGNOSIS — K219 Gastro-esophageal reflux disease without esophagitis: Secondary | ICD-10-CM | POA: Diagnosis not present

## 2021-06-24 DIAGNOSIS — E669 Obesity, unspecified: Secondary | ICD-10-CM | POA: Diagnosis not present

## 2021-06-24 DIAGNOSIS — R152 Fecal urgency: Secondary | ICD-10-CM | POA: Diagnosis not present

## 2021-06-24 DIAGNOSIS — G4733 Obstructive sleep apnea (adult) (pediatric): Secondary | ICD-10-CM | POA: Diagnosis not present

## 2021-06-24 DIAGNOSIS — J45909 Unspecified asthma, uncomplicated: Secondary | ICD-10-CM | POA: Diagnosis not present

## 2021-06-24 DIAGNOSIS — F419 Anxiety disorder, unspecified: Secondary | ICD-10-CM | POA: Insufficient documentation

## 2021-06-24 DIAGNOSIS — M1990 Unspecified osteoarthritis, unspecified site: Secondary | ICD-10-CM | POA: Insufficient documentation

## 2021-06-24 DIAGNOSIS — K635 Polyp of colon: Secondary | ICD-10-CM | POA: Diagnosis not present

## 2021-06-24 DIAGNOSIS — D120 Benign neoplasm of cecum: Secondary | ICD-10-CM | POA: Diagnosis not present

## 2021-06-24 DIAGNOSIS — Z86010 Personal history of colonic polyps: Secondary | ICD-10-CM | POA: Insufficient documentation

## 2021-06-24 DIAGNOSIS — K589 Irritable bowel syndrome without diarrhea: Secondary | ICD-10-CM | POA: Diagnosis not present

## 2021-06-24 DIAGNOSIS — K649 Unspecified hemorrhoids: Secondary | ICD-10-CM | POA: Diagnosis not present

## 2021-06-24 DIAGNOSIS — I10 Essential (primary) hypertension: Secondary | ICD-10-CM | POA: Diagnosis not present

## 2021-06-24 DIAGNOSIS — F32A Depression, unspecified: Secondary | ICD-10-CM | POA: Insufficient documentation

## 2021-06-24 HISTORY — PX: POLYPECTOMY: SHX5525

## 2021-06-24 HISTORY — DX: COVID-19: U07.1

## 2021-06-24 HISTORY — PX: BIOPSY: SHX5522

## 2021-06-24 HISTORY — PX: COLONOSCOPY WITH PROPOFOL: SHX5780

## 2021-06-24 LAB — GLUCOSE, CAPILLARY: Glucose-Capillary: 128 mg/dL — ABNORMAL HIGH (ref 70–99)

## 2021-06-24 SURGERY — COLONOSCOPY WITH PROPOFOL
Anesthesia: Monitor Anesthesia Care

## 2021-06-24 MED ORDER — LACTATED RINGERS IV SOLN
INTRAVENOUS | Status: AC | PRN
  Administered 2021-06-24: 1000 mL via INTRAVENOUS

## 2021-06-24 MED ORDER — PROPOFOL 10 MG/ML IV BOLUS
INTRAVENOUS | Status: DC | PRN
  Administered 2021-06-24 (×3): 20 mg via INTRAVENOUS

## 2021-06-24 MED ORDER — ONDANSETRON HCL 4 MG/2ML IJ SOLN
INTRAMUSCULAR | Status: DC | PRN
  Administered 2021-06-24: 4 mg via INTRAVENOUS

## 2021-06-24 MED ORDER — PROPOFOL 500 MG/50ML IV EMUL
INTRAVENOUS | Status: DC | PRN
  Administered 2021-06-24: 125 ug/kg/min via INTRAVENOUS

## 2021-06-24 SURGICAL SUPPLY — 22 items

## 2021-06-24 NOTE — Discharge Instructions (Signed)

## 2021-06-24 NOTE — Progress Notes (Signed)
Office Visit Note  Patient: [name]             Date of Birth: [birth year]           MRN: [id number]             PCP: Crist Infante, MD Referring: Crist Infante, MD Visit Date: 07/08/2021 Occupation: @GUAROCC @  Subjective:  Pain in both hands   History of Present Illness: JAKALA HERFORD is a 75 y.o. female with history of seronegative rheumatoid arthritis and DDD. She is taking plaquenil 200 mg 1 tablet by mouth twice daily. She is tolerating plaquenil without any side effects.  She continues to have chronic pain in both hands due to previous damage from rheumatoid arthritis and underlying osteoarthritis.  She has been experiencing stiffness in both hands on a daily basis and has difficulty with grip strength.  Her right ring finger and left middle finger have been triggering intermittently.  She has been having discomfort in both CMC joints.  She states her right knee replacement has been doing well.  The left knee replacement has been buckling on occasion.  She has been using a cane to assist with ambulation.  She plans on restarting home exercises to increase her strength.    Activities of Daily Living:  Patient reports morning stiffness for 2 hours.   Patient Reports nocturnal pain.  Difficulty dressing/grooming: Reports Difficulty climbing stairs: Reports Difficulty getting out of chair: Reports Difficulty using hands for taps, buttons, cutlery, and/or writing: Reports  Review of Systems  Constitutional:  Positive for fatigue.  HENT:  Positive for mouth dryness. Negative for mouth sores and nose dryness.   Eyes:  Negative for pain, itching and dryness.  Respiratory:  Positive for shortness of breath. Negative for difficulty breathing.   Cardiovascular:  Positive for chest pain. Negative for palpitations.  Gastrointestinal:  Positive for diarrhea. Negative for blood in stool and constipation.  Endocrine: Negative for increased urination.  Genitourinary:  Negative for  difficulty urinating.  Musculoskeletal:  Positive for joint pain, joint pain, joint swelling, myalgias, morning stiffness, muscle tenderness and myalgias.  Skin:  Positive for color change and ulcers. Negative for rash and redness.  Allergic/Immunologic: Positive for susceptible to infections.  Neurological:  Positive for numbness, headaches and memory loss. Negative for dizziness.  Hematological:  Positive for bruising/bleeding tendency.  Psychiatric/Behavioral:  Negative for confusion.    PMFS History:  Patient Active Problem List   Diagnosis Date Noted   Benign neoplasm of ascending colon    Hx of adenomatous colonic polyps    Occult blood in stools    Unstable angina ([place]) 10/06/2020   Acute on chronic respiratory failure with hypoxia ([place]) 02/28/2019   OSA on CPAP 02/28/2019   Diabetes mellitus type 2, uncontrolled, with complications 44/09/4740   Breast cancer ([place]) 02/27/2019   History of cancer chemotherapy 02/27/2019   History of radiation therapy 02/27/2019   Diabetic neuropathy ([place]) 02/27/2019   HLD (hyperlipidemia) 02/27/2019   Essential hypertension 02/27/2019   QT prolongation 02/27/2019   Pneumonia due to COVID-19 virus 02/24/2019   Diarrhea    Benign neoplasm of cecum    Abdominal pain, epigastric    Gastroesophageal reflux disease    Dependence on nocturnal oxygen therapy 07/10/2017   Open wound of left lower leg 12/26/2016   Vitamin D deficiency 09/14/2016   History of total knee replacement, bilateral 09/14/2016   DJD (degenerative joint disease), cervical 09/14/2016   Spondylosis of lumbar region without  myelopathy or radiculopathy 09/14/2016   High risk medication use 05/20/2016   Spinal stenosis, lumbar region, with neurogenic claudication 09/08/2015    Class: Chronic   OSA (obstructive sleep apnea) 10/17/2013   Dyspnea 09/04/2013   Atypical chest pain 09/04/2013   Morbid obesity ([place]) 03/21/2012   Cancer of lower-outer quadrant of female breast ([place])  03/15/2012   Diabetes mellitus, type II ([place]) 02/22/2011   Coronary artery disease    Hypertension    Hyperlipidemia    OA (osteoarthritis) of knee     Past Medical History:  Diagnosis Date   Actinic keratosis    Anxiety    Asthma    Albuterol inhaler as needed.Pulmicort neb as needed   Breast cancer ([place])    right - lumpectomy    Chronic back pain    spinal stenosis   Coronary artery disease    COVID    uses o2 since at hs   Dependence on nocturnal oxygen therapy 07/10/2017   Pt uses 2 liters 02 at night    Depression    takes CYmbalta and Wellbutrin daily   Diabetes mellitus    not on any meds/controlled by diet   Dyspnea    with exertion occasionally when lies down   GERD (gastroesophageal reflux disease)    takes Omeprazole daily   Headache    oocasionally   History of kidney stones    Hyperlipidemia    not on any meds   Hypertension    takes Benazepril,Bystolic,and Amlodipine daily   IBS (irritable bowel syndrome)    Joint pain    Joint swelling    Livedoid vasculitis    Nocturia    OA (osteoarthritis) of knee    Other seborrheic keratosis    Oxygen deficiency    2 liters at night per pt for OSA- no cpap    Peripheral edema    takes daily as needed   Peripheral neuropathy    Personal history of chemotherapy    Personal history of radiation therapy    Pneumonia    hx of-several yrs ago   RA (rheumatoid arthritis) ([place])    Urinary frequency    Urinary urgency    Weakness    numbness and tingling mainly in left leg occasionally in right.Tingling/numbness in hands    Family History  Problem Relation Age of Onset   Heart disease Mother    Hyperlipidemia Mother    Lung cancer Father    Diabetes Father    Hyperlipidemia Father    Heart disease Sister    Hypertension Sister    Hyperlipidemia Sister    Heart disease Brother    Diabetes Brother    Hypertension Brother    Hyperlipidemia Brother    Lung cancer Brother    Heart disease Brother     Hypertension Brother    Hyperlipidemia Brother    Heart disease Brother    Hypertension Brother    Hyperlipidemia Brother    Heart disease Maternal Aunt    Asthma Maternal Uncle    Heart disease Maternal Uncle    Past Surgical History:  Procedure Laterality Date   BIOPSY  06/24/2021   Procedure: BIOPSY;  Surgeon: Ladene Artist, MD;  Location: [facility];  Service: Endoscopy;;   BREAST LUMPECTOMY  1997   RIGHT BREAST   CARDIAC CATHETERIZATION  04/23/2004   EF 60%   cataract surgery Bilateral    COLONOSCOPY     COLONOSCOPY WITH PROPOFOL N/A 08/07/2017   Procedure: COLONOSCOPY  WITH PROPOFOL;  Surgeon: Ladene Artist, MD;  Location: [facility];  Service: Endoscopy;  Laterality: N/A;   COLONOSCOPY WITH PROPOFOL N/A 06/24/2021   Procedure: COLONOSCOPY WITH PROPOFOL;  Surgeon: Ladene Artist, MD;  Location: [facility];  Service: Endoscopy;  Laterality: N/A;   CORONARY ARTERY BYPASS GRAFT  2005   LIMA GRAFT TO LAD, SAPHENOUS VEIN GRAFT TO THE FIRST DIAGONAL, AND LEFT RADIAL ARTERY GRAFT TO THE OM   ESOPHAGOGASTRODUODENOSCOPY (EGD) WITH PROPOFOL N/A 08/07/2017   Procedure: ESOPHAGOGASTRODUODENOSCOPY (EGD) WITH PROPOFOL;  Surgeon: Ladene Artist, MD;  Location: [facility];  Service: Endoscopy;  Laterality: N/A;   FOOT SURGERY Right    JOINT REPLACEMENT     LITHOTRIPSY     LUMBAR LAMINECTOMY/DECOMPRESSION MICRODISCECTOMY N/A 09/08/2015   Procedure: CENTRAL DECOMPRESSIVE LUMBAR LAMINECTOMIES L3-4, L4-5 AND BILATERAL HEMILAMINECTOMY L5-S1;  Surgeon: Jessy Oto, MD;  Location: [facility];  Service: Orthopedics;  Laterality: N/A;   nodule removed from left elbow     POLYPECTOMY  06/24/2021   Procedure: POLYPECTOMY;  Surgeon: Ladene Artist, MD;  Location: [facility];  Service: Endoscopy;;   RIGHT/LEFT HEART CATH AND CORONARY/GRAFT ANGIOGRAPHY N/A 10/06/2020   Procedure: RIGHT/LEFT HEART CATH AND CORONARY/GRAFT ANGIOGRAPHY;  Surgeon: Martinique, Peter M, MD;  Location: [facility];   Service: Cardiovascular;  Laterality: N/A;   SHOULDER ARTHROSCOPY     TOTAL KNEE ARTHROPLASTY Bilateral    TOTAL SHOULDER ARTHROPLASTY     TRIPLE BYPASS  04/27/05   US ECHOCARDIOGRAPHY  01/06/2009   EF 55-60%   WRIST SURGERY Left    Social History   Social History Narrative   Not on file   Immunization History  Administered Date(s) Administered   Influenza Split 04/24/2013   PFIZER(Purple Top)SARS-COV-2 Vaccination 09/01/2019, 09/25/2019, 04/04/2020   Pneumococcal Conjugate-13 08/26/2016   Zoster Recombinat (Shingrix) 10/11/2017, 12/22/2017     Objective: Vital Signs: BP (!) 146/81 (BP Location: Left Arm, Patient Position: Sitting, Cuff Size: Large)   Pulse (!) 53   Ht 5\' 4"  (1.626 m)   Wt 233 lb (105.7 kg)   BMI 39.99 kg/m    Physical Exam Vitals and nursing note reviewed.  Constitutional:      Appearance: She is well-developed.  HENT:     Head: Normocephalic and atraumatic.  Eyes:     Conjunctiva/sclera: Conjunctivae normal.  Cardiovascular:     Rate and Rhythm: Normal rate and regular rhythm.     Heart sounds: Normal heart sounds.  Pulmonary:     Effort: Pulmonary effort is normal.     Breath sounds: Normal breath sounds.  Abdominal:     General: Bowel sounds are normal.     Palpations: Abdomen is soft.  Musculoskeletal:     Cervical back: Normal range of motion.  Lymphadenopathy:     Cervical: No cervical adenopathy.  Skin:    General: Skin is warm and dry.     Capillary Refill: Capillary refill takes less than 2 seconds.  Neurological:     Mental Status: She is alert and oriented to person, place, and time.  Psychiatric:        Behavior: Behavior normal.     Musculoskeletal Exam: C-spine limited ROM with lateral rotation.  Postural thoracic kyphosis.  Midline spinal tenderness in the lumbar region.  Difficult to assess lumbar ROM and hip ROM while in seated position.  Shoulder joints, elbow joints, wrist joints, MCPs, PIPs, and DIPs good ROM with no  synovitis.  Thickening over MCPs but no  synovitis noted.  PIP and DIP thickening consistent with OA of both hands.  Right CMC joint prominence. Complete fist formation bilaterally.  Bilateral knee replacements have good ROM. Ankle joints have good ROM with no tenderness.  Pedal edema noted bilaterally.    CDAI Exam: CDAI Score: 0.6  Patient Global: 3 mm; Provider Global: 3 mm Swollen: 0 ; Tender: 0  Joint Exam 07/08/2021   No joint exam has been documented for this visit   There is currently no information documented on the homunculus. Go to the Rheumatology activity and complete the homunculus joint exam.  Investigation: No additional findings.  Imaging: No results found.  Recent Labs: Lab Results  Component Value Date   WBC 6.6 02/04/2021   HGB 13.3 02/04/2021   PLT 182 02/04/2021   NA 140 02/04/2021   K 4.4 02/04/2021   CL 102 02/04/2021   CO2 28 02/04/2021   GLUCOSE 151 (H) 02/04/2021   BUN 15 02/04/2021   CREATININE 0.59 (L) 02/04/2021   BILITOT 0.5 02/04/2021   ALKPHOS 54 10/02/2020   AST 53 (H) 02/04/2021   ALT 32 (H) 02/04/2021   PROT 7.4 02/04/2021   ALBUMIN 4.6 10/02/2020   CALCIUM 9.7 02/04/2021   GFRAA 104 03/02/2020    Speciality Comments: PLQ Eye Exam: 10/26/2020 WNL @ [facility]   November 18, 2020 high-resolution CT-according to Dr. Elsworth Soho lung functions were preserved and no immunosuppressive therapy needed.  Procedures:  No procedures performed Allergies: Statins, Alirocumab, Aricept [donepezil], Atorvastatin, Crestor [rosuvastatin], Diazepam, Dry skin treatment [albolene], Evolocumab, Ezetimibe, Fenofibrate micronized, Fentanyl, Loratadine, Metformin, Morphine and related, Niaspan [niacin], Phentermine-topiramate, Welchol [colesevelam], Zocor [simvastatin], and Azithromycin   Assessment / Plan:     Visit Diagnoses: Rheumatoid arthritis of multiple sites with negative rheumatoid factor ([place]): She has no synovitis on examination today.  She  continues to have chronic pain in both hands due to underlying osteoarthritis and previous damage from rheumatoid arthritis.  She has mobile thickening over MCP joints but no active synovitis was noted.  She has PIP, DIP, and CMC joint thickening and prominence bilaterally.  Complete fist formation was noted.  She is currently taking Plaquenil 200 mg 1 tablet by mouth twice daily.  She is tolerating Plaquenil without any side effects and has not missed any doses recently.  Discussed the importance of joint protection and muscle strengthening.  She was given a handout of hand exercises to perform.  We also discussed the use of arthritis compression gloves.  She will remain on the current treatment regimen.  She does not need a refill of Plaquenil at this time.  Lab work will be updated today while she is in the office to monitor for drug toxicity.  She was advised to notify us if she develops signs or symptoms of a flare.  She will follow-up in the office in 5 months.  High risk medication use - Plaquenil 200 mg 1 tablet by mouth twice daily.  (MTX was discontinued in July 2019 due to lower extremity ulcers).   PLQ Eye Exam: 10/26/2020 WNL @ [facility].  CBC and CMP updated on 02/04/21.  She is due to update lab work today. Orders for CBC and CMP released.- Plan: CBC with Differential/Platelet, COMPLETE METABOLIC PANEL WITH GFR  Ulcer of left lower extremity, limited to breakdown of skin (HCC) - Non-healing x3 years+. Pedal edema noted bilaterally.  Unable to use an immunosuppressive agents due to nonhealing ulcer.   DDD (degenerative disc disease), cervical -  She is followed by Dr. Louanne Skye.  She has limited ROM with lateral rotation.   DDD (degenerative disc disease), lumbar -  X-rays of lumbar spine on 08/19/19 consistent with multilevel spondylosis and DDD.  She is followed by Dr. Louanne Skye and Dr. Ernestina Patches. Chronic pain.  Using a cane to assist with ambulation.   History of total knee replacement,  bilateral: Right-doing well.  Left-She has been experiencing a buckling sensation in the left knee intermittently.  She has good ROM of both knee replacements on examination today.  She has been using a cane to assist with ambulation. Discussed the importance of lower extremity muscle strengthening and fall prevention.  Offered a referral to physical therapy but she declined at this time.  She plans on performing home exercises and increasing her walking regimen using a walker for assistance.   Other medical conditions are listed as follows:   Varicose veins of bilateral lower extremities with other complications - Followed by Dr. Donzetta Matters (vascular surgery).   History of hypertension: BP was 146/81 today in the office.   Pedal edema: Unchanged.   History of diabetes mellitus  History of coronary artery disease  History of sleep apnea  History of vitamin D deficiency  History of breast cancer  Orders: Orders Placed This Encounter  Procedures   CBC with Differential/Platelet   COMPLETE METABOLIC PANEL WITH GFR    No orders of the defined types were placed in this encounter.    Follow-Up Instructions: Return in about 5 months (around 12/06/2021) for Rheumatoid arthritis, DDD.   Ofilia Neas, PA-C  Note - This record has been created using Dragon software.  Chart creation errors have been sought, but may not always  have been located. Such creation errors do not reflect on  the standard of medical care.

## 2021-06-24 NOTE — Transfer of Care (Signed)
Immediate Anesthesia Transfer of Care Note  Patient: [name]  Procedure(s) Performed: COLONOSCOPY WITH PROPOFOL POLYPECTOMY BIOPSY  Patient Location: PACU  Anesthesia Type:MAC  Level of Consciousness: awake  Airway & Oxygen Therapy: Patient Spontanous Breathing and Patient connected to face mask oxygen  Post-op Assessment: Report given to RN, Post -op Vital signs reviewed and stable and Patient moving all extremities X 4  Post vital signs: Reviewed and stable  Last Vitals:  Vitals Value Taken Time  BP 170/68   Temp    Pulse 63   Resp    SpO2 100     Last Pain:  Vitals:   06/24/21 1025  TempSrc: Oral  PainSc: 0-No pain         Complications: No notable events documented.

## 2021-06-24 NOTE — Anesthesia Preprocedure Evaluation (Signed)
Anesthesia Evaluation  Patient identified by MRN, date of birth, ID band Patient awake    Reviewed: Allergy & Precautions, NPO status , Patient's Chart, lab work & pertinent test results  History of Anesthesia Complications Negative for: history of anesthetic complications  Airway Mallampati: II  TM Distance: >3 FB     Dental  (+) Edentulous Upper, Partial Lower, Dental Advisory Given   Pulmonary shortness of breath, asthma , sleep apnea , pneumonia,    breath sounds clear to auscultation       Cardiovascular hypertension, Pt. on medications + angina + CAD and + CABG   Rhythm:Regular Rate:Normal  Left ventricle: Wall thickness was increased in a pattern of  moderate LVH. Systolic function was normal. The estimated  ejection fraction was in the range of 55% to 60%. Features are  consistent with a pseudonormal left ventricular filling pattern,  with concomitant abnormal relaxation and increased filling  pressure (grade 2 diastolic dysfunction).    ? Prox LAD to Mid LAD lesion is 100% stenosed. ? 1st Diag lesion is 80% stenosed. ? 1st Mrg lesion is 100% stenosed. ? 1st Sept lesion is 90% stenosed. ? RPDA lesion is 90% stenosed. ? 2nd RPL lesion is 70% stenosed. ? RPAV lesion is 50% stenosed. ? SVG graft was visualized by angiography. ? Origin to Prox Graft lesion is 100% stenosed. ? Left radial artery graft was visualized by angiography and is normal in caliber. ? LIMA graft was visualized by non-selective angiography and is normal in caliber. ? The left ventricular systolic function is normal. ? LV end diastolic pressure is normal. ? The left ventricular ejection fraction is 55-65% by visual estimate. ? Hemodynamic findings consistent with mild pulmonary hypertension.   1. 3 vessel obstructive CAD    - 100% mid LAD    - 80% first diagonal, 90% first septal perforator    - 100% OM1    - 90% mid PDA, 70%  PLOM2 2. Patent LIMA to the LAD 3. Patent radial artery graft to the OM1 4. Occluded SVG to diagonal 5. Normal LV function 6. Normal LV filling pressures mean PCWP 12 mm Hg 7. Mild pulmonary HTN with PA mean pressure 25 mm Hg 8. Normal cardiac output.  Plan: recommend continued medical therapy. While the diagonal and PDA could be causing some ischemia these vessels are relatively small and not ideal for PCI. I suspect based on these findings that most of her symptoms are more related to pulmonary disease and deconditioning/obesity.     Neuro/Psych  Headaches, PSYCHIATRIC DISORDERS Anxiety Depression  Neuromuscular disease    GI/Hepatic Neg liver ROS, GERD  ,  Endo/Other  diabetesLab Results      Component                Value               Date                      HGBA1C                   8.1 (H)             02/26/2019             Renal/GU negative Renal ROS     Musculoskeletal  (+) Arthritis ,   Abdominal   Peds  Hematology Lab Results      Component  Value               Date                      WBC                      6.6                 02/04/2021                HGB                      13.3                02/04/2021                HCT                      40.3                02/04/2021                MCV                      90.2                02/04/2021                PLT                      182                 02/04/2021              Anesthesia Other Findings   Reproductive/Obstetrics                             Anesthesia Physical Anesthesia Plan  ASA: 4  Anesthesia Plan: MAC   Post-op Pain Management: Minimal or no pain anticipated   Induction: Intravenous  PONV Risk Score and Plan: 2 and Propofol infusion and Treatment may vary due to age or medical condition  Airway Management Planned: Nasal Cannula  Additional Equipment: None  Intra-op Plan:   Post-operative Plan:   Informed Consent: I have  reviewed the patients History and Physical, chart, labs and discussed the procedure including the risks, benefits and alternatives for the proposed anesthesia with the patient or authorized representative who has indicated his/her understanding and acceptance.     Dental advisory given  Plan Discussed with: CRNA and Anesthesiologist  Anesthesia Plan Comments:         Anesthesia Quick Evaluation

## 2021-06-24 NOTE — H&P (Signed)
History & Physical  Primary Care Physician:  Crist Infante, MD Primary Gastroenterologist: Lucio Edward, MD  CHIEF COMPLAINT:   FIT positive, diarrhea, history of colon polyps  HPI: [name] is a 75 y.o. female FIT positive, diarrhea, fecal urgency. history of colon polyps for colonoscopy.    Past Medical History:  Diagnosis Date   Actinic keratosis    Anxiety    Asthma    Albuterol inhaler as needed.Pulmicort neb as needed   Breast cancer ([place])    right - lumpectomy    Chronic back pain    spinal stenosis   Coronary artery disease    COVID    uses o2 since at hs   Dependence on nocturnal oxygen therapy 07/10/2017   Pt uses 2 liters 02 at night    Depression    takes CYmbalta and Wellbutrin daily   Diabetes mellitus    not on any meds/controlled by diet   Dyspnea    with exertion occasionally when lies down   GERD (gastroesophageal reflux disease)    takes Omeprazole daily   Headache    oocasionally   History of kidney stones    Hyperlipidemia    not on any meds   Hypertension    takes Benazepril,Bystolic,and Amlodipine daily   IBS (irritable bowel syndrome)    Joint pain    Joint swelling    Livedoid vasculitis    Nocturia    OA (osteoarthritis) of knee    Other seborrheic keratosis    Oxygen deficiency    2 liters at night per pt for OSA- no cpap    Peripheral edema    takes daily as needed   Peripheral neuropathy    Personal history of chemotherapy    Personal history of radiation therapy    Pneumonia    hx of-several yrs ago   RA (rheumatoid arthritis) ([place])    Urinary frequency    Urinary urgency    Weakness    numbness and tingling mainly in left leg occasionally in right.Tingling/numbness in hands    Past Surgical History:  Procedure Laterality Date   BREAST LUMPECTOMY  1997   RIGHT BREAST   CARDIAC CATHETERIZATION  04/23/2004   EF 60%   cataract surgery Bilateral    COLONOSCOPY     COLONOSCOPY WITH PROPOFOL N/A 08/07/2017    Procedure: COLONOSCOPY WITH PROPOFOL;  Surgeon: Ladene Artist, MD;  Location: [facility];  Service: Endoscopy;  Laterality: N/A;   CORONARY ARTERY BYPASS GRAFT  2005   LIMA GRAFT TO LAD, SAPHENOUS VEIN GRAFT TO THE FIRST DIAGONAL, AND LEFT RADIAL ARTERY GRAFT TO THE OM   ESOPHAGOGASTRODUODENOSCOPY (EGD) WITH PROPOFOL N/A 08/07/2017   Procedure: ESOPHAGOGASTRODUODENOSCOPY (EGD) WITH PROPOFOL;  Surgeon: Ladene Artist, MD;  Location: [facility];  Service: Endoscopy;  Laterality: N/A;   FOOT SURGERY Right    JOINT REPLACEMENT     LITHOTRIPSY     LUMBAR LAMINECTOMY/DECOMPRESSION MICRODISCECTOMY N/A 09/08/2015   Procedure: CENTRAL DECOMPRESSIVE LUMBAR LAMINECTOMIES L3-4, L4-5 AND BILATERAL HEMILAMINECTOMY L5-S1;  Surgeon: Jessy Oto, MD;  Location: [facility];  Service: Orthopedics;  Laterality: N/A;   nodule removed from left elbow     RIGHT/LEFT HEART CATH AND CORONARY/GRAFT ANGIOGRAPHY N/A 10/06/2020   Procedure: RIGHT/LEFT HEART CATH AND CORONARY/GRAFT ANGIOGRAPHY;  Surgeon: Martinique, Peter M, MD;  Location: [facility];  Service: Cardiovascular;  Laterality: N/A;   SHOULDER ARTHROSCOPY     TOTAL KNEE ARTHROPLASTY Bilateral    TOTAL SHOULDER  ARTHROPLASTY     TRIPLE BYPASS  04/27/05   US ECHOCARDIOGRAPHY  01/06/2009   EF 55-60%   WRIST SURGERY Left     Prior to Admission medications   Medication Sig Start Date End Date Taking? Authorizing Provider  acetaminophen (TYLENOL) 500 MG tablet Take 1,000 mg by mouth in the morning.   Yes [provider]  albuterol (VENTOLIN HFA) 108 (90 Base) MCG/ACT inhaler Inhale 2 puffs into the lungs every 4 (four) hours as needed for wheezing or shortness of breath (cough, shortness of breath or wheezing.). 03/07/19  Yes Ghimire, Henreitta Leber, MD  amLODipine (NORVASC) 5 MG tablet Take 5 mg by mouth in the morning.   Yes [provider]  aspirin EC 81 MG tablet Take 81 mg by mouth at bedtime. Swallow whole.   Yes [provider]  b  complex vitamins capsule Take 1 capsule by mouth daily.   Yes [provider]  baclofen (LIORESAL) 10 MG tablet TAKE 1 TABLET BY MOUTH TWICE A DAY AS NEEDED FOR SPASMS Patient taking differently: Take 10 mg by mouth 2 (two) times daily. 03/21/17  Yes Jessy Oto, MD  Bempedoic Acid (NEXLETOL) 180 MG TABS Take 180 mg by mouth daily. Patient taking differently: Take 180 mg by mouth every other day. In the evening 11/19/20  Yes Martinique, Peter M, MD  buPROPion (WELLBUTRIN XL) 150 MG 24 hr tablet Take 150 mg by mouth in the morning. 08/28/16  Yes [provider]  busPIRone (BUSPAR) 7.5 MG tablet Take 7.5 mg by mouth 2 (two) times daily. 07/22/16  Yes [provider]  Calcium Carb-Cholecalciferol (CALCIUM + D3 PO) Take 1 tablet by mouth daily.   Yes [provider]  cholecalciferol (VITAMIN D3) 25 MCG (1000 UT) tablet Take 1,000 Units by mouth at bedtime.   Yes [provider]  clobetasol cream (TEMOVATE) 6.06 % Apply 1 application topically daily.   Yes [provider]  Cyanocobalamin (VITAMIN B-12 PO) Take 1 capsule by mouth daily.   Yes [provider]  dicyclomine (BENTYL) 10 MG capsule Take 1 capsule (10 mg total) by mouth 3 (three) times daily before meals. Patient taking differently: Take 10 mg by mouth 2 (two) times daily as needed (IBS). 04/01/21  Yes Ladene Artist, MD  DULoxetine (CYMBALTA) 60 MG capsule Take 60 mg by mouth in the morning. 11/19/18  Yes [provider]  Echinacea 400 MG CAPS Take 400 mg by mouth daily.   Yes [provider]  ezetimibe (ZETIA) 10 MG tablet Take 1 tablet (10 mg total) by mouth daily. 03/08/19  Yes Ghimire, Henreitta Leber, MD  folic acid (FOLVITE) 1 MG tablet Take 2 tablets (2 mg total) by mouth daily. Patient taking differently: Take 1 mg by mouth in the morning and at bedtime. 04/25/18 05/19/25 Yes Deveshwar, Abel Presto, MD  gabapentin (NEURONTIN) 600 MG tablet Take 600 mg by mouth 3 (three)  times daily.  08/26/13  Yes [provider]  galantamine (RAZADYNE ER) 16 MG 24 hr capsule Take 16 mg by mouth every morning. 05/08/21  Yes [provider]  HYDROcodone-acetaminophen (NORCO/VICODIN) 5-325 MG tablet Take 1 tablet by mouth daily as needed (Arthritis). 03/31/20  Yes [provider]  hydroxychloroquine (PLAQUENIL) 200 MG tablet TAKE 1 TABLET TWICE DAILY FOR RHEUMATOID ARTHRITIS 06/23/21  Yes Ofilia Neas, PA-C  loperamide (IMODIUM A-D) 2 MG tablet Take 2 mg by mouth 4 (four) times daily as needed for diarrhea or loose stools.  Yes [provider]  meloxicam (MOBIC) 15 MG tablet Take 15 mg by mouth 2 (two) times a week. Wednesdays & Sundays   Yes [provider]  montelukast (SINGULAIR) 10 MG tablet Take 10 mg by mouth at bedtime.   Yes [provider]  Multiple Vitamins-Minerals (CENTRUM SILVER PO) Take 1 tablet by mouth daily.   Yes [provider]  Multiple Vitamins-Minerals (HAIR SKIN NAILS PO) Take 1 tablet by mouth daily.   Yes [provider]  Multiple Vitamins-Minerals (ZINC PO) Take 1 tablet by mouth daily.   Yes [provider]  mupirocin ointment (BACTROBAN) 2 % Apply 1 application topically daily. 05/08/19  Yes [provider]  Nebivolol HCl 20 MG TABS Take 20 mg by mouth at bedtime. 04/18/21  Yes [provider]  nitroGLYCERIN (NITROSTAT) 0.4 MG SL tablet nitroglycerin 0.4 mg sublingual tablet Patient taking differently: Place 0.4 mg under the tongue every 5 (five) minutes as needed for chest pain. 10/02/20  Yes Martinique, Peter M, MD  olmesartan-hydrochlorothiazide (BENICAR HCT) 40-25 MG tablet Take 1 tablet by mouth in the morning. 02/28/18  Yes [provider]  omeprazole (PRILOSEC) 20 MG capsule Take 20 mg by mouth in the morning.   Yes [provider]  OVER THE COUNTER MEDICATION Take 1 application by mouth daily as needed (wound care). Swedish bitters   Yes  [provider]  oxybutynin (DITROPAN-XL) 10 MG 24 hr tablet Take 10 mg by mouth daily. 11/11/20  Yes [provider]  OXYGEN Inhale 2 L into the lungs at bedtime. 01/29/15  Yes [provider]  Potassium 99 MG TABS Take 99 mg by mouth daily.   Yes [provider]  Probiotic Product (PROBIOTIC DAILY PO) Take 1 capsule by mouth daily.   Yes [provider]  psyllium (REGULOID) 0.52 g capsule Take 1.04 g by mouth 2 (two) times daily.   Yes [provider]  TART CHERRY PO Take 480 mg by mouth 2 (two) times daily.   Yes [provider]  ACCU-CHEK GUIDE test strip  07/05/19   [provider]  blood glucose meter kit and supplies Dispense based on patient and insurance preference. Use up to four times daily as directed. (FOR ICD-10 E10.9, E11.9). 03/07/19   Ghimire, Henreitta Leber, MD    Current Facility-Administered Medications  Medication Dose Route Frequency Provider Last Rate Last Admin   lactated ringers infusion    Continuous PRN Ladene Artist, MD 125 mL/hr at 06/24/21 1045 1,000 mL at 06/24/21 1045    Allergies as of 05/24/2021 - Review Complete 05/19/2021  Allergen Reaction Noted   Statins Other (See Comments) 09/03/2015   Alirocumab Other (See Comments) 08/18/2020   Aricept [donepezil]  08/18/2020   Atorvastatin  08/18/2020   Crestor [rosuvastatin]  08/18/2020   Diazepam  09/30/2019   Dry skin treatment [albolene]  08/18/2020   Evolocumab  08/18/2020   Ezetimibe  08/18/2020   Fenofibrate micronized  08/18/2020   Fentanyl  09/30/2019   Loratadine  08/18/2020   Metformin  08/18/2020   Morphine and related Other (See Comments) 02/24/2019   Niaspan [niacin]  08/18/2020   Phentermine-topiramate  08/18/2020   Welchol [colesevelam]  08/18/2020   Zocor [simvastatin]  08/18/2020   Azithromycin Rash 08/18/2020    Family History  Problem Relation Age of Onset   Heart disease Mother    Hyperlipidemia Mother    Lung  cancer Father    Diabetes Father    Hyperlipidemia  Father    Heart disease Sister    Hypertension Sister    Hyperlipidemia Sister    Heart disease Brother    Diabetes Brother    Hypertension Brother    Hyperlipidemia Brother    Lung cancer Brother    Heart disease Brother    Hypertension Brother    Hyperlipidemia Brother    Heart disease Brother    Hypertension Brother    Hyperlipidemia Brother    Heart disease Maternal Aunt    Asthma Maternal Uncle    Heart disease Maternal Uncle     Social History   Socioeconomic History   Marital status: Widowed    Spouse name: Not on file   Number of children: 2   Years of education: Not on file   Highest education level: Not on file  Occupational History   Occupation: retired    Fish farm manager: UNEMPLOYED  Tobacco Use   Smoking status: Never   Smokeless tobacco: Never  Vaping Use   Vaping Use: Never used  Substance and Sexual Activity   Alcohol use: No    Alcohol/week: 0.0 standard drinks   Drug use: Never   Sexual activity: Not on file  Other Topics Concern   Not on file  Social History Narrative   Not on file   Social Determinants of Health   Financial Resource Strain: Not on file  Food Insecurity: Not on file  Transportation Needs: Not on file  Physical Activity: Not on file  Stress: Not on file  Social Connections: Not on file  Intimate Partner Violence: Not on file    Review of Systems:  All systems reviewed an negative except where noted in HPI.  Gen: Denies any fever, chills, sweats, anorexia, fatigue, weakness, malaise, weight loss, and sleep disorder CV: Denies chest pain, angina, palpitations, syncope, orthopnea, PND, peripheral edema, and claudication. Resp: Denies dyspnea at rest, dyspnea with exercise, cough, sputum, wheezing, coughing up blood, and pleurisy. GI: Denies vomiting blood, jaundice, and fecal incontinence.   Denies dysphagia or odynophagia. GU : Denies urinary burning, blood in urine, urinary  frequency, urinary hesitancy, nocturnal urination, and urinary incontinence. MS: Denies joint pain, limitation of movement, and swelling, stiffness, low back pain, extremity pain. Denies muscle weakness, cramps, atrophy.  Derm: Denies rash, itching, dry skin, hives, moles, warts, or unhealing ulcers.  Psych: Denies depression, anxiety, memory loss, suicidal ideation, hallucinations, paranoia, and confusion. Heme: Denies bruising, bleeding, and enlarged lymph nodes. Neuro:  Denies any headaches, dizziness, paresthesias. Endo:  Denies any problems with DM, thyroid, adrenal function.   Physical Exam: Vital signs in last 24 hours: Temp:  [98.5 F (36.9 C)] 98.5 F (36.9 C) (12/01 1025) Pulse Rate:  [61] 61 (12/01 1025) Resp:  [16] 16 (12/01 1025) BP: (170)/(60) 170/60 (12/01 1025) SpO2:  [98 %] 98 % (12/01 1025) Weight:  [104.8 kg] 104.8 kg (12/01 1025)   General:  Alert, well-developed, in NAD Head:  Normocephalic and atraumatic. Eyes:  Sclera clear, no icterus.   Conjunctiva pink. Ears:  Normal auditory acuity. Mouth:  No deformity or lesions.  Neck:  Supple; no masses . Lungs:  Clear throughout to auscultation.   No wheezes, crackles, or rhonchi. No acute distress. Heart:  Regular rate and rhythm; no murmurs. Abdomen:  Soft, nondistended, nontender. No masses, hepatomegaly. No obvious masses.  Normal bowel .    Rectal:  Deferred   Msk:  Symmetrical without gross deformities.. Pulses:  Normal pulses noted. Extremities:  Without edema. Neurologic:  Alert and  oriented x4;  grossly normal neurologically. Skin:  Intact without significant lesions or rashes. Cervical Nodes:  No significant cervical adenopathy. Psych:  Alert and cooperative. Normal mood and affect.   Impression / Plan:   FIT positive, diarrhea, fecal urgency. history of colon polyps for colonoscopy.    Pricilla Riffle. Fuller Plan  06/24/2021, 11:24 AM See Shea Evans, [facility], to contact our on call provider

## 2021-06-24 NOTE — Anesthesia Procedure Notes (Signed)
Procedure Name: MAC Date/Time: 06/24/2021 11:39 AM Performed by: Eben Burow, CRNA Pre-anesthesia Checklist: Patient identified, Emergency Drugs available, Suction available, Patient being monitored and Timeout performed Oxygen Delivery Method: Simple face mask Placement Confirmation: positive ETCO2

## 2021-06-24 NOTE — Op Note (Signed)
[facility] Patient Name: [name] Procedure Date: 06/24/2021 MRN: [id number] Attending MD: Ladene Artist , MD Date of Birth: 1946/01/10 CSN: [id number] Age: 75 Admit Type: Outpatient Procedure:                Colonoscopy Indications:              Clinically significant diarrhea of unexplained                            origin, Personal history of adenomatous colon                            polyps. Providers:                Pricilla Riffle. Fuller Plan, MD, Particia Nearing, RN, Luan Moore, Technician, Maudry Diego, CRNA Referring MD:             Jeannette How. Perini, MD Medicines:                Monitored Anesthesia Care Complications:            No immediate complications. Estimated blood loss:                            None. Estimated Blood Loss:     Estimated blood loss: none. Procedure:                Pre-Anesthesia Assessment:                           - Prior to the procedure, a History and Physical                            was performed, and patient medications and                            allergies were reviewed. The patient's tolerance of                            previous anesthesia was also reviewed. The risks                            and benefits of the procedure and the sedation                            options and risks were discussed with the patient.                            All questions were answered, and informed consent                            was obtained. Prior Anticoagulants: The patient has                            taken no previous  anticoagulant or antiplatelet                            agents. ASA Grade Assessment: III - A patient with                            severe systemic disease. After reviewing the risks                            and benefits, the patient was deemed in                            satisfactory condition to undergo the procedure.                           After obtaining informed  consent, the colonoscope                            was passed under direct vision. Throughout the                            procedure, the patient's blood pressure, pulse, and                            oxygen saturations were monitored continuously. The                            [id number]-HQ190L ([id number]) Olympus colonoscope was                            introduced through the anus and advanced to the the                            cecum, identified by appendiceal orifice and                            ileocecal valve. The ileocecal valve, appendiceal                            orifice, and rectum were photographed. The quality                            of the bowel preparation was adequate. The                            colonoscopy was somewhat difficult due to decreased                            endoscopic visualization, significant looping, a                            tortuous colon and colonoscope suction failure  while in the cecum requiring replacing it with a                            second colonoscope. The patient tolerated the                            procedure well. Scope In: 11:48:36 AM Scope Out: 12:27:55 PM Scope Withdrawal Time: 0 hours 35 minutes 21 seconds  Total Procedure Duration: 0 hours 39 minutes 19 seconds  Findings:      The perianal and digital rectal examinations were normal.      Two sessile polyps were found in the cecum. The polyps were 8 to 10 mm       in size. These polyps were removed with a cold snare. Resection and       retrieval were complete.      A 12 mm polyp was found in the ascending colon. The polyp was sessile.       The polyp was removed with a cold snare. Resection and retrieval were       complete.      Multiple small-mouthed diverticula were found in the left colon. There       was narrowing of the colon in association with the diverticular opening.       There was evidence of diverticular spasm. There was no  evidence of       diverticular bleeding.      Internal hemorrhoids were found during retroflexion. The hemorrhoids       were Grade I (internal hemorrhoids that do not prolapse).      The exam was otherwise without abnormality on direct and retroflexion       views. Random biopsies throughout. Impression:               - Two 8 to 10 mm polyps in the cecum, removed with                            a cold snare. Resected and retrieved.                           - One 12 mm polyp in the ascending colon, removed                            with a cold snare. Resected and retrieved.                           - Moderate diverticulosis in the left colon.                           - Internal hemorrhoids.                           - The examination was otherwise normal on direct                            and retroflexion views. Random biopsies. Moderate Sedation:      Not Applicable - Patient had care per Anesthesia. Recommendation:           -  Repeat colonoscopy vs no repeat due to age and                            comorbidities after studies are complete for                            surveillance based on pathology results.                           - Patient has a contact number available for                            emergencies. The signs and symptoms of potential                            delayed complications were discussed with the                            patient. Return to normal activities tomorrow.                            Written discharge instructions were provided to the                            patient.                           - Resume previous diet.                           - Continue present medications.                           - Await pathology results.                           - No aspirin, ibuprofen, naproxen, or other                            non-steroidal anti-inflammatory drugs for 2 weeks                            after polyp removal. EC ASA 81 mg is ok  to continue. Procedure Code(s):        --- Professional ---                           [phone], Colonoscopy, flexible; with removal of                            tumor(s), polyp(s), or other lesion(s) by snare                            technique Diagnosis Code(s):        --- Professional ---  K63.5, Polyp of colon                           K64.0, First degree hemorrhoids                           R19.7, Diarrhea, unspecified                           K57.30, Diverticulosis of large intestine without                            perforation or abscess without bleeding CPT copyright 2019 [employer]. All rights reserved. The codes documented in this report are preliminary and upon coder review may  be revised to meet current compliance requirements. Ladene Artist, MD 06/24/2021 12:42:39 PM This report has been signed electronically. Number of Addenda: 0

## 2021-06-25 ENCOUNTER — Encounter: Payer: Self-pay | Admitting: Gastroenterology

## 2021-06-25 LAB — SURGICAL PATHOLOGY

## 2021-06-26 NOTE — Anesthesia Postprocedure Evaluation (Signed)
Anesthesia Post Note  Patient: Bertice Risse Sakata  Procedure(s) Performed: COLONOSCOPY WITH PROPOFOL POLYPECTOMY BIOPSY     Patient location during evaluation: Endoscopy Anesthesia Type: MAC Level of consciousness: awake and alert Pain management: pain level controlled Vital Signs Assessment: post-procedure vital signs reviewed and stable Respiratory status: spontaneous breathing, nonlabored ventilation, respiratory function stable and patient connected to nasal cannula oxygen Cardiovascular status: stable and blood pressure returned to baseline Postop Assessment: no apparent nausea or vomiting Anesthetic complications: no   No notable events documented.  Last Vitals:  Vitals:   06/24/21 1250 06/24/21 1300  BP: (!) 145/84 (!) 185/70  Pulse: (!) 56 (!) 53  Resp:    Temp:    SpO2: 98% 100%    Last Pain:  Vitals:   06/24/21 1300  TempSrc:   PainSc: 0-No pain                 Sunjai Levandoski

## 2021-06-27 ENCOUNTER — Encounter (HOSPITAL_COMMUNITY): Payer: Self-pay | Admitting: Gastroenterology

## 2021-07-08 ENCOUNTER — Encounter: Payer: Self-pay | Admitting: Physician Assistant

## 2021-07-08 ENCOUNTER — Other Ambulatory Visit: Payer: Self-pay

## 2021-07-08 ENCOUNTER — Ambulatory Visit: Payer: Medicare HMO | Admitting: Physician Assistant

## 2021-07-08 VITALS — BP 146/81 | HR 53 | Ht 64.0 in | Wt 233.0 lb

## 2021-07-08 DIAGNOSIS — Z8639 Personal history of other endocrine, nutritional and metabolic disease: Secondary | ICD-10-CM

## 2021-07-08 DIAGNOSIS — M0609 Rheumatoid arthritis without rheumatoid factor, multiple sites: Secondary | ICD-10-CM

## 2021-07-08 DIAGNOSIS — Z79899 Other long term (current) drug therapy: Secondary | ICD-10-CM

## 2021-07-08 DIAGNOSIS — Z853 Personal history of malignant neoplasm of breast: Secondary | ICD-10-CM

## 2021-07-08 DIAGNOSIS — M5030 Other cervical disc degeneration, unspecified cervical region: Secondary | ICD-10-CM

## 2021-07-08 DIAGNOSIS — R600 Localized edema: Secondary | ICD-10-CM

## 2021-07-08 DIAGNOSIS — M5136 Other intervertebral disc degeneration, lumbar region: Secondary | ICD-10-CM

## 2021-07-08 DIAGNOSIS — Z8669 Personal history of other diseases of the nervous system and sense organs: Secondary | ICD-10-CM

## 2021-07-08 DIAGNOSIS — L97921 Non-pressure chronic ulcer of unspecified part of left lower leg limited to breakdown of skin: Secondary | ICD-10-CM | POA: Diagnosis not present

## 2021-07-08 DIAGNOSIS — Z96653 Presence of artificial knee joint, bilateral: Secondary | ICD-10-CM | POA: Diagnosis not present

## 2021-07-08 DIAGNOSIS — I83893 Varicose veins of bilateral lower extremities with other complications: Secondary | ICD-10-CM

## 2021-07-08 DIAGNOSIS — Z8679 Personal history of other diseases of the circulatory system: Secondary | ICD-10-CM | POA: Diagnosis not present

## 2021-07-08 NOTE — Patient Instructions (Signed)
Hand Exercises Hand exercises can be helpful for almost anyone. These exercises can strengthen the hands, improve flexibility and movement, and increase blood flow to the hands. These results can make work and daily tasks easier. Hand exercises can be especially helpful for people who have joint pain from arthritis or have nerve damage from overuse (carpal tunnel syndrome). These exercises can also help people who have injured a hand. Exercises Most of these hand exercises are gentle stretching and motion exercises. It is usually safe to do them often throughout the day. Warming up your hands before exercise may help to reduce stiffness. You can do this with gentle massage or by placing your hands in warm water for 10-15 minutes. It is normal to feel some stretching, pulling, tightness, or mild discomfort as you begin new exercises. This will gradually improve. Stop an exercise right away if you feel sudden, severe pain or your pain gets worse. Ask your health care provider which exercises are best for you. Knuckle bend or "claw" fist  Stand or sit with your arm, hand, and all five fingers pointed straight up. Make sure to keep your wrist straight during the exercise. Gently bend your fingers down toward your palm until the tips of your fingers are touching the top of your palm. Keep your big knuckle straight and just bend the small knuckles in your fingers. Hold this position for __________ seconds. Straighten (extend) your fingers back to the starting position. Repeat this exercise 5-10 times with each hand. Full finger fist  Stand or sit with your arm, hand, and all five fingers pointed straight up. Make sure to keep your wrist straight during the exercise. Gently bend your fingers into your palm until the tips of your fingers are touching the middle of your palm. Hold this position for __________ seconds. Extend your fingers back to the starting position, stretching every joint fully. Repeat  this exercise 5-10 times with each hand. Straight fist Stand or sit with your arm, hand, and all five fingers pointed straight up. Make sure to keep your wrist straight during the exercise. Gently bend your fingers at the big knuckle, where your fingers meet your hand, and the middle knuckle. Keep the knuckle at the tips of your fingers straight and try to touch the bottom of your palm. Hold this position for __________ seconds. Extend your fingers back to the starting position, stretching every joint fully. Repeat this exercise 5-10 times with each hand. Tabletop  Stand or sit with your arm, hand, and all five fingers pointed straight up. Make sure to keep your wrist straight during the exercise. Gently bend your fingers at the big knuckle, where your fingers meet your hand, as far down as you can while keeping the small knuckles in your fingers straight. Think of forming a tabletop with your fingers. Hold this position for __________ seconds. Extend your fingers back to the starting position, stretching every joint fully. Repeat this exercise 5-10 times with each hand. Finger spread  Place your hand flat on a table with your palm facing down. Make sure your wrist stays straight as you do this exercise. Spread your fingers and thumb apart from each other as far as you can until you feel a gentle stretch. Hold this position for __________ seconds. Bring your fingers and thumb tight together again. Hold this position for __________ seconds. Repeat this exercise 5-10 times with each hand. Making circles  Stand or sit with your arm, hand, and all five fingers pointed   straight up. Make sure to keep your wrist straight during the exercise. Make a circle by touching the tip of your thumb to the tip of your index finger. Hold for __________ seconds. Then open your hand wide. Repeat this motion with your thumb and each finger on your hand. Repeat this exercise 5-10 times with each hand. Thumb  motion  Sit with your forearm resting on a table and your wrist straight. Your thumb should be facing up toward the ceiling. Keep your fingers relaxed as you move your thumb. Lift your thumb up as high as you can toward the ceiling. Hold for __________ seconds. Bend your thumb across your palm as far as you can, reaching the tip of your thumb for the small finger (pinkie) side of your palm. Hold for __________ seconds. Repeat this exercise 5-10 times with each hand. Grip strengthening  Hold a stress ball or other soft ball in the middle of your hand. Slowly increase the pressure, squeezing the ball as much as you can without causing pain. Think of bringing the tips of your fingers into the middle of your palm. All of your finger joints should bend when doing this exercise. Hold your squeeze for __________ seconds, then relax. Repeat this exercise 5-10 times with each hand. Contact a health care provider if: Your hand pain or discomfort gets much worse when you do an exercise. Your hand pain or discomfort does not improve within 2 hours after you exercise. If you have any of these problems, stop doing these exercises right away. Do not do them again unless your health care provider says that you can. Get help right away if: You develop sudden, severe hand pain or swelling. If this happens, stop doing these exercises right away. Do not do them again unless your health care provider says that you can. This information is not intended to replace advice given to you by your health care provider. Make sure you discuss any questions you have with your health care provider. Document Revised: 10/29/2020 Document Reviewed: 10/29/2020 Elsevier Patient Education  2022 [employer]  

## 2021-07-09 LAB — COMPLETE METABOLIC PANEL WITH GFR
AG Ratio: 2 (calc) (ref 1.0–2.5)
ALT: 19 U/L (ref 6–29)
AST: 23 U/L (ref 10–35)
Albumin: 4.4 g/dL (ref 3.6–5.1)
Alkaline phosphatase (APISO): 36 U/L — ABNORMAL LOW (ref 37–153)
BUN/Creatinine Ratio: 29 (calc) — ABNORMAL HIGH (ref 6–22)
BUN: 16 mg/dL (ref 7–25)
CO2: 29 mmol/L (ref 20–32)
Calcium: 9.4 mg/dL (ref 8.6–10.4)
Chloride: 103 mmol/L (ref 98–110)
Creat: 0.55 mg/dL — ABNORMAL LOW (ref 0.60–1.00)
Globulin: 2.2 g/dL (calc) (ref 1.9–3.7)
Glucose, Bld: 147 mg/dL — ABNORMAL HIGH (ref 65–99)
Potassium: 4.2 mmol/L (ref 3.5–5.3)
Sodium: 141 mmol/L (ref 135–146)
Total Bilirubin: 0.5 mg/dL (ref 0.2–1.2)
Total Protein: 6.6 g/dL (ref 6.1–8.1)
eGFR: 95 mL/min/{1.73_m2} (ref >=60)

## 2021-07-09 LAB — CBC WITH DIFFERENTIAL/PLATELET
Absolute Monocytes: 578 cells/uL (ref 200–950)
Basophils Absolute: 27 cells/uL (ref 0–200)
Basophils Relative: 0.4 %
Eosinophils Absolute: 82 cells/uL (ref 15–500)
Eosinophils Relative: 1.2 %
HCT: 40.5 % (ref 35.0–45.0)
Hemoglobin: 13.3 g/dL (ref 11.7–15.5)
Lymphs Abs: 2094 cells/uL (ref 850–3900)
MCH: 30.2 pg (ref 27.0–33.0)
MCHC: 32.8 g/dL (ref 32.0–36.0)
MCV: 92 fL (ref 80.0–100.0)
MPV: 11.5 fL (ref 7.5–12.5)
Monocytes Relative: 8.5 %
Neutro Abs: 4019 cells/uL (ref 1500–7800)
Neutrophils Relative %: 59.1 %
Platelets: 178 10*3/uL (ref 140–400)
RBC: 4.4 10*6/uL (ref 3.80–5.10)
RDW: 12.8 % (ref 11.0–15.0)
Total Lymphocyte: 30.8 %
WBC: 6.8 10*3/uL (ref 3.8–10.8)

## 2021-07-09 NOTE — Progress Notes (Signed)
CBC WNL. Glucose is 147.  Creatinine is borderline elevated.  ALK phos is borderline low. Rest of CMP WNL.  We will continue to monitor.

## 2021-07-14 DIAGNOSIS — M47812 Spondylosis without myelopathy or radiculopathy, cervical region: Secondary | ICD-10-CM | POA: Diagnosis not present

## 2021-07-14 DIAGNOSIS — M069 Rheumatoid arthritis, unspecified: Secondary | ICD-10-CM | POA: Diagnosis not present

## 2021-07-14 DIAGNOSIS — Z79899 Other long term (current) drug therapy: Secondary | ICD-10-CM | POA: Diagnosis not present

## 2021-07-14 DIAGNOSIS — M47816 Spondylosis without myelopathy or radiculopathy, lumbar region: Secondary | ICD-10-CM | POA: Diagnosis not present

## 2021-07-14 DIAGNOSIS — M5030 Other cervical disc degeneration, unspecified cervical region: Secondary | ICD-10-CM | POA: Diagnosis not present

## 2021-07-29 DIAGNOSIS — Z6841 Body Mass Index (BMI) 40.0 and over, adult: Secondary | ICD-10-CM | POA: Diagnosis not present

## 2021-07-29 DIAGNOSIS — K580 Irritable bowel syndrome with diarrhea: Secondary | ICD-10-CM | POA: Diagnosis not present

## 2021-07-29 DIAGNOSIS — Z79899 Other long term (current) drug therapy: Secondary | ICD-10-CM | POA: Diagnosis not present

## 2021-07-29 DIAGNOSIS — E119 Type 2 diabetes mellitus without complications: Secondary | ICD-10-CM | POA: Diagnosis not present

## 2021-07-29 DIAGNOSIS — N3281 Overactive bladder: Secondary | ICD-10-CM | POA: Diagnosis not present

## 2021-07-29 DIAGNOSIS — H35372 Puckering of macula, left eye: Secondary | ICD-10-CM | POA: Diagnosis not present

## 2021-07-29 DIAGNOSIS — N812 Incomplete uterovaginal prolapse: Secondary | ICD-10-CM | POA: Diagnosis not present

## 2021-07-29 DIAGNOSIS — H04123 Dry eye syndrome of bilateral lacrimal glands: Secondary | ICD-10-CM | POA: Diagnosis not present

## 2021-08-10 DIAGNOSIS — J101 Influenza due to other identified influenza virus with other respiratory manifestations: Secondary | ICD-10-CM | POA: Diagnosis not present

## 2021-08-10 DIAGNOSIS — G4733 Obstructive sleep apnea (adult) (pediatric): Secondary | ICD-10-CM | POA: Diagnosis not present

## 2021-08-10 DIAGNOSIS — J45909 Unspecified asthma, uncomplicated: Secondary | ICD-10-CM | POA: Diagnosis not present

## 2021-08-10 DIAGNOSIS — R0981 Nasal congestion: Secondary | ICD-10-CM | POA: Diagnosis not present

## 2021-08-10 DIAGNOSIS — M069 Rheumatoid arthritis, unspecified: Secondary | ICD-10-CM | POA: Diagnosis not present

## 2021-08-10 DIAGNOSIS — Z9981 Dependence on supplemental oxygen: Secondary | ICD-10-CM | POA: Diagnosis not present

## 2021-08-10 DIAGNOSIS — E1151 Type 2 diabetes mellitus with diabetic peripheral angiopathy without gangrene: Secondary | ICD-10-CM | POA: Diagnosis not present

## 2021-08-10 DIAGNOSIS — J441 Chronic obstructive pulmonary disease with (acute) exacerbation: Secondary | ICD-10-CM | POA: Diagnosis not present

## 2021-08-10 DIAGNOSIS — Z8616 Personal history of COVID-19: Secondary | ICD-10-CM | POA: Diagnosis not present

## 2021-08-13 DIAGNOSIS — E1140 Type 2 diabetes mellitus with diabetic neuropathy, unspecified: Secondary | ICD-10-CM | POA: Diagnosis not present

## 2021-08-13 DIAGNOSIS — M47812 Spondylosis without myelopathy or radiculopathy, cervical region: Secondary | ICD-10-CM | POA: Diagnosis not present

## 2021-08-13 DIAGNOSIS — M47816 Spondylosis without myelopathy or radiculopathy, lumbar region: Secondary | ICD-10-CM | POA: Diagnosis not present

## 2021-08-13 DIAGNOSIS — Z6841 Body Mass Index (BMI) 40.0 and over, adult: Secondary | ICD-10-CM | POA: Diagnosis not present

## 2021-08-13 DIAGNOSIS — M069 Rheumatoid arthritis, unspecified: Secondary | ICD-10-CM | POA: Diagnosis not present

## 2021-08-13 DIAGNOSIS — Z0000 Encounter for general adult medical examination without abnormal findings: Secondary | ICD-10-CM | POA: Diagnosis not present

## 2021-08-13 DIAGNOSIS — Z79899 Other long term (current) drug therapy: Secondary | ICD-10-CM | POA: Diagnosis not present

## 2021-08-23 DIAGNOSIS — Z853 Personal history of malignant neoplasm of breast: Secondary | ICD-10-CM | POA: Insufficient documentation

## 2021-08-24 DIAGNOSIS — R87610 Atypical squamous cells of undetermined significance on cytologic smear of cervix (ASC-US): Secondary | ICD-10-CM | POA: Diagnosis not present

## 2021-08-24 DIAGNOSIS — Z6841 Body Mass Index (BMI) 40.0 and over, adult: Secondary | ICD-10-CM | POA: Diagnosis not present

## 2021-08-24 DIAGNOSIS — Z124 Encounter for screening for malignant neoplasm of cervix: Secondary | ICD-10-CM | POA: Diagnosis not present

## 2021-08-25 DIAGNOSIS — M069 Rheumatoid arthritis, unspecified: Secondary | ICD-10-CM | POA: Diagnosis not present

## 2021-08-25 DIAGNOSIS — E1151 Type 2 diabetes mellitus with diabetic peripheral angiopathy without gangrene: Secondary | ICD-10-CM | POA: Diagnosis not present

## 2021-08-25 DIAGNOSIS — D8989 Other specified disorders involving the immune mechanism, not elsewhere classified: Secondary | ICD-10-CM | POA: Diagnosis not present

## 2021-08-25 DIAGNOSIS — J45909 Unspecified asthma, uncomplicated: Secondary | ICD-10-CM | POA: Diagnosis not present

## 2021-08-25 DIAGNOSIS — G4733 Obstructive sleep apnea (adult) (pediatric): Secondary | ICD-10-CM | POA: Diagnosis not present

## 2021-08-25 DIAGNOSIS — J441 Chronic obstructive pulmonary disease with (acute) exacerbation: Secondary | ICD-10-CM | POA: Diagnosis not present

## 2021-08-25 DIAGNOSIS — Z23 Encounter for immunization: Secondary | ICD-10-CM | POA: Diagnosis not present

## 2021-08-25 DIAGNOSIS — J302 Other seasonal allergic rhinitis: Secondary | ICD-10-CM | POA: Diagnosis not present

## 2021-08-25 DIAGNOSIS — Z9981 Dependence on supplemental oxygen: Secondary | ICD-10-CM | POA: Diagnosis not present

## 2021-08-25 DIAGNOSIS — M1990 Unspecified osteoarthritis, unspecified site: Secondary | ICD-10-CM | POA: Diagnosis not present

## 2021-08-29 NOTE — Progress Notes (Signed)
° °[name] °Date of Birth: 10/01/1945 ° ° °History of Present Illness: °[name] is seen for follow up CAD.  She is s/p CABG in 2005 with LIMA to LAD and left radial graft to the first diagonal. Myoview in March 2018 was normal.  Echo in 2015 unremarkable. She was seen by Dr. Alva and diagnosed with OSA by at home testing. She is currently using nocturnal oxygen. In 2018 she underwent lumbar laminectomy complicated by a dural tear that was repaired. Since that time she has continued to have back pain that limits her activity. When seen in 2019 complained of dyspnea. Echo showed moderate LVH but was otherwise normal. She had followup with pulmonary. PFTs showed moderate restriction.  ° °She was admitted in August 2020 with Covid 19 PNA. Managed with oxygen therapy and steroids, Remdesivir, and Actemsa. She reports since she had Covid she is still on oxygen at night. States she doesn't know if she'll ever get over Covid. Pulse ox.drops down to 87 at times.  ° °When seen last year she complained of sweats with activity and also complains that her chest feels heavy. Worse with activity. To evaluate further she underwent cardiac cath showing patent LIMA to LAD and radial graft to OM. SVG to diagonal was occluded. Disease in distal RCA branches and diagonal not amenable to PCI. Based on findings it was felt symptoms were likely more pulmonary related or deconditioning.  ° °After her last visit she was seen by Pharm D for her hyperlipidemia. Trial of Nexletol recommended. She is taking this every other day. Patient not interested in Leqvio. She is followed by Rheumatology for RA on Plaquenil. She did have the flu a couple of weeks.  ° ° °Current Outpatient Medications on File Prior to Visit  °Medication Sig Dispense Refill  ° ACCU-CHEK GUIDE test strip     ° acetaminophen (TYLENOL) 500 MG tablet Take 1,000 mg by mouth at bedtime.    ° albuterol (VENTOLIN HFA) 108 (90 Base) MCG/ACT inhaler Inhale 2 puffs into the  lungs every 4 (four) hours as needed for wheezing or shortness of breath (cough, shortness of breath or wheezing.). 6.7 g 0  ° amLODipine (NORVASC) 5 MG tablet Take 5 mg by mouth in the morning.    ° aspirin EC 81 MG tablet Take 81 mg by mouth at bedtime. Swallow whole.    ° b complex vitamins capsule Take 1 capsule by mouth daily.    ° baclofen (LIORESAL) 10 MG tablet TAKE 1 TABLET BY MOUTH TWICE A DAY AS NEEDED FOR SPASMS (Patient taking differently: Take 10 mg by mouth 2 (two) times daily.) 180 tablet 0  ° Bempedoic Acid (NEXLETOL) 180 MG TABS Take 180 mg by mouth daily. (Patient taking differently: Take 180 mg by mouth every other day. In the evening) 30 tablet 11  ° blood glucose meter kit and supplies Dispense based on patient and insurance preference. Use up to four times daily as directed. (FOR ICD-10 E10.9, E11.9). 1 each 0  ° buPROPion (WELLBUTRIN XL) 150 MG 24 hr tablet Take 150 mg by mouth in the morning.    ° busPIRone (BUSPAR) 7.5 MG tablet Take 7.5 mg by mouth 2 (two) times daily.    ° Calcium Carb-Cholecalciferol (CALCIUM + D3 PO) Take 1 tablet by mouth daily.    ° cholecalciferol (VITAMIN D3) 25 MCG (1000 UT) tablet Take 1,000 Units by mouth at bedtime.    ° clobetasol cream (TEMOVATE) 0.05 % Apply 1 application   topically daily.    ° Cyanocobalamin (VITAMIN B-12 PO) Take 1 capsule by mouth daily.    ° dicyclomine (BENTYL) 10 MG capsule Take 1 capsule (10 mg total) by mouth 3 (three) times daily before meals. (Patient taking differently: Take 10 mg by mouth 2 (two) times daily as needed (IBS).) 90 capsule 11  ° DULoxetine (CYMBALTA) 60 MG capsule Take 60 mg by mouth in the morning.    ° Echinacea 400 MG CAPS Take 400 mg by mouth daily.    ° ezetimibe (ZETIA) 10 MG tablet Take 1 tablet (10 mg total) by mouth daily. 30 tablet 0  ° folic acid (FOLVITE) 1 MG tablet Take 2 tablets (2 mg total) by mouth daily. (Patient taking differently: Take 1 mg by mouth in the morning and at bedtime.) 180 tablet 4  °  gabapentin (NEURONTIN) 600 MG tablet Take 600 mg by mouth 3 (three) times daily.     ° galantamine (RAZADYNE ER) 16 MG 24 hr capsule Take 16 mg by mouth every morning.    ° HYDROcodone-acetaminophen (NORCO/VICODIN) 5-325 MG tablet Take 1 tablet by mouth daily as needed (Arthritis).    ° hydroxychloroquine (PLAQUENIL) 200 MG tablet TAKE 1 TABLET TWICE DAILY FOR RHEUMATOID ARTHRITIS 180 tablet 0  ° loperamide (IMODIUM A-D) 2 MG tablet Take 2 mg by mouth 4 (four) times daily as needed for diarrhea or loose stools.    ° meloxicam (MOBIC) 15 MG tablet Take 15 mg by mouth 2 (two) times a week. Wednesdays & Sundays    ° montelukast (SINGULAIR) 10 MG tablet Take 10 mg by mouth at bedtime.    ° Multiple Vitamins-Minerals (CENTRUM SILVER PO) Take 1 tablet by mouth daily.    ° Multiple Vitamins-Minerals (HAIR SKIN NAILS PO) Take 1 tablet by mouth daily.    ° Multiple Vitamins-Minerals (ZINC PO) Take 1 tablet by mouth daily.    ° mupirocin ointment (BACTROBAN) 2 % Apply 1 application topically daily.    ° Nebivolol HCl 20 MG TABS Take 20 mg by mouth at bedtime.    ° nitroGLYCERIN (NITROSTAT) 0.4 MG SL tablet nitroglycerin 0.4 mg sublingual tablet (Patient taking differently: Place 0.4 mg under the tongue every 5 (five) minutes as needed for chest pain.) 25 tablet 11  ° olmesartan-hydrochlorothiazide (BENICAR HCT) 40-25 MG tablet Take 1 tablet by mouth in the morning.  5  ° omeprazole (PRILOSEC) 20 MG capsule Take 20 mg by mouth in the morning.    ° OVER THE COUNTER MEDICATION Take 1 application by mouth daily as needed (wound care). Swedish bitters    ° oxybutynin (DITROPAN-XL) 10 MG 24 hr tablet Take 10 mg by mouth daily.    ° OXYGEN Inhale 2 L into the lungs at bedtime.    ° Potassium 99 MG TABS Take 99 mg by mouth daily.    ° Probiotic Product (PROBIOTIC DAILY PO) Take 1 capsule by mouth daily.    ° psyllium (REGULOID) 0.52 g capsule Take 1.04 g by mouth 2 (two) times daily.    ° TART CHERRY PO Take 480 mg by mouth 2 (two)  times daily.    ° °Current Facility-Administered Medications on File Prior to Visit  °Medication Dose Route Frequency Provider Last Rate Last Admin  ° sodium chloride flush (NS) 0.9 % injection 3 mL  3 mL Intravenous Q12H ,  M, MD      ° ° °Allergies  °Allergen Reactions  ° Statins Other (See Comments)  °  Joint pain   pain and swelling/burning   Alirocumab Other (See Comments)    (praluent) all sorts of side effects. strange.  she had burning, weakness, felt joints hurt more, felt asthma flared.   Aricept [Donepezil]     leg pains   Atorvastatin     Leg pain   Crestor [Rosuvastatin]     Other reaction(s): even 1/2 of 5 mg 2-3 days a week couldn't take due to muscle side effects.   Diazepam     "makes me crazy"   Dry Skin Treatment [Albolene]     Other reaction(s): contact dermatitis when in covid hospital   Evolocumab     (Repatha) felt like going to pass out.legs were weak.   Ezetimibe     leg cramps   Fenofibrate Micronized     cramps   Fentanyl Other (See Comments)    loopy   Loratadine     leg cramps   Metformin     upset stomach   Morphine And Related Other (See Comments)    hallucinations   Niaspan [Niacin]     burning   Phentermine-Topiramate     Other reaction(s): just didn't work for her.   Welchol [Colesevelam]     Leg cramps   Zocor [Simvastatin]     muscle aches   Azithromycin Rash    Legs burning     Past Medical History:  Diagnosis Date   Actinic keratosis    Anxiety    Asthma    Albuterol inhaler as needed.Pulmicort neb as needed   Breast cancer ([place])    right - lumpectomy    Chronic back pain    spinal stenosis   Coronary artery disease    COVID    uses o2 since at hs   Dependence on nocturnal oxygen therapy 07/10/2017   Pt uses 2 liters 02 at night    Depression    takes CYmbalta and Wellbutrin daily   Diabetes mellitus    not on any meds/controlled by diet   Dyspnea    with exertion occasionally when lies down   GERD (gastroesophageal  reflux disease)    takes Omeprazole daily   Headache    oocasionally   History of kidney stones    Hyperlipidemia    not on any meds   Hypertension    takes Benazepril,Bystolic,and Amlodipine daily   IBS (irritable bowel syndrome)    Joint pain    Joint swelling    Livedoid vasculitis    Nocturia    OA (osteoarthritis) of knee    Other seborrheic keratosis    Oxygen deficiency    2 liters at night per pt for OSA- no cpap    Peripheral edema    takes daily as needed   Peripheral neuropathy    Personal history of chemotherapy    Personal history of radiation therapy    Pneumonia    hx of-several yrs ago   RA (rheumatoid arthritis) ([place])    Urinary frequency    Urinary urgency    Weakness    numbness and tingling mainly in left leg occasionally in right.Tingling/numbness in hands    Past Surgical History:  Procedure Laterality Date   BIOPSY  06/24/2021   Procedure: BIOPSY;  Surgeon: Ladene Artist, MD;  Location: [facility];  Service: Endoscopy;;   BREAST LUMPECTOMY  1997   RIGHT BREAST   CARDIAC CATHETERIZATION  04/23/2004   EF 60%   cataract surgery Bilateral    COLONOSCOPY  COLONOSCOPY WITH PROPOFOL N/A 08/07/2017   Procedure: COLONOSCOPY WITH PROPOFOL;  Surgeon: Ladene Artist, MD;  Location: [facility];  Service: Endoscopy;  Laterality: N/A;   COLONOSCOPY WITH PROPOFOL N/A 06/24/2021   Procedure: COLONOSCOPY WITH PROPOFOL;  Surgeon: Ladene Artist, MD;  Location: [facility];  Service: Endoscopy;  Laterality: N/A;   CORONARY ARTERY BYPASS GRAFT  2005   LIMA GRAFT TO LAD, SAPHENOUS VEIN GRAFT TO THE FIRST DIAGONAL, AND LEFT RADIAL ARTERY GRAFT TO THE OM   ESOPHAGOGASTRODUODENOSCOPY (EGD) WITH PROPOFOL N/A 08/07/2017   Procedure: ESOPHAGOGASTRODUODENOSCOPY (EGD) WITH PROPOFOL;  Surgeon: Ladene Artist, MD;  Location: [facility];  Service: Endoscopy;  Laterality: N/A;   FOOT SURGERY Right    JOINT REPLACEMENT     LITHOTRIPSY     LUMBAR  LAMINECTOMY/DECOMPRESSION MICRODISCECTOMY N/A 09/08/2015   Procedure: CENTRAL DECOMPRESSIVE LUMBAR LAMINECTOMIES L3-4, L4-5 AND BILATERAL HEMILAMINECTOMY L5-S1;  Surgeon: Jessy Oto, MD;  Location: [facility];  Service: Orthopedics;  Laterality: N/A;   nodule removed from left elbow     POLYPECTOMY  06/24/2021   Procedure: POLYPECTOMY;  Surgeon: Ladene Artist, MD;  Location: [facility];  Service: Endoscopy;;   RIGHT/LEFT HEART CATH AND CORONARY/GRAFT ANGIOGRAPHY N/A 10/06/2020   Procedure: RIGHT/LEFT HEART CATH AND CORONARY/GRAFT ANGIOGRAPHY;  Surgeon: Martinique, Edgar Reisz M, MD;  Location: [facility];  Service: Cardiovascular;  Laterality: N/A;   SHOULDER ARTHROSCOPY     TOTAL KNEE ARTHROPLASTY Bilateral    TOTAL SHOULDER ARTHROPLASTY     TRIPLE BYPASS  04/27/05   US ECHOCARDIOGRAPHY  01/06/2009   EF 55-60%   WRIST SURGERY Left     Social History   Tobacco Use  Smoking Status Never  Smokeless Tobacco Never    Social History   Substance and Sexual Activity  Alcohol Use No   Alcohol/week: 0.0 standard drinks    Family History  Problem Relation Age of Onset   Heart disease Mother    Hyperlipidemia Mother    Lung cancer Father    Diabetes Father    Hyperlipidemia Father    Heart disease Sister    Hypertension Sister    Hyperlipidemia Sister    Heart disease Brother    Diabetes Brother    Hypertension Brother    Hyperlipidemia Brother    Lung cancer Brother    Heart disease Brother    Hypertension Brother    Hyperlipidemia Brother    Heart disease Brother    Hypertension Brother    Hyperlipidemia Brother    Heart disease Maternal Aunt    Asthma Maternal Uncle    Heart disease Maternal Uncle     Review of Systems: As noted history of present illness.  All other systems were reviewed and are negative.  Physical Exam: BP 132/68 (BP Location: Left Arm, Patient Position: Sitting, Cuff Size: Large)    Pulse (!) 57    Ht 5' 4" (1.626 m)    Wt 230 lb (104.3 kg)    BMI  39.48 kg/m  GENERAL:  Well appearing obese WF uses a cane to walk. HEENT:  PERRL, EOMI, sclera are clear. Oropharynx is clear. NECK:  No jugular venous distention, carotid upstroke brisk and symmetric, no bruits, no thyromegaly or adenopathy LUNGS:  Clear to auscultation bilaterally CHEST:  Unremarkable HEART:  RRR,  PMI not displaced or sustained,S1 and S2 within normal limits, no S3, no S4: no clicks, no rubs, no murmurs ABD:  Soft, nontender. BS +, no masses or bruits. No hepatomegaly,  no splenomegaly EXT:  2 + pulses throughout, no edema, no cyanosis no clubbing. Old surgical scar left wrist from radial harvest.  SKIN:  Warm and dry.  No rashes NEURO:  Alert and oriented x 3. Cranial nerves II through XII intact. PSYCH:  Cognitively intact    LABORATORY DATA:  Lab Results  Component Value Date   WBC 6.8 07/08/2021   HGB 13.3 07/08/2021   HCT 40.5 07/08/2021   PLT 178 07/08/2021   GLUCOSE 147 (H) 07/08/2021   CHOL 210 (H) 10/02/2020   TRIG 188 (H) 10/02/2020   HDL 52 10/02/2020   LDLCALC 125 (H) 10/02/2020   ALT 19 07/08/2021   AST 23 07/08/2021   NA 141 07/08/2021   K 4.2 07/08/2021   CL 103 07/08/2021   CREATININE 0.55 (L) 07/08/2021   BUN 16 07/08/2021   CO2 29 07/08/2021   TSH 2.93 07/28/2017   INR 1.0 10/02/2020   HGBA1C 8.1 (H) 02/26/2019   Labs dated 08/02/16: cholesterol 286, triglycerides 181, HDL 52, LDL 198, A1c 6.5%. Dated 08/25/17: cholesterol 257, triglycerides 121, HDL 59, LDL 174. A1c 7.6%. Normal chemistries, TSH, Hgb.  Dated 07/03/19: cholesterol 192, triglycerides 159, HDL 45, LDL 115. A1c 6.0%.  Dated 11/19/19: cholesterol 195, triglycerides 126, HDL 48, LDL 120.  Dated 08/18/20: A1c 7%.  Dated 12/08/20: cholesterol 139, triglycerides 127, HDL 42, LDL 72.  Dated 04/23/21: A1c 6.6%  Ecg today shows NSR rate 57. Nonspecific TWA. I have personally reviewed and interpreted this study.    Echo: 2/16/15Study Conclusions  - Left ventricle: The cavity size  was normal. Wall thickness   was normal. Systolic function was normal. The estimated   ejection fraction was in the range of 50% to 55%. - Mitral valve: Nodular calcification of anterior leaflet   tip Mild regurgitation. - Left atrium: The atrium was moderately dilated. - Atrial septum: No defect or patent foramen ovale was   identified. - Pericardium, extracardiac: A trivial pericardial effusion   was identified posterior to the heart. - Impressions: Elevated E/E' ratio suggested diastolic   dysfunction with elevated EDP Impressions:  - Elevated E/E' ratio suggested diastolic dysfunction with   elevated EDP   Myoview 09/29/16: Study Highlights     The left ventricular ejection fraction is mildly decreased (45-54%). Nuclear stress EF: 51%. There was no ST segment deviation noted during stress. The study is normal. This is a low risk study.   Normal stress nuclear study with no ischemia or infarction; EF 51 with normal wall motion; cannot R/O left breast mass; suggest mammogram if not performed recently.     Echo 09/20/17: Study Conclusions   - Left ventricle: Wall thickness was increased in a pattern of    moderate LVH. Systolic function was normal. The estimated    ejection fraction was in the range of 55% to 60%. Features are    consistent with a pseudonormal left ventricular filling pattern,    with concomitant abnormal relaxation and increased filling    pressure (grade 2 diastolic dysfunction).   Cardiac cath 10/06/20:  RIGHT/LEFT HEART CATH AND CORONARY/GRAFT ANGIOGRAPHY    Conclusion    Prox LAD to Mid LAD lesion is 100% stenosed. 1st Diag lesion is 80% stenosed. 1st Mrg lesion is 100% stenosed. 1st Sept lesion is 90% stenosed. RPDA lesion is 90% stenosed. 2nd RPL lesion is 70% stenosed. RPAV lesion is 50% stenosed. SVG graft was visualized by angiography. Origin to Prox Graft lesion is 100% stenosed. Left radial artery  was visualized by angiography and  is normal in caliber. °LIMA graft was visualized by non-selective angiography and is normal in caliber. °The left ventricular systolic function is normal. °LV end diastolic pressure is normal. °The left ventricular ejection fraction is 55-65% by visual estimate. °Hemodynamic findings consistent with mild pulmonary hypertension. °  °1. 3 vessel obstructive CAD °   - 100% mid LAD °   - 80% first diagonal, 90% first septal perforator °   - 100% OM1 °   - 90% mid PDA, 70% PLOM2 °2. Patent LIMA to the LAD °3. Patent radial artery graft to the OM1 °4. Occluded SVG to diagonal °5. Normal LV function °6. Normal LV filling pressures mean PCWP 12 mm Hg °7. Mild pulmonary HTN with PA mean pressure 25 mm Hg °8. Normal cardiac output. °  °Plan: recommend continued medical therapy. While the diagonal and PDA could be causing some ischemia these vessels are relatively small and not ideal for PCI. I suspect based on these findings that most of her symptoms are more related to pulmonary disease and deconditioning/obesity.  °  °Coronary Diagrams ° ° °Diagnostic °Dominance: Right ° ° ° °Intervention ° °Hemo Data ° °Flowsheet Row Most Recent Value  °Fick Cardiac Output 5.89 L/min  °Fick Cardiac Output Index 2.77 (L/min)/BSA  °RA A Wave 9 mmHg  °RA V Wave 6 mmHg  °RA Mean 4 mmHg  °RV Systolic Pressure 42 mmHg  °RV Diastolic Pressure 1 mmHg  °RV EDP 8 mmHg  °PA Systolic Pressure 44 mmHg  °PA Diastolic Pressure 9 mmHg  °PA Mean 25 mmHg  °PW A Wave 17 mmHg  °PW V Wave 17 mmHg  °PW Mean 12 mmHg  °AO Systolic Pressure 163 mmHg  °AO Diastolic Pressure 69 mmHg  °AO Mean 103 mmHg  °LV Systolic Pressure 187 mmHg  °LV Diastolic Pressure 13 mmHg  °LV EDP 24 mmHg  °AOp Systolic Pressure 174 mmHg  °AOp Diastolic Pressure 75 mmHg  °AOp Mean Pressure 114 mmHg  °LVp Systolic Pressure 181 mmHg  °LVp Diastolic Pressure 11 mmHg  °LVp EDP Pressure 24 mmHg  °QP/QS 1  °TPVR Index 9.05 HRUI  °TSVR Index 37.27 HRUI  °PVR SVR Ratio 0.13  °TPVR/TSVR Ratio 0.24   ° ° ° ° °Assessment / Plan: °1. Coronary disease.  She is status post CABG in 2005. Nuclear stress test in March 2018 was normal.  She Is on optimal medical therapy.  Continue Bystolic and amlodipine. On ASA. Cardiac cath last year showed occlusion of SVG to diagonal with patent LIMA to LAD and radial graft to OM. Mild pulmonary HTN with normal LV filling pressures. Symptoms are stable.  ° °2. Hypertension, well controlled. Continue current medication ° °3. Hyperlipidemia.  Statin intolerant. Now only on Zetia. LDL improved from 174>>120. Now on Nexletol. LDL improved to 72. Due for follow up lab work with Dr Perini.  ° °4. Diabetes mellitus type 2. ° °5. Morbid obesity with OSA ° °6.  Dyspne on exertion. Multifactorial but no evidence of CHF with recent hemodynamics.  ° ° ° ° ° °

## 2021-09-02 ENCOUNTER — Other Ambulatory Visit: Payer: Self-pay

## 2021-09-02 ENCOUNTER — Encounter: Payer: Self-pay | Admitting: Cardiology

## 2021-09-02 ENCOUNTER — Ambulatory Visit: Payer: Medicare HMO | Admitting: Cardiology

## 2021-09-02 VITALS — BP 132/68 | HR 57 | Ht 64.0 in | Wt 230.0 lb

## 2021-09-02 DIAGNOSIS — I10 Essential (primary) hypertension: Secondary | ICD-10-CM | POA: Diagnosis not present

## 2021-09-02 DIAGNOSIS — R0602 Shortness of breath: Secondary | ICD-10-CM

## 2021-09-02 DIAGNOSIS — I25708 Atherosclerosis of coronary artery bypass graft(s), unspecified, with other forms of angina pectoris: Secondary | ICD-10-CM | POA: Diagnosis not present

## 2021-09-02 DIAGNOSIS — E7800 Pure hypercholesterolemia, unspecified: Secondary | ICD-10-CM | POA: Diagnosis not present

## 2021-09-13 DIAGNOSIS — Z8041 Family history of malignant neoplasm of ovary: Secondary | ICD-10-CM | POA: Diagnosis not present

## 2021-09-13 DIAGNOSIS — Z853 Personal history of malignant neoplasm of breast: Secondary | ICD-10-CM | POA: Diagnosis not present

## 2021-09-20 DIAGNOSIS — Z79899 Other long term (current) drug therapy: Secondary | ICD-10-CM | POA: Diagnosis not present

## 2021-09-20 DIAGNOSIS — M069 Rheumatoid arthritis, unspecified: Secondary | ICD-10-CM | POA: Diagnosis not present

## 2021-09-20 DIAGNOSIS — M47812 Spondylosis without myelopathy or radiculopathy, cervical region: Secondary | ICD-10-CM | POA: Diagnosis not present

## 2021-09-20 DIAGNOSIS — M5030 Other cervical disc degeneration, unspecified cervical region: Secondary | ICD-10-CM | POA: Diagnosis not present

## 2021-09-20 DIAGNOSIS — M47894 Other spondylosis, thoracic region: Secondary | ICD-10-CM | POA: Diagnosis not present

## 2021-09-27 ENCOUNTER — Ambulatory Visit: Payer: Self-pay

## 2021-09-27 ENCOUNTER — Other Ambulatory Visit: Payer: Self-pay

## 2021-09-27 ENCOUNTER — Encounter: Payer: Self-pay | Admitting: Orthopedic Surgery

## 2021-09-27 ENCOUNTER — Ambulatory Visit: Payer: Medicare HMO | Admitting: Orthopedic Surgery

## 2021-09-27 DIAGNOSIS — M79641 Pain in right hand: Secondary | ICD-10-CM | POA: Diagnosis not present

## 2021-09-27 DIAGNOSIS — M65331 Trigger finger, right middle finger: Secondary | ICD-10-CM | POA: Diagnosis not present

## 2021-09-27 DIAGNOSIS — M65332 Trigger finger, left middle finger: Secondary | ICD-10-CM | POA: Diagnosis not present

## 2021-09-27 DIAGNOSIS — M79642 Pain in left hand: Secondary | ICD-10-CM

## 2021-09-27 DIAGNOSIS — M65341 Trigger finger, right ring finger: Secondary | ICD-10-CM | POA: Diagnosis not present

## 2021-10-08 ENCOUNTER — Other Ambulatory Visit: Payer: Self-pay | Admitting: Physician Assistant

## 2021-10-08 DIAGNOSIS — M0609 Rheumatoid arthritis without rheumatoid factor, multiple sites: Secondary | ICD-10-CM

## 2021-10-11 NOTE — Telephone Encounter (Signed)
Next Visit: 12/07/2021 ? ?Last Visit: 07/08/2021 ? ?Labs: 07/08/2021 CBC WNL. Glucose is 147.  Creatinine is borderline elevated.  ALK phos is borderline low. Rest of CMP WNL.   ? ?Eye exam: 10/26/2020 WNL   ? ?Current Dose per office note 07/08/2021: Plaquenil 200 mg 1 tablet by mouth twice daily ? ?[id number] arthritis of multiple sites with negative rheumatoid factor  ? ?Last Fill: 06/23/2021 ? ?Okay to refill Plaquenil?  ?

## 2021-10-18 DIAGNOSIS — M5030 Other cervical disc degeneration, unspecified cervical region: Secondary | ICD-10-CM | POA: Diagnosis not present

## 2021-10-18 DIAGNOSIS — Z79899 Other long term (current) drug therapy: Secondary | ICD-10-CM | POA: Diagnosis not present

## 2021-10-18 DIAGNOSIS — M47816 Spondylosis without myelopathy or radiculopathy, lumbar region: Secondary | ICD-10-CM | POA: Diagnosis not present

## 2021-10-18 DIAGNOSIS — M47812 Spondylosis without myelopathy or radiculopathy, cervical region: Secondary | ICD-10-CM | POA: Diagnosis not present

## 2021-10-18 DIAGNOSIS — M47894 Other spondylosis, thoracic region: Secondary | ICD-10-CM | POA: Diagnosis not present

## 2021-10-18 DIAGNOSIS — M069 Rheumatoid arthritis, unspecified: Secondary | ICD-10-CM | POA: Diagnosis not present

## 2021-10-20 DIAGNOSIS — Z79899 Other long term (current) drug therapy: Secondary | ICD-10-CM | POA: Diagnosis not present

## 2021-10-26 DIAGNOSIS — J069 Acute upper respiratory infection, unspecified: Secondary | ICD-10-CM | POA: Diagnosis not present

## 2021-10-26 DIAGNOSIS — R051 Acute cough: Secondary | ICD-10-CM | POA: Diagnosis not present

## 2021-10-26 DIAGNOSIS — M65341 Trigger finger, right ring finger: Secondary | ICD-10-CM

## 2021-10-26 DIAGNOSIS — M65332 Trigger finger, left middle finger: Secondary | ICD-10-CM

## 2021-10-26 DIAGNOSIS — J441 Chronic obstructive pulmonary disease with (acute) exacerbation: Secondary | ICD-10-CM | POA: Diagnosis not present

## 2021-10-26 DIAGNOSIS — M65331 Trigger finger, right middle finger: Secondary | ICD-10-CM | POA: Diagnosis not present

## 2021-10-26 DIAGNOSIS — Z1152 Encounter for screening for COVID-19: Secondary | ICD-10-CM | POA: Diagnosis not present

## 2021-10-26 DIAGNOSIS — M069 Rheumatoid arthritis, unspecified: Secondary | ICD-10-CM | POA: Diagnosis not present

## 2021-10-26 DIAGNOSIS — M79641 Pain in right hand: Secondary | ICD-10-CM | POA: Diagnosis not present

## 2021-10-26 DIAGNOSIS — R5383 Other fatigue: Secondary | ICD-10-CM | POA: Diagnosis not present

## 2021-10-26 DIAGNOSIS — J45909 Unspecified asthma, uncomplicated: Secondary | ICD-10-CM | POA: Diagnosis not present

## 2021-10-26 DIAGNOSIS — R0981 Nasal congestion: Secondary | ICD-10-CM | POA: Diagnosis not present

## 2021-10-26 DIAGNOSIS — M79642 Pain in left hand: Secondary | ICD-10-CM | POA: Diagnosis not present

## 2021-10-26 DIAGNOSIS — J302 Other seasonal allergic rhinitis: Secondary | ICD-10-CM | POA: Diagnosis not present

## 2021-10-26 MED ORDER — BETAMETHASONE SOD PHOS & ACET 6 (3-3) MG/ML IJ SUSP
6.0000 mg | INTRAMUSCULAR | Status: AC | PRN
  Administered 2021-10-26: 6 mg via INTRA_ARTICULAR

## 2021-10-26 MED ORDER — LIDOCAINE HCL 1 % IJ SOLN
1.0000 mL | INTRAMUSCULAR | Status: AC | PRN
  Administered 2021-10-26: 1 mL

## 2021-10-26 NOTE — Progress Notes (Signed)
? ?Office Visit Note ?  ?Patient: [name]           ?Date of Birth: 1946/02/13           ?MRN: [id number] ?Visit Date: 09/27/2021 ?             ?Requested by: Crist Infante, MD ?[address] ?[place],  [place] [address] ?PCP: Crist Infante, MD ? ? ?Assessment & Plan: ?Visit Diagnoses:  ?1. Pain in right hand   ?2. Pain in left hand   ?3. Trigger finger, left middle finger   ?4. Trigger finger, right ring finger   ?5. Trigger finger, right middle finger   ? ? ?Plan: We discussed the diagnosis, prognosis, non-operative and operative treatment options for her trigger fingers. ? ?After our discussion, the patient would like to proceed with corticosteroid injection.  We reviewed the risks and benefits of conservative management.  The patient expressed understanding of the reasoning and strategy going forward. ? ?All patient questions and concerns were addressed.   ? ?Follow-Up Instructions: No follow-ups on file.  ? ?Orders:  ?Orders Placed This Encounter  ?Procedures  ? XR Hand Complete Right  ? XR Hand Complete Left  ? ?No orders of the defined types were placed in this encounter. ? ? ? ? Procedures: ?Hand/UE Inj: R long A1 for trigger finger on 10/26/2021 11:17 AM ?Indications: tendon swelling and therapeutic ?Details: 25 G needle, volar approach ?Medications: 1 mL lidocaine 1 %; 6 mg betamethasone acetate-betamethasone sodium phosphate 6 (3-3) MG/ML ?Procedure, treatment alternatives, risks and benefits explained, specific risks discussed. Consent was given by the patient. Immediately prior to procedure a time out was called to verify the correct patient, procedure, equipment, support staff and site/side marked as required. Patient was prepped and draped in the usual sterile fashion.  ? ? ?Hand/UE Inj: R ring A1 for trigger finger on 10/26/2021 11:18 AM ?Indications: therapeutic and tendon swelling ?Details: 25 G needle, volar approach ?Medications: 1 mL lidocaine 1 %; 6 mg betamethasone acetate-betamethasone sodium  phosphate 6 (3-3) MG/ML ?Procedure, treatment alternatives, risks and benefits explained, specific risks discussed. Consent was given by the patient. Immediately prior to procedure a time out was called to verify the correct patient, procedure, equipment, support staff and site/side marked as required. Patient was prepped and draped in the usual sterile fashion.  ? ? ?Hand/UE Inj: L long A1 for trigger finger on 10/26/2021 11:18 AM ?Indications: tendon swelling and therapeutic ?Details: 25 G needle, volar approach ?Medications: 1 mL lidocaine 1 %; 6 mg betamethasone acetate-betamethasone sodium phosphate 6 (3-3) MG/ML ?Procedure, treatment alternatives, risks and benefits explained, specific risks discussed. Consent was given by the patient. Immediately prior to procedure a time out was called to verify the correct patient, procedure, equipment, support staff and site/side marked as required. Patient was prepped and draped in the usual sterile fashion.  ? ? ? ? ?Clinical Data: ?No additional findings. ? ? ?Subjective: ?Chief Complaint  ?Patient presents with  ? Left Hand - New Patient (Initial Visit)  ? Right Hand - New Patient (Initial Visit)  ? ? ?This is a 76 year old right-hand-dominant female who presents with painful triggering of fingers on both hands.  She describes locking of her left middle finger and right middle and ring fingers.  This been going on for some months now.  She notes that her fingers will lock in a flexed position will occasionally require her to use her contralateral hand to unlock them.  This is quite bothersome  for her.  She has not had any treatment for this issue yet. ? ? ?Review of Systems ? ? ?Objective: ?Vital Signs: There were no vitals taken for this visit. ? ?Physical Exam ?Constitutional:   ?   Appearance: Normal appearance.  ?Cardiovascular:  ?   Rate and Rhythm: Normal rate.  ?   Pulses: Normal pulses.  ?Pulmonary:  ?   Effort: Pulmonary effort is normal.  ?Skin: ?   General:  Skin is warm and dry.  ?   Capillary Refill: Capillary refill takes less than 2 seconds.  ?Neurological:  ?   Mental Status: She is alert.  ? ? ?Right Hand Exam  ? ?Tenderness  ?Right hand tenderness location: TTP at A1 pulley of ring and middle fingers with mild swelling. ? ?Other  ?Erythema: absent ?Sensation: normal ?Pulse: present ? ?Comments:  Palpable and visible triggering of right middle and ring fingers.  ? ? ?Left Hand Exam  ? ?Tenderness  ?Left hand tenderness location: TTP at middle finger A1 pulley.  ? ?Other  ?Erythema: absent ?Sensation: normal ?Pulse: present ? ?Comments:  Palpable and visible triggering of left middle finger.  ? ? ? ? ?Specialty Comments:  ?No specialty comments available. ? ?Imaging: ?No results found. ? ? ?PMFS History: ?Patient Active Problem List  ? Diagnosis Date Noted  ? Trigger finger, left middle finger 10/26/2021  ? Trigger finger, right ring finger 10/26/2021  ? Trigger finger, right middle finger 10/26/2021  ? Benign neoplasm of ascending colon   ? Hx of adenomatous colonic polyps   ? Occult blood in stools   ? Unstable angina ([place]) 10/06/2020  ? Acute on chronic respiratory failure with hypoxia ([place]) 02/28/2019  ? OSA on CPAP 02/28/2019  ? Diabetes mellitus type 2, uncontrolled, with complications 27/12/2374  ? Breast cancer ([place]) 02/27/2019  ? History of cancer chemotherapy 02/27/2019  ? History of radiation therapy 02/27/2019  ? Diabetic neuropathy ([place]) 02/27/2019  ? HLD (hyperlipidemia) 02/27/2019  ? Essential hypertension 02/27/2019  ? QT prolongation 02/27/2019  ? Pneumonia due to COVID-19 virus 02/24/2019  ? Diarrhea   ? Benign neoplasm of cecum   ? Abdominal pain, epigastric   ? Gastroesophageal reflux disease   ? Dependence on nocturnal oxygen therapy 07/10/2017  ? Open wound of left lower leg 12/26/2016  ? Vitamin D deficiency 09/14/2016  ? History of total knee replacement, bilateral 09/14/2016  ? DJD (degenerative joint disease), cervical 09/14/2016  ?  Spondylosis of lumbar region without myelopathy or radiculopathy 09/14/2016  ? High risk medication use 05/20/2016  ? Spinal stenosis, lumbar region, with neurogenic claudication 09/08/2015  ?  Class: Chronic  ? OSA (obstructive sleep apnea) 10/17/2013  ? Dyspnea 09/04/2013  ? Atypical chest pain 09/04/2013  ? Morbid obesity ([place]) 03/21/2012  ? Cancer of lower-outer quadrant of female breast ([place]) 03/15/2012  ? Diabetes mellitus, type II ([place]) 02/22/2011  ? Coronary artery disease   ? Hypertension   ? Hyperlipidemia   ? OA (osteoarthritis) of knee   ? ?Past Medical History:  ?Diagnosis Date  ? Actinic keratosis   ? Anxiety   ? Asthma   ? Albuterol inhaler as needed.Pulmicort neb as needed  ? Breast cancer ([place])   ? right - lumpectomy   ? Chronic back pain   ? spinal stenosis  ? Coronary artery disease   ? COVID   ? uses o2 since at hs  ? Dependence on nocturnal oxygen therapy 07/10/2017  ? Pt uses 2 liters 02  at night   ? Depression   ? takes CYmbalta and Wellbutrin daily  ? Diabetes mellitus   ? not on any meds/controlled by diet  ? Dyspnea   ? with exertion occasionally when lies down  ? GERD (gastroesophageal reflux disease)   ? takes Omeprazole daily  ? Headache   ? oocasionally  ? History of kidney stones   ? Hyperlipidemia   ? not on any meds  ? Hypertension   ? takes Benazepril,Bystolic,and Amlodipine daily  ? IBS (irritable bowel syndrome)   ? Joint pain   ? Joint swelling   ? Livedoid vasculitis   ? Nocturia   ? OA (osteoarthritis) of knee   ? Other seborrheic keratosis   ? Oxygen deficiency   ? 2 liters at night per pt for OSA- no cpap   ? Peripheral edema   ? takes daily as needed  ? Peripheral neuropathy   ? Personal history of chemotherapy   ? Personal history of radiation therapy   ? Pneumonia   ? hx of-several yrs ago  ? RA (rheumatoid arthritis) ([place])   ? Urinary frequency   ? Urinary urgency   ? Weakness   ? numbness and tingling mainly in left leg occasionally in right.Tingling/numbness in hands  ?   ?Family History  ?Problem Relation Age of Onset  ? Heart disease Mother   ? Hyperlipidemia Mother   ? Lung cancer Father   ? Diabetes Father   ? Hyperlipidemia Father   ? Heart disease Sister   ? Hypertension

## 2021-11-01 ENCOUNTER — Ambulatory Visit: Payer: Medicare HMO | Admitting: Podiatry

## 2021-11-01 DIAGNOSIS — I739 Peripheral vascular disease, unspecified: Secondary | ICD-10-CM | POA: Diagnosis not present

## 2021-11-01 DIAGNOSIS — L84 Corns and callosities: Secondary | ICD-10-CM

## 2021-11-01 DIAGNOSIS — M216X2 Other acquired deformities of left foot: Secondary | ICD-10-CM | POA: Diagnosis not present

## 2021-11-01 DIAGNOSIS — L97322 Non-pressure chronic ulcer of left ankle with fat layer exposed: Secondary | ICD-10-CM

## 2021-11-03 NOTE — Progress Notes (Signed)
Subjective: ?76 year old female presents the office today for concerns of recurrent callus on her left foot which is causing discomfort.  Denies any swelling or redness or any drainage to this area.  She is also had a longstanding wound on the left leg.  Noticed this last year and saw her and she is being treated by her primary care physician.  She states the wound is still present and she cannot get it to fully heal.  Denies any purulence.  No significant pain.  Denies any fevers or chills.  No other concerns. ? ?Objective: ?AAO x3, NAD ?DP/PT pulses palpable bilateral, CRT less than 3 seconds ?On the left foot submetatarsal 5 cm left lesion.  Upon debridement there is no underlying ulceration drainage or any signs of infection noted today. ?On the medial aspect of the left ankle superficial area skin breakdown with mild chronic erythema.  There is no drainage or pus.  No fluctuation crepitation.  There is no malodor. ?No pain with calf compression, swelling, warmth, erythema ? ? ? ? ?Assessment: ?Preulcerative callus left foot; chronic ulceration medial ankle ? ?Plan: ?-All treatment options discussed with the patient including all alternatives, risks, complications.  ?-Started with a hyperkeratotic lesion left foot with any complications or bleeding.  Continue moisturizer, offloading. ?-Given ongoing nature of the ankle wound I will order an ABI.  I have also ordered dressings.  Delivered through [facility] including silver alginate.  Discussed daily dressing changes with this.  For now continue with a similar dressing she has been using clean the wound gel.  This has been helping as she recently switched this over the last couple weeks.  Monitor closely for signs or symptoms of infection.  Compression, elevation. ?-Patient encouraged to call the office with any questions, concerns, change in symptoms.  ? ?Trula Slade DPM ? ?

## 2021-11-08 ENCOUNTER — Telehealth: Payer: Self-pay | Admitting: Podiatry

## 2021-11-08 NOTE — Telephone Encounter (Signed)
Ms Chalk called and wants to know if you want her to have another ABI w/wo TBI done , she had one 2 years ago?  And [facility] asked if she should be having it done that soon? Please Advise she is scheduled with them on Thursday 11/11/20

## 2021-11-10 NOTE — Telephone Encounter (Signed)
Called patient back , and let her know that Dr Jacqualyn Posey wants her to have the ABI.  She will keep appointment on Thursday

## 2021-11-11 ENCOUNTER — Ambulatory Visit (HOSPITAL_COMMUNITY)
Admission: RE | Admit: 2021-11-11 | Discharge: 2021-11-11 | Disposition: A | Discharge status: Home / Self Care | Payer: Medicare HMO | Source: Ambulatory Visit | Attending: Podiatry | Admitting: Podiatry

## 2021-11-11 DIAGNOSIS — L97322 Non-pressure chronic ulcer of left ankle with fat layer exposed: Secondary | ICD-10-CM

## 2021-11-18 DIAGNOSIS — M069 Rheumatoid arthritis, unspecified: Secondary | ICD-10-CM | POA: Diagnosis not present

## 2021-11-18 DIAGNOSIS — M47812 Spondylosis without myelopathy or radiculopathy, cervical region: Secondary | ICD-10-CM | POA: Diagnosis not present

## 2021-11-18 DIAGNOSIS — E1140 Type 2 diabetes mellitus with diabetic neuropathy, unspecified: Secondary | ICD-10-CM | POA: Diagnosis not present

## 2021-11-18 DIAGNOSIS — Z79899 Other long term (current) drug therapy: Secondary | ICD-10-CM | POA: Diagnosis not present

## 2021-11-18 DIAGNOSIS — M5030 Other cervical disc degeneration, unspecified cervical region: Secondary | ICD-10-CM | POA: Diagnosis not present

## 2021-11-23 NOTE — Progress Notes (Signed)
? ?Office Visit Note ? ?Patient: [name]             ?Date of Birth: Aug 22, 1945           ?MRN: [id number]             ?PCP: Crist Infante, MD ?Referring: Crist Infante, MD ?Visit Date: 12/07/2021 ?Occupation: '@GUAROCC'$ @ ? ?Subjective:  ?Pain in hands and feet ? ?History of Present Illness: [name] is a 76 y.o. female with history of rheumatoid arthritis.  She has been taking hydroxychloroquine on a regular basis.  She continues to have some pain and stiffness in her hands and her feet.  She states she still has nonhealing wound on her left lower extremity.  She has been followed by Dr. Jacqualyn Posey.  Her bilateral total knee replacements are doing well.  She continues to have some discomfort in her cervical and lumbar spine. ? ?Activities of Daily Living:  ?Patient reports morning stiffness for 2-3 hours.   ?Patient Reports nocturnal pain.  ?Difficulty dressing/grooming: Reports ?Difficulty climbing stairs: Reports ?Difficulty getting out of chair: Reports ?Difficulty using hands for taps, buttons, cutlery, and/or writing: Reports ? ?Review of Systems  ?Constitutional:  Positive for fatigue.  ?HENT:  Positive for mouth dryness and sore tongue. Negative for mouth sores and nose dryness.   ?Eyes:  Negative for pain, itching and dryness.  ?Respiratory:  Positive for shortness of breath. Negative for difficulty breathing.   ?     Unchanged since COVID 19 virus infection  ?Cardiovascular:  Negative for chest pain and palpitations.  ?Gastrointestinal:  Positive for diarrhea. Negative for blood in stool and constipation.  ?Endocrine: Negative for increased urination.  ?Genitourinary:  Negative for difficulty urinating.  ?Musculoskeletal:  Positive for joint pain, joint pain, joint swelling, myalgias, morning stiffness and myalgias. Negative for muscle tenderness.  ?Skin:  Positive for color change. Negative for rash, redness and sensitivity to sunlight.  ?Allergic/Immunologic: Positive for susceptible to infections.   ?Neurological:  Positive for numbness, headaches, memory loss and weakness. Negative for dizziness.  ?Hematological:  Negative for bruising/bleeding tendency and swollen glands.  ?Psychiatric/Behavioral:  Negative for confusion.   ? ?PMFS History:  ?Patient Active Problem List  ? Diagnosis Date Noted  ? Trigger finger, left middle finger 10/26/2021  ? Trigger finger, right ring finger 10/26/2021  ? Trigger finger, right middle finger 10/26/2021  ? Benign neoplasm of ascending colon   ? Hx of adenomatous colonic polyps   ? Occult blood in stools   ? Unstable angina ([place]) 10/06/2020  ? Acute on chronic respiratory failure with hypoxia ([place]) 02/28/2019  ? OSA on CPAP 02/28/2019  ? Diabetes mellitus type 2, uncontrolled, with complications 03/54/6568  ? Breast cancer ([place]) 02/27/2019  ? History of cancer chemotherapy 02/27/2019  ? History of radiation therapy 02/27/2019  ? Diabetic neuropathy ([place]) 02/27/2019  ? HLD (hyperlipidemia) 02/27/2019  ? Essential hypertension 02/27/2019  ? QT prolongation 02/27/2019  ? Pneumonia due to COVID-19 virus 02/24/2019  ? Diarrhea   ? Benign neoplasm of cecum   ? Abdominal pain, epigastric   ? Gastroesophageal reflux disease   ? Dependence on nocturnal oxygen therapy 07/10/2017  ? Open wound of left lower leg 12/26/2016  ? Vitamin D deficiency 09/14/2016  ? History of total knee replacement, bilateral 09/14/2016  ? DJD (degenerative joint disease), cervical 09/14/2016  ? Spondylosis of lumbar region without myelopathy or radiculopathy 09/14/2016  ? High risk medication use 05/20/2016  ? Spinal stenosis, lumbar region,  with neurogenic claudication 09/08/2015  ?  Class: Chronic  ? OSA (obstructive sleep apnea) 10/17/2013  ? Dyspnea 09/04/2013  ? Atypical chest pain 09/04/2013  ? Morbid obesity ([place]) 03/21/2012  ? Cancer of lower-outer quadrant of female breast ([place]) 03/15/2012  ? Diabetes mellitus, type II ([place]) 02/22/2011  ? Coronary artery disease   ? Hypertension   ? Hyperlipidemia    ? OA (osteoarthritis) of knee   ?  ?Past Medical History:  ?Diagnosis Date  ? Actinic keratosis   ? Anxiety   ? Asthma   ? Albuterol inhaler as needed.Pulmicort neb as needed  ? Breast cancer ([place])   ? right - lumpectomy   ? Chronic back pain   ? spinal stenosis  ? Coronary artery disease   ? COVID   ? uses o2 since at hs  ? Dependence on nocturnal oxygen therapy 07/10/2017  ? Pt uses 2 liters 02 at night   ? Depression   ? takes CYmbalta and Wellbutrin daily  ? Diabetes mellitus   ? not on any meds/controlled by diet  ? Dyspnea   ? with exertion occasionally when lies down  ? GERD (gastroesophageal reflux disease)   ? takes Omeprazole daily  ? Headache   ? oocasionally  ? History of kidney stones   ? Hyperlipidemia   ? not on any meds  ? Hypertension   ? takes Benazepril,Bystolic,and Amlodipine daily  ? IBS (irritable bowel syndrome)   ? Joint pain   ? Joint swelling   ? Livedoid vasculitis   ? Nocturia   ? OA (osteoarthritis) of knee   ? Other seborrheic keratosis   ? Oxygen deficiency   ? 2 liters at night per pt for OSA- no cpap   ? Peripheral edema   ? takes daily as needed  ? Peripheral neuropathy   ? Personal history of chemotherapy   ? Personal history of radiation therapy   ? Pneumonia   ? hx of-several yrs ago  ? RA (rheumatoid arthritis) ([place])   ? Urinary frequency   ? Urinary urgency   ? Weakness   ? numbness and tingling mainly in left leg occasionally in right.Tingling/numbness in hands  ?  ?Family History  ?Problem Relation Age of Onset  ? Heart disease Mother   ? Hyperlipidemia Mother   ? Lung cancer Father   ? Diabetes Father   ? Hyperlipidemia Father   ? Heart disease Sister   ? Hypertension Sister   ? Hyperlipidemia Sister   ? Heart disease Brother   ? Diabetes Brother   ? Hypertension Brother   ? Hyperlipidemia Brother   ? Lung cancer Brother   ? Heart disease Brother   ? Hypertension Brother   ? Hyperlipidemia Brother   ? Heart disease Brother   ? Hypertension Brother   ? Hyperlipidemia Brother    ? Heart disease Maternal Aunt   ? Asthma Maternal Uncle   ? Heart disease Maternal Uncle   ? ?Past Surgical History:  ?Procedure Laterality Date  ? BIOPSY  06/24/2021  ? Procedure: BIOPSY;  Surgeon: Ladene Artist, MD;  Location: [facility];  Service: Endoscopy;;  ? BREAST LUMPECTOMY  1997  ? RIGHT BREAST  ? CARDIAC CATHETERIZATION  04/23/2004  ? EF 60%  ? cataract surgery Bilateral   ? COLONOSCOPY    ? COLONOSCOPY WITH PROPOFOL N/A 08/07/2017  ? Procedure: COLONOSCOPY WITH PROPOFOL;  Surgeon: Ladene Artist, MD;  Location: [facility];  Service: Endoscopy;  Laterality:  N/A;  ? COLONOSCOPY WITH PROPOFOL N/A 06/24/2021  ? Procedure: COLONOSCOPY WITH PROPOFOL;  Surgeon: Ladene Artist, MD;  Location: [facility];  Service: Endoscopy;  Laterality: N/A;  ? CORONARY ARTERY BYPASS GRAFT  2005  ? LIMA GRAFT TO LAD, SAPHENOUS VEIN GRAFT TO THE FIRST DIAGONAL, AND LEFT RADIAL ARTERY GRAFT TO THE OM  ? ESOPHAGOGASTRODUODENOSCOPY (EGD) WITH PROPOFOL N/A 08/07/2017  ? Procedure: ESOPHAGOGASTRODUODENOSCOPY (EGD) WITH PROPOFOL;  Surgeon: Ladene Artist, MD;  Location: [facility];  Service: Endoscopy;  Laterality: N/A;  ? FOOT SURGERY Right   ? JOINT REPLACEMENT    ? LITHOTRIPSY    ? LUMBAR LAMINECTOMY/DECOMPRESSION MICRODISCECTOMY N/A 09/08/2015  ? Procedure: CENTRAL DECOMPRESSIVE LUMBAR LAMINECTOMIES L3-4, L4-5 AND BILATERAL HEMILAMINECTOMY L5-S1;  Surgeon: Jessy Oto, MD;  Location: [facility];  Service: Orthopedics;  Laterality: N/A;  ? nodule removed from left elbow    ? POLYPECTOMY  06/24/2021  ? Procedure: POLYPECTOMY;  Surgeon: Ladene Artist, MD;  Location: [facility];  Service: Endoscopy;;  ? RIGHT/LEFT HEART CATH AND CORONARY/GRAFT ANGIOGRAPHY N/A 10/06/2020  ? Procedure: RIGHT/LEFT HEART CATH AND CORONARY/GRAFT ANGIOGRAPHY;  Surgeon: Martinique, Peter M, MD;  Location: [facility];  Service: Cardiovascular;  Laterality: N/A;  ? SHOULDER ARTHROSCOPY    ? TOTAL KNEE ARTHROPLASTY Bilateral   ? TOTAL SHOULDER  ARTHROPLASTY    ? TRIPLE BYPASS  04/27/05  ? US ECHOCARDIOGRAPHY  01/06/2009  ? EF 55-60%  ? WRIST SURGERY Left   ? ?Social History  ? ?Social History Narrative  ? Not on file  ? ?Immunization History

## 2021-11-26 ENCOUNTER — Ambulatory Visit: Payer: Medicare HMO | Admitting: Podiatry

## 2021-11-26 DIAGNOSIS — I739 Peripheral vascular disease, unspecified: Secondary | ICD-10-CM

## 2021-11-26 DIAGNOSIS — L97322 Non-pressure chronic ulcer of left ankle with fat layer exposed: Secondary | ICD-10-CM

## 2021-11-26 DIAGNOSIS — I83023 Varicose veins of left lower extremity with ulcer of ankle: Secondary | ICD-10-CM | POA: Diagnosis not present

## 2021-11-26 DIAGNOSIS — L97329 Non-pressure chronic ulcer of left ankle with unspecified severity: Secondary | ICD-10-CM | POA: Diagnosis not present

## 2021-11-29 NOTE — Progress Notes (Signed)
Subjective: ?76 year old female presents the office today for follow-up evaluation of a wound on the left leg which has been chronic.  She has a history of venous issues and was originally treated at [facility] with a greater saphenous vein ablation.  She then followed up with vein and vascular with Dr. Donzetta Matters.  The patient wears compression socks at times.  She states the wound although better is still present to her leg and is on the other options.  The wound was originally treated by her primary care doctor and she presented for follow-up evaluation with me last appointment. ? ?Objective: ?AAO x3, NAD ?DP/PT pulses palpable bilateral, CRT less than 3 seconds ?On the medial aspect of the left ankle/calf superficial area skin breakdown.  There is no erythema, ascending cellulitis.  No fluctuation or crepitation.  There is malodor.  Somewhat improved compared to last appointment. ?No pain with calf compression, swelling, warmth, erythema ? ? ?Assessment: ?Chronic ulceration medial ankle; venous insufficiency ? ?Plan: ?-All treatment options discussed with the patient including all alternatives, risks, complications.  ?-I reviewed the ABI with her today.  She has triphasic waveforms and normal TBI.  I think the majority of her issues are coming from venous insufficiency.  We will start Unna boot therapy today.  I applied the first [employer] today.  I will have her follow-up next week for new one versus trying at home health for dressing changes.  Encouraged elevation. ?-She was previously had intervention for her veins.  I am not sure there is any other treatment thoughts possible to help with this. She may need to follow back up with [facility].  ?-Monitor for any clinical signs or symptoms of infection and directed to call the office immediately should any occur or go to the ER. ? ?Return in about 1 week (around 12/03/2021) for unna boot. ? ?Trula Slade DPM ? ? ?

## 2021-12-03 ENCOUNTER — Ambulatory Visit: Payer: Medicare HMO | Admitting: Podiatry

## 2021-12-03 DIAGNOSIS — L97322 Non-pressure chronic ulcer of left ankle with fat layer exposed: Secondary | ICD-10-CM | POA: Diagnosis not present

## 2021-12-03 DIAGNOSIS — I739 Peripheral vascular disease, unspecified: Secondary | ICD-10-CM

## 2021-12-03 MED ORDER — DOXYCYCLINE HYCLATE 100 MG PO TABS: 100.0000 mg | ORAL_TABLET | Freq: Two times a day (BID) | ORAL | 0 refills | Status: DC

## 2021-12-06 NOTE — Progress Notes (Signed)
Subjective: ?76 year old female presents the office today for follow-up evaluation of a wound on the left leg which has been chronic.  She presents today for dressing change.  She states the unna boot has been comfortable.  Denies any fevers or chills. ? ?Objective: ?AAO x3, NAD ?DP/PT pulses palpable bilateral, CRT less than 3 seconds ?On the medial aspect of the left ankle/calf superficial area skin breakdown.  There is localized erythema to the area.  Faint amount of yellow drainage noted.  There is no fluctuation or crepitation.  The macerated tissue is noted.  No probing, and or tunneling. ?No pain with calf compression, swelling, warmth, erythema ? ? ?Assessment: ?Chronic ulceration medial ankle; venous insufficiency ? ?Plan: ?-All treatment options discussed with the patient including all alternatives, risks, complications.  ?-She previously seen vascular surgery as well as [facility] and she is continue to have problems with wound healing on her left leg.  She may benefit from follow back up with [facility].  ?-I cleaned the wound today.  She states that previously when the wound would get a little red she would do doxycycline and she responded well.  Prescribed this.  I applied Sorbact dressing along the wound followed by Kerlix, Coflex.  We will see her back next week for dressing change..  Elevation.  Monitor closely for any signs or symptoms of worsening infection. ?-Declined home health care ? ?No follow-ups on file. ? ?Trula Slade DPM ? ? ?

## 2021-12-07 ENCOUNTER — Encounter: Payer: Self-pay | Admitting: Rheumatology

## 2021-12-07 ENCOUNTER — Ambulatory Visit (INDEPENDENT_AMBULATORY_CARE_PROVIDER_SITE_OTHER): Payer: Medicare HMO

## 2021-12-07 ENCOUNTER — Ambulatory Visit: Payer: Medicare HMO | Admitting: Rheumatology

## 2021-12-07 VITALS — BP 163/81 | HR 56 | Ht 64.0 in | Wt 237.2 lb

## 2021-12-07 DIAGNOSIS — L97921 Non-pressure chronic ulcer of unspecified part of left lower leg limited to breakdown of skin: Secondary | ICD-10-CM | POA: Diagnosis not present

## 2021-12-07 DIAGNOSIS — M5136 Other intervertebral disc degeneration, lumbar region: Secondary | ICD-10-CM | POA: Diagnosis not present

## 2021-12-07 DIAGNOSIS — L97322 Non-pressure chronic ulcer of left ankle with fat layer exposed: Secondary | ICD-10-CM

## 2021-12-07 DIAGNOSIS — I83893 Varicose veins of bilateral lower extremities with other complications: Secondary | ICD-10-CM

## 2021-12-07 DIAGNOSIS — M79642 Pain in left hand: Secondary | ICD-10-CM

## 2021-12-07 DIAGNOSIS — M79641 Pain in right hand: Secondary | ICD-10-CM

## 2021-12-07 DIAGNOSIS — Z8679 Personal history of other diseases of the circulatory system: Secondary | ICD-10-CM | POA: Diagnosis not present

## 2021-12-07 DIAGNOSIS — Z79899 Other long term (current) drug therapy: Secondary | ICD-10-CM

## 2021-12-07 DIAGNOSIS — Z8639 Personal history of other endocrine, nutritional and metabolic disease: Secondary | ICD-10-CM

## 2021-12-07 DIAGNOSIS — R600 Localized edema: Secondary | ICD-10-CM

## 2021-12-07 DIAGNOSIS — Z8669 Personal history of other diseases of the nervous system and sense organs: Secondary | ICD-10-CM

## 2021-12-07 DIAGNOSIS — M0609 Rheumatoid arthritis without rheumatoid factor, multiple sites: Secondary | ICD-10-CM | POA: Diagnosis not present

## 2021-12-07 DIAGNOSIS — Z96653 Presence of artificial knee joint, bilateral: Secondary | ICD-10-CM | POA: Diagnosis not present

## 2021-12-07 DIAGNOSIS — M5030 Other cervical disc degeneration, unspecified cervical region: Secondary | ICD-10-CM

## 2021-12-07 DIAGNOSIS — Z853 Personal history of malignant neoplasm of breast: Secondary | ICD-10-CM

## 2021-12-07 NOTE — Progress Notes (Signed)
I cleaned the wound today.  She states that she is taking doxycycline as directed and she is responding well.I applied a maxsorb dressing along the wound followed by Kerlix, Coflex.  We will see her back on Friday for dressing change. Advised elevation and monitor closely for any signs or symptoms of worsening infection. ? ?Patient denies nausea, vomiting, fever and chills at this time.  ?

## 2021-12-07 NOTE — Patient Instructions (Signed)
Vaccines You are taking a medication(s) that can suppress your immune system.  The following immunizations are recommended: Flu annually Covid-19  Td/Tdap (tetanus, diphtheria, pertussis) every 10 years Pneumonia (Prevnar 15 then Pneumovax 23 at least 1 year apart.  Alternatively, can take Prevnar 20 without needing additional dose) Shingrix: 2 doses from 4 weeks to 6 months apart  Please check with your PCP to make sure you are up to date.  

## 2021-12-08 LAB — CBC WITH DIFFERENTIAL/PLATELET
Absolute Monocytes: 705 cells/uL (ref 200–950)
Basophils Absolute: 23 cells/uL (ref 0–200)
Basophils Relative: 0.3 %
Eosinophils Absolute: 83 cells/uL (ref 15–500)
Eosinophils Relative: 1.1 %
HCT: 40.1 % (ref 35.0–45.0)
Hemoglobin: 13.1 g/dL (ref 11.7–15.5)
Lymphs Abs: 3060 cells/uL (ref 850–3900)
MCH: 29.8 pg (ref 27.0–33.0)
MCHC: 32.7 g/dL (ref 32.0–36.0)
MCV: 91.1 fL (ref 80.0–100.0)
MPV: 10.8 fL (ref 7.5–12.5)
Monocytes Relative: 9.4 %
Neutro Abs: 3630 cells/uL (ref 1500–7800)
Neutrophils Relative %: 48.4 %
Platelets: 208 10*3/uL (ref 140–400)
RBC: 4.4 10*6/uL (ref 3.80–5.10)
RDW: 13.3 % (ref 11.0–15.0)
Total Lymphocyte: 40.8 %
WBC: 7.5 10*3/uL (ref 3.8–10.8)

## 2021-12-08 LAB — COMPLETE METABOLIC PANEL WITH GFR
AG Ratio: 2 (calc) (ref 1.0–2.5)
ALT: 18 U/L (ref 6–29)
AST: 20 U/L (ref 10–35)
Albumin: 4.5 g/dL (ref 3.6–5.1)
Alkaline phosphatase (APISO): 35 U/L — ABNORMAL LOW (ref 37–153)
BUN/Creatinine Ratio: 34 (calc) — ABNORMAL HIGH (ref 6–22)
BUN: 18 mg/dL (ref 7–25)
CO2: 29 mmol/L (ref 20–32)
Calcium: 9.4 mg/dL (ref 8.6–10.4)
Chloride: 101 mmol/L (ref 98–110)
Creat: 0.53 mg/dL — ABNORMAL LOW (ref 0.60–1.00)
Globulin: 2.3 g/dL (calc) (ref 1.9–3.7)
Glucose, Bld: 137 mg/dL — ABNORMAL HIGH (ref 65–99)
Potassium: 3.9 mmol/L (ref 3.5–5.3)
Sodium: 140 mmol/L (ref 135–146)
Total Bilirubin: 0.5 mg/dL (ref 0.2–1.2)
Total Protein: 6.8 g/dL (ref 6.1–8.1)
eGFR: 96 mL/min/{1.73_m2} (ref >=60)

## 2021-12-10 ENCOUNTER — Ambulatory Visit (INDEPENDENT_AMBULATORY_CARE_PROVIDER_SITE_OTHER): Payer: Medicare HMO | Admitting: *Deleted

## 2021-12-10 DIAGNOSIS — L97322 Non-pressure chronic ulcer of left ankle with fat layer exposed: Secondary | ICD-10-CM

## 2021-12-10 NOTE — Progress Notes (Signed)
Patient presents today for dressing change of venous stasis ulcer medial left leg.   Upon evaluation, she does have another small wound anterior ankle left. I applied maxsorb to both areas with a DSD, kerlix and coflex to cover.   She will keep dressings dry and intact and advised elevation and monitor closely for any signs or symptoms of worsening infection. Patient denies nausea, vomiting, fever and chills at this time.   She states she comes in twice a week for dressing changes, however, she would like to follow up with Dr. Jacqualyn Posey at some point next week. There was not an opening on the nurse schedule for next Tuesday, so she was added to Dr. Leigh Aurora schedule instead.

## 2021-12-14 ENCOUNTER — Ambulatory Visit (INDEPENDENT_AMBULATORY_CARE_PROVIDER_SITE_OTHER): Payer: Medicare HMO | Admitting: Podiatry

## 2021-12-14 DIAGNOSIS — L97322 Non-pressure chronic ulcer of left ankle with fat layer exposed: Secondary | ICD-10-CM

## 2021-12-14 DIAGNOSIS — I739 Peripheral vascular disease, unspecified: Secondary | ICD-10-CM

## 2021-12-17 ENCOUNTER — Ambulatory Visit (INDEPENDENT_AMBULATORY_CARE_PROVIDER_SITE_OTHER): Payer: Medicare HMO | Admitting: Podiatry

## 2021-12-17 DIAGNOSIS — I83023 Varicose veins of left lower extremity with ulcer of ankle: Secondary | ICD-10-CM | POA: Diagnosis not present

## 2021-12-17 DIAGNOSIS — L97329 Non-pressure chronic ulcer of left ankle with unspecified severity: Secondary | ICD-10-CM | POA: Diagnosis not present

## 2021-12-20 NOTE — Progress Notes (Signed)
Subjective: 76 year old female presents the office today for follow-up evaluation of a wound on the left leg which has been chronic.  She presents today for dressing change.  She states that she has no new concerns.  No fevers or chills.    Objective: AAO x3, NAD DP/PT pulses palpable bilateral, CRT less than 3 seconds On the medial aspect of the left ankle/calf superficial area skin breakdown.  There are 2 areas that are still open and small but there is no probing, no tunneling.  There is no fluctuance or crepitation is noted.  No signs of cellulitis or abscess. No pain with calf compression, swelling, warmth, erythema  Assessment: Chronic ulceration medial ankle; venous insufficiency  Plan: -All treatment options discussed with the patient including all alternatives, risks, complications.  -She previously seen vascular surgery as well as [facility] and she is continue to have problems with wound healing on her left leg.  She may benefit from follow back up with [facility].  -I cleaned the wound today.  Prisma was applied followed by 4 x 4, Kerlix, Coflex.  Encouraged elevation -Monitor for any clinical signs or symptoms of infection and directed to call the office immediately should any occur or go to the ER.  RTC next week for dressing change  Trula Slade DPM

## 2021-12-21 ENCOUNTER — Ambulatory Visit (INDEPENDENT_AMBULATORY_CARE_PROVIDER_SITE_OTHER): Payer: Medicare HMO

## 2021-12-21 ENCOUNTER — Other Ambulatory Visit: Payer: Self-pay | Admitting: Internal Medicine

## 2021-12-21 DIAGNOSIS — I83023 Varicose veins of left lower extremity with ulcer of ankle: Secondary | ICD-10-CM

## 2021-12-21 DIAGNOSIS — L97329 Non-pressure chronic ulcer of left ankle with unspecified severity: Secondary | ICD-10-CM

## 2021-12-21 DIAGNOSIS — Z1231 Encounter for screening mammogram for malignant neoplasm of breast: Secondary | ICD-10-CM

## 2021-12-21 NOTE — Progress Notes (Unsigned)
Patient presents today for dressing change of venous stasis ulcer medial left leg.   Upon evaluation, I applied maxsorb to area with prism, kerlix and coflex to cover.   She will keep dressings dry and intact and advised elevation and monitor closely for any signs or symptoms of worsening infection. Patient denies nausea, vomiting, fever and chills at this time.   Pt has an appointment with Dr. Jacqualyn Posey on Friday December 24, 2021.

## 2021-12-22 NOTE — Progress Notes (Signed)
Subjective: 76 year old female presents the office today for follow-up evaluation of a wound on the left leg which has been chronic.  She finished the course of doxycycline and this has been doing better.  She has no new concerns today.  No fevers or chills.  Objective: AAO x3, NAD DP/PT pulses palpable bilateral, CRT less than 3 seconds On the medial aspect of the left ankle/calf multiple superficial area skin breakdown.  They appear to be filling in.  There do not areas that appear to be somewhat deeper but is still superficial without any probing to bone or tunneling.  The erythema appears to be resolved.  There is no fluctuation or crepitation.  There is no malodor. No pain with calf compression, swelling, warmth, erythema   Assessment: Chronic ulceration medial ankle; venous insufficiency  Plan: -All treatment options discussed with the patient including all alternatives, risks, complications.  -She previously seen vascular surgery as well as [facility] and she is continue to have problems with wound healing on her left leg.  She may benefit from follow back up with [facility].  -I cleaned the wound today.  She explained well to the doxycycline.  We applied Prisma today followed by Kerlix, Coflex from the toes to the knees.  Encouraged elevation. -Monitor for any clinical signs or symptoms of infection and directed to call the office immediately should any occur or go to the ER.  No follow-ups on file.  Trula Slade DPM

## 2021-12-24 ENCOUNTER — Ambulatory Visit (INDEPENDENT_AMBULATORY_CARE_PROVIDER_SITE_OTHER): Payer: Medicare HMO | Admitting: *Deleted

## 2021-12-24 DIAGNOSIS — Z79899 Other long term (current) drug therapy: Secondary | ICD-10-CM | POA: Diagnosis not present

## 2021-12-24 DIAGNOSIS — E1140 Type 2 diabetes mellitus with diabetic neuropathy, unspecified: Secondary | ICD-10-CM | POA: Diagnosis not present

## 2021-12-24 DIAGNOSIS — I83023 Varicose veins of left lower extremity with ulcer of ankle: Secondary | ICD-10-CM

## 2021-12-24 DIAGNOSIS — L97329 Non-pressure chronic ulcer of left ankle with unspecified severity: Secondary | ICD-10-CM

## 2021-12-24 DIAGNOSIS — M47812 Spondylosis without myelopathy or radiculopathy, cervical region: Secondary | ICD-10-CM | POA: Diagnosis not present

## 2021-12-24 DIAGNOSIS — M47816 Spondylosis without myelopathy or radiculopathy, lumbar region: Secondary | ICD-10-CM | POA: Diagnosis not present

## 2021-12-24 DIAGNOSIS — M47894 Other spondylosis, thoracic region: Secondary | ICD-10-CM | POA: Diagnosis not present

## 2021-12-24 MED ORDER — DOXYCYCLINE HYCLATE 100 MG PO TABS: 100.0000 mg | ORAL_TABLET | Freq: Two times a day (BID) | ORAL | 0 refills | Status: DC

## 2021-12-24 NOTE — Progress Notes (Signed)
Patient presents today for dressing change of venous stasis ulcer medial left leg.   Patient reports that the ulcer looks worse today than it did on Tuesday. Dr. Jacqualyn Posey examined patient and recommended another round of Doxycycline and Unna boot application today.  Doxycycline was sent to [facility] in [place] and [employer] was applied.  She will keep dressings dry and intact until her next nurse visit on Tuesday June 6,2023.   Advised to call immediately with any worsening pain or signs of infection.

## 2021-12-28 ENCOUNTER — Ambulatory Visit (INDEPENDENT_AMBULATORY_CARE_PROVIDER_SITE_OTHER): Payer: Medicare HMO

## 2021-12-28 DIAGNOSIS — R2242 Localized swelling, mass and lump, left lower limb: Secondary | ICD-10-CM

## 2021-12-28 NOTE — Progress Notes (Signed)
Patient present today for dressing change of venous stasis ulcer medial left leg.   Dressing changed without complication. Maxorb applied to left lower extremity medial and lateral aspects followed by an unna boot.  Patient tolerated dressing change well.   Advised patient to call the office with any signs or symptoms of infection or with any questions, comments or concerns. Patient verbalized understanding.

## 2021-12-31 ENCOUNTER — Ambulatory Visit (INDEPENDENT_AMBULATORY_CARE_PROVIDER_SITE_OTHER): Payer: Medicare HMO

## 2021-12-31 DIAGNOSIS — R2242 Localized swelling, mass and lump, left lower limb: Secondary | ICD-10-CM

## 2021-12-31 NOTE — Progress Notes (Signed)
Patient present today for dressing change of venous stasis ulcer medial and lateral left leg.   Dressing changed without complication. Maxorb applied to left lower extremity medial and lateral aspects followed by an unna boot.  Patient tolerated dressing change well.   Advised patient to call the office with any signs or symptoms of infection or with any questions, comments or concerns. Patient verbalized understanding.

## 2022-01-03 ENCOUNTER — Ambulatory Visit
Admission: RE | Admit: 2022-01-03 | Discharge: 2022-01-03 | Disposition: A | Discharge status: Home / Self Care | Payer: Medicare HMO | Source: Ambulatory Visit | Attending: Internal Medicine | Admitting: Internal Medicine

## 2022-01-03 DIAGNOSIS — Z1231 Encounter for screening mammogram for malignant neoplasm of breast: Secondary | ICD-10-CM

## 2022-01-04 ENCOUNTER — Ambulatory Visit (INDEPENDENT_AMBULATORY_CARE_PROVIDER_SITE_OTHER): Payer: Medicare HMO | Admitting: Podiatry

## 2022-01-04 DIAGNOSIS — I872 Venous insufficiency (chronic) (peripheral): Secondary | ICD-10-CM | POA: Diagnosis not present

## 2022-01-04 DIAGNOSIS — L97929 Non-pressure chronic ulcer of unspecified part of left lower leg with unspecified severity: Secondary | ICD-10-CM | POA: Diagnosis not present

## 2022-01-04 DIAGNOSIS — L97322 Non-pressure chronic ulcer of left ankle with fat layer exposed: Secondary | ICD-10-CM | POA: Diagnosis not present

## 2022-01-04 DIAGNOSIS — L929 Granulomatous disorder of the skin and subcutaneous tissue, unspecified: Secondary | ICD-10-CM | POA: Diagnosis not present

## 2022-01-04 DIAGNOSIS — R238 Other skin changes: Secondary | ICD-10-CM | POA: Diagnosis not present

## 2022-01-06 DIAGNOSIS — L918 Other hypertrophic disorders of the skin: Secondary | ICD-10-CM | POA: Diagnosis not present

## 2022-01-06 DIAGNOSIS — D1801 Hemangioma of skin and subcutaneous tissue: Secondary | ICD-10-CM | POA: Diagnosis not present

## 2022-01-06 DIAGNOSIS — B078 Other viral warts: Secondary | ICD-10-CM | POA: Diagnosis not present

## 2022-01-06 DIAGNOSIS — L820 Inflamed seborrheic keratosis: Secondary | ICD-10-CM | POA: Diagnosis not present

## 2022-01-06 DIAGNOSIS — L570 Actinic keratosis: Secondary | ICD-10-CM | POA: Diagnosis not present

## 2022-01-06 DIAGNOSIS — M793 Panniculitis, unspecified: Secondary | ICD-10-CM | POA: Diagnosis not present

## 2022-01-06 DIAGNOSIS — L821 Other seborrheic keratosis: Secondary | ICD-10-CM | POA: Diagnosis not present

## 2022-01-07 ENCOUNTER — Other Ambulatory Visit: Payer: Medicare HMO

## 2022-01-11 ENCOUNTER — Ambulatory Visit: Payer: Medicare HMO | Admitting: Podiatry

## 2022-01-11 DIAGNOSIS — I872 Venous insufficiency (chronic) (peripheral): Secondary | ICD-10-CM

## 2022-01-11 DIAGNOSIS — L97322 Non-pressure chronic ulcer of left ankle with fat layer exposed: Secondary | ICD-10-CM

## 2022-01-12 ENCOUNTER — Ambulatory Visit: Payer: Medicare HMO | Admitting: Orthopedic Surgery

## 2022-01-12 ENCOUNTER — Encounter: Payer: Self-pay | Admitting: Orthopedic Surgery

## 2022-01-12 ENCOUNTER — Telehealth: Payer: Self-pay | Admitting: *Deleted

## 2022-01-12 ENCOUNTER — Ambulatory Visit: Payer: Self-pay

## 2022-01-12 DIAGNOSIS — M19012 Primary osteoarthritis, left shoulder: Secondary | ICD-10-CM

## 2022-01-12 MED ORDER — LIDOCAINE HCL 1 % IJ SOLN
3.0000 mL | INTRAMUSCULAR | Status: AC | PRN
  Administered 2022-01-12: 3 mL

## 2022-01-12 MED ORDER — BUPIVACAINE HCL 0.25 % IJ SOLN
0.6600 mL | INTRAMUSCULAR | Status: AC | PRN
  Administered 2022-01-12: .66 mL via INTRA_ARTICULAR

## 2022-01-12 MED ORDER — METHYLPREDNISOLONE ACETATE 40 MG/ML IJ SUSP
13.3300 mg | INTRAMUSCULAR | Status: AC | PRN
  Administered 2022-01-12: 13.33 mg via INTRA_ARTICULAR

## 2022-01-12 NOTE — Telephone Encounter (Signed)
Called and spoke with the patient giving [place] lab results per physician, is an ulcer, no malignancy. Is to continue with current treatments. She verbalized understanding.

## 2022-01-12 NOTE — Progress Notes (Signed)
Subjective: 76 year old female presents the office today for follow-up evaluation of a wound on the left leg which has been chronic.  She said her daughter's been doing some research online and is asking for other medical conditions that could be causing the wound.  She states that she has no new concerns.  No fevers or chills.    Objective: AAO x3, NAD DP/PT pulses palpable bilateral, CRT less than 3 seconds On the medial aspect of the left ankle/calf superficial area skin breakdown.  There are 2 areas that are still open and small but there is no probing, no tunneling.  There is no fluctuance or crepitation is noted.  No signs of cellulitis or abscess. No pain with calf compression, swelling, warmth, erythema  Assessment: Chronic ulceration medial ankle; venous insufficiency  Plan: -All treatment options discussed with the patient including all alternatives, risks, complications.  -Given the chronic ongoing nature of the wound we discussed biopsy.  Discussed pros and cons of doing this.  Although it appears to be a venous wound would like to rule out other underlying pathology which could be a cause.  Consent was signed.  Skin was cleaned with alcohol and mixture of 1 cc lidocaine with epinephrine was infiltrated subdermally underneath one of the skin lesions.  Once anesthetized the skin was cleaned with Betadine.  I then utilized the 3 mm punch biopsy in order to take a wedge of tissue from the wound as well as a small piece of surrounding skin.  Tissue was very hard and appear to be calcified.  Area was irrigated with alcohol and hemostasis achieved.  Antibiotic ointment and a bandage applied.  Postprocedure instructions discussed.   -For the venous insufficiency we did discuss referral to a different vein specialist.  Ultimately we will wait for the biopsy results before proceeding with this.   Discussed washing with soap and water and apply a bandage daily.  Trula Slade DPM

## 2022-01-12 NOTE — Progress Notes (Signed)
Office Visit Note   Patient: [name]           Date of Birth: Jan 02, 1946           MRN: [id number] Visit Date: 01/12/2022 Requested by: Crist Infante, MD [address] [place],  [place] [address] PCP: Crist Infante, MD  Subjective: Chief Complaint  Patient presents with   Left Shoulder - Pain    HPI: [name] is a 76 y.o. female who presents to the office complaining of left arm pain.  Patient complains of shoulder pain that she localizes to the superior aspect of the shoulder with radiation to the elbow.  She complains of ear pain that is worse at night and wakes her up from sleep when she tries to lay on her left arm.  She has no history of prior injury.  She does have history of right-sided rotator cuff arthropathy.  She is right-hand dominant.  No history of prior left shoulder surgery.  Pains been getting worse over the last several months.  Denies any scapular pain.  Occasional neck pain.  No numbness or tingling.  She was last seen in July 2021 and had subacromial injection that provided minimal relief..                ROS: All systems reviewed are negative as they relate to the chief complaint within the history of present illness.  Patient denies fevers or chills.  Assessment & Plan: Visit Diagnoses:  1. Arthritis of left acromioclavicular joint     Plan: Patient is a 76 year old female who presents for evaluation of left shoulder pain.  Mostly localized to the [facility] joint today.  Prior subacromial injection has not been very helpful for her.  After discussion of options, she would like to try left acromioclavicular joint injection.  AC joint was identified with ultrasound and was found to be very arthritic.  Cortisone injection was successfully administered into the [facility] joint.  Patient tolerated the procedure well.  She will call the office in 6 weeks with how she is doing and the next step would be to have MRI of left shoulder.  Follow-Up Instructions: Return if symptoms  worsen or fail to improve.   Orders:  Orders Placed This Encounter  Procedures   US Guided Needle Placement - No Linked Charges   No orders of the defined types were placed in this encounter.     Procedures: Medium Joint Inj: L acromioclavicular on 01/12/2022 12:45 PM Indications: diagnostic evaluation and pain Details: 25 G 1.5 in needle, ultrasound-guided superior approach Medications: 3 mL lidocaine 1 %; 0.66 mL bupivacaine 0.25 %; 13.33 mg methylPREDNISolone acetate 40 MG/ML Outcome: tolerated well, no immediate complications Procedure, treatment alternatives, risks and benefits explained, specific risks discussed. Consent was given by the patient. Immediately prior to procedure a time out was called to verify the correct patient, procedure, equipment, support staff and site/side marked as required. Patient was prepped and draped in the usual sterile fashion.       Clinical Data: No additional findings.  Objective: Vital Signs: There were no vitals taken for this visit.  Physical Exam:  Constitutional: Patient appears well-developed HEENT:  Head: Normocephalic Eyes:EOM are normal Neck: Normal range of motion Cardiovascular: Normal rate Pulmonary/chest: Effort normal Neurologic: Patient is alert Skin: Skin is warm Psychiatric: Patient has normal mood and affect  Ortho Exam: Ortho exam demonstrates left shoulder with 45 degrees X rotation, 100 degrees abduction, 150 degrees forward flexion.  No  weakness with supra, infra, subscap.  No coarse grinding crepitus noted in the region of the rotator cuff.  He does have some limited sensation that is noted in the [facility] joint.  She has moderate to severe tenderness over the [facility] joint that is asymmetric compared with contralateral side.  Pain is worse with crossarm adduction.  5/5 motor strength of bilateral grip strength, finger abduction, pronation/supination, bicep, tricep, deltoid.  Specialty Comments:  No specialty comments  available.  Imaging: US Guided Needle Placement - No Linked Charges  Result Date: 01/12/2022 Ultrasound imaging demonstrates needle placement into the [facility] joint with extravasation of fluid with no  complicating features    PMFS History: Patient Active Problem List   Diagnosis Date Noted   Trigger finger, left middle finger 10/26/2021   Trigger finger, right ring finger 10/26/2021   Trigger finger, right middle finger 10/26/2021   Benign neoplasm of ascending colon    Hx of adenomatous colonic polyps    Occult blood in stools    Unstable angina ([place]) 10/06/2020   Acute on chronic respiratory failure with hypoxia ([place]) 02/28/2019   OSA on CPAP 02/28/2019   Diabetes mellitus type 2, uncontrolled, with complications 33/29/5188   Breast cancer ([place]) 02/27/2019   History of cancer chemotherapy 02/27/2019   History of radiation therapy 02/27/2019   Diabetic neuropathy ([place]) 02/27/2019   HLD (hyperlipidemia) 02/27/2019   Essential hypertension 02/27/2019   QT prolongation 02/27/2019   Pneumonia due to COVID-19 virus 02/24/2019   Diarrhea    Benign neoplasm of cecum    Abdominal pain, epigastric    Gastroesophageal reflux disease    Dependence on nocturnal oxygen therapy 07/10/2017   Open wound of left lower leg 12/26/2016   Vitamin D deficiency 09/14/2016   History of total knee replacement, bilateral 09/14/2016   DJD (degenerative joint disease), cervical 09/14/2016   Spondylosis of lumbar region without myelopathy or radiculopathy 09/14/2016   High risk medication use 05/20/2016   Spinal stenosis, lumbar region, with neurogenic claudication 09/08/2015    Class: Chronic   OSA (obstructive sleep apnea) 10/17/2013   Dyspnea 09/04/2013   Atypical chest pain 09/04/2013   Morbid obesity ([place]) 03/21/2012   Cancer of lower-outer quadrant of female breast ([place]) 03/15/2012   Diabetes mellitus, type II ([place]) 02/22/2011   Coronary artery disease    Hypertension    Hyperlipidemia    OA  (osteoarthritis) of knee    Past Medical History:  Diagnosis Date   Actinic keratosis    Anxiety    Asthma    Albuterol inhaler as needed.Pulmicort neb as needed   Breast cancer ([place])    right - lumpectomy    Chronic back pain    spinal stenosis   Coronary artery disease    COVID    uses o2 since at hs   Dependence on nocturnal oxygen therapy 07/10/2017   Pt uses 2 liters 02 at night    Depression    takes CYmbalta and Wellbutrin daily   Diabetes mellitus    not on any meds/controlled by diet   Dyspnea    with exertion occasionally when lies down   GERD (gastroesophageal reflux disease)    takes Omeprazole daily   Headache    oocasionally   History of kidney stones    Hyperlipidemia    not on any meds   Hypertension    takes Benazepril,Bystolic,and Amlodipine daily   IBS (irritable bowel syndrome)    Joint pain    Joint swelling  Livedoid vasculitis    Nocturia    OA (osteoarthritis) of knee    Other seborrheic keratosis    Oxygen deficiency    2 liters at night per pt for OSA- no cpap    Peripheral edema    takes daily as needed   Peripheral neuropathy    Personal history of chemotherapy    Personal history of radiation therapy    Pneumonia    hx of-several yrs ago   RA (rheumatoid arthritis) (HCC)    Urinary frequency    Urinary urgency    Weakness    numbness and tingling mainly in left leg occasionally in right.Tingling/numbness in hands    Family History  Problem Relation Age of Onset   Heart disease Mother    Hyperlipidemia Mother    Lung cancer Father    Diabetes Father    Hyperlipidemia Father    Heart disease Sister    Hypertension Sister    Hyperlipidemia Sister    Heart disease Brother    Diabetes Brother    Hypertension Brother    Hyperlipidemia Brother    Lung cancer Brother    Heart disease Brother    Hypertension Brother    Hyperlipidemia Brother    Heart disease Brother    Hypertension Brother    Hyperlipidemia Brother     Heart disease Maternal Aunt    Asthma Maternal Uncle    Heart disease Maternal Uncle     Past Surgical History:  Procedure Laterality Date   BIOPSY  06/24/2021   Procedure: BIOPSY;  Surgeon: Ladene Artist, MD;  Location: [facility];  Service: Endoscopy;;   BREAST LUMPECTOMY  1997   RIGHT BREAST   CARDIAC CATHETERIZATION  04/23/2004   EF 60%   cataract surgery Bilateral    COLONOSCOPY     COLONOSCOPY WITH PROPOFOL N/A 08/07/2017   Procedure: COLONOSCOPY WITH PROPOFOL;  Surgeon: Ladene Artist, MD;  Location: [facility];  Service: Endoscopy;  Laterality: N/A;   COLONOSCOPY WITH PROPOFOL N/A 06/24/2021   Procedure: COLONOSCOPY WITH PROPOFOL;  Surgeon: Ladene Artist, MD;  Location: [facility];  Service: Endoscopy;  Laterality: N/A;   CORONARY ARTERY BYPASS GRAFT  2005   LIMA GRAFT TO LAD, SAPHENOUS VEIN GRAFT TO THE FIRST DIAGONAL, AND LEFT RADIAL ARTERY GRAFT TO THE OM   ESOPHAGOGASTRODUODENOSCOPY (EGD) WITH PROPOFOL N/A 08/07/2017   Procedure: ESOPHAGOGASTRODUODENOSCOPY (EGD) WITH PROPOFOL;  Surgeon: Ladene Artist, MD;  Location: [facility];  Service: Endoscopy;  Laterality: N/A;   FOOT SURGERY Right    JOINT REPLACEMENT     LITHOTRIPSY     LUMBAR LAMINECTOMY/DECOMPRESSION MICRODISCECTOMY N/A 09/08/2015   Procedure: CENTRAL DECOMPRESSIVE LUMBAR LAMINECTOMIES L3-4, L4-5 AND BILATERAL HEMILAMINECTOMY L5-S1;  Surgeon: Jessy Oto, MD;  Location: [facility];  Service: Orthopedics;  Laterality: N/A;   nodule removed from left elbow     POLYPECTOMY  06/24/2021   Procedure: POLYPECTOMY;  Surgeon: Ladene Artist, MD;  [facility]: [facility];  Service: Endoscopy;;   RIGHT/LEFT HEART CATH AND CORONARY/GRAFT ANGIOGRAPHY N/A 10/06/2020   Procedure: RIGHT/LEFT HEART CATH AND CORONARY/GRAFT ANGIOGRAPHY;  Surgeon: Martinique, Peter M, MD;  Location: [facility];  Service: Cardiovascular;  Laterality: N/A;   SHOULDER ARTHROSCOPY     TOTAL KNEE ARTHROPLASTY Bilateral    TOTAL SHOULDER  ARTHROPLASTY     TRIPLE BYPASS  04/27/05   US ECHOCARDIOGRAPHY  01/06/2009   EF 55-60%   WRIST SURGERY Left    Social History   Occupational  History   Occupation: retired    Fish farm manager: UNEMPLOYED  Tobacco Use   Smoking status: Never    Passive exposure: Never   Smokeless tobacco: Never  Vaping Use   Vaping Use: Never used  Substance and Sexual Activity   Alcohol use: No    Alcohol/week: 0.0 standard drinks of alcohol   Drug use: Never   Sexual activity: Not on file

## 2022-01-13 ENCOUNTER — Other Ambulatory Visit: Payer: Self-pay | Admitting: Cardiology

## 2022-01-14 ENCOUNTER — Ambulatory Visit (INDEPENDENT_AMBULATORY_CARE_PROVIDER_SITE_OTHER): Payer: Medicare HMO

## 2022-01-14 DIAGNOSIS — L97322 Non-pressure chronic ulcer of left ankle with fat layer exposed: Secondary | ICD-10-CM

## 2022-01-17 NOTE — Progress Notes (Signed)
Subjective: 76 year old female presents the office today for follow-up evaluation of a wound on the left leg which has been chronic and for biopsy follow-up.  She is continue with daily dressing changes.  No drainage or pus.  No increased swelling or redness or any warmth.  No fevers or chills.   Objective: AAO x3, NAD DP/PT pulses palpable bilateral, CRT less than 3 seconds On the medial aspect of the left ankle/calf superficial area skin breakdown.  There is a biopsy appears to be healing well Small superficial granular wound still present there is no drainage or pus.  No surrounding cellulitis or fluctuation or crepitation but there is no odor. No pain with calf compression, swelling, warmth, erythema  Assessment: Chronic ulceration medial ankle; venous insufficiency  Plan: -All treatment options discussed with the patient including all alternatives, risks, complications.  -Biopsy site appears to be healing well.  Antibiotic ointment and dressing applied.  Continue with daily dressing changes as well as compression and elevation.  We had previously discussed referral to vein specialist however we will await the results of the biopsy before this. -Monitor for any clinical signs or symptoms of infection and directed to call the office immediately should any occur or go to the ER.  No follow-ups on file.  Vivi Barrack DPM

## 2022-01-18 ENCOUNTER — Ambulatory Visit: Payer: Medicare HMO

## 2022-01-18 DIAGNOSIS — I872 Venous insufficiency (chronic) (peripheral): Secondary | ICD-10-CM

## 2022-01-18 MED ORDER — DOXYCYCLINE HYCLATE 100 MG PO TABS: 100.0000 mg | ORAL_TABLET | Freq: Two times a day (BID) | ORAL | 0 refills | Status: DC

## 2022-01-18 NOTE — Progress Notes (Signed)
Patient present today for dressing change of venous stasis ulcer medial and lateral left leg.   Dressing changed without complication. Adaptic applied to left lower extremity medial and lateral aspects followed by sterile dressing and ace wrap.   Patient tolerated dressing change well.   Advised patient to call the office with any signs or symptoms of infection or with any questions, comments or concerns. Patient verbalized understanding.    Pt will follow up Friday for dressing change.

## 2022-01-21 ENCOUNTER — Ambulatory Visit (INDEPENDENT_AMBULATORY_CARE_PROVIDER_SITE_OTHER): Payer: Medicare HMO

## 2022-01-21 ENCOUNTER — Encounter: Payer: Self-pay | Admitting: Cardiology

## 2022-01-21 DIAGNOSIS — L97329 Non-pressure chronic ulcer of left ankle with unspecified severity: Secondary | ICD-10-CM

## 2022-01-21 DIAGNOSIS — R2242 Localized swelling, mass and lump, left lower limb: Secondary | ICD-10-CM

## 2022-01-21 DIAGNOSIS — I83023 Varicose veins of left lower extremity with ulcer of ankle: Secondary | ICD-10-CM

## 2022-01-21 NOTE — Progress Notes (Signed)
Patient present today for dressing change of venous stasis ulcer medial and lateral left leg.   Dressing changed without complication. Adaptic applied to left lower extremity medial and lateral aspects followed by sterile dressing and ace wrap.   Patient tolerated dressing change well.   Advised patient to call the office with any signs or symptoms of infection or with any questions, comments or concerns. Patient verbalized understanding.    Pt will follow up Wednesday 01/26/22 for dressing change.

## 2022-01-26 ENCOUNTER — Ambulatory Visit (INDEPENDENT_AMBULATORY_CARE_PROVIDER_SITE_OTHER): Payer: Medicare HMO

## 2022-01-26 DIAGNOSIS — M069 Rheumatoid arthritis, unspecified: Secondary | ICD-10-CM | POA: Diagnosis not present

## 2022-01-26 DIAGNOSIS — M47812 Spondylosis without myelopathy or radiculopathy, cervical region: Secondary | ICD-10-CM | POA: Diagnosis not present

## 2022-01-26 DIAGNOSIS — L97329 Non-pressure chronic ulcer of left ankle with unspecified severity: Secondary | ICD-10-CM

## 2022-01-26 DIAGNOSIS — E1140 Type 2 diabetes mellitus with diabetic neuropathy, unspecified: Secondary | ICD-10-CM | POA: Diagnosis not present

## 2022-01-26 DIAGNOSIS — E041 Nontoxic single thyroid nodule: Secondary | ICD-10-CM | POA: Diagnosis not present

## 2022-01-26 DIAGNOSIS — E785 Hyperlipidemia, unspecified: Secondary | ICD-10-CM | POA: Diagnosis not present

## 2022-01-26 DIAGNOSIS — I10 Essential (primary) hypertension: Secondary | ICD-10-CM | POA: Diagnosis not present

## 2022-01-26 DIAGNOSIS — I83023 Varicose veins of left lower extremity with ulcer of ankle: Secondary | ICD-10-CM

## 2022-01-26 DIAGNOSIS — Z79899 Other long term (current) drug therapy: Secondary | ICD-10-CM | POA: Diagnosis not present

## 2022-01-26 DIAGNOSIS — M47894 Other spondylosis, thoracic region: Secondary | ICD-10-CM | POA: Diagnosis not present

## 2022-01-26 DIAGNOSIS — E1151 Type 2 diabetes mellitus with diabetic peripheral angiopathy without gangrene: Secondary | ICD-10-CM | POA: Diagnosis not present

## 2022-01-26 DIAGNOSIS — M47816 Spondylosis without myelopathy or radiculopathy, lumbar region: Secondary | ICD-10-CM | POA: Diagnosis not present

## 2022-01-26 NOTE — Progress Notes (Signed)
Patient present today for dressing change of venous stasis ulcer medial and lateral left leg.   Dressing changed without complication. Maxsorb applied to left lower extremity medial and lateral aspects followed by sterile dressing and ace wrap.   Patient tolerated dressing change well.   Advised patient to call the office with any signs or symptoms of infection or with any questions, comments or concerns. Patient verbalized understanding.    Pt will follow up Friday 01/28/22 for dressing change.

## 2022-01-28 ENCOUNTER — Ambulatory Visit (INDEPENDENT_AMBULATORY_CARE_PROVIDER_SITE_OTHER): Payer: Medicare HMO | Admitting: Podiatry

## 2022-01-28 DIAGNOSIS — I83023 Varicose veins of left lower extremity with ulcer of ankle: Secondary | ICD-10-CM

## 2022-01-28 DIAGNOSIS — L97329 Non-pressure chronic ulcer of left ankle with unspecified severity: Secondary | ICD-10-CM

## 2022-01-28 NOTE — Progress Notes (Signed)
Patient present today for dressing change of venous stasis ulcer medial and lateral left leg.    Dressing changed without complication. Cleansed area with wound cleanser, Maxsorb applied to left lower extremity medial and lateral aspects followed by sterile dressing and ace wrap.   Patient tolerated dressing change well.    Advised patient to call the office with any signs or symptoms of infection or with any questions, comments or concerns. Patient verbalized understanding.     Pt will follow up Monday 01/31/22 for dressing change. Patient was notified that provider will come and observe ulcer on Monday.

## 2022-01-31 ENCOUNTER — Ambulatory Visit (INDEPENDENT_AMBULATORY_CARE_PROVIDER_SITE_OTHER): Payer: Medicare HMO

## 2022-01-31 DIAGNOSIS — I83023 Varicose veins of left lower extremity with ulcer of ankle: Secondary | ICD-10-CM

## 2022-01-31 DIAGNOSIS — L97329 Non-pressure chronic ulcer of left ankle with unspecified severity: Secondary | ICD-10-CM

## 2022-01-31 MED ORDER — DOXYCYCLINE HYCLATE 100 MG PO TABS: 100.0000 mg | ORAL_TABLET | Freq: Two times a day (BID) | ORAL | 0 refills | Status: DC

## 2022-01-31 NOTE — Progress Notes (Signed)
Patient present today for dressing change of venous stasis ulcer medial and lateral left leg.    Dressing changed without complication. Cleansed area with wound cleanser, Maxsorb applied to left lower extremity medial and lateral aspects followed by sterile dressing and ace wrap.   Patient tolerated dressing change well.    Advised patient to call the office with any signs or symptoms of infection or with any questions, comments or concerns. Patient verbalized understanding.

## 2022-02-01 DIAGNOSIS — Z0000 Encounter for general adult medical examination without abnormal findings: Secondary | ICD-10-CM | POA: Diagnosis not present

## 2022-02-02 ENCOUNTER — Telehealth: Payer: Self-pay | Admitting: *Deleted

## 2022-02-02 DIAGNOSIS — E041 Nontoxic single thyroid nodule: Secondary | ICD-10-CM | POA: Diagnosis not present

## 2022-02-02 DIAGNOSIS — F329 Major depressive disorder, single episode, unspecified: Secondary | ICD-10-CM | POA: Diagnosis not present

## 2022-02-02 DIAGNOSIS — K219 Gastro-esophageal reflux disease without esophagitis: Secondary | ICD-10-CM | POA: Diagnosis not present

## 2022-02-02 DIAGNOSIS — R251 Tremor, unspecified: Secondary | ICD-10-CM | POA: Diagnosis not present

## 2022-02-02 DIAGNOSIS — E1151 Type 2 diabetes mellitus with diabetic peripheral angiopathy without gangrene: Secondary | ICD-10-CM | POA: Diagnosis not present

## 2022-02-02 DIAGNOSIS — I10 Essential (primary) hypertension: Secondary | ICD-10-CM | POA: Diagnosis not present

## 2022-02-02 DIAGNOSIS — R82998 Other abnormal findings in urine: Secondary | ICD-10-CM | POA: Diagnosis not present

## 2022-02-02 DIAGNOSIS — I2510 Atherosclerotic heart disease of native coronary artery without angina pectoris: Secondary | ICD-10-CM | POA: Diagnosis not present

## 2022-02-02 DIAGNOSIS — Z0000 Encounter for general adult medical examination without abnormal findings: Secondary | ICD-10-CM | POA: Diagnosis not present

## 2022-02-02 DIAGNOSIS — Z1389 Encounter for screening for other disorder: Secondary | ICD-10-CM | POA: Diagnosis not present

## 2022-02-02 DIAGNOSIS — Z1331 Encounter for screening for depression: Secondary | ICD-10-CM | POA: Diagnosis not present

## 2022-02-02 DIAGNOSIS — E785 Hyperlipidemia, unspecified: Secondary | ICD-10-CM | POA: Diagnosis not present

## 2022-02-02 NOTE — Telephone Encounter (Signed)
Labs received from:[facility]  Drawn on:01/26/2022  Reviewed by:Hazel Sams, PA-C  Labs drawn:CMP, CBC, Lipid Panel, TSH, Free T4, Hgb A1C  Results:Glucose 160, MCH 32.7, MPV, Cholesterol 209, Triglycerides 155, Hgb A1C 7.0  Patient is on PLQ 200 mg po BID.

## 2022-02-03 ENCOUNTER — Ambulatory Visit (INDEPENDENT_AMBULATORY_CARE_PROVIDER_SITE_OTHER): Payer: Medicare HMO

## 2022-02-03 DIAGNOSIS — I83023 Varicose veins of left lower extremity with ulcer of ankle: Secondary | ICD-10-CM

## 2022-02-03 DIAGNOSIS — L97329 Non-pressure chronic ulcer of left ankle with unspecified severity: Secondary | ICD-10-CM

## 2022-02-03 NOTE — Progress Notes (Signed)
Patient present today for dressing change of venous stasis ulcer medial left leg.    Dressing changed without complication. Cleansed area with wound cleanser, Maxsorb applied to left lower extremity medial aspect followed by sterile dressing and ace wrap.   Patient tolerated dressing change well.    Advised patient to call the office with any signs or symptoms of infection or with any questions, comments or concerns. Patient verbalized understanding.     Patient reports taking Doxycycline as ordered

## 2022-02-07 ENCOUNTER — Ambulatory Visit (INDEPENDENT_AMBULATORY_CARE_PROVIDER_SITE_OTHER): Payer: Medicare HMO

## 2022-02-07 DIAGNOSIS — I83023 Varicose veins of left lower extremity with ulcer of ankle: Secondary | ICD-10-CM

## 2022-02-07 DIAGNOSIS — L97329 Non-pressure chronic ulcer of left ankle with unspecified severity: Secondary | ICD-10-CM

## 2022-02-07 NOTE — Progress Notes (Signed)
Patient present today for dressing change of venous stasis ulcer medial and lateral left leg.   Dressing changed without complication. Maxsorb applied to left lower extremity medial and lateral aspects followed by sterile dressing and ace wrap.   Patient tolerated dressing change well.   Advised patient to call the office with any signs or symptoms of infection or with any questions, comments or concerns. Patient verbalized understanding.    Pt will follow up on Thursday for dressing change.

## 2022-02-10 ENCOUNTER — Ambulatory Visit (INDEPENDENT_AMBULATORY_CARE_PROVIDER_SITE_OTHER): Payer: Medicare HMO | Admitting: Podiatry

## 2022-02-10 DIAGNOSIS — H35372 Puckering of macula, left eye: Secondary | ICD-10-CM | POA: Diagnosis not present

## 2022-02-10 DIAGNOSIS — Z79899 Other long term (current) drug therapy: Secondary | ICD-10-CM | POA: Diagnosis not present

## 2022-02-10 DIAGNOSIS — E119 Type 2 diabetes mellitus without complications: Secondary | ICD-10-CM | POA: Diagnosis not present

## 2022-02-10 DIAGNOSIS — I83023 Varicose veins of left lower extremity with ulcer of ankle: Secondary | ICD-10-CM

## 2022-02-10 DIAGNOSIS — H04123 Dry eye syndrome of bilateral lacrimal glands: Secondary | ICD-10-CM | POA: Diagnosis not present

## 2022-02-10 DIAGNOSIS — L97329 Non-pressure chronic ulcer of left ankle with unspecified severity: Secondary | ICD-10-CM

## 2022-02-10 LAB — HM DIABETES EYE EXAM

## 2022-02-10 NOTE — Progress Notes (Signed)
Patient present today for dressing change of venous stasis ulcer medial and lateral left leg.    Patient was evaluated by Dr. Jacqualyn Posey. Dressing changed without complication. Maxsorb applied to left lower extremity medial and lateral aspects followed by sterile dressing and ace wrap per providers orders.  Patient tolerated dressing change well.    Advised patient to call the office with any signs or symptoms of infection or with any questions, comments or concerns. Patient verbalized understanding.     Pt will follow up on Tuesday for dressing change.

## 2022-02-14 ENCOUNTER — Ambulatory Visit: Payer: Medicare HMO | Admitting: Podiatry

## 2022-02-14 DIAGNOSIS — L97329 Non-pressure chronic ulcer of left ankle with unspecified severity: Secondary | ICD-10-CM | POA: Diagnosis not present

## 2022-02-14 DIAGNOSIS — I83023 Varicose veins of left lower extremity with ulcer of ankle: Secondary | ICD-10-CM | POA: Diagnosis not present

## 2022-02-14 DIAGNOSIS — L03116 Cellulitis of left lower limb: Secondary | ICD-10-CM

## 2022-02-14 MED ORDER — DOXYCYCLINE HYCLATE 100 MG PO TABS: 100.0000 mg | ORAL_TABLET | Freq: Two times a day (BID) | ORAL | 0 refills | Status: DC

## 2022-02-14 NOTE — Progress Notes (Signed)
Subjective: 76 year old female presents the office today for follow-up evaluation of ongoing wound of her left foot, venous stasis.  She previously been seen by [place] vein specialist as well as vein and vascular.  She states that she previously had intervention for the venous insufficiency.  She had a wound to her left leg for quite some time that was originally treated by her primary care doctor.  Currently denies any fevers or chills.  Has been on multiple rounds of doxycyline which helps  Objective: AAO x3, NAD DP/PT pulses palpable bilaterally, CRT less than 3 seconds Chronic swelling present in the left leg.  On the medial aspect of the calf for 3 punctate superficial wounds without any probing but there is a small amount of blistering some drainage expressed.  There is localized erythema that is present which appears to be somewhat worse compared to her baseline.  There is no fluctuance or crepitation for this matter.  On the anterior lateral of the leg superficial wound which appears to be healing.   No pain with calf compression, warmth, erythema  Assessment: Venous insufficiency, cellulitis left leg  Plan: -All treatment options discussed with the patient including all alternatives, risks, complications.  -I cleaned the wound on the left leg with saline.  Small Betadine was applied followed by 4 x 4, Kerlix from the toes to the knee as well as Coflex.  Encouraged elevation. -She has a another appointment with [facility] scheduled for August for another opinion. -Given recurrent infection will refer to infectious disease. -Monitor for any clinical signs or symptoms of infection and directed to call the office immediately should any occur or go to the ER.   Trula Slade DPM

## 2022-02-15 DIAGNOSIS — H524 Presbyopia: Secondary | ICD-10-CM | POA: Diagnosis not present

## 2022-02-17 ENCOUNTER — Ambulatory Visit: Payer: Medicare HMO | Admitting: Podiatry

## 2022-02-17 DIAGNOSIS — L03116 Cellulitis of left lower limb: Secondary | ICD-10-CM

## 2022-02-17 DIAGNOSIS — I83023 Varicose veins of left lower extremity with ulcer of ankle: Secondary | ICD-10-CM

## 2022-02-17 DIAGNOSIS — L97329 Non-pressure chronic ulcer of left ankle with unspecified severity: Secondary | ICD-10-CM

## 2022-02-21 ENCOUNTER — Encounter: Payer: Self-pay | Admitting: Internal Medicine

## 2022-02-21 ENCOUNTER — Other Ambulatory Visit: Payer: Self-pay

## 2022-02-21 ENCOUNTER — Ambulatory Visit: Payer: Medicare HMO | Admitting: Internal Medicine

## 2022-02-21 ENCOUNTER — Ambulatory Visit: Payer: Medicare HMO | Admitting: Podiatry

## 2022-02-21 VITALS — BP 110/71 | HR 64 | Temp 98.7°F

## 2022-02-21 DIAGNOSIS — L03116 Cellulitis of left lower limb: Secondary | ICD-10-CM | POA: Diagnosis not present

## 2022-02-21 DIAGNOSIS — S81802S Unspecified open wound, left lower leg, sequela: Secondary | ICD-10-CM

## 2022-02-21 DIAGNOSIS — I872 Venous insufficiency (chronic) (peripheral): Secondary | ICD-10-CM

## 2022-02-21 DIAGNOSIS — S81802A Unspecified open wound, left lower leg, initial encounter: Secondary | ICD-10-CM | POA: Diagnosis not present

## 2022-02-21 NOTE — Progress Notes (Unsigned)
Patient Active Problem List   Diagnosis Date Noted   Trigger finger, left middle finger 10/26/2021   Trigger finger, right ring finger 10/26/2021   Trigger finger, right middle finger 10/26/2021   Benign neoplasm of ascending colon    Hx of adenomatous colonic polyps    Occult blood in stools    Unstable angina ([place]) 10/06/2020   Acute on chronic respiratory failure with hypoxia ([place]) 02/28/2019   OSA on CPAP 02/28/2019   Diabetes mellitus type 2, uncontrolled, with complications 67/73/7366   Breast cancer ([place]) 02/27/2019   History of cancer chemotherapy 02/27/2019   History of radiation therapy 02/27/2019   Diabetic neuropathy ([place]) 02/27/2019   HLD (hyperlipidemia) 02/27/2019   Essential hypertension 02/27/2019   QT prolongation 02/27/2019   Pneumonia due to COVID-19 virus 02/24/2019   Diarrhea    Benign neoplasm of cecum    Abdominal pain, epigastric    Gastroesophageal reflux disease    Dependence on nocturnal oxygen therapy 07/10/2017   Open wound of left lower leg 12/26/2016   Vitamin D deficiency 09/14/2016   History of total knee replacement, bilateral 09/14/2016   DJD (degenerative joint disease), cervical 09/14/2016   Spondylosis of lumbar region without myelopathy or radiculopathy 09/14/2016   High risk medication use 05/20/2016   Spinal stenosis, lumbar region, with neurogenic claudication 09/08/2015    Class: Chronic   OSA (obstructive sleep apnea) 10/17/2013   Dyspnea 09/04/2013   Atypical chest pain 09/04/2013   Morbid obesity ([place]) 03/21/2012   Cancer of lower-outer quadrant of female breast ([place]) 03/15/2012   Diabetes mellitus, type II ([place]) 02/22/2011   Coronary artery disease    Hypertension    Hyperlipidemia    OA (osteoarthritis) of knee     Patient's Medications  New Prescriptions   No medications on file  Previous Medications   ACCU-CHEK GUIDE TEST STRIP       ACETAMINOPHEN (TYLENOL) 500 MG TABLET    Take 1,000 mg by mouth at  bedtime.   ALBUTEROL (VENTOLIN HFA) 108 (90 BASE) MCG/ACT INHALER    Inhale 2 puffs into the lungs every 4 (four) hours as needed for wheezing or shortness of breath (cough, shortness of breath or wheezing.).   AMLODIPINE (NORVASC) 5 MG TABLET    Take 5 mg by mouth in the morning.   ASPIRIN EC 81 MG TABLET    Take 81 mg by mouth at bedtime. Swallow whole.   B COMPLEX VITAMINS CAPSULE    Take 1 capsule by mouth daily.   BACLOFEN (LIORESAL) 10 MG TABLET    TAKE 1 TABLET BY MOUTH TWICE A DAY AS NEEDED FOR SPASMS   BLOOD GLUCOSE METER KIT AND SUPPLIES    Dispense based on patient and insurance preference. Use up to four times daily as directed. (FOR ICD-10 E10.9, E11.9).   BUPROPION (WELLBUTRIN XL) 150 MG 24 HR TABLET    Take 150 mg by mouth in the morning.   BUSPIRONE (BUSPAR) 7.5 MG TABLET    Take 7.5 mg by mouth 2 (two) times daily.   CALCIUM CARB-CHOLECALCIFEROL (CALCIUM + D3 PO)    Take 1 tablet by mouth daily.   CHOLECALCIFEROL (VITAMIN D3) 25 MCG (1000 UT) TABLET    Take 1,000 Units by mouth at bedtime.   CLOBETASOL CREAM (TEMOVATE) 0.05 %    Apply 1 application topically daily.   CYANOCOBALAMIN (VITAMIN B-12 PO)    Take 1 capsule by mouth daily.  DICYCLOMINE (BENTYL) 10 MG CAPSULE    Take 1 capsule (10 mg total) by mouth 3 (three) times daily before meals.   DOXYCYCLINE (VIBRA-TABS) 100 MG TABLET    Take 1 tablet (100 mg total) by mouth 2 (two) times daily.   DULOXETINE (CYMBALTA) 60 MG CAPSULE    Take 60 mg by mouth in the morning.   ECHINACEA 400 MG CAPS    Take 400 mg by mouth daily.   EZETIMIBE (ZETIA) 10 MG TABLET    Take 1 tablet (10 mg total) by mouth daily.   FOLIC ACID (FOLVITE) 1 MG TABLET    Take 2 tablets (2 mg total) by mouth daily.   GABAPENTIN (NEURONTIN) 600 MG TABLET    Take 600 mg by mouth 3 (three) times daily.    GALANTAMINE (RAZADYNE ER) 16 MG 24 HR CAPSULE    Take 16 mg by mouth every morning.   HYDROCODONE-ACETAMINOPHEN (NORCO/VICODIN) 5-325 MG TABLET    Take 1 tablet  by mouth daily as needed (Arthritis).   HYDROXYCHLOROQUINE (PLAQUENIL) 200 MG TABLET    TAKE 1 TABLET TWICE DAILY FOR RHEUMATOID ARTHRITIS   LOPERAMIDE (IMODIUM A-D) 2 MG TABLET    Take 2 mg by mouth 4 (four) times daily as needed for diarrhea or loose stools.   MELOXICAM (MOBIC) 15 MG TABLET    Take 15 mg by mouth 2 (two) times a week. Wednesdays & Sundays   MONTELUKAST (SINGULAIR) 10 MG TABLET    Take 10 mg by mouth at bedtime.   MULTIPLE VITAMINS-MINERALS (CENTRUM SILVER PO)    Take 1 tablet by mouth daily.   MULTIPLE VITAMINS-MINERALS (HAIR SKIN NAILS PO)    Take 1 tablet by mouth daily.   MULTIPLE VITAMINS-MINERALS (ZINC PO)    Take 1 tablet by mouth daily.   MUPIROCIN OINTMENT (BACTROBAN) 2 %    Apply 1 application topically daily.   NEBIVOLOL HCL 20 MG TABS    Take 20 mg by mouth at bedtime.   NEXLETOL 180 MG TABS    TAKE 1 TABLET BY MOUTH EVERY DAY   NITROGLYCERIN (NITROSTAT) 0.4 MG SL TABLET    nitroglycerin 0.4 mg sublingual tablet   OLMESARTAN-HYDROCHLOROTHIAZIDE (BENICAR HCT) 40-25 MG TABLET    Take 1 tablet by mouth in the morning.   OMEPRAZOLE (PRILOSEC) 20 MG CAPSULE    Take 20 mg by mouth in the morning.   OVER THE COUNTER MEDICATION    Take 1 application by mouth daily as needed (wound care). Swedish bitters   OXYBUTYNIN (DITROPAN-XL) 10 MG 24 HR TABLET    Take 10 mg by mouth daily.   OXYGEN    Inhale 2 L into the lungs at bedtime.   POTASSIUM 99 MG TABS    Take 99 mg by mouth daily.   PROBIOTIC PRODUCT (PROBIOTIC DAILY PO)    Take 1 capsule by mouth daily.   PSYLLIUM (REGULOID) 0.52 G CAPSULE    Take 1.04 g by mouth 2 (two) times daily.   TART CHERRY PO    Take 480 mg by mouth 2 (two) times daily.  Modified Medications   No medications on file  Discontinued Medications   No medications on file    Subjective: 76 year old female with past medical history as below presents for evaluation of left lower extremity cellulitis.  She has a history of venous stasis.  Seen by  [place] vein specialist as well as vein and vascular.  States that she previously had intervention for renal insufficiency.  She has had wound on the left lower extremity for quite a while which was originally treated by PCP.  She has been on doxycycline.  Referred to ID for recurrent infection, by Dr. Earleen Newport podiatry.  Due to open wound and concern for cellulitis.  Of note she has an appointment with [facility] scheduled for August.  Review of Systems: ROS  Past Medical History:  Diagnosis Date   Actinic keratosis    Anxiety    Asthma    Albuterol inhaler as needed.Pulmicort neb as needed   Breast cancer ([place])    right - lumpectomy    Chronic back pain    spinal stenosis   Coronary artery disease    COVID    uses o2 since at hs   Dependence on nocturnal oxygen therapy 07/10/2017   Pt uses 2 liters 02 at night    Depression    takes CYmbalta and Wellbutrin daily   Diabetes mellitus    not on any meds/controlled by diet   Dyspnea    with exertion occasionally when lies down   GERD (gastroesophageal reflux disease)    takes Omeprazole daily   Headache    oocasionally   History of kidney stones    Hyperlipidemia    not on any meds   Hypertension    takes Benazepril,Bystolic,and Amlodipine daily   IBS (irritable bowel syndrome)    Joint pain    Joint swelling    Livedoid vasculitis    Nocturia    OA (osteoarthritis) of knee    Other seborrheic keratosis    Oxygen deficiency    2 liters at night per pt for OSA- no cpap    Peripheral edema    takes daily as needed   Peripheral neuropathy    Personal history of chemotherapy    Personal history of radiation therapy    Pneumonia    hx of-several yrs ago   RA (rheumatoid arthritis) ([place])    Urinary frequency    Urinary urgency    Weakness    numbness and tingling mainly in left leg occasionally in right.Tingling/numbness in hands    Social History   Tobacco Use   Smoking status: Never    Passive exposure:  Never   Smokeless tobacco: Never  Vaping Use   Vaping Use: Never used  Substance Use Topics   Alcohol use: No    Alcohol/week: 0.0 standard drinks of alcohol   Drug use: Never    Family History  Problem Relation Age of Onset   Heart disease Mother    Hyperlipidemia Mother    Lung cancer Father    Diabetes Father    Hyperlipidemia Father    Heart disease Sister    Hypertension Sister    Hyperlipidemia Sister    Heart disease Brother    Diabetes Brother    Hypertension Brother    Hyperlipidemia Brother    Lung cancer Brother    Heart disease Brother    Hypertension Brother    Hyperlipidemia Brother    Heart disease Brother    Hypertension Brother    Hyperlipidemia Brother    Heart disease Maternal Aunt    Asthma Maternal Uncle    Heart disease Maternal Uncle     Allergies  Allergen Reactions   Statins Other (See Comments)    Joint pain and swelling/burning   Alirocumab Other (See Comments)    (praluent) all sorts of side effects. strange.  she had burning, weakness, felt joints hurt more,  felt asthma flared.   Aricept [Donepezil]     leg pains   Atorvastatin     Leg pain   Crestor [Rosuvastatin]     Other reaction(s): even 1/2 of 5 mg 2-3 days a week couldn't take due to muscle side effects.   Diazepam     "makes me crazy"   Dry Skin Treatment [Albolene]     Other reaction(s): contact dermatitis when in covid hospital   Evolocumab     (Repatha) felt like going to pass out.legs were weak.   Ezetimibe     leg cramps   Fenofibrate Micronized     cramps   Fentanyl Other (See Comments)    loopy   Loratadine     leg cramps   Metformin     upset stomach   Morphine And Related Other (See Comments)    hallucinations   Niaspan [Niacin]     burning   Phentermine-Topiramate     Other reaction(s): just didn't work for her.   Welchol [Colesevelam]     Leg cramps   Zocor [Simvastatin]     muscle aches   Azithromycin Rash    Legs burning     Health  Maintenance  Topic Date Due   FOOT EXAM  Never done   Hepatitis C Screening  Never done   TETANUS/TDAP  Never done   DEXA SCAN  Never done   Pneumonia Vaccine 35+ Years old (2 - PPSV23 or PCV20) 08/26/2017   HEMOGLOBIN A1C  08/29/2019   COVID-19 Vaccine (4 - Booster for Pfizer series) 05/30/2020   OPHTHALMOLOGY EXAM  03/02/2021   INFLUENZA VACCINE  02/22/2022   Zoster Vaccines- Shingrix  Completed   HPV VACCINES  Aged Out   COLONOSCOPY (Pts 45-38yr Insurance coverage will need to be confirmed)  Discontinued    Objective:  There were no vitals filed for this visit. There is no height or weight on file to calculate BMI.  Physical Exam  Lab Results Lab Results  Component Value Date   WBC 7.5 12/07/2021   HGB 13.1 12/07/2021   HCT 40.1 12/07/2021   MCV 91.1 12/07/2021   PLT 208 12/07/2021    Lab Results  Component Value Date   CREATININE 0.53 (L) 12/07/2021   BUN 18 12/07/2021   NA 140 12/07/2021   K 3.9 12/07/2021   CL 101 12/07/2021   CO2 29 12/07/2021    Lab Results  Component Value Date   ALT 18 12/07/2021   AST 20 12/07/2021   ALKPHOS 54 10/02/2020   BILITOT 0.5 12/07/2021    Lab Results  Component Value Date   CHOL 210 (H) 10/02/2020   HDL 52 10/02/2020   LDLCALC 125 (H) 10/02/2020   TRIG 188 (H) 10/02/2020   CHOLHDL 4.0 10/02/2020   No results found for: "LABRPR", "RPRTITER" No results found for: "HIV1RNAQUANT", "HIV1RNAVL", "CD4TABS"   Problem List Items Addressed This Visit   None #Venous insufficiency ulcers #Concern for left lower extremity cellulitis - On image review imaging in chart from 726 versus today local looks less arthritis.  Certainly open wound could be port of entry for cellulitis.  I suspect that her erythema left lower extremity is represented as a venous stasis and chronic ulcer rather than recurrent left lower extremity cellulitis.  The past that she has been on prolonged course of doxycycline without evidence of skin/2 hip  infection. -06/29/20 seen by vascular Dr. CDonzetta Matters Noted that she had previous lesser and greater saphenous vein  ablation bilaterally.  She had left leg  redness and wounds at that time.  -ABI 4/20/5/6 showed normal right lower extremity, noncompressible left lower extremity arteries.  Normal left toe brachial index. Plan - DC doxycycline - Agree with follow-up with vascular surgery note there is a note in chart on 1 - Continue follow-up podiatry - Follow-up with ID PRN    Laurice Record, MD [facility] 02/21/2022, 2:20 PM

## 2022-02-22 LAB — HM DIABETES EYE EXAM

## 2022-02-23 NOTE — Progress Notes (Signed)
Subjective: 76 year old female presents the office today for follow-up evaluation of ongoing wound of her left foot, venous stasis.  She presents today for dressing change.    She previously been seen by [place] vein specialist as well as vein and vascular.  She states that she previously had intervention for the venous insufficiency.  She had a wound to her left leg for quite some time that was originally treated by her primary care doctor.  Currently denies any fevers or chills.  Has been on multiple rounds of doxycyline which helps  Objective: AAO x3, NAD DP/PT pulses palpable bilaterally, CRT less than 3 seconds Chronic swelling present in the left leg.  On the medial aspect of the calf for 3 punctate superficial wounds without any probing but there is a small amount of blistering some drainage expressed.  Erythema appears to be improved.  There is no fluctuance or crepitation for this matter.  On the anterior lateral of the leg superficial wound which appears to be healing.   No pain with calf compression, warmth, erythema  Assessment: Venous insufficiency, cellulitis left leg  Plan: -All treatment options discussed with the patient including all alternatives, risks, complications.  -I cleaned the wound on the left leg with saline.  Small Betadine was applied followed by 4 x 4, Kerlix from the toes to the knee as well as Coflex.  Encouraged elevation. -She has a another appointment with [facility] scheduled for August for another opinion. -Infectious disease appointment scheduled. -Monitor for any clinical signs or symptoms of infection and directed to call the office immediately should any occur or go to the ER.   Trula Slade DPM

## 2022-02-23 NOTE — Progress Notes (Signed)
Subjective: 76 year old female presents the office today for follow-up evaluation of ongoing wound of her left foot, venous stasis.  She presents today for dressing change.  She has follow-up with infectious disease later today.  She has an appointment scheduled for 2 weeks with vascular surgery.  She previously been seen by [place] vein specialist as well as vein and vascular.  She states that she previously had intervention for the venous insufficiency.  She had a wound to her left leg for quite some time that was originally treated by her primary care doctor.  Currently denies any fevers or chills.  Has been on multiple rounds of doxycyline which helps  Objective: AAO x3, NAD DP/PT pulses palpable bilaterally, CRT less than 3 seconds Chronic swelling present in the left leg.  On the medial aspect of the calf for 3 punctate superficial wounds without any probing but there is a small amount of blistering some drainage expressed.  Mild erythema today without any drainage or pus.  No fluctuance or crepitation for this matter.  No pain with calf compression, warmth, erythema       Assessment: Venous insufficiency, recurrent cellulitis left leg  Plan: -All treatment options discussed with the patient including all alternatives, risks, complications.  -I cleaned the wound on the left leg with saline.  Small Betadine was applied followed by 4 x 4, Kerlix from the toes to the knee as well as Coflex.  Encouraged elevation. -She has a another appointment with [facility] scheduled for August for another opinion. -Infectious disease appointment later today.  She does respond to doxycycline.  Majority the color changes likely due to venous insufficiency but still like to have her evaluated by infectious disease. -Monitor for any clinical signs or symptoms of infection and directed to call the office immediately should any occur or go to the ER.   Trula Slade DPM

## 2022-02-25 ENCOUNTER — Ambulatory Visit: Payer: Medicare HMO | Admitting: Podiatry

## 2022-02-25 DIAGNOSIS — L97329 Non-pressure chronic ulcer of left ankle with unspecified severity: Secondary | ICD-10-CM | POA: Diagnosis not present

## 2022-02-25 DIAGNOSIS — I83023 Varicose veins of left lower extremity with ulcer of ankle: Secondary | ICD-10-CM

## 2022-02-25 DIAGNOSIS — I872 Venous insufficiency (chronic) (peripheral): Secondary | ICD-10-CM

## 2022-02-25 NOTE — Progress Notes (Signed)
Subjective: 76 year old female presents the office today for follow-up evaluation of ongoing wound of her left foot, venous stasis.  She did follow-up with infectious disease ibuprofen for the edema clinic placed.  She also has ongoing appointment with vascular surgery.  She presents for dressing change.  She has no new concerns otherwise.  She previously been seen by [place] vein specialist as well as vein and vascular.  She states that she previously had intervention for the venous insufficiency.  She had a wound to her left leg for quite some time that was originally treated by her primary care doctor.  Currently denies any fevers or chills.  Has been on multiple rounds of doxycyline which helps  Objective: AAO x3, NAD DP/PT pulses palpable bilaterally, CRT less than 3 seconds Chronic swelling present in the left leg.  On the medial aspect of the calf for 3 punctate superficial wounds without any probing but there is a small amount of blistering some drainage expressed.  Minimal erythema today without any drainage or pus.  No fluctuance or crepitation for this matter.  Appears to be stable compared to previous appointments. No pain with calf compression, warmth, erythema  Assessment: Venous insufficiency, recurrent cellulitis left leg  Plan: -All treatment options discussed with the patient including all alternatives, risks, complications.  -I cleaned the wound on the left leg with saline.  Small Betadine was applied followed by 4 x 4, Kerlix from the toes to the knee as well as Coflex.  Encouraged elevation. -She has a another appointment with [facility] scheduled for August for another opinion. -Previously saw infectious disease. -Referral to lymphedema clinic.  Updated this to change this to [facility]. -Monitor for any clinical signs or symptoms of infection and directed to call the office immediately should any occur or go to the ER.   Trula Slade DPM

## 2022-02-28 DIAGNOSIS — M069 Rheumatoid arthritis, unspecified: Secondary | ICD-10-CM | POA: Diagnosis not present

## 2022-02-28 DIAGNOSIS — M5030 Other cervical disc degeneration, unspecified cervical region: Secondary | ICD-10-CM | POA: Diagnosis not present

## 2022-02-28 DIAGNOSIS — M47812 Spondylosis without myelopathy or radiculopathy, cervical region: Secondary | ICD-10-CM | POA: Diagnosis not present

## 2022-02-28 DIAGNOSIS — Z79899 Other long term (current) drug therapy: Secondary | ICD-10-CM | POA: Diagnosis not present

## 2022-02-28 DIAGNOSIS — M47816 Spondylosis without myelopathy or radiculopathy, lumbar region: Secondary | ICD-10-CM | POA: Diagnosis not present

## 2022-03-01 ENCOUNTER — Ambulatory Visit: Payer: Medicare HMO | Admitting: Podiatry

## 2022-03-01 DIAGNOSIS — I83023 Varicose veins of left lower extremity with ulcer of ankle: Secondary | ICD-10-CM

## 2022-03-01 DIAGNOSIS — I872 Venous insufficiency (chronic) (peripheral): Secondary | ICD-10-CM

## 2022-03-01 DIAGNOSIS — L97329 Non-pressure chronic ulcer of left ankle with unspecified severity: Secondary | ICD-10-CM | POA: Diagnosis not present

## 2022-03-03 ENCOUNTER — Ambulatory Visit: Payer: Medicare HMO | Admitting: Podiatry

## 2022-03-03 DIAGNOSIS — Z79899 Other long term (current) drug therapy: Secondary | ICD-10-CM | POA: Diagnosis not present

## 2022-03-04 ENCOUNTER — Ambulatory Visit (INDEPENDENT_AMBULATORY_CARE_PROVIDER_SITE_OTHER): Payer: Medicare HMO | Admitting: Podiatry

## 2022-03-04 DIAGNOSIS — I83023 Varicose veins of left lower extremity with ulcer of ankle: Secondary | ICD-10-CM

## 2022-03-04 DIAGNOSIS — L97329 Non-pressure chronic ulcer of left ankle with unspecified severity: Secondary | ICD-10-CM

## 2022-03-04 NOTE — Progress Notes (Unsigned)
Seen today for dressing change to left leg. Wound to left leg was cleansed saline. Small amount of Betadine was applied followed by 4 x 4, Kerlix from the knee to above the ankle as well as Coflex. Patient tolerated procedure well.   Patient denies any fever, nausea, vomiting, chills or chest pain at this time.   Patient will return on Monday for dressing change.    Patient was seen by RN for dressing change today.

## 2022-03-06 NOTE — Progress Notes (Signed)
Subjective: 76 year old female presents the office today for follow-up evaluation of ongoing wound of her left foot, venous stasis.  Awaiting referral for the lymphedema clinic but apparently this is a couple of months away.  She is scheduled see vascular surgery next week.   She previously been seen by [place] vein specialist as well as vein and vascular in [place].  She states that she previously had intervention for the venous insufficiency.  She had a wound to her left leg for quite some time that was originally treated by her primary care doctor.  Currently denies any fevers or chills.  Has been on multiple rounds of doxycyline which helps  Objective: AAO x3, NAD DP/PT pulses palpable bilaterally, CRT less than 3 seconds Chronic swelling present in the left leg.  On the medial aspect of the calf for 3 punctate superficial wounds without any probing but there is a small amount of blistering some drainage expressed.  Minimal erythema today without any drainage or pus.  No fluctuance or crepitation for this matter.   Overall appears to be unchanged. No pain with calf compression, warmth, erythema  Assessment: Venous insufficiency, recurrent cellulitis left leg  Plan: -All treatment options discussed with the patient including all alternatives, risks, complications.  -I cleaned the wound on the left leg with saline.  Small Betadine was applied followed by 4 x 4, Kerlix from the toes to the knee as well as Coflex.  Encouraged elevation. -She has a another appointment with [facility] scheduled for August for another opinion. -Previously saw infectious disease. -Discussed wound care referral however co-pays are quite expensive and she would like to hold off on this if possible.  Awaiting lymphedema clinic. -Monitor for any clinical signs or symptoms of infection and directed to call the office immediately should any occur or go to the ER.   [name] DPM

## 2022-03-07 ENCOUNTER — Ambulatory Visit: Payer: Medicare HMO | Admitting: Podiatry

## 2022-03-07 DIAGNOSIS — I83023 Varicose veins of left lower extremity with ulcer of ankle: Secondary | ICD-10-CM

## 2022-03-07 DIAGNOSIS — L97329 Non-pressure chronic ulcer of left ankle with unspecified severity: Secondary | ICD-10-CM

## 2022-03-07 NOTE — Progress Notes (Signed)
Seen today for dressing change to left leg. Wound to left leg was cleansed with saline. Small amount of Betadine was applied followed by 4 x 4, Kerlix from the knee to above the ankle as well as Coflex. Patient tolerated procedure well.    Patient denies any fever, nausea, vomiting, chills or chest pain at this time.

## 2022-03-08 ENCOUNTER — Encounter (INDEPENDENT_AMBULATORY_CARE_PROVIDER_SITE_OTHER): Payer: Medicare HMO | Admitting: Vascular Surgery

## 2022-03-10 ENCOUNTER — Ambulatory Visit (INDEPENDENT_AMBULATORY_CARE_PROVIDER_SITE_OTHER): Payer: Medicare HMO | Admitting: Podiatry

## 2022-03-10 DIAGNOSIS — L97329 Non-pressure chronic ulcer of left ankle with unspecified severity: Secondary | ICD-10-CM

## 2022-03-10 DIAGNOSIS — I83023 Varicose veins of left lower extremity with ulcer of ankle: Secondary | ICD-10-CM

## 2022-03-10 NOTE — Progress Notes (Signed)
Seen today for dressing change to left leg. Wound to left leg was cleansed with saline. Small amount of Betadine was applied followed by 4 x 4, Kerlix from the knee to above the ankle as well as Coflex. Patient tolerated procedure well.    Patient denies any fever, nausea, vomiting, chills or chest pain at this time.     Patient stated she was not seen at the vein and vascular clinic for evaluation due to an appointment error. Patient will be seen September 1st.

## 2022-03-14 ENCOUNTER — Ambulatory Visit (INDEPENDENT_AMBULATORY_CARE_PROVIDER_SITE_OTHER): Payer: Medicare HMO | Admitting: *Deleted

## 2022-03-14 DIAGNOSIS — L97329 Non-pressure chronic ulcer of left ankle with unspecified severity: Secondary | ICD-10-CM

## 2022-03-14 DIAGNOSIS — I83023 Varicose veins of left lower extremity with ulcer of ankle: Secondary | ICD-10-CM

## 2022-03-14 NOTE — Progress Notes (Signed)
Patient presents today for a dressing change to the left lower leg.   She has a few venous stasis wounds to the lower left leg. She comes in twice a week to have the dressings changed.   Patient did not have leg wrapped when she presented today, but she did says she had just taken a shower prior to the visit. No signs of infection.  Areas were dressed with betadine ointment covered with a telfa pad and a DSD. Acewrap applied as outer layer.   She was advised to keep on and dry until next follow up appointment with Dr. Jacqualyn Posey on 03/18/2022.

## 2022-03-15 ENCOUNTER — Ambulatory Visit: Payer: Medicare HMO | Admitting: Occupational Therapy

## 2022-03-18 ENCOUNTER — Ambulatory Visit: Payer: Medicare HMO | Admitting: Podiatry

## 2022-03-18 DIAGNOSIS — L97329 Non-pressure chronic ulcer of left ankle with unspecified severity: Secondary | ICD-10-CM

## 2022-03-18 DIAGNOSIS — I83023 Varicose veins of left lower extremity with ulcer of ankle: Secondary | ICD-10-CM | POA: Diagnosis not present

## 2022-03-18 DIAGNOSIS — I872 Venous insufficiency (chronic) (peripheral): Secondary | ICD-10-CM

## 2022-03-21 NOTE — Progress Notes (Signed)
Subjective: 76 year old female presents the office today for follow-up evaluation of ongoing wound of her left foot, venous stasis.  Awaiting referral for the lymphedema clinic as well as for vascular referral.  After last time the dressing was wrapped.  Denies any fevers or chills.  Objective: AAO x3, NAD DP/PT pulses palpable bilaterally, CRT less than 3 seconds Chronic swelling present in the left leg.  On the medial aspect of the calf for 3 punctate superficial wounds without any probing but there is a small amount of blistering some drainage expressed.  Minimal erythema today without any drainage or pus.  No fluctuance or crepitation for this matter.   Overall appears to be unchanged. No pain with calf compression, warmth, erythema     Assessment: Venous insufficiency, recurrent cellulitis left leg  Plan: -All treatment options discussed with the patient including all alternatives, risks, complications.  -I cleaned the wound on the left leg with saline.  Dressing was applied followed by 4 x 4, Kerlix from the toes to the knee as well as Coflex.  Encouraged elevation. -She has a another appointment with [facility] scheduled for August for another opinion. -Previously saw infectious disease. -Discussed wound care referral however co-pays are quite expensive and she would like to hold off on this if possible.  Awaiting lymphedema clinic. -Monitor for any clinical signs or symptoms of infection and directed to call the office immediately should any occur or go to the ER.   Trula Slade DPM

## 2022-03-22 ENCOUNTER — Ambulatory Visit: Payer: Medicare HMO | Admitting: Podiatry

## 2022-03-22 DIAGNOSIS — I872 Venous insufficiency (chronic) (peripheral): Secondary | ICD-10-CM

## 2022-03-22 DIAGNOSIS — L97329 Non-pressure chronic ulcer of left ankle with unspecified severity: Secondary | ICD-10-CM

## 2022-03-22 DIAGNOSIS — I83023 Varicose veins of left lower extremity with ulcer of ankle: Secondary | ICD-10-CM

## 2022-03-24 ENCOUNTER — Other Ambulatory Visit: Payer: Self-pay | Admitting: Physician Assistant

## 2022-03-24 DIAGNOSIS — M0609 Rheumatoid arthritis without rheumatoid factor, multiple sites: Secondary | ICD-10-CM

## 2022-03-24 NOTE — Telephone Encounter (Signed)
Next Visit: 05/10/2022  Last Visit: 12/07/2021  Labs: 01/26/2022 Glucose 160, MCH 32.7, MPV, Cholesterol 209, Triglycerides 155, Hgb A1C 7.0  Eye exam: 07/29/2021 WNL   Current Dose per office note 12/07/2021: Plaquenil 200 mg 1 tablet by mouth twice daily  [id number] arthritis of multiple sites with negative rheumatoid factor   Last Fill: 10/11/2021  Okay to refill Plaquenil?

## 2022-03-25 ENCOUNTER — Other Ambulatory Visit: Payer: Medicare HMO

## 2022-03-25 ENCOUNTER — Encounter (INDEPENDENT_AMBULATORY_CARE_PROVIDER_SITE_OTHER): Payer: Medicare HMO | Admitting: Vascular Surgery

## 2022-03-25 NOTE — Progress Notes (Signed)
Subjective: 76 year old female presents the office today for follow-up evaluation of ongoing wound of her left foot, venous stasis.  Awaiting referral for the lymphedema clinic as well as for vascular referral.  She is followed with vascular surgery on Friday.  No fevers or chills.  No other concerns.   Objective: AAO x3, NAD DP/PT pulses palpable bilaterally, CRT less than 3 seconds Chronic swelling present in the left leg.  On the medial aspect of the calf for 3 punctate superficial wounds without any probing but there is a small amount of blistering some drainage expressed.  Minimal erythema today without any drainage or pus.  No fluctuance or crepitation for this matter.  No pain with calf compression, warmth, erythema  Assessment: Venous insufficiency, recurrent cellulitis left leg  Plan: -All treatment options discussed with the patient including all alternatives, risks, complications.  -I cleaned the wound on the left leg with saline.  Dressing was applied followed by 4 x 4, Kerlix from the toes to the knee as well as Coflex.  Encouraged elevation.  We tried [employer] previously but this was irritating her skin. -She has a another appointment with [facility] scheduled later this week -Previously saw infectious disease. -Discussed wound care referral however co-pays are quite expensive and she would like to hold off on this if possible.  Awaiting lymphedema clinic. -Monitor for any clinical signs or symptoms of infection and directed to call the office immediately should any occur or go to the ER.   Trula Slade DPM

## 2022-03-29 ENCOUNTER — Ambulatory Visit (INDEPENDENT_AMBULATORY_CARE_PROVIDER_SITE_OTHER): Payer: Medicare HMO

## 2022-03-29 DIAGNOSIS — L97329 Non-pressure chronic ulcer of left ankle with unspecified severity: Secondary | ICD-10-CM

## 2022-03-29 DIAGNOSIS — I83023 Varicose veins of left lower extremity with ulcer of ankle: Secondary | ICD-10-CM

## 2022-03-29 NOTE — Progress Notes (Signed)
76 year old female presents the office today for follow-up evaluation of ongoing wound of her left foot, venous stasis.  I cleaned the wound on the left leg with saline.  Dressing was applied followed by 4 x 4, Kerlix from the toes to the knee as well as an ace wrap.   Patient states her appointment with vascular surgery was canceled on Friday due to provider being in surgery. Patient has been rescheduled to be seen on this coming Friday.     Monitor for any clinical signs or symptoms of infection and directed to call the office immediately should any occur or go to the ER.  Patient verbalized understanding.

## 2022-04-01 ENCOUNTER — Other Ambulatory Visit: Payer: Medicare HMO

## 2022-04-01 ENCOUNTER — Ambulatory Visit (INDEPENDENT_AMBULATORY_CARE_PROVIDER_SITE_OTHER): Payer: Self-pay

## 2022-04-01 ENCOUNTER — Ambulatory Visit (INDEPENDENT_AMBULATORY_CARE_PROVIDER_SITE_OTHER): Payer: Medicare HMO | Admitting: Vascular Surgery

## 2022-04-01 ENCOUNTER — Encounter (INDEPENDENT_AMBULATORY_CARE_PROVIDER_SITE_OTHER): Payer: Self-pay | Admitting: Vascular Surgery

## 2022-04-01 VITALS — BP 154/74 | HR 55 | Resp 16 | Wt 236.0 lb

## 2022-04-01 DIAGNOSIS — I10 Essential (primary) hypertension: Secondary | ICD-10-CM

## 2022-04-01 DIAGNOSIS — E7800 Pure hypercholesterolemia, unspecified: Secondary | ICD-10-CM

## 2022-04-01 DIAGNOSIS — E1140 Type 2 diabetes mellitus with diabetic neuropathy, unspecified: Secondary | ICD-10-CM

## 2022-04-01 DIAGNOSIS — L97221 Non-pressure chronic ulcer of left calf limited to breakdown of skin: Secondary | ICD-10-CM

## 2022-04-01 DIAGNOSIS — I890 Lymphedema, not elsewhere classified: Secondary | ICD-10-CM | POA: Diagnosis not present

## 2022-04-01 DIAGNOSIS — I83023 Varicose veins of left lower extremity with ulcer of ankle: Secondary | ICD-10-CM

## 2022-04-01 DIAGNOSIS — L97329 Non-pressure chronic ulcer of left ankle with unspecified severity: Secondary | ICD-10-CM

## 2022-04-01 NOTE — Assessment & Plan Note (Signed)
The patient has clearly developed at least stage II lymphedema likely stage III lymphedema now with weeping of the tissue and recurrent ulceration.  She has fixed skin changes and recurring ulcers and swelling refractory to elevation.  Swelling is improved slightly with compression wraps but she has had issues wearing compression socks in the past.  I prescribed her the juxta lite compression system but would think Unna boots until her wounds are healed would be the best option.  She would clearly benefit from a lymphedema pump and would also benefit from a referral to the lymphedema clinic once her wounds are healed.  Were also going to get a repeat venous duplex as this has not been done in about 2 years to ensure that no significant venous disease is present that could be contributing to symptoms.  It was discussed with patient if she does not improve her compression she is not going to get her lymphedema under better control.  See her back with a duplex in the next few weeks and we can do the weekly Unna boots if desired.

## 2022-04-01 NOTE — Assessment & Plan Note (Signed)
lipid control important in reducing the progression of atherosclerotic disease. Continue statin therapy  

## 2022-04-01 NOTE — Assessment & Plan Note (Signed)
She has been getting wraps from her podiatrist and the wound seems to be improving.  I would consider weekly Unna boots and we are happy to do those going forward if need be.  Once we get her wounds healed, we will try to get her in a more stable compression garment and a lymphedema pump.

## 2022-04-01 NOTE — Assessment & Plan Note (Signed)
blood pressure control important in reducing the progression of atherosclerotic disease. On appropriate oral medications.  

## 2022-04-01 NOTE — Progress Notes (Signed)
Patient ID: [name], female   DOB: 1946-06-03, 76 y.o.   MRN: [id number]  Chief Complaint  Patient presents with   New Patient (Initial Visit)    Ref Jacqualyn Posey consult venous insufficiency    HPI [name] is a 76 y.o. female.  I am asked to see the patient by Dr. Jacqualyn Posey for evaluation of left lower extremity swelling and recurring ulcerations of the left leg.  The patient reports this going on for at least 5 years and affecting her essentially every summer.  She has not been able to wear compression socks in the past as she says they break her legs out and make the wounds worse.  She elevates her legs some.  She has a previous history of trauma with fracture and tendon injury many years ago in the left foot.  She denies any known history of DVT or superficial thrombophlebitis.  She has had previous venous ablation procedures but her swelling is continued to progress in the left leg after those.  She also had an arterial work-up done in [place] earlier this year which was normal.  Her last venous ultrasound appears to be back in 2021.  The right lower extremity does not have significant swelling the left lower extremity has persistent swelling.  She has been getting wound care with wraps done by her podiatrist for the past several weeks.  She denies fever or chills.  The wounds are improved but not entirely resolved.     Past Medical History:  Diagnosis Date   Actinic keratosis    Anxiety    Asthma    Albuterol inhaler as needed.Pulmicort neb as needed   Breast cancer ([place])    right - lumpectomy    Chronic back pain    spinal stenosis   Coronary artery disease    COVID    uses o2 since at hs   Dependence on nocturnal oxygen therapy 07/10/2017   Pt uses 2 liters 02 at night    Depression    takes CYmbalta and Wellbutrin daily   Diabetes mellitus    not on any meds/controlled by diet   Dyspnea    with exertion occasionally when lies down   GERD (gastroesophageal reflux  disease)    takes Omeprazole daily   Headache    oocasionally   History of kidney stones    Hyperlipidemia    not on any meds   Hypertension    takes Benazepril,Bystolic,and Amlodipine daily   IBS (irritable bowel syndrome)    Joint pain    Joint swelling    Livedoid vasculitis    Nocturia    OA (osteoarthritis) of knee    Other seborrheic keratosis    Oxygen deficiency    2 liters at night per pt for OSA- no cpap    Peripheral edema    takes daily as needed   Peripheral neuropathy    Personal history of chemotherapy    Personal history of radiation therapy    Pneumonia    hx of-several yrs ago   RA (rheumatoid arthritis) ([place])    Urinary frequency    Urinary urgency    Weakness    numbness and tingling mainly in left leg occasionally in right.Tingling/numbness in hands    Past Surgical History:  Procedure Laterality Date   BIOPSY  06/24/2021   Procedure: BIOPSY;  Surgeon: Ladene Artist, MD;  Location: [facility];  Service: Endoscopy;;   BREAST LUMPECTOMY  1997  RIGHT BREAST   CARDIAC CATHETERIZATION  04/23/2004   EF 60%   cataract surgery Bilateral    COLONOSCOPY     COLONOSCOPY WITH PROPOFOL N/A 08/07/2017   Procedure: COLONOSCOPY WITH PROPOFOL;  Surgeon: Ladene Artist, MD;  Location: [facility];  Service: Endoscopy;  Laterality: N/A;   COLONOSCOPY WITH PROPOFOL N/A 06/24/2021   Procedure: COLONOSCOPY WITH PROPOFOL;  Surgeon: Ladene Artist, MD;  Location: [facility];  Service: Endoscopy;  Laterality: N/A;   CORONARY ARTERY BYPASS GRAFT  2005   LIMA GRAFT TO LAD, SAPHENOUS VEIN GRAFT TO THE FIRST DIAGONAL, AND LEFT RADIAL ARTERY GRAFT TO THE OM   ESOPHAGOGASTRODUODENOSCOPY (EGD) WITH PROPOFOL N/A 08/07/2017   Procedure: ESOPHAGOGASTRODUODENOSCOPY (EGD) WITH PROPOFOL;  Surgeon: Ladene Artist, MD;  Location: [facility];  Service: Endoscopy;  Laterality: N/A;   FOOT SURGERY Right    JOINT REPLACEMENT     LITHOTRIPSY     LUMBAR  LAMINECTOMY/DECOMPRESSION MICRODISCECTOMY N/A 09/08/2015   Procedure: CENTRAL DECOMPRESSIVE LUMBAR LAMINECTOMIES L3-4, L4-5 AND BILATERAL HEMILAMINECTOMY L5-S1;  Surgeon: Jessy Oto, MD;  Location: [facility];  Service: Orthopedics;  Laterality: N/A;   nodule removed from left elbow     POLYPECTOMY  06/24/2021   Procedure: POLYPECTOMY;  Surgeon: Ladene Artist, MD;  [facility]: [facility];  Service: Endoscopy;;   RIGHT/LEFT HEART CATH AND CORONARY/GRAFT ANGIOGRAPHY N/A 10/06/2020   Procedure: RIGHT/LEFT HEART CATH AND CORONARY/GRAFT ANGIOGRAPHY;  Surgeon: Martinique, Peter M, MD;  Location: [facility];  Service: Cardiovascular;  Laterality: N/A;   SHOULDER ARTHROSCOPY     TOTAL KNEE ARTHROPLASTY Bilateral    TOTAL SHOULDER ARTHROPLASTY     TRIPLE BYPASS  04/27/05   US ECHOCARDIOGRAPHY  01/06/2009   EF 55-60%   WRIST SURGERY Left      Family History  Problem Relation Age of Onset   Heart disease Mother    Hyperlipidemia Mother    Lung cancer Father    Diabetes Father    Hyperlipidemia Father    Heart disease Sister    Hypertension Sister    Hyperlipidemia Sister    Heart disease Brother    Diabetes Brother    Hypertension Brother    Hyperlipidemia Brother    Lung cancer Brother    Heart disease Brother    Hypertension Brother    Hyperlipidemia Brother    Heart disease Brother    Hypertension Brother    Hyperlipidemia Brother    Heart disease Maternal Aunt    Asthma Maternal Uncle    Heart disease Maternal Uncle       Social History   Tobacco Use   Smoking status: Never    Passive exposure: Never   Smokeless tobacco: Never  Vaping Use   Vaping Use: Never used  Substance Use Topics   Alcohol use: No    Alcohol/week: 0.0 standard drinks of alcohol   Drug use: Never    Allergies  Allergen Reactions   Statins Other (See Comments)    Joint pain and swelling/burning   Alirocumab Other (See Comments)    (praluent) all sorts of side effects. strange.  she had  burning, weakness, felt joints hurt more, felt asthma flared.   Aricept [Donepezil]     leg pains   Atorvastatin     Leg pain   Crestor [Rosuvastatin]     Other reaction(s): even 1/2 of 5 mg 2-3 days a week couldn't take due to muscle side effects.   Diazepam     "makes  me crazy"   Dry Skin Treatment [Albolene]     Other reaction(s): contact dermatitis when in covid hospital   Evolocumab     (Repatha) felt like going to pass out.legs were weak.   Ezetimibe     leg cramps   Fenofibrate Micronized     cramps   Fentanyl Other (See Comments)    loopy   Loratadine     leg cramps   Metformin     upset stomach   Morphine And Related Other (See Comments)    hallucinations   Niaspan [Niacin]     burning   Phentermine-Topiramate     Other reaction(s): just didn't work for her.   Welchol [Colesevelam]     Leg cramps   Zocor [Simvastatin]     muscle aches   Azithromycin Rash    Legs burning     Current Outpatient Medications  Medication Sig Dispense Refill   ACCU-CHEK GUIDE test strip      acetaminophen (TYLENOL) 500 MG tablet Take 1,000 mg by mouth at bedtime.     albuterol (VENTOLIN HFA) 108 (90 Base) MCG/ACT inhaler Inhale 2 puffs into the lungs every 4 (four) hours as needed for wheezing or shortness of breath (cough, shortness of breath or wheezing.). 6.7 g 0   amLODipine (NORVASC) 5 MG tablet Take 5 mg by mouth in the morning.     aspirin EC 81 MG tablet Take 81 mg by mouth at bedtime. Swallow whole.     b complex vitamins capsule Take 1 capsule by mouth daily.     baclofen (LIORESAL) 10 MG tablet TAKE 1 TABLET BY MOUTH TWICE A DAY AS NEEDED FOR SPASMS (Patient taking differently: Take 10 mg by mouth 2 (two) times daily.) 180 tablet 0   blood glucose meter kit and supplies Dispense based on patient and insurance preference. Use up to four times daily as directed. (FOR ICD-10 E10.9, E11.9). 1 each 0   buPROPion (WELLBUTRIN XL) 150 MG 24 hr tablet Take 150 mg by mouth in the  morning.     busPIRone (BUSPAR) 7.5 MG tablet Take 7.5 mg by mouth 2 (two) times daily.     Calcium Carb-Cholecalciferol (CALCIUM + D3 PO) Take 1 tablet by mouth daily.     cholecalciferol (VITAMIN D3) 25 MCG (1000 UT) tablet Take 1,000 Units by mouth at bedtime.     clobetasol cream (TEMOVATE) 3.15 % Apply 1 application topically daily.     Cyanocobalamin (VITAMIN B-12 PO) Take 1 capsule by mouth daily.     dicyclomine (BENTYL) 10 MG capsule Take 1 capsule (10 mg total) by mouth 3 (three) times daily before meals. (Patient taking differently: Take 10 mg by mouth 2 (two) times daily as needed (IBS).) 90 capsule 11   doxycycline (VIBRA-TABS) 100 MG tablet Take 1 tablet (100 mg total) by mouth 2 (two) times daily. 14 tablet 0   DULoxetine (CYMBALTA) 60 MG capsule Take 60 mg by mouth in the morning.     Echinacea 400 MG CAPS Take 400 mg by mouth daily.     ezetimibe (ZETIA) 10 MG tablet Take 1 tablet (10 mg total) by mouth daily. (Patient taking differently: Take 10 mg by mouth every other day.) 30 tablet 0   folic acid (FOLVITE) 1 MG tablet Take 2 tablets (2 mg total) by mouth daily. (Patient taking differently: Take 1 mg by mouth in the morning and at bedtime.) 180 tablet 4   gabapentin (NEURONTIN) 600 MG tablet Take 600  mg by mouth 3 (three) times daily.      galantamine (RAZADYNE ER) 16 MG 24 hr capsule Take 16 mg by mouth every morning.     HYDROcodone-acetaminophen (NORCO/VICODIN) 5-325 MG tablet Take 1 tablet by mouth daily as needed (Arthritis).     hydroxychloroquine (PLAQUENIL) 200 MG tablet TAKE 1 TABLET TWICE DAILY FOR RHEUMATOID ARTHRITIS 180 tablet 0   loperamide (IMODIUM A-D) 2 MG tablet Take 2 mg by mouth 4 (four) times daily as needed for diarrhea or loose stools.     meloxicam (MOBIC) 15 MG tablet Take 15 mg by mouth 2 (two) times a week. Wednesdays & Sundays     montelukast (SINGULAIR) 10 MG tablet Take 10 mg by mouth at bedtime.     Multiple Vitamins-Minerals (CENTRUM SILVER PO)  Take 1 tablet by mouth daily.     Multiple Vitamins-Minerals (HAIR SKIN NAILS PO) Take 1 tablet by mouth daily.     Multiple Vitamins-Minerals (ZINC PO) Take 1 tablet by mouth daily.     mupirocin ointment (BACTROBAN) 2 % Apply 1 application topically daily.     Nebivolol HCl 20 MG TABS Take 20 mg by mouth at bedtime.     NEXLETOL 180 MG TABS TAKE 1 TABLET BY MOUTH EVERY DAY 90 tablet 3   nitroGLYCERIN (NITROSTAT) 0.4 MG SL tablet nitroglycerin 0.4 mg sublingual tablet (Patient taking differently: Place 0.4 mg under the tongue every 5 (five) minutes as needed for chest pain.) 25 tablet 11   olmesartan-hydrochlorothiazide (BENICAR HCT) 40-25 MG tablet Take 1 tablet by mouth in the morning.  5   omeprazole (PRILOSEC) 20 MG capsule Take 20 mg by mouth in the morning.     OVER THE COUNTER MEDICATION Take 1 application  by mouth daily as needed (wound care). Swedish bitters     oxybutynin (DITROPAN-XL) 10 MG 24 hr tablet Take 10 mg by mouth daily.     OXYGEN Inhale 2 L into the lungs at bedtime.     Potassium 99 MG TABS Take 99 mg by mouth daily.     Probiotic Product (PROBIOTIC DAILY PO) Take 1 capsule by mouth daily.     psyllium (REGULOID) 0.52 g capsule Take 1.04 g by mouth 2 (two) times daily.     TART CHERRY PO Take 480 mg by mouth 2 (two) times daily.     Current Facility-Administered Medications  Medication Dose Route Frequency Provider Last Rate Last Admin   sodium chloride flush (NS) 0.9 % injection 3 mL  3 mL Intravenous Q12H Martinique, Peter M, MD          REVIEW OF SYSTEMS (Negative unless checked)  Constitutional: [] Weight loss  [] Fever  [] Chills Cardiac: [] Chest pain   [] Chest pressure   [] Palpitations   [] Shortness of breath when laying flat   [] Shortness of breath at rest   [] Shortness of breath with exertion. Vascular:  [] Pain in legs with walking   [] Pain in legs at rest   [] Pain in legs when laying flat   [] Claudication   [] Pain in feet when walking  [] Pain in feet at rest   [] Pain in feet when laying flat   [] History of DVT   [] Phlebitis   [x] Swelling in legs   [x] Varicose veins   [x] Non-healing ulcers Pulmonary:   [] Uses home oxygen   [] Productive cough   [] Hemoptysis   [] Wheeze  [] COPD   [] Asthma Neurologic:  [] Dizziness  [] Blackouts   [] Seizures   [] History of stroke   [] History of TIA  []   Aphasia   [] Temporary blindness   [] Dysphagia   [] Weakness or numbness in arms   [] Weakness or numbness in legs Musculoskeletal:  [] Arthritis   [] Joint swelling   [] Joint pain   [] Low back pain Hematologic:  [] Easy bruising  [] Easy bleeding   [] Hypercoagulable state   [] Anemic  [] Hepatitis Gastrointestinal:  [] Blood in stool   [] Vomiting blood  [] Gastroesophageal reflux/heartburn   [] Abdominal pain Genitourinary:  [] Chronic kidney disease   [] Difficult urination  [] Frequent urination  [] Burning with urination   [] Hematuria Skin:  [] Rashes   [x] Ulcers   [x] Wounds Psychological:  [] History of anxiety   []  History of major depression.    Physical Exam BP (!) 154/74 (BP Location: Left Arm)   Pulse (!) 55   Resp 16   Wt 236 lb (107 kg)   BMI 40.51 kg/m  Gen:  WD/WN, NAD Head: [place]/AT, No temporalis wasting.  Ear/Nose/Throat: Hearing grossly intact, nares w/o erythema or drainage, oropharynx w/o Erythema/Exudate Eyes: Conjunctiva clear, sclera non-icteric  Neck: trachea midline.  No JVD.  Pulmonary:  Good air movement, respirations not labored, no use of accessory muscles  Cardiac: regular but bradycardic Vascular:  Vessel Right Left  Radial Palpable Palpable                          DP 2+ 1+  PT 2+ Trace   Gastrointestinal:. No masses, surgical incisions, or scars. Musculoskeletal: M/S 5/5 throughout.  Extremities without ischemic changes.  No deformity or atrophy.  Left lower extremity significant stasis dermatitis changes throughout the lower leg.  Several small superficial ulcerations on the posterior medial calf area.  No right lower extremity edema, 2+ left  lower extremity edema. Neurologic: Sensation grossly intact in extremities.  Symmetrical.  Speech is fluent. Motor exam as listed above. Psychiatric: Judgment intact, Mood & affect appropriate for pt's clinical situation. Dermatologic: Left posterior calf wounds without erythema or drainage as above    Radiology No results found.  Labs Recent Results (from the past 2160 hour(s))  [facility] DIABETES EYE EXAM     Status: None   Collection Time: 02/22/22 12:00 AM  Result Value Ref Range   HM Diabetic Eye Exam      Assessment/Plan:  Lower limb ulcer, calf, left, limited to breakdown of skin ([place]) She has been getting wraps from her podiatrist and the wound seems to be improving.  I would consider weekly Unna boots and we are happy to do those going forward if need be.  Once we get her wounds healed, we will try to get her in a more stable compression garment and a lymphedema pump.  Hyperlipidemia lipid control important in reducing the progression of atherosclerotic disease. Continue statin therapy   Diabetes mellitus, type II ([place]) blood glucose control important in reducing the progression of atherosclerotic disease. Also, involved in wound healing. On appropriate medications.   Essential hypertension blood pressure control important in reducing the progression of atherosclerotic disease. On appropriate oral medications.   Lymphedema The patient has clearly developed at least stage II lymphedema likely stage III lymphedema now with weeping of the tissue and recurrent ulceration.  She has fixed skin changes and recurring ulcers and swelling refractory to elevation.  Swelling is improved slightly with compression wraps but she has had issues wearing compression socks in the past.  I prescribed her the juxta lite compression system but would think Unna boots until her wounds are healed would be the best option.  She  would clearly benefit from a lymphedema pump and would also benefit from a  referral to the lymphedema clinic once her wounds are healed.  Were also going to get a repeat venous duplex as this has not been done in about 2 years to ensure that no significant venous disease is present that could be contributing to symptoms.  It was discussed with patient if she does not improve her compression she is not going to get her lymphedema under better control.  See her back with a duplex in the next few weeks and we can do the weekly Unna boots if desired.      Leotis Pain 04/01/2022, 12:39 PM   This note was created with Dragon medical transcription system.  Any errors from dictation are unintentional.

## 2022-04-01 NOTE — Assessment & Plan Note (Signed)
blood glucose control important in reducing the progression of atherosclerotic disease. Also, involved in wound healing. On appropriate medications.  

## 2022-04-01 NOTE — Patient Instructions (Signed)
Lymphedema ? ?Lymphedema is swelling that is caused by the abnormal collection of lymph in the tissues under the skin. Lymph is excess fluid from the tissues in your body that is removed through the lymphatic system. This system is part of your body's defense system (immune system) and includes lymph nodes and lymph vessels. The lymph vessels collect and carry the excess fluid, fats, proteins, and waste from the tissues of the body to the bloodstream. This system also works to clean and remove bacteria and waste products from the body. ?Lymphedema occurs when the lymphatic system is blocked. When the lymph vessels or lymph nodes are blocked or damaged, lymph does not drain properly. This causes an abnormal buildup of lymph, which leads to swelling in the affected area. This may include the trunk area, or an arm or leg. Lymphedema cannot be cured by medicines, but various methods can be used to help reduce the swelling. ?What are the causes? ?The cause of this condition depends on the type of lymphedema that you have. ?Primary lymphedema is caused by the absence of lymph vessels or having abnormal lymph vessels at birth. ?Secondary lymphedema occurs when lymph vessels are blocked or damaged. Secondary lymphedema is more common. Common causes of lymph vessel blockage include: ?Skin infection, such as cellulitis. ?Infection by parasites (filariasis). ?Injury. ?Radiation therapy. ?Cancer. ?Formation of scar tissue. ?Surgery. ?What are the signs or symptoms? ?Symptoms of this condition include: ?Swelling of the arm or leg. ?A heavy or tight feeling in the arm or leg. ?Swelling of the feet, toes, or fingers. Shoes or rings may fit more tightly than before. ?Redness of the skin over the affected area. ?Limited movement of the affected limb. ?Sensitivity to touch or discomfort in the affected limb. ?How is this diagnosed? ?This condition may be diagnosed based on: ?Your symptoms and medical history. ?A physical  exam. ?Bioimpedance spectroscopy. In this test, painless electrical currents are used to measure fluid levels in your body. ?Imaging tests, such as: ?MRI. ?CT scan. ?Duplex ultrasound. This test uses sound waves to produce images of the vessels and the blood flow on a screen. ?Lymphoscintigraphy. In this test, a low dose of a radioactive substance is injected to trace the flow of lymph through your lymph vessels. ?Lymphangiography. In this test, a contrast dye is injected into the lymph vessel to help show blockages. ?How is this treated? ? ?If an underlying condition is causing the lymphedema, that condition will be treated. For example, antibiotic medicines may be used to treat an infection. ?Treatment for this condition will depend on the cause of your lymphedema. Treatment may include: ?Complete decongestive therapy (CDT). This is done by a certified lymphedema therapist to reduce fluid congestion. This therapy includes: ?Skin care. ?Compression wrapping of the affected area. ?Manual lymph drainage. This is a special massage technique that promotes lymph drainage out of a limb. ?Specific exercises. Certain exercises can help fluid move out of the affected limb. ?Compression. Various methods may be used to apply pressure to the affected limb to reduce the swelling. They include: ?Wearing compression stockings or sleeves on the affected limb. ?Wrapping the affected limb with special bandages. ?Surgery. This is usually done for severe cases only. For example, surgery may be done if you have trouble moving the limb or if the swelling does not get better with other treatments. ?Follow these instructions at home: ?Self-care ?The affected area is more likely to become injured or infected. Take these steps to help prevent infection: ?Keep the affected   area clean and dry. ?Use approved creams or lotions to keep the skin moisturized. ?Protect your skin from cuts: ?Use gloves while cooking or gardening. ?Do not walk  barefoot. ?If you shave the affected area, use an electric razor. ?Do not wear tight clothes, shoes, or jewelry. ?Eat a healthy diet that includes a lot of fruits and vegetables. ?Activity ?Do exercises as told by your health care provider. ?Do not sit with your legs crossed. ?When possible, keep the affected limb raised (elevated) above the level of your heart. ?Avoid carrying things with an arm that is affected by lymphedema. ?General instructions ?Wear compression stockings or sleeves as told by your health care provider. ?Note any changes in size of the affected limb. You may be instructed to take regular measurements and keep track of them. ?Take over-the-counter and prescription medicines only as told by your health care provider. ?If you were prescribed an antibiotic medicine, take or apply it as told by your health care provider. Do not stop using the antibiotic even if you start to feel better or if your condition improves. ?Do not use heating pads or ice packs on the affected area. ?Avoid having blood draws, IV insertions, or blood pressure checks on the affected limb. ?Keep all follow-up visits. This is important. ?Contact a health care provider if you: ?Continue to have swelling in your limb. ?Have fluid leaking from the skin of your swollen limb. ?Have a cut that does not heal. ?Have redness or pain in the affected area. ?Develop purplish spots, rash, blisters, or sores (lesions) on your affected limb. ?Get help right away if you: ?Have new swelling in your limb that starts suddenly. ?Have shortness of breath or chest pain. ?Have a fever or chills. ?These symptoms may represent a serious problem that is an emergency. Do not wait to see if the symptoms will go away. Get medical help right away. Call your local emergency services (911 in the [place] Do not drive yourself to the hospital. ?Summary ?Lymphedema is swelling that is caused by the abnormal collection of lymph in the tissues under the  skin. ?Lymph is fluid from the tissues in your body that is removed through the lymphatic system. This system collects and carries excess fluid, fats, proteins, and wastes from the tissues of the body to the bloodstream. ?Lymphedema causes swelling, pain, and redness in the affected area. This may include the trunk area, or an arm or leg. ?Treatment for this condition may depend on the cause of your lymphedema. Treatment may include treating the underlying cause, complete decongestive therapy (CDT), compression methods, or surgery. ?This information is not intended to replace advice given to you by your health care provider. Make sure you discuss any questions you have with your health care provider. ?Document Revised: 05/06/2020 Document Reviewed: 05/06/2020 ?[employer]vier Patient Education ? 2023 [employer] ? ?

## 2022-04-01 NOTE — Progress Notes (Addendum)
76 year old female presents the office today for follow-up evaluation of ongoing wound of her left foot, venous stasis.  I cleaned the wound on the left leg with saline.  Unna boot was applied followed cast padding, from the toes to the knee as well as an coban.   Patient states her appointment with vascular was today and they recommended trying an unna boot again.    At next visit we will apply betadine followed by 4x4 gauze, kerlix and an ace wrap. We will alternate applying unna boot to every other visit to decrease the chance of skin irritation per Dr. Leigh Aurora request.    Monitor for any clinical signs or symptoms of infection and directed to call the office immediately should any occur or go to the ER.  Patient verbalized understanding.

## 2022-04-05 ENCOUNTER — Ambulatory Visit: Payer: Medicare HMO | Admitting: Podiatry

## 2022-04-05 DIAGNOSIS — L97329 Non-pressure chronic ulcer of left ankle with unspecified severity: Secondary | ICD-10-CM | POA: Diagnosis not present

## 2022-04-05 DIAGNOSIS — I83023 Varicose veins of left lower extremity with ulcer of ankle: Secondary | ICD-10-CM | POA: Diagnosis not present

## 2022-04-05 DIAGNOSIS — I890 Lymphedema, not elsewhere classified: Secondary | ICD-10-CM

## 2022-04-06 DIAGNOSIS — Z6841 Body Mass Index (BMI) 40.0 and over, adult: Secondary | ICD-10-CM | POA: Diagnosis not present

## 2022-04-06 DIAGNOSIS — M47816 Spondylosis without myelopathy or radiculopathy, lumbar region: Secondary | ICD-10-CM | POA: Diagnosis not present

## 2022-04-06 DIAGNOSIS — M5030 Other cervical disc degeneration, unspecified cervical region: Secondary | ICD-10-CM | POA: Diagnosis not present

## 2022-04-06 DIAGNOSIS — M47812 Spondylosis without myelopathy or radiculopathy, cervical region: Secondary | ICD-10-CM | POA: Diagnosis not present

## 2022-04-06 DIAGNOSIS — Z79899 Other long term (current) drug therapy: Secondary | ICD-10-CM | POA: Diagnosis not present

## 2022-04-06 DIAGNOSIS — Z96653 Presence of artificial knee joint, bilateral: Secondary | ICD-10-CM | POA: Diagnosis not present

## 2022-04-06 DIAGNOSIS — M069 Rheumatoid arthritis, unspecified: Secondary | ICD-10-CM | POA: Diagnosis not present

## 2022-04-08 ENCOUNTER — Ambulatory Visit (INDEPENDENT_AMBULATORY_CARE_PROVIDER_SITE_OTHER): Payer: Medicare HMO

## 2022-04-08 DIAGNOSIS — I83023 Varicose veins of left lower extremity with ulcer of ankle: Secondary | ICD-10-CM

## 2022-04-08 DIAGNOSIS — L97329 Non-pressure chronic ulcer of left ankle with unspecified severity: Secondary | ICD-10-CM

## 2022-04-08 NOTE — Progress Notes (Signed)
76 year old female presents the office today for follow-up evaluation of ongoing wound of her left foot, venous stasis.  I cleaned the wound on the left leg with saline.  Dressing was applied followed by 4 x 4, Kerlix from the toes to the knee as well as an ace wrap.   We will alternate applying unna boot to every other visit to decrease the chance of skin irritation per Dr. Leigh Aurora request.      Monitor for any clinical signs or symptoms of infection and directed to call the office immediately should any occur or go to the ER.  Patient verbalized understanding.

## 2022-04-11 NOTE — Progress Notes (Signed)
Subjective: 76 year old female presents the office today for follow-up evaluation of ongoing wound of her left foot, venous stasis. She has followed up with vascular surgery since I saw her last.  Denies any fevers or chills.  Objective: AAO x3, NAD DP/PT pulses palpable bilaterally, CRT less than 3 seconds Chronic swelling present in the left leg.  On the medial aspect of the calf for 3 punctate superficial wounds .  There is no blistering.  No surrounding erythema, ascending cellulitis.  No fluctuation or crepitation.  No malodor. No pain with calf compression, warmth, erythema  Assessment: Venous insufficiency, lymphedema  Plan: -All treatment options discussed with the patient including all alternatives, risks, complications.  -Wounds were cleaned.  Unna boot was applied today.  Precautions advised and when to remove this.  We will see her back later in the week for reapplication.  Encouraged elevation.  Follow-up with vascular surgery scheduled for ultrasound.   Trula Slade DPM

## 2022-04-12 ENCOUNTER — Ambulatory Visit (INDEPENDENT_AMBULATORY_CARE_PROVIDER_SITE_OTHER): Payer: Medicare HMO | Admitting: Podiatrist

## 2022-04-12 ENCOUNTER — Encounter (INDEPENDENT_AMBULATORY_CARE_PROVIDER_SITE_OTHER): Payer: Medicare HMO | Admitting: Vascular Surgery

## 2022-04-12 DIAGNOSIS — L97329 Non-pressure chronic ulcer of left ankle with unspecified severity: Secondary | ICD-10-CM | POA: Diagnosis not present

## 2022-04-12 DIAGNOSIS — I83023 Varicose veins of left lower extremity with ulcer of ankle: Secondary | ICD-10-CM | POA: Diagnosis not present

## 2022-04-12 MED ORDER — DOXYCYCLINE HYCLATE 100 MG PO TABS: 100.0000 mg | ORAL_TABLET | Freq: Two times a day (BID) | ORAL | 0 refills | Status: DC

## 2022-04-12 NOTE — Progress Notes (Unsigned)
I also saw Bonnita Nasuti along with Joycelyn Schmid and Dr. Amalia Hailey.  Photo taken below.  3 small ulcers have coalseced into one large venous ulcer.  Measuring 2.5 cm in diameter and 0.4cm in depth.       Redness and swelling periwound noted and agree with antibiotics and application of maxsorb and unna boot   Culture also taken today.

## 2022-04-13 ENCOUNTER — Encounter: Payer: Self-pay | Admitting: Podiatrist

## 2022-04-14 DIAGNOSIS — Z79899 Other long term (current) drug therapy: Secondary | ICD-10-CM | POA: Diagnosis not present

## 2022-04-15 ENCOUNTER — Ambulatory Visit (INDEPENDENT_AMBULATORY_CARE_PROVIDER_SITE_OTHER): Payer: Medicare HMO

## 2022-04-15 DIAGNOSIS — L97329 Non-pressure chronic ulcer of left ankle with unspecified severity: Secondary | ICD-10-CM

## 2022-04-15 DIAGNOSIS — I83023 Varicose veins of left lower extremity with ulcer of ankle: Secondary | ICD-10-CM | POA: Diagnosis not present

## 2022-04-15 NOTE — Progress Notes (Signed)
76 year old female presents the office today for follow-up evaluation of ongoing wound of her left foot, venous stasis.  I cleaned the wound on the left leg with saline.  Unna boot was applied followed cast padding, from the toes to the knee as well as an coban.   Patient states her appointment with vascular was today and they recommended trying an unna boot again.    Patient states she is taking doxycycline Rx as directed. Patient denies nausea, vomiting, fever, and chills.    Monitor for any clinical signs or symptoms of infection and directed to call the office immediately should any occur or go to the ER.  Patient verbalized understanding.

## 2022-04-19 ENCOUNTER — Ambulatory Visit: Payer: Medicare HMO | Admitting: Podiatry

## 2022-04-19 DIAGNOSIS — I83023 Varicose veins of left lower extremity with ulcer of ankle: Secondary | ICD-10-CM | POA: Diagnosis not present

## 2022-04-19 DIAGNOSIS — L97329 Non-pressure chronic ulcer of left ankle with unspecified severity: Secondary | ICD-10-CM | POA: Diagnosis not present

## 2022-04-19 DIAGNOSIS — I890 Lymphedema, not elsewhere classified: Secondary | ICD-10-CM | POA: Diagnosis not present

## 2022-04-19 NOTE — Progress Notes (Signed)
Subjective: Chief Complaint  Patient presents with   Leg Problem    Diabetic leg ulcer, medial side, A1c-7.0 BG- doesn't take it, Doing better, some welling and drainage, TX: Doxycyline     76 year old female presents the office today for proper evaluation of lymphedema, wound on the left leg.  Last week she did have some drainage and a wound culture was obtained.  She was started on antibiotics.  I do not see the result of the wound culture yet.  She was started on doxycycline.  Denies any fevers or chills.  Presents today for dressing change.   Objective: AAO x3, NAD DP/PT pulses palpable bilaterally, CRT less than 3 seconds Small superficial skin breakdown present medial aspect the left calf.  Chronic lymphedema, venous changes present.  No erythema warmth or signs of infection. No pain with calf compression, swelling, warmth, erythema  Assessment: Lymphedema, ulcerations left leg  Plan: -All treatment options discussed with the patient including all alternatives, risks, complications.  -The boot was applied today.  Precautions were advised and when to remove this. -Order lymphedema pump- Ms. Salvetti has a long history of persistent, chronic and severe lymphedema greater than 1 year  with skin breakdown and hyperpigmentation. She has tried compression wraps, elevation, exercise over a a several month period with no improvement. Therefore, I am prescribing a lymphedema pump.  -Patient encouraged to call the office with any questions, concerns, change in symptoms.   Trula Slade DPM

## 2022-04-21 ENCOUNTER — Other Ambulatory Visit: Payer: Medicare HMO

## 2022-04-26 ENCOUNTER — Ambulatory Visit (INDEPENDENT_AMBULATORY_CARE_PROVIDER_SITE_OTHER): Payer: Medicare HMO

## 2022-04-26 DIAGNOSIS — L97329 Non-pressure chronic ulcer of left ankle with unspecified severity: Secondary | ICD-10-CM

## 2022-04-26 DIAGNOSIS — I83023 Varicose veins of left lower extremity with ulcer of ankle: Secondary | ICD-10-CM

## 2022-04-26 NOTE — Progress Notes (Signed)
Office Visit Note  Patient: [name]             Date of Birth: Jul 28, 1945           MRN: [id number]             PCP: Jamie Infante, MD Referring: Jamie Infante, MD Visit Date: 05/10/2022 Occupation: '@GUAROCC'$ @  Subjective:  Chronic pain    History of Present Illness: [name] is a 76 y.o. female with history of seronegative rheumatoid arthritis and DDD.  She is taking plaquenil 200 mg 1 tablet by mouth twice daily.  She has been tolerating Plaquenil without any side effects and has not missed any doses recently.  She continues to have chronic pain involving multiple joints.  Her pain has been most severe in her C-spine, thoracic spine, and both hands.  She states that she has significant stiffness in both hands first thing in the morning and has difficulty using her hands at times.  She denies any joint swelling at this time.  She has been taking hydrocodone 1 to 2 tablets daily for pain relief and remains on Cymbalta 60 mg daily.  She is also taking gabapentin as prescribed.  She remains under the care of wound care and has been following up twice a week.  She is awaiting approval for lymphedema pump.  She continues to use a cane to assist with ambulation.  She denies any recent falls.   Activities of Daily Living:  Patient reports morning stiffness for 2 hours.   Patient Reports nocturnal pain.  Difficulty dressing/grooming: Reports Difficulty climbing stairs: Reports Difficulty getting out of chair: Reports Difficulty using hands for taps, buttons, cutlery, and/or writing: Reports  Review of Systems  Constitutional:  Positive for fatigue.  HENT:  Positive for mouth dryness. Negative for mouth sores and nose dryness.   Eyes:  Positive for dryness. Negative for pain and visual disturbance.  Respiratory:  Negative for cough, hemoptysis and difficulty breathing.   Cardiovascular:  Negative for chest pain, palpitations, hypertension and swelling in legs/feet.   Gastrointestinal:  Positive for diarrhea. Negative for blood in stool and constipation.  Endocrine: Negative for increased urination.  Genitourinary:  Positive for involuntary urination. Negative for painful urination.  Musculoskeletal:  Positive for joint pain, gait problem, joint pain, joint swelling, myalgias, muscle weakness, morning stiffness, muscle tenderness and myalgias.  Skin:  Negative for color change, pallor, rash, hair loss, nodules/bumps, skin tightness, ulcers and sensitivity to sunlight.  Allergic/Immunologic: Negative for susceptible to infections.  Neurological:  Positive for headaches. Negative for dizziness, numbness and weakness.  Hematological:  Negative for swollen glands.  Psychiatric/Behavioral:  Positive for depressed mood. Negative for sleep disturbance. The patient is nervous/anxious.     PMFS History:  Patient Active Problem List   Diagnosis Date Noted   Lower limb ulcer, calf, left, limited to breakdown of skin ([place]) 04/01/2022   Lymphedema 04/01/2022   Trigger finger, left middle finger 10/26/2021   Trigger finger, right ring finger 10/26/2021   Trigger finger, right middle finger 10/26/2021   Benign neoplasm of ascending colon    Hx of adenomatous colonic polyps    Occult blood in stools    Unstable angina ([place]) 10/06/2020   Acute on chronic respiratory failure with hypoxia ([place]) 02/28/2019   OSA on CPAP 02/28/2019   Diabetes mellitus type 2, uncontrolled, with complications 26/20/3559   Breast cancer ([place]) 02/27/2019   History of cancer chemotherapy 02/27/2019   History of radiation therapy  02/27/2019   Diabetic neuropathy ([place]) 02/27/2019   HLD (hyperlipidemia) 02/27/2019   Essential hypertension 02/27/2019   QT prolongation 02/27/2019   Pneumonia due to COVID-19 virus 02/24/2019   Diarrhea    Benign neoplasm of cecum    Abdominal pain, epigastric    Gastroesophageal reflux disease    Dependence on nocturnal oxygen therapy 07/10/2017   Open  wound of left lower leg 12/26/2016   Vitamin D deficiency 09/14/2016   History of total knee replacement, bilateral 09/14/2016   DJD (degenerative joint disease), cervical 09/14/2016   Spondylosis of lumbar region without myelopathy or radiculopathy 09/14/2016   High risk medication use 05/20/2016   Spinal stenosis, lumbar region, with neurogenic claudication 09/08/2015    Class: Chronic   OSA (obstructive sleep apnea) 10/17/2013   Dyspnea 09/04/2013   Atypical chest pain 09/04/2013   Morbid obesity ([place]) 03/21/2012   Cancer of lower-outer quadrant of female breast ([place]) 03/15/2012   Diabetes mellitus, type II ([place]) 02/22/2011   Coronary artery disease    Hypertension    Hyperlipidemia    OA (osteoarthritis) of knee     Past Medical History:  Diagnosis Date   Actinic keratosis    Anxiety    Asthma    Albuterol inhaler as needed.Pulmicort neb as needed   Breast cancer ([place])    right - lumpectomy    Chronic back pain    spinal stenosis   Coronary artery disease    COVID    uses o2 since at hs   Dependence on nocturnal oxygen therapy 07/10/2017   Pt uses 2 liters 02 at night    Depression    takes CYmbalta and Wellbutrin daily   Diabetes mellitus    not on any meds/controlled by diet   Dyspnea    with exertion occasionally when lies down   GERD (gastroesophageal reflux disease)    takes Omeprazole daily   Headache    oocasionally   History of kidney stones    Hyperlipidemia    not on any meds   Hypertension    takes Benazepril,Bystolic,and Amlodipine daily   IBS (irritable bowel syndrome)    Joint pain    Joint swelling    Livedoid vasculitis    Nocturia    OA (osteoarthritis) of knee    Other seborrheic keratosis    Oxygen deficiency    2 liters at night per pt for OSA- no cpap    Peripheral edema    takes daily as needed   Peripheral neuropathy    Personal history of chemotherapy    Personal history of radiation therapy    Pneumonia    hx of-several yrs  ago   RA (rheumatoid arthritis) ([place])    Urinary frequency    Urinary urgency    Weakness    numbness and tingling mainly in left leg occasionally in right.Tingling/numbness in hands    Family History  Problem Relation Age of Onset   Heart disease Mother    Hyperlipidemia Mother    Lung cancer Father    Diabetes Father    Hyperlipidemia Father    Heart disease Sister    Hypertension Sister    Hyperlipidemia Sister    Heart disease Brother    Diabetes Brother    Hypertension Brother    Hyperlipidemia Brother    Lung cancer Brother    Heart disease Brother    Hypertension Brother    Hyperlipidemia Brother    Heart disease Brother    Hypertension  Brother    Hyperlipidemia Brother    Heart disease Maternal Aunt    Asthma Maternal Uncle    Heart disease Maternal Uncle    Past Surgical History:  Procedure Laterality Date   BIOPSY  06/24/2021   Procedure: BIOPSY;  Surgeon: Ladene Artist, MD;  Location: [facility];  Service: Endoscopy;;   BREAST LUMPECTOMY  1997   RIGHT BREAST   CARDIAC CATHETERIZATION  04/23/2004   EF 60%   cataract surgery Bilateral    COLONOSCOPY     COLONOSCOPY WITH PROPOFOL N/A 08/07/2017   Procedure: COLONOSCOPY WITH PROPOFOL;  Surgeon: Ladene Artist, MD;  Location: [facility];  Service: Endoscopy;  Laterality: N/A;   COLONOSCOPY WITH PROPOFOL N/A 06/24/2021   Procedure: COLONOSCOPY WITH PROPOFOL;  Surgeon: Ladene Artist, MD;  Location: [facility];  Service: Endoscopy;  Laterality: N/A;   CORONARY ARTERY BYPASS GRAFT  2005   LIMA GRAFT TO LAD, SAPHENOUS VEIN GRAFT TO THE FIRST DIAGONAL, AND LEFT RADIAL ARTERY GRAFT TO THE OM   ESOPHAGOGASTRODUODENOSCOPY (EGD) WITH PROPOFOL N/A 08/07/2017   Procedure: ESOPHAGOGASTRODUODENOSCOPY (EGD) WITH PROPOFOL;  Surgeon: Ladene Artist, MD;  Location: [facility];  Service: Endoscopy;  Laterality: N/A;   FOOT SURGERY Right    JOINT REPLACEMENT     LITHOTRIPSY     LUMBAR LAMINECTOMY/DECOMPRESSION  MICRODISCECTOMY N/A 09/08/2015   Procedure: CENTRAL DECOMPRESSIVE LUMBAR LAMINECTOMIES L3-4, L4-5 AND BILATERAL HEMILAMINECTOMY L5-S1;  Surgeon: Jessy Oto, MD;  Location: [facility];  Service: Orthopedics;  Laterality: N/A;   nodule removed from left elbow     POLYPECTOMY  06/24/2021   Procedure: POLYPECTOMY;  Surgeon: Ladene Artist, MD;  Location: [facility];  Service: Endoscopy;;   RIGHT/LEFT HEART CATH AND CORONARY/GRAFT ANGIOGRAPHY N/A 10/06/2020   Procedure: RIGHT/LEFT HEART CATH AND CORONARY/GRAFT ANGIOGRAPHY;  Surgeon: Martinique, Peter M, MD;  Location: [facility];  Service: Cardiovascular;  Laterality: N/A;   SHOULDER ARTHROSCOPY     TOTAL KNEE ARTHROPLASTY Bilateral    TOTAL SHOULDER ARTHROPLASTY     TRIPLE BYPASS  04/27/05   US ECHOCARDIOGRAPHY  01/06/2009   EF 55-60%   WRIST SURGERY Left    Social History   Social History Narrative   Not on file   Immunization History  Administered Date(s) Administered   Influenza Split 04/24/2013   PFIZER(Purple Top)SARS-COV-2 Vaccination 09/01/2019, 09/25/2019, 04/04/2020   Pneumococcal Conjugate-13 08/26/2016   Zoster Recombinat (Shingrix) 10/11/2017, 12/22/2017     Objective: Vital Signs: BP (!) 168/89 (BP Location: Left Arm, Patient Position: Sitting, Cuff Size: Normal)   Pulse (!) 54   Resp 15   Ht '5\' 4"'$  (1.626 m)   Wt 234 lb 3.2 oz (106.2 kg)   BMI 40.20 kg/m    Physical Exam Vitals and nursing note reviewed.  Constitutional:      Appearance: She is well-developed.  HENT:     Head: Normocephalic and atraumatic.  Eyes:     Conjunctiva/sclera: Conjunctivae normal.  Cardiovascular:     Rate and Rhythm: Normal rate and regular rhythm.     Heart sounds: Normal heart sounds.  Pulmonary:     Effort: Pulmonary effort is normal.     Breath sounds: Normal breath sounds.  Abdominal:     General: Bowel sounds are normal.     Palpations: Abdomen is soft.  Musculoskeletal:     Cervical back: Normal range of motion.   Skin:    General: Skin is warm and dry.     Capillary Refill:  Capillary refill takes less than 2 seconds.  Neurological:     Mental Status: She is alert and oriented to person, place, and time.  Psychiatric:        Behavior: Behavior normal.      Musculoskeletal Exam: C-spine has limited ROM with lateral rotation.  Trapezius muscle tension tenderness bilaterally.  Postural thoracic kyphosis noted.  Painful range of motion of the lumbar spine.  Right shoulder has significantly limited range of motion especially with internal rotation and abduction.  Elbow joints have good range of motion with no tenderness or synovitis.  Rheumatoid nodule palpable on the extensor surface of the left elbow.  Wrist joints have good range of motion.  Some thickening over MCP joints.  Left middle trigger finger noted.  PIP and DIP thickening consistent with osteoarthritis of both hands.  Hip joints have good range of motion with no groin pain.  Bilateral knee replacements have good range of motion.  Pedal edema noted bilaterally, left ankle greater than right.  CDAI Exam: CDAI Score: -- Patient Global: 5 mm; Provider Global: 5 mm Swollen: --; Tender: -- Joint Exam 05/10/2022   No joint exam has been documented for this visit   There is currently no information documented on the homunculus. Go to the Rheumatology activity and complete the homunculus joint exam.  Investigation: No additional findings.  Imaging: VAS [place] LOWER EXTREMITY VENOUS REFLUX  Result Date: 05/02/2022  Lower Venous Reflux Study Patient Name:  MARQUIS DILES  Date of Exam:   04/28/2022 Medical Rec #: [id number]       Accession #:    [id number] Date of Birth: 09-Apr-1946        Patient Gender: F Patient Age:   3 years Exam Location:  [facility] Procedure:      VAS [place] LOWER EXTREMITY VENOUS REFLUX Referring Phys: Leotis Pain --------------------------------------------------------------------------------  Indications: Varicosities,  and left. Other Indications: Hx of left GSV, LSV ablations many years ago. Performing Technologist: Concha Norway RVT  Examination Guidelines: A complete evaluation includes B-mode imaging, spectral Doppler, color Doppler, and power Doppler as needed of all accessible portions of each vessel. Bilateral testing is considered an integral part of a complete examination. Limited examinations for reoccurring indications may be performed as noted. The reflux portion of the exam is performed with the patient in reverse Trendelenburg. Significant venous reflux is defined as >500 ms in the superficial venous system, and >1 second in the deep venous system.   Summary: Left: - No evidence of deep vein thrombosis seen in the left lower extremity, from the common femoral through the popliteal veins. - No evidence of superficial venous thrombosis in the left lower extremity. - There is no evidence of venous reflux seen in the left lower extremity. - Left GSV and LSV ablated. No prominant anterior accesory seen.  *See table(s) above for measurements and observations. Electronically signed by Leotis Pain MD on 05/02/2022 at 8:38:36 AM.    Final     Recent Labs: Lab Results  Component Value Date   WBC 7.5 12/07/2021   HGB 13.1 12/07/2021   PLT 208 12/07/2021   NA 140 12/07/2021   K 3.9 12/07/2021   CL 101 12/07/2021   CO2 29 12/07/2021   GLUCOSE 137 (H) 12/07/2021   BUN 18 12/07/2021   CREATININE 0.53 (L) 12/07/2021   BILITOT 0.5 12/07/2021   ALKPHOS 54 10/02/2020   AST 20 12/07/2021   ALT 18 12/07/2021   PROT 6.8 12/07/2021  ALBUMIN 4.6 10/02/2020   CALCIUM 9.4 12/07/2021   GFRAA 104 03/02/2020    Speciality Comments: PLQ Eye Exam: 07/29/2021 WNL @ [facility]   Follow up in 6 months  November 18, 2020 high-resolution CT-according to Dr. Elsworth Soho lung functions were preserved and no immunosuppressive therapy needed.  Procedures:  No procedures performed Allergies: Statins, Alirocumab, Aricept  [donepezil], Atorvastatin, Crestor [rosuvastatin], Diazepam, Dry skin treatment [albolene], Evolocumab, Ezetimibe, Fenofibrate micronized, Fentanyl, Loratadine, Metformin, Morphine and related, Niaspan [niacin], Phentermine-topiramate, Welchol [colesevelam], Zocor [simvastatin], and Azithromycin   Assessment / Plan:     Visit Diagnoses: Rheumatoid arthritis of multiple sites with negative rheumatoid factor ([place]): She has no synovitis on examination today.  She continues to have chronic pain involving multiple joints including her C-spine, thoracic spine, both hands, and both feet.  She attributes most of her discomfort in her feet due to underlying neuropathy.  She has not noticed any increased joint inflammation.  She has been taking hydrocodone 1-2 times daily for pain relief and Tylenol at bedtime.  She remains on Plaquenil 200 mg 1 tablet by mouth twice daily.  She is tolerating Plaquenil without any side effects and has not missed any doses recently.  She is not a good candidate for more aggressive immunosuppression at this time due to continuing to follow-up closely with wound care for a nonhealing ulcer on her left lower extremity. No medication changes will be made at this time.  She was advised to notify us if she develops increased joint pain or joint swelling.  She will follow-up in the office in 5 months or sooner if needed.  High risk medication use - Plaquenil 200 mg 1 tablet by mouth twice daily.  (MTX was discontinued in July 2019 due to lower extremity ulcers).   PLQ Eye Exam: 07/29/2021 WNL @ [facility].  CBC and CMP updated on 01/26/22. Order for CBC and CMP released today.   - Plan: CBC with Differential/Platelet, COMPLETE METABOLIC PANEL WITH GFR  Pain in both hands: Patient continues to have chronic pain in both hands.  On examination no synovitis was noted.  She has PIP and DIP thickening consistent with osteoarthritic changes.  Some thickening over MCP joints but no active  synovitis.  Discussed the importance of joint protection and muscle strengthening.  She has been taking hydrocodone 1 to 2 tablets daily for pain relief and Tylenol at bedtime.  History of total knee replacement, bilateral: Doing well.  Good range of motion of both knee replacements.  No warmth or effusion noted on examination today.  DDD (degenerative disc disease), cervical - She is followed by Dr. Louanne Skye.  Limited range of motion with lateral rotation especially to the left.  No symptoms of radiculopathy at this time.  DDD (degenerative disc disease), lumbar - X-rays of lumbar spine on 08/19/19 consistent with multilevel spondylosis and DDD.  She is followed by Dr. Louanne Skye and Dr. Ernestina Patches. Chronic pain.  Painful range of motion.  Ulcer of left lower extremity, limited to breakdown of skin ([facility]) - Non-healing x3 years+.  She is followed by Dr. Jacqualyn Posey. She has been going to wound care twice a week.  According to the patient the wound has started to gradually improve.  She is currently awaiting approval for lymphedema pump. She is not a good candidate for more aggressive immunosuppressive therapy at this time.  Varicose veins of bilateral lower extremities with other complications - Followed by Dr. Donzetta Matters (vascular surgery).  Patient is waiting on approval  for lymphedema pump.  Other medical conditions are listed as follows:  History of hypertension  History of diabetes mellitus  History of coronary artery disease  Pedal edema  History of sleep apnea  History of vitamin D deficiency  History of breast cancer  Orders: Orders Placed This Encounter  Procedures   CBC with Differential/Platelet   COMPLETE METABOLIC PANEL WITH GFR   No orders of the defined types were placed in this encounter.    Follow-Up Instructions: Return in about 5 months (around 10/09/2022) for Rheumatoid arthritis.   Ofilia Neas, PA-C  Note - This record has been created using Dragon software.  Chart  creation errors have been sought, but may not always  have been located. Such creation errors do not reflect on  the standard of medical care.

## 2022-04-26 NOTE — Progress Notes (Signed)
76 year old female presents the office today for follow-up evaluation of ongoing wound of her left foot, venous stasis.  Unna boot was applied followed cast padding, from the toes to the knee as well as an coban.    Patient states she is taking doxycycline Rx as directed. Patient denies nausea, vomiting, fever, and chills.    Monitor for any clinical signs or symptoms of infection and directed to call the office immediately should any occur or go to the ER.  Patient verbalized understanding.

## 2022-04-28 ENCOUNTER — Ambulatory Visit (INDEPENDENT_AMBULATORY_CARE_PROVIDER_SITE_OTHER): Payer: Medicare HMO | Admitting: Nurse Practitioner

## 2022-04-28 ENCOUNTER — Ambulatory Visit (INDEPENDENT_AMBULATORY_CARE_PROVIDER_SITE_OTHER): Payer: Medicare HMO

## 2022-04-28 ENCOUNTER — Encounter (INDEPENDENT_AMBULATORY_CARE_PROVIDER_SITE_OTHER): Payer: Self-pay | Admitting: Nurse Practitioner

## 2022-04-28 VITALS — BP 155/82 | HR 61 | Resp 18 | Ht 64.0 in | Wt 234.0 lb

## 2022-04-28 DIAGNOSIS — L97221 Non-pressure chronic ulcer of left calf limited to breakdown of skin: Secondary | ICD-10-CM

## 2022-04-28 DIAGNOSIS — I890 Lymphedema, not elsewhere classified: Secondary | ICD-10-CM

## 2022-04-28 DIAGNOSIS — I10 Essential (primary) hypertension: Secondary | ICD-10-CM | POA: Diagnosis not present

## 2022-04-29 ENCOUNTER — Encounter (INDEPENDENT_AMBULATORY_CARE_PROVIDER_SITE_OTHER): Payer: Self-pay | Admitting: Nurse Practitioner

## 2022-04-29 NOTE — Progress Notes (Signed)
Subjective:    Patient ID: [name], female    DOB: Jul 22, 1946, 76 y.o.   MRN: [id number] No chief complaint on file.   [name] is a 76 y.o. female.  The patient returns today for follow-up evaluation of her lower extremity swelling and recurrent ulcerations.  The patient has previously had treatment of her varicose veins in her left lower extremity.  She notes that she is not able to wear compression because she feels that they made her legs breakout.  Compliant with leg elevation somewhat.  The patient also notes that she has trauma and fracture many years ago in the left lower extremity.  No previous DVT.  She notes that the swelling has been ongoing for years.  She recently developed ulcerations and she has been having this done by her podiatrist.  Noninvasive studies show no evidence of DVT or superficial thrombophlebitis bilaterally.    Review of Systems  Cardiovascular:  Positive for leg swelling.  Skin:  Positive for wound.  All other systems reviewed and are negative.      Objective:   Physical Exam Vitals reviewed.  HENT:     Head: Normocephalic.  Cardiovascular:     Rate and Rhythm: Normal rate.     Pulses: Normal pulses.  Pulmonary:     Effort: Pulmonary effort is normal.  Musculoskeletal:     Left lower leg: Edema present.  Skin:    General: Skin is warm and dry.  Neurological:     Mental Status: She is alert and oriented to person, place, and time.  Psychiatric:        Mood and Affect: Mood normal.        Behavior: Behavior normal.        Thought Content: Thought content normal.        Judgment: Judgment normal.     BP (!) 155/82 (BP Location: Left Arm)   Pulse 61   Resp 18   Ht _0  (1.626 m)   Wt 234 lb (106.1 kg)   BMI 40.17 kg/m   Past Medical History:  Diagnosis Date   Actinic keratosis    Anxiety    Asthma    Albuterol inhaler as needed.Pulmicort neb as needed   Breast cancer ([place])    right - lumpectomy    Chronic back pain     spinal stenosis   Coronary artery disease    COVID    uses o2 since at hs   Dependence on nocturnal oxygen therapy 07/10/2017   Pt uses 2 liters 02 at night    Depression    takes CYmbalta and Wellbutrin daily   Diabetes mellitus    not on any meds/controlled by diet   Dyspnea    with exertion occasionally when lies down   GERD (gastroesophageal reflux disease)    takes Omeprazole daily   Headache    oocasionally   History of kidney stones    Hyperlipidemia    not on any meds   Hypertension    takes Benazepril,Bystolic,and Amlodipine daily   IBS (irritable bowel syndrome)    Joint pain    Joint swelling    Livedoid vasculitis    Nocturia    OA (osteoarthritis) of knee    Other seborrheic keratosis    Oxygen deficiency    2 liters at night per pt for OSA- no cpap    Peripheral edema    takes daily as needed   Peripheral neuropathy  Personal history of chemotherapy    Personal history of radiation therapy    Pneumonia    hx of-several yrs ago   RA (rheumatoid arthritis) ([place])    Urinary frequency    Urinary urgency    Weakness    numbness and tingling mainly in left leg occasionally in right.Tingling/numbness in hands    Social History   Socioeconomic History   Marital status: Widowed    Spouse name: Not on file   Number of children: 2   Years of education: Not on file   Highest education level: Not on file  Occupational History   Occupation: retired    Fish farm manager: UNEMPLOYED  Tobacco Use   Smoking status: Never    Passive exposure: Never   Smokeless tobacco: Never  Vaping Use   Vaping Use: Never used  Substance and Sexual Activity   Alcohol use: No    Alcohol/week: 0.0 standard drinks of alcohol   Drug use: Never   Sexual activity: Not on file  Other Topics Concern   Not on file  Social History Narrative   Not on file   Social Determinants of Health   Financial Resource Strain: Not on file  Food Insecurity: Not on file  Transportation  Needs: Not on file  Physical Activity: Not on file  Stress: Not on file  Social Connections: Not on file  Intimate Partner Violence: Not on file    Past Surgical History:  Procedure Laterality Date   BIOPSY  06/24/2021   Procedure: BIOPSY;  Surgeon: Ladene Artist, MD;  Location: [facility];  Service: Endoscopy;;   BREAST LUMPECTOMY  1997   RIGHT BREAST   CARDIAC CATHETERIZATION  04/23/2004   EF 60%   cataract surgery Bilateral    COLONOSCOPY     COLONOSCOPY WITH PROPOFOL N/A 08/07/2017   Procedure: COLONOSCOPY WITH PROPOFOL;  Surgeon: Ladene Artist, MD;  Location: [facility];  Service: Endoscopy;  Laterality: N/A;   COLONOSCOPY WITH PROPOFOL N/A 06/24/2021   Procedure: COLONOSCOPY WITH PROPOFOL;  Surgeon: Ladene Artist, MD;  Location: [facility];  Service: Endoscopy;  Laterality: N/A;   CORONARY ARTERY BYPASS GRAFT  2005   LIMA GRAFT TO LAD, SAPHENOUS VEIN GRAFT TO THE FIRST DIAGONAL, AND LEFT RADIAL ARTERY GRAFT TO THE OM   ESOPHAGOGASTRODUODENOSCOPY (EGD) WITH PROPOFOL N/A 08/07/2017   Procedure: ESOPHAGOGASTRODUODENOSCOPY (EGD) WITH PROPOFOL;  Surgeon: Ladene Artist, MD;  Location: [facility];  Service: Endoscopy;  Laterality: N/A;   FOOT SURGERY Right    JOINT REPLACEMENT     LITHOTRIPSY     LUMBAR LAMINECTOMY/DECOMPRESSION MICRODISCECTOMY N/A 09/08/2015   Procedure: CENTRAL DECOMPRESSIVE LUMBAR LAMINECTOMIES L3-4, L4-5 AND BILATERAL HEMILAMINECTOMY L5-S1;  Surgeon: Jessy Oto, MD;  Location: [place];  Service: Orthopedics;  Laterality: N/A;   nodule removed from left elbow     POLYPECTOMY  06/24/2021   Procedure: POLYPECTOMY;  Surgeon: Ladene Artist, MD;  Location: [facility];  Service: Endoscopy;;   RIGHT/LEFT HEART CATH AND CORONARY/GRAFT ANGIOGRAPHY N/A 10/06/2020   Procedure: RIGHT/LEFT HEART CATH AND CORONARY/GRAFT ANGIOGRAPHY;  Surgeon: Martinique, Peter M, MD;  Location: [facility];  Service: Cardiovascular;  Laterality: N/A;   SHOULDER ARTHROSCOPY      TOTAL KNEE ARTHROPLASTY Bilateral    TOTAL SHOULDER ARTHROPLASTY     TRIPLE BYPASS  04/27/05   US ECHOCARDIOGRAPHY  01/06/2009   EF 55-60%   WRIST SURGERY Left     Family History  Problem Relation Age of Onset  Heart disease Mother    Hyperlipidemia Mother    Lung cancer Father    Diabetes Father    Hyperlipidemia Father    Heart disease Sister    Hypertension Sister    Hyperlipidemia Sister    Heart disease Brother    Diabetes Brother    Hypertension Brother    Hyperlipidemia Brother    Lung cancer Brother    Heart disease Brother    Hypertension Brother    Hyperlipidemia Brother    Heart disease Brother    Hypertension Brother    Hyperlipidemia Brother    Heart disease Maternal Aunt    Asthma Maternal Uncle    Heart disease Maternal Uncle     Allergies  Allergen Reactions   Statins Other (See Comments)    Joint pain and swelling/burning   Alirocumab Other (See Comments)    (praluent) all sorts of side effects. strange.  she had burning, weakness, felt joints hurt more, felt asthma flared.   Aricept [Donepezil]     leg pains   Atorvastatin     Leg pain   Crestor [Rosuvastatin]     Other reaction(s): even 1/2 of 5 mg 2-3 days a week couldn't take due to muscle side effects.   Diazepam     "makes me crazy"   Dry Skin Treatment [Albolene]     Other reaction(s): contact dermatitis when in covid hospital   Evolocumab     (Repatha) felt like going to pass out.legs were weak.   Ezetimibe     leg cramps   Fenofibrate Micronized     cramps   Fentanyl Other (See Comments)    loopy   Loratadine     leg cramps   Metformin     upset stomach   Morphine And Related Other (See Comments)    hallucinations   Niaspan [Niacin]     burning   Phentermine-Topiramate     Other reaction(s): just didn't work for her.   Welchol [Colesevelam]     Leg cramps   Zocor [Simvastatin]     muscle aches   Azithromycin Rash    Legs burning        Latest Ref Rng & Units  12/07/2021   11:59 AM 07/08/2021   11:10 AM 02/04/2021   11:43 AM  CBC  WBC 3.8 - 10.8 Thousand/uL 7.5  6.8  6.6   Hemoglobin 11.7 - 15.5 g/dL 13.1  13.3  13.3   Hematocrit 35.0 - 45.0 % 40.1  40.5  40.3   Platelets 140 - 400 Thousand/uL 208  178  182       CMP     Component Value Date/Time   NA 140 12/07/2021 1159   NA 142 11/13/2020 1146   NA 142 12/29/2016 1140   K 3.9 12/07/2021 1159   K 3.7 12/29/2016 1140   CL 101 12/07/2021 1159   CO2 29 12/07/2021 1159   CO2 29 12/29/2016 1140   GLUCOSE 137 (H) 12/07/2021 1159   GLUCOSE 146 (H) 12/29/2016 1140   BUN 18 12/07/2021 1159   BUN 18 11/13/2020 1146   BUN 16.3 12/29/2016 1140   CREATININE 0.53 (L) 12/07/2021 1159   CREATININE 0.7 12/29/2016 1140   CALCIUM 9.4 12/07/2021 1159   CALCIUM 9.9 12/29/2016 1140   PROT 6.8 12/07/2021 1159   PROT 7.2 10/02/2020 1131   PROT 7.2 12/29/2016 1140   ALBUMIN 4.6 10/02/2020 1131   ALBUMIN 4.1 12/29/2016 1140   AST 20 12/07/2021 1159  AST 27 01/04/2018 1032   AST 23 12/29/2016 1140   ALT 18 12/07/2021 1159   ALT 31 01/04/2018 1032   ALT 23 12/29/2016 1140   ALKPHOS 54 10/02/2020 1131   ALKPHOS 61 12/29/2016 1140   BILITOT 0.5 12/07/2021 1159   BILITOT 0.4 10/02/2020 1131   BILITOT 0.4 01/04/2018 1032   BILITOT 0.52 12/29/2016 1140   GFRNONAA 90 03/02/2020 1235   GFRAA 104 03/02/2020 1235     No results found.     Assessment & Plan:   1. Lower limb ulcer, calf, left, limited to breakdown of skin ([place]) The patient's edema has been drastically improving with use of Unna boots.  Patient is advised to continue with Unna boots as directed by her podiatrist. - VAS [place] LOWER EXTREMITY VENOUS REFLUX  2. Lymphedema The patient has upcoming appointment with lymphedema clinic.  We discussed lymphedema and its progression.  Very frank discussion and that lymphedema is a frustrating condition as there is no known cure.  There is also no known medication to reduce lymphedema.  The  best way to treat this is with adherence to conservative therapy tactics including use of medical grade 1 compression stockings daily.  She should not sleep in these.  Elevation of the lower extremities and  in addition to exercise.  Nonadherence to conservative therapies will result in worsening and continued lymphedema.  She is advised to follow-up with [place] in 3 months. - VAS [place] LOWER EXTREMITY VENOUS REFLUX  3. Essential hypertension Continue antihypertensive medications as already ordered, these medications have been reviewed and there are no changes at this time.    Current Outpatient Medications on File Prior to Visit  Medication Sig Dispense Refill   ACCU-CHEK GUIDE test strip      acetaminophen (TYLENOL) 500 MG tablet Take 1,000 mg by mouth at bedtime.     albuterol (VENTOLIN HFA) 108 (90 Base) MCG/ACT inhaler Inhale 2 puffs into the lungs every 4 (four) hours as needed for wheezing or shortness of breath (cough, shortness of breath or wheezing.). 6.7 g 0   amLODipine (NORVASC) 5 MG tablet Take 5 mg by mouth in the morning.     aspirin EC 81 MG tablet Take 81 mg by mouth at bedtime. Swallow whole.     b complex vitamins capsule Take 1 capsule by mouth daily.     baclofen (LIORESAL) 10 MG tablet TAKE 1 TABLET BY MOUTH TWICE A DAY AS NEEDED FOR SPASMS (Patient taking differently: Take 10 mg by mouth 2 (two) times daily.) 180 tablet 0   blood glucose meter kit and supplies Dispense based on patient and insurance preference. Use up to four times daily as directed. (FOR ICD-10 E10.9, E11.9). 1 each 0   buPROPion (WELLBUTRIN XL) 150 MG 24 hr tablet Take 150 mg by mouth in the morning.     busPIRone (BUSPAR) 7.5 MG tablet Take 7.5 mg by mouth 2 (two) times daily.     Calcium Carb-Cholecalciferol (CALCIUM + D3 PO) Take 1 tablet by mouth daily.     cholecalciferol (VITAMIN D3) 25 MCG (1000 UT) tablet Take 1,000 Units by mouth at bedtime.     clobetasol cream (TEMOVATE) 6.73 % Apply 1 application  topically daily.     Cyanocobalamin (VITAMIN B-12 PO) Take 1 capsule by mouth daily.     dicyclomine (BENTYL) 10 MG capsule Take 1 capsule (10 mg total) by mouth 3 (three) times daily before meals. (Patient taking differently: Take 10 mg by mouth  2 (two) times daily as needed (IBS).) 90 capsule 11   doxycycline (VIBRA-TABS) 100 MG tablet Take 1 tablet (100 mg total) by mouth 2 (two) times daily. 14 tablet 0   DULoxetine (CYMBALTA) 60 MG capsule Take 60 mg by mouth in the morning.     Echinacea 400 MG CAPS Take 400 mg by mouth daily.     ezetimibe (ZETIA) 10 MG tablet Take 1 tablet (10 mg total) by mouth daily. (Patient taking differently: Take 10 mg by mouth every other day.) 30 tablet 0   folic acid (FOLVITE) 1 MG tablet Take 2 tablets (2 mg total) by mouth daily. (Patient taking differently: Take 1 mg by mouth in the morning and at bedtime.) 180 tablet 4   gabapentin (NEURONTIN) 600 MG tablet Take 600 mg by mouth 3 (three) times daily.      galantamine (RAZADYNE ER) 16 MG 24 hr capsule Take 16 mg by mouth every morning.     HYDROcodone-acetaminophen (NORCO/VICODIN) 5-325 MG tablet Take 1 tablet by mouth daily as needed (Arthritis).     hydroxychloroquine (PLAQUENIL) 200 MG tablet TAKE 1 TABLET TWICE DAILY FOR RHEUMATOID ARTHRITIS 180 tablet 0   loperamide (IMODIUM A-D) 2 MG tablet Take 2 mg by mouth 4 (four) times daily as needed for diarrhea or loose stools.     meloxicam (MOBIC) 15 MG tablet Take 15 mg by mouth 2 (two) times a week. Wednesdays & Sundays     montelukast (SINGULAIR) 10 MG tablet Take 10 mg by mouth at bedtime.     Multiple Vitamins-Minerals (CENTRUM SILVER PO) Take 1 tablet by mouth daily.     Multiple Vitamins-Minerals (HAIR SKIN NAILS PO) Take 1 tablet by mouth daily.     Multiple Vitamins-Minerals (ZINC PO) Take 1 tablet by mouth daily.     mupirocin ointment (BACTROBAN) 2 % Apply 1 application topically daily.     Nebivolol HCl 20 MG TABS Take 20 mg by mouth at bedtime.      NEXLETOL 180 MG TABS TAKE 1 TABLET BY MOUTH EVERY DAY 90 tablet 3   nitroGLYCERIN (NITROSTAT) 0.4 MG SL tablet nitroglycerin 0.4 mg sublingual tablet (Patient taking differently: Place 0.4 mg under the tongue every 5 (five) minutes as needed for chest pain.) 25 tablet 11   olmesartan-hydrochlorothiazide (BENICAR HCT) 40-25 MG tablet Take 1 tablet by mouth in the morning.  5   omeprazole (PRILOSEC) 20 MG capsule Take 20 mg by mouth in the morning.     OVER THE COUNTER MEDICATION Take 1 application  by mouth daily as needed (wound care). Swedish bitters     oxybutynin (DITROPAN-XL) 10 MG 24 hr tablet Take 10 mg by mouth daily.     OXYGEN Inhale 2 L into the lungs at bedtime.     Potassium 99 MG TABS Take 99 mg by mouth daily.     Probiotic Product (PROBIOTIC DAILY PO) Take 1 capsule by mouth daily.     psyllium (REGULOID) 0.52 g capsule Take 1.04 g by mouth 2 (two) times daily.     TART CHERRY PO Take 480 mg by mouth 2 (two) times daily.     Current Facility-Administered Medications on File Prior to Visit  Medication Dose Route Frequency Provider Last Rate Last Admin   sodium chloride flush (NS) 0.9 % injection 3 mL  3 mL Intravenous Q12H Martinique, Peter M, MD        There are no Patient Instructions on file for this visit. No follow-ups on  file.   Kris Hartmann, NP

## 2022-05-03 ENCOUNTER — Other Ambulatory Visit: Payer: Self-pay

## 2022-05-06 ENCOUNTER — Ambulatory Visit (INDEPENDENT_AMBULATORY_CARE_PROVIDER_SITE_OTHER): Payer: Medicare HMO

## 2022-05-06 DIAGNOSIS — M069 Rheumatoid arthritis, unspecified: Secondary | ICD-10-CM | POA: Diagnosis not present

## 2022-05-06 DIAGNOSIS — M47812 Spondylosis without myelopathy or radiculopathy, cervical region: Secondary | ICD-10-CM | POA: Diagnosis not present

## 2022-05-06 DIAGNOSIS — M5030 Other cervical disc degeneration, unspecified cervical region: Secondary | ICD-10-CM | POA: Diagnosis not present

## 2022-05-06 DIAGNOSIS — L97329 Non-pressure chronic ulcer of left ankle with unspecified severity: Secondary | ICD-10-CM

## 2022-05-06 DIAGNOSIS — M47816 Spondylosis without myelopathy or radiculopathy, lumbar region: Secondary | ICD-10-CM | POA: Diagnosis not present

## 2022-05-06 DIAGNOSIS — I83023 Varicose veins of left lower extremity with ulcer of ankle: Secondary | ICD-10-CM | POA: Diagnosis not present

## 2022-05-06 NOTE — Progress Notes (Signed)
76 year old female presents the office today for follow-up evaluation of ongoing wound of her left foot, venous stasis.  Unna boot was applied followed cast padding, from the toes to the knee as well as an coban.    Patient denies nausea, vomiting, fever, and chills.    Monitor for any clinical signs or symptoms of infection and directed to call the office immediately should any occur or go to the ER.  Patient verbalized understanding.  Patient to return in 5 days for reapplication of unna boot.

## 2022-05-10 ENCOUNTER — Ambulatory Visit: Payer: Medicare HMO | Attending: Physician Assistant | Admitting: Physician Assistant

## 2022-05-10 ENCOUNTER — Encounter: Payer: Self-pay | Admitting: Physician Assistant

## 2022-05-10 ENCOUNTER — Ambulatory Visit (INDEPENDENT_AMBULATORY_CARE_PROVIDER_SITE_OTHER): Payer: Medicare HMO

## 2022-05-10 VITALS — BP 168/89 | HR 54 | Resp 15 | Ht 64.0 in | Wt 234.2 lb

## 2022-05-10 DIAGNOSIS — M5136 Other intervertebral disc degeneration, lumbar region: Secondary | ICD-10-CM | POA: Diagnosis not present

## 2022-05-10 DIAGNOSIS — L97921 Non-pressure chronic ulcer of unspecified part of left lower leg limited to breakdown of skin: Secondary | ICD-10-CM

## 2022-05-10 DIAGNOSIS — I83023 Varicose veins of left lower extremity with ulcer of ankle: Secondary | ICD-10-CM

## 2022-05-10 DIAGNOSIS — I83893 Varicose veins of bilateral lower extremities with other complications: Secondary | ICD-10-CM | POA: Diagnosis not present

## 2022-05-10 DIAGNOSIS — M5030 Other cervical disc degeneration, unspecified cervical region: Secondary | ICD-10-CM | POA: Diagnosis not present

## 2022-05-10 DIAGNOSIS — Z96653 Presence of artificial knee joint, bilateral: Secondary | ICD-10-CM

## 2022-05-10 DIAGNOSIS — M79641 Pain in right hand: Secondary | ICD-10-CM

## 2022-05-10 DIAGNOSIS — Z853 Personal history of malignant neoplasm of breast: Secondary | ICD-10-CM

## 2022-05-10 DIAGNOSIS — Z8679 Personal history of other diseases of the circulatory system: Secondary | ICD-10-CM

## 2022-05-10 DIAGNOSIS — M0609 Rheumatoid arthritis without rheumatoid factor, multiple sites: Secondary | ICD-10-CM

## 2022-05-10 DIAGNOSIS — Z8639 Personal history of other endocrine, nutritional and metabolic disease: Secondary | ICD-10-CM

## 2022-05-10 DIAGNOSIS — M79642 Pain in left hand: Secondary | ICD-10-CM

## 2022-05-10 DIAGNOSIS — L97329 Non-pressure chronic ulcer of left ankle with unspecified severity: Secondary | ICD-10-CM

## 2022-05-10 DIAGNOSIS — Z8669 Personal history of other diseases of the nervous system and sense organs: Secondary | ICD-10-CM

## 2022-05-10 DIAGNOSIS — R600 Localized edema: Secondary | ICD-10-CM

## 2022-05-10 DIAGNOSIS — Z79899 Other long term (current) drug therapy: Secondary | ICD-10-CM | POA: Diagnosis not present

## 2022-05-10 NOTE — Progress Notes (Signed)
76 year old female presents the office today for follow-up evaluation of ongoing wound of her left foot, venous stasis.  Unna boot was applied followed cast padding, from the toes to the knee as well as an coban.    Patient denies nausea, vomiting, fever, and chills.    Monitor for any clinical signs or symptoms of infection and directed to call the office immediately should any occur or go to the ER.  Patient verbalized understanding.  Patient to return in 3 days for reapplication of unna boot.

## 2022-05-11 ENCOUNTER — Other Ambulatory Visit: Payer: Self-pay | Admitting: Gastroenterology

## 2022-05-11 LAB — COMPLETE METABOLIC PANEL WITH GFR
AG Ratio: 1.9 (calc) (ref 1.0–2.5)
ALT: 21 U/L (ref 6–29)
AST: 30 U/L (ref 10–35)
Albumin: 4.7 g/dL (ref 3.6–5.1)
Alkaline phosphatase (APISO): 42 U/L (ref 37–153)
BUN/Creatinine Ratio: 30 (calc) — ABNORMAL HIGH (ref 6–22)
BUN: 17 mg/dL (ref 7–25)
CO2: 24 mmol/L (ref 20–32)
Calcium: 9.7 mg/dL (ref 8.6–10.4)
Chloride: 101 mmol/L (ref 98–110)
Creat: 0.56 mg/dL — ABNORMAL LOW (ref 0.60–1.00)
Globulin: 2.5 g/dL (calc) (ref 1.9–3.7)
Glucose, Bld: 128 mg/dL — ABNORMAL HIGH (ref 65–99)
Potassium: 4.7 mmol/L (ref 3.5–5.3)
Sodium: 140 mmol/L (ref 135–146)
Total Bilirubin: 0.5 mg/dL (ref 0.2–1.2)
Total Protein: 7.2 g/dL (ref 6.1–8.1)
eGFR: 94 mL/min/{1.73_m2} (ref >=60)

## 2022-05-11 LAB — CBC WITH DIFFERENTIAL/PLATELET
Absolute Monocytes: 697 cells/uL (ref 200–950)
Basophils Absolute: 42 cells/uL (ref 0–200)
Basophils Relative: 0.5 %
Eosinophils Absolute: 100 cells/uL (ref 15–500)
Eosinophils Relative: 1.2 %
HCT: 41.5 % (ref 35.0–45.0)
Hemoglobin: 14 g/dL (ref 11.7–15.5)
Lymphs Abs: 3445 cells/uL (ref 850–3900)
MCH: 30.2 pg (ref 27.0–33.0)
MCHC: 33.7 g/dL (ref 32.0–36.0)
MCV: 89.6 fL (ref 80.0–100.0)
MPV: 11.6 fL (ref 7.5–12.5)
Monocytes Relative: 8.4 %
Neutro Abs: 4017 cells/uL (ref 1500–7800)
Neutrophils Relative %: 48.4 %
Platelets: 202 10*3/uL (ref 140–400)
RBC: 4.63 10*6/uL (ref 3.80–5.10)
RDW: 13.1 % (ref 11.0–15.0)
Total Lymphocyte: 41.5 %
WBC: 8.3 10*3/uL (ref 3.8–10.8)

## 2022-05-11 NOTE — Progress Notes (Signed)
CBC WNL.  Glucose was 128.  Rest of CMP stable.

## 2022-05-13 ENCOUNTER — Ambulatory Visit (INDEPENDENT_AMBULATORY_CARE_PROVIDER_SITE_OTHER): Payer: Medicare HMO

## 2022-05-13 DIAGNOSIS — I83023 Varicose veins of left lower extremity with ulcer of ankle: Secondary | ICD-10-CM

## 2022-05-13 DIAGNOSIS — L97329 Non-pressure chronic ulcer of left ankle with unspecified severity: Secondary | ICD-10-CM

## 2022-05-13 NOTE — Progress Notes (Signed)
76 year old female presents the office today for follow-up evaluation of ongoing wound of her left foot, venous stasis.  Unna boot was applied followed cast padding, from the toes to the knee as well as an coban.    Patient denies nausea, vomiting, fever, and chills.    Monitor for any clinical signs or symptoms of infection and directed to call the office immediately should any occur or go to the ER.  Patient verbalized understanding.  Patient to return in 5 days for reapplication of unna boot.

## 2022-05-17 ENCOUNTER — Ambulatory Visit (INDEPENDENT_AMBULATORY_CARE_PROVIDER_SITE_OTHER): Payer: Medicare HMO

## 2022-05-17 DIAGNOSIS — L97329 Non-pressure chronic ulcer of left ankle with unspecified severity: Secondary | ICD-10-CM | POA: Diagnosis not present

## 2022-05-17 DIAGNOSIS — I83023 Varicose veins of left lower extremity with ulcer of ankle: Secondary | ICD-10-CM | POA: Diagnosis not present

## 2022-05-17 NOTE — Progress Notes (Signed)
76 year old female presents the office today for follow-up evaluation of ongoing wound of her left foot, venous stasis.  Unna boot was applied followed cast padding, from the toes to the knee as well as an coban.    Patient denies nausea, vomiting, fever, and chills.    Monitor for any clinical signs or symptoms of infection and directed to call the office immediately should any occur or go to the ER.  Patient verbalized understanding.  Patient to return in 3 days for reapplication of unna boot.

## 2022-05-20 ENCOUNTER — Ambulatory Visit (INDEPENDENT_AMBULATORY_CARE_PROVIDER_SITE_OTHER): Payer: Medicare HMO | Admitting: Podiatry

## 2022-05-20 ENCOUNTER — Encounter: Payer: Self-pay | Admitting: Podiatry

## 2022-05-20 DIAGNOSIS — I890 Lymphedema, not elsewhere classified: Secondary | ICD-10-CM | POA: Diagnosis not present

## 2022-05-20 DIAGNOSIS — I83023 Varicose veins of left lower extremity with ulcer of ankle: Secondary | ICD-10-CM | POA: Diagnosis not present

## 2022-05-20 DIAGNOSIS — L97329 Non-pressure chronic ulcer of left ankle with unspecified severity: Secondary | ICD-10-CM | POA: Diagnosis not present

## 2022-05-23 ENCOUNTER — Encounter (INDEPENDENT_AMBULATORY_CARE_PROVIDER_SITE_OTHER): Payer: Self-pay

## 2022-05-23 NOTE — Progress Notes (Signed)
Subjective: Chief Complaint  Patient presents with   Foot Ulcer    Follow up venous ulcer left leg   "Its doing much better"     76 year old female presents the office today for proper evaluation of lymphedema, wound on the left leg.  She states the wound is doing better.  She also did get the lymphedema pump this week as well as she started using this as well.  She denies any fevers or chills.  No other concerns.   Objective: AAO x3, NAD DP/PT pulses palpable bilaterally, CRT less than 3 seconds Small superficial skin breakdown present medial aspect the left calf.  Overall wound.  No better.  No probing, no tunneling.  No significant cellulitis noted today.  Chronic lymphedema, venous changes present.  No erythema warmth or signs of infection. No pain with calf compression, swelling, warmth, erythema       Assessment: Lymphedema, ulcerations left leg  Plan: -All treatment options discussed with the patient including all alternatives, risks, complications.  -Unna boot was applied today.  Precautions were advised and when to remove this. -She has a lymphedema pump as well which she can continue to use. -Follow-up with vascular surgery as scheduled -Monitor for any clinical signs or symptoms of infection and directed to call the office immediately should any occur or go to the ER.  She will follow-up next week for dressing change.  Trula Slade DPM

## 2022-05-24 ENCOUNTER — Ambulatory Visit (INDEPENDENT_AMBULATORY_CARE_PROVIDER_SITE_OTHER): Payer: Medicare HMO | Admitting: *Deleted

## 2022-05-24 DIAGNOSIS — I83023 Varicose veins of left lower extremity with ulcer of ankle: Secondary | ICD-10-CM

## 2022-05-24 DIAGNOSIS — L97329 Non-pressure chronic ulcer of left ankle with unspecified severity: Secondary | ICD-10-CM | POA: Diagnosis not present

## 2022-05-24 NOTE — Progress Notes (Signed)
Patient presents today for follow up and dressing change of the wound to her lower left leg, venous stasis ulcers.  Unna boot was applied followed cast padding, from the toes to the knee and covered with an acewrap.    Patient denies nausea, vomiting, fever, and chills. No signs of infection. She reports the wounds are looking much better.   Reviewed signs and symptoms of infection and directed to call the office immediately and should any occur go to the ER.  Patient verbalized understanding.  Patient to return on Friday (11/3) for another unna boot application.

## 2022-05-27 ENCOUNTER — Other Ambulatory Visit: Payer: Medicare HMO

## 2022-05-27 ENCOUNTER — Ambulatory Visit (INDEPENDENT_AMBULATORY_CARE_PROVIDER_SITE_OTHER): Payer: Medicare HMO | Admitting: Podiatry

## 2022-05-27 DIAGNOSIS — I890 Lymphedema, not elsewhere classified: Secondary | ICD-10-CM

## 2022-05-27 DIAGNOSIS — L03116 Cellulitis of left lower limb: Secondary | ICD-10-CM | POA: Diagnosis not present

## 2022-05-27 MED ORDER — DOXYCYCLINE HYCLATE 100 MG PO TABS: 100.0000 mg | ORAL_TABLET | Freq: Two times a day (BID) | ORAL | 0 refills | Status: DC

## 2022-05-29 NOTE — Progress Notes (Signed)
Subjective: No chief complaint on file.     76 year old female presents the office today for proper evaluation of lymphedema, wound on the left leg.  She did remove the bandage and noticed redness and some drainage coming from the wound.  No frank purulence.  She denies any fevers or chills.  Objective: AAO x3, NAD DP/PT pulses palpable bilaterally, CRT less than 3 seconds Small superficial skin breakdown present medial aspect the left calf.  Wounds are still superficial but there is erythema around the area with any ascending cellulitis.  There is some clear drainage expressed but no frank purulence.  No fluctuance or crepitation and there is no malodor. No pain with calf compression, swelling, warmth, erythema  Assessment: Lymphedema, ulcerations left leg; cellulitis  Plan: -All treatment options discussed with the patient including all alternatives, risks, complications.  -Unna boot was applied today.  Precautions were advised and when to remove this. -She has a lymphedema pump as well which she can continue to use. -Doxycycline -Follow-up with vascular surgery as scheduled -Monitor for any clinical signs or symptoms of infection and directed to call the office immediately should any occur or go to the ER.  Trula Slade DPM

## 2022-05-31 ENCOUNTER — Ambulatory Visit (INDEPENDENT_AMBULATORY_CARE_PROVIDER_SITE_OTHER): Payer: Medicare HMO

## 2022-05-31 DIAGNOSIS — L97329 Non-pressure chronic ulcer of left ankle with unspecified severity: Secondary | ICD-10-CM

## 2022-05-31 DIAGNOSIS — I83023 Varicose veins of left lower extremity with ulcer of ankle: Secondary | ICD-10-CM

## 2022-05-31 DIAGNOSIS — L03116 Cellulitis of left lower limb: Secondary | ICD-10-CM

## 2022-05-31 NOTE — Progress Notes (Signed)
Patient presents today for follow up and dressing change of the wound to her lower left leg, venous stasis ulcers.  Unna boot was applied followed cast padding, from the toes to the knee and covered with a 3" coban.    Patient denies nausea, vomiting, fever, and chills. No signs of infection. She reports the wounds are looking much better. Patient reports taking antibiotic as directed.    Reviewed signs and symptoms of infection and directed to call the office immediately and should any occur go to the ER.  Patient verbalized understanding.  Patient to return on Thursday (11/9) for another unna boot application.

## 2022-06-02 ENCOUNTER — Ambulatory Visit (INDEPENDENT_AMBULATORY_CARE_PROVIDER_SITE_OTHER): Payer: Medicare HMO

## 2022-06-02 DIAGNOSIS — L03116 Cellulitis of left lower limb: Secondary | ICD-10-CM | POA: Diagnosis not present

## 2022-06-02 NOTE — Progress Notes (Signed)
Patient presents today for follow up and dressing change of the wound to her lower left leg, venous stasis ulcers.  Unna boot was applied followed cast padding, from the toes to the knee and covered with a 3" coban.    Patient denies nausea, vomiting, fever, and chills. No signs of infection. She reports the wounds are looking much better. Patient reports taking antibiotic as directed.    Reviewed signs and symptoms of infection and directed to call the office immediately and should any occur go to the ER.  Patient verbalized understanding.  Patient to return next week  for another unna boot application.

## 2022-06-07 ENCOUNTER — Ambulatory Visit: Payer: Medicare HMO

## 2022-06-10 ENCOUNTER — Ambulatory Visit (INDEPENDENT_AMBULATORY_CARE_PROVIDER_SITE_OTHER): Payer: Medicare HMO | Admitting: *Deleted

## 2022-06-10 DIAGNOSIS — I83023 Varicose veins of left lower extremity with ulcer of ankle: Secondary | ICD-10-CM | POA: Diagnosis not present

## 2022-06-10 DIAGNOSIS — L97329 Non-pressure chronic ulcer of left ankle with unspecified severity: Secondary | ICD-10-CM | POA: Diagnosis not present

## 2022-06-10 NOTE — Progress Notes (Signed)
Patient presents today for follow up and dressing change of the wound to her lower left leg, venous stasis ulcers.  Unna boot was applied followed cast padding, from the toes to the knee and covered with an acewrap.    Patient denies nausea, vomiting, fever, and chills. No signs of infection. She reports the wounds are looking much better.   Reviewed signs and symptoms of infection and directed to call the office immediately and should any occur go to the ER.  Patient verbalized understanding.  Patient to return Tuesday (11/21) for re-evaluation by Dr. Jacqualyn Posey and another Louretta Parma boot application.

## 2022-06-13 ENCOUNTER — Other Ambulatory Visit: Payer: Self-pay | Admitting: Gastroenterology

## 2022-06-14 ENCOUNTER — Other Ambulatory Visit: Payer: Medicare HMO

## 2022-06-14 ENCOUNTER — Ambulatory Visit: Payer: Medicare HMO | Admitting: Podiatry

## 2022-06-15 DIAGNOSIS — M5030 Other cervical disc degeneration, unspecified cervical region: Secondary | ICD-10-CM | POA: Diagnosis not present

## 2022-06-15 DIAGNOSIS — Z79899 Other long term (current) drug therapy: Secondary | ICD-10-CM | POA: Diagnosis not present

## 2022-06-15 DIAGNOSIS — M47816 Spondylosis without myelopathy or radiculopathy, lumbar region: Secondary | ICD-10-CM | POA: Diagnosis not present

## 2022-06-15 DIAGNOSIS — M47812 Spondylosis without myelopathy or radiculopathy, cervical region: Secondary | ICD-10-CM | POA: Diagnosis not present

## 2022-06-15 DIAGNOSIS — M069 Rheumatoid arthritis, unspecified: Secondary | ICD-10-CM | POA: Diagnosis not present

## 2022-06-20 ENCOUNTER — Ambulatory Visit (INDEPENDENT_AMBULATORY_CARE_PROVIDER_SITE_OTHER): Payer: Medicare HMO

## 2022-06-20 DIAGNOSIS — I83023 Varicose veins of left lower extremity with ulcer of ankle: Secondary | ICD-10-CM

## 2022-06-20 DIAGNOSIS — L97329 Non-pressure chronic ulcer of left ankle with unspecified severity: Secondary | ICD-10-CM

## 2022-06-20 NOTE — Progress Notes (Signed)
Patient presents today for follow up and dressing change of the wound to her lower left leg, venous stasis ulcers.  Unna boot was applied followed cast padding, from the toes to the knee and covered with an acewrap.    Patient denies nausea, vomiting, fever, and chills. No signs of infection. She reports the wounds are looking much better.   Reviewed signs and symptoms of infection and directed to call the office immediately and should any occur go to the ER.  Patient verbalized understanding.  Patient to return Friday (99/2)[id number] unna boot application.

## 2022-06-24 ENCOUNTER — Ambulatory Visit (INDEPENDENT_AMBULATORY_CARE_PROVIDER_SITE_OTHER): Payer: Medicare HMO

## 2022-06-24 DIAGNOSIS — L97329 Non-pressure chronic ulcer of left ankle with unspecified severity: Secondary | ICD-10-CM

## 2022-06-24 DIAGNOSIS — I83023 Varicose veins of left lower extremity with ulcer of ankle: Secondary | ICD-10-CM

## 2022-06-24 NOTE — Progress Notes (Signed)
  Patient presents today for follow up and dressing change of the wound to her lower left leg, venous stasis ulcers.  Unna boot was applied followed cast padding, from the toes to the knee and covered with an acewrap.    Patient denies nausea, vomiting, fever, and chills. No signs of infection. She reports the wounds are draining a bit and would like to see Dr. Sharol Given to see if he has any suggestions for further wound care treatment.   Per Dr. Jacqualyn Posey, we will hold off on oral antibiotics at this time.    Reviewed signs and symptoms of infection and directed to call the office immediately and should any occur go to the ER.  Patient verbalized understanding.  Patient to return Tuesday (88/7)[id number] unna boot application.

## 2022-06-28 ENCOUNTER — Ambulatory Visit (INDEPENDENT_AMBULATORY_CARE_PROVIDER_SITE_OTHER): Payer: Medicare HMO

## 2022-06-28 VITALS — BP 140/82 | HR 76

## 2022-06-28 DIAGNOSIS — L97329 Non-pressure chronic ulcer of left ankle with unspecified severity: Secondary | ICD-10-CM

## 2022-06-28 DIAGNOSIS — I83023 Varicose veins of left lower extremity with ulcer of ankle: Secondary | ICD-10-CM

## 2022-06-28 NOTE — Progress Notes (Signed)
  Patient presents today for follow up and dressing change of the wound to her lower left leg, venous stasis ulcers.  Unna boot was applied followed cast padding, from the toes to the knee and covered with an acewrap.    Patient denies nausea, vomiting, fever, and chills. No signs of infection. She reports the wounds are draining a bit and is scheduled to see Dr. Sharol Given on 07/05/22 to see if he has any suggestions for further wound care treatment.   Per Dr. Jacqualyn Posey, we will hold off on oral antibiotics at this time.    Reviewed signs and symptoms of infection and directed to call the office immediately and should any occur go to the ER.  Patient verbalized understanding.  Patient to return Friday (96/0)[id number] unna boot application.

## 2022-07-01 ENCOUNTER — Ambulatory Visit (INDEPENDENT_AMBULATORY_CARE_PROVIDER_SITE_OTHER): Payer: Medicare HMO | Admitting: *Deleted

## 2022-07-01 DIAGNOSIS — I83023 Varicose veins of left lower extremity with ulcer of ankle: Secondary | ICD-10-CM

## 2022-07-01 DIAGNOSIS — M79672 Pain in left foot: Secondary | ICD-10-CM

## 2022-07-01 NOTE — Progress Notes (Signed)
Patient presents today for follow up and dressing change of the wound to her lower left leg, venous stasis ulcers.  Unna boot was applied followed cast padding, from the toes to the knee and covered with an acewrap.   Patient denies nausea, vomiting, fever, and chills. No signs of infection. She reports the wounds are looking much better.   Reviewed signs and symptoms of infection and directed to call the office immediately and should any occur go to the ER.  Patient verbalized understanding.  Patient to return for re-evaluation by Dr. Jacqualyn Posey next Friday, after her appointment with Dr. Sharol Given on Tuesday.

## 2022-07-05 ENCOUNTER — Encounter: Payer: Self-pay | Admitting: Orthopedic Surgery

## 2022-07-05 ENCOUNTER — Ambulatory Visit (INDEPENDENT_AMBULATORY_CARE_PROVIDER_SITE_OTHER): Payer: Medicare HMO

## 2022-07-05 ENCOUNTER — Ambulatory Visit: Payer: Medicare HMO | Admitting: Orthopedic Surgery

## 2022-07-05 DIAGNOSIS — M79605 Pain in left leg: Secondary | ICD-10-CM | POA: Diagnosis not present

## 2022-07-05 DIAGNOSIS — L97921 Non-pressure chronic ulcer of unspecified part of left lower leg limited to breakdown of skin: Secondary | ICD-10-CM | POA: Diagnosis not present

## 2022-07-05 NOTE — Progress Notes (Signed)
Office Visit Note   Patient: [name]           Date of Birth: 1945/09/26           MRN: [id number] Visit Date: 07/05/2022              Requested by: Trula Slade, DPM [address] [place],  [place] [address] PCP: Crist Infante, MD  Chief Complaint  Patient presents with   Left Leg - Wound Check      HPI: Patient is a 76 year old woman who states that she has had ulceration in her left leg off and on for about 5 years.  Patient states she is currently been undergoing compression wraps with podiatry since May.  She has been treated with antibiotics Unna compression wraps twice a week.  Patient states she has lymphedema pumps that she has tried 1 hour a day she states she had to stop this due to the increased pain from the lymphedema pumps.  Patient states she feels like her bone hurts.  Patient has diet-controlled diabetes hemoglobin A1c 7.0.  Patient has seen vascular surgery and has undergone a venous ultrasound.  Patient also has rheumatoid arthritis and is on Plaquenil 200 mg twice a day.  Patient has had wound cultures that were negative for infection.  Assessment & Plan: Visit Diagnoses:  1. Pain in left leg   2. Calciphylaxis of left lower extremity with nonhealing ulcer, limited to breakdown of skin [facility])     Plan: Patient has massive calciphylaxis.  This will be a very difficult wound to heal.  I have applied some donated Kerecis powder in the office will try to get authorization for application of Kerecis tissue graft in the office.  With surgery patient may have a much harder time to heal this then with applying the Kerecis micro tissue graft in the office.  Follow-Up Instructions: Return in about 1 week (around 07/12/2022).   Ortho Exam  Patient is alert, oriented, no adenopathy, well-dressed, normal affect, normal respiratory effort. Examination patient has a palpable dorsalis pedis and posterior tibial pulse no arterial insufficiencies.   Her calf is extremely hard with thin atrophic skin consistent with calciphylaxis.  She has an ulcer in the posterior medial aspect of the left calf that probes down to hard calcified subcutaneous tissue consistent with calciphylaxis.  After informed consent donated Kerecis micro powder was applied to the wound and this was covered with a Mepilex light dressing and an Ace wrap.  She will leave the Mepilex in place for a week and change the Ace wrap daily.  Imaging: XR Tibia/Fibula Left  Result Date: 07/05/2022 2 view radiographs of the left leg shows extensive calciphylaxis of the soft tissues circumferentially to the mid calf region.    Labs: Lab Results  Component Value Date   HGBA1C 8.1 (H) 02/26/2019   HGBA1C 6.2 (H) 09/03/2015   HGBA1C 6.5 (H) 04/15/2011   CRP <0.8 03/03/2019   CRP <0.8 03/02/2019   CRP <0.8 03/01/2019   LABURIC 7.3 11/13/2020   REPTSTATUS 07/29/2010 FINAL 07/28/2010   CULT NO GROWTH 07/28/2010     Lab Results  Component Value Date   ALBUMIN 4.6 10/02/2020   ALBUMIN 3.4 (L) 03/07/2019   ALBUMIN 3.2 (L) 03/06/2019    Lab Results  Component Value Date   MG 2.3 03/03/2019   MG 2.1 03/02/2019   MG 2.0 03/01/2019   Lab Results  Component Value Date   VD25OH 50.3  01/04/2018   VD25OH 41.8 12/24/2015   VD25OH 38 12/25/2013    No results found for: "PREALBUMIN"    Latest Ref Rng & Units 05/10/2022   11:08 AM 12/07/2021   11:59 AM 07/08/2021   11:10 AM  CBC EXTENDED  WBC 3.8 - 10.8 Thousand/uL 8.3  7.5  6.8   RBC 3.80 - 5.10 Million/uL 4.63  4.40  4.40   Hemoglobin 11.7 - 15.5 g/dL 14.0  13.1  13.3   HCT 35.0 - 45.0 % 41.5  40.1  40.5   Platelets 140 - 400 Thousand/uL 202  208  178   NEUT# 1,500 - 7,800 cells/uL 4,017  3,630  4,019   Lymph# 850 - 3,900 cells/uL 3,445  3,060  2,094      There is no height or weight on file to calculate BMI.  Orders:  Orders Placed This Encounter  Procedures   XR Tibia/Fibula Left   No orders of the  defined types were placed in this encounter.    Procedures: No procedures performed  Clinical Data: No additional findings.  ROS:  All other systems negative, except as noted in the HPI. Review of Systems  Objective: Vital Signs: There were no vitals taken for this visit.  Specialty Comments:  No specialty comments available.  PMFS History: Patient Active Problem List   Diagnosis Date Noted   Lower limb ulcer, calf, left, limited to breakdown of skin ([place]) 04/01/2022   Lymphedema 04/01/2022   Trigger finger, left middle finger 10/26/2021   Trigger finger, right ring finger 10/26/2021   Trigger finger, right middle finger 10/26/2021   History of malignant neoplasm of breast 08/23/2021   OAB (overactive bladder) 07/29/2021   Benign neoplasm of ascending colon    Hx of adenomatous colonic polyps    Occult blood in stools    BMI 40.0-44.9, adult ([place]) 01/20/2021   Cervical prolapse 01/20/2021   Incontinence of feces with fecal urgency 01/20/2021   Irritable bowel syndrome with diarrhea 01/20/2021   Mixed incontinence urge and stress 01/20/2021   Unstable angina ([place]) 10/06/2020   Acute on chronic respiratory failure with hypoxia ([place]) 02/28/2019   OSA on CPAP 02/28/2019   Diabetes mellitus type 2, uncontrolled, with complications 92/05/9416   Breast cancer ([place]) 02/27/2019   History of cancer chemotherapy 02/27/2019   History of radiation therapy 02/27/2019   Diabetic neuropathy ([place]) 02/27/2019   HLD (hyperlipidemia) 02/27/2019   Essential hypertension 02/27/2019   QT prolongation 02/27/2019   Pneumonia due to COVID-19 virus 02/24/2019   Diarrhea    Benign neoplasm of cecum    Abdominal pain, epigastric    Gastroesophageal reflux disease    Dependence on nocturnal oxygen therapy 07/10/2017   Open wound of left lower leg 12/26/2016   Vitamin D deficiency 09/14/2016   History of total knee replacement, bilateral 09/14/2016   DJD (degenerative joint disease),  cervical 09/14/2016   Spondylosis of lumbar region without myelopathy or radiculopathy 09/14/2016   High risk medication use 05/20/2016   Spinal stenosis, lumbar region, with neurogenic claudication 09/08/2015    Class: Chronic   OSA (obstructive sleep apnea) 10/17/2013   Dyspnea 09/04/2013   Atypical chest pain 09/04/2013   Morbid obesity ([place]) 03/21/2012   Cancer of lower-outer quadrant of female breast ([place]) 03/15/2012   Diabetes mellitus, type II ([place]) 02/22/2011   Coronary artery disease    Hypertension    Hyperlipidemia    OA (osteoarthritis) of knee    Past Medical History:  Diagnosis Date  Actinic keratosis    Anxiety    Asthma    Albuterol inhaler as needed.Pulmicort neb as needed   Breast cancer ([place])    right - lumpectomy    Chronic back pain    spinal stenosis   Coronary artery disease    COVID    uses o2 since at hs   Dependence on nocturnal oxygen therapy 07/10/2017   Pt uses 2 liters 02 at night    Depression    takes CYmbalta and Wellbutrin daily   Diabetes mellitus    not on any meds/controlled by diet   Dyspnea    with exertion occasionally when lies down   GERD (gastroesophageal reflux disease)    takes Omeprazole daily   Headache    oocasionally   History of kidney stones    Hyperlipidemia    not on any meds   Hypertension    takes Benazepril,Bystolic,and Amlodipine daily   IBS (irritable bowel syndrome)    Joint pain    Joint swelling    Livedoid vasculitis    Nocturia    OA (osteoarthritis) of knee    Other seborrheic keratosis    Oxygen deficiency    2 liters at night per pt for OSA- no cpap    Peripheral edema    takes daily as needed   Peripheral neuropathy    Personal history of chemotherapy    Personal history of radiation therapy    Pneumonia    hx of-several yrs ago   RA (rheumatoid arthritis) ([place])    Urinary frequency    Urinary urgency    Weakness    numbness and tingling mainly in left leg occasionally in  right.Tingling/numbness in hands    Family History  Problem Relation Age of Onset   Heart disease Mother    Hyperlipidemia Mother    Lung cancer Father    Diabetes Father    Hyperlipidemia Father    Heart disease Sister    Hypertension Sister    Hyperlipidemia Sister    Heart disease Brother    Diabetes Brother    Hypertension Brother    Hyperlipidemia Brother    Lung cancer Brother    Heart disease Brother    Hypertension Brother    Hyperlipidemia Brother    Heart disease Brother    Hypertension Brother    Hyperlipidemia Brother    Heart disease Maternal Aunt    Asthma Maternal Uncle    Heart disease Maternal Uncle     Past Surgical History:  Procedure Laterality Date   BIOPSY  06/24/2021   Procedure: BIOPSY;  Surgeon: Ladene Artist, MD;  [facility]: [facility];  Service: Endoscopy;;   BREAST LUMPECTOMY  1997   RIGHT BREAST   CARDIAC CATHETERIZATION  04/23/2004   EF 60%   cataract surgery Bilateral    COLONOSCOPY     COLONOSCOPY WITH PROPOFOL N/A 08/07/2017   Procedure: COLONOSCOPY WITH PROPOFOL;  Surgeon: Ladene Artist, MD;  [facility];  Service: Endoscopy;  Laterality: N/A;   COLONOSCOPY WITH PROPOFOL N/A 06/24/2021   Procedure: COLONOSCOPY WITH PROPOFOL;  Surgeon: Ladene Artist, MD;  [facility];  Service: Endoscopy;  Laterality: N/A;   CORONARY ARTERY BYPASS GRAFT  2005   LIMA GRAFT TO LAD, SAPHENOUS VEIN GRAFT TO THE FIRST DIAGONAL, AND LEFT RADIAL ARTERY GRAFT TO THE OM   ESOPHAGOGASTRODUODENOSCOPY (EGD) WITH PROPOFOL N/A 08/07/2017   Procedure: ESOPHAGOGASTRODUODENOSCOPY (EGD) WITH PROPOFOL;  Surgeon: Ladene Artist, MD;  Location: [facility];  Service: Endoscopy;  Laterality: N/A;   FOOT SURGERY Right    JOINT REPLACEMENT     LITHOTRIPSY     LUMBAR LAMINECTOMY/DECOMPRESSION MICRODISCECTOMY N/A 09/08/2015   Procedure: CENTRAL DECOMPRESSIVE LUMBAR LAMINECTOMIES L3-4, L4-5 AND BILATERAL HEMILAMINECTOMY L5-S1;  Surgeon: Jessy Oto, MD;  Location: [facility];  Service: Orthopedics;  Laterality: N/A;   nodule removed from left elbow     POLYPECTOMY  06/24/2021   Procedure: POLYPECTOMY;  Surgeon: Ladene Artist, MD;  [facility];  Service: Endoscopy;;   RIGHT/LEFT HEART CATH AND CORONARY/GRAFT ANGIOGRAPHY N/A 10/06/2020   Procedure: RIGHT/LEFT HEART CATH AND CORONARY/GRAFT ANGIOGRAPHY;  Surgeon: Martinique, Peter M, MD;  Location: [facility];  Service: Cardiovascular;  Laterality: N/A;   SHOULDER ARTHROSCOPY     TOTAL KNEE ARTHROPLASTY Bilateral    TOTAL SHOULDER ARTHROPLASTY     TRIPLE BYPASS  04/27/05   US ECHOCARDIOGRAPHY  01/06/2009   EF 55-60%   WRIST SURGERY Left    Social History   Occupational History   Occupation: retired    Fish farm manager: UNEMPLOYED  Tobacco Use   Smoking status: Never    Passive exposure: Never   Smokeless tobacco: Never  Vaping Use   Vaping Use: Never used  Substance and Sexual Activity   Alcohol use: No    Alcohol/week: 0.0 standard drinks of alcohol   Drug use: Never   Sexual activity: Not on file

## 2022-07-07 ENCOUNTER — Encounter (INDEPENDENT_AMBULATORY_CARE_PROVIDER_SITE_OTHER): Payer: Self-pay | Admitting: Vascular Surgery

## 2022-07-08 ENCOUNTER — Ambulatory Visit: Payer: Medicare HMO | Admitting: Podiatry

## 2022-07-11 ENCOUNTER — Ambulatory Visit: Payer: Medicare HMO | Admitting: Orthopedic Surgery

## 2022-07-11 ENCOUNTER — Encounter: Payer: Self-pay | Admitting: Orthopedic Surgery

## 2022-07-11 DIAGNOSIS — L97921 Non-pressure chronic ulcer of unspecified part of left lower leg limited to breakdown of skin: Secondary | ICD-10-CM

## 2022-07-11 DIAGNOSIS — E785 Hyperlipidemia, unspecified: Secondary | ICD-10-CM | POA: Diagnosis not present

## 2022-07-11 DIAGNOSIS — E669 Obesity, unspecified: Secondary | ICD-10-CM | POA: Diagnosis not present

## 2022-07-11 NOTE — Progress Notes (Signed)
Office Visit Note   Patient: [name]           Date of Birth: July 22, 1946           MRN: [id number] Visit Date: 07/11/2022              Requested by: Crist Infante, [place],  [place] [address] PCP: Crist Infante, MD  Chief Complaint  Patient presents with   Left Leg - Follow-up    S/p small donation graft in office last week       HPI: Patient is a 76 year old woman who presents in follow-up status post donated tissue graft for calciphylaxis ulcers left calf  Assessment & Plan: Visit Diagnoses:  1. Calciphylaxis of left lower extremity with nonhealing ulcer, limited to breakdown of skin ([place])     Plan: Patient has shown significant improvement in the ulcers from the [facility] tissue graft.  Will have her resume wearing the Vive wear compression stockings wear these daily recommended exercise elevation and a plant-based diet.  Recommended coconut water for her cramps in her thighs and calf  Follow-Up Instructions: Return in about 2 weeks (around 07/25/2022).   Ortho Exam  Patient is alert, oriented, no adenopathy, well-dressed, normal affect, normal respiratory effort. Examination patient has had excellent improvement in the epithelization of the ulcers left calf secondary to venous insufficiency and calciphylaxis.  The calf measures 42 cm in circumference.  She has a palpable dorsalis pedis pulse with varicose veins with discoloration in the foot and ankle secondary to the venous insufficiency.  Patient is also on fluid pills and reports cramps that are now in her thigh on the left leg previously in the calf.  Imaging: No results found.    Labs: Lab Results  Component Value Date   HGBA1C 8.1 (H) 02/26/2019   HGBA1C 6.2 (H) 09/03/2015   HGBA1C 6.5 (H) 04/15/2011   CRP <0.8 03/03/2019   CRP <0.8 03/02/2019   CRP <0.8 03/01/2019   LABURIC 7.3 11/13/2020   REPTSTATUS 07/29/2010 FINAL 07/28/2010   CULT NO GROWTH 07/28/2010     Lab Results   Component Value Date   ALBUMIN 4.6 10/02/2020   ALBUMIN 3.4 (L) 03/07/2019   ALBUMIN 3.2 (L) 03/06/2019    Lab Results  Component Value Date   MG 2.3 03/03/2019   MG 2.1 03/02/2019   MG 2.0 03/01/2019   Lab Results  Component Value Date   VD25OH 50.3 01/04/2018   VD25OH 41.8 12/24/2015   VD25OH 38 12/25/2013    No results found for: "PREALBUMIN"    Latest Ref Rng & Units 05/10/2022   11:08 AM 12/07/2021   11:59 AM 07/08/2021   11:10 AM  CBC EXTENDED  WBC 3.8 - 10.8 Thousand/uL 8.3  7.5  6.8   RBC 3.80 - 5.10 Million/uL 4.63  4.40  4.40   Hemoglobin 11.7 - 15.5 g/dL 14.0  13.1  13.3   HCT 35.0 - 45.0 % 41.5  40.1  40.5   Platelets 140 - 400 Thousand/uL 202  208  178   NEUT# 1,500 - 7,800 cells/uL 4,017  3,630  4,019   Lymph# 850 - 3,900 cells/uL 3,445  3,060  2,094      There is no height or weight on file to calculate BMI.  Orders:  No orders of the defined types were placed in this encounter.  No orders of the defined types were placed in this encounter.    Procedures: No procedures performed  Clinical Data: No additional findings.  ROS:  All other systems negative, except as noted in the HPI. Review of Systems  Objective: Vital Signs: There were no vitals taken for this visit.  Specialty Comments:  No specialty comments available.  PMFS History: Patient Active Problem List   Diagnosis Date Noted   Lower limb ulcer, calf, left, limited to breakdown of skin ([place]) 04/01/2022   Lymphedema 04/01/2022   Trigger finger, left middle finger 10/26/2021   Trigger finger, right ring finger 10/26/2021   Trigger finger, right middle finger 10/26/2021   History of malignant neoplasm of breast 08/23/2021   OAB (overactive bladder) 07/29/2021   Benign neoplasm of ascending colon    Hx of adenomatous colonic polyps    Occult blood in stools    BMI 40.0-44.9, adult ([place]) 01/20/2021   Cervical prolapse 01/20/2021   Incontinence of feces with fecal urgency  01/20/2021   Irritable bowel syndrome with diarrhea 01/20/2021   Mixed incontinence urge and stress 01/20/2021   Unstable angina ([place]) 10/06/2020   Acute on chronic respiratory failure with hypoxia ([place]) 02/28/2019   OSA on CPAP 02/28/2019   Diabetes mellitus type 2, uncontrolled, with complications 53/29/9242   Breast cancer ([place]) 02/27/2019   History of cancer chemotherapy 02/27/2019   History of radiation therapy 02/27/2019   Diabetic neuropathy ([place]) 02/27/2019   HLD (hyperlipidemia) 02/27/2019   Essential hypertension 02/27/2019   QT prolongation 02/27/2019   Pneumonia due to COVID-19 virus 02/24/2019   Diarrhea    Benign neoplasm of cecum    Abdominal pain, epigastric    Gastroesophageal reflux disease    Dependence on nocturnal oxygen therapy 07/10/2017   Open wound of left lower leg 12/26/2016   Vitamin D deficiency 09/14/2016   History of total knee replacement, bilateral 09/14/2016   DJD (degenerative joint disease), cervical 09/14/2016   Spondylosis of lumbar region without myelopathy or radiculopathy 09/14/2016   High risk medication use 05/20/2016   Spinal stenosis, lumbar region, with neurogenic claudication 09/08/2015    Class: Chronic   OSA (obstructive sleep apnea) 10/17/2013   Dyspnea 09/04/2013   Atypical chest pain 09/04/2013   Morbid obesity ([place]) 03/21/2012   Cancer of lower-outer quadrant of female breast ([place]) 03/15/2012   Diabetes mellitus, type II ([place]) 02/22/2011   Coronary artery disease    Hypertension    Hyperlipidemia    OA (osteoarthritis) of knee    Past Medical History:  Diagnosis Date   Actinic keratosis    Anxiety    Asthma    Albuterol inhaler as needed.Pulmicort neb as needed   Breast cancer ([place])    right - lumpectomy    Chronic back pain    spinal stenosis   Coronary artery disease    COVID    uses o2 since at hs   Dependence on nocturnal oxygen therapy 07/10/2017   Pt uses 2 liters 02 at night    Depression    takes  CYmbalta and Wellbutrin daily   Diabetes mellitus    not on any meds/controlled by diet   Dyspnea    with exertion occasionally when lies down   GERD (gastroesophageal reflux disease)    takes Omeprazole daily   Headache    oocasionally   History of kidney stones    Hyperlipidemia    not on any meds   Hypertension    takes Benazepril,Bystolic,and Amlodipine daily   IBS (irritable bowel syndrome)    Joint pain    Joint swelling  Livedoid vasculitis    Nocturia    OA (osteoarthritis) of knee    Other seborrheic keratosis    Oxygen deficiency    2 liters at night per pt for OSA- no cpap    Peripheral edema    takes daily as needed   Peripheral neuropathy    Personal history of chemotherapy    Personal history of radiation therapy    Pneumonia    hx of-several yrs ago   RA (rheumatoid arthritis) ([facility])    Urinary frequency    Urinary urgency    Weakness    numbness and tingling mainly in left leg occasionally in right.Tingling/numbness in hands    Family History  Problem Relation Age of Onset   Heart disease Mother    Hyperlipidemia Mother    Lung cancer Father    Diabetes Father    Hyperlipidemia Father    Heart disease Sister    Hypertension Sister    Hyperlipidemia Sister    Heart disease Brother    Diabetes Brother    Hypertension Brother    Hyperlipidemia Brother    Lung cancer Brother    Heart disease Brother    Hypertension Brother    Hyperlipidemia Brother    Heart disease Brother    Hypertension Brother    Hyperlipidemia Brother    Heart disease Maternal Aunt    Asthma Maternal Uncle    Heart disease Maternal Uncle     Past Surgical History:  Procedure Laterality Date   BIOPSY  06/24/2021   Procedure: BIOPSY;  Surgeon: Ladene Artist, MD;  [facility];  Service: Endoscopy;;   BREAST LUMPECTOMY  1997   RIGHT BREAST   CARDIAC CATHETERIZATION  04/23/2004   EF 60%   cataract surgery Bilateral    COLONOSCOPY     COLONOSCOPY WITH  PROPOFOL N/A 08/07/2017   Procedure: COLONOSCOPY WITH PROPOFOL;  Surgeon: Ladene Artist, MD;  [facility];  Service: Endoscopy;  Laterality: N/A;   COLONOSCOPY WITH PROPOFOL N/A 06/24/2021   Procedure: COLONOSCOPY WITH PROPOFOL;  Surgeon: Ladene Artist, MD;  [facility];  Service: Endoscopy;  Laterality: N/A;   CORONARY ARTERY BYPASS GRAFT  2005   LIMA GRAFT TO LAD, SAPHENOUS VEIN GRAFT TO THE FIRST DIAGONAL, AND LEFT RADIAL ARTERY GRAFT TO THE OM   ESOPHAGOGASTRODUODENOSCOPY (EGD) WITH PROPOFOL N/A 08/07/2017   Procedure: ESOPHAGOGASTRODUODENOSCOPY (EGD) WITH PROPOFOL;  Surgeon: Ladene Artist, MD;  [facility];  Service: Endoscopy;  Laterality: N/A;   FOOT SURGERY Right    JOINT REPLACEMENT     LITHOTRIPSY     LUMBAR LAMINECTOMY/DECOMPRESSION MICRODISCECTOMY N/A 09/08/2015   Procedure: CENTRAL DECOMPRESSIVE LUMBAR LAMINECTOMIES L3-4, L4-5 AND BILATERAL HEMILAMINECTOMY L5-S1;  Surgeon: Jessy Oto, MD;  Location: [facility];  Service: Orthopedics;  Laterality: N/A;   nodule removed from left elbow     POLYPECTOMY  06/24/2021   Procedure: POLYPECTOMY;  Surgeon: Ladene Artist, MD;  [facility];  Service: Endoscopy;;   RIGHT/LEFT HEART CATH AND CORONARY/GRAFT ANGIOGRAPHY N/A 10/06/2020   Procedure: RIGHT/LEFT HEART CATH AND CORONARY/GRAFT ANGIOGRAPHY;  Surgeon: Martinique, Peter M, MD;  Location: [facility];  Service: Cardiovascular;  Laterality: N/A;   SHOULDER ARTHROSCOPY     TOTAL KNEE ARTHROPLASTY Bilateral    TOTAL SHOULDER ARTHROPLASTY     TRIPLE BYPASS  04/27/05   US ECHOCARDIOGRAPHY  01/06/2009   EF 55-60%   WRIST SURGERY Left    Social History   Occupational History  Occupation: retired    Fish farm manager: UNEMPLOYED  Tobacco Use   Smoking status: Never    Passive exposure: Never   Smokeless tobacco: Never  Vaping Use   Vaping Use: Never used  Substance and Sexual Activity   Alcohol use: No    Alcohol/week: 0.0 standard drinks of  alcohol   Drug use: Never   Sexual activity: Not on file

## 2022-07-14 DIAGNOSIS — Z79899 Other long term (current) drug therapy: Secondary | ICD-10-CM | POA: Diagnosis not present

## 2022-07-14 DIAGNOSIS — M47812 Spondylosis without myelopathy or radiculopathy, cervical region: Secondary | ICD-10-CM | POA: Diagnosis not present

## 2022-07-14 DIAGNOSIS — M5030 Other cervical disc degeneration, unspecified cervical region: Secondary | ICD-10-CM | POA: Diagnosis not present

## 2022-07-14 DIAGNOSIS — M069 Rheumatoid arthritis, unspecified: Secondary | ICD-10-CM | POA: Diagnosis not present

## 2022-07-14 DIAGNOSIS — M47816 Spondylosis without myelopathy or radiculopathy, lumbar region: Secondary | ICD-10-CM | POA: Diagnosis not present

## 2022-07-29 ENCOUNTER — Ambulatory Visit (INDEPENDENT_AMBULATORY_CARE_PROVIDER_SITE_OTHER): Payer: Medicare HMO | Admitting: Vascular Surgery

## 2022-08-01 ENCOUNTER — Ambulatory Visit: Payer: Medicare HMO | Admitting: Orthopedic Surgery

## 2022-08-01 DIAGNOSIS — L97921 Non-pressure chronic ulcer of unspecified part of left lower leg limited to breakdown of skin: Secondary | ICD-10-CM

## 2022-08-05 ENCOUNTER — Encounter: Payer: Self-pay | Admitting: Orthopedic Surgery

## 2022-08-05 NOTE — Progress Notes (Signed)
Office Visit Note   Patient: [name]           Date of Birth: 01-Aug-1945           MRN: [id number] Visit Date: 08/01/2022              Requested by: Crist Infante, MD [address] [place],  [place] [address] PCP: Crist Infante, MD  Chief Complaint  Patient presents with   Left Leg - Follow-up      HPI: Patient is a 77 year old woman who presents in follow-up status post application of donated Kerecis tissue graft in the office for left lower extremity calciphylaxis.  Patient is currently wearing the Vive wear sock.  Patient states she cannot tolerate the lymphedema pumps.  Assessment & Plan: Visit Diagnoses:  1. Calciphylaxis of left lower extremity with nonhealing ulcer, limited to breakdown of skin [facility])     Plan: Patient will continue with compression.  Follow-Up Instructions: Return in about 2 months (around 09/30/2022).   Ortho Exam  Patient is alert, oriented, no adenopathy, well-dressed, normal affect, normal respiratory effort. Examination the ulcers are almost completely healed with use of the Kerecis tissue graft and Vive wear compression.  Imaging: No results found. No images are attached to the encounter.  Labs: Lab Results  Component Value Date   HGBA1C 8.1 (H) 02/26/2019   HGBA1C 6.2 (H) 09/03/2015   HGBA1C 6.5 (H) 04/15/2011   CRP <0.8 03/03/2019   CRP <0.8 03/02/2019   CRP <0.8 03/01/2019   LABURIC 7.3 11/13/2020   REPTSTATUS 07/29/2010 FINAL 07/28/2010   CULT NO GROWTH 07/28/2010     Lab Results  Component Value Date   ALBUMIN 4.6 10/02/2020   ALBUMIN 3.4 (L) 03/07/2019   ALBUMIN 3.2 (L) 03/06/2019    Lab Results  Component Value Date   MG 2.3 03/03/2019   MG 2.1 03/02/2019   MG 2.0 03/01/2019   Lab Results  Component Value Date   VD25OH 50.3 01/04/2018   VD25OH 41.8 12/24/2015   VD25OH 38 12/25/2013    No results found for: "PREALBUMIN"    Latest Ref Rng & Units 05/10/2022   11:08 AM 12/07/2021   11:59 AM 07/08/2021    11:10 AM  CBC EXTENDED  WBC 3.8 - 10.8 Thousand/uL 8.3  7.5  6.8   RBC 3.80 - 5.10 Million/uL 4.63  4.40  4.40   Hemoglobin 11.7 - 15.5 g/dL 14.0  13.1  13.3   HCT 35.0 - 45.0 % 41.5  40.1  40.5   Platelets 140 - 400 Thousand/uL 202  208  178   NEUT# 1,500 - 7,800 cells/uL 4,017  3,630  4,019   Lymph# 850 - 3,900 cells/uL 3,445  3,060  2,094      There is no height or weight on file to calculate BMI.  Orders:  No orders of the defined types were placed in this encounter.  No orders of the defined types were placed in this encounter.    Procedures: No procedures performed  Clinical Data: No additional findings.  ROS:  All other systems negative, except as noted in the HPI. Review of Systems  Objective: Vital Signs: There were no vitals taken for this visit.  Specialty Comments:  No specialty comments available.  PMFS History: Patient Active Problem List   Diagnosis Date Noted   Lower limb ulcer, calf, left, limited to breakdown of skin ([place]) 04/01/2022   Lymphedema 04/01/2022   Trigger finger, left middle finger 10/26/2021  Trigger finger, right ring finger 10/26/2021   Trigger finger, right middle finger 10/26/2021   History of malignant neoplasm of breast 08/23/2021   OAB (overactive bladder) 07/29/2021   Benign neoplasm of ascending colon    Hx of adenomatous colonic polyps    Occult blood in stools    BMI 40.0-44.9, adult ([place]) 01/20/2021   Cervical prolapse 01/20/2021   Incontinence of feces with fecal urgency 01/20/2021   Irritable bowel syndrome with diarrhea 01/20/2021   Mixed incontinence urge and stress 01/20/2021   Unstable angina ([place]) 10/06/2020   Acute on chronic respiratory failure with hypoxia ([employer]) 02/28/2019   OSA on CPAP 02/28/2019   Diabetes mellitus type 2, uncontrolled, with complications 61/95/0932   Breast cancer ([place]) 02/27/2019   History of cancer chemotherapy 02/27/2019   History of radiation therapy 02/27/2019   Diabetic  neuropathy ([place]) 02/27/2019   HLD (hyperlipidemia) 02/27/2019   Essential hypertension 02/27/2019   QT prolongation 02/27/2019   Pneumonia due to COVID-19 virus 02/24/2019   Diarrhea    Benign neoplasm of cecum    Abdominal pain, epigastric    Gastroesophageal reflux disease    Dependence on nocturnal oxygen therapy 07/10/2017   Open wound of left lower leg 12/26/2016   Vitamin D deficiency 09/14/2016   History of total knee replacement, bilateral 09/14/2016   DJD (degenerative joint disease), cervical 09/14/2016   Spondylosis of lumbar region without myelopathy or radiculopathy 09/14/2016   High risk medication use 05/20/2016   Spinal stenosis, lumbar region, with neurogenic claudication 09/08/2015    Class: Chronic   OSA (obstructive sleep apnea) 10/17/2013   Dyspnea 09/04/2013   Atypical chest pain 09/04/2013   Morbid obesity ([place]) 03/21/2012   Cancer of lower-outer quadrant of female breast ([place]) 03/15/2012   Diabetes mellitus, type II ([place]) 02/22/2011   Coronary artery disease    Hypertension    Hyperlipidemia    OA (osteoarthritis) of knee    Past Medical History:  Diagnosis Date   Actinic keratosis    Anxiety    Asthma    Albuterol inhaler as needed.Pulmicort neb as needed   Breast cancer ([place])    right - lumpectomy    Chronic back pain    spinal stenosis   Coronary artery disease    COVID    uses o2 since at hs   Dependence on nocturnal oxygen therapy 07/10/2017   Pt uses 2 liters 02 at night    Depression    takes CYmbalta and Wellbutrin daily   Diabetes mellitus    not on any meds/controlled by diet   Dyspnea    with exertion occasionally when lies down   GERD (gastroesophageal reflux disease)    takes Omeprazole daily   Headache    oocasionally   History of kidney stones    Hyperlipidemia    not on any meds   Hypertension    takes Benazepril,Bystolic,and Amlodipine daily   IBS (irritable bowel syndrome)    Joint pain    Joint swelling     Livedoid vasculitis    Nocturia    OA (osteoarthritis) of knee    Other seborrheic keratosis    Oxygen deficiency    2 liters at night per pt for OSA- no cpap    Peripheral edema    takes daily as needed   Peripheral neuropathy    Personal history of chemotherapy    Personal history of radiation therapy    Pneumonia    hx of-several yrs ago  RA (rheumatoid arthritis) ([facility])    Urinary frequency    Urinary urgency    Weakness    numbness and tingling mainly in left leg occasionally in right.Tingling/numbness in hands    Family History  Problem Relation Age of Onset   Heart disease Mother    Hyperlipidemia Mother    Lung cancer Father    Diabetes Father    Hyperlipidemia Father    Heart disease Sister    Hypertension Sister    Hyperlipidemia Sister    Heart disease Brother    Diabetes Brother    Hypertension Brother    Hyperlipidemia Brother    Lung cancer Brother    Heart disease Brother    Hypertension Brother    Hyperlipidemia Brother    Heart disease Brother    Hypertension Brother    Hyperlipidemia Brother    Heart disease Maternal Aunt    Asthma Maternal Uncle    Heart disease Maternal Uncle     Past Surgical History:  Procedure Laterality Date   BIOPSY  06/24/2021   Procedure: BIOPSY;  Surgeon: Ladene Artist, MD;  [facility];  Service: Endoscopy;;   BREAST LUMPECTOMY  1997   RIGHT BREAST   CARDIAC CATHETERIZATION  04/23/2004   EF 60%   cataract surgery Bilateral    COLONOSCOPY     COLONOSCOPY WITH PROPOFOL N/A 08/07/2017   Procedure: COLONOSCOPY WITH PROPOFOL;  Surgeon: Ladene Artist, MD;  [facility];  Service: Endoscopy;  Laterality: N/A;   COLONOSCOPY WITH PROPOFOL N/A 06/24/2021   Procedure: COLONOSCOPY WITH PROPOFOL;  Surgeon: Ladene Artist, MD;  [facility];  Service: Endoscopy;  Laterality: N/A;   CORONARY ARTERY BYPASS GRAFT  2005   LIMA GRAFT TO LAD, SAPHENOUS VEIN GRAFT TO THE FIRST DIAGONAL, AND LEFT  RADIAL ARTERY GRAFT TO THE OM   ESOPHAGOGASTRODUODENOSCOPY (EGD) WITH PROPOFOL N/A 08/07/2017   Procedure: ESOPHAGOGASTRODUODENOSCOPY (EGD) WITH PROPOFOL;  Surgeon: Ladene Artist, MD;  [facility];  Service: Endoscopy;  Laterality: N/A;   FOOT SURGERY Right    JOINT REPLACEMENT     LITHOTRIPSY     LUMBAR LAMINECTOMY/DECOMPRESSION MICRODISCECTOMY N/A 09/08/2015   Procedure: CENTRAL DECOMPRESSIVE LUMBAR LAMINECTOMIES L3-4, L4-5 AND BILATERAL HEMILAMINECTOMY L5-S1;  Surgeon: Jessy Oto, MD;  Location: [facility];  Service: Orthopedics;  Laterality: N/A;   nodule removed from left elbow     POLYPECTOMY  06/24/2021   Procedure: POLYPECTOMY;  Surgeon: Ladene Artist, MD;  [facility];  Service: Endoscopy;;   RIGHT/LEFT HEART CATH AND CORONARY/GRAFT ANGIOGRAPHY N/A 10/06/2020   Procedure: RIGHT/LEFT HEART CATH AND CORONARY/GRAFT ANGIOGRAPHY;  Surgeon: Martinique, Peter M, MD;  Location: [place] [facility];  Service: Cardiovascular;  Laterality: N/A;   SHOULDER ARTHROSCOPY     TOTAL KNEE ARTHROPLASTY Bilateral    TOTAL SHOULDER ARTHROPLASTY     TRIPLE BYPASS  04/27/05   US ECHOCARDIOGRAPHY  01/06/2009   EF 55-60%   WRIST SURGERY Left    Social History   Occupational History   Occupation: retired    Fish farm manager: UNEMPLOYED  Tobacco Use   Smoking status: Never    Passive exposure: Never   Smokeless tobacco: Never  Vaping Use   Vaping Use: Never used  Substance and Sexual Activity   Alcohol use: No    Alcohol/week: 0.0 standard drinks of alcohol   Drug use: Never   Sexual activity: Not on file

## 2022-08-10 ENCOUNTER — Encounter: Payer: Self-pay | Admitting: Gastroenterology

## 2022-08-13 ENCOUNTER — Other Ambulatory Visit: Payer: Self-pay | Admitting: Physician Assistant

## 2022-08-13 DIAGNOSIS — M0609 Rheumatoid arthritis without rheumatoid factor, multiple sites: Secondary | ICD-10-CM

## 2022-08-15 ENCOUNTER — Encounter: Payer: Self-pay | Admitting: *Deleted

## 2022-08-15 NOTE — Telephone Encounter (Signed)
Next Visit: 10/24/2022  Last Visit: 05/10/2022  Labs: 05/10/2022 CBC WNL. Glucose was 128.  Rest of CMP stable.  Eye exam: 07/29/2021 WNL   Current Dose per office note 05/10/2022: Plaquenil 200 mg 1 tablet by mouth twice daily.   [id number] arthritis of multiple sites with negative rheumatoid factor   Last Fill: 03/24/2022  Message sent via my chart to remind patient to update PLQ eye exam  Okay to refill Plaquenil?

## 2022-08-19 ENCOUNTER — Other Ambulatory Visit: Payer: Self-pay | Admitting: Gastroenterology

## 2022-08-24 DIAGNOSIS — M069 Rheumatoid arthritis, unspecified: Secondary | ICD-10-CM | POA: Diagnosis not present

## 2022-08-24 DIAGNOSIS — Z79899 Other long term (current) drug therapy: Secondary | ICD-10-CM | POA: Diagnosis not present

## 2022-08-24 DIAGNOSIS — Z0000 Encounter for general adult medical examination without abnormal findings: Secondary | ICD-10-CM | POA: Diagnosis not present

## 2022-08-24 DIAGNOSIS — M5030 Other cervical disc degeneration, unspecified cervical region: Secondary | ICD-10-CM | POA: Diagnosis not present

## 2022-09-01 DIAGNOSIS — D8989 Other specified disorders involving the immune mechanism, not elsewhere classified: Secondary | ICD-10-CM | POA: Diagnosis not present

## 2022-09-01 DIAGNOSIS — C50919 Malignant neoplasm of unspecified site of unspecified female breast: Secondary | ICD-10-CM | POA: Diagnosis not present

## 2022-09-01 DIAGNOSIS — I2510 Atherosclerotic heart disease of native coronary artery without angina pectoris: Secondary | ICD-10-CM | POA: Diagnosis not present

## 2022-09-01 DIAGNOSIS — I87333 Chronic venous hypertension (idiopathic) with ulcer and inflammation of bilateral lower extremity: Secondary | ICD-10-CM | POA: Diagnosis not present

## 2022-09-01 DIAGNOSIS — R251 Tremor, unspecified: Secondary | ICD-10-CM | POA: Diagnosis not present

## 2022-09-01 DIAGNOSIS — G4733 Obstructive sleep apnea (adult) (pediatric): Secondary | ICD-10-CM | POA: Diagnosis not present

## 2022-09-01 DIAGNOSIS — I10 Essential (primary) hypertension: Secondary | ICD-10-CM | POA: Diagnosis not present

## 2022-09-01 DIAGNOSIS — E1151 Type 2 diabetes mellitus with diabetic peripheral angiopathy without gangrene: Secondary | ICD-10-CM | POA: Diagnosis not present

## 2022-09-12 ENCOUNTER — Other Ambulatory Visit: Payer: Self-pay | Admitting: Gastroenterology

## 2022-09-29 ENCOUNTER — Ambulatory Visit: Payer: Medicare HMO | Admitting: Orthopedic Surgery

## 2022-09-29 DIAGNOSIS — Z79899 Other long term (current) drug therapy: Secondary | ICD-10-CM | POA: Diagnosis not present

## 2022-09-29 DIAGNOSIS — H35372 Puckering of macula, left eye: Secondary | ICD-10-CM | POA: Diagnosis not present

## 2022-09-29 DIAGNOSIS — E119 Type 2 diabetes mellitus without complications: Secondary | ICD-10-CM | POA: Diagnosis not present

## 2022-09-29 DIAGNOSIS — H04123 Dry eye syndrome of bilateral lacrimal glands: Secondary | ICD-10-CM | POA: Diagnosis not present

## 2022-09-29 DIAGNOSIS — L97921 Non-pressure chronic ulcer of unspecified part of left lower leg limited to breakdown of skin: Secondary | ICD-10-CM

## 2022-09-29 LAB — HM DIABETES EYE EXAM

## 2022-10-17 ENCOUNTER — Encounter: Payer: Self-pay | Admitting: Orthopedic Surgery

## 2022-10-17 NOTE — Progress Notes (Signed)
Office Visit Note   Patient: [name]           Date of Birth: 1945/12/04           MRN: [id number] Visit Date: 09/29/2022              Requested by: Crist Infante, MD [address] [place],  [place] [address] PCP: Crist Infante, MD  Chief Complaint  Patient presents with   Left Leg - Follow-up    Calciphylaxis follow up       HPI: Patient is a 77 year old woman who presents in follow-up for calciphylaxis left lower extremity.  Patient did have donated Kerecis tissue graft applied.  Patient has been wearing the compression sock when she can.  She states she is unable to tolerate lymphedema pumps.  Assessment & Plan: Visit Diagnoses:  1. Calciphylaxis of left lower extremity with nonhealing ulcer, limited to breakdown of skin ([place])     Plan: Recommended continue with the compression sock.  Follow-up if she develops new ulceration.  Follow-Up Instructions: No follow-ups on file.   Ortho Exam  Patient is alert, oriented, no adenopathy, well-dressed, normal affect, normal respiratory effort. Examination of the left lower extremity patient has some varicose veins with dermatitis over the gaiter area.  There are 3 pinpoint wounds but no cellulitis no drainage.  Imaging: No results found.   Labs: Lab Results  Component Value Date   HGBA1C 8.1 (H) 02/26/2019   HGBA1C 6.2 (H) 09/03/2015   HGBA1C 6.5 (H) 04/15/2011   CRP <0.8 03/03/2019   CRP <0.8 03/02/2019   CRP <0.8 03/01/2019   LABURIC 7.3 11/13/2020   REPTSTATUS 07/29/2010 FINAL 07/28/2010   CULT NO GROWTH 07/28/2010     Lab Results  Component Value Date   ALBUMIN 4.6 10/02/2020   ALBUMIN 3.4 (L) 03/07/2019   ALBUMIN 3.2 (L) 03/06/2019    Lab Results  Component Value Date   MG 2.3 03/03/2019   MG 2.1 03/02/2019   MG 2.0 03/01/2019   Lab Results  Component Value Date   VD25OH 50.3 01/04/2018   VD25OH 41.8 12/24/2015   VD25OH 38 12/25/2013    No results found for: "PREALBUMIN"    Latest Ref  Rng & Units 05/10/2022   11:08 AM 12/07/2021   11:59 AM 07/08/2021   11:10 AM  CBC EXTENDED  WBC 3.8 - 10.8 Thousand/uL 8.3  7.5  6.8   RBC 3.80 - 5.10 Million/uL 4.63  4.40  4.40   Hemoglobin 11.7 - 15.5 g/dL 14.0  13.1  13.3   HCT 35.0 - 45.0 % 41.5  40.1  40.5   Platelets 140 - 400 Thousand/uL 202  208  178   NEUT# 1,500 - 7,800 cells/uL 4,017  3,630  4,019   Lymph# 850 - 3,900 cells/uL 3,445  3,060  2,094      There is no height or weight on file to calculate BMI.  Orders:  No orders of the defined types were placed in this encounter.  No orders of the defined types were placed in this encounter.    Procedures: No procedures performed  Clinical Data: No additional findings.  ROS:  All other systems negative, except as noted in the HPI. Review of Systems  Objective: Vital Signs: There were no vitals taken for this visit.  Specialty Comments:  No specialty comments available.  PMFS History: Patient Active Problem List   Diagnosis Date Noted   Lower limb ulcer, calf, left, limited to breakdown of  skin ([place]) 04/01/2022   Lymphedema 04/01/2022   Trigger finger, left middle finger 10/26/2021   Trigger finger, right ring finger 10/26/2021   Trigger finger, right middle finger 10/26/2021   History of malignant neoplasm of breast 08/23/2021   OAB (overactive bladder) 07/29/2021   Benign neoplasm of ascending colon    Hx of adenomatous colonic polyps    Occult blood in stools    BMI 40.0-44.9, adult ([place]) 01/20/2021   Cervical prolapse 01/20/2021   Incontinence of feces with fecal urgency 01/20/2021   Irritable bowel syndrome with diarrhea 01/20/2021   Mixed incontinence urge and stress 01/20/2021   Unstable angina ([place]) 10/06/2020   Acute on chronic respiratory failure with hypoxia ([place]) 02/28/2019   OSA on CPAP 02/28/2019   Diabetes mellitus type 2, uncontrolled, with complications [id number]   Breast cancer ([place]) 02/27/2019   History of cancer chemotherapy  02/27/2019   History of radiation therapy 02/27/2019   Diabetic neuropathy ([place]) 02/27/2019   HLD (hyperlipidemia) 02/27/2019   Essential hypertension 02/27/2019   QT prolongation 02/27/2019   Pneumonia due to COVID-19 virus 02/24/2019   Diarrhea    Benign neoplasm of cecum    Abdominal pain, epigastric    Gastroesophageal reflux disease    Dependence on nocturnal oxygen therapy 07/10/2017   Open wound of left lower leg 12/26/2016   Vitamin D deficiency 09/14/2016   History of total knee replacement, bilateral 09/14/2016   DJD (degenerative joint disease), cervical 09/14/2016   Spondylosis of lumbar region without myelopathy or radiculopathy 09/14/2016   High risk medication use 05/20/2016   Spinal stenosis, lumbar region, with neurogenic claudication 09/08/2015    Class: Chronic   OSA (obstructive sleep apnea) 10/17/2013   Dyspnea 09/04/2013   Atypical chest pain 09/04/2013   Morbid obesity ([place]) 03/21/2012   Cancer of lower-outer quadrant of female breast ([place]) 03/15/2012   Diabetes mellitus, type II ([place]) 02/22/2011   Coronary artery disease    Hypertension    Hyperlipidemia    OA (osteoarthritis) of knee    Past Medical History:  Diagnosis Date   Actinic keratosis    Anxiety    Asthma    Albuterol inhaler as needed.Pulmicort neb as needed   Breast cancer ([place])    right - lumpectomy    Chronic back pain    spinal stenosis   Coronary artery disease    COVID    uses o2 since at hs   Dependence on nocturnal oxygen therapy 07/10/2017   Pt uses 2 liters 02 at night    Depression    takes CYmbalta and Wellbutrin daily   Diabetes mellitus    not on any meds/controlled by diet   Dyspnea    with exertion occasionally when lies down   GERD (gastroesophageal reflux disease)    takes Omeprazole daily   Headache    oocasionally   History of kidney stones    Hyperlipidemia    not on any meds   Hypertension    takes Benazepril,Bystolic,and Amlodipine daily   IBS  (irritable bowel syndrome)    Joint pain    Joint swelling    Livedoid vasculitis    Nocturia    OA (osteoarthritis) of knee    Other seborrheic keratosis    Oxygen deficiency    2 liters at night per pt for OSA- no cpap    Peripheral edema    takes daily as needed   Peripheral neuropathy    Personal history of chemotherapy  Personal history of radiation therapy    Pneumonia    hx of-several yrs ago   RA (rheumatoid arthritis) ([place])    Urinary frequency    Urinary urgency    Weakness    numbness and tingling mainly in left leg occasionally in right.Tingling/numbness in hands    Family History  Problem Relation Age of Onset   Heart disease Mother    Hyperlipidemia Mother    Lung cancer Father    Diabetes Father    Hyperlipidemia Father    Heart disease Sister    Hypertension Sister    Hyperlipidemia Sister    Heart disease Brother    Diabetes Brother    Hypertension Brother    Hyperlipidemia Brother    Lung cancer Brother    Heart disease Brother    Hypertension Brother    Hyperlipidemia Brother    Heart disease Brother    Hypertension Brother    Hyperlipidemia Brother    Heart disease Maternal Aunt    Asthma Maternal Uncle    Heart disease Maternal Uncle     Past Surgical History:  Procedure Laterality Date   BIOPSY  06/24/2021   Procedure: BIOPSY;  Surgeon: Ladene Artist, MD;  [facility];  Service: Endoscopy;;   BREAST LUMPECTOMY  1997   RIGHT BREAST   CARDIAC CATHETERIZATION  04/23/2004   EF 60%   cataract surgery Bilateral    COLONOSCOPY     COLONOSCOPY WITH PROPOFOL N/A 08/07/2017   Procedure: COLONOSCOPY WITH PROPOFOL;  Surgeon: Ladene Artist, MD;  [facility];  Service: Endoscopy;  Laterality: N/A;   COLONOSCOPY WITH PROPOFOL N/A 06/24/2021   Procedure: COLONOSCOPY WITH PROPOFOL;  Surgeon: Ladene Artist, MD;  [facility];  Service: Endoscopy;  Laterality: N/A;   CORONARY ARTERY BYPASS GRAFT  2005   LIMA GRAFT  TO LAD, SAPHENOUS VEIN GRAFT TO THE FIRST DIAGONAL, AND LEFT RADIAL ARTERY GRAFT TO THE OM   ESOPHAGOGASTRODUODENOSCOPY (EGD) WITH PROPOFOL N/A 08/07/2017   Procedure: ESOPHAGOGASTRODUODENOSCOPY (EGD) WITH PROPOFOL;  Surgeon: Ladene Artist, MD;  [facility];  Service: Endoscopy;  Laterality: N/A;   FOOT SURGERY Right    JOINT REPLACEMENT     LITHOTRIPSY     LUMBAR LAMINECTOMY/DECOMPRESSION MICRODISCECTOMY N/A 09/08/2015   Procedure: CENTRAL DECOMPRESSIVE LUMBAR LAMINECTOMIES L3-4, L4-5 AND BILATERAL HEMILAMINECTOMY L5-S1;  Surgeon: Jessy Oto, MD;  Location: [facility];  Service: Orthopedics;  Laterality: N/A;   nodule removed from left elbow     POLYPECTOMY  06/24/2021   Procedure: POLYPECTOMY;  Surgeon: Ladene Artist, MD;  [facility];  Service: Endoscopy;;   RIGHT/LEFT HEART CATH AND CORONARY/GRAFT ANGIOGRAPHY N/A 10/06/2020   Procedure: RIGHT/LEFT HEART CATH AND CORONARY/GRAFT ANGIOGRAPHY;  Surgeon: Martinique, Peter M, MD;  Location: [place] [facility];  Service: Cardiovascular;  Laterality: N/A;   SHOULDER ARTHROSCOPY     TOTAL KNEE ARTHROPLASTY Bilateral    TOTAL SHOULDER ARTHROPLASTY     TRIPLE BYPASS  04/27/05   US ECHOCARDIOGRAPHY  01/06/2009   EF 55-60%   WRIST SURGERY Left    Social History   Occupational History   Occupation: retired    Fish farm manager: UNEMPLOYED  Tobacco Use   Smoking status: Never    Passive exposure: Never   Smokeless tobacco: Never  Vaping Use   Vaping Use: Never used  Substance and Sexual Activity   Alcohol use: No    Alcohol/week: 0.0 standard drinks of alcohol   Drug use: Never  Sexual activity: Not on file

## 2022-10-24 ENCOUNTER — Ambulatory Visit: Payer: Medicare HMO | Admitting: Rheumatology

## 2022-10-25 ENCOUNTER — Other Ambulatory Visit: Payer: Self-pay | Admitting: Physician Assistant

## 2022-10-25 DIAGNOSIS — M0609 Rheumatoid arthritis without rheumatoid factor, multiple sites: Secondary | ICD-10-CM

## 2022-10-26 NOTE — Telephone Encounter (Signed)
Last Fill: 08/15/2022  Eye exam: 07/29/2021 WNL    Labs: 05/10/2022 CBC WNL. Glucose was 128.  Rest of CMP stable.  Next Visit: 11/22/2022  Last Visit: 05/10/2022  DX: Rheumatoid arthritis of multiple sites with negative rheumatoid factor   Current Dose per office note 05/10/2022: Plaquenil 200 mg 1 tablet by mouth twice daily.   Patient will update labs at upcoming appointment on 11/22/2022. Patient states she has updated her PLQ eye exam. Patient will call eye doctor and have them send results.   Okay to refill Plaquenil?

## 2022-11-09 NOTE — Progress Notes (Signed)
Office Visit Note  Patient: [name]             Date of Birth: [birth year]           MRN: [id number]             PCP: Rodrigo Ran, MD Referring: Rodrigo Ran, MD Visit Date: 11/22/2022 Occupation: @GUAROCC @  Subjective:  Pain in multiple joints and legs  History of Present Illness: [name] is a 77 y.o. female with seronegative rheumatoid arthritis and degenerative disc disease.  She states she has been having recurrent ulcers on her lower extremities.  She is not able to go to the wound care due to the expense.  She went to see Dr. Lajoyce Corners who advised her to use compression socks.  He also told her to come back as needed.  She states she is getting couple of lesions on her left leg where she has been using Betadine.  She continues to have redness and soreness on her lower legs.  She has generalized pain and discomfort which she describes in her neck, and upper back.  She also continues to have discomfort in her hands and her feet.  She continues to take hydroxychloroquine 200 mg p.o. twice a day without any side effects.  She states she had an eye examination this year and we will get the results.    Activities of Daily Living:  Patient reports morning stiffness for 2-2.5 hours.   Patient Reports nocturnal pain.  Difficulty dressing/grooming: Reports Difficulty climbing stairs: Reports Difficulty getting out of chair: Reports Difficulty using hands for taps, buttons, cutlery, and/or writing: Reports  Review of Systems  Constitutional:  Positive for fatigue.  HENT:  Positive for mouth dryness. Negative for mouth sores.   Eyes:  Negative for dryness.  Respiratory:  Negative for difficulty breathing.   Cardiovascular:  Negative for palpitations.  Gastrointestinal:  Positive for diarrhea. Negative for blood in stool and constipation.  Endocrine: Negative for increased urination.  Genitourinary:  Positive for involuntary urination.  Musculoskeletal:  Positive for joint pain,  gait problem, joint pain, joint swelling, myalgias, muscle weakness, morning stiffness, muscle tenderness and myalgias.  Skin:  Positive for color change. Negative for rash, hair loss and sensitivity to sunlight.  Allergic/Immunologic: Positive for susceptible to infections.  Neurological:  Positive for headaches. Negative for dizziness.  Hematological:  Negative for swollen glands.  Psychiatric/Behavioral:  Positive for depressed mood. Negative for sleep disturbance. The patient is nervous/anxious.     PMFS History:  Patient Active Problem List   Diagnosis Date Noted   Lower limb ulcer, calf, left, limited to breakdown of skin (HCC) 04/01/2022   Lymphedema 04/01/2022   Trigger finger, left middle finger 10/26/2021   Trigger finger, right ring finger 10/26/2021   Trigger finger, right middle finger 10/26/2021   History of malignant neoplasm of breast 08/23/2021   OAB (overactive bladder) 07/29/2021   Benign neoplasm of ascending colon    Hx of adenomatous colonic polyps    Occult blood in stools    BMI 40.0-44.9, adult (HCC) 01/20/2021   Cervical prolapse 01/20/2021   Incontinence of feces with fecal urgency 01/20/2021   Irritable bowel syndrome with diarrhea 01/20/2021   Mixed incontinence urge and stress 01/20/2021   Unstable angina (HCC) 10/06/2020   Acute on chronic respiratory failure with hypoxia (HCC) 02/28/2019   OSA on CPAP 02/28/2019   Diabetes mellitus type 2, uncontrolled, with complications 02/28/2019   Breast cancer (HCC) 02/27/2019  History of cancer chemotherapy 02/27/2019   History of radiation therapy 02/27/2019   Diabetic neuropathy (HCC) 02/27/2019   HLD (hyperlipidemia) 02/27/2019   Essential hypertension 02/27/2019   QT prolongation 02/27/2019   Pneumonia due to COVID-19 virus 02/24/2019   Diarrhea    Benign neoplasm of cecum    Abdominal pain, epigastric    Gastroesophageal reflux disease    Dependence on nocturnal oxygen therapy 07/10/2017   Open  wound of left lower leg 12/26/2016   Vitamin D deficiency 09/14/2016   History of total knee replacement, bilateral 09/14/2016   DJD (degenerative joint disease), cervical 09/14/2016   Spondylosis of lumbar region without myelopathy or radiculopathy 09/14/2016   High risk medication use 05/20/2016   Spinal stenosis, lumbar region, with neurogenic claudication 09/08/2015    Class: Chronic   OSA (obstructive sleep apnea) 10/17/2013   Dyspnea 09/04/2013   Atypical chest pain 09/04/2013   Morbid obesity (HCC) 03/21/2012   Cancer of lower-outer quadrant of female breast (HCC) 03/15/2012   Diabetes mellitus, type II (HCC) 02/22/2011   Coronary artery disease    Hypertension    Hyperlipidemia    OA (osteoarthritis) of knee     Past Medical History:  Diagnosis Date   Actinic keratosis    Anxiety    Asthma    Albuterol inhaler as needed.Pulmicort neb as needed   Breast cancer (HCC)    right - lumpectomy    Chronic back pain    spinal stenosis   Coronary artery disease    COVID    uses o2 since at hs   Dependence on nocturnal oxygen therapy 07/10/2017   Pt uses 2 liters 02 at night    Depression    takes CYmbalta and Wellbutrin daily   Diabetes mellitus    not on any meds/controlled by diet   Dyspnea    with exertion occasionally when lies down   GERD (gastroesophageal reflux disease)    takes Omeprazole daily   Headache    oocasionally   History of kidney stones    Hyperlipidemia    not on any meds   Hypertension    takes Benazepril,Bystolic,and Amlodipine daily   IBS (irritable bowel syndrome)    Joint pain    Joint swelling    Livedoid vasculitis    Nocturia    OA (osteoarthritis) of knee    Other seborrheic keratosis    Oxygen deficiency    2 liters at night per pt for OSA- no cpap    Peripheral edema    takes daily as needed   Peripheral neuropathy    Personal history of chemotherapy    Personal history of radiation therapy    Pneumonia    hx of-several yrs  ago   RA (rheumatoid arthritis) (HCC)    Urinary frequency    Urinary urgency    Weakness    numbness and tingling mainly in left leg occasionally in right.Tingling/numbness in hands    Family History  Problem Relation Age of Onset   Heart disease Mother    Hyperlipidemia Mother    Lung cancer Father    Diabetes Father    Hyperlipidemia Father    Heart disease Sister    Hypertension Sister    Hyperlipidemia Sister    Heart disease Brother    Diabetes Brother    Hypertension Brother    Hyperlipidemia Brother    Lung cancer Brother    Heart disease Brother    Hypertension Brother    Hyperlipidemia  Brother    Heart disease Brother    Hypertension Brother    Hyperlipidemia Brother    Heart disease Maternal Aunt    Asthma Maternal Uncle    Heart disease Maternal Uncle    Past Surgical History:  Procedure Laterality Date   BIOPSY  06/24/2021   Procedure: BIOPSY;  Surgeon: Meryl Dare, MD;  Location: [facility];  Service: Endoscopy;;   BREAST LUMPECTOMY  1997   RIGHT BREAST   CARDIAC CATHETERIZATION  04/23/2004   EF 60%   cataract surgery Bilateral    COLONOSCOPY     COLONOSCOPY WITH PROPOFOL N/A 08/07/2017   Procedure: COLONOSCOPY WITH PROPOFOL;  Surgeon: Meryl Dare, MD;  Location: [facility];  Service: Endoscopy;  Laterality: N/A;   COLONOSCOPY WITH PROPOFOL N/A 06/24/2021   Procedure: COLONOSCOPY WITH PROPOFOL;  Surgeon: Meryl Dare, MD;  Location: [facility];  Service: Endoscopy;  Laterality: N/A;   CORONARY ARTERY BYPASS GRAFT  2005   LIMA GRAFT TO LAD, SAPHENOUS VEIN GRAFT TO THE FIRST DIAGONAL, AND LEFT RADIAL ARTERY GRAFT TO THE OM   ESOPHAGOGASTRODUODENOSCOPY (EGD) WITH PROPOFOL N/A 08/07/2017   Procedure: ESOPHAGOGASTRODUODENOSCOPY (EGD) WITH PROPOFOL;  Surgeon: Meryl Dare, MD;  Location: [facility];  Service: Endoscopy;  Laterality: N/A;   FOOT SURGERY Right    JOINT REPLACEMENT     LITHOTRIPSY     LUMBAR LAMINECTOMY/DECOMPRESSION  MICRODISCECTOMY N/A 09/08/2015   Procedure: CENTRAL DECOMPRESSIVE LUMBAR LAMINECTOMIES L3-4, L4-5 AND BILATERAL HEMILAMINECTOMY L5-S1;  Surgeon: Kerrin Champagne, MD;  Location: [facility];  Service: Orthopedics;  Laterality: N/A;   nodule removed from left elbow     POLYPECTOMY  06/24/2021   Procedure: POLYPECTOMY;  Surgeon: Meryl Dare, MD;  Location: [facility];  Service: Endoscopy;;   RIGHT/LEFT HEART CATH AND CORONARY/GRAFT ANGIOGRAPHY N/A 10/06/2020   Procedure: RIGHT/LEFT HEART CATH AND CORONARY/GRAFT ANGIOGRAPHY;  Surgeon: [place], Peter M, MD;  Location: [facility];  Service: Cardiovascular;  Laterality: N/A;   SHOULDER ARTHROSCOPY     TOTAL KNEE ARTHROPLASTY Bilateral    TOTAL SHOULDER ARTHROPLASTY     TRIPLE BYPASS  04/27/05   US ECHOCARDIOGRAPHY  01/06/2009   EF 55-60%   WRIST SURGERY Left    Social History   Social History Narrative   Not on file   Immunization History  Administered Date(s) Administered   Influenza Split 04/24/2013   PFIZER(Purple Top)SARS-COV-2 Vaccination 09/01/2019, 09/25/2019, 04/04/2020   Pneumococcal Conjugate-13 08/26/2016   Zoster Recombinat (Shingrix) 10/11/2017, 10/11/2017, 12/22/2017, 12/22/2017     Objective: Vital Signs: BP 138/80 (BP Location: Left Arm, Patient Position: Sitting, Cuff Size: Normal)   Pulse (!) 58   Resp 13   Ht 5\' 4"  (1.626 m)   Wt 234 lb 9.6 oz (106.4 kg)   BMI 40.27 kg/m    Physical Exam Vitals and nursing note reviewed.  Constitutional:      Appearance: She is well-developed.  HENT:     Head: Normocephalic and atraumatic.  Eyes:     Conjunctiva/sclera: Conjunctivae normal.  Cardiovascular:     Rate and Rhythm: Normal rate and regular rhythm.     Heart sounds: Normal heart sounds.  Pulmonary:     Effort: Pulmonary effort is normal.     Breath sounds: Normal breath sounds.  Abdominal:     General: Bowel sounds are normal.     Palpations: Abdomen is soft.  Musculoskeletal:     Cervical back: Normal  range of motion.     Right  lower leg: Edema present.     Left lower leg: Edema present.  Lymphadenopathy:     Cervical: No cervical adenopathy.  Skin:    General: Skin is warm and dry.     Capillary Refill: Capillary refill takes less than 2 seconds.  Neurological:     Mental Status: She is alert and oriented to person, place, and time.  Psychiatric:        Behavior: Behavior normal.      Musculoskeletal Exam: She had limited lateral rotation of the cervical spine.  Thoracic kyphosis was noted.  Shoulder joints, elbow joints, wrist joints, MCPs PIPs and DIPs in good range of motion with no synovitis.  Rheumatoid nodule was palpable over the left forearm.  Hip joints were difficult to assess.  Knee joints in good range of motion.  There was no tenderness over ankles or MTPs.  CDAI Exam: CDAI Score: -- Patient Global: --; Provider Global: -- Swollen: --; Tender: -- Joint Exam 11/22/2022   No joint exam has been documented for this visit   There is currently no information documented on the homunculus. Go to the Rheumatology activity and complete the homunculus joint exam.  Investigation: No additional findings.  Imaging: No results found.  Recent Labs: Lab Results  Component Value Date   WBC 8.3 05/10/2022   HGB 14.0 05/10/2022   PLT 202 05/10/2022   NA 140 05/10/2022   K 4.7 05/10/2022   CL 101 05/10/2022   CO2 24 05/10/2022   GLUCOSE 128 (H) 05/10/2022   BUN 17 05/10/2022   CREATININE 0.56 (L) 05/10/2022   BILITOT 0.5 05/10/2022   ALKPHOS 54 10/02/2020   AST 30 05/10/2022   ALT 21 05/10/2022   PROT 7.2 05/10/2022   ALBUMIN 4.6 10/02/2020   CALCIUM 9.7 05/10/2022   GFRAA 104 03/02/2020    Speciality Comments: PLQ Eye Exam: 07/29/2021 WNL @ [facility]   Follow up in 6 months  November 18, 2020 high-resolution CT-according to Dr. Vassie Loll lung functions were preserved and no immunosuppressive therapy needed.  Procedures:  No procedures performed Allergies:  Statins, Alirocumab, Aricept [donepezil], Atorvastatin, Crestor [rosuvastatin], Diazepam, Dry skin treatment [albolene], Evolocumab, Ezetimibe, Fenofibrate micronized, Fentanyl, Hydrocodone, Loratadine, Metformin, Morphine and related, Niaspan [niacin], Phentermine-topiramate, Welchol [colesevelam], Zocor [simvastatin], and Azithromycin   Assessment / Plan:     Visit Diagnoses: Rheumatoid arthritis of multiple sites with negative rheumatoid factor (HCC)-patient complains of pain and discomfort in multiple joints.  She complains of discomfort in her neck, lower back, hands, feet.  No synovitis was noted on the examination.  She continues to be on hydroxychloroquine 200 mg p.o. twice daily.  I do not see need for more aggressive treatment.  Rheumatoid nodule was noted on her left forearm.  High risk medication use - Plaquenil 200 mg 1 tablet by mouth twice daily.  (MTX was discontinued in July 2019 due to lower extremity ulcers). PLQ Eye Exam: 07/29/2021 -Labs obtained on May 10, 2022 CBC and CMP were normal.  Patient had an eye examination this year and will have lab results forwarded to [place].  Will obtain labs today.  Plan: CBC with Differential/Platelet, COMPLETE METABOLIC PANEL WITH GFR  Pain in both hands-she complains of pain and tingling in her bilateral hands.  No synovitis was noted.  History of total knee replacement, bilateral-she complains of discomfort in her bilateral knee joints.  Her knee joints were  in good range of motion.  DDD (degenerative disc disease), cervical-she has chronic discomfort and  is followed at Ortho care.  DDD (degenerative disc disease), lumbar -chronic pain.  X-rays of lumbar spine on 08/19/19 consistent with multilevel spondylosis and DDD.  She is followed at Ortho care.  Ulcer of left lower extremity, limited to breakdown of skin (HCC) - Non-healing x3 years+.  Patient does not want to go to wound care as it is too expensive.  She had 2 lesions on her left lower  extremities.  She has been using topical Betadine.  She is followed by Dr. Lajoyce Corners. I reviewed the x-rays done by Dr. Lajoyce Corners.  The x-rays show calciphylaxis cutis.  She was previously on antibiotics.  I will place her on minocycline.  Indications side effects contraindications were discussed and a handout was placed in the AVS.  A prescription for minocycline 100 mg p.o. twice daily total 60 tablets with 2 refills were sent.  Patient was advised to stop minocycline if she develops a rash.- Plan: minocycline (MINOCIN) 100 MG capsule.  Patient was also strongly advised to follow-up with the dermatologist.   Calciphylaxis cutis- plan: Phosphorus, Parathyroid hormone, intact (no Ca), minocycline (MINOCIN) 100 MG capsule  Varicose veins of bilateral lower extremities with other complications - Followed by Dr. Randie Heinz (vascular surgery).  Patient is waiting on approval for lymphedema pump.  Other medical problems are listed as follows:  History of hypertension-blood pressure was 138/80 today.  History of diabetes mellitus-followed by Dr. Waynard Edwards.  History of vitamin D deficiency  History of sleep apnea  History of breast cancer  Pedal edema  History of coronary artery disease  Orders: Orders Placed This Encounter  Procedures   CBC with Differential/Platelet   COMPLETE METABOLIC PANEL WITH GFR   Phosphorus   Parathyroid hormone, intact (no Ca)   Meds ordered this encounter  Medications   minocycline (MINOCIN) 100 MG capsule    Sig: Take 1 capsule (100 mg total) by mouth 2 (two) times daily.    Dispense:  60 capsule    Refill:  2    Face-to-face time spent with patient was 50 minutes. Greater than 50% of time was spent in counseling and coordination of care.  Follow-Up Instructions: Return in about 3 months (around 02/21/2023) for Rheumatoid arthritis, Osteoarthritis.   Pollyann Savoy, MD  Note - This record has been created using Animal nutritionist.  Chart creation errors have been  sought, but may not always  have been located. Such creation errors do not reflect on  the standard of medical care.

## 2022-11-22 ENCOUNTER — Ambulatory Visit: Payer: Medicare HMO | Attending: Rheumatology | Admitting: Rheumatology

## 2022-11-22 ENCOUNTER — Encounter: Payer: Self-pay | Admitting: Rheumatology

## 2022-11-22 VITALS — BP 138/80 | HR 58 | Resp 13 | Ht 64.0 in | Wt 234.6 lb

## 2022-11-22 DIAGNOSIS — L97921 Non-pressure chronic ulcer of unspecified part of left lower leg limited to breakdown of skin: Secondary | ICD-10-CM

## 2022-11-22 DIAGNOSIS — Z8639 Personal history of other endocrine, nutritional and metabolic disease: Secondary | ICD-10-CM

## 2022-11-22 DIAGNOSIS — M79641 Pain in right hand: Secondary | ICD-10-CM

## 2022-11-22 DIAGNOSIS — Z853 Personal history of malignant neoplasm of breast: Secondary | ICD-10-CM

## 2022-11-22 DIAGNOSIS — R600 Localized edema: Secondary | ICD-10-CM

## 2022-11-22 DIAGNOSIS — M5136 Other intervertebral disc degeneration, lumbar region: Secondary | ICD-10-CM

## 2022-11-22 DIAGNOSIS — I83893 Varicose veins of bilateral lower extremities with other complications: Secondary | ICD-10-CM

## 2022-11-22 DIAGNOSIS — Z79899 Other long term (current) drug therapy: Secondary | ICD-10-CM

## 2022-11-22 DIAGNOSIS — M79642 Pain in left hand: Secondary | ICD-10-CM

## 2022-11-22 DIAGNOSIS — M5030 Other cervical disc degeneration, unspecified cervical region: Secondary | ICD-10-CM | POA: Diagnosis not present

## 2022-11-22 DIAGNOSIS — Z96653 Presence of artificial knee joint, bilateral: Secondary | ICD-10-CM

## 2022-11-22 DIAGNOSIS — Z8679 Personal history of other diseases of the circulatory system: Secondary | ICD-10-CM

## 2022-11-22 DIAGNOSIS — M0609 Rheumatoid arthritis without rheumatoid factor, multiple sites: Secondary | ICD-10-CM | POA: Diagnosis not present

## 2022-11-22 DIAGNOSIS — Z8669 Personal history of other diseases of the nervous system and sense organs: Secondary | ICD-10-CM

## 2022-11-22 MED ORDER — MINOCYCLINE HCL 100 MG PO CAPS: 100.0000 mg | ORAL_CAPSULE | Freq: Two times a day (BID) | ORAL | 2 refills | Status: DC

## 2022-11-22 NOTE — Patient Instructions (Addendum)
Vaccines You are taking a medication(s) that can suppress your immune system.  The following immunizations are recommended: Flu annually Covid-19  Td/Tdap (tetanus, diphtheria, pertussis) every 10 years Pneumonia (Prevnar 15 then Pneumovax 23 at least 1 year apart.  Alternatively, can take Prevnar 20 without needing additional dose) Shingrix: 2 doses from 4 weeks to 6 months apart  Please check with your PCP to make sure you are up to date.   Minocycline Capsules or Tablets What is this medication? MINOCYCLINE (mi noe SYE kleen) treats infections caused by bacteria. It may also be used to treat acne. It belongs to a group of medications called tetracycline antibiotics. It will not treat colds, the flu, or infections caused by viruses. This medicine may be used for other purposes; ask your health care provider or pharmacist if you have questions. COMMON BRAND NAME(S): Cleeravue-M, Dynacin, Minocin, Minocin Kit, Minocin PAC, Myrac What should I tell my care team before I take this medication? They need to know if you have any of these conditions: Kidney disease Liver disease An unusual or allergic reaction to minocycline, other medications, foods, dyes, or preservatives Pregnant or trying to get pregnant Breastfeeding How should I use this medication? Take this medication by mouth with a full glass of water. Follow the directions on the prescription label. You can take it with or without food. If it upsets your stomach, take it with food. Take your medication at regular intervals. Do not take your medication more often than directed. Take all of your medication as directed even if you think you are better. Do not skip doses or stop your medication early. Talk to your care team about the use of this medication in children. While this medication may be prescribed for children as young as 8 years for selected conditions, precautions do apply. Overdosage: If you think you have taken too much of  this medicine contact a poison control center or emergency room at once. NOTE: This medicine is only for you. Do not share this medicine with others. What if I miss a dose? If you miss a dose, take it as soon as you can. If it is almost time for your next dose, take only that dose. Do not take double or extra doses. What may interact with this medication? Do not take this medication with any of the following: Acitretin This medication may also interact with the following: Antacids Certain medications that treat or prevent blood clots, such as warfarin Ergot alkaloids, such as dihydroergotamine, ergonovine, ergotamine, methylergonovine Estrogen or progestin hormones Iron supplements Isotretinoin Methoxyflurane Other antibiotics, such as penicillin This list may not describe all possible interactions. Give your health care provider a list of all the medicines, herbs, non-prescription drugs, or dietary supplements you use. Also tell them if you smoke, drink alcohol, or use illegal drugs. Some items may interact with your medicine. What should I watch for while using this medication? Tell your care team if your symptoms do not start to get better or if they get worse. This medication may cause serious skin reactions. They can happen weeks to months after starting the medication. Contact your care team right away if you notice fevers or flu-like symptoms with a rash. The rash may be red or purple and then turn into blisters or peeling of the skin. You may also notice a red rash with swelling of the face, lips, or lymph nodes in your neck or under your arms. Do not treat diarrhea with over the counter products.  Contact your care team if you have diarrhea that lasts more than 2 days or if it is severe and watery. This medication can make you more sensitive to the sun. Keep out of the sun. If you cannot avoid being in the sun, wear protective clothing and sunscreen. Do not use sun lamps, tanning beds,  or tanning booths. Tell your care team right away if you have any change in your eyesight. Estrogen and progestin hormones may not work as well while you are taking this medication. A barrier contraceptive, such as a condom or diaphragm, is recommended if you are using these hormones for contraception. Talk to your care team about effective forms of contraception. This medication may affect your coordination, reaction time, or judgment. Do not drive or operate machinery until you know how this medication affects you. Sit up or stand slowly to reduce the risk of dizzy or fainting spells. Drinking alcohol with this medication can increase the risk of these side effects. If you are being treated for a sexually transmitted infection (STI), avoid sexual contact until you have finished your treatment. Your partner may also need treatment. What side effects may I notice from receiving this medication? Side effects that you should report to your care team as soon as possible: Allergic reactions--skin rash, itching, hives, swelling of the face, lips, tongue, or throat Increased pressure around the brain--severe headache, blurry vision, change in vision, nausea, vomiting Joint pain Liver injury--right upper belly pain, loss of appetite, nausea, light-colored stool, dark yellow or brown urine, yellowing skin or eyes, unusual weakness or fatigue Pain or trouble swallowing Rash, fever, and swollen lymph nodes Redness, blistering, peeling, or loosening of the skin, including inside the mouth Severe diarrhea, fever Unusual vaginal discharge, itching, or odor Side effects that usually do not require medical attention (report to your care team if they continue or are bothersome): Change in tooth color Diarrhea Dizziness Fatigue Headache This list may not describe all possible side effects. Call your doctor for medical advice about side effects. You may report side effects to FDA at 1-800-FDA-1088. Where should  I keep my medication? Keep out of the reach of children. Store at room temperature between 20 and 25 degrees C (68 and 77 degrees F). Throw away any unused medication after the expiration date. NOTE: This sheet is a summary. It may not cover all possible information. If you have questions about this medicine, talk to your doctor, pharmacist, or health care provider.  2023 Elsevier/Gold Standard (2020-09-18 00:00:00)

## 2022-11-23 LAB — COMPLETE METABOLIC PANEL WITH GFR
AG Ratio: 1.8 (calc) (ref 1.0–2.5)
ALT: 19 U/L (ref 6–29)
AST: 29 U/L (ref 10–35)
Albumin: 4.6 g/dL (ref 3.6–5.1)
Alkaline phosphatase (APISO): 41 U/L (ref 37–153)
BUN/Creatinine Ratio: 31 (calc) — ABNORMAL HIGH (ref 6–22)
BUN: 18 mg/dL (ref 7–25)
CO2: 32 mmol/L (ref 20–32)
Calcium: 9.9 mg/dL (ref 8.6–10.4)
Chloride: 100 mmol/L (ref 98–110)
Creat: 0.59 mg/dL — ABNORMAL LOW (ref 0.60–1.00)
Globulin: 2.5 g/dL (calc) (ref 1.9–3.7)
Glucose, Bld: 130 mg/dL — ABNORMAL HIGH (ref 65–99)
Potassium: 4.5 mmol/L (ref 3.5–5.3)
Sodium: 140 mmol/L (ref 135–146)
Total Bilirubin: 0.4 mg/dL (ref 0.2–1.2)
Total Protein: 7.1 g/dL (ref 6.1–8.1)
eGFR: 93 mL/min/{1.73_m2} (ref >=60)

## 2022-11-23 LAB — CBC WITH DIFFERENTIAL/PLATELET
Absolute Monocytes: 688 cells/uL (ref 200–950)
Basophils Absolute: 37 cells/uL (ref 0–200)
Basophils Relative: 0.5 %
Eosinophils Absolute: 89 cells/uL (ref 15–500)
Eosinophils Relative: 1.2 %
HCT: 38.7 % (ref 35.0–45.0)
Hemoglobin: 13 g/dL (ref 11.7–15.5)
Lymphs Abs: 3123 cells/uL (ref 850–3900)
MCH: 30.3 pg (ref 27.0–33.0)
MCHC: 33.6 g/dL (ref 32.0–36.0)
MCV: 90.2 fL (ref 80.0–100.0)
MPV: 12.6 fL — ABNORMAL HIGH (ref 7.5–12.5)
Monocytes Relative: 9.3 %
Neutro Abs: 3463 cells/uL (ref 1500–7800)
Neutrophils Relative %: 46.8 %
Platelets: 174 10*3/uL (ref 140–400)
RBC: 4.29 10*6/uL (ref 3.80–5.10)
RDW: 12.8 % (ref 11.0–15.0)
Total Lymphocyte: 42.2 %
WBC: 7.4 10*3/uL (ref 3.8–10.8)

## 2022-11-23 LAB — PHOSPHORUS: Phosphorus: 4.2 mg/dL (ref 2.1–4.3)

## 2022-11-23 LAB — PARATHYROID HORMONE, INTACT (NO CA): PTH: 52 pg/mL (ref 16–77)

## 2022-11-23 NOTE — Progress Notes (Signed)
CBC and CMP are stable.  Glucose is elevated at 130.  Please forward results to her PCP.  PTH is normal.  Phosphorus is normal.

## 2022-11-24 ENCOUNTER — Ambulatory Visit: Payer: Medicare HMO | Attending: Cardiology | Admitting: Cardiology

## 2022-11-24 ENCOUNTER — Encounter: Payer: Self-pay | Admitting: Cardiology

## 2022-11-24 VITALS — BP 128/84 | HR 59 | Ht 64.0 in

## 2022-11-24 DIAGNOSIS — R0602 Shortness of breath: Secondary | ICD-10-CM

## 2022-11-24 DIAGNOSIS — E7800 Pure hypercholesterolemia, unspecified: Secondary | ICD-10-CM

## 2022-11-24 DIAGNOSIS — I25708 Atherosclerosis of coronary artery bypass graft(s), unspecified, with other forms of angina pectoris: Secondary | ICD-10-CM | POA: Diagnosis not present

## 2022-11-24 DIAGNOSIS — I10 Essential (primary) hypertension: Secondary | ICD-10-CM

## 2022-11-24 DIAGNOSIS — I272 Pulmonary hypertension, unspecified: Secondary | ICD-10-CM | POA: Diagnosis not present

## 2022-11-24 DIAGNOSIS — Z6841 Body Mass Index (BMI) 40.0 and over, adult: Secondary | ICD-10-CM | POA: Diagnosis not present

## 2022-11-24 NOTE — Progress Notes (Signed)
[name] Date of Birth: 1945-09-07   History of Present Illness: [name] is seen for follow up CAD.  She is s/p CABG in 2005 with LIMA to LAD and left radial graft to the first diagonal. Myoview in March 2018 was normal.  Echo in 2015 unremarkable. She was seen by Dr. Vassie Loll and diagnosed with OSA by at home testing. She is currently using nocturnal oxygen. In 2018 she underwent lumbar laminectomy complicated by a dural tear that was repaired. Since that time she has continued to have back pain that limits her activity. When seen in 2019 complained of dyspnea. Echo showed moderate LVH but was otherwise normal. She had followup with pulmonary. PFTs showed moderate restriction.   She was admitted in August 2020 with Covid 19 PNA. Managed with oxygen therapy and steroids, Remdesivir, and Actemsa. She reports since she had Covid she is still on oxygen at night.   When seen March 2022 she complained of sweats with activity and also complains that her chest feels heavy. Worse with activity. To evaluate further she underwent cardiac cath showing patent LIMA to LAD and radial graft to OM. SVG to diagonal was occluded. Disease in distal RCA branches and diagonal not amenable to PCI. Normal LV filling pressures and very mild Pulmonary HTN. Based on findings it was felt symptoms were likely more pulmonary related or deconditioning.   After her last visit she was seen by Pharm D for her hyperlipidemia. Trial of Nexletol recommended. She is taking this every other day. Patient not interested in Leqvio. She is followed by Rheumatology for RA on Plaquenil. She did have the flu a couple of weeks.   She was seen In October by vascular surgery for lymphedema. Venous duplex was normal.   She has chronic leg swelling and ulcers left leg. Recently started on minocycline for cellulitis. She reports her breathing is about the same. No chest pain or palpitations. Takes Nexlitol every other days.    Current  Outpatient Medications on File Prior to Visit  Medication Sig Dispense Refill   ACCU-CHEK GUIDE test strip      acetaminophen (TYLENOL) 500 MG tablet Take 1,000 mg by mouth at bedtime.     albuterol (VENTOLIN HFA) 108 (90 Base) MCG/ACT inhaler Inhale 2 puffs into the lungs every 4 (four) hours as needed for wheezing or shortness of breath (cough, shortness of breath or wheezing.). 6.7 g 0   amLODipine (NORVASC) 5 MG tablet Take 5 mg by mouth in the morning.     aspirin EC 81 MG tablet Take 81 mg by mouth at bedtime. Swallow whole.     b complex vitamins capsule Take 1 capsule by mouth daily.     baclofen (LIORESAL) 10 MG tablet TAKE 1 TABLET BY MOUTH TWICE A DAY AS NEEDED FOR SPASMS 180 tablet 0   blood glucose meter kit and supplies Dispense based on patient and insurance preference. Use up to four times daily as directed. (FOR ICD-10 E10.9, E11.9). 1 each 0   buPROPion (WELLBUTRIN XL) 150 MG 24 hr tablet Take 150 mg by mouth in the morning.     busPIRone (BUSPAR) 7.5 MG tablet Take 7.5 mg by mouth 2 (two) times daily.     Calcium Carb-Cholecalciferol (CALCIUM + D3 PO) Take 1 tablet by mouth daily.     cholecalciferol (VITAMIN D3) 25 MCG (1000 UT) tablet Take 1,000 Units by mouth at bedtime.     clobetasol cream (TEMOVATE) 0.05 % Apply 1 application  topically daily.     Cyanocobalamin (VITAMIN B-12 PO) Take 1 capsule by mouth daily.     dicyclomine (BENTYL) 10 MG capsule TAKE 1 CAPSULE (10 MG TOTAL) BY MOUTH 3 (THREE) TIMES DAILY BEFORE MEALS. 270 capsule 0   DULoxetine (CYMBALTA) 60 MG capsule Take 60 mg by mouth in the morning.     Echinacea 400 MG CAPS Take 400 mg by mouth daily.     ezetimibe (ZETIA) 10 MG tablet Take 1 tablet (10 mg total) by mouth daily. (Patient taking differently: Take 10 mg by mouth every other day.) 30 tablet 0   folic acid (FOLVITE) 1 MG tablet Take 2 tablets (2 mg total) by mouth daily. 180 tablet 4   gabapentin (NEURONTIN) 600 MG tablet Take 600 mg by mouth 3  (three) times daily.      galantamine (RAZADYNE ER) 16 MG 24 hr capsule Take 16 mg by mouth every morning.     HYDROcodone-acetaminophen (NORCO/VICODIN) 5-325 MG tablet Take 1 tablet by mouth daily as needed (Arthritis).     hydroxychloroquine (PLAQUENIL) 200 MG tablet TAKE 1 TABLET TWICE DAILY FOR RHEUMATOID ARTHRITIS 180 tablet 0   loperamide (IMODIUM A-D) 2 MG tablet Take 2 mg by mouth 4 (four) times daily as needed for diarrhea or loose stools.     meloxicam (MOBIC) 15 MG tablet Take 15 mg by mouth 2 (two) times a week. Wednesdays & Sundays     minocycline (MINOCIN) 100 MG capsule Take 1 capsule (100 mg total) by mouth 2 (two) times daily. 60 capsule 2   montelukast (SINGULAIR) 10 MG tablet Take 10 mg by mouth at bedtime.     Multiple Vitamins-Minerals (CENTRUM SILVER PO) Take 1 tablet by mouth daily.     Multiple Vitamins-Minerals (HAIR SKIN NAILS PO) Take 1 tablet by mouth daily.     Multiple Vitamins-Minerals (ZINC PO) Take 1 tablet by mouth daily.     mupirocin ointment (BACTROBAN) 2 % Apply 1 application topically daily.     Nebivolol HCl 20 MG TABS Take 20 mg by mouth at bedtime.     NEXLETOL 180 MG TABS TAKE 1 TABLET BY MOUTH EVERY DAY 90 tablet 3   nitroGLYCERIN (NITROSTAT) 0.4 MG SL tablet nitroglycerin 0.4 mg sublingual tablet 25 tablet 11   olmesartan-hydrochlorothiazide (BENICAR HCT) 40-25 MG tablet Take 1 tablet by mouth in the morning.  5   omeprazole (PRILOSEC) 20 MG capsule Take 20 mg by mouth in the morning.     OVER THE COUNTER MEDICATION Take 1 application  by mouth daily as needed (wound care). Swedish bitters     OVER THE COUNTER MEDICATION LIFEWAVE X39 pain patches     oxybutynin (DITROPAN-XL) 10 MG 24 hr tablet Take 10 mg by mouth daily.     OXYGEN Inhale 2 L into the lungs at bedtime.     Potassium 99 MG TABS Take 99 mg by mouth daily.     Probiotic Product (PROBIOTIC DAILY PO) Take 1 capsule by mouth daily.     psyllium (REGULOID) 0.52 g capsule Take 1.04 g by  mouth 2 (two) times daily.     TART CHERRY PO Take 480 mg by mouth 2 (two) times daily.     Current Facility-Administered Medications on File Prior to Visit  Medication Dose Route Frequency Provider Last Rate Last Admin   sodium chloride flush (NS) 0.9 % injection 3 mL  3 mL Intravenous Q12H [place], Cobi Delph M, MD  Allergies  Allergen Reactions   Statins Other (See Comments)    Joint pain and swelling/burning   Alirocumab Other (See Comments)    (praluent) all sorts of side effects. strange.  she had burning, weakness, felt joints hurt more, felt asthma flared.   Aricept [Donepezil]     leg pains   Atorvastatin     Leg pain   Crestor [Rosuvastatin]     Other reaction(s): even 1/2 of 5 mg 2-3 days a week couldn't take due to muscle side effects.   Diazepam     "makes me crazy"   Dry Skin Treatment [Albolene]     Other reaction(s): contact dermatitis when in covid hospital   Evolocumab     (Repatha) felt like going to pass out.legs were weak.   Ezetimibe     leg cramps   Fenofibrate Micronized     cramps   Fentanyl Other (See Comments)    loopy   Hydrocodone     Other reaction(s): Not available   Loratadine     leg cramps   Metformin     upset stomach   Morphine And Related Other (See Comments)    hallucinations   Niaspan [Niacin]     burning   Phentermine-Topiramate     Other reaction(s): just didn't work for her.   Welchol [Colesevelam]     Leg cramps   Zocor [Simvastatin]     muscle aches   Azithromycin Rash    Legs burning     Past Medical History:  Diagnosis Date   Actinic keratosis    Anxiety    Asthma    Albuterol inhaler as needed.Pulmicort neb as needed   Breast cancer (HCC)    right - lumpectomy    Chronic back pain    spinal stenosis   Coronary artery disease    COVID    uses o2 since at hs   Dependence on nocturnal oxygen therapy 07/10/2017   Pt uses 2 liters 02 at night    Depression    takes CYmbalta and Wellbutrin daily   Diabetes  mellitus    not on any meds/controlled by diet   Dyspnea    with exertion occasionally when lies down   GERD (gastroesophageal reflux disease)    takes Omeprazole daily   Headache    oocasionally   History of kidney stones    Hyperlipidemia    not on any meds   Hypertension    takes Benazepril,Bystolic,and Amlodipine daily   IBS (irritable bowel syndrome)    Joint pain    Joint swelling    Livedoid vasculitis    Nocturia    OA (osteoarthritis) of knee    Other seborrheic keratosis    Oxygen deficiency    2 liters at night per pt for OSA- no cpap    Peripheral edema    takes daily as needed   Peripheral neuropathy    Personal history of chemotherapy    Personal history of radiation therapy    Pneumonia    hx of-several yrs ago   RA (rheumatoid arthritis) (HCC)    Urinary frequency    Urinary urgency    Weakness    numbness and tingling mainly in left leg occasionally in right.Tingling/numbness in hands    Past Surgical History:  Procedure Laterality Date   BIOPSY  06/24/2021   Procedure: BIOPSY;  Surgeon: Meryl Dare, MD;  Location: [facility];  Service: Endoscopy;;   BREAST LUMPECTOMY  1997  RIGHT BREAST   CARDIAC CATHETERIZATION  04/23/2004   EF 60%   cataract surgery Bilateral    COLONOSCOPY     COLONOSCOPY WITH PROPOFOL N/A 08/07/2017   Procedure: COLONOSCOPY WITH PROPOFOL;  Surgeon: Meryl Dare, MD;  Location: [facility];  Service: Endoscopy;  Laterality: N/A;   COLONOSCOPY WITH PROPOFOL N/A 06/24/2021   Procedure: COLONOSCOPY WITH PROPOFOL;  Surgeon: Meryl Dare, MD;  Location: [facility];  Service: Endoscopy;  Laterality: N/A;   CORONARY ARTERY BYPASS GRAFT  2005   LIMA GRAFT TO LAD, SAPHENOUS VEIN GRAFT TO THE FIRST DIAGONAL, AND LEFT RADIAL ARTERY GRAFT TO THE OM   ESOPHAGOGASTRODUODENOSCOPY (EGD) WITH PROPOFOL N/A 08/07/2017   Procedure: ESOPHAGOGASTRODUODENOSCOPY (EGD) WITH PROPOFOL;  Surgeon: Meryl Dare, MD;  Location: [facility];  Service: Endoscopy;  Laterality: N/A;   FOOT SURGERY Right    JOINT REPLACEMENT     LITHOTRIPSY     LUMBAR LAMINECTOMY/DECOMPRESSION MICRODISCECTOMY N/A 09/08/2015   Procedure: CENTRAL DECOMPRESSIVE LUMBAR LAMINECTOMIES L3-4, L4-5 AND BILATERAL HEMILAMINECTOMY L5-S1;  Surgeon: Kerrin Champagne, MD;  Location: [facility] OR;  Service: Orthopedics;  Laterality: N/A;   nodule removed from left elbow     POLYPECTOMY  06/24/2021   Procedure: POLYPECTOMY;  Surgeon: Meryl Dare, MD;  Location: [facility];  Service: Endoscopy;;   RIGHT/LEFT HEART CATH AND CORONARY/GRAFT ANGIOGRAPHY N/A 10/06/2020   Procedure: RIGHT/LEFT HEART CATH AND CORONARY/GRAFT ANGIOGRAPHY;  Surgeon: [place], Bartosz Luginbill M, MD;  Location: [facility] INVASIVE CV LAB;  Service: Cardiovascular;  Laterality: N/A;   SHOULDER ARTHROSCOPY     TOTAL KNEE ARTHROPLASTY Bilateral    TOTAL SHOULDER ARTHROPLASTY     TRIPLE BYPASS  04/27/05   US ECHOCARDIOGRAPHY  01/06/2009   EF 55-60%   WRIST SURGERY Left     Social History   Tobacco Use  Smoking Status Never   Passive exposure: Never  Smokeless Tobacco Never    Social History   Substance and Sexual Activity  Alcohol Use No   Alcohol/week: 0.0 standard drinks of alcohol    Family History  Problem Relation Age of Onset   Heart disease Mother    Hyperlipidemia Mother    Lung cancer Father    Diabetes Father    Hyperlipidemia Father    Heart disease Sister    Hypertension Sister    Hyperlipidemia Sister    Heart disease Brother    Diabetes Brother    Hypertension Brother    Hyperlipidemia Brother    Lung cancer Brother    Heart disease Brother    Hypertension Brother    Hyperlipidemia Brother    Heart disease Brother    Hypertension Brother    Hyperlipidemia Brother    Heart disease Maternal Aunt    Asthma Maternal Uncle    Heart disease Maternal Uncle     Review of Systems: As noted history of present illness.  All other systems were reviewed and are  negative.  Physical Exam: BP 128/84 (BP Location: Left Arm, Patient Position: Sitting, Cuff Size: Normal)   Pulse (!) 59   Ht 5\' 4"  (1.626 m)   BMI 40.27 kg/m  GENERAL:  Well appearing obese WF uses a cane to walk. HEENT:  PERRL, EOMI, sclera are clear. Oropharynx is clear. NECK:  No jugular venous distention, carotid upstroke brisk and symmetric, no bruits, no thyromegaly or adenopathy LUNGS:  Clear to auscultation bilaterally CHEST:  Unremarkable HEART:  RRR,  PMI not displaced or sustained,S1 and S2 within normal limits, no  S3, no S4: no clicks, no rubs, no murmurs ABD:  Soft, nontender. BS +, no masses or bruits. No hepatomegaly, no splenomegaly EXT:  2 + pulses throughout, chronic skin thickening with firmness. Early redness, no cyanosis no clubbing. Old surgical scar left wrist from radial harvest.  SKIN:  Warm and dry.  No rashes NEURO:  Alert and oriented x 3. Cranial nerves II through XII intact. PSYCH:  Cognitively intact    LABORATORY DATA:  Lab Results  Component Value Date   WBC 7.4 11/22/2022   HGB 13.0 11/22/2022   HCT 38.7 11/22/2022   PLT 174 11/22/2022   GLUCOSE 130 (H) 11/22/2022   CHOL 210 (H) 10/02/2020   TRIG 188 (H) 10/02/2020   HDL 52 10/02/2020   LDLCALC 125 (H) 10/02/2020   ALT 19 11/22/2022   AST 29 11/22/2022   NA 140 11/22/2022   K 4.5 11/22/2022   CL 100 11/22/2022   CREATININE 0.59 (L) 11/22/2022   BUN 18 11/22/2022   CO2 32 11/22/2022   TSH 2.93 07/28/2017   INR 1.0 10/02/2020   HGBA1C 8.1 (H) 02/26/2019   Labs dated 08/02/16: cholesterol 286, triglycerides 181, HDL 52, LDL 198, A1c 6.5%. Dated 08/25/17: cholesterol 257, triglycerides 121, HDL 59, LDL 174. A1c 7.6%. Normal chemistries, TSH, Hgb.  Dated 07/03/19: cholesterol 192, triglycerides 159, HDL 45, LDL 115. A1c 6.0%.  Dated 11/19/19: cholesterol 195, triglycerides 126, HDL 48, LDL 120.  Dated 08/18/20: A1c 7%.  Dated 12/08/20: cholesterol 139, triglycerides 127, HDL 42, LDL 72.   Dated 04/23/21: A1c 6.6% Dated 01/26/22: cholesterol 209, triglycerides 155, HDL 52, LDL 126. A1c 7%. TSH normal.   Ecg today shows NSR rate 59. Possible old septal infarct. I have personally reviewed and interpreted this study.    Echo: 2/16/15Study Conclusions  - Left ventricle: The cavity size was normal. Wall thickness   was normal. Systolic function was normal. The estimated   ejection fraction was in the range of 50% to 55%. - Mitral valve: Nodular calcification of anterior leaflet   tip Mild regurgitation. - Left atrium: The atrium was moderately dilated. - Atrial septum: No defect or patent foramen ovale was   identified. - Pericardium, extracardiac: A trivial pericardial effusion   was identified posterior to the heart. - Impressions: Elevated E/E' ratio suggested diastolic   dysfunction with elevated EDP Impressions:  - Elevated E/E' ratio suggested diastolic dysfunction with   elevated EDP   Myoview 09/29/16: Study Highlights     The left ventricular ejection fraction is mildly decreased (45-54%). Nuclear stress EF: 51%. There was no ST segment deviation noted during stress. The study is normal. This is a low risk study.   Normal stress nuclear study with no ischemia or infarction; EF 51 with normal wall motion; cannot R/O left breast mass; suggest mammogram if not performed recently.     Echo 09/20/17: Study Conclusions   - Left ventricle: Wall thickness was increased in a pattern of    moderate LVH. Systolic function was normal. The estimated    ejection fraction was in the range of 55% to 60%. Features are    consistent with a pseudonormal left ventricular filling pattern,    with concomitant abnormal relaxation and increased filling    pressure (grade 2 diastolic dysfunction).   Cardiac cath 10/06/20:  RIGHT/LEFT HEART CATH AND CORONARY/GRAFT ANGIOGRAPHY    Conclusion    Prox LAD to Mid LAD lesion is 100% stenosed. 1st Diag lesion is 80%  stenosed. 1st  Mrg lesion is 100% stenosed. 1st Sept lesion is 90% stenosed. RPDA lesion is 90% stenosed. 2nd RPL lesion is 70% stenosed. RPAV lesion is 50% stenosed. SVG graft was visualized by angiography. Origin to Prox Graft lesion is 100% stenosed. Left radial artery graft was visualized by angiography and is normal in caliber. LIMA graft was visualized by non-selective angiography and is normal in caliber. The left ventricular systolic function is normal. LV end diastolic pressure is normal. The left ventricular ejection fraction is 55-65% by visual estimate. Hemodynamic findings consistent with mild pulmonary hypertension.   1. 3 vessel obstructive CAD    - 100% mid LAD    - 80% first diagonal, 90% first septal perforator    - 100% OM1    - 90% mid PDA, 70% PLOM2 2. Patent LIMA to the LAD 3. Patent radial artery graft to the OM1 4. Occluded SVG to diagonal 5. Normal LV function 6. Normal LV filling pressures mean PCWP 12 mm Hg 7. Mild pulmonary HTN with PA mean pressure 25 mm Hg 8. Normal cardiac output.   Plan: recommend continued medical therapy. While the diagonal and PDA could be causing some ischemia these vessels are relatively small and not ideal for PCI. I suspect based on these findings that most of her symptoms are more related to pulmonary disease and deconditioning/obesity.    Coronary Diagrams   Diagnostic Dominance: Right    Intervention  Hemo Data  Flowsheet Row Most Recent Value  Fick Cardiac Output 5.89 L/min  Fick Cardiac Output Index 2.77 (L/min)/BSA  RA A Wave 9 mmHg  RA V Wave 6 mmHg  RA Mean 4 mmHg  RV Systolic Pressure 42 mmHg  RV Diastolic Pressure 1 mmHg  RV EDP 8 mmHg  PA Systolic Pressure 44 mmHg  PA Diastolic Pressure 9 mmHg  PA Mean 25 mmHg  PW A Wave 17 mmHg  PW V Wave 17 mmHg  PW Mean 12 mmHg  AO Systolic Pressure 163 mmHg  AO Diastolic Pressure 69 mmHg  AO Mean 103 mmHg  LV Systolic Pressure 187 mmHg  LV Diastolic  Pressure 13 mmHg  LV EDP 24 mmHg  AOp Systolic Pressure 174 mmHg  AOp Diastolic Pressure 75 mmHg  AOp Mean Pressure 114 mmHg  LVp Systolic Pressure 181 mmHg  LVp Diastolic Pressure 11 mmHg  LVp EDP Pressure 24 mmHg  QP/QS 1  TPVR Index 9.05 HRUI  TSVR Index 37.27 HRUI  PVR SVR Ratio 0.13  TPVR/TSVR Ratio 0.24      Assessment / Plan: 1. Coronary disease.  She is status post CABG in 2005. Nuclear stress test in March 2018 was normal.  She Is on optimal medical therapy.  Continue Bystolic and amlodipine. On ASA. Cardiac cath March 2022  showed occlusion of SVG to diagonal with patent LIMA to LAD and radial graft to OM. Mild pulmonary HTN with normal LV filling pressures. Symptoms are stable.   2. Hypertension, well controlled. Continue current medication  3. Hyperlipidemia.  Statin intolerant. Now on Nexletol every other day. Not interested in injectables.    4. Diabetes mellitus type 2.  5. Morbid obesity with OSA  6. Chronic lymphedema with cellulitis and ulcers

## 2022-11-24 NOTE — Patient Instructions (Signed)
Medication Instructions:  No Changes *If you need a refill on your cardiac medications before your next appointment, please call your pharmacy*   Lab Work: No Labs If you have labs (blood work) drawn today and your tests are completely normal, you will receive your results only by: MyChart Message (if you have MyChart) OR A paper copy in the mail If you have any lab test that is abnormal or we need to change your treatment, we will call you to review the results.   Testing/Procedures: No Testing   Follow-Up: At [facility], you and your health needs are our priority.  As part of our continuing mission to provide you with exceptional heart care, we have created designated Provider Care Teams.  These Care Teams include your primary Cardiologist (physician) and Advanced Practice Providers (APPs -  Physician Assistants and Nurse Practitioners) who all work together to provide you with the care you need, when you need it.  We recommend signing up for the patient portal called "MyChart".  Sign up information is provided on this After Visit Summary.  MyChart is used to connect with patients for Virtual Visits (Telemedicine).  Patients are able to view lab/test results, encounter notes, upcoming appointments, etc.  Non-urgent messages can be sent to your provider as well.   To learn more about what you can do with MyChart, go to [email or web address].    Your next appointment:   1 year(s)  Provider:   Peter Jordan, MD   

## 2022-12-07 DIAGNOSIS — Z124 Encounter for screening for malignant neoplasm of cervix: Secondary | ICD-10-CM | POA: Diagnosis not present

## 2022-12-07 DIAGNOSIS — Z6841 Body Mass Index (BMI) 40.0 and over, adult: Secondary | ICD-10-CM | POA: Diagnosis not present

## 2022-12-13 DIAGNOSIS — E1151 Type 2 diabetes mellitus with diabetic peripheral angiopathy without gangrene: Secondary | ICD-10-CM | POA: Diagnosis not present

## 2022-12-13 DIAGNOSIS — D8989 Other specified disorders involving the immune mechanism, not elsewhere classified: Secondary | ICD-10-CM | POA: Diagnosis not present

## 2022-12-13 DIAGNOSIS — I10 Essential (primary) hypertension: Secondary | ICD-10-CM | POA: Diagnosis not present

## 2022-12-13 DIAGNOSIS — M069 Rheumatoid arthritis, unspecified: Secondary | ICD-10-CM | POA: Diagnosis not present

## 2022-12-13 DIAGNOSIS — M1990 Unspecified osteoarthritis, unspecified site: Secondary | ICD-10-CM | POA: Diagnosis not present

## 2022-12-13 DIAGNOSIS — E785 Hyperlipidemia, unspecified: Secondary | ICD-10-CM | POA: Diagnosis not present

## 2022-12-13 DIAGNOSIS — G629 Polyneuropathy, unspecified: Secondary | ICD-10-CM | POA: Diagnosis not present

## 2022-12-13 DIAGNOSIS — I2510 Atherosclerotic heart disease of native coronary artery without angina pectoris: Secondary | ICD-10-CM | POA: Diagnosis not present

## 2022-12-13 DIAGNOSIS — I87333 Chronic venous hypertension (idiopathic) with ulcer and inflammation of bilateral lower extremity: Secondary | ICD-10-CM | POA: Diagnosis not present

## 2022-12-31 ENCOUNTER — Other Ambulatory Visit: Payer: Self-pay | Admitting: Rheumatology

## 2022-12-31 DIAGNOSIS — M0609 Rheumatoid arthritis without rheumatoid factor, multiple sites: Secondary | ICD-10-CM

## 2023-02-08 NOTE — Progress Notes (Signed)
Office Visit Note  Patient: [name]             Date of Birth: [birth year]           MRN: [id number]             PCP: Rodrigo Ran, MD Referring: Rodrigo Ran, MD Visit Date: 02/22/2023 Occupation: @GUAROCC @  Subjective:  Pain in left elbow nodule  History of Present Illness: [name] is a 77 y.o. female with seronegative rheumatoid arthritis, osteoarthritis and degenerative disc disease.  Patient states that she has been experiencing some discomfort in her left shoulder and left rheumatoid nodule on the right elbow.  She would like to have a cortisone injection.  She had rheumatoid nodule resected in the past.  She states that minocycline 100 mg p.o. twice daily has been very effective to prevent recurrence of lower extremity ulcers.  She has intermittent drainage but no infection.  She has been followed by Dr. Lajoyce Corners.  She continues to take hydroxychloroquine 200 mg p.o. twice daily without any interruption.  She denies any joint swelling.    Activities of Daily Living:  Patient reports morning stiffness for 2 hours.   Patient Reports nocturnal pain.  Difficulty dressing/grooming: Reports Difficulty climbing stairs: Reports Difficulty getting out of chair: Reports Difficulty using hands for taps, buttons, cutlery, and/or writing: Reports  Review of Systems  Constitutional:  Positive for fatigue.  HENT:  Positive for mouth dryness. Negative for mouth sores.   Eyes:  Negative for dryness.  Respiratory:  Positive for shortness of breath.   Cardiovascular:  Positive for chest pain and swelling in legs/feet. Negative for palpitations.  Gastrointestinal:  Positive for diarrhea. Negative for blood in stool and constipation.  Endocrine: Positive for increased urination.  Genitourinary:  Positive for involuntary urination.  Musculoskeletal:  Positive for joint pain, gait problem, joint pain, myalgias, muscle weakness, morning stiffness, muscle tenderness and myalgias. Negative  for joint swelling.  Skin:  Positive for redness, skin tightness and sensitivity to sunlight. Negative for color change, rash and hair loss.  Allergic/Immunologic: Negative for susceptible to infections.  Neurological:  Positive for dizziness, tremors and headaches.  Hematological:  Negative for swollen glands.  Psychiatric/Behavioral:  Negative for depressed mood and sleep disturbance. The patient is nervous/anxious.     PMFS History:  Patient Active Problem List   Diagnosis Date Noted   Lower limb ulcer, calf, left, limited to breakdown of skin (HCC) 04/01/2022   Lymphedema 04/01/2022   Trigger finger, left middle finger 10/26/2021   Trigger finger, right ring finger 10/26/2021   Trigger finger, right middle finger 10/26/2021   History of malignant neoplasm of breast 08/23/2021   OAB (overactive bladder) 07/29/2021   Benign neoplasm of ascending colon    Hx of adenomatous colonic polyps    Occult blood in stools    BMI 40.0-44.9, adult (HCC) 01/20/2021   Cervical prolapse 01/20/2021   Incontinence of feces with fecal urgency 01/20/2021   Irritable bowel syndrome with diarrhea 01/20/2021   Mixed incontinence urge and stress 01/20/2021   Unstable angina (HCC) 10/06/2020   Acute on chronic respiratory failure with hypoxia (HCC) 02/28/2019   OSA on CPAP 02/28/2019   Diabetes mellitus type 2, uncontrolled, with complications 02/28/2019   Breast cancer (HCC) 02/27/2019   History of cancer chemotherapy 02/27/2019   History of radiation therapy 02/27/2019   Diabetic neuropathy (HCC) 02/27/2019   HLD (hyperlipidemia) 02/27/2019   Essential hypertension 02/27/2019   QT prolongation 02/27/2019  Pneumonia due to COVID-19 virus 02/24/2019   Diarrhea    Benign neoplasm of cecum    Abdominal pain, epigastric    Gastroesophageal reflux disease    Dependence on nocturnal oxygen therapy 07/10/2017   Open wound of left lower leg 12/26/2016   Vitamin D deficiency 09/14/2016   History of  total knee replacement, bilateral 09/14/2016   DJD (degenerative joint disease), cervical 09/14/2016   Spondylosis of lumbar region without myelopathy or radiculopathy 09/14/2016   High risk medication use 05/20/2016   Spinal stenosis, lumbar region, with neurogenic claudication 09/08/2015    Class: Chronic   OSA (obstructive sleep apnea) 10/17/2013   Dyspnea 09/04/2013   Atypical chest pain 09/04/2013   Morbid obesity (HCC) 03/21/2012   Cancer of lower-outer quadrant of female breast (HCC) 03/15/2012   Diabetes mellitus, type II (HCC) 02/22/2011   Coronary artery disease    Hypertension    Hyperlipidemia    OA (osteoarthritis) of knee     Past Medical History:  Diagnosis Date   Actinic keratosis    Anxiety    Asthma    Albuterol inhaler as needed.Pulmicort neb as needed   Breast cancer (HCC)    right - lumpectomy    Chronic back pain    spinal stenosis   Coronary artery disease    COVID    uses o2 since at hs   Dependence on nocturnal oxygen therapy 07/10/2017   Pt uses 2 liters 02 at night    Depression    takes CYmbalta and Wellbutrin daily   Diabetes mellitus    not on any meds/controlled by diet   Dyspnea    with exertion occasionally when lies down   GERD (gastroesophageal reflux disease)    takes Omeprazole daily   Headache    oocasionally   History of kidney stones    Hyperlipidemia    not on any meds   Hypertension    takes Benazepril,Bystolic,and Amlodipine daily   IBS (irritable bowel syndrome)    Joint pain    Joint swelling    Livedoid vasculitis    Nocturia    OA (osteoarthritis) of knee    Other seborrheic keratosis    Oxygen deficiency    2 liters at night per pt for OSA- no cpap    Peripheral edema    takes daily as needed   Peripheral neuropathy    Personal history of chemotherapy    Personal history of radiation therapy    Pneumonia    hx of-several yrs ago   RA (rheumatoid arthritis) (HCC)    Urinary frequency    Urinary urgency     Weakness    numbness and tingling mainly in left leg occasionally in right.Tingling/numbness in hands    Family History  Problem Relation Age of Onset   Heart disease Mother    Hyperlipidemia Mother    Lung cancer Father    Diabetes Father    Hyperlipidemia Father    Heart disease Sister    Hypertension Sister    Hyperlipidemia Sister    Heart disease Brother    Diabetes Brother    Hypertension Brother    Hyperlipidemia Brother    Lung cancer Brother    Heart disease Brother    Hypertension Brother    Hyperlipidemia Brother    Heart disease Brother    Hypertension Brother    Hyperlipidemia Brother    Heart disease Maternal Aunt    Asthma Maternal Uncle    Heart disease  Maternal Uncle    Past Surgical History:  Procedure Laterality Date   BIOPSY  06/24/2021   Procedure: BIOPSY;  Surgeon: Meryl Dare, MD;  Location: [facility];  Service: Endoscopy;;   BREAST LUMPECTOMY  1997   RIGHT BREAST   CARDIAC CATHETERIZATION  04/23/2004   EF 60%   cataract surgery Bilateral    COLONOSCOPY     COLONOSCOPY WITH PROPOFOL N/A 08/07/2017   Procedure: COLONOSCOPY WITH PROPOFOL;  Surgeon: Meryl Dare, MD;  Location: [facility];  Service: Endoscopy;  Laterality: N/A;   COLONOSCOPY WITH PROPOFOL N/A 06/24/2021   Procedure: COLONOSCOPY WITH PROPOFOL;  Surgeon: Meryl Dare, MD;  Location: [facility];  Service: Endoscopy;  Laterality: N/A;   CORONARY ARTERY BYPASS GRAFT  2005   LIMA GRAFT TO LAD, SAPHENOUS VEIN GRAFT TO THE FIRST DIAGONAL, AND LEFT RADIAL ARTERY GRAFT TO THE OM   ESOPHAGOGASTRODUODENOSCOPY (EGD) WITH PROPOFOL N/A 08/07/2017   Procedure: ESOPHAGOGASTRODUODENOSCOPY (EGD) WITH PROPOFOL;  Surgeon: Meryl Dare, MD;  Location: [facility];  Service: Endoscopy;  Laterality: N/A;   FOOT SURGERY Right    JOINT REPLACEMENT     LITHOTRIPSY     LUMBAR LAMINECTOMY/DECOMPRESSION MICRODISCECTOMY N/A 09/08/2015   Procedure: CENTRAL DECOMPRESSIVE LUMBAR LAMINECTOMIES  L3-4, L4-5 AND BILATERAL HEMILAMINECTOMY L5-S1;  Surgeon: Kerrin Champagne, MD;  Location: [facility];  Service: Orthopedics;  Laterality: N/A;   nodule removed from left elbow     POLYPECTOMY  06/24/2021   Procedure: POLYPECTOMY;  Surgeon: Meryl Dare, MD;  Location: [facility];  Service: Endoscopy;;   RIGHT/LEFT HEART CATH AND CORONARY/GRAFT ANGIOGRAPHY N/A 10/06/2020   Procedure: RIGHT/LEFT HEART CATH AND CORONARY/GRAFT ANGIOGRAPHY;  Surgeon: [place]az[place], Peter M, MD;  Location: [facility];  Service: Cardiovascular;  Laterality: N/A;   SHOULDER ARTHROSCOPY     TOTAL KNEE ARTHROPLASTY Bilateral    TOTAL SHOULDER ARTHROPLASTY     TRIPLE BYPASS  04/27/05   US ECHOCARDIOGRAPHY  01/06/2009   EF 55-60%   WRIST SURGERY Left    Social History   Social History Narrative   Not on file   Immunization History  Administered Date(s) Administered   Influenza Split 04/24/2013   PFIZER(Purple Top)SARS-COV-2 Vaccination 09/01/2019, 09/25/2019, 04/04/2020   Pneumococcal Conjugate-13 08/26/2016   Zoster Recombinant(Shingrix) 10/11/2017, 10/11/2017, 12/22/2017, 12/22/2017     Objective: Vital Signs: BP 123/75 (BP Location: Left Arm, Patient Position: Sitting, Cuff Size: Normal)   Pulse 64   Resp 18   Ht 5\' 4"  (1.626 m)   Wt 237 lb (107.5 kg)   BMI 40.68 kg/m    Physical Exam Vitals and nursing note reviewed.  Constitutional:      Appearance: She is well-developed.  HENT:     Head: Normocephalic and atraumatic.  Eyes:     Conjunctiva/sclera: Conjunctivae normal.  Cardiovascular:     Rate and Rhythm: Normal rate and regular rhythm.     Heart sounds: Normal heart sounds.  Pulmonary:     Effort: Pulmonary effort is normal.     Breath sounds: Normal breath sounds.  Abdominal:     General: Bowel sounds are normal.     Palpations: Abdomen is soft.  Musculoskeletal:     Cervical back: Normal range of motion.     Right lower leg: Edema present.     Left lower leg: Edema present.   Lymphadenopathy:     Cervical: No cervical adenopathy.  Skin:    General: Skin is warm and dry.     Capillary  Refill: Capillary refill takes less than 2 seconds.  Neurological:     Mental Status: She is alert and oriented to person, place, and time.  Psychiatric:        Behavior: Behavior normal.      Musculoskeletal Exam: She had limited lateral rotation of the cervical spine.  Thoracic kyphosis was noted.  There was no tenderness over thoracic or lumbar spine.  Right shoulder joint abduction was limited to about 70 degrees.  Left shoulder joint was in full range of motion.  Elbow joints, wrist joints, MCPs PIPs and DIPs in good range of motion.  She had bilateral PIP and DIP thickening with no synovitis.  She had rheumatoid nodule over the left forearm.  Hip joints were difficult to assess in the sitting position.  Knee joints were in good range of motion without any warmth swelling or effusion.  There was no tenderness over ankles or MTPs.  CDAI Exam: CDAI Score: -- Patient Global: 20 / 100; Provider Global: 20 / 100 Swollen: --; Tender: -- Joint Exam 02/22/2023   No joint exam has been documented for this visit   There is currently no information documented on the homunculus. Go to the Rheumatology activity and complete the homunculus joint exam.  Investigation: No additional findings.  Imaging: No results found.  Recent Labs: Lab Results  Component Value Date   WBC 7.4 11/22/2022   HGB 13.0 11/22/2022   PLT 174 11/22/2022   NA 140 11/22/2022   K 4.5 11/22/2022   CL 100 11/22/2022   CO2 32 11/22/2022   GLUCOSE 130 (H) 11/22/2022   BUN 18 11/22/2022   CREATININE 0.59 (L) 11/22/2022   BILITOT 0.4 11/22/2022   ALKPHOS 54 10/02/2020   AST 29 11/22/2022   ALT 19 11/22/2022   PROT 7.1 11/22/2022   ALBUMIN 4.6 10/02/2020   CALCIUM 9.9 11/22/2022   GFRAA 104 03/02/2020    Speciality Comments: PLQ Eye Exam: 09/29/2022 WNL @ [facility]   Follow up in 9 months   November 18, 2020 high-resolution CT-according to Dr. Vassie Loll lung functions were preserved and no immunosuppressive therapy needed.  Procedures:  Medium Joint Inj: L elbow (Rheumatoid nodule) on 02/22/2023 4:07 PM Indications: pain Details: 25 G 1.5 in needle, dorsal approach Medications: 0.5 mL lidocaine 1 %; 20 mg triamcinolone acetonide 40 MG/ML Aspirate: 0 mL Outcome: tolerated well, no immediate complications Procedure, treatment alternatives, risks and benefits explained, specific risks discussed. Consent was given by the patient. Immediately prior to procedure a time out was called to verify the correct patient, procedure, equipment, support staff and site/side marked as required. Patient was prepped and draped in the usual sterile fashion.     Allergies: Statins, Alirocumab, Aricept [donepezil], Atorvastatin, Crestor [rosuvastatin], Diazepam, Dry skin treatment [albolene], Evolocumab, Ezetimibe, Fenofibrate micronized, Fentanyl, Hydrocodone, Loratadine, Metformin, Morphine and codeine, Niaspan [niacin], Phentermine-topiramate, Welchol [colesevelam], Zocor [simvastatin], and Azithromycin   Assessment / Plan:     Visit Diagnoses: Rheumatoid arthritis of multiple sites with negative rheumatoid factor (HCC) -patient continues to have some pain and stiffness in her joints.  She denies any history of joint swelling.  She had no synovitis on the examination.  She has been taking hydroxychloroquine 200 mg p.o. twice a day without any interruption.  Eye examination was on September 29, 2022.  I will refill hydroxychloroquine today.  Plan: hydroxychloroquine (PLAQUENIL) 200 MG tablet  High risk medication use - Plaquenil 200 mg 1 tablet by mouth twice daily.  (MTX was discontinued  in July 2019 due to lower extremity ulcers). PLQ Eye Exam: 09/29/2022 -Labs from November 22, 2022 were reviewed.  CBC and CMP were stable.  Will get labs today.  Plan: CBC with Differential/Platelet, COMPLETE METABOLIC PANEL WITH GFR.   Information for immunization was placed in the AVS.  Rheumatoid nodule of elbow, left (HCC)-patient states the nodule has been very painful and it is hard for her to rest.  She requested injection in the left rheumatoid nodule.  After informed consent was obtained left rheumatoid nodule was injected with lidocaine and Kenalog in the sterile fashion.  The procedure is described above.  Postprocedure instructions were given.  Pain in both hands-she can use to have some stiffness in her hands due to underlying osteoarthritis.  No synovitis was noted.  History of total knee replacement, bilateral-she has ongoing discomfort in her knee joints.  DDD (degenerative disc disease), cervical -she had limited range of motion with the stiffness.  [facility]  DDD (degenerative disc disease), lumbar -she has chronic lower back pain.  X-rays of lumbar spine on 08/19/19 consistent with multilevel spondylosis and DDD.  [facility].  Ulcer of left lower extremity, limited to breakdown of skin (HCC) - Non-healing x3 years+.  She had good response to minocycline 100 mg p.o. twice daily which she has been taking for the last 3 months.  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  Compression socks were advised.  Keeping lesions clean was also discussed.  She is followed by Dr. Lajoyce Corners.  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  Varicose veins of bilateral lower extremities with other complications - Followed by Dr. Randie Heinz (vascular surgery).  History of hypertension-blood pressure was normal at 123/75 today.  Other medical problems are listed as follows:  History of vitamin D deficiency  History of diabetes mellitus - followed by Dr. Waynard Edwards.  History of sleep apnea  History of breast cancer  Pedal edema  History of coronary artery disease  Ulcer of left lower extremity, limited to breakdown of skin (HCC) - Non-healing x3  years+.  She is followed by Dr. Ardelle Anton.  Orders: Orders Placed This Encounter  Procedures   Medium Joint Inj   CBC with Differential/Platelet   COMPLETE METABOLIC PANEL WITH GFR   Meds ordered this encounter  Medications   hydroxychloroquine (PLAQUENIL) 200 MG tablet    Sig: TAKE 1 TABLET TWICE DAILY FOR RHEUMATOID ARTHRITIS    Dispense:  180 tablet    Refill:  0     Follow-Up Instructions: Return in about 3 months (around 05/25/2023) for Rheumatoid arthritis, Osteoarthritis.   Pollyann Savoy, MD  Note - This record has been created using Animal nutritionist.  Chart creation errors have been sought, but may not always  have been located. Such creation errors do not reflect on  the standard of medical care.

## 2023-02-16 ENCOUNTER — Other Ambulatory Visit: Payer: Self-pay | Admitting: Rheumatology

## 2023-02-16 DIAGNOSIS — L97921 Non-pressure chronic ulcer of unspecified part of left lower leg limited to breakdown of skin: Secondary | ICD-10-CM

## 2023-02-16 NOTE — Telephone Encounter (Signed)
Last Fill: 11/22/2022  Labs: 11/22/2022 CBC and CMP are stable.  Glucose is elevated at 130.   Next Visit: 02/22/2023  Last Visit: 11/22/2022  DX: Ulcer of left lower extremity, limited to breakdown of skin   Current Dose per office note 11/22/2022: minocycline 100 mg p.o. twice daily   Okay to refill Minocycline?

## 2023-02-22 ENCOUNTER — Ambulatory Visit: Payer: Medicare HMO | Attending: Rheumatology | Admitting: Rheumatology

## 2023-02-22 ENCOUNTER — Encounter: Payer: Self-pay | Admitting: Rheumatology

## 2023-02-22 VITALS — BP 123/75 | HR 64 | Resp 18 | Ht 64.0 in | Wt 237.0 lb

## 2023-02-22 DIAGNOSIS — M5030 Other cervical disc degeneration, unspecified cervical region: Secondary | ICD-10-CM | POA: Diagnosis not present

## 2023-02-22 DIAGNOSIS — M79642 Pain in left hand: Secondary | ICD-10-CM

## 2023-02-22 DIAGNOSIS — Z96653 Presence of artificial knee joint, bilateral: Secondary | ICD-10-CM

## 2023-02-22 DIAGNOSIS — M5136 Other intervertebral disc degeneration, lumbar region: Secondary | ICD-10-CM | POA: Diagnosis not present

## 2023-02-22 DIAGNOSIS — Z79899 Other long term (current) drug therapy: Secondary | ICD-10-CM

## 2023-02-22 DIAGNOSIS — M79641 Pain in right hand: Secondary | ICD-10-CM

## 2023-02-22 DIAGNOSIS — Z8669 Personal history of other diseases of the nervous system and sense organs: Secondary | ICD-10-CM

## 2023-02-22 DIAGNOSIS — L97921 Non-pressure chronic ulcer of unspecified part of left lower leg limited to breakdown of skin: Secondary | ICD-10-CM | POA: Diagnosis not present

## 2023-02-22 DIAGNOSIS — Z853 Personal history of malignant neoplasm of breast: Secondary | ICD-10-CM

## 2023-02-22 DIAGNOSIS — M06322 Rheumatoid nodule, left elbow: Secondary | ICD-10-CM

## 2023-02-22 DIAGNOSIS — M0609 Rheumatoid arthritis without rheumatoid factor, multiple sites: Secondary | ICD-10-CM | POA: Diagnosis not present

## 2023-02-22 DIAGNOSIS — I83893 Varicose veins of bilateral lower extremities with other complications: Secondary | ICD-10-CM

## 2023-02-22 DIAGNOSIS — M51369 Other intervertebral disc degeneration, lumbar region without mention of lumbar back pain or lower extremity pain: Secondary | ICD-10-CM

## 2023-02-22 DIAGNOSIS — Z8679 Personal history of other diseases of the circulatory system: Secondary | ICD-10-CM

## 2023-02-22 DIAGNOSIS — Z8639 Personal history of other endocrine, nutritional and metabolic disease: Secondary | ICD-10-CM

## 2023-02-22 DIAGNOSIS — R600 Localized edema: Secondary | ICD-10-CM

## 2023-02-22 LAB — CBC WITH DIFFERENTIAL/PLATELET
Absolute Monocytes: 679 cells/uL (ref 200–950)
Basophils Absolute: 28 cells/uL (ref 0–200)
Basophils Relative: 0.4 %
Eosinophils Absolute: 70 cells/uL (ref 15–500)
Eosinophils Relative: 1 %
HCT: 39.5 % (ref 35.0–45.0)
Hemoglobin: 13.1 g/dL (ref 11.7–15.5)
Lymphs Abs: 2989 cells/uL (ref 850–3900)
MCH: 30.8 pg (ref 27.0–33.0)
MCHC: 33.2 g/dL (ref 32.0–36.0)
MCV: 92.9 fL (ref 80.0–100.0)
MPV: 11.7 fL (ref 7.5–12.5)
Monocytes Relative: 9.7 %
Neutro Abs: 3234 cells/uL (ref 1500–7800)
Neutrophils Relative %: 46.2 %
Platelets: 170 10*3/uL (ref 140–400)
RBC: 4.25 10*6/uL (ref 3.80–5.10)
RDW: 13.1 % (ref 11.0–15.0)
Total Lymphocyte: 42.7 %
WBC: 7 10*3/uL (ref 3.8–10.8)

## 2023-02-22 MED ORDER — HYDROXYCHLOROQUINE SULFATE 200 MG PO TABS
ORAL_TABLET | ORAL | 0 refills | Status: DC
Start: 2023-02-22 — End: 2023-05-08

## 2023-02-22 MED ORDER — LIDOCAINE HCL 1 % IJ SOLN
0.5000 mL | INTRAMUSCULAR | Status: AC | PRN
Start: 2023-02-22 — End: 2023-02-22
  Administered 2023-02-22: .5 mL

## 2023-02-22 MED ORDER — TRIAMCINOLONE ACETONIDE 40 MG/ML IJ SUSP
20.0000 mg | INTRAMUSCULAR | Status: AC | PRN
Start: 2023-02-22 — End: 2023-02-22
  Administered 2023-02-22: 20 mg via INTRA_ARTICULAR

## 2023-02-22 NOTE — Addendum Note (Signed)
Addended by: Pollyann Savoy on: 02/22/2023 05:09 PM   Modules accepted: Level of Service

## 2023-02-22 NOTE — Patient Instructions (Signed)

## 2023-02-23 NOTE — Progress Notes (Signed)
CBC normal.  CMP shows elevated glucose.  Please notify patient and forward results to her PCP.

## 2023-02-28 ENCOUNTER — Other Ambulatory Visit: Payer: Self-pay | Admitting: Internal Medicine

## 2023-02-28 DIAGNOSIS — Z1231 Encounter for screening mammogram for malignant neoplasm of breast: Secondary | ICD-10-CM

## 2023-03-10 ENCOUNTER — Ambulatory Visit
Admission: RE | Admit: 2023-03-10 | Discharge: 2023-03-10 | Disposition: A | Discharge status: Home / Self Care | Payer: Medicare HMO | Source: Ambulatory Visit | Attending: Internal Medicine | Admitting: Internal Medicine

## 2023-03-10 DIAGNOSIS — Z1231 Encounter for screening mammogram for malignant neoplasm of breast: Secondary | ICD-10-CM

## 2023-03-15 ENCOUNTER — Other Ambulatory Visit: Payer: Self-pay | Admitting: Internal Medicine

## 2023-03-15 DIAGNOSIS — R928 Other abnormal and inconclusive findings on diagnostic imaging of breast: Secondary | ICD-10-CM

## 2023-03-23 ENCOUNTER — Ambulatory Visit
Admission: RE | Admit: 2023-03-23 | Discharge: 2023-03-23 | Disposition: A | Discharge status: Home / Self Care | Payer: Medicare HMO | Source: Ambulatory Visit | Attending: Internal Medicine | Admitting: Internal Medicine

## 2023-03-23 DIAGNOSIS — R928 Other abnormal and inconclusive findings on diagnostic imaging of breast: Secondary | ICD-10-CM

## 2023-03-28 DIAGNOSIS — E1151 Type 2 diabetes mellitus with diabetic peripheral angiopathy without gangrene: Secondary | ICD-10-CM | POA: Diagnosis not present

## 2023-03-28 DIAGNOSIS — Z1212 Encounter for screening for malignant neoplasm of rectum: Secondary | ICD-10-CM | POA: Diagnosis not present

## 2023-03-28 DIAGNOSIS — I10 Essential (primary) hypertension: Secondary | ICD-10-CM | POA: Diagnosis not present

## 2023-03-28 DIAGNOSIS — E041 Nontoxic single thyroid nodule: Secondary | ICD-10-CM | POA: Diagnosis not present

## 2023-03-28 DIAGNOSIS — E785 Hyperlipidemia, unspecified: Secondary | ICD-10-CM | POA: Diagnosis not present

## 2023-04-03 DIAGNOSIS — Z0000 Encounter for general adult medical examination without abnormal findings: Secondary | ICD-10-CM | POA: Diagnosis not present

## 2023-04-03 DIAGNOSIS — R946 Abnormal results of thyroid function studies: Secondary | ICD-10-CM | POA: Diagnosis not present

## 2023-04-03 DIAGNOSIS — Z23 Encounter for immunization: Secondary | ICD-10-CM | POA: Diagnosis not present

## 2023-04-03 DIAGNOSIS — M069 Rheumatoid arthritis, unspecified: Secondary | ICD-10-CM | POA: Diagnosis not present

## 2023-04-03 DIAGNOSIS — J441 Chronic obstructive pulmonary disease with (acute) exacerbation: Secondary | ICD-10-CM | POA: Diagnosis not present

## 2023-04-03 DIAGNOSIS — E1151 Type 2 diabetes mellitus with diabetic peripheral angiopathy without gangrene: Secondary | ICD-10-CM | POA: Diagnosis not present

## 2023-04-03 DIAGNOSIS — M5136 Other intervertebral disc degeneration, lumbar region: Secondary | ICD-10-CM | POA: Diagnosis not present

## 2023-04-03 DIAGNOSIS — D8989 Other specified disorders involving the immune mechanism, not elsewhere classified: Secondary | ICD-10-CM | POA: Diagnosis not present

## 2023-04-03 DIAGNOSIS — Z9981 Dependence on supplemental oxygen: Secondary | ICD-10-CM | POA: Diagnosis not present

## 2023-04-03 DIAGNOSIS — I10 Essential (primary) hypertension: Secondary | ICD-10-CM | POA: Diagnosis not present

## 2023-04-03 DIAGNOSIS — R82998 Other abnormal findings in urine: Secondary | ICD-10-CM | POA: Diagnosis not present

## 2023-04-03 DIAGNOSIS — E785 Hyperlipidemia, unspecified: Secondary | ICD-10-CM | POA: Diagnosis not present

## 2023-04-12 DIAGNOSIS — H903 Sensorineural hearing loss, bilateral: Secondary | ICD-10-CM | POA: Diagnosis not present

## 2023-05-07 ENCOUNTER — Other Ambulatory Visit: Payer: Self-pay | Admitting: Rheumatology

## 2023-05-07 DIAGNOSIS — M0609 Rheumatoid arthritis without rheumatoid factor, multiple sites: Secondary | ICD-10-CM

## 2023-05-08 NOTE — Telephone Encounter (Signed)
Last Fill: 02/22/2023  Eye exam: 09/29/2022 WNL    Labs: 02/22/2023 CBC normal.  CMP shows elevated glucose.   Next Visit: 05/25/2023  Last Visit: 02/22/2023  DX: Rheumatoid arthritis of multiple sites with negative rheumatoid factor   Current Dose per office note 02/22/2023: Plaquenil 200 mg 1 tablet by mouth twice daily.   Okay to refill Plaquenil?

## 2023-05-12 NOTE — Progress Notes (Signed)
Office Visit Note  Patient: [name]             Date of Birth: [birth year]           MRN: [id number]             PCP: Rodrigo Ran, MD Referring: Rodrigo Ran, MD Visit Date: 05/25/2023 Occupation: @GUAROCC @  Subjective:  Pain in joints  History of Present Illness: CYNIAH BILINSKI is a 77 y.o. female with seronegative rheumatoid arthritis, osteoarthritis and degenerative disc disease.  She states she has not had any recurrence of ulcers on her lower extremities since she has been taking minocycline 100 mg twice a day.  Continues to have some discomfort in the bilateral hands, bilateral knee joints, her neck and lower back.  She had left elbow rheumatoid nodule injections at the last visit which helped to some extent to reduce the nodules per patient.  Her bilateral total knee replacement distal hurts.    Activities of Daily Living:  Patient reports morning stiffness for 2 hours.   Patient Reports nocturnal pain.  Difficulty dressing/grooming: Reports Difficulty climbing stairs: Reports Difficulty getting out of chair: Reports Difficulty using hands for taps, buttons, cutlery, and/or writing: Reports  Review of Systems  Constitutional:  Positive for fatigue.  HENT:  Positive for mouth dryness. Negative for mouth sores.   Eyes:  Negative for dryness.  Respiratory:  Negative for wheezing and difficulty breathing.   Cardiovascular:  Negative for chest pain and palpitations.  Gastrointestinal:  Positive for diarrhea. Negative for blood in stool and constipation.  Endocrine: Negative for increased urination.  Genitourinary:  Positive for involuntary urination. Negative for painful urination.  Musculoskeletal:  Positive for joint pain, gait problem, joint pain, myalgias, muscle weakness, morning stiffness, muscle tenderness and myalgias. Negative for joint swelling.  Skin:  Negative for color change, rash, hair loss and sensitivity to sunlight.  Allergic/Immunologic: Positive for  susceptible to infections.  Neurological:  Positive for headaches. Negative for dizziness.  Hematological:  Negative for swollen glands.  Psychiatric/Behavioral:  Negative for depressed mood and sleep disturbance. The patient is not nervous/anxious.     PMFS History:  Patient Active Problem List   Diagnosis Date Noted   Lower limb ulcer, calf, left, limited to breakdown of skin (HCC) 04/01/2022   Lymphedema 04/01/2022   Trigger finger, left middle finger 10/26/2021   Trigger finger, right ring finger 10/26/2021   Trigger finger, right middle finger 10/26/2021   History of malignant neoplasm of breast 08/23/2021   OAB (overactive bladder) 07/29/2021   Benign neoplasm of ascending colon    Hx of adenomatous colonic polyps    Occult blood in stools    BMI 40.0-44.9, adult (HCC) 01/20/2021   Cervical prolapse 01/20/2021   Incontinence of feces with fecal urgency 01/20/2021   Irritable bowel syndrome with diarrhea 01/20/2021   Mixed incontinence urge and stress 01/20/2021   Unstable angina (HCC) 10/06/2020   Acute on chronic respiratory failure with hypoxia (HCC) 02/28/2019   OSA on CPAP 02/28/2019   Diabetes mellitus type 2, uncontrolled, with complications 02/28/2019   Breast cancer (HCC) 02/27/2019   History of cancer chemotherapy 02/27/2019   History of radiation therapy 02/27/2019   Diabetic neuropathy (HCC) 02/27/2019   HLD (hyperlipidemia) 02/27/2019   Essential hypertension 02/27/2019   QT prolongation 02/27/2019   Pneumonia due to COVID-19 virus 02/24/2019   Diarrhea    Benign neoplasm of cecum    Abdominal pain, epigastric    Gastroesophageal reflux  disease    Dependence on nocturnal oxygen therapy 07/10/2017   Open wound of left lower leg 12/26/2016   Vitamin D deficiency 09/14/2016   History of total knee replacement, bilateral 09/14/2016   DJD (degenerative joint disease), cervical 09/14/2016   Spondylosis of lumbar region without myelopathy or radiculopathy  09/14/2016   High risk medication use 05/20/2016   Spinal stenosis, lumbar region, with neurogenic claudication 09/08/2015    Class: Chronic   OSA (obstructive sleep apnea) 10/17/2013   Dyspnea 09/04/2013   Atypical chest pain 09/04/2013   Morbid obesity (HCC) 03/21/2012   Cancer of lower-outer quadrant of female breast (HCC) 03/15/2012   Diabetes mellitus, type II (HCC) 02/22/2011   Coronary artery disease    Hypertension    Hyperlipidemia    OA (osteoarthritis) of knee     Past Medical History:  Diagnosis Date   Actinic keratosis    Anxiety    Asthma    Albuterol inhaler as needed.Pulmicort neb as needed   Breast cancer (HCC)    right - lumpectomy    Chronic back pain    spinal stenosis   Coronary artery disease    COVID    uses o2 since at hs   Dependence on nocturnal oxygen therapy 07/10/2017   Pt uses 2 liters 02 at night    Depression    takes CYmbalta and Wellbutrin daily   Diabetes mellitus    not on any meds/controlled by diet   Dyspnea    with exertion occasionally when lies down   GERD (gastroesophageal reflux disease)    takes Omeprazole daily   Headache    oocasionally   History of kidney stones    Hyperlipidemia    not on any meds   Hypertension    takes Benazepril,Bystolic,and Amlodipine daily   IBS (irritable bowel syndrome)    Joint pain    Joint swelling    Livedoid vasculitis    Nocturia    OA (osteoarthritis) of knee    Other seborrheic keratosis    Oxygen deficiency    2 liters at night per pt for OSA- no cpap    Peripheral edema    takes daily as needed   Peripheral neuropathy    Personal history of chemotherapy    Personal history of radiation therapy    Pneumonia    hx of-several yrs ago   RA (rheumatoid arthritis) (HCC)    Urinary frequency    Urinary urgency    Weakness    numbness and tingling mainly in left leg occasionally in right.Tingling/numbness in hands    Family History  Problem Relation Age of Onset   Heart  disease Mother    Hyperlipidemia Mother    Lung cancer Father    Diabetes Father    Hyperlipidemia Father    Heart disease Sister    Hypertension Sister    Hyperlipidemia Sister    Heart disease Brother    Diabetes Brother    Hypertension Brother    Hyperlipidemia Brother    Lung cancer Brother    Heart disease Brother    Hypertension Brother    Hyperlipidemia Brother    Heart disease Brother    Hypertension Brother    Hyperlipidemia Brother    Heart disease Maternal Aunt    Asthma Maternal Uncle    Heart disease Maternal Uncle    Lupus Daughter    Breast cancer Neg Hx    Past Surgical History:  Procedure Laterality Date   BIOPSY  06/24/2021   Procedure: BIOPSY;  Surgeon: Meryl Dare, MD;  Location: [facility];  Service: Endoscopy;;   BREAST LUMPECTOMY  1997   RIGHT BREAST   CARDIAC CATHETERIZATION  04/23/2004   EF 60%   cataract surgery Bilateral    COLONOSCOPY     COLONOSCOPY WITH PROPOFOL N/A 08/07/2017   Procedure: COLONOSCOPY WITH PROPOFOL;  Surgeon: Meryl Dare, MD;  Location: [facility];  Service: Endoscopy;  Laterality: N/A;   COLONOSCOPY WITH PROPOFOL N/A 06/24/2021   Procedure: COLONOSCOPY WITH PROPOFOL;  Surgeon: Meryl Dare, MD;  Location: [facility];  Service: Endoscopy;  Laterality: N/A;   CORONARY ARTERY BYPASS GRAFT  2005   LIMA GRAFT TO LAD, SAPHENOUS VEIN GRAFT TO THE FIRST DIAGONAL, AND LEFT RADIAL ARTERY GRAFT TO THE OM   ESOPHAGOGASTRODUODENOSCOPY (EGD) WITH PROPOFOL N/A 08/07/2017   Procedure: ESOPHAGOGASTRODUODENOSCOPY (EGD) WITH PROPOFOL;  Surgeon: Meryl Dare, MD;  Location: [facility];  Service: Endoscopy;  Laterality: N/A;   FOOT SURGERY Right    JOINT REPLACEMENT     LITHOTRIPSY     LUMBAR LAMINECTOMY/DECOMPRESSION MICRODISCECTOMY N/A 09/08/2015   Procedure: CENTRAL DECOMPRESSIVE LUMBAR LAMINECTOMIES L3-4, L4-5 AND BILATERAL HEMILAMINECTOMY L5-S1;  Surgeon: Kerrin Champagne, MD;  Location: [facility];  Service: Orthopedics;   Laterality: N/A;   nodule removed from left elbow     POLYPECTOMY  06/24/2021   Procedure: POLYPECTOMY;  Surgeon: Meryl Dare, MD;  Location: [facility];  Service: Endoscopy;;   RIGHT/LEFT HEART CATH AND CORONARY/GRAFT ANGIOGRAPHY N/A 10/06/2020   Procedure: RIGHT/LEFT HEART CATH AND CORONARY/GRAFT ANGIOGRAPHY;  Surgeon: [place], Peter M, MD;  Location: [facility];  Service: Cardiovascular;  Laterality: N/A;   SHOULDER ARTHROSCOPY     TOTAL KNEE ARTHROPLASTY Bilateral    TOTAL SHOULDER ARTHROPLASTY     TRIPLE BYPASS  04/27/05   US ECHOCARDIOGRAPHY  01/06/2009   EF 55-60%   WRIST SURGERY Left    Social History   Social History Narrative   Not on file   Immunization History  Administered Date(s) Administered   Influenza Split 04/24/2013   PFIZER(Purple Top)SARS-COV-2 Vaccination 09/01/2019, 09/25/2019, 04/04/2020   Pneumococcal Conjugate-13 08/26/2016   Zoster Recombinant(Shingrix) 10/11/2017, 10/11/2017, 12/22/2017, 12/22/2017     Objective: Vital Signs: BP 134/81 (BP Location: Left Arm, Patient Position: Sitting, Cuff Size: Large)   Pulse (!) 57   Resp 14   Ht 5\' 4"  (1.626 m)   Wt 233 lb 9.6 oz (106 kg)   BMI 40.10 kg/m    Physical Exam Vitals and nursing note reviewed.  Constitutional:      Appearance: She is well-developed.  HENT:     Head: Normocephalic and atraumatic.  Eyes:     Conjunctiva/sclera: Conjunctivae normal.  Cardiovascular:     Rate and Rhythm: Normal rate and regular rhythm.     Heart sounds: Normal heart sounds.  Pulmonary:     Effort: Pulmonary effort is normal.     Breath sounds: Normal breath sounds.  Abdominal:     General: Bowel sounds are normal.     Palpations: Abdomen is soft.  Musculoskeletal:     Cervical back: Normal range of motion.  Lymphadenopathy:     Cervical: No cervical adenopathy.  Skin:    General: Skin is warm and dry.     Capillary Refill: Capillary refill takes less than 2 seconds.     Comments: Left elbow  rheumatoid nodules  Neurological:     Mental Status: She is alert and oriented to  person, place, and time.  Psychiatric:        Behavior: Behavior normal.      Musculoskeletal Exam: She has limited lateral rotation of the cervical spine.  Thoracic kyphosis noted.  No tenderness over thoracic or lumbar spine.  Right shoulder joint abduction is limited to 70 degrees.  Left shoulder joint with full range of motion.  Elbow joints, wrist joints, MCPs PIPs and DIPs were in good range of motion with no synovitis.  Bilateral PIP DIP and CMC thickening was noted.  Hip joints were difficult to assess in the seated position.  Bilateral knee joints were replaced without any warmth swelling or effusion.  There was no tenderness over ankles or MTPs.  CDAI Exam: CDAI Score: -- Patient Global: --; Provider Global: -- Swollen: --; Tender: -- Joint Exam 05/25/2023   No joint exam has been documented for this visit   There is currently no information documented on the homunculus. Go to the Rheumatology activity and complete the homunculus joint exam.  Investigation: No additional findings.  Imaging: No results found.  Recent Labs: Lab Results  Component Value Date   WBC 7.0 02/22/2023   HGB 13.1 02/22/2023   PLT 170 02/22/2023   NA 142 02/22/2023   K 3.9 02/22/2023   CL 102 02/22/2023   CO2 27 02/22/2023   GLUCOSE 159 (H) 02/22/2023   BUN 20 02/22/2023   CREATININE 0.73 02/22/2023   BILITOT 0.5 02/22/2023   ALKPHOS 54 10/02/2020   AST 30 02/22/2023   ALT 20 02/22/2023   PROT 6.7 02/22/2023   ALBUMIN 4.6 10/02/2020   CALCIUM 9.3 02/22/2023   GFRAA 104 03/02/2020    Speciality Comments: PLQ Eye Exam: 09/29/2022 WNL @ [facility]   Follow up in 9 months  November 18, 2020 high-resolution CT-according to Dr. Vassie Loll lung functions were preserved and no immunosuppressive therapy needed.  Procedures:  No procedures performed Allergies: Statins, Alirocumab, Aricept [donepezil],  Atorvastatin, Crestor [rosuvastatin], Diazepam, Dry skin treatment [albolene], Evolocumab, Ezetimibe, Fenofibrate micronized, Fentanyl, Hydrocodone, Loratadine, Metformin, Morphine and codeine, Niaspan [niacin], Phentermine-topiramate, Welchol [colesevelam], Zocor [simvastatin], and Azithromycin   Assessment / Plan:     Visit Diagnoses: Rheumatoid arthritis of multiple sites with negative rheumatoid factor (HCC)-patient had no synovitis on the examination.  She has been on Plaquenil 200 mg p.o. twice daily which she has been tolerating well.  She continues to have pain and stiffness in multiple joints.  High risk medication use - Plaquenil 200 mg 1 tablet by mouth twice daily.  (MTX was discontinued in July 2019 due to lower extremity ulcers). PLQ Eye Exam: 09/29/2022.Labs obtained on February 22, 2023 CBC and CMP were normal.  Rheumatoid nodule of elbow, left (HCC)-she had some reduction in the size of the nodules from the cortisone injection.  Pain in both hands-she continues to have some pain and stiffness in her bilateral hands.  No synovitis was noted.  History of total knee replacement, bilateral-patient had discomfort range of motion of bilateral knee joints.  DDD (degenerative disc disease), cervical - [facility]  Spondylosis of lumbar spine -she continues to have some discomfort in her lumbar spine.  X-rays of lumbar spine on 08/19/19 consistent with multilevel spondylosis and DDD.  [facility].  Ulcer of left lower extremity, limited to breakdown of skin (HCC) - Non-healing x3 years+.  She is followed by Dr. Ardelle Anton.  Patient has noticed improvement in her symptoms since she has been taking  minocycline.  She is on minocycline 100 mg p.o. twice daily  Other medical problems are listed as follows:  Calciphylaxis cutis  Varicose veins of bilateral lower extremities with other complications - Followed by Dr. Randie Heinz (vascular surgery).  History of vitamin D  deficiency  History of hypertension  History of diabetes mellitus - followed by Dr. Waynard Edwards.  History of sleep apnea  History of breast cancer  Pedal edema  History of coronary artery disease  Orders: Orders Placed This Encounter  Procedures   CBC with Differential/Platelet   COMPLETE METABOLIC PANEL WITH GFR   No orders of the defined types were placed in this encounter.    Follow-Up Instructions: Return in about 5 months (around 10/23/2023) for Rheumatoid arthritis.   Pollyann Savoy, MD  Note - This record has been created using Animal nutritionist.  Chart creation errors have been sought, but may not always  have been located. Such creation errors do not reflect on  the standard of medical care.

## 2023-05-21 ENCOUNTER — Other Ambulatory Visit: Payer: Self-pay | Admitting: Rheumatology

## 2023-05-21 DIAGNOSIS — L97921 Non-pressure chronic ulcer of unspecified part of left lower leg limited to breakdown of skin: Secondary | ICD-10-CM

## 2023-05-22 NOTE — Telephone Encounter (Signed)
Last Fill: 02/16/2023  Labs: 02/22/2023 CBC normal.  CMP shows elevated glucose.   Next Visit: 05/25/2023  Last Visit: 02/22/2023  DX: Ulcer of left lower extremity, limited to breakdown of skin   Current Dose per office note 02/22/2023: minocycline 100 mg p.o. twice daily   Okay to refill Minocycline?

## 2023-05-23 DIAGNOSIS — H903 Sensorineural hearing loss, bilateral: Secondary | ICD-10-CM | POA: Diagnosis not present

## 2023-05-25 ENCOUNTER — Encounter: Payer: Self-pay | Admitting: Rheumatology

## 2023-05-25 ENCOUNTER — Ambulatory Visit: Payer: Medicare HMO | Attending: Rheumatology | Admitting: Rheumatology

## 2023-05-25 VITALS — BP 134/81 | HR 57 | Resp 14 | Ht 64.0 in | Wt 233.6 lb

## 2023-05-25 DIAGNOSIS — Z8639 Personal history of other endocrine, nutritional and metabolic disease: Secondary | ICD-10-CM

## 2023-05-25 DIAGNOSIS — Z8679 Personal history of other diseases of the circulatory system: Secondary | ICD-10-CM

## 2023-05-25 DIAGNOSIS — M79642 Pain in left hand: Secondary | ICD-10-CM

## 2023-05-25 DIAGNOSIS — M0609 Rheumatoid arthritis without rheumatoid factor, multiple sites: Secondary | ICD-10-CM | POA: Diagnosis not present

## 2023-05-25 DIAGNOSIS — M06322 Rheumatoid nodule, left elbow: Secondary | ICD-10-CM | POA: Diagnosis not present

## 2023-05-25 DIAGNOSIS — Z8669 Personal history of other diseases of the nervous system and sense organs: Secondary | ICD-10-CM

## 2023-05-25 DIAGNOSIS — M47816 Spondylosis without myelopathy or radiculopathy, lumbar region: Secondary | ICD-10-CM

## 2023-05-25 DIAGNOSIS — L97921 Non-pressure chronic ulcer of unspecified part of left lower leg limited to breakdown of skin: Secondary | ICD-10-CM | POA: Diagnosis not present

## 2023-05-25 DIAGNOSIS — M5030 Other cervical disc degeneration, unspecified cervical region: Secondary | ICD-10-CM

## 2023-05-25 DIAGNOSIS — Z96653 Presence of artificial knee joint, bilateral: Secondary | ICD-10-CM

## 2023-05-25 DIAGNOSIS — M79641 Pain in right hand: Secondary | ICD-10-CM

## 2023-05-25 DIAGNOSIS — Z79899 Other long term (current) drug therapy: Secondary | ICD-10-CM | POA: Diagnosis not present

## 2023-05-25 DIAGNOSIS — I83893 Varicose veins of bilateral lower extremities with other complications: Secondary | ICD-10-CM

## 2023-05-25 DIAGNOSIS — Z853 Personal history of malignant neoplasm of breast: Secondary | ICD-10-CM

## 2023-05-25 DIAGNOSIS — R600 Localized edema: Secondary | ICD-10-CM

## 2023-05-25 LAB — CBC WITH DIFFERENTIAL/PLATELET
Absolute Lymphocytes: 3288 {cells}/uL (ref 850–3900)
Absolute Monocytes: 824 {cells}/uL (ref 200–950)
Basophils Absolute: 23 {cells}/uL (ref 0–200)
Basophils Relative: 0.3 %
Eosinophils Absolute: 108 {cells}/uL (ref 15–500)
Eosinophils Relative: 1.4 %
HCT: 39.8 % (ref 35.0–45.0)
Hemoglobin: 13.1 g/dL (ref 11.7–15.5)
MCH: 31.1 pg (ref 27.0–33.0)
MCHC: 32.9 g/dL (ref 32.0–36.0)
MCV: 94.5 fL (ref 80.0–100.0)
MPV: 12.1 fL (ref 7.5–12.5)
Monocytes Relative: 10.7 %
Neutro Abs: 3457 {cells}/uL (ref 1500–7800)
Neutrophils Relative %: 44.9 %
Platelets: 179 10*3/uL (ref 140–400)
RBC: 4.21 10*6/uL (ref 3.80–5.10)
RDW: 12.9 % (ref 11.0–15.0)
Total Lymphocyte: 42.7 %
WBC: 7.7 10*3/uL (ref 3.8–10.8)

## 2023-05-25 LAB — COMPLETE METABOLIC PANEL WITH GFR
AG Ratio: 2 (calc) (ref 1.0–2.5)
ALT: 21 U/L (ref 6–29)
AST: 28 U/L (ref 10–35)
Albumin: 4.4 g/dL (ref 3.6–5.1)
Alkaline phosphatase (APISO): 54 U/L (ref 37–153)
BUN: 19 mg/dL (ref 7–25)
CO2: 28 mmol/L (ref 20–32)
Calcium: 9.7 mg/dL (ref 8.6–10.4)
Chloride: 100 mmol/L (ref 98–110)
Creat: 0.76 mg/dL (ref 0.60–1.00)
Globulin: 2.2 g/dL (ref 1.9–3.7)
Glucose, Bld: 139 mg/dL — ABNORMAL HIGH (ref 65–99)
Potassium: 4.3 mmol/L (ref 3.5–5.3)
Sodium: 139 mmol/L (ref 135–146)
Total Bilirubin: 0.5 mg/dL (ref 0.2–1.2)
Total Protein: 6.6 g/dL (ref 6.1–8.1)
eGFR: 80 mL/min/{1.73_m2} (ref >=60)

## 2023-05-25 NOTE — Patient Instructions (Signed)

## 2023-05-26 NOTE — Progress Notes (Signed)
Glucose is elevated, probably not a fasting sample. CBC is normal.

## 2023-06-08 ENCOUNTER — Other Ambulatory Visit: Payer: Self-pay | Admitting: Cardiology

## 2023-06-08 DIAGNOSIS — I25708 Atherosclerosis of coronary artery bypass graft(s), unspecified, with other forms of angina pectoris: Secondary | ICD-10-CM

## 2023-06-08 DIAGNOSIS — E7800 Pure hypercholesterolemia, unspecified: Secondary | ICD-10-CM

## 2023-07-06 DIAGNOSIS — H35372 Puckering of macula, left eye: Secondary | ICD-10-CM | POA: Diagnosis not present

## 2023-07-06 DIAGNOSIS — H04123 Dry eye syndrome of bilateral lacrimal glands: Secondary | ICD-10-CM | POA: Diagnosis not present

## 2023-07-06 DIAGNOSIS — H524 Presbyopia: Secondary | ICD-10-CM | POA: Diagnosis not present

## 2023-07-06 DIAGNOSIS — E119 Type 2 diabetes mellitus without complications: Secondary | ICD-10-CM | POA: Diagnosis not present

## 2023-07-06 DIAGNOSIS — Z79899 Other long term (current) drug therapy: Secondary | ICD-10-CM | POA: Diagnosis not present

## 2023-07-06 LAB — HM DIABETES EYE EXAM

## 2023-07-17 DIAGNOSIS — E041 Nontoxic single thyroid nodule: Secondary | ICD-10-CM | POA: Diagnosis not present

## 2023-07-22 ENCOUNTER — Other Ambulatory Visit: Payer: Self-pay | Admitting: Physician Assistant

## 2023-07-22 DIAGNOSIS — M0609 Rheumatoid arthritis without rheumatoid factor, multiple sites: Secondary | ICD-10-CM

## 2023-07-24 NOTE — Telephone Encounter (Signed)
Last Fill: 05/08/2023  Eye exam: 07/06/2023 WNL   Labs: 05/25/2023 Glucose is elevated, probably not a fasting sample.  CBC is normal.   Next Visit: 11/03/2023  Last Visit: 05/25/2023  RU:[id number] arthritis of multiple sites with negative rheumatoid factor   Current Dose per office note 05/25/2023: Plaquenil 200 mg 1 tablet by mouth twice daily.   Okay to refill Plaquenil?

## 2023-08-18 ENCOUNTER — Other Ambulatory Visit: Payer: Self-pay | Admitting: Rheumatology

## 2023-08-18 DIAGNOSIS — L97921 Non-pressure chronic ulcer of unspecified part of left lower leg limited to breakdown of skin: Secondary | ICD-10-CM

## 2023-08-18 NOTE — Telephone Encounter (Signed)
Last Fill: 05/22/2023  Labs: 05/25/2023 Glucose is elevated, probably not a fasting sample.  CBC is normal.   Next Visit: 11/03/2023  Last Visit: 05/25/2023  DX: Ulcer of left lower extremity, limited to breakdown of skin   Current Dose per office note 05/25/2023: minocycline 100 mg p.o. twice daily   Okay to refill Minocycline?

## 2023-10-02 ENCOUNTER — Other Ambulatory Visit (HOSPITAL_COMMUNITY): Payer: Self-pay

## 2023-10-02 MED ORDER — GABAPENTIN 300 MG PO CAPS
600.0000 mg | ORAL_CAPSULE | Freq: Three times a day (TID) | ORAL | 3 refills | Status: AC
  Filled 2023-10-02 – 2023-10-11 (×2): qty 540, 90d supply, fill #0
  Filled 2024-01-02: qty 540, 90d supply, fill #1
  Filled 2024-04-04: qty 540, 90d supply, fill #2
  Filled 2024-07-15: qty 540, 90d supply, fill #3

## 2023-10-02 MED ORDER — MUPIROCIN 2 % EX OINT
TOPICAL_OINTMENT | CUTANEOUS | 6 refills | Status: DC
  Filled 2023-10-02: qty 22, 30d supply, fill #0
  Filled 2023-10-11: qty 22, 10d supply, fill #0

## 2023-10-11 ENCOUNTER — Other Ambulatory Visit: Payer: Self-pay

## 2023-10-11 ENCOUNTER — Other Ambulatory Visit (HOSPITAL_COMMUNITY): Payer: Self-pay

## 2023-10-20 ENCOUNTER — Telehealth: Payer: Self-pay | Admitting: Pharmacy Technician

## 2023-10-20 NOTE — Progress Notes (Deleted)
 Office Visit Note  Patient: [name]             Date of Birth: [birth year]           MRN: [id number]             PCP: Rodrigo Ran, MD Referring: Rodrigo Ran, MD Visit Date: 11/03/2023 Occupation: @GUAROCC @  Subjective:  No chief complaint on file.   History of Present Illness: [name] is a 78 y.o. female ***     Activities of Daily Living:  Patient reports morning stiffness for *** {minute/hour:19697}.   Patient {ACTIONS;DENIES/REPORTS:21021675::"Denies"} nocturnal pain.  Difficulty dressing/grooming: {ACTIONS;DENIES/REPORTS:21021675::"Denies"} Difficulty climbing stairs: {ACTIONS;DENIES/REPORTS:21021675::"Denies"} Difficulty getting out of chair: {ACTIONS;DENIES/REPORTS:21021675::"Denies"} Difficulty using hands for taps, buttons, cutlery, and/or writing: {ACTIONS;DENIES/REPORTS:21021675::"Denies"}  No Rheumatology ROS completed.   PMFS History:  Patient Active Problem List   Diagnosis Date Noted   Lower limb ulcer, calf, left, limited to breakdown of skin (HCC) 04/01/2022   Lymphedema 04/01/2022   Trigger finger, left middle finger 10/26/2021   Trigger finger, right ring finger 10/26/2021   Trigger finger, right middle finger 10/26/2021   History of malignant neoplasm of breast 08/23/2021   OAB (overactive bladder) 07/29/2021   Benign neoplasm of ascending colon    Hx of adenomatous colonic polyps    Occult blood in stools    BMI 40.0-44.9, adult (HCC) 01/20/2021   Cervical prolapse 01/20/2021   Incontinence of feces with fecal urgency 01/20/2021   Irritable bowel syndrome with diarrhea 01/20/2021   Mixed incontinence urge and stress 01/20/2021   Unstable angina (HCC) 10/06/2020   Acute on chronic respiratory failure with hypoxia (HCC) 02/28/2019   OSA on CPAP 02/28/2019   Diabetes mellitus type 2, uncontrolled, with complications 02/28/2019   Breast cancer (HCC) 02/27/2019   History of cancer chemotherapy 02/27/2019   History of radiation therapy  02/27/2019   Diabetic neuropathy (HCC) 02/27/2019   HLD (hyperlipidemia) 02/27/2019   Essential hypertension 02/27/2019   QT prolongation 02/27/2019   Pneumonia due to COVID-19 virus 02/24/2019   Diarrhea    Benign neoplasm of cecum    Abdominal pain, epigastric    Gastroesophageal reflux disease    Dependence on nocturnal oxygen therapy 07/10/2017   Open wound of left lower leg 12/26/2016   Vitamin D deficiency 09/14/2016   History of total knee replacement, bilateral 09/14/2016   DJD (degenerative joint disease), cervical 09/14/2016   Spondylosis of lumbar region without myelopathy or radiculopathy 09/14/2016   High risk medication use 05/20/2016   Spinal stenosis, lumbar region, with neurogenic claudication 09/08/2015    Class: Chronic   OSA (obstructive sleep apnea) 10/17/2013   Dyspnea 09/04/2013   Atypical chest pain 09/04/2013   Morbid obesity (HCC) 03/21/2012   Cancer of lower-outer quadrant of female breast (HCC) 03/15/2012   Diabetes mellitus, type II (HCC) 02/22/2011   Coronary artery disease    Hypertension    Hyperlipidemia    OA (osteoarthritis) of knee     Past Medical History:  Diagnosis Date   Actinic keratosis    Anxiety    Asthma    Albuterol inhaler as needed.Pulmicort neb as needed   Breast cancer (HCC)    right - lumpectomy    Chronic back pain    spinal stenosis   Coronary artery disease    COVID    uses o2 since at hs   Dependence on nocturnal oxygen therapy 07/10/2017   Pt uses 2 liters 02 at night    Depression  takes CYmbalta and Wellbutrin daily   Diabetes mellitus    not on any meds/controlled by diet   Dyspnea    with exertion occasionally when lies down   GERD (gastroesophageal reflux disease)    takes Omeprazole daily   Headache    oocasionally   History of kidney stones    Hyperlipidemia    not on any meds   Hypertension    takes Benazepril,Bystolic,and Amlodipine daily   IBS (irritable bowel syndrome)    Joint pain     Joint swelling    Livedoid vasculitis    Nocturia    OA (osteoarthritis) of knee    Other seborrheic keratosis    Oxygen deficiency    2 liters at night per pt for OSA- no cpap    Peripheral edema    takes daily as needed   Peripheral neuropathy    Personal history of chemotherapy    Personal history of radiation therapy    Pneumonia    hx of-several yrs ago   RA (rheumatoid arthritis) (HCC)    Urinary frequency    Urinary urgency    Weakness    numbness and tingling mainly in left leg occasionally in right.Tingling/numbness in hands    Family History  Problem Relation Age of Onset   Heart disease Mother    Hyperlipidemia Mother    Lung cancer Father    Diabetes Father    Hyperlipidemia Father    Heart disease Sister    Hypertension Sister    Hyperlipidemia Sister    Heart disease Brother    Diabetes Brother    Hypertension Brother    Hyperlipidemia Brother    Lung cancer Brother    Heart disease Brother    Hypertension Brother    Hyperlipidemia Brother    Heart disease Brother    Hypertension Brother    Hyperlipidemia Brother    Heart disease Maternal Aunt    Asthma Maternal Uncle    Heart disease Maternal Uncle    Lupus Daughter    Breast cancer Neg Hx    Past Surgical History:  Procedure Laterality Date   BIOPSY  06/24/2021   Procedure: BIOPSY;  Surgeon: Meryl Dare, MD;  Location: [facility];  Service: Endoscopy;;   BREAST LUMPECTOMY  1997   RIGHT BREAST   CARDIAC CATHETERIZATION  04/23/2004   EF 60%   cataract surgery Bilateral    COLONOSCOPY     COLONOSCOPY WITH PROPOFOL N/A 08/07/2017   Procedure: COLONOSCOPY WITH PROPOFOL;  Surgeon: Meryl Dare, MD;  Location: [facility];  Service: Endoscopy;  Laterality: N/A;   COLONOSCOPY WITH PROPOFOL N/A 06/24/2021   Procedure: COLONOSCOPY WITH PROPOFOL;  Surgeon: Meryl Dare, MD;  Location: [facility];  Service: Endoscopy;  Laterality: N/A;   CORONARY ARTERY BYPASS GRAFT  2005   LIMA GRAFT TO  LAD, SAPHENOUS VEIN GRAFT TO THE FIRST DIAGONAL, AND LEFT RADIAL ARTERY GRAFT TO THE OM   ESOPHAGOGASTRODUODENOSCOPY (EGD) WITH PROPOFOL N/A 08/07/2017   Procedure: ESOPHAGOGASTRODUODENOSCOPY (EGD) WITH PROPOFOL;  Surgeon: Meryl Dare, MD;  Location: [facility];  Service: Endoscopy;  Laterality: N/A;   FOOT SURGERY Right    JOINT REPLACEMENT     LITHOTRIPSY     LUMBAR LAMINECTOMY/DECOMPRESSION MICRODISCECTOMY N/A 09/08/2015   Procedure: CENTRAL DECOMPRESSIVE LUMBAR LAMINECTOMIES L3-4, L4-5 AND BILATERAL HEMILAMINECTOMY L5-S1;  Surgeon: Kerrin Champagne, MD;  Location: [facility];  Service: Orthopedics;  Laterality: N/A;   nodule removed from left elbow  POLYPECTOMY  06/24/2021   Procedure: POLYPECTOMY;  Surgeon: Meryl Dare, MD;  Location: [facility];  Service: Endoscopy;;   RIGHT/LEFT HEART CATH AND CORONARY/GRAFT ANGIOGRAPHY N/A 10/06/2020   Procedure: RIGHT/LEFT HEART CATH AND CORONARY/GRAFT ANGIOGRAPHY;  Surgeon: [place], Peter M, MD;  Location: [facility] INVASIVE CV LAB;  Service: Cardiovascular;  Laterality: N/A;   SHOULDER ARTHROSCOPY     TOTAL KNEE ARTHROPLASTY Bilateral    TOTAL SHOULDER ARTHROPLASTY     TRIPLE BYPASS  04/27/05   US ECHOCARDIOGRAPHY  01/06/2009   EF 55-60%   WRIST SURGERY Left    Social History   Social History Narrative   Not on file   Immunization History  Administered Date(s) Administered   Influenza Split 04/24/2013   PFIZER(Purple Top)SARS-COV-2 Vaccination 09/01/2019, 09/25/2019, 04/04/2020   Pneumococcal Conjugate-13 08/26/2016   Zoster Recombinant(Shingrix) 10/11/2017, 10/11/2017, 12/22/2017, 12/22/2017     Objective: Vital Signs: There were no vitals taken for this visit.   Physical Exam   Musculoskeletal Exam: ***  CDAI Exam: CDAI Score: -- Patient Global: --; Provider Global: -- Swollen: --; Tender: -- Joint Exam 11/03/2023   No joint exam has been documented for this visit   There is currently no information documented on the  homunculus. Go to the Rheumatology activity and complete the homunculus joint exam.  Investigation: No additional findings.  Imaging: No results found.  Recent Labs: Lab Results  Component Value Date   WBC 7.7 05/25/2023   HGB 13.1 05/25/2023   PLT 179 05/25/2023   NA 139 05/25/2023   K 4.3 05/25/2023   CL 100 05/25/2023   CO2 28 05/25/2023   GLUCOSE 139 (H) 05/25/2023   BUN 19 05/25/2023   CREATININE 0.76 05/25/2023   BILITOT 0.5 05/25/2023   ALKPHOS 54 10/02/2020   AST 28 05/25/2023   ALT 21 05/25/2023   PROT 6.6 05/25/2023   ALBUMIN 4.6 10/02/2020   CALCIUM 9.7 05/25/2023   GFRAA 104 03/02/2020    Speciality Comments: PLQ Eye Exam: 07/06/2023 WNL @ [facility]   Follow up in 6 months  November 18, 2020 high-resolution CT-according to Dr. Vassie Loll lung functions were preserved and no immunosuppressive therapy needed.  Procedures:  No procedures performed Allergies: Statins, Alirocumab, Aricept [donepezil], Atorvastatin, Crestor [rosuvastatin], Diazepam, Dry skin treatment [albolene], Evolocumab, Ezetimibe, Fenofibrate micronized, Fentanyl, Hydrocodone, Loratadine, Metformin, Morphine and codeine, Niaspan [niacin], Phentermine-topiramate, Welchol [colesevelam], Zocor [simvastatin], and Azithromycin   Assessment / Plan:     Visit Diagnoses: No diagnosis found.  Orders: No orders of the defined types were placed in this encounter.  No orders of the defined types were placed in this encounter.   Face-to-face time spent with patient was *** minutes. Greater than 50% of time was spent in counseling and coordination of care.  Follow-Up Instructions: No follow-ups on file.   Ellen Henri, CMA  Note - This record has been created using Animal nutritionist.  Chart creation errors have been sought, but may not always  have been located. Such creation errors do not reflect on  the standard of medical care.

## 2023-10-20 NOTE — Telephone Encounter (Signed)
 Pharmacy Patient Advocate Encounter   Received notification from CoverMyMeds that prior authorization for nexletol is required/requested.   Insurance verification completed.   The patient is insured through [facility] ADVANTAGE/RX ADVANCE .   Per test claim: PA required; PA submitted to above mentioned insurance via CoverMyMeds Key/confirmation #/EOC [id number] Status is pending

## 2023-10-23 ENCOUNTER — Telehealth: Payer: Self-pay | Admitting: Pharmacy Technician

## 2023-10-23 ENCOUNTER — Other Ambulatory Visit (HOSPITAL_COMMUNITY): Payer: Self-pay

## 2023-10-23 DIAGNOSIS — E041 Nontoxic single thyroid nodule: Secondary | ICD-10-CM | POA: Diagnosis not present

## 2023-10-23 DIAGNOSIS — I87333 Chronic venous hypertension (idiopathic) with ulcer and inflammation of bilateral lower extremity: Secondary | ICD-10-CM | POA: Diagnosis not present

## 2023-10-23 DIAGNOSIS — E1151 Type 2 diabetes mellitus with diabetic peripheral angiopathy without gangrene: Secondary | ICD-10-CM | POA: Diagnosis not present

## 2023-10-23 DIAGNOSIS — M069 Rheumatoid arthritis, unspecified: Secondary | ICD-10-CM | POA: Diagnosis not present

## 2023-10-23 DIAGNOSIS — I10 Essential (primary) hypertension: Secondary | ICD-10-CM | POA: Diagnosis not present

## 2023-10-23 DIAGNOSIS — Z9981 Dependence on supplemental oxygen: Secondary | ICD-10-CM | POA: Diagnosis not present

## 2023-10-23 DIAGNOSIS — J441 Chronic obstructive pulmonary disease with (acute) exacerbation: Secondary | ICD-10-CM | POA: Diagnosis not present

## 2023-10-23 DIAGNOSIS — D8989 Other specified disorders involving the immune mechanism, not elsewhere classified: Secondary | ICD-10-CM | POA: Diagnosis not present

## 2023-10-23 DIAGNOSIS — G629 Polyneuropathy, unspecified: Secondary | ICD-10-CM | POA: Diagnosis not present

## 2023-10-23 DIAGNOSIS — E785 Hyperlipidemia, unspecified: Secondary | ICD-10-CM | POA: Diagnosis not present

## 2023-10-23 DIAGNOSIS — J9611 Chronic respiratory failure with hypoxia: Secondary | ICD-10-CM | POA: Diagnosis not present

## 2023-10-23 DIAGNOSIS — J45909 Unspecified asthma, uncomplicated: Secondary | ICD-10-CM | POA: Diagnosis not present

## 2023-10-23 DIAGNOSIS — R946 Abnormal results of thyroid function studies: Secondary | ICD-10-CM | POA: Diagnosis not present

## 2023-10-23 NOTE — Telephone Encounter (Signed)
 Patient Advocate Encounter   The patient was approved for a [employer] grant that will help cover the cost of nexletol Total amount awarded, 2500.  Effective: 12/14/2022 - 12/13/2023   BIN: [id number] PCN: [id number] Group: [id number] ID: [employer] ID: [id number]   Pharmacy provided with approval and processing information. Patient informed via telephone with daughter tammy     Pt is signed up for the grant but it was signed up to pay only for their premium not a copay assistance. They have to call [employer] and ask them to change this grant from a premium assistance to a copayment assistance. I spoke to [facility] and she will call and let me know

## 2023-10-23 NOTE — Telephone Encounter (Addendum)
 Pharmacy Patient Advocate Encounter  Received notification from [facility] ADVANTAGE/RX ADVANCE that Prior Authorization for nexletol has been APPROVED from 10/20/23 to 04/21/24. Spoke to pharmacy to process.Copay is $250.00 for 90 days.     PA #/Case ID/Reference #: [id number]

## 2023-10-24 NOTE — Telephone Encounter (Signed)
 I called and they said this was changed to copay assistance but the card information has not been populated yet and she said it should be by tomorrow. I will check tomorrow and then call the pharmacy with the information.

## 2023-10-25 NOTE — Telephone Encounter (Signed)
   Pharmacy has information and daughter is aware

## 2023-11-03 ENCOUNTER — Ambulatory Visit: Payer: Medicare HMO | Admitting: Rheumatology

## 2023-11-03 DIAGNOSIS — R600 Localized edema: Secondary | ICD-10-CM

## 2023-11-03 DIAGNOSIS — M47816 Spondylosis without myelopathy or radiculopathy, lumbar region: Secondary | ICD-10-CM

## 2023-11-03 DIAGNOSIS — Z79899 Other long term (current) drug therapy: Secondary | ICD-10-CM

## 2023-11-03 DIAGNOSIS — M5030 Other cervical disc degeneration, unspecified cervical region: Secondary | ICD-10-CM

## 2023-11-03 DIAGNOSIS — M79641 Pain in right hand: Secondary | ICD-10-CM

## 2023-11-03 DIAGNOSIS — Z8679 Personal history of other diseases of the circulatory system: Secondary | ICD-10-CM

## 2023-11-03 DIAGNOSIS — Z8639 Personal history of other endocrine, nutritional and metabolic disease: Secondary | ICD-10-CM

## 2023-11-03 DIAGNOSIS — I83893 Varicose veins of bilateral lower extremities with other complications: Secondary | ICD-10-CM

## 2023-11-03 DIAGNOSIS — M06322 Rheumatoid nodule, left elbow: Secondary | ICD-10-CM

## 2023-11-03 DIAGNOSIS — Z96653 Presence of artificial knee joint, bilateral: Secondary | ICD-10-CM

## 2023-11-03 DIAGNOSIS — Z8669 Personal history of other diseases of the nervous system and sense organs: Secondary | ICD-10-CM

## 2023-11-03 DIAGNOSIS — Z853 Personal history of malignant neoplasm of breast: Secondary | ICD-10-CM

## 2023-11-03 DIAGNOSIS — L97921 Non-pressure chronic ulcer of unspecified part of left lower leg limited to breakdown of skin: Secondary | ICD-10-CM

## 2023-11-03 DIAGNOSIS — M0609 Rheumatoid arthritis without rheumatoid factor, multiple sites: Secondary | ICD-10-CM

## 2023-11-12 ENCOUNTER — Other Ambulatory Visit: Payer: Self-pay | Admitting: Physician Assistant

## 2023-11-12 DIAGNOSIS — L97921 Non-pressure chronic ulcer of unspecified part of left lower leg limited to breakdown of skin: Secondary | ICD-10-CM

## 2023-11-24 ENCOUNTER — Other Ambulatory Visit: Payer: Self-pay | Admitting: Cardiology

## 2023-11-24 DIAGNOSIS — I25708 Atherosclerosis of coronary artery bypass graft(s), unspecified, with other forms of angina pectoris: Secondary | ICD-10-CM

## 2023-11-24 DIAGNOSIS — E7800 Pure hypercholesterolemia, unspecified: Secondary | ICD-10-CM

## 2023-11-27 ENCOUNTER — Other Ambulatory Visit: Payer: Self-pay | Admitting: *Deleted

## 2023-11-27 DIAGNOSIS — Z79899 Other long term (current) drug therapy: Secondary | ICD-10-CM

## 2023-11-27 DIAGNOSIS — Z13220 Encounter for screening for lipoid disorders: Secondary | ICD-10-CM

## 2023-11-27 DIAGNOSIS — L97921 Non-pressure chronic ulcer of unspecified part of left lower leg limited to breakdown of skin: Secondary | ICD-10-CM

## 2023-11-27 MED ORDER — MINOCYCLINE HCL 100 MG PO CAPS
100.0000 mg | ORAL_CAPSULE | Freq: Two times a day (BID) | ORAL | 2 refills | Status: DC
Start: 2023-11-27 — End: 2024-02-19

## 2023-11-27 NOTE — Telephone Encounter (Signed)
 Patient requested refill on Minocycline .  Last Fill: 08/18/2023  Labs: 05/25/2023 Glucose is elevated, probably not a fasting sample.  CBC is normal. (Updated labs on 11/27/2023, results pending)  Next Visit: 12/19/2023  Last Visit: 05/25/2023  DX: Ulcer of left lower extremity, limited to breakdown of skin   Current Dose per office note 05/25/2023: minocycline  100 mg p.o. twice daily   Okay to refill Minocycline ?

## 2023-11-28 LAB — CBC WITH DIFFERENTIAL/PLATELET
Absolute Lymphocytes: 2234 {cells}/uL (ref 850–3900)
Absolute Monocytes: 547 {cells}/uL (ref 200–950)
Basophils Absolute: 29 {cells}/uL (ref 0–200)
Basophils Relative: 0.5 %
Eosinophils Absolute: 80 {cells}/uL (ref 15–500)
Eosinophils Relative: 1.4 %
HCT: 40.6 % (ref 35.0–45.0)
Hemoglobin: 13.5 g/dL (ref 11.7–15.5)
MCH: 30.8 pg (ref 27.0–33.0)
MCHC: 33.3 g/dL (ref 32.0–36.0)
MCV: 92.7 fL (ref 80.0–100.0)
MPV: 12.2 fL (ref 7.5–12.5)
Monocytes Relative: 9.6 %
Neutro Abs: 2810 {cells}/uL (ref 1500–7800)
Neutrophils Relative %: 49.3 %
Platelets: 176 10*3/uL (ref 140–400)
RBC: 4.38 10*6/uL (ref 3.80–5.10)
RDW: 12.9 % (ref 11.0–15.0)
Total Lymphocyte: 39.2 %
WBC: 5.7 10*3/uL (ref 3.8–10.8)

## 2023-11-28 LAB — LIPID PANEL
Cholesterol: 167 mg/dL (ref <200)
HDL: 47 mg/dL — ABNORMAL LOW (ref >=50)
LDL Cholesterol (Calc): 89 mg/dL
Non-HDL Cholesterol (Calc): 120 mg/dL (ref <130)
Total CHOL/HDL Ratio: 3.6 (calc) (ref <5.0)
Triglycerides: 212 mg/dL — ABNORMAL HIGH (ref <150)

## 2023-11-28 LAB — COMPREHENSIVE METABOLIC PANEL WITH GFR
AG Ratio: 1.8 (calc) (ref 1.0–2.5)
ALT: 21 U/L (ref 6–29)
AST: 30 U/L (ref 10–35)
Albumin: 4.3 g/dL (ref 3.6–5.1)
Alkaline phosphatase (APISO): 54 U/L (ref 37–153)
BUN/Creatinine Ratio: 27 (calc) — ABNORMAL HIGH (ref 6–22)
BUN: 15 mg/dL (ref 7–25)
CO2: 28 mmol/L (ref 20–32)
Calcium: 9.4 mg/dL (ref 8.6–10.4)
Chloride: 102 mmol/L (ref 98–110)
Creat: 0.55 mg/dL — ABNORMAL LOW (ref 0.60–1.00)
Globulin: 2.4 g/dL (ref 1.9–3.7)
Glucose, Bld: 203 mg/dL — ABNORMAL HIGH (ref 65–99)
Potassium: 4.2 mmol/L (ref 3.5–5.3)
Sodium: 141 mmol/L (ref 135–146)
Total Bilirubin: 0.6 mg/dL (ref 0.2–1.2)
Total Protein: 6.7 g/dL (ref 6.1–8.1)
eGFR: 94 mL/min/{1.73_m2} (ref >=60)

## 2023-11-28 NOTE — Progress Notes (Signed)
 Glucose is elevated.  Triglycerides are elevated and HDL is low.  CBC normal

## 2023-12-05 NOTE — Progress Notes (Unsigned)
 Office Visit Note  Patient: [name]             Date of Birth: 06-21-1946           MRN: [id number]             PCP: Aldo Hun, MD Referring: Aldo Hun, MD Visit Date: 12/19/2023 Occupation: @GUAROCC @  Subjective:  Chronic pain  History of Present Illness: [name] is a 78 y.o. female with history of seronegative rheumatoid arthritis and osteoarthritis.  Patient remains on Plaquenil  200 mg 1 tablet by mouth twice daily and minocycline  100 mg 1 capsule twice daily.  She has been tolerating combination therapy without any side effects.  Patient continues to have chronic pain involving multiple joints.  Her symptoms have been most severe involving her neck, lower back, both hands, and both feet.  She experiences neuropathy involving both hands and both feet which contribute to her pain levels.  She has noticed increased deformity in her hands due to underlying osteoarthritis.  She denies any recent falls.  She is using a cane to assist with ambulation.   Activities of Daily Living:  Patient reports morning stiffness for 2 hours.   Patient Reports nocturnal pain.  Difficulty dressing/grooming: Denies Difficulty climbing stairs: Reports Difficulty getting out of chair: Reports Difficulty using hands for taps, buttons, cutlery, and/or writing: Reports  Review of Systems  Constitutional:  Positive for fatigue.  HENT:  Positive for mouth dryness and sore tongue. Negative for mouth sores.   Eyes:  Negative for dryness.  Respiratory:  Positive for shortness of breath.   Cardiovascular:  Negative for chest pain and palpitations.  Gastrointestinal:  Positive for diarrhea. Negative for blood in stool and constipation.  Endocrine: Positive for increased urination.  Genitourinary:  Positive for involuntary urination.  Musculoskeletal:  Positive for joint pain, gait problem, joint pain, joint swelling, myalgias, muscle weakness, morning stiffness, muscle tenderness and myalgias.   Skin:  Positive for color change, rash and nodules/bumps. Negative for hair loss and sensitivity to sunlight.  Allergic/Immunologic: Negative for susceptible to infections.  Neurological:  Positive for headaches. Negative for dizziness.  Hematological:  Negative for swollen glands.  Psychiatric/Behavioral:  Positive for depressed mood. Negative for sleep disturbance. The patient is nervous/anxious.     PMFS History:  Patient Active Problem List   Diagnosis Date Noted   Lower limb ulcer, calf, left, limited to breakdown of skin (HCC) 04/01/2022   Lymphedema 04/01/2022   Trigger finger, left middle finger 10/26/2021   Trigger finger, right ring finger 10/26/2021   Trigger finger, right middle finger 10/26/2021   History of malignant neoplasm of breast 08/23/2021   OAB (overactive bladder) 07/29/2021   Benign neoplasm of ascending colon    Hx of adenomatous colonic polyps    Occult blood in stools    BMI 40.0-44.9, adult (HCC) 01/20/2021   Cervical prolapse 01/20/2021   Incontinence of feces with fecal urgency 01/20/2021   Irritable bowel syndrome with diarrhea 01/20/2021   Mixed incontinence urge and stress 01/20/2021   Unstable angina (HCC) 10/06/2020   Acute on chronic respiratory failure with hypoxia (HCC) 02/28/2019   OSA on CPAP 02/28/2019   Diabetes mellitus type 2, uncontrolled, with complications 02/28/2019   Breast cancer (HCC) 02/27/2019   History of cancer chemotherapy 02/27/2019   History of radiation therapy 02/27/2019   Diabetic neuropathy (HCC) 02/27/2019   HLD (hyperlipidemia) 02/27/2019   Essential hypertension 02/27/2019   QT prolongation 02/27/2019   Pneumonia  due to COVID-19 virus 02/24/2019   Diarrhea    Benign neoplasm of cecum    Abdominal pain, epigastric    Gastroesophageal reflux disease    Dependence on nocturnal oxygen  therapy 07/10/2017   Open wound of left lower leg 12/26/2016   Vitamin D  deficiency 09/14/2016   History of total knee  replacement, bilateral 09/14/2016   DJD (degenerative joint disease), cervical 09/14/2016   Spondylosis of lumbar region without myelopathy or radiculopathy 09/14/2016   High risk medication use 05/20/2016   Spinal stenosis, lumbar region, with neurogenic claudication 09/08/2015    Class: Chronic   OSA (obstructive sleep apnea) 10/17/2013   Dyspnea 09/04/2013   Atypical chest pain 09/04/2013   Morbid obesity (HCC) 03/21/2012   Cancer of lower-outer quadrant of female breast (HCC) 03/15/2012   Diabetes mellitus, type II (HCC) 02/22/2011   Coronary artery disease    Hypertension    Hyperlipidemia    OA (osteoarthritis) of knee     Past Medical History:  Diagnosis Date   Actinic keratosis    Anxiety    Asthma    Albuterol  inhaler as needed.Pulmicort  neb as needed   Breast cancer (HCC)    right - lumpectomy    Chronic back pain    spinal stenosis   Coronary artery disease    COVID    uses o2 since at hs   Dependence on nocturnal oxygen  therapy 07/10/2017   Pt uses 2 liters 02 at night    Depression    takes CYmbalta  and Wellbutrin  daily   Diabetes mellitus    not on any meds/controlled by diet   Dyspnea    with exertion occasionally when lies down   GERD (gastroesophageal reflux disease)    takes Omeprazole  daily   Headache    oocasionally   History of kidney stones    Hyperlipidemia    not on any meds   Hypertension    takes Benazepril ,Bystolic ,and Amlodipine  daily   IBS (irritable bowel syndrome)    Joint pain    Joint swelling    Livedoid vasculitis    Nocturia    OA (osteoarthritis) of knee    Other seborrheic keratosis    Oxygen  deficiency    2 liters at night per pt for OSA- no cpap    Peripheral edema    takes daily as needed   Peripheral neuropathy    Personal history of chemotherapy    Personal history of radiation therapy    Pneumonia    hx of-several yrs ago   RA (rheumatoid arthritis) (HCC)    Urinary frequency    Urinary urgency    Weakness     numbness and tingling mainly in left leg occasionally in right.Tingling/numbness in hands    Family History  Problem Relation Age of Onset   Heart disease Mother    Hyperlipidemia Mother    Lung cancer Father    Diabetes Father    Hyperlipidemia Father    Heart disease Sister    Hypertension Sister    Hyperlipidemia Sister    Heart disease Brother    Diabetes Brother    Hypertension Brother    Hyperlipidemia Brother    Lung cancer Brother    Heart disease Brother    Hypertension Brother    Hyperlipidemia Brother    Heart disease Brother    Hypertension Brother    Hyperlipidemia Brother    Heart disease Maternal Aunt    Asthma Maternal Uncle    Heart disease Maternal  Uncle    Lupus Daughter    Breast cancer Neg Hx    Past Surgical History:  Procedure Laterality Date   BIOPSY  06/24/2021   Procedure: BIOPSY;  Surgeon: Asencion Blacksmith, MD;  Location: [facility];  Service: Endoscopy;;   BREAST LUMPECTOMY  1997   RIGHT BREAST   CARDIAC CATHETERIZATION  04/23/2004   EF 60%   cataract surgery Bilateral    COLONOSCOPY     COLONOSCOPY WITH PROPOFOL  N/A 08/07/2017   Procedure: COLONOSCOPY WITH PROPOFOL ;  Surgeon: Asencion Blacksmith, MD;  Location: [facility];  Service: Endoscopy;  Laterality: N/A;   COLONOSCOPY WITH PROPOFOL  N/A 06/24/2021   Procedure: COLONOSCOPY WITH PROPOFOL ;  Surgeon: Asencion Blacksmith, MD;  Location: [facility];  Service: Endoscopy;  Laterality: N/A;   CORONARY ARTERY BYPASS GRAFT  2005   LIMA GRAFT TO LAD, SAPHENOUS VEIN GRAFT TO THE FIRST DIAGONAL, AND LEFT RADIAL ARTERY GRAFT TO THE OM   ESOPHAGOGASTRODUODENOSCOPY (EGD) WITH PROPOFOL  N/A 08/07/2017   Procedure: ESOPHAGOGASTRODUODENOSCOPY (EGD) WITH PROPOFOL ;  Surgeon: Asencion Blacksmith, MD;  Location: [facility];  Service: Endoscopy;  Laterality: N/A;   FOOT SURGERY Right    JOINT REPLACEMENT     LITHOTRIPSY     LUMBAR LAMINECTOMY/DECOMPRESSION MICRODISCECTOMY N/A 09/08/2015   Procedure: CENTRAL  DECOMPRESSIVE LUMBAR LAMINECTOMIES L3-4, L4-5 AND BILATERAL HEMILAMINECTOMY L5-S1;  Surgeon: Alphonso Jean, MD;  Location: [facility];  Service: Orthopedics;  Laterality: N/A;   nodule removed from left elbow     POLYPECTOMY  06/24/2021   Procedure: POLYPECTOMY;  Surgeon: Asencion Blacksmith, MD;  Location: [facility];  Service: Endoscopy;;   RIGHT/LEFT HEART CATH AND CORONARY/GRAFT ANGIOGRAPHY N/A 10/06/2020   Procedure: RIGHT/LEFT HEART CATH AND CORONARY/GRAFT ANGIOGRAPHY;  Surgeon: Swaz[place], Peter M, MD;  Location: [facility] INVASIVE CV LAB;  Service: Cardiovascular;  Laterality: N/A;   SHOULDER ARTHROSCOPY     TOTAL KNEE ARTHROPLASTY Bilateral    TOTAL SHOULDER ARTHROPLASTY     TRIPLE BYPASS  04/27/05   US  ECHOCARDIOGRAPHY  01/06/2009   EF 55-60%   WRIST SURGERY Left    Social History   Social History Narrative   Not on file   Immunization History  Administered Date(s) Administered   Influenza Split 04/24/2013   PFIZER(Purple Top)SARS-COV-2 Vaccination 09/01/2019, 09/25/2019, 04/04/2020   Pneumococcal Conjugate-13 08/26/2016   Zoster Recombinant(Shingrix ) 10/11/2017, 10/11/2017, 12/22/2017, 12/22/2017     Objective: Vital Signs: BP (!) 159/78 (BP Location: Left Arm, Patient Position: Sitting, Cuff Size: Normal)   Pulse (!) 58   Resp 17   Ht 5\' 4"  (1.626 m)   Wt 229 lb (103.9 kg)   BMI 39.31 kg/m    Physical Exam Vitals and nursing note reviewed.  Constitutional:      Appearance: She is well-developed.  HENT:     Head: Normocephalic and atraumatic.  Eyes:     Conjunctiva/sclera: Conjunctivae normal.  Cardiovascular:     Rate and Rhythm: Normal rate and regular rhythm.     Heart sounds: Normal heart sounds.  Pulmonary:     Effort: Pulmonary effort is normal.     Breath sounds: Normal breath sounds.  Abdominal:     General: Bowel sounds are normal.     Palpations: Abdomen is soft.  Musculoskeletal:     Cervical back: Normal range of motion.  Lymphadenopathy:     Cervical: No  cervical adenopathy.  Skin:    General: Skin is warm and dry.     Capillary Refill: Capillary refill takes less  than 2 seconds.  Neurological:     Mental Status: She is alert and oriented to person, place, and time.  Psychiatric:        Behavior: Behavior normal.      Musculoskeletal Exam: Patient remained seated during the examination today.  C-spine has limited range of motion with lateral rotation.  Thoracic kyphosis noted.  Right shoulder with limited abduction to about 90 degrees.  Left shoulder has full range of motion.  Elbow joints, wrist joints, MCPs, PIPs, DIPs have good range of motion with no synovitis.  PIP, DIP, and CMC joint thickening noted bilaterally.  Hip joints difficult to assess in seated position.  Bilateral knee replacements have good range of motion with no warmth or effusion.  Pedal edema noted in bilateral lower extremities, left greater than right.  CDAI Exam: CDAI Score: -- Patient Global: --; Provider Global: -- Swollen: --; Tender: -- Joint Exam 12/19/2023   No joint exam has been documented for this visit   There is currently no information documented on the homunculus. Go to the Rheumatology activity and complete the homunculus joint exam.  Investigation: No additional findings.  Imaging: No results found.  Recent Labs: Lab Results  Component Value Date   WBC 5.7 11/27/2023   HGB 13.5 11/27/2023   PLT 176 11/27/2023   NA 141 11/27/2023   K 4.2 11/27/2023   CL 102 11/27/2023   CO2 28 11/27/2023   GLUCOSE 203 (H) 11/27/2023   BUN 15 11/27/2023   CREATININE 0.55 (L) 11/27/2023   BILITOT 0.6 11/27/2023   ALKPHOS 54 10/02/2020   AST 30 11/27/2023   ALT 21 11/27/2023   PROT 6.7 11/27/2023   ALBUMIN  4.6 10/02/2020   CALCIUM 9.4 11/27/2023   GFRAA 104 03/02/2020    Speciality Comments: PLQ Eye Exam: 07/06/2023 WNL @ [facility]   Follow up in 6 months  November 18, 2020 high-resolution CT-according to Dr. Villa Greaser lung functions were  preserved and no immunosuppressive therapy needed.  Procedures:  No procedures performed Allergies: Statins, Alirocumab, Aricept [donepezil], Atorvastatin, Crestor [rosuvastatin], Diazepam , Dry skin treatment [albolene], Evolocumab, Ezetimibe , Fenofibrate micronized, Fentanyl , Hydrocodone , Loratadine , Metformin , Morphine  and codeine , Niaspan [niacin], Phentermine-topiramate, Welchol [colesevelam], Zocor [simvastatin], and Azithromycin     Assessment / Plan:     Visit Diagnoses: Rheumatoid arthritis of multiple sites with negative rheumatoid factor (HCC) - She has no synovitis on examination today.  Patient continues to have chronic pain involving multiple joints especially her hands and feet due to underlying osteoarthritis and neuropathy.  No active inflammation was noted on examination.  She is taking Plaquenil  200 mg 1 tablet by mouth twice daily and minocycline  100 mg twice daily.  She is tolerating combination therapy without any side effects. A refill of Plaquenil  sent to the pharmacy today.  She was advised to notify us  if she develops signs or symptoms of a flare.  She will follow-up in the office in 5 months or sooner if needed.   Plan: hydroxychloroquine  (PLAQUENIL ) 200 MG tablet  High risk medication use - Plaquenil  200 mg 1 tablet by mouth twice daily and minocycline  100 mg twice daily. (MTX was discontinued in July 2019 due to lower extremity ulcers).  CBC and CMP updated on 11/27/23.  Lipid panel updated on 11/27/23.  PLQ Eye Exam: 07/06/2023 WNL @ [facility]  Follow up in 6 months.    Rheumatoid nodule of elbow, left [facility]): Rheumatoid nodule noted on the extensor surface of the left elbow.  Pain in  both hands: Patient has chronic pain in both hands due to underlying osteoarthritis and neuropathy.  She had no signs of active rheumatoid arthritis on examination today.  CMC, PIP, DIP thickening consistent with osteoarthritis of both hands noted.  Mucinous cyst noted overlying the  DIP of the right third digit.  No synovitis was noted.  Discussed the importance of joint protection and muscle strengthening.  History of total knee replacement, bilateral: She has good range of motion of both knee replacements on examination today.  No effusion noted.  She has been using a cane to assist with ambulation.  No recent falls.  DDD (degenerative disc disease), cervical: C-spine has limited range of motion with lateral rotation.  She experiences neck pain and stiffness intermittently.  She has not followed back up with her spine specialist recently.  Spondylosis of lumbar spine - X-rays of lumbar spine on 08/19/19 consistent with multilevel spondylosis and DDD.  [facility]  Using cane to assist with ambulation.    Ulcer of left lower extremity, limited to breakdown of skin (HCC) - Non-healing x3 years+.  She is followed by Dr. Clydia Dart.  Patient has noticed improvement in her symptoms since she has been taking minocycline .  She will remain on minocycline  100 mg twice daily. No open ulcer noted on exam today.  Other medical conditions are listed as follows:  Calciphylaxis cutis  Varicose veins of bilateral lower extremities with other complications: Followed by Dr. Vikki Graves with vascular surgery.  History of vitamin D  deficiency  History of hypertension: Blood pressure was elevated today in the office.  History of diabetes mellitus  History of sleep apnea  History of breast cancer  Pedal edema  History of coronary artery disease  Orders: No orders of the defined types were placed in this encounter.  Meds ordered this encounter  Medications   hydroxychloroquine  (PLAQUENIL ) 200 MG tablet    Sig: TAKE 1 TABLET TWICE DAILY FOR RHEUMATOID ARTHRITIS    Dispense:  180 tablet    Refill:  0     Follow-Up Instructions: Return in about 5 months (around 05/20/2024) for Rheumatoid arthritis, Osteoarthritis.   Romayne Clubs, PA-C  Note - This record has  been created using Dragon software.  Chart creation errors have been sought, but may not always  have been located. Such creation errors do not reflect on  the standard of medical care.

## 2023-12-15 ENCOUNTER — Ambulatory Visit: Admitting: Podiatry

## 2023-12-19 ENCOUNTER — Ambulatory Visit: Attending: Physician Assistant | Admitting: Physician Assistant

## 2023-12-19 ENCOUNTER — Encounter: Payer: Self-pay | Admitting: Physician Assistant

## 2023-12-19 VITALS — BP 159/78 | HR 58 | Resp 17 | Ht 64.0 in | Wt 229.0 lb

## 2023-12-19 DIAGNOSIS — L97921 Non-pressure chronic ulcer of unspecified part of left lower leg limited to breakdown of skin: Secondary | ICD-10-CM | POA: Diagnosis not present

## 2023-12-19 DIAGNOSIS — M06322 Rheumatoid nodule, left elbow: Secondary | ICD-10-CM

## 2023-12-19 DIAGNOSIS — Z79899 Other long term (current) drug therapy: Secondary | ICD-10-CM | POA: Diagnosis not present

## 2023-12-19 DIAGNOSIS — M0609 Rheumatoid arthritis without rheumatoid factor, multiple sites: Secondary | ICD-10-CM

## 2023-12-19 DIAGNOSIS — Z853 Personal history of malignant neoplasm of breast: Secondary | ICD-10-CM

## 2023-12-19 DIAGNOSIS — Z8679 Personal history of other diseases of the circulatory system: Secondary | ICD-10-CM

## 2023-12-19 DIAGNOSIS — I83893 Varicose veins of bilateral lower extremities with other complications: Secondary | ICD-10-CM

## 2023-12-19 DIAGNOSIS — M5030 Other cervical disc degeneration, unspecified cervical region: Secondary | ICD-10-CM

## 2023-12-19 DIAGNOSIS — Z8669 Personal history of other diseases of the nervous system and sense organs: Secondary | ICD-10-CM

## 2023-12-19 DIAGNOSIS — R600 Localized edema: Secondary | ICD-10-CM

## 2023-12-19 DIAGNOSIS — Z96653 Presence of artificial knee joint, bilateral: Secondary | ICD-10-CM

## 2023-12-19 DIAGNOSIS — M47816 Spondylosis without myelopathy or radiculopathy, lumbar region: Secondary | ICD-10-CM | POA: Diagnosis not present

## 2023-12-19 DIAGNOSIS — Z8639 Personal history of other endocrine, nutritional and metabolic disease: Secondary | ICD-10-CM | POA: Diagnosis not present

## 2023-12-19 DIAGNOSIS — M79641 Pain in right hand: Secondary | ICD-10-CM | POA: Diagnosis not present

## 2023-12-19 DIAGNOSIS — M79642 Pain in left hand: Secondary | ICD-10-CM

## 2023-12-19 MED ORDER — HYDROXYCHLOROQUINE SULFATE 200 MG PO TABS
ORAL_TABLET | ORAL | 0 refills | Status: DC
Start: 2023-12-19 — End: 2024-04-04

## 2024-01-02 ENCOUNTER — Other Ambulatory Visit (HOSPITAL_COMMUNITY): Payer: Self-pay

## 2024-01-04 ENCOUNTER — Ambulatory Visit (INDEPENDENT_AMBULATORY_CARE_PROVIDER_SITE_OTHER)

## 2024-01-04 ENCOUNTER — Ambulatory Visit: Admitting: Podiatry

## 2024-01-04 DIAGNOSIS — M7752 Other enthesopathy of left foot: Secondary | ICD-10-CM

## 2024-01-04 DIAGNOSIS — M795 Residual foreign body in soft tissue: Secondary | ICD-10-CM

## 2024-01-04 NOTE — Progress Notes (Signed)
 Subjective: Chief Complaint  Patient presents with   Foot Pain    RM 12 Patient is here for left foot pain, pain on the bottom of foot close to the lateral side. Patient states stepping on an object and pain has been presence for the last 3 months. Patient has tried a topical ointment for pain relief.   78 year old female presents the office with above concerns.  She states that she hates wearing shoes and she thinks that she may have stepped on something in the left foot upon the lateral aspect.  She thinks her tetanus is up-to-date.  She does not report any drainage or.  No fevers or chills.  Increased swelling or redness.  Objective: AAO x3, NAD DP/PT pulses palpable bilaterally, CRT less than 3 seconds Left foot just proximal fifth metatarsal base plantarly is a annular hyperkeratotic lesion.  Once I debrided this unable to appreciate any open lesion or any drainage or pus.  There is no palpable foreign object noted.  There is no fluctuation or crepitation.  There is no other areas of discomfort. No pain with calf compression, swelling, warmth, erythema  Assessment: Concern for residual foreign body left foot  Plan: -All treatment options discussed with the patient including all alternatives, risks, complications.  -X-rays obtained reviewed.  Multiple views were obtained.  There is no evidence of acute fracture or foreign object.  Chronic calcifications, and density noted along the calf -Sharply debrided the hyperkeratotic lesion without any complications or bleeding.  Unable to appreciate any foreign object.  Will order an ultrasound to further rule out underlying foreign body.  Discussed moisturizer, offloading.  Monitor closely for any signs or symptoms of infection. -Patient encouraged to call the office with any questions, concerns, change in symptoms.   Charity Conch DPM

## 2024-01-08 DIAGNOSIS — L821 Other seborrheic keratosis: Secondary | ICD-10-CM | POA: Diagnosis not present

## 2024-01-08 DIAGNOSIS — M793 Panniculitis, unspecified: Secondary | ICD-10-CM | POA: Diagnosis not present

## 2024-01-08 DIAGNOSIS — L565 Disseminated superficial actinic porokeratosis (DSAP): Secondary | ICD-10-CM | POA: Diagnosis not present

## 2024-01-08 DIAGNOSIS — I8391 Asymptomatic varicose veins of right lower extremity: Secondary | ICD-10-CM | POA: Diagnosis not present

## 2024-01-08 DIAGNOSIS — L570 Actinic keratosis: Secondary | ICD-10-CM | POA: Diagnosis not present

## 2024-01-09 ENCOUNTER — Telehealth: Payer: Self-pay

## 2024-01-09 NOTE — Telephone Encounter (Signed)
-----   Message from Charity Conch sent at 01/09/2024  7:34 AM EDT ----- Can you please send referral to [facility] for u/s? Thanks!

## 2024-01-09 NOTE — Telephone Encounter (Signed)
 US  order,office note and demographics faxed to [facility]

## 2024-01-10 DIAGNOSIS — I87332 Chronic venous hypertension (idiopathic) with ulcer and inflammation of left lower extremity: Secondary | ICD-10-CM | POA: Diagnosis not present

## 2024-01-10 DIAGNOSIS — M069 Rheumatoid arthritis, unspecified: Secondary | ICD-10-CM | POA: Diagnosis not present

## 2024-01-10 DIAGNOSIS — L03116 Cellulitis of left lower limb: Secondary | ICD-10-CM | POA: Diagnosis not present

## 2024-01-16 DIAGNOSIS — H04123 Dry eye syndrome of bilateral lacrimal glands: Secondary | ICD-10-CM | POA: Diagnosis not present

## 2024-01-16 DIAGNOSIS — H35372 Puckering of macula, left eye: Secondary | ICD-10-CM | POA: Diagnosis not present

## 2024-01-16 DIAGNOSIS — Z79899 Other long term (current) drug therapy: Secondary | ICD-10-CM | POA: Diagnosis not present

## 2024-01-18 NOTE — Progress Notes (Signed)
 Sherrilyn BROCKS Cayson Date of Birth: 1946-05-02   History of Present Illness: [name] is seen for follow up CAD.  She is s/p CABG in 2005 with LIMA to LAD and left radial graft to the first diagonal. Myoview  in March 2018 was normal.  Echo in 2015 unremarkable. She was seen by Dr. Jude and diagnosed with OSA by at home testing. She is currently using nocturnal oxygen . In 2018 she underwent lumbar laminectomy complicated by a dural tear that was repaired. Since that time she has continued to have back pain that limits her activity. When seen in 2019 complained of dyspnea. Echo showed moderate LVH but was otherwise normal. She had followup with pulmonary. PFTs showed moderate restriction.   She was admitted in August 2020 with Covid 19 PNA. Managed with oxygen  therapy and steroids, Remdesivir , and Actemsa. She reports since she had Covid she is still on oxygen  at night.   When seen March 2022 she complained of sweats with activity and also complains that her chest feels heavy. Worse with activity. To evaluate further she underwent cardiac cath showing patent LIMA to LAD and radial graft to OM. SVG to diagonal was occluded. Disease in distal RCA branches and diagonal not amenable to PCI. Normal LV filling pressures and very mild Pulmonary HTN. Based on findings it was felt symptoms were likely more pulmonary related or deconditioning.   After her last visit she was seen by Pharm D for her hyperlipidemia. Trial of Nexletol  recommended. She is taking this every other day. Patient not interested in Leqvio. She is followed by Rheumatology for RA on Plaquenil . She did have the flu a couple of weeks.   She was seen In October by vascular surgery for lymphedema. Venous duplex was normal.   She has chronic leg swelling and ulcers left leg. Has been treated for cellulitis. Denies any new chest pain. Does have chronic SOB which is unchanged.    Current Outpatient Medications on File Prior to Visit  Medication  Sig Dispense Refill   ACCU-CHEK GUIDE test strip      acetaminophen  (TYLENOL ) 500 MG tablet Take 1,000 mg by mouth at bedtime.     albuterol  (VENTOLIN  HFA) 108 (90 Base) MCG/ACT inhaler Inhale 2 puffs into the lungs every 4 (four) hours as needed for wheezing or shortness of breath (cough, shortness of breath or wheezing.). 6.7 g 0   amLODipine  (NORVASC ) 5 MG tablet Take 5 mg by mouth in the morning.     aspirin  EC 81 MG tablet Take 81 mg by mouth at bedtime. Swallow whole.     b complex vitamins capsule Take 1 capsule by mouth daily.     baclofen  (LIORESAL ) 10 MG tablet TAKE 1 TABLET BY MOUTH TWICE A DAY AS NEEDED FOR SPASMS 180 tablet 0   Bempedoic Acid  (NEXLETOL ) 180 MG TABS Take 1 tablet (180 mg total) by mouth daily. 90 tablet 0   blood glucose meter kit and supplies Dispense based on patient and insurance preference. Use up to four times daily as directed. (FOR ICD-10 E10.9, E11.9). 1 each 0   buPROPion  (WELLBUTRIN  XL) 150 MG 24 hr tablet Take 150 mg by mouth in the morning.     busPIRone  (BUSPAR ) 7.5 MG tablet Take 7.5 mg by mouth 2 (two) times daily.     Calcium Carb-Cholecalciferol  (CALCIUM + D3 PO) Take 1 tablet by mouth daily.     cholecalciferol  (VITAMIN D3) 25 MCG (1000 UT) tablet Take 1,000 Units by mouth at  bedtime.     clobetasol  cream (TEMOVATE ) 0.05 % Apply 1 application topically daily.     Cyanocobalamin (VITAMIN B-12 PO) Take 1 capsule by mouth daily.     dicyclomine  (BENTYL ) 10 MG capsule TAKE 1 CAPSULE (10 MG TOTAL) BY MOUTH 3 (THREE) TIMES DAILY BEFORE MEALS. 270 capsule 0   DULoxetine  (CYMBALTA ) 60 MG capsule Take 60 mg by mouth in the morning.     Echinacea 400 MG CAPS Take 400 mg by mouth daily.     ezetimibe  (ZETIA ) 10 MG tablet Take 1 tablet (10 mg total) by mouth daily. (Patient taking differently: Take 10 mg by mouth every other day.) 30 tablet 0   folic acid  (FOLVITE ) 1 MG tablet Take 2 tablets (2 mg total) by mouth daily. 180 tablet 4   gabapentin  (NEURONTIN ) 300  MG capsule Take 2 capsules (600 mg total) by mouth 3 (three) times daily. 540 capsule 3   galantamine  (RAZADYNE  ER) 16 MG 24 hr capsule Take 16 mg by mouth every morning.     HYDROcodone -acetaminophen  (NORCO/VICODIN) 5-325 MG tablet Take 1 tablet by mouth daily as needed (Arthritis).     hydroxychloroquine  (PLAQUENIL ) 200 MG tablet TAKE 1 TABLET TWICE DAILY FOR RHEUMATOID ARTHRITIS 180 tablet 0   loperamide  (IMODIUM  A-D) 2 MG tablet Take 2 mg by mouth 4 (four) times daily as needed for diarrhea or loose stools.     meloxicam  (MOBIC ) 15 MG tablet Take 15 mg by mouth 2 (two) times a week. Wednesdays & Sundays     minocycline  (MINOCIN ) 100 MG capsule Take 1 capsule (100 mg total) by mouth 2 (two) times daily. 60 capsule 2   montelukast  (SINGULAIR ) 10 MG tablet Take 10 mg by mouth at bedtime.     Multiple Vitamins-Minerals (CENTRUM SILVER PO) Take 1 tablet by mouth daily.     Multiple Vitamins-Minerals (HAIR SKIN NAILS PO) Take 1 tablet by mouth daily.     mupirocin  ointment (BACTROBAN ) 2 % Apply 1 application topically daily.     Nebivolol  HCl 20 MG TABS Take 20 mg by mouth at bedtime.     nitroGLYCERIN  (NITROSTAT ) 0.4 MG SL tablet nitroglycerin  0.4 mg sublingual tablet 25 tablet 11   olmesartan -hydrochlorothiazide  (BENICAR  HCT) 40-25 MG tablet Take 1 tablet by mouth in the morning.  5   omeprazole  (PRILOSEC) 20 MG capsule Take 20 mg by mouth in the morning.     oxybutynin (DITROPAN-XL) 10 MG 24 hr tablet Take 10 mg by mouth daily.     OXYGEN  Inhale 2 L into the lungs at bedtime.     Potassium 99 MG TABS Take 99 mg by mouth daily.     Probiotic Product (PROBIOTIC DAILY PO) Take 1 capsule by mouth daily.     psyllium (REGULOID) 0.52 g capsule Take 1.04 g by mouth 2 (two) times daily.     TART CHERRY PO Take 480 mg by mouth 2 (two) times daily.     Current Facility-Administered Medications on File Prior to Visit  Medication Dose Route Frequency Provider Last Rate Last Admin   sodium chloride  flush  (NS) 0.9 % injection 3 mL  3 mL Intravenous Q12H [place], Breah Joa M, MD        Allergies  Allergen Reactions   Statins Other (See Comments)    Joint pain and swelling/burning   Alirocumab Other (See Comments)    (praluent) all sorts of side effects. strange.  she had burning, weakness, felt joints hurt more, felt asthma flared.  Aricept [Donepezil]     leg pains   Atorvastatin     Leg pain   Crestor [Rosuvastatin]     Other reaction(s): even 1/2 of 5 mg 2-3 days a week couldn't take due to muscle side effects.   Diazepam      makes me crazy   Dry Skin Treatment [Albolene]     Other reaction(s): contact dermatitis when in covid hospital   Evolocumab     (Repatha) felt like going to pass out.legs were weak.   Ezetimibe      leg cramps   Fenofibrate Micronized     cramps   Fentanyl  Other (See Comments)    loopy   Hydrocodone      Other reaction(s): Not available   Loratadine      leg cramps   Metformin      upset stomach   Morphine  And Codeine  Other (See Comments)    hallucinations   Niaspan [Niacin]     burning   Phentermine-Topiramate Er     Other reaction(s): just didn't work for her.   Welchol [Colesevelam]     Leg cramps   Zocor [Simvastatin]     muscle aches   Azithromycin  Rash    Legs burning     Past Medical History:  Diagnosis Date   Actinic keratosis    Anxiety    Asthma    Albuterol  inhaler as needed.Pulmicort  neb as needed   Breast cancer (HCC)    right - lumpectomy    Chronic back pain    spinal stenosis   Coronary artery disease    COVID    uses o2 since at hs   Dependence on nocturnal oxygen  therapy 07/10/2017   Pt uses 2 liters 02 at night    Depression    takes CYmbalta  and Wellbutrin  daily   Diabetes mellitus    not on any meds/controlled by diet   Dyspnea    with exertion occasionally when lies down   GERD (gastroesophageal reflux disease)    takes Omeprazole  daily   Headache    oocasionally   History of kidney stones     Hyperlipidemia    not on any meds   Hypertension    takes Benazepril ,Bystolic ,and Amlodipine  daily   IBS (irritable bowel syndrome)    Joint pain    Joint swelling    Livedoid vasculitis    Nocturia    OA (osteoarthritis) of knee    Other seborrheic keratosis    Oxygen  deficiency    2 liters at night per pt for OSA- no cpap    Peripheral edema    takes daily as needed   Peripheral neuropathy    Personal history of chemotherapy    Personal history of radiation therapy    Pneumonia    hx of-several yrs ago   RA (rheumatoid arthritis) (HCC)    Urinary frequency    Urinary urgency    Weakness    numbness and tingling mainly in left leg occasionally in right.Tingling/numbness in hands    Past Surgical History:  Procedure Laterality Date   BIOPSY  06/24/2021   Procedure: BIOPSY;  Surgeon: Aneita Gwendlyn DASEN, MD;  Location: [facility];  Service: Endoscopy;;   BREAST LUMPECTOMY  1997   RIGHT BREAST   CARDIAC CATHETERIZATION  04/23/2004   EF 60%   cataract surgery Bilateral    COLONOSCOPY     COLONOSCOPY WITH PROPOFOL  N/A 08/07/2017   Procedure: COLONOSCOPY WITH PROPOFOL ;  Surgeon: Aneita Gwendlyn DASEN, MD;  Location: [facility];  Service: Endoscopy;  Laterality: N/A;   COLONOSCOPY WITH PROPOFOL  N/A 06/24/2021   Procedure: COLONOSCOPY WITH PROPOFOL ;  Surgeon: Aneita Gwendlyn DASEN, MD;  Location: [facility];  Service: Endoscopy;  Laterality: N/A;   CORONARY ARTERY BYPASS GRAFT  2005   LIMA GRAFT TO LAD, SAPHENOUS VEIN GRAFT TO THE FIRST DIAGONAL, AND LEFT RADIAL ARTERY GRAFT TO THE OM   ESOPHAGOGASTRODUODENOSCOPY (EGD) WITH PROPOFOL  N/A 08/07/2017   Procedure: ESOPHAGOGASTRODUODENOSCOPY (EGD) WITH PROPOFOL ;  Surgeon: Aneita Gwendlyn DASEN, MD;  Location: [facility];  Service: Endoscopy;  Laterality: N/A;   FOOT SURGERY Right    JOINT REPLACEMENT     LITHOTRIPSY     LUMBAR LAMINECTOMY/DECOMPRESSION MICRODISCECTOMY N/A 09/08/2015   Procedure: CENTRAL DECOMPRESSIVE LUMBAR LAMINECTOMIES L3-4,  L4-5 AND BILATERAL HEMILAMINECTOMY L5-S1;  Surgeon: Lynwood FORBES Better, MD;  Location: [facility];  Service: Orthopedics;  Laterality: N/A;   nodule removed from left elbow     POLYPECTOMY  06/24/2021   Procedure: POLYPECTOMY;  Surgeon: Aneita Gwendlyn DASEN, MD;  Location: [facility];  Service: Endoscopy;;   RIGHT/LEFT HEART CATH AND CORONARY/GRAFT ANGIOGRAPHY N/A 10/06/2020   Procedure: RIGHT/LEFT HEART CATH AND CORONARY/GRAFT ANGIOGRAPHY;  Surgeon: [place], Daniela Hernan M, MD;  Location: [facility] INVASIVE CV LAB;  Service: Cardiovascular;  Laterality: N/A;   SHOULDER ARTHROSCOPY     TOTAL KNEE ARTHROPLASTY Bilateral    TOTAL SHOULDER ARTHROPLASTY     TRIPLE BYPASS  04/27/05   US  ECHOCARDIOGRAPHY  01/06/2009   EF 55-60%   WRIST SURGERY Left     Social History   Tobacco Use  Smoking Status Never   Passive exposure: Past  Smokeless Tobacco Never    Social History   Substance and Sexual Activity  Alcohol  Use No   Alcohol /week: 0.0 standard drinks of alcohol     Family History  Problem Relation Age of Onset   Heart disease Mother    Hyperlipidemia Mother    Lung cancer Father    Diabetes Father    Hyperlipidemia Father    Heart disease Sister    Hypertension Sister    Hyperlipidemia Sister    Heart disease Brother    Diabetes Brother    Hypertension Brother    Hyperlipidemia Brother    Lung cancer Brother    Heart disease Brother    Hypertension Brother    Hyperlipidemia Brother    Heart disease Brother    Hypertension Brother    Hyperlipidemia Brother    Heart disease Maternal Aunt    Asthma Maternal Uncle    Heart disease Maternal Uncle    Lupus Daughter    Breast cancer Neg Hx     Review of Systems: As noted history of present illness.  All other systems were reviewed and are negative.  Physical Exam: BP 134/74 (BP Location: Left Arm, Patient Position: Sitting, Cuff Size: Normal)   Pulse 60   Ht 5' 4 (1.626 m)   Wt 228 lb (103.4 kg)   SpO2 96%   BMI 39.14 kg/m  GENERAL:  Well  appearing obese WF uses a cane to walk. HEENT:  PERRL, EOMI, sclera are clear. Oropharynx is clear. NECK:  No jugular venous distention, carotid upstroke brisk and symmetric, no bruits, no thyromegaly or adenopathy LUNGS:  Clear to auscultation bilaterally CHEST:  Unremarkable HEART:  RRR,  PMI not displaced or sustained,S1 and S2 within normal limits, no S3, no S4: no clicks, no rubs, no murmurs ABD:  Soft, nontender. BS +, no masses or bruits. No hepatomegaly, no splenomegaly EXT:  2 + pulses throughout, chronic skin thickening with firmness. Early redness, no cyanosis no clubbing. Old surgical scar left wrist from radial harvest.  SKIN:  Warm and dry.  No rashes NEURO:  Alert and oriented x 3. Cranial nerves II through XII intact. PSYCH:  Cognitively intact    LABORATORY DATA:  Lab Results  Component Value Date   WBC 5.7 11/27/2023   HGB 13.5 11/27/2023   HCT 40.6 11/27/2023   PLT 176 11/27/2023   GLUCOSE 203 (H) 11/27/2023   CHOL 167 11/27/2023   TRIG 212 (H) 11/27/2023   HDL 47 (L) 11/27/2023   LDLCALC 89 11/27/2023   ALT 21 11/27/2023   AST 30 11/27/2023   NA 141 11/27/2023   K 4.2 11/27/2023   CL 102 11/27/2023   CREATININE 0.55 (L) 11/27/2023   BUN 15 11/27/2023   CO2 28 11/27/2023   TSH 2.93 07/28/2017   INR 1.0 10/02/2020   HGBA1C 8.1 (H) 02/26/2019   Labs dated 08/02/16: cholesterol 286, triglycerides 181, HDL 52, LDL 198, A1c 6.5%. Dated 08/25/17: cholesterol 257, triglycerides 121, HDL 59, LDL 174. A1c 7.6%. Normal chemistries, TSH, Hgb.  Dated 07/03/19: cholesterol 192, triglycerides 159, HDL 45, LDL 115. A1c 6.0%.  Dated 11/19/19: cholesterol 195, triglycerides 126, HDL 48, LDL 120.  Dated 08/18/20: A1c 7%.  Dated 12/08/20: cholesterol 139, triglycerides 127, HDL 42, LDL 72.  Dated 04/23/21: A1c 6.6% Dated 01/26/22: cholesterol 209, triglycerides 155, HDL 52, LDL 126. A1c 7%. TSH normal.  Dated 11/27/23: cholesterol 167, triglycerides 212, HDL 47, LDL 89. A1c 7.4%.  CBC and CMET normal  EKG Interpretation Date/Time:  Wednesday January 24 2024 11:11:43 EDT Ventricular Rate:  60 PR Interval:  170 QRS Duration:  90 QT Interval:  462 QTC Calculation: 462 R Axis:   37  Text Interpretation: Normal sinus rhythm Normal ECG When compared with ECG of Nov 24, 2022 No significant change was found Confirmed by [place], Areana Kosanke [id number]) on 01/24/2024 11:13:42 AM    Echo: 2/16/15Study Conclusions  - Left ventricle: The cavity size was normal. Wall thickness   was normal. Systolic function was normal. The estimated   ejection fraction was in the range of 50% to 55%. - Mitral valve: Nodular calcification of anterior leaflet   tip Mild regurgitation. - Left atrium: The atrium was moderately dilated. - Atrial septum: No defect or patent foramen ovale was   identified. - Pericardium, extracardiac: A trivial pericardial effusion   was identified posterior to the heart. - Impressions: Elevated E/E' ratio suggested diastolic   dysfunction with elevated EDP Impressions:  - Elevated E/E' ratio suggested diastolic dysfunction with   elevated EDP   Myoview  09/29/16: Study Highlights     The left ventricular ejection fraction is mildly decreased (45-54%). Nuclear stress EF: 51%. There was no ST segment deviation noted during stress. The study is normal. This is a low risk study.   Normal stress nuclear study with no ischemia or infarction; EF 51 with normal wall motion; cannot R/O left breast mass; suggest mammogram if not performed recently.     Echo 09/20/17: Study Conclusions   - Left ventricle: Wall thickness was increased in a pattern of    moderate LVH. Systolic function was normal. The estimated    ejection fraction was in the range of 55% to 60%. Features are    consistent with a pseudonormal left ventricular filling pattern,    with concomitant abnormal relaxation and increased filling    pressure (grade 2 diastolic dysfunction).  Cardiac cath 10/06/20:   RIGHT/LEFT HEART CATH AND CORONARY/GRAFT ANGIOGRAPHY    Conclusion    Prox LAD to Mid LAD lesion is 100% stenosed. 1st Diag lesion is 80% stenosed. 1st Mrg lesion is 100% stenosed. 1st Sept lesion is 90% stenosed. RPDA lesion is 90% stenosed. 2nd RPL lesion is 70% stenosed. RPAV lesion is 50% stenosed. SVG graft was visualized by angiography. Origin to Prox Graft lesion is 100% stenosed. Left radial artery graft was visualized by angiography and is normal in caliber. LIMA graft was visualized by non-selective angiography and is normal in caliber. The left ventricular systolic function is normal. LV end diastolic pressure is normal. The left ventricular ejection fraction is 55-65% by visual estimate. Hemodynamic findings consistent with mild pulmonary hypertension.   1. 3 vessel obstructive CAD    - 100% mid LAD    - 80% first diagonal, 90% first septal perforator    - 100% OM1    - 90% mid PDA, 70% PLOM2 2. Patent LIMA to the LAD 3. Patent radial artery graft to the OM1 4. Occluded SVG to diagonal 5. Normal LV function 6. Normal LV filling pressures mean PCWP 12 mm Hg 7. Mild pulmonary HTN with PA mean pressure 25 mm Hg 8. Normal cardiac output.   Plan: recommend continued medical therapy. While the diagonal and PDA could be causing some ischemia these vessels are relatively small and not ideal for PCI. I suspect based on these findings that most of her symptoms are more related to pulmonary disease and deconditioning/obesity.    Coronary Diagrams   Diagnostic Dominance: Right    Intervention  Hemo Data  Flowsheet Row Most Recent Value  Fick Cardiac Output 5.89 L/min  Fick Cardiac Output Index 2.77 (L/min)/BSA  RA A Wave 9 mmHg  RA V Wave 6 mmHg  RA Mean 4 mmHg  RV Systolic Pressure 42 mmHg  RV Diastolic Pressure 1 mmHg  RV EDP 8 mmHg  PA Systolic Pressure 44 mmHg  PA Diastolic Pressure 9 mmHg  PA Mean 25 mmHg  PW A Wave 17 mmHg  PW V Wave 17 mmHg  PW  Mean 12 mmHg  AO Systolic Pressure 163 mmHg  AO Diastolic Pressure 69 mmHg  AO Mean 103 mmHg  LV Systolic Pressure 187 mmHg  LV Diastolic Pressure 13 mmHg  LV EDP 24 mmHg  AOp Systolic Pressure 174 mmHg  AOp Diastolic Pressure 75 mmHg  AOp Mean Pressure 114 mmHg  LVp Systolic Pressure 181 mmHg  LVp Diastolic Pressure 11 mmHg  LVp EDP Pressure 24 mmHg  QP/QS 1  TPVR Index 9.05 HRUI  TSVR Index 37.27 HRUI  PVR SVR Ratio 0.13  TPVR/TSVR Ratio 0.24      Assessment / Plan: 1. Coronary disease.  She is status post CABG in 2005. Nuclear stress test in March 2018 was normal.  She Is on optimal medical therapy.  Continue Bystolic  and amlodipine . On ASA. Cardiac cath March 2022  showed occlusion of SVG to diagonal with patent LIMA to LAD and radial graft to OM. Mild pulmonary HTN with normal LV filling pressures. Symptoms are stable. Continue current therapy  2. Hypertension, well controlled. Continue current medication  3. Hyperlipidemia.  Statin intolerant. Now on Nexletol  and Zetia . LDL has improved.  Not interested in injectables.    4. Diabetes mellitus type 2. Per PCP  5. Morbid obesity with OSA  6. Chronic lymphedema with cellulitis and ulcers

## 2024-01-22 DIAGNOSIS — I87332 Chronic venous hypertension (idiopathic) with ulcer and inflammation of left lower extremity: Secondary | ICD-10-CM | POA: Diagnosis not present

## 2024-01-22 DIAGNOSIS — E1151 Type 2 diabetes mellitus with diabetic peripheral angiopathy without gangrene: Secondary | ICD-10-CM | POA: Diagnosis not present

## 2024-01-22 DIAGNOSIS — K219 Gastro-esophageal reflux disease without esophagitis: Secondary | ICD-10-CM | POA: Diagnosis not present

## 2024-01-22 DIAGNOSIS — I10 Essential (primary) hypertension: Secondary | ICD-10-CM | POA: Diagnosis not present

## 2024-01-22 DIAGNOSIS — J441 Chronic obstructive pulmonary disease with (acute) exacerbation: Secondary | ICD-10-CM | POA: Diagnosis not present

## 2024-01-24 ENCOUNTER — Encounter: Payer: Self-pay | Admitting: Cardiology

## 2024-01-24 ENCOUNTER — Ambulatory Visit: Attending: Cardiology | Admitting: Cardiology

## 2024-01-24 VITALS — BP 134/74 | HR 60 | Ht 64.0 in | Wt 222.0 lb

## 2024-01-24 DIAGNOSIS — R0602 Shortness of breath: Secondary | ICD-10-CM | POA: Diagnosis not present

## 2024-01-24 DIAGNOSIS — I10 Essential (primary) hypertension: Secondary | ICD-10-CM | POA: Diagnosis not present

## 2024-01-24 DIAGNOSIS — E7800 Pure hypercholesterolemia, unspecified: Secondary | ICD-10-CM

## 2024-01-24 DIAGNOSIS — I25708 Atherosclerosis of coronary artery bypass graft(s), unspecified, with other forms of angina pectoris: Secondary | ICD-10-CM

## 2024-01-24 NOTE — Patient Instructions (Signed)
 Medication Instructions:  The current medical regimen is effective;  continue present plan and medications.  *If you need a refill on your cardiac medications before your next appointment, please call your pharmacy*  Lab Work: None If you have labs (blood work) drawn today and your tests are completely normal, you will receive your results only by: MyChart Message (if you have MyChart) OR A paper copy in the mail If you have any lab test that is abnormal or we need to change your treatment, we will call you to review the results.  Testing/Procedures: None  Follow-Up: At [facility], you and your health needs are our priority.  As part of our continuing mission to provide you with exceptional heart care, our providers are all part of one team.  This team includes your primary Cardiologist (physician) and Advanced Practice Providers or APPs (Physician Assistants and Nurse Practitioners) who all work together to provide you with the care you need, when you need it.  Your next appointment:   1 year - Call in March to book July appointment   Provider:   Dr. [place]   We recommend signing up for the patient portal called MyChart.  Sign up information is provided on this After Visit Summary.  MyChart is used to connect with patients for Virtual Visits (Telemedicine).  Patients are able to view lab/test results, encounter notes, upcoming appointments, etc.  Non-urgent messages can be sent to your provider as well.   To learn more about what you can do with MyChart, go to [email or web address].

## 2024-01-31 DIAGNOSIS — Z124 Encounter for screening for malignant neoplasm of cervix: Secondary | ICD-10-CM | POA: Diagnosis not present

## 2024-01-31 DIAGNOSIS — Z6838 Body mass index (BMI) 38.0-38.9, adult: Secondary | ICD-10-CM | POA: Diagnosis not present

## 2024-02-17 ENCOUNTER — Other Ambulatory Visit: Payer: Self-pay | Admitting: Rheumatology

## 2024-02-17 DIAGNOSIS — L97921 Non-pressure chronic ulcer of unspecified part of left lower leg limited to breakdown of skin: Secondary | ICD-10-CM

## 2024-02-19 NOTE — Telephone Encounter (Signed)
 Last Fill: 11/27/2023  Labs: 11/27/2023 Glucose is elevated.  Triglycerides are elevated and HDL is low.  CBC normal   Next Visit: 05/20/2024  Last Visit: 12/19/2023  DX: Rheumatoid arthritis of multiple sites with negative rheumatoid factor (HCC)   Current Dose per office note 12/19/2023: minocycline  100 mg twice daily   Okay to refill Minocycline ?

## 2024-04-04 ENCOUNTER — Other Ambulatory Visit (HOSPITAL_COMMUNITY): Payer: Self-pay

## 2024-04-04 ENCOUNTER — Other Ambulatory Visit: Payer: Self-pay | Admitting: Physician Assistant

## 2024-04-04 DIAGNOSIS — M0609 Rheumatoid arthritis without rheumatoid factor, multiple sites: Secondary | ICD-10-CM

## 2024-04-05 ENCOUNTER — Other Ambulatory Visit (HOSPITAL_COMMUNITY): Payer: Self-pay

## 2024-04-05 ENCOUNTER — Other Ambulatory Visit: Payer: Self-pay

## 2024-04-05 MED ORDER — HYDROXYCHLOROQUINE SULFATE 200 MG PO TABS
200.0000 mg | ORAL_TABLET | Freq: Two times a day (BID) | ORAL | 0 refills | Status: DC
  Filled 2024-04-05: qty 60, 30d supply, fill #0

## 2024-04-05 NOTE — Telephone Encounter (Signed)
 Last Fill: 12/19/2023  Eye exam: 07/06/2023 WNL   Labs: 11/27/2023 Glucose is elevated.  Triglycerides are elevated and HDL is low.  CBC normal   Next Visit: 05/20/2024  Last Visit: 12/19/2023  IK:Myzlfjunpi arthritis of multiple sites with negative rheumatoid factor (HCC)   Current Dose per office note 12/19/2023: Plaquenil  200 mg 1 tablet by mouth twice daily   Okay to refill Plaquenil ?

## 2024-04-22 ENCOUNTER — Other Ambulatory Visit: Payer: Self-pay | Admitting: Cardiology

## 2024-04-22 DIAGNOSIS — E7800 Pure hypercholesterolemia, unspecified: Secondary | ICD-10-CM

## 2024-04-22 DIAGNOSIS — I25708 Atherosclerosis of coronary artery bypass graft(s), unspecified, with other forms of angina pectoris: Secondary | ICD-10-CM

## 2024-04-24 ENCOUNTER — Telehealth: Payer: Self-pay | Admitting: Pharmacy Technician

## 2024-04-24 NOTE — Telephone Encounter (Signed)
   Pharmacy Patient Advocate Encounter   Received notification from CoverMyMeds that prior authorization for nexletol  is required/requested.   Insurance verification completed.   The patient is insured through [facility] ADVANTAGE/RX ADVANCE.   Per test claim: PA required; PA submitted to above mentioned insurance via Latent Key/confirmation #/EOC [id number] Status is pending

## 2024-04-25 NOTE — Telephone Encounter (Signed)
 Pharmacy Patient Advocate Encounter  Received notification from [employer] that Prior Authorization for nexletol  has been APPROVED from 04/24/24 to 04/24/25   PA #/Case ID/Reference #: [id number]

## 2024-05-06 NOTE — Progress Notes (Signed)
 Office Visit Note  Patient: [name]             Date of Birth: 1945/08/06           MRN: [id number]             PCP: Shayne Anes, MD Referring: Shayne Anes, MD Visit Date: 05/20/2024 Occupation: Data Unavailable  Subjective:  Medication monitoring   History of Present Illness: [name] is a 78 y.o. female with history of rheumatoid arthritis.  Patient is currently taking Plaquenil  200 mg 1 tablet by mouth twice daily and minocycline  100 mg twice daily.  She is tolerating combination therapy without any side effects.  Patient continues to experience intermittent soreness and stiffness involving multiple joints.  She experiences neuropathy involving bilateral lower extremities which interferes with her quality of life.  He has been using a cane to assist with ambulation and denies any recent falls.  She has been taking Tylenol  at bedtime and takes Norco during the day as needed for pain relief.  She denies any increase joint swelling at this time.    Activities of Daily Living:  Patient reports morning stiffness for 2 hours.   Patient Reports nocturnal pain.  Difficulty dressing/grooming: Reports Difficulty climbing stairs: Reports Difficulty getting out of chair: Reports Difficulty using hands for taps, buttons, cutlery, and/or writing: Reports  Review of Systems  Constitutional:  Positive for fatigue.  HENT:  Positive for mouth dryness and sore tongue.   Eyes:  Positive for dryness.  Respiratory:  Positive for shortness of breath.   Cardiovascular:  Negative for chest pain and palpitations.  Gastrointestinal:  Positive for diarrhea. Negative for blood in stool and constipation.  Endocrine: Negative for increased urination.  Genitourinary:  Positive for involuntary urination.  Musculoskeletal:  Positive for joint pain, gait problem, joint pain, joint swelling, myalgias, muscle weakness, morning stiffness, muscle tenderness and myalgias.  Skin:  Positive for rash.  Negative for color change, hair loss and sensitivity to sunlight.  Allergic/Immunologic: Negative for susceptible to infections.  Neurological:  Positive for dizziness and headaches.  Hematological:  Negative for swollen glands.  Psychiatric/Behavioral:  Negative for depressed mood and sleep disturbance. The patient is nervous/anxious.     PMFS History:  Patient Active Problem List   Diagnosis Date Noted   Lower limb ulcer, calf, left, limited to breakdown of skin (HCC) 04/01/2022   Lymphedema 04/01/2022   Trigger finger, left middle finger 10/26/2021   Trigger finger, right ring finger 10/26/2021   Trigger finger, right middle finger 10/26/2021   History of malignant neoplasm of breast 08/23/2021   OAB (overactive bladder) 07/29/2021   Benign neoplasm of ascending colon    Hx of adenomatous colonic polyps    Occult blood in stools    BMI 40.0-44.9, adult (HCC) 01/20/2021   Cervical prolapse 01/20/2021   Incontinence of feces with fecal urgency 01/20/2021   Irritable bowel syndrome with diarrhea 01/20/2021   Mixed incontinence urge and stress 01/20/2021   Unstable angina (HCC) 10/06/2020   Acute on chronic respiratory failure with hypoxia (HCC) 02/28/2019   OSA on CPAP 02/28/2019   Diabetes mellitus type 2, uncontrolled, with complications 02/28/2019   Breast cancer (HCC) 02/27/2019   History of cancer chemotherapy 02/27/2019   History of radiation therapy 02/27/2019   Diabetic neuropathy (HCC) 02/27/2019   HLD (hyperlipidemia) 02/27/2019   Essential hypertension 02/27/2019   QT prolongation 02/27/2019   Pneumonia due to COVID-19 virus 02/24/2019   Diarrhea  Benign neoplasm of cecum    Abdominal pain, epigastric    Gastroesophageal reflux disease    Dependence on nocturnal oxygen  therapy 07/10/2017   Open wound of left lower leg 12/26/2016   Vitamin D  deficiency 09/14/2016   History of total knee replacement, bilateral 09/14/2016   DJD (degenerative joint disease),  cervical 09/14/2016   Spondylosis of lumbar region without myelopathy or radiculopathy 09/14/2016   High risk medication use 05/20/2016   Spinal stenosis, lumbar region, with neurogenic claudication 09/08/2015    Class: Chronic   OSA (obstructive sleep apnea) 10/17/2013   Dyspnea 09/04/2013   Atypical chest pain 09/04/2013   Morbid obesity (HCC) 03/21/2012   Cancer of lower-outer quadrant of female breast (HCC) 03/15/2012   Diabetes mellitus, type II (HCC) 02/22/2011   Coronary artery disease    Hypertension    Hyperlipidemia    OA (osteoarthritis) of knee     Past Medical History:  Diagnosis Date   Actinic keratosis    Anxiety    Asthma    Albuterol  inhaler as needed.Pulmicort  neb as needed   Breast cancer (HCC)    right - lumpectomy    Chronic back pain    spinal stenosis   Coronary artery disease    COVID    uses o2 since at hs   Dependence on nocturnal oxygen  therapy 07/10/2017   Pt uses 2 liters 02 at night    Depression    takes CYmbalta  and Wellbutrin  daily   Diabetes mellitus    not on any meds/controlled by diet   Dyspnea    with exertion occasionally when lies down   GERD (gastroesophageal reflux disease)    takes Omeprazole  daily   Headache    oocasionally   History of kidney stones    Hyperlipidemia    not on any meds   Hypertension    takes Benazepril ,Bystolic ,and Amlodipine  daily   IBS (irritable bowel syndrome)    Joint pain    Joint swelling    Livedoid vasculitis    Nocturia    OA (osteoarthritis) of knee    Other seborrheic keratosis    Oxygen  deficiency    2 liters at night per pt for OSA- no cpap    Peripheral edema    takes daily as needed   Peripheral neuropathy    Personal history of chemotherapy    Personal history of radiation therapy    Pneumonia    hx of-several yrs ago   RA (rheumatoid arthritis) (HCC)    Urinary frequency    Urinary urgency    Weakness    numbness and tingling mainly in left leg occasionally in  right.Tingling/numbness in hands    Family History  Problem Relation Age of Onset   Heart disease Mother    Hyperlipidemia Mother    Lung cancer Father    Diabetes Father    Hyperlipidemia Father    Heart disease Sister    Hypertension Sister    Hyperlipidemia Sister    Heart disease Brother    Diabetes Brother    Hypertension Brother    Hyperlipidemia Brother    Lung cancer Brother    Heart disease Brother    Hypertension Brother    Hyperlipidemia Brother    Heart disease Brother    Hypertension Brother    Hyperlipidemia Brother    Heart disease Maternal Aunt    Asthma Maternal Uncle    Heart disease Maternal Uncle    Lupus Daughter    Breast cancer  Neg Hx    Past Surgical History:  Procedure Laterality Date   BIOPSY  06/24/2021   Procedure: BIOPSY;  Surgeon: Aneita Gwendlyn DASEN, MD;  Location: [facility];  Service: Endoscopy;;   BREAST LUMPECTOMY Right 1997   RIGHT BREAST   CARDIAC CATHETERIZATION  04/23/2004   EF 60%   cataract surgery Bilateral    COLONOSCOPY     COLONOSCOPY WITH PROPOFOL  N/A 08/07/2017   Procedure: COLONOSCOPY WITH PROPOFOL ;  Surgeon: Aneita Gwendlyn DASEN, MD;  Location: [facility];  Service: Endoscopy;  Laterality: N/A;   COLONOSCOPY WITH PROPOFOL  N/A 06/24/2021   Procedure: COLONOSCOPY WITH PROPOFOL ;  Surgeon: Aneita Gwendlyn DASEN, MD;  Location: [facility];  Service: Endoscopy;  Laterality: N/A;   CORONARY ARTERY BYPASS GRAFT  2005   LIMA GRAFT TO LAD, SAPHENOUS VEIN GRAFT TO THE FIRST DIAGONAL, AND LEFT RADIAL ARTERY GRAFT TO THE OM   ESOPHAGOGASTRODUODENOSCOPY (EGD) WITH PROPOFOL  N/A 08/07/2017   Procedure: ESOPHAGOGASTRODUODENOSCOPY (EGD) WITH PROPOFOL ;  Surgeon: Aneita Gwendlyn DASEN, MD;  Location: [facility];  Service: Endoscopy;  Laterality: N/A;   FOOT SURGERY Right    JOINT REPLACEMENT     LITHOTRIPSY     LUMBAR LAMINECTOMY/DECOMPRESSION MICRODISCECTOMY N/A 09/08/2015   Procedure: CENTRAL DECOMPRESSIVE LUMBAR LAMINECTOMIES L3-4, L4-5 AND  BILATERAL HEMILAMINECTOMY L5-S1;  Surgeon: Lynwood FORBES Better, MD;  Location: [facility];  Service: Orthopedics;  Laterality: N/A;   nodule removed from left elbow     POLYPECTOMY  06/24/2021   Procedure: POLYPECTOMY;  Surgeon: Aneita Gwendlyn DASEN, MD;  Location: [facility];  Service: Endoscopy;;   RIGHT/LEFT HEART CATH AND CORONARY/GRAFT ANGIOGRAPHY N/A 10/06/2020   Procedure: RIGHT/LEFT HEART CATH AND CORONARY/GRAFT ANGIOGRAPHY;  Surgeon: Jordan, Peter M, MD;  Location: [facility];  Service: Cardiovascular;  Laterality: N/A;   SHOULDER ARTHROSCOPY     TOTAL KNEE ARTHROPLASTY Bilateral    TOTAL SHOULDER ARTHROPLASTY     TRIPLE BYPASS  04/27/2005   US  ECHOCARDIOGRAPHY  01/06/2009   EF 55-60%   WRIST SURGERY Left    Social History   Tobacco Use   Smoking status: Never    Passive exposure: Past   Smokeless tobacco: Never  Vaping Use   Vaping status: Never Used  Substance Use Topics   Alcohol  use: No    Alcohol /week: 0.0 standard drinks of alcohol    Drug use: Never   Social History   Social History Narrative   Not on file     Immunization History  Administered Date(s) Administered   Influenza Split 04/24/2013   PFIZER(Purple Top)SARS-COV-2 Vaccination 09/01/2019, 09/25/2019, 04/04/2020   Pneumococcal Conjugate-13 08/26/2016   Zoster Recombinant(Shingrix ) 10/11/2017, 10/11/2017, 12/22/2017, 12/22/2017     Objective: Vital Signs: BP 129/70 (BP Location: Left Arm, Patient Position: Sitting, Cuff Size: Normal)   Pulse 62   Temp (!) 97.4 F (36.3 C)   Resp 18   Ht 5' 4 (1.626 m)   Wt 216 lb (98 kg)   BMI 37.08 kg/m    Physical Exam Vitals and nursing note reviewed.  Constitutional:      Appearance: She is well-developed.  HENT:     Head: Normocephalic and atraumatic.  Eyes:     Conjunctiva/sclera: Conjunctivae normal.  Cardiovascular:     Rate and Rhythm: Normal rate and regular rhythm.     Heart sounds: Normal heart sounds.  Pulmonary:     Effort: Pulmonary  effort is normal.     Breath sounds: Normal breath sounds.  Abdominal:     General: Bowel sounds  are normal.     Palpations: Abdomen is soft.  Musculoskeletal:     Cervical back: Normal range of motion.  Lymphadenopathy:     Cervical: No cervical adenopathy.  Skin:    General: Skin is warm and dry.     Capillary Refill: Capillary refill takes less than 2 seconds.  Neurological:     Mental Status: She is alert and oriented to person, place, and time.  Psychiatric:        Behavior: Behavior normal.      Musculoskeletal Exam: Patient remained seated during the exam. C-spine has limited ROM.  Thoracic kyphosis.   Right shoulder has limited abduction to 90 degrees. Left shoulder has good ROM.  Elbow joints, wrist joints, Mcps, PIPs, and DIPs good ROM with no synovitis. PIP and DIP thickening.  CMC joint thickening.  Hip joint ROM limited.  Bilateral knee replacements have good ROM.  Pedal edema bilateral LE.    CDAI Exam: CDAI Score: -- Patient Global: --; Provider Global: -- Swollen: --; Tender: -- Joint Exam 05/20/2024   No joint exam has been documented for this visit   There is currently no information documented on the homunculus. Go to the Rheumatology activity and complete the homunculus joint exam.  Investigation: No additional findings.  Imaging: MM 3D SCREENING MAMMOGRAM BILATERAL BREAST Result Date: 05/19/2024 CLINICAL DATA:  Screening. EXAM: DIGITAL SCREENING BILATERAL MAMMOGRAM WITH TOMOSYNTHESIS AND CAD TECHNIQUE: Bilateral screening digital craniocaudal and mediolateral oblique mammograms were obtained. Bilateral screening digital breast tomosynthesis was performed. The images were evaluated with computer-aided detection. COMPARISON:  Previous exam(s). ACR Breast Density Category b: There are scattered areas of fibroglandular density. FINDINGS: There are no findings suspicious for malignancy. IMPRESSION: No mammographic evidence of malignancy. A result letter of this  screening mammogram will be mailed directly to the patient. RECOMMENDATION: Screening mammogram in one year. (Code:SM-B-01Y) BI-RADS CATEGORY  1: Negative. Electronically Signed   By: Dina  Arceo M.D.   On: 05/19/2024 13:47    Recent Labs: Lab Results  Component Value Date   WBC 5.7 11/27/2023   HGB 13.5 11/27/2023   PLT 176 11/27/2023   NA 141 11/27/2023   K 4.2 11/27/2023   CL 102 11/27/2023   CO2 28 11/27/2023   GLUCOSE 203 (H) 11/27/2023   BUN 15 11/27/2023   CREATININE 0.55 (L) 11/27/2023   BILITOT 0.6 11/27/2023   ALKPHOS 54 10/02/2020   AST 30 11/27/2023   ALT 21 11/27/2023   PROT 6.7 11/27/2023   ALBUMIN  4.6 10/02/2020   CALCIUM 9.4 11/27/2023   GFRAA 104 03/02/2020    Speciality Comments: PLQ Eye Exam: 07/06/2023 WNL @ [facility]   Follow up in 6 months  November 18, 2020 high-resolution CT-according to Dr. Jude lung functions were preserved and no immunosuppressive therapy needed.  Procedures:  No procedures performed Allergies: Statins, Alirocumab, Aquamed, Atorvastatin, Colesevelam, Diazepam , Donepezil, Dry skin treatment [albolene], Evolocumab, Ezetimibe , Fenofibrate micronized, Fentanyl , Hydrocodone , Loratadine , Metformin , Morphine  and codeine , Niacin, Phentermine-topiramate er, Phentermine-topiramate er, Rosuvastatin, Simvastatin, and Azithromycin    Assessment / Plan:     Visit Diagnoses: Rheumatoid arthritis of multiple sites with negative rheumatoid factor (HCC): She is not exhibiting any signs or symptoms of a rheumatoid arthritis currently.  No active synovitis was noted on exam.  She has been clinically doing well taking Plaquenil  200 mg 1 tablet by mouth twice daily minocycline  100 mg twice daily.  She is tolerating combination therapy without any side effects.  She continues to experience some soreness  and stiffness due to underlying osteoarthritis as well as pain from neuropathy involving both feet. She takes Norco in the afternoons for pain relief  and takes tylenol  at bedtime for pain relief.   No medication changes will be made at this time.  She was advised to notify us  if she develops any signs or symptoms of a flare.  She will follow-up in the office in 5 months or sooner if needed.  High risk medication use - Plaquenil  200 mg 1 tablet by mouth twice daily and minocycline  100 mg twice daily. (MTX was discontinued in July 2019 due to lower extremity ulcers). CBC and CMP updated on 11/27/23. CBC and CMP orders released today  Lipid panel updated on 11/27/23.  PLQ Eye Exam: 07/06/2023 WNL @ [facility]  Follow up in 6 months   Rheumatoid nodule of elbow, left (HCC) - Rheumatoid nodule noted on the extensor surface of the left elbow.  Pain in both hands: She has no synovitis on examination today.  History of total knee replacement, bilateral: Doing well.  No warmth or effusion.  Using cane to assist with ambulation.   DDD (degenerative disc disease), cervical: C-spine has limited ROM with lateral rotation.   Spondylosis of lumbar spine - X-rays of lumbar spine on 08/19/19 consistent with multilevel spondylosis and DDD.  [facility]  Using cane to assist with ambulation.  No recent falls.   Varicose veins of bilateral lower extremities with other complications - Non-healing x3 years+.  She is followed by Dr. Gershon.  Patient has noticed improvement in her symptoms since she has been taking minocycline .  No open wound currently.   Ulcer of left lower extremity, limited to breakdown of skin (HCC)  Calciphylaxis cutis  History of vitamin D  deficiency: She is taking a calcium and vitamin D  supplement daily.   History of hypertension: BP was 129/70 today in the office.   History of diabetes mellitus  History of sleep apnea  History of breast cancer  Pedal edema  History of coronary artery disease  Orders: No orders of the defined types were placed in this encounter.  No orders of the defined types  were placed in this encounter.    Follow-Up Instructions: Return in about 5 months (around 10/18/2024) for Rheumatoid arthritis.   Waddell CHRISTELLA Craze, PA-C  Note - This record has been created using Dragon software.  Chart creation errors have been sought, but may not always  have been located. Such creation errors do not reflect on  the standard of medical care.

## 2024-05-07 ENCOUNTER — Other Ambulatory Visit: Payer: Self-pay | Admitting: Internal Medicine

## 2024-05-07 DIAGNOSIS — Z1231 Encounter for screening mammogram for malignant neoplasm of breast: Secondary | ICD-10-CM

## 2024-05-08 DIAGNOSIS — R058 Other specified cough: Secondary | ICD-10-CM | POA: Diagnosis not present

## 2024-05-08 DIAGNOSIS — G4733 Obstructive sleep apnea (adult) (pediatric): Secondary | ICD-10-CM | POA: Diagnosis not present

## 2024-05-08 DIAGNOSIS — J9611 Chronic respiratory failure with hypoxia: Secondary | ICD-10-CM | POA: Diagnosis not present

## 2024-05-10 DIAGNOSIS — E041 Nontoxic single thyroid nodule: Secondary | ICD-10-CM | POA: Diagnosis not present

## 2024-05-10 DIAGNOSIS — E039 Hypothyroidism, unspecified: Secondary | ICD-10-CM | POA: Diagnosis not present

## 2024-05-10 DIAGNOSIS — R1013 Epigastric pain: Secondary | ICD-10-CM | POA: Diagnosis not present

## 2024-05-10 DIAGNOSIS — K219 Gastro-esophageal reflux disease without esophagitis: Secondary | ICD-10-CM | POA: Diagnosis not present

## 2024-05-10 DIAGNOSIS — M069 Rheumatoid arthritis, unspecified: Secondary | ICD-10-CM | POA: Diagnosis not present

## 2024-05-10 DIAGNOSIS — R1310 Dysphagia, unspecified: Secondary | ICD-10-CM | POA: Diagnosis not present

## 2024-05-10 LAB — LAB REPORT - SCANNED: EGFR (Non-African Amer.): 96.4

## 2024-05-14 ENCOUNTER — Other Ambulatory Visit: Payer: Self-pay | Admitting: Physician Assistant

## 2024-05-14 DIAGNOSIS — L97921 Non-pressure chronic ulcer of unspecified part of left lower leg limited to breakdown of skin: Secondary | ICD-10-CM

## 2024-05-14 NOTE — Telephone Encounter (Signed)
 Last Fill: 02/19/2024  Labs: 11/27/2023 Glucose is elevated.  Triglycerides are elevated and HDL is low.  CBC normal   Next Visit: 05/20/2024  Last Visit: 12/19/2023  DX: Rheumatoid arthritis of multiple sites with negative rheumatoid factor   Current Dose per office note 12/19/2023: minocycline  100 mg twice daily   Patient to update labs at upcoming appointment on 05/20/2024  Okay to refill Minocycline ?

## 2024-05-15 ENCOUNTER — Other Ambulatory Visit: Payer: Self-pay

## 2024-05-15 ENCOUNTER — Ambulatory Visit
Admission: RE | Admit: 2024-05-15 | Discharge: 2024-05-15 | Disposition: A | Discharge status: Home / Self Care | Source: Ambulatory Visit | Attending: Internal Medicine | Admitting: Internal Medicine

## 2024-05-15 ENCOUNTER — Other Ambulatory Visit (HOSPITAL_COMMUNITY): Payer: Self-pay

## 2024-05-15 DIAGNOSIS — Z1231 Encounter for screening mammogram for malignant neoplasm of breast: Secondary | ICD-10-CM

## 2024-05-20 ENCOUNTER — Encounter: Payer: Self-pay | Admitting: Physician Assistant

## 2024-05-20 ENCOUNTER — Ambulatory Visit: Attending: Physician Assistant | Admitting: Physician Assistant

## 2024-05-20 VITALS — BP 129/70 | HR 62 | Temp 97.4°F | Resp 18 | Ht 64.0 in | Wt 216.0 lb

## 2024-05-20 DIAGNOSIS — Z79899 Other long term (current) drug therapy: Secondary | ICD-10-CM

## 2024-05-20 DIAGNOSIS — L97921 Non-pressure chronic ulcer of unspecified part of left lower leg limited to breakdown of skin: Secondary | ICD-10-CM

## 2024-05-20 DIAGNOSIS — Z96653 Presence of artificial knee joint, bilateral: Secondary | ICD-10-CM

## 2024-05-20 DIAGNOSIS — Z8639 Personal history of other endocrine, nutritional and metabolic disease: Secondary | ICD-10-CM | POA: Diagnosis not present

## 2024-05-20 DIAGNOSIS — M47816 Spondylosis without myelopathy or radiculopathy, lumbar region: Secondary | ICD-10-CM | POA: Diagnosis not present

## 2024-05-20 DIAGNOSIS — M0609 Rheumatoid arthritis without rheumatoid factor, multiple sites: Secondary | ICD-10-CM

## 2024-05-20 DIAGNOSIS — M5030 Other cervical disc degeneration, unspecified cervical region: Secondary | ICD-10-CM | POA: Diagnosis not present

## 2024-05-20 DIAGNOSIS — I83893 Varicose veins of bilateral lower extremities with other complications: Secondary | ICD-10-CM

## 2024-05-20 DIAGNOSIS — Z8669 Personal history of other diseases of the nervous system and sense organs: Secondary | ICD-10-CM

## 2024-05-20 DIAGNOSIS — R600 Localized edema: Secondary | ICD-10-CM

## 2024-05-20 DIAGNOSIS — M79641 Pain in right hand: Secondary | ICD-10-CM

## 2024-05-20 DIAGNOSIS — M06322 Rheumatoid nodule, left elbow: Secondary | ICD-10-CM | POA: Diagnosis not present

## 2024-05-20 DIAGNOSIS — Z853 Personal history of malignant neoplasm of breast: Secondary | ICD-10-CM

## 2024-05-20 DIAGNOSIS — M79642 Pain in left hand: Secondary | ICD-10-CM

## 2024-05-20 DIAGNOSIS — Z8679 Personal history of other diseases of the circulatory system: Secondary | ICD-10-CM

## 2024-05-22 ENCOUNTER — Telehealth: Payer: Self-pay

## 2024-05-22 NOTE — Telephone Encounter (Signed)
 Labs received from:[facility]   Drawn on:05/10/2024  Reviewed by:Waddell Craze, PA-C  Labs drawn:CBC, CMP, Free T4, and TSH  Results:Glucose 172 TSH 5.080

## 2024-05-23 ENCOUNTER — Other Ambulatory Visit: Payer: Self-pay | Admitting: Physician Assistant

## 2024-05-23 DIAGNOSIS — L97921 Non-pressure chronic ulcer of unspecified part of left lower leg limited to breakdown of skin: Secondary | ICD-10-CM

## 2024-05-27 ENCOUNTER — Other Ambulatory Visit: Payer: Self-pay | Admitting: Physician Assistant

## 2024-05-27 ENCOUNTER — Other Ambulatory Visit: Payer: Self-pay

## 2024-05-27 ENCOUNTER — Other Ambulatory Visit (HOSPITAL_COMMUNITY): Payer: Self-pay

## 2024-05-27 ENCOUNTER — Encounter: Payer: Self-pay | Admitting: Gastroenterology

## 2024-05-27 DIAGNOSIS — L97921 Non-pressure chronic ulcer of unspecified part of left lower leg limited to breakdown of skin: Secondary | ICD-10-CM

## 2024-05-27 MED ORDER — EZETIMIBE 10 MG PO TABS
10.0000 mg | ORAL_TABLET | Freq: Every day | ORAL | 3 refills | Status: AC
  Filled 2024-05-27: qty 100, 100d supply, fill #0
  Filled 2024-08-29: qty 100, 100d supply, fill #1

## 2024-05-28 ENCOUNTER — Other Ambulatory Visit: Payer: Self-pay | Admitting: Physician Assistant

## 2024-05-28 DIAGNOSIS — L97921 Non-pressure chronic ulcer of unspecified part of left lower leg limited to breakdown of skin: Secondary | ICD-10-CM

## 2024-05-29 ENCOUNTER — Ambulatory Visit: Admitting: Gastroenterology

## 2024-05-30 MED ORDER — MINOCYCLINE HCL 100 MG PO CAPS: 100.0000 mg | ORAL_CAPSULE | Freq: Two times a day (BID) | ORAL | 2 refills | Status: DC

## 2024-05-30 NOTE — Telephone Encounter (Signed)
 Patient request that the prescription is sent to her new pharmacy: [facility] [address] In [place].   [facility] [place] [facility] her request.

## 2024-05-30 NOTE — Addendum Note (Signed)
 Addended by: YVONE SENSOR C on: 05/30/2024 08:30 AM   Modules accepted: Orders

## 2024-06-06 ENCOUNTER — Ambulatory Visit

## 2024-06-07 ENCOUNTER — Encounter: Payer: Self-pay | Admitting: Gastroenterology

## 2024-06-07 ENCOUNTER — Ambulatory Visit: Admitting: Gastroenterology

## 2024-06-07 VITALS — BP 136/66 | HR 56 | Ht 64.0 in | Wt 221.5 lb

## 2024-06-07 DIAGNOSIS — K2100 Gastro-esophageal reflux disease with esophagitis, without bleeding: Secondary | ICD-10-CM | POA: Diagnosis not present

## 2024-06-07 DIAGNOSIS — K580 Irritable bowel syndrome with diarrhea: Secondary | ICD-10-CM

## 2024-06-07 DIAGNOSIS — R1013 Epigastric pain: Secondary | ICD-10-CM

## 2024-06-07 DIAGNOSIS — D122 Benign neoplasm of ascending colon: Secondary | ICD-10-CM | POA: Diagnosis not present

## 2024-06-07 MED ORDER — SUCRALFATE 1 G PO TABS: 1.0000 g | ORAL_TABLET | Freq: Two times a day (BID) | ORAL | 5 refills | Status: AC

## 2024-06-07 NOTE — Progress Notes (Signed)
 Chief Complaint: Epigastric pain Primary GI MD: Dr. Aneita  HPI: Discussed the use of AI scribe software for clinical note transcription with the patient, who gave verbal consent to proceed.  78 year old female history of CAD s/p CABG 2005, breast cancer s/p lumpectomy and chemo, RA, chronic diarrhea, presents for evaluation of epigastric pain  She experiences pain primarily in her upper abdomen and chest, which sometimes worsens with eating.  It was also worse if she takes her medicine on an empty stomach in the morning she has tried various medications for relief, including IBgard and Mylanta, both of which provided inconsistent and temporary relief. She is currently taking pantoprazole  40 mg twice a day, but is unsure of its effectiveness. Additionally, she takes omeprazole  20 mg, though the regimen is unclear. And her list also has voquezna 20mg  once daily on it. She isn't sure what she is taking but thinks she is taking all of the medications based on the list her granddaughter typed up for her.  She experiences daily pain and has chronic diarrhea, occurring almost every day for years. Bowel movements range from two to three times daily, increasing to six times if her stomach is upset. She uses a medication of dicyclomine  when she has to go out.  She is also on chronic narcotics which did not help her diarrhea  She has difficulty swallowing, particularly when brushing her teeth, which sometimes causes choking, possibly due to a gag reflex. She also notes a change in taste following a COVID-19 infection, describing food as tasting 'like pure salt', affecting her ability to eat.   PREVIOUS GI WORKUP   Colonoscopy 06/2021 for positive hemocult - Two 8 to 10 mm polyps in the cecum, removed with a cold snare. Resected and retrieved.  - One 12 mm polyp in the ascending colon, removed with a cold snare. Resected and retrieved.  - Moderate diverticulosis in the left colon.  - Internal  hemorrhoids.  - The examination was otherwise normal on direct and retroflexion views. Random biopsies.  FINAL MICROSCOPIC DIAGNOSIS:   A. CECUM, POLYP, POLYPECTOMY:  - Multiple fragments of tubular adenoma.  - Negative for high grade dysplasia and malignancy.   B. ASCENDING COLON, POLYP, POLYPECTOMY:  - Multiple fragments of tubular adenoma.  - Negative for high grade dysplasia and malignancy.   C. RANDOM COLON, BIOPSY:  - Benign colonic mucosa with no significant histopathologic change.  - Negative for features of microscopic colitis.  - Negative for dysplasia and malignancy.   Colonoscopy in Jan 2019 showed one tubular adenoma and moderate left colon diverticulosis.  Random colon biopsies to evaluate her diarrhea were negative.  EGD in Jan 2019 was normal.   Past Medical History:  Diagnosis Date   Actinic keratosis    Anxiety    Asthma    Albuterol  inhaler as needed.Pulmicort  neb as needed   Breast cancer (HCC)    right - lumpectomy    Chronic back pain    spinal stenosis   Chronic headaches    Colon polyps    Coronary artery disease    COVID    uses o2 since at hs   Dependence on nocturnal oxygen  therapy 07/10/2017   Pt uses 2 liters 02 at night    Depression    takes CYmbalta  and Wellbutrin  daily   Diabetes mellitus    not on any meds/controlled by diet   Dyspnea    with exertion occasionally when lies down   GERD (gastroesophageal reflux disease)  takes Omeprazole  daily   Headache    oocasionally   History of kidney stones    Hyperlipidemia    not on any meds   Hypertension    takes Benazepril ,Bystolic ,and Amlodipine  daily   IBS (irritable bowel syndrome)    Joint pain    Joint swelling    Livedoid vasculitis    Nocturia    OA (osteoarthritis) of knee    Other seborrheic keratosis    Oxygen  deficiency    2 liters at night per pt for OSA- no cpap    Peripheral edema    takes daily as needed   Peripheral neuropathy    Personal history of  chemotherapy    Personal history of radiation therapy    Pneumonia    hx of-several yrs ago   RA (rheumatoid arthritis) (HCC)    Urinary frequency    Urinary urgency    Weakness    numbness and tingling mainly in left leg occasionally in right.Tingling/numbness in hands    Past Surgical History:  Procedure Laterality Date   BIOPSY  06/24/2021   Procedure: BIOPSY;  Surgeon: Aneita Gwendlyn DASEN, MD;  Location: [facility];  Service: Endoscopy;;   BREAST LUMPECTOMY Right 1997   RIGHT BREAST   CARDIAC CATHETERIZATION  04/23/2004   EF 60%   cataract surgery Bilateral    COLONOSCOPY     COLONOSCOPY WITH PROPOFOL  N/A 08/07/2017   Procedure: COLONOSCOPY WITH PROPOFOL ;  Surgeon: Aneita Gwendlyn DASEN, MD;  Location: [facility];  Service: Endoscopy;  Laterality: N/A;   COLONOSCOPY WITH PROPOFOL  N/A 06/24/2021   Procedure: COLONOSCOPY WITH PROPOFOL ;  Surgeon: Aneita Gwendlyn DASEN, MD;  Location: [facility];  Service: Endoscopy;  Laterality: N/A;   CORONARY ARTERY BYPASS GRAFT  2005   LIMA GRAFT TO LAD, SAPHENOUS VEIN GRAFT TO THE FIRST DIAGONAL, AND LEFT RADIAL ARTERY GRAFT TO THE OM   ESOPHAGOGASTRODUODENOSCOPY (EGD) WITH PROPOFOL  N/A 08/07/2017   Procedure: ESOPHAGOGASTRODUODENOSCOPY (EGD) WITH PROPOFOL ;  Surgeon: Aneita Gwendlyn DASEN, MD;  Location: [facility];  Service: Endoscopy;  Laterality: N/A;   FOOT SURGERY Right    JOINT REPLACEMENT     LITHOTRIPSY     LUMBAR LAMINECTOMY/DECOMPRESSION MICRODISCECTOMY N/A 09/08/2015   Procedure: CENTRAL DECOMPRESSIVE LUMBAR LAMINECTOMIES L3-4, L4-5 AND BILATERAL HEMILAMINECTOMY L5-S1;  Surgeon: Lynwood FORBES Better, MD;  Location: [facility];  Service: Orthopedics;  Laterality: N/A;   nodule removed from left elbow     POLYPECTOMY  06/24/2021   Procedure: POLYPECTOMY;  Surgeon: Aneita Gwendlyn DASEN, MD;  Location: [facility];  Service: Endoscopy;;   RIGHT/LEFT HEART CATH AND CORONARY/GRAFT ANGIOGRAPHY N/A 10/06/2020   Procedure: RIGHT/LEFT HEART CATH AND CORONARY/GRAFT  ANGIOGRAPHY;  Surgeon: Jordan, Peter M, MD;  Location: [facility];  Service: Cardiovascular;  Laterality: N/A;   SHOULDER ARTHROSCOPY     TOTAL KNEE ARTHROPLASTY Bilateral    TOTAL SHOULDER ARTHROPLASTY     TRIPLE BYPASS  04/27/2005   US  ECHOCARDIOGRAPHY  01/06/2009   EF 55-60%   WRIST SURGERY Left     Current Outpatient Medications  Medication Sig Dispense Refill   ACCU-CHEK GUIDE test strip      acetaminophen  (TYLENOL ) 500 MG tablet Take 1,000 mg by mouth at bedtime.     albuterol  (VENTOLIN  HFA) 108 (90 Base) MCG/ACT inhaler Inhale 2 puffs into the lungs every 4 (four) hours as needed for wheezing or shortness of breath (cough, shortness of breath or wheezing.). 6.7 g 0   amLODipine  (NORVASC ) 5 MG tablet Take 5 mg by  mouth in the morning.     aspirin  EC 81 MG tablet Take 81 mg by mouth at bedtime. Swallow whole.     b complex vitamins capsule Take 1 capsule by mouth daily.     baclofen  (LIORESAL ) 10 MG tablet TAKE 1 TABLET BY MOUTH TWICE A DAY AS NEEDED FOR SPASMS 180 tablet 0   Bempedoic Acid  (NEXLETOL ) 180 MG TABS TAKE 1 TABLET BY MOUTH EVERY DAY 90 tablet 2   blood glucose meter kit and supplies Dispense based on patient and insurance preference. Use up to four times daily as directed. (FOR ICD-10 E10.9, E11.9). 1 each 0   buPROPion  (WELLBUTRIN  XL) 150 MG 24 hr tablet Take 150 mg by mouth in the morning.     busPIRone  (BUSPAR ) 7.5 MG tablet Take 7.5 mg by mouth 2 (two) times daily.     Calcium Carb-Cholecalciferol  (CALCIUM + D3 PO) Take 1 tablet by mouth daily.     chlorhexidine  (PERIDEX ) 0.12 % solution SWISH 1 CAP FOR 30 SECONDS THEN EXPECTORATE DO NOT SWALLOW DO NOT EAT/DRINK 2-3HRS AFTER TREATMENT     cholecalciferol  (VITAMIN D3) 25 MCG (1000 UT) tablet Take 1,000 Units by mouth at bedtime.     clobetasol  cream (TEMOVATE ) 0.05 % Apply 1 application topically daily.     Cyanocobalamin (VITAMIN B-12 PO) Take 1 capsule by mouth daily.     dicyclomine  (BENTYL ) 10 MG capsule  TAKE 1 CAPSULE (10 MG TOTAL) BY MOUTH 3 (THREE) TIMES DAILY BEFORE MEALS. 270 capsule 0   DULoxetine  (CYMBALTA ) 60 MG capsule Take 60 mg by mouth in the morning.     Echinacea 400 MG CAPS Take 400 mg by mouth daily.     ezetimibe  (ZETIA ) 10 MG tablet Take 1 tablet (10 mg total) by mouth daily. 100 tablet 3   folic acid  (FOLVITE ) 1 MG tablet Take 2 tablets (2 mg total) by mouth daily. 180 tablet 4   gabapentin  (NEURONTIN ) 300 MG capsule Take 2 capsules (600 mg total) by mouth 3 (three) times daily. 540 capsule 3   galantamine  (RAZADYNE  ER) 16 MG 24 hr capsule Take 16 mg by mouth every morning.     HYDROcodone -acetaminophen  (NORCO/VICODIN) 5-325 MG tablet Take 1 tablet by mouth daily as needed (Arthritis).     hydroxychloroquine  (PLAQUENIL ) 200 MG tablet Take 1 tablet (200 mg total) by mouth 2 (two) times daily for rheumatoid arthritis. 60 tablet 0   loperamide  (IMODIUM  A-D) 2 MG tablet Take 2 mg by mouth 4 (four) times daily as needed for diarrhea or loose stools.     meloxicam  (MOBIC ) 15 MG tablet Take 15 mg by mouth 2 (two) times a week. Wednesdays & Sundays     minocycline  (MINOCIN ) 100 MG capsule Take 1 capsule (100 mg total) by mouth 2 (two) times daily. 60 capsule 2   montelukast  (SINGULAIR ) 10 MG tablet Take 10 mg by mouth at bedtime.     Multiple Vitamins-Minerals (CENTRUM SILVER PO) Take 1 tablet by mouth daily.     Multiple Vitamins-Minerals (HAIR SKIN NAILS PO) Take 1 tablet by mouth daily.     mupirocin  ointment (BACTROBAN ) 2 % Apply 1 application topically daily.     Nebivolol  HCl 20 MG TABS Take 20 mg by mouth at bedtime.     nitroGLYCERIN  (NITROSTAT ) 0.4 MG SL tablet nitroglycerin  0.4 mg sublingual tablet 25 tablet 11   olmesartan -hydrochlorothiazide  (BENICAR  HCT) 40-25 MG tablet Take 1 tablet by mouth in the morning.  5   omeprazole  (PRILOSEC) 20  MG capsule Take 20 mg by mouth in the morning.     oxybutynin (DITROPAN-XL) 10 MG 24 hr tablet Take 10 mg by mouth daily.     OXYGEN   Inhale 2 L into the lungs at bedtime.     pantoprazole  (PROTONIX ) 40 MG tablet Take 40 mg by mouth 2 (two) times daily.     Potassium 99 MG TABS Take 99 mg by mouth daily.     Probiotic Product (PROBIOTIC DAILY PO) Take 1 capsule by mouth daily.     psyllium (REGULOID) 0.52 g capsule Take 1.04 g by mouth 2 (two) times daily.     sucralfate (CARAFATE) 1 g tablet Take 1 tablet (1 g total) by mouth 2 (two) times daily. 60 tablet 5   TART CHERRY PO Take 480 mg by mouth 2 (two) times daily.     Current Facility-Administered Medications  Medication Dose Route Frequency Provider Last Rate Last Admin   sodium chloride  flush (NS) 0.9 % injection 3 mL  3 mL Intravenous Q12H Jordan, Peter M, MD        Allergies as of 06/07/2024 - Review Complete 06/07/2024  Allergen Reaction Noted   Statins Other (See Comments) 09/03/2015   Alirocumab Other (See Comments) 01/29/2015   Aquamed  03/14/2019   Atorvastatin Other (See Comments) 09/15/2009   Colesevelam Other (See Comments) 09/15/2009   Diazepam   09/30/2019   Donepezil  08/31/2017   Dry skin treatment [albolene]  08/18/2020   Evolocumab  09/30/2014   Ezetimibe   08/18/2020   Fenofibrate micronized Other (See Comments) 09/15/2009   Fentanyl  Other (See Comments) 09/30/2019   Hydrocodone  Other (See Comments) 05/20/2022   Loratadine  Other (See Comments) 07/10/2019   Metformin  Other (See Comments) 03/14/2019   Morphine  and codeine  Other (See Comments) 02/24/2019   Niacin Other (See Comments) 09/15/2009   Phentermine-topiramate er  08/18/2020   Phentermine-topiramate er  10/12/2012   Rosuvastatin  10/25/2010   Simvastatin Other (See Comments) 09/15/2009   Azithromycin  Rash and Dermatitis 09/15/2009    Family History  Problem Relation Age of Onset   Heart disease Mother    Hyperlipidemia Mother    Lung cancer Father    Diabetes Father    Hyperlipidemia Father    Heart disease Sister    Hypertension Sister    Hyperlipidemia Sister    Heart  disease Brother    Diabetes Brother    Hypertension Brother    Hyperlipidemia Brother    Lung cancer Brother    Heart disease Brother    Hypertension Brother    Hyperlipidemia Brother    Heart disease Brother    Hypertension Brother    Hyperlipidemia Brother    Heart disease Maternal Aunt    Asthma Maternal Uncle    Heart disease Maternal Uncle    Lupus Daughter    Breast cancer Neg Hx     Social History   Socioeconomic History   Marital status: Widowed    Spouse name: Not on file   Number of children: 2   Years of education: Not on file   Highest education level: Not on file  Occupational History   Occupation: retired    Associate Professor: UNEMPLOYED  Tobacco Use   Smoking status: Never    Passive exposure: Past   Smokeless tobacco: Never  Vaping Use   Vaping status: Never Used  Substance and Sexual Activity   Alcohol  use: No    Alcohol /week: 0.0 standard drinks of alcohol    Drug use: Never  Sexual activity: Not on file  Other Topics Concern   Not on file  Social History Narrative   Not on file   Social Drivers of Health   Financial Resource Strain: Not on file  Food Insecurity: Not on file  Transportation Needs: Not on file  Physical Activity: Not on file  Stress: Not on file  Social Connections: Not on file  Intimate Partner Violence: Not on file    Review of Systems:    Constitutional: No weight loss, fever, chills, weakness or fatigue HEENT: Eyes: No change in vision               Ears, Nose, Throat:  No change in hearing or congestion Skin: No rash or itching Cardiovascular: No chest pain, chest pressure or palpitations   Respiratory: No SOB or cough Gastrointestinal: See HPI and otherwise negative Genitourinary: No dysuria or change in urinary frequency Neurological: No headache, dizziness or syncope Musculoskeletal: No new muscle or joint pain Hematologic: No bleeding or bruising Psychiatric: No history of depression or anxiety    Physical Exam:   Vital signs: BP 136/66   Pulse (!) 56   Ht 5' 4 (1.626 m)   Wt 221 lb 8 oz (100.5 kg)   BMI 38.02 kg/m   Constitutional: NAD, alert and cooperative Head:  Normocephalic and atraumatic. Eyes:   PEERL, EOMI. No icterus. Conjunctiva pink. Respiratory: Respirations even and unlabored. Lungs clear to auscultation bilaterally.   No wheezes, crackles, or rhonchi.  Cardiovascular:  Regular rate and rhythm. No peripheral edema, cyanosis or pallor.  Gastrointestinal:  Soft, nondistended, nontender. No rebound or guarding. Normal bowel sounds. No appreciable masses or hepatomegaly. Rectal:  Declines Msk:  Symmetrical without gross deformities. Without edema, no deformity or joint abnormality.  Neurologic:  Alert and  oriented x4;  grossly normal neurologically.  Skin:   Dry and intact without significant lesions or rashes. Psychiatric: Oriented to person, place and time. Demonstrates good judgement and reason without abnormal affect or behaviors.  RELEVANT LABS AND IMAGING: CBC    Component Value Date/Time   WBC 5.7 11/27/2023 1154   RBC 4.38 11/27/2023 1154   HGB 13.5 11/27/2023 1154   HGB 13.6 10/02/2020 1131   HGB 13.7 12/29/2016 1140   HGB 13.2 09/14/2007 1014   HCT 40.6 11/27/2023 1154   HCT 42.4 10/02/2020 1131   HCT 41.1 12/29/2016 1140   HCT 38.5 09/14/2007 1014   PLT 176 11/27/2023 1154   PLT 185 10/02/2020 1131   MCV 92.7 11/27/2023 1154   MCV 91 10/02/2020 1131   MCV 94 12/29/2016 1140   MCV 89.1 09/14/2007 1014   MCH 30.8 11/27/2023 1154   MCHC 33.3 11/27/2023 1154   RDW 12.9 11/27/2023 1154   RDW 13.7 10/02/2020 1131   RDW 14.0 12/29/2016 1140   RDW 14.8 (H) 09/14/2007 1014   LYMPHSABS 2,989 02/22/2023 1609   LYMPHSABS 3.0 10/02/2020 1131   LYMPHSABS 2.3 12/29/2016 1140   LYMPHSABS 2.6 09/14/2007 1014   MONOABS 0.8 03/07/2019 0300   MONOABS 0.5 09/14/2007 1014   EOSABS 80 11/27/2023 1154   EOSABS 0.1 10/02/2020 1131   EOSABS 0.1 12/29/2016 1140   BASOSABS  29 11/27/2023 1154   BASOSABS 0.0 10/02/2020 1131   BASOSABS 0.0 12/29/2016 1140   BASOSABS 0.1 09/14/2007 1014    CMP     Component Value Date/Time   NA 141 11/27/2023 1154   NA 142 11/13/2020 1146   NA 142 12/29/2016 1140   K 4.2  11/27/2023 1154   K 3.7 12/29/2016 1140   CL 102 11/27/2023 1154   CO2 28 11/27/2023 1154   CO2 29 12/29/2016 1140   GLUCOSE 203 (H) 11/27/2023 1154   GLUCOSE 146 (H) 12/29/2016 1140   BUN 15 11/27/2023 1154   BUN 18 11/13/2020 1146   BUN 16.3 12/29/2016 1140   CREATININE 0.55 (L) 11/27/2023 1154   CREATININE 0.7 12/29/2016 1140   CALCIUM 9.4 11/27/2023 1154   CALCIUM 9.9 12/29/2016 1140   PROT 6.7 11/27/2023 1154   PROT 7.2 10/02/2020 1131   PROT 7.2 12/29/2016 1140   ALBUMIN  4.6 10/02/2020 1131   ALBUMIN  4.1 12/29/2016 1140   AST 30 11/27/2023 1154   AST 27 01/04/2018 1032   AST 23 12/29/2016 1140   ALT 21 11/27/2023 1154   ALT 31 01/04/2018 1032   ALT 23 12/29/2016 1140   ALKPHOS 54 10/02/2020 1131   ALKPHOS 61 12/29/2016 1140   BILITOT 0.6 11/27/2023 1154   BILITOT 0.4 10/02/2020 1131   BILITOT 0.4 01/04/2018 1032   BILITOT 0.52 12/29/2016 1140   GFRNONAA 96.4 05/10/2024 1530   GFRNONAA 90 03/02/2020 1235   GFRAA 104 03/02/2020 1235     Assessment/Plan:   Epigastric pain Epigastric pain with eating and especially when taking pills without food.  Currently on pantoprazole  40 mg twice daily, omeprazole  20 Mg once daily, Voquenza 20 mg once daily without resolution of symptoms.  No previous EGD.  Recent labs normal.  Patient would prefer to hold off on endoscopic evaluation if possible. - Stop voquezna and omeprazole , but continue pantoprazole  40 mg twice daily - Add on Carafate twice daily (this may help with her chronic diarrhea) - Follow-up with me in 6 weeks.  If no improvement consider RUQ ultrasound to evaluate for gallstones - Also consider H. pylori stool study at follow-up appointment and if persistent symptoms could  consider EGD if patient is amenable  History of colon polyps Colonoscopy 06/2021 with 3 polyps (two were 8-29mm and one was 12mm) all tubular adenomas. Recall 3 years based on guidelines.  Due for repeat 06/2022.  Patient would like to discuss further at follow-up  Chronic diarrhea Longstanding chronic postprandial diarrhea with colonoscopy in 2019 and 2022 with biopsies negative for microscopic colitis.  Continuing to have anywhere from 3-6 stools per day overall adequate control with dicyclomine  - Continue dicyclomine   CAD s/p CABG 2005  RA  Hx of right breast cancer s/p lumpectomy and chemo  assigned to Dr. Stacia today (Friday)  Shatavia Santor Mollie RIGGERS [facility] 06/07/2024, 3:32 PM  Cc: Shayne Anes, MD

## 2024-06-07 NOTE — Patient Instructions (Addendum)
 VISIT SUMMARY:  You came in today for evaluation of your upper abdominal and chest pain, which sometimes worsens with eating. You also reported chronic diarrhea, difficulty swallowing, and a change in taste following a COVID-19 infection. We discussed your current medications and made some adjustments to help manage your symptoms.  YOUR PLAN:  -EPIGASTRIC PAIN AND GASTRITIS: Epigastric pain and gastritis refer to pain and inflammation in the upper stomach area, often worsened by certain foods and medications. We have discontinued Voquezna and will continue with pantoprazole . Additionally, we have started you on Carafate twice daily. You should take your medications with yogurt or peanut butter to help reduce irritation. Please confirm your current medication regimen.  -CHRONIC DIARRHEA: Chronic diarrhea is frequent, loose, or watery bowel movements that have been ongoing for a long time. We have started you on Carafate to help manage this condition.  -DYSPHAGIA: Dysphagia is difficulty swallowing, which in your case may be due to a gag reflex since it only occurs with brushing your teeth. We discussed proper toothbrushing techniques and encouraged you to spit regularly while brushing to help manage this issue.  INSTRUCTIONS:  Please confirm your current medication regimen. Continue taking pantoprazole  and start Carafate twice daily. Take your medications with yogurt or peanut butter. Follow the advised toothbrushing techniques to help with swallowing difficulties. If you have any questions or if your symptoms do not improve, please schedule a follow-up appointment.  We have sent the following medications to your pharmacy for you to pick up at your convenience: Carafate.   _______________________________________________________  If your blood pressure at your visit was 140/90 or greater, please contact your primary care physician to follow up on  this.  _______________________________________________________  If you are age 79 or older, your body mass index should be between 23-30. Your Body mass index is 38.02 kg/m. If this is out of the aforementioned range listed, please consider follow up with your Primary Care Provider.  If you are age 94 or younger, your body mass index should be between 19-25. Your Body mass index is 38.02 kg/m. If this is out of the aformentioned range listed, please consider follow up with your Primary Care Provider.   ________________________________________________________  The [facility] providers would like to encourage you to use MYCHART to communicate with providers for non-urgent requests or questions.  Due to long hold times on the telephone, sending your provider a message by [facility] may be a faster and more efficient way to get a response.  Please allow 48 business hours for a response.  Please remember that this is for non-urgent requests.  _______________________________________________________  [facility] is using a team-based approach to care.  Your team is made up of your doctor and two to three APPS. Our APPS (Nurse Practitioners and Physician Assistants) work with your physician to ensure care continuity for you. They are fully qualified to address your health concerns and develop a treatment plan. They communicate directly with your gastroenterologist to care for you. Seeing the Advanced Practice Practitioners on your physician's team can help you by facilitating care more promptly, often allowing for earlier appointments, access to diagnostic testing, procedures, and other specialty referrals.

## 2024-06-10 DIAGNOSIS — E785 Hyperlipidemia, unspecified: Secondary | ICD-10-CM | POA: Diagnosis not present

## 2024-06-10 DIAGNOSIS — Z1212 Encounter for screening for malignant neoplasm of rectum: Secondary | ICD-10-CM | POA: Diagnosis not present

## 2024-06-12 ENCOUNTER — Ambulatory Visit

## 2024-06-12 VITALS — BP 154/68 | HR 68 | Temp 98.1°F | Ht 64.0 in | Wt 219.6 lb

## 2024-06-12 DIAGNOSIS — G4733 Obstructive sleep apnea (adult) (pediatric): Secondary | ICD-10-CM | POA: Diagnosis not present

## 2024-06-12 DIAGNOSIS — J849 Interstitial pulmonary disease, unspecified: Secondary | ICD-10-CM

## 2024-06-12 DIAGNOSIS — R062 Wheezing: Secondary | ICD-10-CM

## 2024-06-12 DIAGNOSIS — M0600 Rheumatoid arthritis without rheumatoid factor, unspecified site: Secondary | ICD-10-CM | POA: Diagnosis not present

## 2024-06-12 DIAGNOSIS — R600 Localized edema: Secondary | ICD-10-CM | POA: Diagnosis not present

## 2024-06-12 DIAGNOSIS — R0609 Other forms of dyspnea: Secondary | ICD-10-CM

## 2024-06-12 NOTE — Patient Instructions (Signed)
  VISIT SUMMARY: Today, we discussed your worsening shortness of breath, which has been significantly impacting your mobility. We reviewed your history of severe COVID-19, rheumatoid arthritis, and previous lung scarring. We also addressed your concerns about cancer, leg swelling, and sleep apnea.  YOUR PLAN: -INTERSTITIAL LUNG DISEASE WITH PULMONARY FIBROSIS: This condition involves scarring of the lung tissue, which can make it difficult to breathe. We have ordered a chest CT scan to evaluate the lung scars and rule out cancer, and pulmonary function tests (PFTs) to assess your lung volumes and function.  -OBSTRUCTIVE SLEEP APNEA, UNTREATED: Obstructive sleep apnea is a condition where your breathing stops and starts during sleep. Your moderate sleep apnea has been untreated , which may be contributing to your breathing issues.  -RHEUMATOID ARTHRITIS: Rheumatoid arthritis is a chronic inflammatory disorder that can affect more than just your joints. It may be contributing to your interstitial lung disease.    INSTRUCTIONS: Please complete the chest CT scan and pulmonary function tests as ordered. Follow up with us  after these tests are completed so we can review the results and adjust your treatment plan accordingly.       Contains text generated by [employer].

## 2024-06-12 NOTE — Progress Notes (Signed)
 Subjective:   PATIENT ID: [name] GENDER: female DOB: Sep 30, 1945, MRN: [id number]   HPI Discussed the use of AI scribe software for clinical note transcription with the patient, who gave verbal consent to proceed.  History of Present Illness [name] is a 78 year old female with seronegative rheumatoid arthritis and a history of severe COVID-19 who presents with worsening shortness of breath.  She has experienced worsening shortness of breath over the past several months, significantly impacting her mobility. She now requires a walker for assistance and has difficulty walking short distances, needing to stop multiple times to rest. Her shortness of breath is exacerbated by physical activity and occurs at night when preparing for bed. She has a history of severe COVID-19 in 2020, which required ICU admission, and a high-resolution CT scan in 2022 showed scarring on her lungs. She is unsure if the scarring is due to COVID-19 or interstitial lung disease related to her rheumatoid arthritis.  She has a history of breast cancer, which she had at the same time as her brother had lung cancer, though she cannot recall the exact year. She is concerned about cancer due to her history and her brother's experience with coughing as a symptom of his cancer. She reports occasional chills over the past year but denies fever or night sweats. She has lost over 20 pounds, which she attributes to needing to lose weight, and mentions a second COVID-19 infection, though she is uncertain of the timing.  She experiences swelling in her legs, particularly in one leg, which she describes as having 'stuff' that looks like 'grass growing.' She describes swelling in her legs, particularly in her right leg, and mentions that her leg symptoms worsen in June each year. She also reports neuropathy and arthritis pain in her legs.  She has moderate sleep apnea but has not used CPAP due to personal preference. She was  on oxygen  following her COVID-19 hospitalization and continues to use it at night but not during the day. She has not been checking her oxygen  levels at home recently. She reports wheezing when tired or at night when lying down.     Past Medical History:  Diagnosis Date   Actinic keratosis    Anxiety    Asthma    Albuterol  inhaler as needed.Pulmicort  neb as needed   Breast cancer (HCC)    right - lumpectomy    Chronic back pain    spinal stenosis   Chronic headaches    Colon polyps    Coronary artery disease    COVID    uses o2 since at hs   Dependence on nocturnal oxygen  therapy 07/10/2017   Pt uses 2 liters 02 at night    Depression    takes CYmbalta  and Wellbutrin  daily   Diabetes mellitus    not on any meds/controlled by diet   Dyspnea    with exertion occasionally when lies down   GERD (gastroesophageal reflux disease)    takes Omeprazole  daily   Headache    oocasionally   History of kidney stones    Hyperlipidemia    not on any meds   Hypertension    takes Benazepril ,Bystolic ,and Amlodipine  daily   IBS (irritable bowel syndrome)    Joint pain    Joint swelling    Livedoid vasculitis    Nocturia    OA (osteoarthritis) of knee    Other seborrheic keratosis    Oxygen  deficiency    2 liters at  night per pt for OSA- no cpap    Peripheral edema    takes daily as needed   Peripheral neuropathy    Personal history of chemotherapy    Personal history of radiation therapy    Pneumonia    hx of-several yrs ago   RA (rheumatoid arthritis) (HCC)    Urinary frequency    Urinary urgency    Weakness    numbness and tingling mainly in left leg occasionally in right.Tingling/numbness in hands     Family History  Problem Relation Age of Onset   Heart disease Mother    Hyperlipidemia Mother    Lung cancer Father    Diabetes Father    Hyperlipidemia Father    Heart disease Sister    Hypertension Sister    Hyperlipidemia Sister    Heart disease Brother     Diabetes Brother    Hypertension Brother    Hyperlipidemia Brother    Lung cancer Brother    Heart disease Brother    Hypertension Brother    Hyperlipidemia Brother    Heart disease Brother    Hypertension Brother    Hyperlipidemia Brother    Heart disease Maternal Aunt    Asthma Maternal Uncle    Heart disease Maternal Uncle    Lupus Daughter    Breast cancer Neg Hx      Social History   Socioeconomic History   Marital status: Widowed    Spouse name: Not on file   Number of children: 2   Years of education: Not on file   Highest education level: Not on file  Occupational History   Occupation: retired    Associate Professor: UNEMPLOYED  Tobacco Use   Smoking status: Never    Passive exposure: Past   Smokeless tobacco: Never  Vaping Use   Vaping status: Never Used  Substance and Sexual Activity   Alcohol  use: No    Alcohol /week: 0.0 standard drinks of alcohol    Drug use: Never   Sexual activity: Not on file  Other Topics Concern   Not on file  Social History Narrative   Not on file   Social Drivers of Health   Financial Resource Strain: Not on file  Food Insecurity: Not on file  Transportation Needs: Not on file  Physical Activity: Not on file  Stress: Not on file  Social Connections: Not on file  Intimate Partner Violence: Not on file     Allergies  Allergen Reactions   Statins Other (See Comments)    Joint pain and swelling/burning   Alirocumab Other (See Comments)    (praluent) all sorts of side effects. strange.  she had burning, weakness, felt joints hurt more, felt asthma flared.  Other Reaction(s): all sorts of side effects. strange.  she had burning, weakness, felt joints hurt more, felt asthma flared.   Aquamed     Other Reaction(s): contact dermatitis when in covid hospital   Atorvastatin Other (See Comments)    Leg pain   Colesevelam Other (See Comments)    Leg cramps   Diazepam      makes me crazy   Donepezil     leg pains  Other Reaction(s):  leg pains   Dry Skin Treatment [Albolene]     Other reaction(s): contact dermatitis when in covid hospital   Evolocumab     (Repatha) felt like going to pass out.legs were weak.  Other Reaction(s): felt like going to pass out. legs were weak.   Ezetimibe   leg cramps   Fenofibrate Micronized Other (See Comments)    cramps   Fentanyl  Other (See Comments)    loopy   Hydrocodone  Other (See Comments)    Other reaction(s): Not available   Loratadine  Other (See Comments)    leg cramps   Metformin  Other (See Comments)    upset stomach   Morphine  And Codeine  Other (See Comments)    hallucinations   Niacin Other (See Comments)    burning   Phentermine-Topiramate Er     Other reaction(s): just didn't work for her.   Phentermine-Topiramate Er     Other Reaction(s): just didn't work for her.   Rosuvastatin     Other reaction(s): even 1/2 of 5 mg 2-3 days a week couldn't take due to muscle side effects.  Other Reaction(s): even 1/2 of 5 mg 2-3 days a week couldn't take due to muscle side effects.   Simvastatin Other (See Comments)    muscle aches   Azithromycin  Rash and Dermatitis    Legs burning     Outpatient Medications Prior to Visit  Medication Sig Dispense Refill   ACCU-CHEK GUIDE test strip      acetaminophen  (TYLENOL ) 500 MG tablet Take 1,000 mg by mouth at bedtime.     albuterol  (VENTOLIN  HFA) 108 (90 Base) MCG/ACT inhaler Inhale 2 puffs into the lungs every 4 (four) hours as needed for wheezing or shortness of breath (cough, shortness of breath or wheezing.). 6.7 g 0   amLODipine  (NORVASC ) 5 MG tablet Take 5 mg by mouth in the morning.     aspirin  EC 81 MG tablet Take 81 mg by mouth at bedtime. Swallow whole.     b complex vitamins capsule Take 1 capsule by mouth daily.     baclofen  (LIORESAL ) 10 MG tablet TAKE 1 TABLET BY MOUTH TWICE A DAY AS NEEDED FOR SPASMS 180 tablet 0   Bempedoic Acid  (NEXLETOL ) 180 MG TABS TAKE 1 TABLET BY MOUTH EVERY DAY 90 tablet 2   blood  glucose meter kit and supplies Dispense based on patient and insurance preference. Use up to four times daily as directed. (FOR ICD-10 E10.9, E11.9). 1 each 0   buPROPion  (WELLBUTRIN  XL) 150 MG 24 hr tablet Take 150 mg by mouth in the morning.     busPIRone  (BUSPAR ) 7.5 MG tablet Take 7.5 mg by mouth 2 (two) times daily.     Calcium Carb-Cholecalciferol  (CALCIUM + D3 PO) Take 1 tablet by mouth daily.     chlorhexidine  (PERIDEX ) 0.12 % solution SWISH 1 CAP FOR 30 SECONDS THEN EXPECTORATE DO NOT SWALLOW DO NOT EAT/DRINK 2-3HRS AFTER TREATMENT     cholecalciferol  (VITAMIN D3) 25 MCG (1000 UT) tablet Take 1,000 Units by mouth at bedtime.     clobetasol  cream (TEMOVATE ) 0.05 % Apply 1 application topically daily.     Cyanocobalamin (VITAMIN B-12 PO) Take 1 capsule by mouth daily.     dicyclomine  (BENTYL ) 10 MG capsule TAKE 1 CAPSULE (10 MG TOTAL) BY MOUTH 3 (THREE) TIMES DAILY BEFORE MEALS. 270 capsule 0   DULoxetine  (CYMBALTA ) 60 MG capsule Take 60 mg by mouth in the morning.     Echinacea 400 MG CAPS Take 400 mg by mouth daily.     ezetimibe  (ZETIA ) 10 MG tablet Take 1 tablet (10 mg total) by mouth daily. 100 tablet 3   folic acid  (FOLVITE ) 1 MG tablet Take 2 tablets (2 mg total) by mouth daily. 180 tablet 4   gabapentin  (NEURONTIN ) 300 MG capsule  Take 2 capsules (600 mg total) by mouth 3 (three) times daily. 540 capsule 3   galantamine  (RAZADYNE  ER) 16 MG 24 hr capsule Take 16 mg by mouth every morning.     HYDROcodone -acetaminophen  (NORCO/VICODIN) 5-325 MG tablet Take 1 tablet by mouth daily as needed (Arthritis).     hydroxychloroquine  (PLAQUENIL ) 200 MG tablet Take 1 tablet (200 mg total) by mouth 2 (two) times daily for rheumatoid arthritis. 60 tablet 0   loperamide  (IMODIUM  A-D) 2 MG tablet Take 2 mg by mouth 4 (four) times daily as needed for diarrhea or loose stools.     meloxicam  (MOBIC ) 15 MG tablet Take 15 mg by mouth 2 (two) times a week. Wednesdays & Sundays     minocycline  (MINOCIN ) 100  MG capsule Take 1 capsule (100 mg total) by mouth 2 (two) times daily. 60 capsule 2   montelukast  (SINGULAIR ) 10 MG tablet Take 10 mg by mouth at bedtime.     Multiple Vitamins-Minerals (CENTRUM SILVER PO) Take 1 tablet by mouth daily.     Multiple Vitamins-Minerals (HAIR SKIN NAILS PO) Take 1 tablet by mouth daily.     mupirocin  ointment (BACTROBAN ) 2 % Apply 1 application topically daily.     Nebivolol  HCl 20 MG TABS Take 20 mg by mouth at bedtime.     nitroGLYCERIN  (NITROSTAT ) 0.4 MG SL tablet nitroglycerin  0.4 mg sublingual tablet 25 tablet 11   olmesartan -hydrochlorothiazide  (BENICAR  HCT) 40-25 MG tablet Take 1 tablet by mouth in the morning.  5   omeprazole  (PRILOSEC) 20 MG capsule Take 20 mg by mouth in the morning.     oxybutynin (DITROPAN-XL) 10 MG 24 hr tablet Take 10 mg by mouth daily.     OXYGEN  Inhale 2 L into the lungs at bedtime.     pantoprazole  (PROTONIX ) 40 MG tablet Take 40 mg by mouth 2 (two) times daily.     Potassium 99 MG TABS Take 99 mg by mouth daily.     Probiotic Product (PROBIOTIC DAILY PO) Take 1 capsule by mouth daily.     psyllium (REGULOID) 0.52 g capsule Take 1.04 g by mouth 2 (two) times daily.     sucralfate (CARAFATE) 1 g tablet Take 1 tablet (1 g total) by mouth 2 (two) times daily. 60 tablet 5   TART CHERRY PO Take 480 mg by mouth 2 (two) times daily.     Facility-Administered Medications Prior to Visit  Medication Dose Route Frequency Provider Last Rate Last Admin   sodium chloride  flush (NS) 0.9 % injection 3 mL  3 mL Intravenous Q12H Jordan, Peter M, MD        ROS Reviewed all systems and reported negative except as above     Objective:  There were no vitals filed for this visit.  Physical Exam Physical Exam GENERAL: Appropriate to age, no acute distress. HEAD EYES EARS NOSE THROAT: Moist mucous membranes, atraumatic, normocephalic. CHEST: Clear to auscultation bilaterally, no wheezing, no crackles, no rales. CARDIAC: Regular rate and  rhythm, normal S1, normal S2, no murmurs, no rubs, no gallops. ABDOMEN: Soft, nontender. NEUROLOGICAL: Motor and sensation grossly intact, alert and oriented times X 3. EXTREMITIES: varciose veins, chronic venous stasis.      CBC    Component Value Date/Time   WBC 5.7 11/27/2023 1154   RBC 4.38 11/27/2023 1154   HGB 13.5 11/27/2023 1154   HGB 13.6 10/02/2020 1131   HGB 13.7 12/29/2016 1140   HGB 13.2 09/14/2007 1014   HCT 40.6 11/27/2023  1154   HCT 42.4 10/02/2020 1131   HCT 41.1 12/29/2016 1140   HCT 38.5 09/14/2007 1014   PLT 176 11/27/2023 1154   PLT 185 10/02/2020 1131   MCV 92.7 11/27/2023 1154   MCV 91 10/02/2020 1131   MCV 94 12/29/2016 1140   MCV 89.1 09/14/2007 1014   MCH 30.8 11/27/2023 1154   MCHC 33.3 11/27/2023 1154   RDW 12.9 11/27/2023 1154   RDW 13.7 10/02/2020 1131   RDW 14.0 12/29/2016 1140   RDW 14.8 (H) 09/14/2007 1014   LYMPHSABS 2,989 02/22/2023 1609   LYMPHSABS 3.0 10/02/2020 1131   LYMPHSABS 2.3 12/29/2016 1140   LYMPHSABS 2.6 09/14/2007 1014   MONOABS 0.8 03/07/2019 0300   MONOABS 0.5 09/14/2007 1014   EOSABS 80 11/27/2023 1154   EOSABS 0.1 10/02/2020 1131   EOSABS 0.1 12/29/2016 1140   BASOSABS 29 11/27/2023 1154   BASOSABS 0.0 10/02/2020 1131   BASOSABS 0.0 12/29/2016 1140   BASOSABS 0.1 09/14/2007 1014     Chest imaging: I reviewed her CT chest performed on April 2022 which shows peripheral reticulations with mild to moderate architectural distortion and traction bronchiectasis.  No evidence of honeycombing.  PFT:    Latest Ref Rng & Units 11/24/2020   11:01 AM  PFT Results  FVC-Pre L 2.33   FVC-Predicted Pre % 82   FVC-Post L 2.29   FVC-Predicted Post % 81   Pre FEV1/FVC % % 82   Post FEV1/FCV % % 84   FEV1-Pre L 1.92   FEV1-Predicted Pre % 91   FEV1-Post L 1.92   DLCO uncorrected ml/min/mmHg 16.19   DLCO UNC% % 84   DLCO corrected ml/min/mmHg 16.19   DLCO COR %Predicted % 84   DLVA Predicted % 99   TLC L 4.29   TLC %  Predicted % 84   RV % Predicted % 85         Assessment & Plan:   Assessment and Plan Assessment & Plan Interstitial lung disease with pulmonary fibrosis (etiology unclear: post-COVID vs rheumatoid arthritis-associated) Chronic dyspnea and wheezing, exacerbated by exertion. Previous CT showed lung scars, etiology unclear. Differential includes post-COVID sequelae vs rheumatoid arthritis-associated interstitial lung disease. No smoking history, COPD unlikely. - Ordered chest CT to evaluate lung scars and rule out cancer. - Ordered PFTs to assess lung volumes and function. - Scheduled follow-up post-tests.  Obstructive sleep apnea, untreated Moderate obstructive sleep apnea untreated due to CPAP intolerance. May contribute to breathing issues.  Rheumatoid arthritis Chronic condition potentially contributing to interstitial lung disease.  Chronic lower extremity swelling and pain, likely chronic venous insufficiency Chronic swelling and pain in lower extremities, likely due to chronic venous insufficiency. Symptoms worsen in summer.        Zola Herter, MD  Pulmonary & Critical Care Office: [phone]

## 2024-06-17 DIAGNOSIS — R82998 Other abnormal findings in urine: Secondary | ICD-10-CM | POA: Diagnosis not present

## 2024-06-19 ENCOUNTER — Other Ambulatory Visit (HOSPITAL_COMMUNITY): Payer: Self-pay

## 2024-06-19 ENCOUNTER — Other Ambulatory Visit

## 2024-06-19 MED ORDER — BUPROPION HCL ER (XL) 150 MG PO TB24
150.0000 mg | ORAL_TABLET | Freq: Every day | ORAL | 3 refills | Status: AC
  Filled 2024-06-19: qty 90, 90d supply, fill #0
  Filled 2024-08-23 – 2024-08-26 (×2): qty 90, 90d supply, fill #1

## 2024-06-20 NOTE — Progress Notes (Signed)
 Agree with the assessment and plan as outlined by Nestor Blower, PA-C.  Would recommend noninvasive assessment for H. Pylori and RUQUS prior to EGD.

## 2024-06-27 ENCOUNTER — Other Ambulatory Visit: Payer: Self-pay

## 2024-06-27 ENCOUNTER — Inpatient Hospital Stay: Admission: RE | Admit: 2024-06-27 | Discharge: 2024-06-27

## 2024-06-27 ENCOUNTER — Other Ambulatory Visit: Payer: Self-pay | Admitting: Rheumatology

## 2024-06-27 ENCOUNTER — Other Ambulatory Visit (HOSPITAL_COMMUNITY): Payer: Self-pay

## 2024-06-27 DIAGNOSIS — M0600 Rheumatoid arthritis without rheumatoid factor, unspecified site: Secondary | ICD-10-CM

## 2024-06-27 DIAGNOSIS — M0609 Rheumatoid arthritis without rheumatoid factor, multiple sites: Secondary | ICD-10-CM

## 2024-06-27 DIAGNOSIS — I7121 Aneurysm of the ascending aorta, without rupture: Secondary | ICD-10-CM | POA: Diagnosis not present

## 2024-06-27 DIAGNOSIS — J849 Interstitial pulmonary disease, unspecified: Secondary | ICD-10-CM

## 2024-06-27 MED ORDER — HYDROXYCHLOROQUINE SULFATE 200 MG PO TABS
200.0000 mg | ORAL_TABLET | Freq: Two times a day (BID) | ORAL | 0 refills | Status: AC
  Filled 2024-06-27: qty 60, 30d supply, fill #0

## 2024-06-27 NOTE — Telephone Encounter (Signed)
 Last Fill: 04/05/2024  Eye exam: 07/06/2023 WNL   Labs: 05/10/2024 Glucose 172   Next Visit: 10/23/2024  Last Visit: 05/20/2024  IK:Myzlfjunpi arthritis of multiple sites with negative rheumatoid factor   Current Dose per office note 05/20/2024: Plaquenil  200 mg 1 tablet by mouth twice daily   Okay to refill Plaquenil ?

## 2024-07-01 ENCOUNTER — Ambulatory Visit: Payer: Self-pay

## 2024-07-11 ENCOUNTER — Ambulatory Visit: Admitting: Gastroenterology

## 2024-07-15 ENCOUNTER — Other Ambulatory Visit (HOSPITAL_COMMUNITY): Payer: Self-pay

## 2024-07-26 NOTE — Progress Notes (Signed)
 [name]                                          MRN: [id number]   07/26/2024   The [facility] Quality Team Specialist reviewed this patient medical record for the purposes of chart review for care gap closure. The following were reviewed: chart review for care gap closure-controlling blood pressure.    [facility] Quality Team

## 2024-07-29 ENCOUNTER — Encounter: Payer: Self-pay | Admitting: Gastroenterology

## 2024-07-29 ENCOUNTER — Ambulatory Visit: Admitting: Gastroenterology

## 2024-07-29 VITALS — BP 136/78 | HR 60 | Ht 64.0 in | Wt 220.0 lb

## 2024-07-29 DIAGNOSIS — Z860101 Personal history of adenomatous and serrated colon polyps: Secondary | ICD-10-CM

## 2024-07-29 DIAGNOSIS — K580 Irritable bowel syndrome with diarrhea: Secondary | ICD-10-CM

## 2024-07-29 DIAGNOSIS — K2100 Gastro-esophageal reflux disease with esophagitis, without bleeding: Secondary | ICD-10-CM

## 2024-07-29 DIAGNOSIS — K529 Noninfective gastroenteritis and colitis, unspecified: Secondary | ICD-10-CM | POA: Diagnosis not present

## 2024-07-29 DIAGNOSIS — R1013 Epigastric pain: Secondary | ICD-10-CM

## 2024-07-29 DIAGNOSIS — D122 Benign neoplasm of ascending colon: Secondary | ICD-10-CM

## 2024-07-29 NOTE — Patient Instructions (Addendum)
 Your provider has ordered [employer] stool testing for you. You have received a kit from our office today containing all necessary supplies to complete this test. Please carefully read the stool collection instructions provided in the kit before opening the accompanying materials. In addition, be sure there is a label providing your full name and date of birth on the puritan opti-swab tube that is supplied in the kit (if you do not see a label with this information on your test tube, please make us  aware before test collection!). After completing the test, you should secure the purtian tube into the specimen biohazard bag. [facility] E-Req sheet (including date and time of specimen collection) should be placed into the outside pocket of the specimen biohazard bag and returned to the [place] lab (basement floor of [facility]) within 3 days of collection. Please make sure to give the specimen to a staff member at the lab. DO NOT leave the specimen on the counter.  You have been scheduled for an abdominal ultrasound at [facility] (1st floor of hospital) on 08/01/2024 at 4:00pm. Please arrive 15 minutes prior to your appointment for registration. Make certain not to have anything to eat or drink 6 hours prior to your appointment. Should you need to reschedule your appointment, please contact radiology at [phone]. This test typically takes about 30 minutes to perform.  Stop Carafate    If the specimen date and time (can be found in the upper right boxed portion of the sheet) are not filled out on the E-Req sheet, the test will NOT be performed.    VISIT SUMMARY:  Today, we discussed your persistent upper abdominal pain and chronic diarrhea. We reviewed your medication regimen and made some adjustments to help manage your symptoms. We also planned some tests to better understand the cause of your pain.  YOUR PLAN:  -EPIGASTRIC PAIN: Epigastric pain is  discomfort in the upper abdomen. We have stopped Carafate  and continued pantoprazole  at 40 mg twice daily. We have also ordered an abdominal ultrasound to check your gallbladder and liver, and a stool test to check for H. pylori infection. Please call us  with an updated list of your medications.  -CHRONIC DIARRHEA: Chronic diarrhea is frequent loose stools. You can take dicyclomine  up to four times daily as needed to manage this. We also discussed the importance of a surveillance colonoscopy due to your history of precancerous polyps, and we will revisit this at your next follow-up.  INSTRUCTIONS:  Please call us  with an updated list of your medications. We have ordered an abdominal ultrasound and a stool test for H. pylori. We will discuss the need for a surveillance colonoscopy at your next follow-up visit.

## 2024-07-29 NOTE — Progress Notes (Signed)
 "  Chief Complaint: Follow up Primary GI MD: Dr. Stacia  HPI: Discussed the use of AI scribe software for clinical note transcription with the patient, who gave verbal consent to proceed.  History of Present Illness   [name] is a 79 year old female with prior colon cancer and precancerous polyps who presents for evaluation of persistent epigastric pain.  Multiple medication adjustments have been made, including discontinuation of Voquezna and omeprazole , continuation of pantoprazole , and recent use of Carafate . Her medication regimen has been reviewed and modified by other clinicians, and her daughter and granddaughter assist with her care. She is unsure if the list we have is correct but she will call us  with an update  Persistent upper abdominal pain is localized to the epigastric region, often present and sometimes worsened by taking pills, particularly on an empty stomach. Pain is described as a rock hitting my stomach when taking medications. Symptoms are not consistently associated with eating, though she sometimes eats crackers with peanut butter before taking pills to mitigate symptoms. Currently experiencing pain, having only eaten crackers and peanut butter today. Dicyclomine  taken before meals helps reduce pain when taken about half an hour prior to eating. Thyroid  medication is taken on an empty stomach as directed. Pain is not present every day but occurs frequently, with persistent soreness much of the time. No recent abdominal imaging has been performed.  Chronic diarrhea is longstanding and not affected by medication quantity. She adjusts medication and eating habits to manage diarrhea and can anticipate when dicyclomine  is needed. No constipation or recent changes in stool quality. She previously completed a stool test but did not complete a follow-up test due to confusion with the kit.  She is hesitant to undergo repeat colonoscopy despite being due for surveillance.  No recent weight loss and weight remains stable.   PREVIOUS GI WORKUP   Colonoscopy 06/2021 for positive hemocult - Two 8 to 10 mm polyps in the cecum, removed with a cold snare. Resected and retrieved.  - One 12 mm polyp in the ascending colon, removed with a cold snare. Resected and retrieved.  - Moderate diverticulosis in the left colon.  - Internal hemorrhoids.  - The examination was otherwise normal on direct and retroflexion views. Random biopsies.   FINAL MICROSCOPIC DIAGNOSIS:   A. CECUM, POLYP, POLYPECTOMY:  - Multiple fragments of tubular adenoma.  - Negative for high grade dysplasia and malignancy.   B. ASCENDING COLON, POLYP, POLYPECTOMY:  - Multiple fragments of tubular adenoma.  - Negative for high grade dysplasia and malignancy.   C. RANDOM COLON, BIOPSY:  - Benign colonic mucosa with no significant histopathologic change.  - Negative for features of microscopic colitis.  - Negative for dysplasia and malignancy.    Colonoscopy in Jan 2019 showed one tubular adenoma and moderate left colon diverticulosis.  Random colon biopsies to evaluate her diarrhea were negative.  EGD in Jan 2019 was normal.   Past Medical History:  Diagnosis Date   Actinic keratosis    Anxiety    Asthma    Albuterol  inhaler as needed.Pulmicort  neb as needed   Breast cancer (HCC)    right - lumpectomy    Chronic back pain    spinal stenosis   Chronic headaches    Colon polyps    Coronary artery disease    COVID    uses o2 since at hs   Dependence on nocturnal oxygen  therapy 07/10/2017   Pt uses 2 liters 02 at night  Depression    takes CYmbalta  and Wellbutrin  daily   Diabetes mellitus    not on any meds/controlled by diet   Dyspnea    with exertion occasionally when lies down   GERD (gastroesophageal reflux disease)    takes Omeprazole  daily   Headache    oocasionally   History of kidney stones    Hyperlipidemia    not on any meds   Hypertension    takes  Benazepril ,Bystolic ,and Amlodipine  daily   IBS (irritable bowel syndrome)    Joint pain    Joint swelling    Livedoid vasculitis    Nocturia    OA (osteoarthritis) of knee    Other seborrheic keratosis    Oxygen  deficiency    2 liters at night per pt for OSA- no cpap    Peripheral edema    takes daily as needed   Peripheral neuropathy    Personal history of chemotherapy    Personal history of radiation therapy    Pneumonia    hx of-several yrs ago   RA (rheumatoid arthritis) (HCC)    Urinary frequency    Urinary urgency    Weakness    numbness and tingling mainly in left leg occasionally in right.Tingling/numbness in hands    Past Surgical History:  Procedure Laterality Date   BIOPSY  06/24/2021   Procedure: BIOPSY;  Surgeon: Aneita Gwendlyn DASEN, MD;  Location: [facility];  Service: Endoscopy;;   BREAST LUMPECTOMY Right 1997   RIGHT BREAST   CARDIAC CATHETERIZATION  04/23/2004   EF 60%   cataract surgery Bilateral    COLONOSCOPY     COLONOSCOPY WITH PROPOFOL  N/A 08/07/2017   Procedure: COLONOSCOPY WITH PROPOFOL ;  Surgeon: Aneita Gwendlyn DASEN, MD;  Location: [facility];  Service: Endoscopy;  Laterality: N/A;   COLONOSCOPY WITH PROPOFOL  N/A 06/24/2021   Procedure: COLONOSCOPY WITH PROPOFOL ;  Surgeon: Aneita Gwendlyn DASEN, MD;  Location: [facility];  Service: Endoscopy;  Laterality: N/A;   CORONARY ARTERY BYPASS GRAFT  2005   LIMA GRAFT TO LAD, SAPHENOUS VEIN GRAFT TO THE FIRST DIAGONAL, AND LEFT RADIAL ARTERY GRAFT TO THE OM   ESOPHAGOGASTRODUODENOSCOPY (EGD) WITH PROPOFOL  N/A 08/07/2017   Procedure: ESOPHAGOGASTRODUODENOSCOPY (EGD) WITH PROPOFOL ;  Surgeon: Aneita Gwendlyn DASEN, MD;  Location: [facility];  Service: Endoscopy;  Laterality: N/A;   FOOT SURGERY Right    JOINT REPLACEMENT     LITHOTRIPSY     LUMBAR LAMINECTOMY/DECOMPRESSION MICRODISCECTOMY N/A 09/08/2015   Procedure: CENTRAL DECOMPRESSIVE LUMBAR LAMINECTOMIES L3-4, L4-5 AND BILATERAL HEMILAMINECTOMY L5-S1;  Surgeon:  Lynwood FORBES Better, MD;  Location: [facility];  Service: Orthopedics;  Laterality: N/A;   nodule removed from left elbow     POLYPECTOMY  06/24/2021   Procedure: POLYPECTOMY;  Surgeon: Aneita Gwendlyn DASEN, MD;  Location: [facility];  Service: Endoscopy;;   RIGHT/LEFT HEART CATH AND CORONARY/GRAFT ANGIOGRAPHY N/A 10/06/2020   Procedure: RIGHT/LEFT HEART CATH AND CORONARY/GRAFT ANGIOGRAPHY;  Surgeon: Jordan, Peter M, MD;  Location: [facility] INVAS[facility] CV [facility];  Service: Cardiovascular;  Laterality: N/A;   SHOULDER ARTHROSCOPY     TOTAL KNEE ARTHROPLASTY Bilateral    TOTAL SHOULDER ARTHROPLASTY     TRIPLE BYPASS  04/27/2005   US  ECHOCARDIOGRAPHY  01/06/2009   EF 55-60%   WRIST SURGERY Left     Current Outpatient Medications  Medication Sig Dispense Refill   ACCU-CHEK GUIDE test strip      acetaminophen  (TYLENOL ) 500 MG tablet Take 1,000 mg by mouth at bedtime.     albuterol  (VENTOLIN  HFA)  108 (90 Base) MCG/ACT inhaler Inhale 2 puffs into the lungs every 4 (four) hours as needed for wheezing or shortness of breath (cough, shortness of breath or wheezing.). 6.7 g 0   amLODipine  (NORVASC ) 5 MG tablet Take 5 mg by mouth in the morning.     aspirin  EC 81 MG tablet Take 81 mg by mouth at bedtime. Swallow whole.     b complex vitamins capsule Take 1 capsule by mouth daily.     baclofen  (LIORESAL ) 10 MG tablet TAKE 1 TABLET BY MOUTH TWICE A DAY AS NEEDED FOR SPASMS 180 tablet 0   Bempedoic Acid  (NEXLETOL ) 180 MG TABS TAKE 1 TABLET BY MOUTH EVERY DAY 90 tablet 2   blood glucose meter kit and supplies Dispense based on patient and insurance preference. Use up to four times daily as directed. (FOR ICD-10 E10.9, E11.9). 1 each 0   buPROPion  (WELLBUTRIN  XL) 150 MG 24 hr tablet Take 150 mg by mouth in the morning.     buPROPion  (WELLBUTRIN  XL) 150 MG 24 hr tablet Take 1 tablet (150 mg total) by mouth daily. 90 tablet 3   busPIRone  (BUSPAR ) 7.5 MG tablet Take 7.5 mg by mouth 2 (two) times daily.     Calcium  Carb-Cholecalciferol  (CALCIUM + D3 PO) Take 1 tablet by mouth daily.     chlorhexidine  (PERIDEX ) 0.12 % solution SWISH 1 CAP FOR 30 SECONDS THEN EXPECTORATE DO NOT SWALLOW DO NOT EAT/DRINK 2-3HRS AFTER TREATMENT     cholecalciferol  (VITAMIN D3) 25 MCG (1000 UT) tablet Take 1,000 Units by mouth at bedtime.     clobetasol cream (TEMOVATE) 0.05 % Apply 1 application topically daily.     Cyanocobalamin (VITAMIN B-12 PO) Take 1 capsule by mouth daily.     dicyclomine  (BENTYL ) 10 MG capsule TAKE 1 CAPSULE (10 MG TOTAL) BY MOUTH 3 (THREE) TIMES DAILY BEFORE MEALS. 270 capsule 0   DULoxetine  (CYMBALTA ) 60 MG capsule Take 60 mg by mouth in the morning.     Echinacea 400 MG CAPS Take 400 mg by mouth daily.     ezetimibe  (ZETIA ) 10 MG tablet Take 1 tablet (10 mg total) by mouth daily. 100 tablet 3   folic acid  (FOLVITE ) 1 MG tablet Take 2 tablets (2 mg total) by mouth daily. 180 tablet 4   gabapentin  (NEURONTIN ) 300 MG capsule Take 2 capsules (600 mg total) by mouth 3 (three) times daily. 540 capsule 3   galantamine  (RAZADYNE  ER) 16 MG 24 hr capsule Take 16 mg by mouth every morning.     HYDROcodone -acetaminophen  (NORCO/VICODIN) 5-325 MG tablet Take 1 tablet by mouth daily as needed (Arthritis).     hydroxychloroquine  (PLAQUENIL ) 200 MG tablet Take 1 tablet (200 mg total) by mouth 2 (two) times daily for rheumatoid arthritis. 60 tablet 0   loperamide  (IMODIUM  A-D) 2 MG tablet Take 2 mg by mouth 4 (four) times daily as needed for diarrhea or loose stools.     meloxicam  (MOBIC ) 15 MG tablet Take 15 mg by mouth 2 (two) times a week. Wednesdays & Sundays     minocycline  (MINOCIN ) 100 MG capsule Take 1 capsule (100 mg total) by mouth 2 (two) times daily. 60 capsule 2   montelukast (SINGULAIR) 10 MG tablet Take 10 mg by mouth at bedtime.     Multiple Vitamins-Minerals (CENTRUM SILVER PO) Take 1 tablet by mouth daily.     Multiple Vitamins-Minerals (HAIR SKIN NAILS PO) Take 1 tablet by mouth daily.     mupirocin   ointment (BACTROBAN ) 2 % Apply 1 application topically daily.     Nebivolol  HCl 20 MG TABS Take 20 mg by mouth at bedtime.     nitroGLYCERIN  (NITROSTAT ) 0.4 MG SL tablet nitroglycerin  0.4 mg sublingual tablet 25 tablet 11   olmesartan -hydrochlorothiazide  (BENICAR  HCT) 40-25 MG tablet Take 1 tablet by mouth in the morning.  5   omeprazole  (PRILOSEC) 20 MG capsule Take 20 mg by mouth in the morning.     oxybutynin (DITROPAN-XL) 10 MG 24 hr tablet Take 10 mg by mouth daily.     OXYGEN  Inhale 2 L into the lungs at bedtime.     pantoprazole  (PROTONIX ) 40 MG tablet Take 40 mg by mouth 2 (two) times daily.     Potassium 99 MG TABS Take 99 mg by mouth daily.     Probiotic Product (PROBIOTIC DAILY PO) Take 1 capsule by mouth daily.     psyllium (REGULOID) 0.52 g capsule Take 1.04 g by mouth 2 (two) times daily.     sucralfate  (CARAFATE ) 1 g tablet Take 1 tablet (1 g total) by mouth 2 (two) times daily. 60 tablet 5   TART CHERRY PO Take 480 mg by mouth 2 (two) times daily.     Current Facility-Administered Medications  Medication Dose Route Frequency Provider Last Rate Last Admin   sodium chloride  flush (NS) 0.9 % injection 3 mL  3 mL Intravenous Q12H Jordan, Peter M, MD        Allergies as of 07/29/2024 - Review Complete 07/29/2024  Allergen Reaction Noted   Statins Other (See Comments) 09/03/2015   Alirocumab Other (See Comments) 01/29/2015   Aquamed  03/14/2019   Atorvastatin Other (See Comments) 09/15/2009   Colesevelam Other (See Comments) 09/15/2009   Diazepam   09/30/2019   Donepezil  08/31/2017   Dry skin treatment [albolene]  08/18/2020   Evolocumab  09/30/2014   Ezetimibe   08/18/2020   Fenofibrate micronized Other (See Comments) 09/15/2009   Fentanyl  Other (See Comments) 09/30/2019   Hydrocodone  Other (See Comments) 05/20/2022   Loratadine  Other (See Comments) 07/10/2019   Metformin  Other (See Comments) 03/14/2019   Morphine  and codeine  Other (See Comments) 02/24/2019   Niacin  Other (See Comments) 09/15/2009   Phentermine-topiramate er  08/18/2020   Phentermine-topiramate er  10/12/2012   Rosuvastatin  10/25/2010   Simvastatin Other (See Comments) 09/15/2009   Azithromycin  Rash and Dermatitis 09/15/2009    Family History  Problem Relation Age of Onset   Heart disease Mother    Hyperlipidemia Mother    Lung cancer Father    Diabetes Father    Hyperlipidemia Father    Heart disease Sister    Hypertension Sister    Hyperlipidemia Sister    Heart disease Brother    Diabetes Brother    Hypertension Brother    Hyperlipidemia Brother    Lung cancer Brother    Heart disease Brother    Hypertension Brother    Hyperlipidemia Brother    Heart disease Brother    Hypertension Brother    Hyperlipidemia Brother    Heart disease Maternal Aunt    Asthma Maternal Uncle    Heart disease Maternal Uncle    Lupus Daughter    Breast cancer Neg Hx     Social History   Socioeconomic History   Marital status: Widowed    Spouse name: Not on file   Number of children: 2   Years of education: Not on file   Highest education level: Not on file  Occupational History   Occupation: retired    Associate Professor: UNEMPLOYED  Tobacco Use   Smoking status: Never    Passive exposure: Past   Smokeless tobacco: Never  Vaping Use   Vaping status: Never Used  Substance and Sexual Activity   Alcohol  use: No    Alcohol /week: 0.0 standard drinks of alcohol    Drug use: Never   Sexual activity: Not on file  Other Topics Concern   Not on file  Social History Narrative   Not on file   Social Drivers of Health   Tobacco Use: Low Risk (07/29/2024)   Patient History    Smoking Tobacco Use: Never    Smokeless Tobacco Use: Never    Passive Exposure: Past  Financial Resource Strain: Not on file  Food Insecurity: Not on file  Transportation Needs: Not on file  Physical Activity: Not on file  Stress: Not on file  Social Connections: Not on file  Intimate Partner Violence: Not on  file  Depression (PHQ2-9): Low Risk (02/21/2022)   Depression (PHQ2-9)    PHQ-2 Score: 2  Alcohol  Screen: Not on file  Housing: Not on file  Utilities: Not on file  Health Literacy: Not on file    Review of Systems:    Constitutional: No weight loss, fever, chills, weakness or fatigue HEENT: Eyes: No change in vision               Ears, Nose, Throat:  No change in hearing or congestion Skin: No rash or itching Cardiovascular: No chest pain, chest pressure or palpitations   Respiratory: No SOB or cough Gastrointestinal: See HPI and otherwise negative Genitourinary: No dysuria or change in urinary frequency Neurological: No headache, dizziness or syncope Musculoskeletal: No new muscle or joint pain Hematologic: No bleeding or bruising Psychiatric: No history of depression or anxiety    Physical Exam:  Vital signs: BP 136/78   Pulse 60   Ht 5' 4 (1.626 m)   Wt 220 lb (99.8 kg)   SpO2 97%   BMI 37.76 kg/m   Constitutional: NAD, alert and cooperative Head:  Normocephalic and atraumatic. Eyes:   PEERL, EOMI. No icterus. Conjunctiva pink. Respiratory: Respirations even and unlabored. Lungs clear to auscultation bilaterally.   No wheezes, crackles, or rhonchi.  Cardiovascular:  Regular rate and rhythm. No peripheral edema, cyanosis or pallor.  Gastrointestinal:  Soft, nondistended, nontender. No rebound or guarding. Normal bowel sounds. No appreciable masses or hepatomegaly. Rectal:  Declines Msk:  Symmetrical without gross deformities. Without edema, no deformity or joint abnormality.  Neurologic:  Alert and  oriented x4;  grossly normal neurologically.  Skin:   Dry and intact without significant lesions or rashes. Psychiatric: Oriented to person, place and time. Demonstrates good judgement and reason without abnormal affect or behaviors.  Physical Exam    RELEVANT LABS AND IMAGING: CBC    Component Value Date/Time   WBC 5.7 11/27/2023 1154   RBC 4.38 11/27/2023 1154    HGB 13.5 11/27/2023 1154   HGB 13.6 10/02/2020 1131   HGB 13.7 12/29/2016 1140   HGB 13.2 09/14/2007 1014   HCT 40.6 11/27/2023 1154   HCT 42.4 10/02/2020 1131   HCT 41.1 12/29/2016 1140   HCT 38.5 09/14/2007 1014   PLT 176 11/27/2023 1154   PLT 185 10/02/2020 1131   MCV 92.7 11/27/2023 1154   MCV 91 10/02/2020 1131   MCV 94 12/29/2016 1140   MCV 89.1 09/14/2007 1014   MCH 30.8 11/27/2023 1154   MCHC  33.3 11/27/2023 1154   RDW 12.9 11/27/2023 1154   RDW 13.7 10/02/2020 1131   RDW 14.0 12/29/2016 1140   RDW 14.8 (H) 09/14/2007 1014   LYMPHSABS 2,989 02/22/2023 1609   LYMPHSABS 3.0 10/02/2020 1131   LYMPHSABS 2.3 12/29/2016 1140   LYMPHSABS 2.6 09/14/2007 1014   MONOABS 0.8 03/07/2019 0300   MONOABS 0.5 09/14/2007 1014   EOSABS 80 11/27/2023 1154   EOSABS 0.1 10/02/2020 1131   EOSABS 0.1 12/29/2016 1140   BASOSABS 29 11/27/2023 1154   BASOSABS 0.0 10/02/2020 1131   BASOSABS 0.0 12/29/2016 1140   BASOSABS 0.1 09/14/2007 1014    CMP     Component Value Date/Time   NA 141 11/27/2023 1154   NA 142 11/13/2020 1146   NA 142 12/29/2016 1140   K 4.2 11/27/2023 1154   K 3.7 12/29/2016 1140   CL 102 11/27/2023 1154   CO2 28 11/27/2023 1154   CO2 29 12/29/2016 1140   GLUCOSE 203 (H) 11/27/2023 1154   GLUCOSE 146 (H) 12/29/2016 1140   BUN 15 11/27/2023 1154   BUN 18 11/13/2020 1146   BUN 16.3 12/29/2016 1140   CREATININE 0.55 (L) 11/27/2023 1154   CREATININE 0.7 12/29/2016 1140   CALCIUM 9.4 11/27/2023 1154   CALCIUM 9.9 12/29/2016 1140   PROT 6.7 11/27/2023 1154   PROT 7.2 10/02/2020 1131   PROT 7.2 12/29/2016 1140   ALBUMIN  4.6 10/02/2020 1131   ALBUMIN  4.1 12/29/2016 1140   AST 30 11/27/2023 1154   AST 27 01/04/2018 1032   AST 23 12/29/2016 1140   ALT 21 11/27/2023 1154   ALT 31 01/04/2018 1032   ALT 23 12/29/2016 1140   ALKPHOS 54 10/02/2020 1131   ALKPHOS 61 12/29/2016 1140   BILITOT 0.6 11/27/2023 1154   BILITOT 0.4 10/02/2020 1131   BILITOT 0.4  01/04/2018 1032   BILITOT 0.52 12/29/2016 1140   GFRNONAA 96.4 05/10/2024 1530   GFRNONAA 90 03/02/2020 1235   GFRAA 104 03/02/2020 1235     Assessment/Plan:   Epigastric pain Epigastric pain especially with taking pills without food, sometimes triggered by eating and sometimes not affected with food. No improvement with carafate .  No improvement with previously trialed Voquezna.  Recent labs normal.  No previous EGD. - Stop Carafate  - Continue pantoprazole  40 mg twice daily (have patient check her med list at home and call us  back to ensure she is taking this medication while she was confused today) - H. pylori stool Diatherix which can be done while on PPI - US  abdomen complete to rule out gallstones - Can continue dicyclomine  up to 4 times a day as it seems to help  History of colon polyps Colonoscopy 06/2021 with 3 polyps (two were 8-39mm and one was 12mm) all tubular adenomas. Recall 3 years based on guidelines.  Due for repeat 06/2022.  Patient would like to discuss further at follow-up, she would prefer to hold off at this time   Chronic diarrhea Longstanding chronic postprandial diarrhea with colonoscopy in 2019 and 2022 with biopsies negative for microscopic colitis.  Continuing to have anywhere from 3-6 stools per day overall adequate control with dicyclomine  - Continue dicyclomine    CAD s/p CABG 2005   RA   Hx of right breast cancer s/p lumpectomy and chemo   Shaine Mount Mollie RIGGERS [facility] 07/29/2024, 4:01 PM  Cc: Shayne Anes, MD "

## 2024-08-01 ENCOUNTER — Ambulatory Visit (HOSPITAL_COMMUNITY)
Admission: RE | Admit: 2024-08-01 | Discharge: 2024-08-01 | Disposition: A | Discharge status: Home / Self Care | Source: Ambulatory Visit | Attending: Gastroenterology | Admitting: Gastroenterology

## 2024-08-01 DIAGNOSIS — R1013 Epigastric pain: Secondary | ICD-10-CM | POA: Diagnosis present

## 2024-08-05 ENCOUNTER — Ambulatory Visit: Payer: Self-pay | Admitting: Gastroenterology

## 2024-08-06 NOTE — Progress Notes (Signed)
 Agree with the assessment and plan as outlined by Nestor Blower, PA-C.  Deferring colonoscopy not unreasonable given her age and numerous comorbidities

## 2024-08-09 ENCOUNTER — Telehealth: Payer: Self-pay | Admitting: Gastroenterology

## 2024-08-09 ENCOUNTER — Other Ambulatory Visit: Payer: Self-pay

## 2024-08-09 ENCOUNTER — Other Ambulatory Visit (HOSPITAL_COMMUNITY): Payer: Self-pay

## 2024-08-09 MED ORDER — DULOXETINE HCL 60 MG PO CPEP
60.0000 mg | ORAL_CAPSULE | Freq: Every day | ORAL | 3 refills | Status: AC
  Filled 2024-08-09: qty 90, 90d supply, fill #0

## 2024-08-09 MED ORDER — OLMESARTAN MEDOXOMIL-HCTZ 40-25 MG PO TABS
1.0000 | ORAL_TABLET | Freq: Every day | ORAL | 3 refills | Status: AC
  Filled 2024-08-09: qty 90, 90d supply, fill #0

## 2024-08-09 NOTE — Telephone Encounter (Signed)
 H. pylori Diatherix was negative

## 2024-08-12 ENCOUNTER — Ambulatory Visit

## 2024-08-12 ENCOUNTER — Ambulatory Visit: Admitting: *Deleted

## 2024-08-12 ENCOUNTER — Telehealth: Payer: Self-pay | Admitting: Gastroenterology

## 2024-08-12 VITALS — BP 128/66 | HR 77 | Ht 64.0 in | Wt 222.0 lb

## 2024-08-12 DIAGNOSIS — J849 Interstitial pulmonary disease, unspecified: Secondary | ICD-10-CM | POA: Diagnosis not present

## 2024-08-12 DIAGNOSIS — J8410 Pulmonary fibrosis, unspecified: Secondary | ICD-10-CM | POA: Diagnosis not present

## 2024-08-12 DIAGNOSIS — G4733 Obstructive sleep apnea (adult) (pediatric): Secondary | ICD-10-CM

## 2024-08-12 DIAGNOSIS — M0600 Rheumatoid arthritis without rheumatoid factor, unspecified site: Secondary | ICD-10-CM

## 2024-08-12 LAB — PULMONARY FUNCTION TEST
DL/VA % pred: 102 %
DL/VA: 4.16 ml/min/mmHg/L
DLCO cor % pred: 81 %
DLCO cor: 15.39 ml/min/mmHg
DLCO unc % pred: 81 %
DLCO unc: 15.39 ml/min/mmHg
FEF 25-75 Post: 1.5 L/s
FEF 25-75 Pre: 1.91 L/s
FEF2575-%Change-Post: -21 %
FEF2575-%Pred-Post: 103 %
FEF2575-%Pred-Pre: 131 %
FEV1-%Change-Post: -2 %
FEV1-%Pred-Post: 84 %
FEV1-%Pred-Pre: 87 %
FEV1-Post: 1.67 L
FEV1-Pre: 1.73 L
FEV1FVC-%Change-Post: 6 %
FEV1FVC-%Pred-Pre: 109 %
FEV6-%Change-Post: -9 %
FEV6-%Pred-Post: 76 %
FEV6-%Pred-Pre: 84 %
FEV6-Post: 1.92 L
FEV6-Pre: 2.13 L
FEV6FVC-%Change-Post: 0 %
FEV6FVC-%Pred-Post: 105 %
FEV6FVC-%Pred-Pre: 105 %
FVC-%Change-Post: -8 %
FVC-%Pred-Post: 73 %
FVC-%Pred-Pre: 80 %
FVC-Post: 1.94 L
FVC-Pre: 2.13 L
Post FEV1/FVC ratio: 86 %
Post FEV6/FVC ratio: 100 %
Pre FEV1/FVC ratio: 81 %
Pre FEV6/FVC Ratio: 100 %
RV % pred: 75 %
RV: 1.8 L
TLC % pred: 86 %
TLC: 4.37 L

## 2024-08-12 NOTE — Telephone Encounter (Signed)
 PT POA, Tammy is calling to discuss some medications. PT advised someone called her last week and told her to continue Carafate . She was told to stop it 1/5. Requesting to speak to nurse to get clarity [phone]

## 2024-08-12 NOTE — Telephone Encounter (Signed)
 Left message for pts POA that pt was told at the visit on 1/5 to stop the carafate  and she was supposed to call back with an updated med list as she was confused on what meds she takes. Requested she call back with any further questions.

## 2024-08-12 NOTE — Patient Instructions (Signed)
" °  VISIT SUMMARY: During your visit, we discussed your interstitial lung disease and its management, as well as your obstructive sleep apnea. Your recent CT scan shows no progression of your lung condition, and your weight loss efforts are positively impacting your symptoms.  YOUR PLAN: -INTERSTITIAL LUNG DISEASE WITH PULMONARY FIBROSIS: Interstitial lung disease with pulmonary fibrosis is a condition where the lung tissue becomes scarred and stiff, making it difficult to breathe. Your recent CT scan shows no progression of this condition. Continue using your Symbicort inhaler, taking two puffs daily.  -OBSTRUCTIVE SLEEP APNEA: Obstructive sleep apnea is a condition where your breathing stops and starts during sleep. It is contributing to your shortness of breath and is exacerbated by your weight and lung fibrosis. Continue your weight loss efforts and elevate your bed to help alleviate symptoms.  INSTRUCTIONS: Please continue using your Symbicort inhaler as prescribed and maintain your weight loss efforts. Elevate your bed to help with your sleep apnea symptoms. Follow up with your GI doctor regarding your ongoing stomach pain for further evaluation and management.    Contains text generated by [employer].   "

## 2024-08-12 NOTE — Progress Notes (Signed)
 Full PFT performed today.

## 2024-08-12 NOTE — Patient Instructions (Signed)
 Full PFT performed today.

## 2024-08-12 NOTE — Telephone Encounter (Signed)
 Pt confused as to if she is supposed to be taking carafate . Per note 1/5 she was to stop the carafate . Pt told her POA that last week she was told to continue it. I do not see a note stating to continue it. Please advise.

## 2024-08-12 NOTE — Progress Notes (Signed)
 "   Subjective:   PATIENT ID: [name] GENDER: female DOB: 20-Jan-1946, MRN: [id number]   HPI Discussed the use of AI scribe software for clinical note transcription with the patient, who gave verbal consent to proceed.  History of Present Illness [name] is a 79 year old female with seronegative rheumatoid arthritis and residual lung fibrosis who presents for follow-up of interstitial lung disease.  The CT scan from December 2025 shows areas of ground glass, parenchymal bands, architectural distortion, and tractional bronchiectasis, which are unchanged from the scan in April 2022. She continues to use her Symbicort inhaler daily, taking two puffs.  She experiences difficulty walking distances, which she attributes to her weight and lung condition. She has lost over twenty pounds by modifying her diet, particularly avoiding ice cream at night. She also has sleep apnea and uses a bed that raises to help with breathing at night. Her wheezing typically occurs at night, especially when lying down, but she has adjusted her bed to help alleviate this symptom.  She mentions ongoing stomach pain, describing it as feeling like 'it hits rock bottom' when she takes her pills. She has been taking medication prescribed by a GI doctor, but it provides only temporary relief. No recent blood in stool, although it was checked twice due to a previous occurrence.     Past Medical History:  Diagnosis Date   Actinic keratosis    Anxiety    Asthma    Albuterol  inhaler as needed.Pulmicort  neb as needed   Breast cancer (HCC)    right - lumpectomy    Chronic back pain    spinal stenosis   Chronic headaches    Colon polyps    Coronary artery disease    COVID    uses o2 since at hs   Dependence on nocturnal oxygen  therapy 07/10/2017   Pt uses 2 liters 02 at night    Depression    takes CYmbalta  and Wellbutrin  daily   Diabetes mellitus    not on any meds/controlled by diet   Dyspnea    with  exertion occasionally when lies down   GERD (gastroesophageal reflux disease)    takes Omeprazole  daily   Headache    oocasionally   History of kidney stones    Hyperlipidemia    not on any meds   Hypertension    takes Benazepril ,Bystolic ,and Amlodipine  daily   IBS (irritable bowel syndrome)    Joint pain    Joint swelling    Livedoid vasculitis    Nocturia    OA (osteoarthritis) of knee    Other seborrheic keratosis    Oxygen  deficiency    2 liters at night per pt for OSA- no cpap    Peripheral edema    takes daily as needed   Peripheral neuropathy    Personal history of chemotherapy    Personal history of radiation therapy    Pneumonia    hx of-several yrs ago   RA (rheumatoid arthritis) (HCC)    Urinary frequency    Urinary urgency    Weakness    numbness and tingling mainly in left leg occasionally in right.Tingling/numbness in hands     Family History  Problem Relation Age of Onset   Heart disease Mother    Hyperlipidemia Mother    Lung cancer Father    Diabetes Father    Hyperlipidemia Father    Heart disease Sister    Hypertension Sister    Hyperlipidemia Sister  Heart disease Brother    Diabetes Brother    Hypertension Brother    Hyperlipidemia Brother    Lung cancer Brother    Heart disease Brother    Hypertension Brother    Hyperlipidemia Brother    Heart disease Brother    Hypertension Brother    Hyperlipidemia Brother    Heart disease Maternal Aunt    Asthma Maternal Uncle    Heart disease Maternal Uncle    Lupus Daughter    Breast cancer Neg Hx      Social History   Socioeconomic History   Marital status: Widowed    Spouse name: Not on file   Number of children: 2   Years of education: Not on file   Highest education level: Not on file  Occupational History   Occupation: retired    Associate Professor: UNEMPLOYED  Tobacco Use   Smoking status: Never    Passive exposure: Past   Smokeless tobacco: Never  Vaping Use   Vaping status: Never  Used  Substance and Sexual Activity   Alcohol  use: No    Alcohol /week: 0.0 standard drinks of alcohol    Drug use: Never   Sexual activity: Not on file  Other Topics Concern   Not on file  Social History Narrative   Not on file   Social Drivers of Health   Tobacco Use: Low Risk (08/12/2024)   Patient History    Smoking Tobacco Use: Never    Smokeless Tobacco Use: Never    Passive Exposure: Past  Financial Resource Strain: Not on file  Food Insecurity: Not on file  Transportation Needs: Not on file  Physical Activity: Not on file  Stress: Not on file  Social Connections: Not on file  Intimate Partner Violence: Not on file  Depression (PHQ2-9): Low Risk (02/21/2022)   Depression (PHQ2-9)    PHQ-2 Score: 2  Alcohol  Screen: Not on file  Housing: Not on file  Utilities: Not on file  Health Literacy: Not on file     Allergies[1]   Outpatient Medications Prior to Visit  Medication Sig Dispense Refill   acetaminophen  (TYLENOL ) 500 MG tablet Take 1,000 mg by mouth at bedtime.     amLODipine  (NORVASC ) 5 MG tablet Take 5 mg by mouth in the morning.     aspirin  EC 81 MG tablet Take 81 mg by mouth at bedtime. Swallow whole.     b complex vitamins capsule Take 1 capsule by mouth daily.     baclofen  (LIORESAL ) 10 MG tablet TAKE 1 TABLET BY MOUTH TWICE A DAY AS NEEDED FOR SPASMS 180 tablet 0   Bempedoic Acid  (NEXLETOL ) 180 MG TABS TAKE 1 TABLET BY MOUTH EVERY DAY 90 tablet 2   busPIRone  (BUSPAR ) 7.5 MG tablet Take 7.5 mg by mouth 2 (two) times daily.     cholecalciferol  (VITAMIN D3) 25 MCG (1000 UT) tablet Take 1,000 Units by mouth at bedtime.     Cyanocobalamin (VITAMIN B-12 PO) Take 1 capsule by mouth daily.     dicyclomine  (BENTYL ) 10 MG capsule TAKE 1 CAPSULE (10 MG TOTAL) BY MOUTH 3 (THREE) TIMES DAILY BEFORE MEALS. 270 capsule 0   DULoxetine  (CYMBALTA ) 60 MG capsule Take 60 mg by mouth in the morning.     DULoxetine  (CYMBALTA ) 60 MG capsule Take 1 capsule (60 mg total) by mouth  daily. 90 capsule 3   ezetimibe  (ZETIA ) 10 MG tablet Take 1 tablet (10 mg total) by mouth daily. 100 tablet 3   folic acid  (FOLVITE )  1 MG tablet Take 2 tablets (2 mg total) by mouth daily. 180 tablet 4   gabapentin  (NEURONTIN ) 300 MG capsule Take 2 capsules (600 mg total) by mouth 3 (three) times daily. 540 capsule 3   galantamine  (RAZADYNE  ER) 16 MG 24 hr capsule Take 16 mg by mouth every morning.     montelukast (SINGULAIR) 10 MG tablet Take 10 mg by mouth at bedtime.     Nebivolol  HCl 20 MG TABS Take 20 mg by mouth at bedtime.     olmesartan -hydrochlorothiazide  (BENICAR  HCT) 40-25 MG tablet Take 1 tablet by mouth in the morning.  5   olmesartan -hydrochlorothiazide  (BENICAR  HCT) 40-25 MG tablet Take 1 tablet by mouth daily for blood pressure as directed. 90 tablet 3   oxybutynin (DITROPAN-XL) 10 MG 24 hr tablet Take 10 mg by mouth daily.     pantoprazole  (PROTONIX ) 40 MG tablet Take 40 mg by mouth 2 (two) times daily.     Potassium 99 MG TABS Take 99 mg by mouth daily.     Probiotic Product (PROBIOTIC DAILY PO) Take 1 capsule by mouth daily.     sucralfate  (CARAFATE ) 1 g tablet Take 1 tablet (1 g total) by mouth 2 (two) times daily. 60 tablet 5   TART CHERRY PO Take 480 mg by mouth 2 (two) times daily.     ACCU-CHEK GUIDE test strip  (Patient not taking: Reported on 08/12/2024)     albuterol  (VENTOLIN  HFA) 108 (90 Base) MCG/ACT inhaler Inhale 2 puffs into the lungs every 4 (four) hours as needed for wheezing or shortness of breath (cough, shortness of breath or wheezing.). (Patient not taking: Reported on 08/12/2024) 6.7 g 0   blood glucose meter kit and supplies Dispense based on patient and insurance preference. Use up to four times daily as directed. (FOR ICD-10 E10.9, E11.9). (Patient not taking: Reported on 08/12/2024) 1 each 0   buPROPion  (WELLBUTRIN  XL) 150 MG 24 hr tablet Take 150 mg by mouth in the morning. (Patient not taking: Reported on 08/12/2024)     buPROPion  (WELLBUTRIN  XL) 150 MG 24  hr tablet Take 1 tablet (150 mg total) by mouth daily. (Patient not taking: Reported on 08/12/2024) 90 tablet 3   Calcium Carb-Cholecalciferol  (CALCIUM + D3 PO) Take 1 tablet by mouth daily.     chlorhexidine  (PERIDEX ) 0.12 % solution SWISH 1 CAP FOR 30 SECONDS THEN EXPECTORATE DO NOT SWALLOW DO NOT EAT/DRINK 2-3HRS AFTER TREATMENT     clobetasol cream (TEMOVATE) 0.05 % Apply 1 application topically daily. (Patient not taking: Reported on 08/12/2024)     Echinacea 400 MG CAPS Take 400 mg by mouth daily. (Patient not taking: Reported on 08/12/2024)     HYDROcodone -acetaminophen  (NORCO/VICODIN) 5-325 MG tablet Take 1 tablet by mouth daily as needed (Arthritis). (Patient not taking: Reported on 08/12/2024)     hydroxychloroquine  (PLAQUENIL ) 200 MG tablet Take 1 tablet (200 mg total) by mouth 2 (two) times daily for rheumatoid arthritis. (Patient not taking: Reported on 08/12/2024) 60 tablet 0   loperamide  (IMODIUM  A-D) 2 MG tablet Take 2 mg by mouth 4 (four) times daily as needed for diarrhea or loose stools. (Patient not taking: Reported on 08/12/2024)     meloxicam  (MOBIC ) 15 MG tablet Take 15 mg by mouth 2 (two) times a week. Wednesdays & Sundays     minocycline  (MINOCIN ) 100 MG capsule Take 1 capsule (100 mg total) by mouth 2 (two) times daily. (Patient not taking: Reported on 08/12/2024) 60 capsule 2  Multiple Vitamins-Minerals (CENTRUM SILVER PO) Take 1 tablet by mouth daily. (Patient not taking: Reported on 08/12/2024)     Multiple Vitamins-Minerals (HAIR SKIN NAILS PO) Take 1 tablet by mouth daily. (Patient not taking: Reported on 08/12/2024)     mupirocin  ointment (BACTROBAN ) 2 % Apply 1 application topically daily. (Patient not taking: Reported on 08/12/2024)     nitroGLYCERIN  (NITROSTAT ) 0.4 MG SL tablet nitroglycerin  0.4 mg sublingual tablet (Patient not taking: Reported on 08/12/2024) 25 tablet 11   omeprazole  (PRILOSEC) 20 MG capsule Take 20 mg by mouth in the morning. (Patient not taking: Reported on  08/12/2024)     OXYGEN  Inhale 2 L into the lungs at bedtime.     psyllium (REGULOID) 0.52 g capsule Take 1.04 g by mouth 2 (two) times daily. (Patient not taking: Reported on 08/12/2024)     Facility-Administered Medications Prior to Visit  Medication Dose Route Frequency Provider Last Rate Last Admin   sodium chloride  flush (NS) 0.9 % injection 3 mL  3 mL Intravenous Q12H Jordan, Peter M, MD        ROS Reviewed all systems and reported negative except as above     Objective:   Vitals:   08/12/24 1456  BP: 128/66  Pulse: 77  SpO2: 94%  Weight: 222 lb (100.7 kg)  Height: 5' 4 (1.626 m)    Physical Exam Physical Exam GENERAL: Appropriate to age, no acute distress. HEAD EYES EARS NOSE THROAT: Moist mucous membranes, atraumatic, normocephalic. CHEST: Clear to auscultation bilaterally, no wheezing, no crackles, no rales. CARDIAC: Regular rate and rhythm, normal S1, normal S2, no murmurs, no rubs, no gallops. ABDOMEN: Soft, nontender. NEUROLOGICAL: Motor and sensation grossly intact, alert and oriented times X 3. EXTREMITIES: Warm, well perfused, no edema.     CBC    Component Value Date/Time   WBC 5.7 11/27/2023 1154   RBC 4.38 11/27/2023 1154   HGB 13.5 11/27/2023 1154   HGB 13.6 10/02/2020 1131   HGB 13.7 12/29/2016 1140   HGB 13.2 09/14/2007 1014   HCT 40.6 11/27/2023 1154   HCT 42.4 10/02/2020 1131   HCT 41.1 12/29/2016 1140   HCT 38.5 09/14/2007 1014   PLT 176 11/27/2023 1154   PLT 185 10/02/2020 1131   MCV 92.7 11/27/2023 1154   MCV 91 10/02/2020 1131   MCV 94 12/29/2016 1140   MCV 89.1 09/14/2007 1014   MCH 30.8 11/27/2023 1154   MCHC 33.3 11/27/2023 1154   RDW 12.9 11/27/2023 1154   RDW 13.7 10/02/2020 1131   RDW 14.0 12/29/2016 1140   RDW 14.8 (H) 09/14/2007 1014   LYMPHSABS 2,989 02/22/2023 1609   LYMPHSABS 3.0 10/02/2020 1131   LYMPHSABS 2.3 12/29/2016 1140   LYMPHSABS 2.6 09/14/2007 1014   MONOABS 0.8 03/07/2019 0300   MONOABS 0.5 09/14/2007  1014   EOSABS 80 11/27/2023 1154   EOSABS 0.1 10/02/2020 1131   EOSABS 0.1 12/29/2016 1140   BASOSABS 29 11/27/2023 1154   BASOSABS 0.0 10/02/2020 1131   BASOSABS 0.0 12/29/2016 1140   BASOSABS 0.1 09/14/2007 1014     Results Radiology High resolution CT chest (06/2024): Ground-glass opacities, parenchymal bands, architectural distortion, and traction bronchiectasis without interval change compared to April 2022. (Independently interpreted)  Diagnostic Pulmonary function tests (08/12/2024): Stable lung volumes without change compared to prior studies.   PFT:    Latest Ref Rng & Units 08/12/2024    1:44 PM 11/24/2020   11:01 AM  PFT Results  FVC-Pre L 2.13  P  2.33   FVC-Predicted Pre % 80  P 82   FVC-Post L 1.94  P 2.29   FVC-Predicted Post % 73  P 81   Pre FEV1/FVC % % 81  P 82   Post FEV1/FCV % % 86  P 84   FEV1-Pre L 1.73  P 1.92   FEV1-Predicted Pre % 87  P 91   FEV1-Post L 1.67  P 1.92   DLCO uncorrected ml/min/mmHg 15.39  P 16.19   DLCO UNC% % 81  P 84   DLCO corrected ml/min/mmHg 15.39  P 16.19   DLCO COR %Predicted % 81  P 84   DLVA Predicted % 102  P 99   TLC L 4.37  P 4.29   TLC % Predicted % 86  P 84   RV % Predicted % 75  P 85     P Preliminary result         Assessment & Plan:   Assessment and Plan Assessment & Plan Interstitial lung disease with pulmonary fibrosis Well-managed, no progression on recent CT scan. Stable lung volumes on pulmonary function tests. Fibrosis likely secondary to severe COVID-19 infection, not related to seronegative rheumatoid arthritis. - Continue Symbicort inhaler, two puffs daily for suspected asthma with evidence of air trapping on CT imaging.  Obstructive sleep apnea Contributing to dyspnea, exacerbated by weight and lung fibrosis. Weight loss efforts beneficial. - Continue weight loss efforts.         Zola Herter, MD [facility] Office: [phone]       [1]   Allergies Allergen Reactions   Statins Other (See Comments)    Joint pain and swelling/burning   Alirocumab Other (See Comments)    (praluent) all sorts of side effects. strange.  she had burning, weakness, felt joints hurt more, felt asthma flared.  Other Reaction(s): all sorts of side effects. strange.  she had burning, weakness, felt joints hurt more, felt asthma flared.   Aquamed     Other Reaction(s): contact dermatitis when in covid hospital   Atorvastatin Other (See Comments)    Leg pain   Colesevelam Other (See Comments)    Leg cramps   Diazepam      makes me crazy   Donepezil     leg pains  Other Reaction(s): leg pains   Dry Skin Treatment [Albolene]     Other reaction(s): contact dermatitis when in covid hospital   Evolocumab     (Repatha) felt like going to pass out.legs were weak.  Other Reaction(s): felt like going to pass out. legs were weak.   Ezetimibe      leg cramps   Fenofibrate Micronized Other (See Comments)    cramps   Fentanyl  Other (See Comments)    loopy   Hydrocodone  Other (See Comments)    Other reaction(s): Not available   Loratadine  Other (See Comments)    leg cramps   Metformin  Other (See Comments)    upset stomach   Morphine  And Codeine  Other (See Comments)    hallucinations   Niacin Other (See Comments)    burning   Phentermine-Topiramate Er     Other reaction(s): just didn't work for her.   Phentermine-Topiramate Er     Other Reaction(s): just didn't work for her.   Rosuvastatin     Other reaction(s): even 1/2 of 5 mg 2-3 days a week couldn't take due to muscle side effects.  Other Reaction(s): even 1/2 of 5 mg 2-3 days a week couldn't take  due to muscle side effects.   Simvastatin Other (See Comments)    muscle aches   Azithromycin  Rash and Dermatitis    Legs burning   "

## 2024-08-16 ENCOUNTER — Encounter: Payer: Self-pay | Admitting: Gastroenterology

## 2024-08-23 ENCOUNTER — Other Ambulatory Visit: Payer: Self-pay | Admitting: Physician Assistant

## 2024-08-23 ENCOUNTER — Other Ambulatory Visit (HOSPITAL_COMMUNITY): Payer: Self-pay

## 2024-08-23 DIAGNOSIS — L97921 Non-pressure chronic ulcer of unspecified part of left lower leg limited to breakdown of skin: Secondary | ICD-10-CM

## 2024-08-23 NOTE — Telephone Encounter (Signed)
 Last Fill: 05/30/2024  Labs: 05/10/2024 Glucose 172  Next Visit: 10/23/2024  Last Visit: 05/20/2024  DX: Rheumatoid arthritis of multiple sites with negative rheumatoid factor   Current Dose per office note 05/20/2024: minocycline  100 mg twice daily.   Patient advised she is due to update her labs. Patient will come to update labs once weather permits.   Okay to refill Minocycline ?

## 2024-08-29 ENCOUNTER — Other Ambulatory Visit (HOSPITAL_COMMUNITY): Payer: Self-pay

## 2024-08-30 ENCOUNTER — Other Ambulatory Visit (HOSPITAL_COMMUNITY): Payer: Self-pay

## 2024-10-23 ENCOUNTER — Ambulatory Visit: Admitting: Rheumatology
